# Patient Record
Sex: Female | Born: 1935
Health system: Southern US, Community
[De-identification: ages and names within clinical notes are randomized; demographics above are authoritative.]

## PROBLEM LIST (undated history)

## (undated) DIAGNOSIS — I4891 Unspecified atrial fibrillation: Secondary | ICD-10-CM

## (undated) DIAGNOSIS — J449 Chronic obstructive pulmonary disease, unspecified: Secondary | ICD-10-CM

## (undated) DIAGNOSIS — E785 Hyperlipidemia, unspecified: Secondary | ICD-10-CM

## (undated) DIAGNOSIS — I517 Cardiomegaly: Secondary | ICD-10-CM

## (undated) DIAGNOSIS — I050 Rheumatic mitral stenosis: Secondary | ICD-10-CM

## (undated) DIAGNOSIS — K219 Gastro-esophageal reflux disease without esophagitis: Secondary | ICD-10-CM

## (undated) DIAGNOSIS — I700 Atherosclerosis of aorta: Secondary | ICD-10-CM

## (undated) DIAGNOSIS — T7840XA Allergy, unspecified, initial encounter: Secondary | ICD-10-CM

## (undated) DIAGNOSIS — C801 Malignant (primary) neoplasm, unspecified: Secondary | ICD-10-CM

## (undated) DIAGNOSIS — I5189 Other ill-defined heart diseases: Secondary | ICD-10-CM

## (undated) DIAGNOSIS — L4050 Arthropathic psoriasis, unspecified: Secondary | ICD-10-CM

## (undated) DIAGNOSIS — R110 Nausea: Secondary | ICD-10-CM

## (undated) DIAGNOSIS — D6869 Other thrombophilia: Secondary | ICD-10-CM

## (undated) DIAGNOSIS — I10 Essential (primary) hypertension: Secondary | ICD-10-CM

## (undated) DIAGNOSIS — J479 Bronchiectasis, uncomplicated: Secondary | ICD-10-CM

## (undated) DIAGNOSIS — I272 Pulmonary hypertension, unspecified: Secondary | ICD-10-CM

## (undated) HISTORY — DX: Allergy, unspecified, initial encounter: T78.40XA

## (undated) HISTORY — PX: PARTIAL HYSTERECTOMY: SHX80

## (undated) HISTORY — PX: CHOLECYSTECTOMY: SHX55

---

## 2006-03-07 HISTORY — PX: BREAST LUMPECTOMY: SHX2

## 2008-10-07 ENCOUNTER — Ambulatory Visit: Payer: Self-pay | Admitting: Dermatology

## 2008-10-28 ENCOUNTER — Ambulatory Visit: Payer: Self-pay | Admitting: Dermatology

## 2008-12-05 ENCOUNTER — Ambulatory Visit: Payer: Self-pay | Admitting: Dermatology

## 2010-12-14 ENCOUNTER — Ambulatory Visit: Payer: Self-pay | Admitting: Internal Medicine

## 2012-12-23 ENCOUNTER — Inpatient Hospital Stay: Payer: Self-pay | Admitting: Family Medicine

## 2012-12-23 LAB — BASIC METABOLIC PANEL
Anion Gap: 6 — ABNORMAL LOW (ref 7–16)
BUN: 23 mg/dL — ABNORMAL HIGH (ref 7–18)
Calcium, Total: 8.2 mg/dL — ABNORMAL LOW (ref 8.5–10.1)
Co2: 25 mmol/L (ref 21–32)
Creatinine: 0.73 mg/dL (ref 0.60–1.30)
EGFR (African American): >60
EGFR (Non-African Amer.): >60
Osmolality: 283 (ref 275–301)
Potassium: 3.5 mmol/L (ref 3.5–5.1)
Sodium: 140 mmol/L (ref 136–145)

## 2012-12-23 LAB — CBC
HCT: 37.1 % (ref 35.0–47.0)
HGB: 12.7 g/dL (ref 12.0–16.0)
MCH: 29.6 pg (ref 26.0–34.0)
MCV: 87 fL (ref 80–100)
Platelet: 231 10*3/uL (ref 150–440)

## 2012-12-23 LAB — TROPONIN I: Troponin-I: <0.02 ng/mL

## 2012-12-24 LAB — BASIC METABOLIC PANEL
Anion Gap: 8 (ref 7–16)
Calcium, Total: 8.2 mg/dL — ABNORMAL LOW (ref 8.5–10.1)
Chloride: 106 mmol/L (ref 98–107)
Creatinine: 0.77 mg/dL (ref 0.60–1.30)
EGFR (African American): >60
EGFR (Non-African Amer.): >60
Glucose: 95 mg/dL (ref 65–99)
Osmolality: 275 (ref 275–301)
Sodium: 137 mmol/L (ref 136–145)

## 2012-12-24 LAB — CBC WITH DIFFERENTIAL/PLATELET
Basophil #: 0 10*3/uL (ref 0.0–0.1)
Lymphocyte #: 1.5 10*3/uL (ref 1.0–3.6)
Lymphocyte %: 8.3 %
MCHC: 33.9 g/dL (ref 32.0–36.0)
Monocyte %: 10.1 %
Neutrophil #: 15.1 10*3/uL — ABNORMAL HIGH (ref 1.4–6.5)
Neutrophil %: 81 %
Platelet: 241 10*3/uL (ref 150–440)
WBC: 18.6 10*3/uL — ABNORMAL HIGH (ref 3.6–11.0)

## 2012-12-27 DIAGNOSIS — I059 Rheumatic mitral valve disease, unspecified: Secondary | ICD-10-CM

## 2012-12-27 LAB — VANCOMYCIN, TROUGH: Vancomycin, Trough: 4 ug/mL — ABNORMAL LOW (ref 10–20)

## 2012-12-27 LAB — CREATININE, SERUM
Creatinine: 0.8 mg/dL (ref 0.60–1.30)
EGFR (Non-African Amer.): >60

## 2012-12-28 LAB — CULTURE, BLOOD (SINGLE)

## 2012-12-29 LAB — WBC: WBC: 16.7 10*3/uL — ABNORMAL HIGH (ref 3.6–11.0)

## 2012-12-29 LAB — CREATININE, SERUM
EGFR (African American): >60
EGFR (Non-African Amer.): >60

## 2012-12-29 LAB — VANCOMYCIN, TROUGH: Vancomycin, Trough: 3 ug/mL — ABNORMAL LOW (ref 10–20)

## 2012-12-30 LAB — BASIC METABOLIC PANEL
BUN: 14 mg/dL (ref 7–18)
Calcium, Total: 8.2 mg/dL — ABNORMAL LOW (ref 8.5–10.1)
Chloride: 99 mmol/L (ref 98–107)
Co2: 32 mmol/L (ref 21–32)
Creatinine: 0.74 mg/dL (ref 0.60–1.30)
EGFR (African American): >60
EGFR (Non-African Amer.): >60
Glucose: 119 mg/dL — ABNORMAL HIGH (ref 65–99)
Sodium: 136 mmol/L (ref 136–145)

## 2012-12-30 LAB — CBC WITH DIFFERENTIAL/PLATELET
Basophil #: 0 10*3/uL (ref 0.0–0.1)
Basophil %: 0.3 %
Eosinophil #: 0.4 10*3/uL (ref 0.0–0.7)
Eosinophil %: 2.6 %
HCT: 29.2 % — ABNORMAL LOW (ref 35.0–47.0)
HGB: 9.9 g/dL — ABNORMAL LOW (ref 12.0–16.0)
Lymphocyte #: 1.2 10*3/uL (ref 1.0–3.6)
Lymphocyte %: 7.9 %
MCH: 29.3 pg (ref 26.0–34.0)
MCHC: 33.7 g/dL (ref 32.0–36.0)
MCV: 87 fL (ref 80–100)
Monocyte #: 1 x10 3/mm — ABNORMAL HIGH (ref 0.2–0.9)
Monocyte %: 6.9 %
Neutrophil #: 12 10*3/uL — ABNORMAL HIGH (ref 1.4–6.5)
Neutrophil %: 82.3 %
Platelet: 352 10*3/uL (ref 150–440)
RBC: 3.36 10*6/uL — ABNORMAL LOW (ref 3.80–5.20)
RDW: 13.6 % (ref 11.5–14.5)
WBC: 14.6 10*3/uL — ABNORMAL HIGH (ref 3.6–11.0)

## 2012-12-30 LAB — VANCOMYCIN, TROUGH: Vancomycin, Trough: 9 ug/mL — ABNORMAL LOW (ref 10–20)

## 2012-12-31 LAB — CREATININE, SERUM
Creatinine: 0.81 mg/dL (ref 0.60–1.30)
EGFR (Non-African Amer.): >60

## 2013-01-01 LAB — CBC WITH DIFFERENTIAL/PLATELET
Basophil %: 0.6 %
Eosinophil %: 4.6 %
Lymphocyte %: 14.7 %
MCHC: 34.3 g/dL (ref 32.0–36.0)
Monocyte %: 6.6 %
Neutrophil #: 9.5 10*3/uL — ABNORMAL HIGH (ref 1.4–6.5)
Neutrophil %: 73.5 %
Platelet: 456 10*3/uL — ABNORMAL HIGH (ref 150–440)
RDW: 14 % (ref 11.5–14.5)
WBC: 12.9 10*3/uL — ABNORMAL HIGH (ref 3.6–11.0)

## 2013-01-01 LAB — BASIC METABOLIC PANEL
Anion Gap: 5 — ABNORMAL LOW (ref 7–16)
BUN: 14 mg/dL (ref 7–18)
Calcium, Total: 8.8 mg/dL (ref 8.5–10.1)
Chloride: 103 mmol/L (ref 98–107)
Co2: 31 mmol/L (ref 21–32)
Creatinine: 0.76 mg/dL (ref 0.60–1.30)
EGFR (African American): >60
Osmolality: 278 (ref 275–301)
Sodium: 139 mmol/L (ref 136–145)

## 2013-01-01 LAB — PRO B NATRIURETIC PEPTIDE: B-Type Natriuretic Peptide: 534 pg/mL — ABNORMAL HIGH (ref 0–450)

## 2013-01-04 ENCOUNTER — Observation Stay: Payer: Self-pay | Admitting: Internal Medicine

## 2013-01-04 LAB — CK TOTAL AND CKMB (NOT AT ARMC)
CK, Total: 66 U/L (ref 21–215)
CK-MB: 1.2 ng/mL (ref 0.5–3.6)

## 2013-01-04 LAB — COMPREHENSIVE METABOLIC PANEL
Albumin: 3 g/dL — ABNORMAL LOW (ref 3.4–5.0)
Bilirubin,Total: 0.4 mg/dL (ref 0.2–1.0)
Calcium, Total: 9.2 mg/dL (ref 8.5–10.1)
Chloride: 105 mmol/L (ref 98–107)
Co2: 24 mmol/L (ref 21–32)
Creatinine: 0.72 mg/dL (ref 0.60–1.30)
Osmolality: 275 (ref 275–301)
SGOT(AST): 67 U/L — ABNORMAL HIGH (ref 15–37)
Sodium: 135 mmol/L — ABNORMAL LOW (ref 136–145)
Total Protein: 8.6 g/dL — ABNORMAL HIGH (ref 6.4–8.2)

## 2013-01-04 LAB — CBC
HCT: 40.8 % (ref 35.0–47.0)
HGB: 13.7 g/dL (ref 12.0–16.0)
MCV: 87 fL (ref 80–100)
RBC: 4.71 10*6/uL (ref 3.80–5.20)
RDW: 13.9 % (ref 11.5–14.5)
WBC: 15.3 10*3/uL — ABNORMAL HIGH (ref 3.6–11.0)

## 2013-01-05 LAB — CBC WITH DIFFERENTIAL/PLATELET
Basophil %: 0.7 %
Eosinophil %: 1.9 %
HCT: 35.8 % (ref 35.0–47.0)
HGB: 11.8 g/dL — ABNORMAL LOW (ref 12.0–16.0)
Lymphocyte #: 2.5 10*3/uL (ref 1.0–3.6)
MCH: 28.7 pg (ref 26.0–34.0)
MCHC: 33 g/dL (ref 32.0–36.0)
MCV: 87 fL (ref 80–100)
Monocyte #: 0.8 x10 3/mm (ref 0.2–0.9)
Neutrophil #: 8.6 10*3/uL — ABNORMAL HIGH (ref 1.4–6.5)
Neutrophil %: 69.8 %
RBC: 4.11 10*6/uL (ref 3.80–5.20)
WBC: 12.3 10*3/uL — ABNORMAL HIGH (ref 3.6–11.0)

## 2013-01-05 LAB — BASIC METABOLIC PANEL
Anion Gap: 8 (ref 7–16)
BUN: 25 mg/dL — ABNORMAL HIGH (ref 7–18)
Calcium, Total: 8.5 mg/dL (ref 8.5–10.1)
Chloride: 109 mmol/L — ABNORMAL HIGH (ref 98–107)
Co2: 24 mmol/L (ref 21–32)
EGFR (Non-African Amer.): >60
Potassium: 3.4 mmol/L — ABNORMAL LOW (ref 3.5–5.1)
Sodium: 141 mmol/L (ref 136–145)

## 2013-01-05 LAB — MAGNESIUM: Magnesium: 1.9 mg/dL

## 2013-01-06 LAB — CLOSTRIDIUM DIFFICILE BY PCR

## 2013-03-05 ENCOUNTER — Ambulatory Visit: Payer: Self-pay | Admitting: Internal Medicine

## 2013-08-05 LAB — HM MAMMOGRAPHY: HM MAMMO: NORMAL

## 2013-09-24 DIAGNOSIS — J479 Bronchiectasis, uncomplicated: Secondary | ICD-10-CM | POA: Insufficient documentation

## 2013-10-07 ENCOUNTER — Observation Stay: Payer: Self-pay | Admitting: Internal Medicine

## 2013-10-07 LAB — CBC
HCT: 43.9 % (ref 35.0–47.0)
HGB: 14.4 g/dL (ref 12.0–16.0)
MCH: 29.4 pg (ref 26.0–34.0)
MCHC: 32.7 g/dL (ref 32.0–36.0)
MCV: 90 fL (ref 80–100)
Platelet: 195 10*3/uL (ref 150–440)
RBC: 4.88 10*6/uL (ref 3.80–5.20)
RDW: 13.4 % (ref 11.5–14.5)
WBC: 10.8 10*3/uL (ref 3.6–11.0)

## 2013-10-07 LAB — PROTIME-INR
INR: 1
Prothrombin Time: 12.6 secs (ref 11.5–14.7)

## 2013-10-07 LAB — BASIC METABOLIC PANEL
Anion Gap: 8 (ref 7–16)
BUN: 19 mg/dL — ABNORMAL HIGH (ref 7–18)
CALCIUM: 8.9 mg/dL (ref 8.5–10.1)
Chloride: 106 mmol/L (ref 98–107)
Co2: 26 mmol/L (ref 21–32)
Creatinine: 0.69 mg/dL (ref 0.60–1.30)
Glucose: 117 mg/dL — ABNORMAL HIGH (ref 65–99)
Osmolality: 283 (ref 275–301)
POTASSIUM: 4.2 mmol/L (ref 3.5–5.1)
SODIUM: 140 mmol/L (ref 136–145)

## 2013-10-07 LAB — TROPONIN I: Troponin-I: <0.02 ng/mL

## 2013-10-08 DIAGNOSIS — R079 Chest pain, unspecified: Secondary | ICD-10-CM

## 2013-10-08 LAB — LIPID PANEL
Cholesterol: 280 mg/dL — ABNORMAL HIGH (ref 0–200)
HDL Cholesterol: 106 mg/dL — ABNORMAL HIGH (ref 40–60)
Ldl Cholesterol, Calc: 152 mg/dL — ABNORMAL HIGH (ref 0–100)
Triglycerides: 112 mg/dL (ref 0–200)
VLDL Cholesterol, Calc: 22 mg/dL (ref 5–40)

## 2013-10-09 ENCOUNTER — Encounter: Payer: Self-pay | Admitting: Cardiovascular Disease

## 2013-11-22 LAB — LIPID PANEL
CHOLESTEROL: 304 mg/dL — AB (ref 0–200)
HDL: 100 mg/dL — AB (ref 35–70)

## 2013-11-22 LAB — TSH: TSH: 3.5 u[IU]/mL (ref <=5.90)

## 2014-06-27 NOTE — H&P (Signed)
PATIENT NAMESHATARIA, [name] MR#:  [id number] DATE OF BIRTH:  06-Aug-1933  DATE OF ADMISSION:  12/23/2012  REFERRING PHYSICIAN: Dr. Benjaman Lobe.   FAMILY PHYSICIAN: Dr. Army Melia.   REASON FOR ADMISSION: Pneumonia.   HISTORY OF PRESENT ILLNESS: The patient is a 81 year old female with a history of breast cancer status post lumpectomy, benign hypertension and psoriasis. Presents to the Emergency Room with a 4 to 5 day history of worsening shortness of breath, malaise and dry nonproductive cough. Has had fevers. Was brought to the Emergency Room where she was found to be febrile and hypoxic. Chest x-ray did reveal a right middle lobe infiltrate consistent with pneumonia. She is now admitted for further evaluation.   PAST MEDICAL HISTORY: 1.  Breast cancer, status post lobectomy with adjuvant chemotherapy and radiation therapy.  2.  Environmental allergies.  3.  Psoriasis.  4.  Menopausal syndrome.   MEDICATIONS ON ADMISSION:  1.  Clonidine 0.1 mg p.o. daily.  2.  Zyrtec 10 mg p.o. daily.  3.  Flonase 2 puffs in each nostril daily.   ALLERGIES: No known drug allergies.   SOCIAL HISTORY: Negative for alcohol or tobacco abuse.   FAMILY HISTORY: Positive for breast cancer, hypertension and stroke.   REVIEW OF SYSTEMS:    CONSTITUTIONAL: The patient has had fever but no change in weight.  EYES: No blurred or double vision. No glaucoma.  EARS, NOSE, THROAT: No tinnitus or hearing loss. No nasal discharge or bleeding. No difficulty swallowing.  RESPIRATORY: The patient has had cough but no wheezing. Denies hemoptysis. No painful respiration.  CARDIOVASCULAR: No chest pain or orthopnea. No palpitations or syncope.  GASTROINTESTINAL: Some nausea but no vomiting or diarrhea. No abdominal pain. No change in bowel habits.  GENITOURINARY: No dysuria or hematuria. No incontinence.  ENDOCRINE: No polyuria or polydipsia. No heat or cold intolerance.  HEMATOLOGIC: The patient denies anemia, easy  bruising or bleeding.  LYMPHATIC: No swollen glands.  MUSCULOSKELETAL: The patient denies pain in her neck, back, shoulders, knees or hips. No gout.  NEUROLOGIC: No numbness or migraines. Denies stroke or seizures.  PSYCHIATRIC: The patient denies anxiety, insomnia or depression.   PHYSICAL EXAMINATION: GENERAL: The patient is in no acute distress.  VITAL SIGNS: Remarkable for a blood pressure of 174/72 with a heart rate of 114, a respiratory rate of 18 and temperature 98.5.  HEENT: Normocephalic, atraumatic. Pupils are equal, round and reactive to light and accommodation. Extraocular movements are intact. Sclerae are anicteric. Conjunctivae are clear. Oropharynx is dry but clear.  NECK: Supple without JVD or bruits. No adenopathy or thyromegaly is noted.  LUNGS: Revealed diffuse rhonchi with occasional wheeze. No dullness. Respiratory effort is normal.  CARDIAC: Rapid rate with a regular rhythm. Normal S1 and S2. No significant rubs, murmurs or gallops. PMI is nondisplaced. Chest wall is nontender.  ABDOMEN: Soft, nontender, with normoactive bowel sounds. No organomegaly or masses were appreciated. No hernias or bruits were noted.  EXTREMITIES: Without clubbing, cyanosis or edema. Pulses were 2+ bilaterally.  SKIN: Warm and dry without rash or lesions.  NEUROLOGIC: Cranial nerves II through XII grossly intact. Deep tendon reflexes were symmetric. Motor and sensory exams nonfocal.  PSYCHIATRIC: Revealed a patient who was alert and oriented to person, place and time. She was cooperative and used good judgment.   LABORATORY DATA: Chest x-ray revealed right middle lobe infiltrate consistent with pneumonia. EKG revealed sinus tachycardia with no acute ischemic changes. Troponin was less than 0.02. Glucose 99 with a  BUN of 23 and a creatinine of 0.73 with a sodium of 140 and potassium of 3.5. White count was 20.5 with a hemoglobin of 12.7. D-dimer was elevated at 2.66.   ASSESSMENT: 1.  Right middle  lobe pneumonia.  2.  Acute respiratory distress.  3.  Dehydration.  4.  Benign hypertension.  5.  History of breast cancer.  6.  Elevated D-dimer of unclear significance.   PLAN: The patient will be admitted to the floor with oxygen, SVNs, IV antibiotics and IV fluids. We will begin Lovenox empirically. We will attempt CT of the chest tomorrow after hydration to rule out PE and to confirm the diagnosis of pneumonia. We will use clonidine for blood pressure control. Follow up routine labs in the morning. We will supplement oxygen at this time and wean as tolerated. Soft diet for now. Vitals q.4 hours. Further treatment and evaluation will depend upon the patient's progress.   Total time spent on this patient was 50 minutes.    ____________________________ Leonie Douglas Doy Hutching, MD jds:jm D: 12/23/2012 15:18:31 ET T: 12/23/2012 15:45:16 ET JOB#: [id number]  cc: Leonie Douglas. Doy Hutching, MD, <Dictator> Halina Maidens, MD Jb Dulworth Lennice Sites MD ELECTRONICALLY SIGNED 12/24/2012 8:14

## 2014-06-27 NOTE — Discharge Summary (Signed)
PATIENT NAMESIVAN, [name] MR#:  [id number] DATE OF BIRTH:  Dec 27, 1933  DATE OF ADMISSION:  12/23/2012 DATE OF DISCHARGE:  01/01/2013  REASON FOR ADMISSION: Pneumonia.   DISCHARGE DIAGNOSES: 1. Acute respiratory failure.  2. Bacterial community-acquired pneumonia.  3. Leukocytosis with systemic inflammatory response syndrome present at admission with evidence of tachycardia, leukocytosis and tachypnea.  4. Hypertension.  5. Diastolic dysfunction with compensated diastolic heart failure. No signs of exacerbation.  6. Pulmonary hypertension.  7. History of breast cancer status post lumpectomy.  8. History of psoriasis.   9. Possible undiagnosed chronic obstructive pulmonary disease.   IMPORTANT TESTS: CT scan of the chest with contrast to evaluate for pulmonary embolism showed no signs of pulmonary embolism, but findings concerning for multifocal bilateral pneumonia with significant mediastinal adenopathy and bilateral pleural effusions.   Echocardiogram shows an ejection fraction of 55% to 60%, normal ventricular systolic function, mild LVH, moderate elevation of pulmonary artery systolic pressures.   Other labs: On admission, white blood count 20,000, discharge white blood count 12,000. Creatinine 0.33, BUN 23. Hemoglobin 10.4 at discharge, platelets 456. Legionella antigen negative. Mycoplasma antigen negative. D- dimer of 2.66.   EKG: Sinus tachycardia.  Blood cultures negative x 2. Unable to get sputum cultures on this patient.   DISPOSITION: Home with home health.   MEDICATIONS AT DISCHARGE: Flonase 50 mcg inhaled once a day, Zyrtec 10 mg once a day, clonidine 0.1 mg once daily, aspirin 81 mg once a day, budesonide with  formoterol 160 mcg 4.5 mcg twice daily, Combivent as needed for shortness of breath, amlodipine 5 mg once a day, guaifenesin 1200 mg 2 times daily, linezolid 600 mg twice daily, Levaquin 500 mg once a day, Tussionex twice daily for cough, Augmentin 1 twice daily  for 7 days, Diflucan 150 mg daily for 2 days, Nystatin topical and oral mouthwash.   Follow-up with Dr. Halina Maidens in the next 7 days.   Message for Dr. Army Melia: Please repeat a CT scan of the chest after resolution of pneumonia as the patient had findings of several mediastinal lymphadenopathies and bilateral infiltrates, just on the setting of a history of breast cancer to rule out the possibility of metastatic disease.   The patient has improved with her pneumonia, but she had a very rocky course with very slow progression, for what we want to make sure that she gets follow up on her CT scan within at least a couple of weeks to a month.   HOSPITAL COURSE:  Very nice 81 year old female who has history of previous breast cancer, environmental allergies, psoriasis, who comes in with a history of 4 or 5 days of worsening shortness of breath, malaise, dry nonproductive cough and fevers. She was febrile and hypoxic at admission, and a chest x-ray revealed right middle lobe infiltrate consistent with pneumonia for which the patient was admitted and placed on empiric antibiotics for the treatment of pneumonia. The patient also had elevated d-dimer for what a CT scan was done. As mentioned above, the CT scan showed bilateral multifocal pneumonia. The patient was very symptomatic and had a very slow progression. She was admitted on 10/19 and discharged the 10/28. Since there was no significant improvement by day 4 of her hospitalization, the antibiotic management was changed to Zosyn, vancomycin and Levaquin to cover for atypical bacteria, gram negatives and possible MRSA.   This patient was really sick with significant changes on her CT scan concerning for septic emboli, but no other source of  sepsis or the pneumonia.   Pulmonary was involved. Dr. Humphrey Rolls, who evaluated the patient, recommended to do a bronch if no significant improvement. The patient continued to improve very slowly after changing  antibiotics and by the day of discharge, 10/28, we were able to get her on room air. The patient had significant trouble with her secretions, not able to expectorate, for what we were not able to get sputum cultures. The concern in this patient is her previous history of breast cancer and the possibility of metastatic disease. The first impression of the CT scan was mostly correlation with infectious process, but we recommended follow-up CT scan in the next couple of weeks to a month to evaluate the previous changes in the lymphadenopathies. The patient will follow with Dr. Army Melia to get that done.    As far as her other medical problems, the patient had high blood pressure, for what she was continued on clonidine and amlodipine. She has history of diastolic dysfunction per echo, but no signs of pulmonary congestion or pulmonary edema. She had diastolic dysfunction, but no significant exacerbation with CHF. Everything was compensated. She also has pulmonary hypertension which is likely secondary to her lung process. Unable to say if she has COPD; she was never a smoker but she had significant allergies and maybe some asthma.   Other than that, the patient did well during this hospitalization and at the time of discharge she is in good condition, but still a little bit weak for what we recommended home health with PT. The patient is discharged in good condition.   TIME OF DISCHARGE: 45 minutes.   ____________________________ [place] Sink, MD rsg:sg D: 01/02/2013 07:06:00 ET T: 01/02/2013 07:54:36 ET JOB#: [id number]  cc: [place] Sink, MD, <Dictator> Halina Maidens, MD Cristi Loron MD ELECTRONICALLY SIGNED 01/18/2013 23:40

## 2014-06-27 NOTE — Discharge Summary (Signed)
PATIENT NAMELILLIAUNA, Murphy MR#:  [id number] DATE OF BIRTH:  21-Feb-1934  DATE OF ADMISSION:  01/04/2013 DATE OF DISCHARGE:  01/07/2013  PRESENTING COMPLAINT: Nausea, vomiting and diarrhea.   DISCHARGE DIAGNOSES: 1.  Nausea and diarrhea suspected due to multiple antibiotics, improving. 2.  Dehydration, resolved.  3.  Recent treatment for bilateral pneumonia, improved.  4.  Hypertension.   CONDITION ON DISCHARGE: Fair.   CODE STATUS: FULL CODE.   DISCHARGE MEDICATIONS: 1.  Flonase 50 mcg/inhalation 1 spray daily.  2.  Zyrtec 10 mg daily.  3.  Aspirin 81 mg daily.  4.  Combivent 2 puffs 4 times a day as needed.  5.  Amlodipine 10 mg daily.  6.  Guaifenesin 200 mg extended-release 1 tablet b.i.d. as needed.  7.  Nystatin swish and swallow 5 mL 4 times a day as needed.  8.  Symbicort 160/4.5 one puff b.i.d.  9.  Clonidine 0.1 mg p.o. daily.  10.  Promethazine 12.5 mg 3 times a day as needed for nausea.  11.  Lactobacillus 1 capsule t.i.d. for 5 to 6 days as needed.  12.  Tussionex 5 mL b.i.d.   DISCHARGE INSTRUCTIONS: Follow up with Dr. Halina Maidens, primary care physician.  DIET: Regular.   LABORATORY AND DIAGNOSTICS: C. diff negative. White count 12.3, H and H 11.8 and 35.8 and platelet count 605. BUN 25, creatinine 0.5, sodium 141 and potassium 3.4. Magnesium 1.9.   Chest x-ray shows improvement in pneumonia.   Cardiac enzymes negative.   BRIEF SUMMARY OF HOSPITAL COURSE: [name] is a 81 year old Caucasian female with history of hypertension, breast cancer and recent admission for pneumonia and possible COPD secondary to environmental allergies and maybe some asthma who came in after she was discharged 3 days ago, presented with nausea, vomiting and debility. She was admitted with:  1.  Dehydration suspected due to poor p.o. intake and secondary probably from side effects from the antibiotics. The patient received IV fluids. BUN and creatinine remained stable. Her  pneumonia showed improvement on chest x-ray, she was asymptomatic, and hence her antibiotics were discontinued. She is tolerating p.o. clear liquid diet and now she is on regular diet which she is tolerating well.  2.  Nausea. Could be secondary to side effects from antibiotics. Since the patient is stable, we stopped antibiotics. 3.  Pneumonia, bilateral, resolving.  4.  History of breast cancer. The patient had lymphadenopathy on CT scan which was done prior to admission. She will follow up with her primary care physician to ensure clearing of the lymphadenopathy down the road. We will defer this to Dr. Halina Maidens.  5.  Hypertension. Continue Norvasc and clonidine.  6.  Physical therapy. The patient did very well, does not need any PT at home.   DISPOSITION: She is discharged in a stable to home.   TIME SPENT: 40 minutes. ____________________________ Hart Rochester Posey Pronto, MD sap:sb D: 01/07/2013 14:01:22 ET T: 01/07/2013 14:31:16 ET JOB#: [id number]  cc: Monicka Cyran A. Posey Pronto, MD, <Dictator> Halina Maidens, MD Ilda Basset MD ELECTRONICALLY SIGNED 01/10/2013 13:31

## 2014-06-27 NOTE — H&P (Signed)
PATIENT NAME:  [name], [name] MR#:  [id number] DATE OF BIRTH:  03-Mar-1934  DATE OF ADMISSION:  01/04/2013  REFERRING PHYSICIAN: Francene Castle, MD  PRIMARY CARE PHYSICIAN: Halina Maidens, MD  CHIEF COMPLAINT: Weakness, nausea, vomiting, dehydration.   HISTORY OF PRESENT ILLNESS: [name] is a very nice 81 year old female who has history of previous breast cancer, environmental allergies and psoriasis came in with a history of 4 or 5 days of shortness of breath and was admitted on 12/23/2012 and discharged on 12/22/2012 with the diagnosis of acute respiratory failure and bacterial community-acquired pneumonia. The patient had a really rocky course with very difficult treatment, no improvement for 4 or 5 days, actually she was getting worse, for what her antibiotics were brought up to include MRSA coverage. We were never able to get a sputum culture on her as she was unable to expectorate for what she was treated empirically with linezolid as an outpatient. The patient had x-rays that showed right middle lobe pneumonia and a CT scan that showed bilateral multifocal pneumonia with multiple lymph nodes that could be acute or chronic. As a side note from that, there was a recommendation to follow up on the CT scan in the near future in the setting of the patient having history of breast cancer. The patient went home and she did really well on the 28th and the 29th, but then on the 30th she started getting nauseous and weak. So she comes back today because she is not feeling very well. She feels really weak. She cannot eat anything. She cannot hold anything down because of her nausea, but she vomited a couple of times. She looks dehydrated. She says that her breathing is normal now and her actual chest x-ray now looks significantly improved from the last one for what her pneumonia is pretty much resolving. She denies any significant diarrhea, but she had some loose stool last night. The patient is admitted  as an observation due to her dehydration and a possibility of having her placed in a skilled nursing facility for rehab as she feels so weak. She had home health therapy scheduled at home, but she declined to continue as the patient was walking around very well.   REVIEW OF SYSTEMS:  A 12 system review is done.  CONSTITUTIONAL: No fever. No chills. Positive fatigue. Positive weakness. No significant weight loss or weight gain.  EYES: No blurry vision, double vision.  EARS, NOSE, THROAT: No tinnitus. No difficulty swallowing.  RESPIRATORY: No cough. No wheezing. No shortness of breath. No painful respirations. Her pneumonia is pretty much resolved. CARDIOVASCULAR: No chest pain, orthopnea, edema or syncope.  GASTROINTESTINAL: Positive nausea. Positive vomiting, once or twice. No abdominal pain. No hematemesis. No jaundice. No significant diarrhea but a couple of loose stools last night. GENITOURINARY: No dysuria, hematuria, changes in frequency.  GYNECOLOGIC: No breast masses. ENDOCRINE: No polyuria, polydipsia, polyphagia, cold or heat intolerance.   HEMATOLOGIC AND LYMPHATIC: No anemia, easy bruising or swollen glands.  SKIN: No rashes or petechiae.  MUSCULOSKELETAL: Positive for generalized weakness. No joint pain. No gout.  NEUROLOGIC: No numbness. No tingling. No CVA.  PSYCHIATRIC: The patient denies depression, but she does look depressed to me. No insomnia.   PAST MEDICAL HISTORY:  1.  Breast cancer status post lumpectomy with chemotherapy and radiation.  2.  Seasonal and environmental allergies.  3.  Psoriasis.  4.  Menopause.   ALLERGIES: No known drug allergies.  SOCIAL HISTORY: The patient has never  smoked, but she has been in contact with smokers in the past.  No alcohol. She lives with her husband.   FAMILY HISTORY: Positive for breast cancer, hypertension and stroke in her parents.   PAST SURGICAL HISTORY:  Lumpectomy due to breast cancer. Denies any others.  CURRENT  MEDICATIONS:  1.  Zyrtec 10 mg daily. 2.  Symbicort 160/4.5 mg twice daily. 3.  Nystatin cream and powder. 4.  Linezolid 600 mg twice daily. 5.  Levaquin 500 mg once daily. 6.  Flonase nasal spray. 7.  Combivent 2 puffs 4 times a day as needed for shortness of breath.  8.  Clonidine 0.1 mg daily. 9.  Aspirin 81 mg daily. 10.  Augmentin 1 tablet twice daily. 11.  Amlodipine 5 mg once a day   PHYSICAL EXAMINATION: VITAL SIGNS: Blood pressure 175/65, pulse 93, respirations 18, temperature 97.8, oxygen saturation 94% on room air.  GENERAL: The patient is alert and oriented x 3, in no acute distress, but she looks weak, debilitated. She looks chronically ill, dehydrated.  ENT: Normocephalic, atraumatic. Pupils are equal and reactive. Extraocular movements intact. Mucosa are moist. Anicteric sclerae. Pink conjunctivae. No oral lesions. No oropharyngeal exudates.  NECK: Supple. No JVD. No thyromegaly. No adenopathy. No carotid bruits.  LUNGS: Diffuse crackles on bottom of the lung likely secondary to resolving pneumonia. No signs of consolidation. No use of accessory muscles.  HEART: Regular rate and rhythm. No murmurs, rubs or gallops. No displacement of PMI. No  tenderness to chest wall.  ABDOMEN: Soft, nontender, nondistended. No hepatosplenomegaly. No masses. Bowel sounds are positive.  EXTREMITIES: No edema, cyanosis or clubbing. Pulses +2. Capillary refill less than 3. SKIN: Warm, decreased turgor. No rashes.  NEUROLOGIC: Cranial nerves II through XII intact. No focal findings. Strength is 5/5 in all 4 extremities.  PSYCHIATRIC: Alert and oriented x 3, in no acute distress. No agitation.  MUSCULOSKELETAL: No significant joint effusions or swelling.  LYMPHATIC: Negative for lymphadenopathy in neck or supraclavicular area.  LABORATORY AND DIAGNOSTICS: Glucose 103, BUN 27, creatinine 0.72, sodium 135, total protein 8.6, albumin 3, AST 67. Troponin 0.02. White count is 15,000, hemoglobin 13  and platelet count 681.    Chest x-ray showed actual improvement from previous pneumonia.   EKG: Normal sinus rhythm. No ST depression or elevation. Prolonged QT, 482.   ASSESSMENT AND PLAN: A 81 year old female with history of hypertension, history of breast cancer, recent admission for pneumonia, possible chronic obstructive pulmonary disease secondary to environmental allergies and maybe some asthma. The patient was never a smoker. Lymph nodes on chest x-ray. The patient with history of lung cancer. They need to be evaluated in the future. The patient admitted after being discharged 3 days ago secondary to nausea, vomiting and debility.  1.  Dehydration. The patient was started on IV fluids, 75 mL an hour. Her BUN is 27 and her sodium is 135 which correlates with dehydration. The patient is on IV fluids. She is not eating or drinking. She had a couple of episodes of vomiting.  2.  Nausea. Could be secondary to side effects of antibiotics. The patient is on 3 different antibiotics. They were prescribed due to severe pneumonia, multifocal, that did not improve with regular antibiotics. At this moment, since the patient is stable, we are going to stop her amoxicillin and her linezolid and continue only with Levaquin. Continue to monitor.  3.  Pneumonia, bilateral. It is resolving.  4.  History of breast cancer. The patient had lymphadenopathy  on the CT scan done prior. She needs followup CT scan after resolution of pneumonia to make sure they are not chronic and they are just acute secondary to the severity of her pneumonia.  5.  Hypertension. Continue Norvasc, add hydralazine p.r.n.  6.  Other medical problems are stable. The patient is admitted for observation for possible placement and rehydration. The patient will accept to go to skilled nursing facility as she has been so weak and not ambulating well and having decreased p.o. intake.   TIME SPENT: About 50 minutes with this patient. Case discussed  with social worker and Dr. Thomasene Lot.  ____________________________ [place] Sink, MD rsg:sb D: 01/04/2013 15:21:26 ET T: 01/04/2013 16:04:39 ET JOB#: [id number]  cc: [place] Sink, MD, <Dictator> Roni Friberg America Brown MD ELECTRONICALLY SIGNED 01/23/2013 22:51

## 2014-06-28 NOTE — Discharge Summary (Signed)
PATIENT NAMETAMEEKA, [name] MR#:  [id number] DATE OF BIRTH:  21-Oct-1933  DATE OF ADMISSION:  10/07/2013 DATE OF DISCHARGE:  10/08/2013  DISCHARGE DIAGNOSES:  1.  Gastroesophageal reflux disease with burning in her chest.  2.  Hypertension.  3.  Hyperlipidemia.   IMAGING STUDIES: Include a nuclear Lexiscan stress test which showed normal EF, no ischemia.   ADMITTING HISTORY AND PHYSICAL AND HOSPITAL COURSE: Please see detailed H and P dictated by Dr. Verdell Carmine. In brief summary,  81 year old female patient was admitted to the hospital with complaints of burning in her chest with chest pressure with atypical symptoms. The patient was scheduled for a stress test. Her cardiac enzymes were normal, did not show any arrhythmias. EKG showed no acute ST-T wave changes. Lexiscan was normal. The patient was diagnosed with GERD. She has increased her PPIs to b.i.d. from once a day and is being discharged home. Presently, she is chest pain free.   PHYSICAL EXAMINATION: Prior to discharge: LUNGS: Sound clear.  HEART: Sounds are S1, S2 without any murmurs.  EXTREMITIES: No edema found.  ABDOMEN: No abdominal tenderness.   DISCHARGE MEDICATIONS:  1.  Aspirin 81 mg daily.  2.  Atorvastatin 20 mg daily.  3.  Flonase 50 mcg  5.  Hydrochlorothiazide 12.5 mg daily.  6.  Metoprolol tartrate 25 mg half a tablet daily.  7.  Protonix 20 mg daily.  8.  Zyrtec 20 mg daily.   The patient was newly diagnosed with hypertension and hyperlipidemia during the hospital stay.   DISCHARGE INSTRUCTIONS: Low-sodium, low-fat diet. Activity as tolerated. Follow up with primary care physician within a week. The patient has been instructed to not use any alcohol, tobacco, chocolate, peppermint, or caffeine to reduce symptoms of GERD.   TIME SPENT: On day of discharge in discharge activity was 40 minutes.   ____________________________ Leia Alf Sacoya Mcgourty, MD srs:tm D: 10/10/2013 12:42:49 ET T: 10/10/2013 13:53:33  ET JOB#: [id number]  cc: Alveta Heimlich R. Darvin Neighbours, MD, <Dictator> Halina Maidens, MD  Neita Carp MD ELECTRONICALLY SIGNED 10/21/2013 14:09

## 2014-06-28 NOTE — H&P (Signed)
PATIENT NAMEPALESTINE, [name] MR#:  [id number] DATE OF BIRTH:  Dec 06, 1933  DATE OF ADMISSION:  10/07/2013  PRIMARY CARE PHYSICIAN: Dr. Halina Maidens.   CHIEF COMPLAINT: Chest pain and pressure.   HISTORY OF PRESENT ILLNESS: This is a 81 year old female, who presents to the hospital complaining of chest pain and pressure that began late yesterday evening. She describes the symptoms as being this pain/pressure in the center of her chest, nonradiating, associated with some nausea. It improved over the course of the night. This morning when the patient woke up, she was still feeling quite weak and nauseated; therefore, came to the ER for further evaluation.   In the Emergency Room, the patient received some aspirin and her pain since then has pretty much resolved. The patient denies any cough, upper respiratory symptoms, any abdominal pain, any diarrhea, any sick contacts recently.   REVIEW OF SYSTEMS:  CONSTITUTIONAL: No documented fever. No weight gain, no weight loss.  EYES: No blurred or double vision.  ENT: No tinnitus. No postnasal drip. No redness of the oropharynx.  RESPIRATORY: No cough, no wheeze, no hemoptysis, no dyspnea.  CARDIOVASCULAR: Positive chest pain. No orthopnea, no palpitations, no syncope.  GASTROINTESTINAL: Positive nausea. No vomiting, diarrhea, or abdominal pain. No melena or hematochezia.  GENITOURINARY: No dysuria or hematuria.  ENDOCRINE: No polyuria, nocturia, heat or cold intolerance.  HEMATOLOGIC: No anemia, no bruising, no bleeding.  INTEGUMENTARY: No rashes. No lesions.  MUSCULOSKELETAL: No arthritis, no swelling, no gout.  NEUROLOGIC: No numbness or tingling. No ataxia. No seizure-type activity.  PSYCHIATRIC: No anxiety, no insomnia, no ADD.   PAST MEDICAL HISTORY: Consistent with psoriasis.   SOCIAL HISTORY: No smoking. No alcohol abuse. No illicit drug abuse. Lives at home with her husband.   FAMILY HISTORY: The patient's mother and father are both  deceased. Mother died from polymyositis. Father had lung cancer.   CURRENT MEDICATIONS: Flonase 1 spray to each nostril daily, Taclonex 0.064% topical ointment b.i.d., aspirin 81 mg daily, and Zyrtec 10 mg daily.   PHYSICAL EXAMINATION:  VITAL SIGNS: Temperature is 97.9, pulse 66, respirations 18, blood pressure 185/82, saturations 98% on room air.  GENERAL: She is a pleasant-appearing female in no apparent distress.  HEENT:  Atraumatic, normocephalic. Extraocular muscles are intact. Pupils equal and reactive to light. Sclerae anicteric. No conjunctival injection. No pharyngeal erythema.  NECK: Supple. There is no jugular venous distention. No bruits, no lymphadenopathy, no thyromegaly.  HEART: Regular rate and rhythm. No murmurs, no rubs, no clicks.  LUNGS: Clear to auscultation bilaterally. No rales or rhonchi. No wheezes.  ABDOMEN: Soft, flat, nontender, nondistended. Has good bowel sounds. No hepatosplenomegaly appreciated.  EXTREMITIES: No evidence of any cyanosis, clubbing, or peripheral edema. Has +2 pedal and radial pulses bilaterally.  NEUROLOGIC: The patient is alert, awake, and oriented x 3 with no focal, motor or sensory defecits bilaterally.  SKIN: Moist and warm with no rashes appreciated.  LYMPHATIC: There is no cervical or axillary lymphadenopathy.   LABORATORY DATA: Serum glucose of 117, BUN 19, creatinine 0.6, sodium 140, potassium 4.2, chloride 106, bicarbonate 26. Troponin less than 0.02. White cell count 10.8, hemoglobin 14.4, hematocrit 43.9, platelet count 195,000. INR is 1.0.   The patient did have an EKG done, which showed normal sinus rhythm with normal axis and no evidence of acute ST or T wave changes.   The patient also had a chest x-ray done, which showed no pneumothorax, pulmonary edema, no consolidation.   ASSESSMENT AND PLAN: This  is a 81 year old female with a history of psoriasis, who presents to the hospital due to chest pain and pressure.   1. Chest pain  and pressure: The patient does have some typical symptoms for angina. Therefore, I will observe her overnight on off-unit telemetry, follow serial cardiac markers. Her first set is negative. Continue aspirin, nitroglycerin, beta blocker and oxygen supplementation. Check a lipid profile in the morning. I will get a treadmill Myoview in the morning.  2. Hypertension. The patient has no previous history of hypertension, but her blood pressure is significantly uncontrolled here with systolics over 343, diastolics over 568. I will start her on some low-dose metoprolol. Also add some p.r.n. hydralazine and follow hemodynamics.  3. Psoriasis. This is currently stable. The patient is on Taclonex, but uses it on an as needed basis.   CODE STATUS: The patient is a Full Code.   TIME SPENT: 45 minutes.    ____________________________ [name] vjs:jr D: 10/07/2013 13:29:50 ET T: 10/07/2013 13:48:51 ET JOB#: [id number]  cc: [name], <Dictator> Henreitta Leber Murphy ELECTRONICALLY SIGNED 10/18/2013 14:25

## 2014-10-30 ENCOUNTER — Other Ambulatory Visit: Payer: Self-pay | Admitting: Internal Medicine

## 2014-10-30 ENCOUNTER — Encounter: Payer: Self-pay | Admitting: Internal Medicine

## 2014-10-30 DIAGNOSIS — L400 Psoriasis vulgaris: Secondary | ICD-10-CM | POA: Insufficient documentation

## 2014-10-30 DIAGNOSIS — I10 Essential (primary) hypertension: Secondary | ICD-10-CM | POA: Insufficient documentation

## 2014-10-30 DIAGNOSIS — J3089 Other allergic rhinitis: Secondary | ICD-10-CM | POA: Insufficient documentation

## 2014-10-30 DIAGNOSIS — K219 Gastro-esophageal reflux disease without esophagitis: Secondary | ICD-10-CM | POA: Insufficient documentation

## 2014-10-30 DIAGNOSIS — Z853 Personal history of malignant neoplasm of breast: Secondary | ICD-10-CM | POA: Insufficient documentation

## 2014-10-30 DIAGNOSIS — N951 Menopausal and female climacteric states: Secondary | ICD-10-CM | POA: Insufficient documentation

## 2014-10-30 DIAGNOSIS — E782 Mixed hyperlipidemia: Secondary | ICD-10-CM | POA: Insufficient documentation

## 2014-11-25 ENCOUNTER — Other Ambulatory Visit: Payer: Self-pay | Admitting: Internal Medicine

## 2014-11-25 ENCOUNTER — Ambulatory Visit (INDEPENDENT_AMBULATORY_CARE_PROVIDER_SITE_OTHER): Payer: Medicare Other | Admitting: Internal Medicine

## 2014-11-25 ENCOUNTER — Encounter: Payer: Self-pay | Admitting: Internal Medicine

## 2014-11-25 VITALS — BP 134/62 | HR 68 | Ht 64.0 in | Wt 129.2 lb

## 2014-11-25 DIAGNOSIS — E782 Mixed hyperlipidemia: Secondary | ICD-10-CM

## 2014-11-25 DIAGNOSIS — Z0000 Encounter for general adult medical examination without abnormal findings: Secondary | ICD-10-CM

## 2014-11-25 DIAGNOSIS — Z23 Encounter for immunization: Secondary | ICD-10-CM

## 2014-11-25 DIAGNOSIS — C50919 Malignant neoplasm of unspecified site of unspecified female breast: Secondary | ICD-10-CM

## 2014-11-25 DIAGNOSIS — K219 Gastro-esophageal reflux disease without esophagitis: Secondary | ICD-10-CM | POA: Diagnosis not present

## 2014-11-25 DIAGNOSIS — I10 Essential (primary) hypertension: Secondary | ICD-10-CM

## 2014-11-25 LAB — POCT URINALYSIS DIPSTICK
BILIRUBIN UA: NEGATIVE
GLUCOSE UA: NEGATIVE
Ketones, UA: NEGATIVE
Leukocytes, UA: NEGATIVE
Nitrite, UA: NEGATIVE
Protein, UA: NEGATIVE
RBC UA: NEGATIVE
SPEC GRAV UA: 1.01
Urobilinogen, UA: 0.2
pH, UA: 6

## 2014-11-25 NOTE — Progress Notes (Signed)
Patient: [name], Female    DOB: 05/12/1933, 81 y.o.   MRN: [id number] Visit Date: 11/25/2014  Today's Provider: Halina Maidens, MD   Chief Complaint  Patient presents with  . Medicare Wellness  . Hypertension  . Hyperlipidemia   Subjective:    Annual wellness visit [name] is a 81 y.o. female who presents today for her Subsequent Annual Wellness Visit. She feels fairly well. She reports exercising walking and gardening and golf twice a week. She reports she is sleeping well.   ----------------------------------------------------------- Gastrophageal Reflux She complains of abdominal pain and heartburn. She reports no chest pain, no choking, no nausea or no sore throat. Patient had a home health nurse visit and the recommended considering an alternative to pantoprazole. She's been taking Zantac as needed and that works fairly well.. This is a chronic problem. The problem occurs frequently. Nothing aggravates the symptoms. Pertinent negatives include no fatigue. She has tried a PPI and a histamine-2 antagonist for the symptoms. The treatment provided significant relief.  Hypertension This is a chronic problem. The current episode started more than 1 year ago. The problem is unchanged. The problem is controlled. Pertinent negatives include no chest pain, headaches, palpitations or shortness of breath. There are no associated agents to hypertension. The current treatment provides significant improvement. There are no compliance problems.   Hyperlipidemia This is a chronic problem. The problem is controlled. Recent lipid tests were reviewed and are normal. There are no known factors aggravating her hyperlipidemia. Pertinent negatives include no chest pain, focal weakness, leg pain, myalgias or shortness of breath. Current antihyperlipidemic treatment includes statins. The current treatment provides significant improvement of lipids. There are no compliance problems.     Review of  Systems  Constitutional: Negative for fever, activity change, fatigue and unexpected weight change.  HENT: Negative for ear pain, hearing loss, sore throat, tinnitus, trouble swallowing and voice change.   Respiratory: Negative for choking, chest tightness and shortness of breath.   Cardiovascular: Negative for chest pain, palpitations and leg swelling.  Gastrointestinal: Positive for heartburn and abdominal pain. Negative for nausea and diarrhea.       Reflux  Genitourinary: Negative for dysuria, urgency and frequency.  Musculoskeletal: Negative for myalgias and arthralgias.  Skin: Positive for rash (psoriasis much improved with methotrexate therapy). Negative for pallor.  Neurological: Negative for dizziness, tremors, focal weakness, light-headedness and headaches.  Hematological: Negative for adenopathy. Does not bruise/bleed easily.  Psychiatric/Behavioral: Negative for sleep disturbance and decreased concentration. The patient is not nervous/anxious.     Social History   Social History  . Marital Status: Married    Spouse Name: N/A  . Number of Children: N/A  . Years of Education: N/A   Occupational History  . Not on file.   Social History Main Topics  . Smoking status: Never Smoker   . Smokeless tobacco: Not on file  . Alcohol Use: 1.2 oz/week    2 Standard drinks or equivalent per week  . Drug Use: Not on file  . Sexual Activity: Not on file   Other Topics Concern  . Not on file   Social History Narrative    Patient Active Problem List   Diagnosis Date Noted  . Laboratory animal allergy 10/30/2014  . Psoriasis vulgaris 10/30/2014  . Essential (primary) hypertension 10/30/2014  . Gastro-esophageal reflux disease without esophagitis 10/30/2014  . Hot flash, menopausal 10/30/2014  . Combined fat and carbohydrate induced hyperlipemia 10/30/2014  . Primary cancer of female breast 10/30/2014  .  Abnormal chest x-ray 09/24/2013    Past Surgical History  Procedure  Laterality Date  . Cholecystectomy    . Partial hysterectomy    . Breast lumpectomy Left 2008    Her family history includes Breast cancer in her sister.    Previous Medications   ASPIRIN 81 MG CHEWABLE TABLET    Chew 1 tablet by mouth daily.   ATORVASTATIN (LIPITOR) 10 MG TABLET    Take 1 tablet by mouth daily.   CALCIPOTRIENE-BETAMETHASONE (TACLONEX) EXTERNAL SUSPENSION    TACLONEX, 0.005-0.064% (External Suspension) - Historical Medication  (0.005-0.064 %) Active Comments: Dr. Aubery Lapping   CETIRIZINE HCL (ZYRTEC ALLERGY) 10 MG CAPS    Take by mouth.   FLUTICASONE (FLONASE) 50 MCG/ACT NASAL SPRAY    Place 2 sprays into the nose daily.   FOLIC ACID (FOLVITE) 1 MG TABLET    Take 1 tablet by mouth daily.   HYDROCHLOROTHIAZIDE (MICROZIDE) 12.5 MG CAPSULE    Take 1 capsule by mouth daily.   METHOTREXATE (RHEUMATREX) 2.5 MG TABLET    Take 4 tablets by mouth once a week.   METOPROLOL TARTRATE (LOPRESSOR) 25 MG TABLET    Take 0.5 tablets by mouth daily.    Patient Care Team: Glean Hess, MD as PCP - General (Family Medicine)     Objective:   Vitals: BP 134/62 mmHg  Pulse 68  Ht 5\' 4"  (1.626 m)  Wt 129 lb 3.2 oz (58.605 kg)  BMI 22.17 kg/m2  Physical Exam  Constitutional: She is oriented to person, place, and time. She appears well-developed and well-nourished. No distress.  HENT:  Head: Normocephalic and atraumatic.  Eyes: Conjunctivae are normal. Pupils are equal, round, and reactive to light. Right eye exhibits no discharge. Left eye exhibits no discharge. No scleral icterus.  Neck: Normal range of motion. Neck supple. No thyromegaly present.  Cardiovascular: Normal rate, regular rhythm and normal heart sounds.   Pulmonary/Chest: Effort normal and breath sounds normal. No respiratory distress. She has no wheezes. Right breast exhibits no mass, no nipple discharge, no skin change and no tenderness. Left breast exhibits no mass, no nipple discharge, no skin change and no  tenderness.    Abdominal: Soft. Normal appearance and bowel sounds are normal. She exhibits no mass. There is no tenderness. There is no rebound and no guarding.  Musculoskeletal: Normal range of motion. She exhibits no edema or tenderness.  Lymphadenopathy:    She has no cervical adenopathy.    She has no axillary adenopathy.  Neurological: She is alert and oriented to person, place, and time. She has normal reflexes.  Skin: Skin is warm and dry. No rash noted.  Psychiatric: She has a normal mood and affect. Her speech is normal and behavior is normal. Thought content normal. Cognition and memory are normal.  Nursing note and vitals reviewed.   Activities of Daily Living In your present state of health, do you have any difficulty performing the following activities: 11/25/2014  Hearing? N  Vision? N  Difficulty concentrating or making decisions? N  Walking or climbing stairs? N  Dressing or bathing? N  Doing errands, shopping? N    Fall Risk Assessment Fall Risk  11/25/2014  Falls in the past year? No     Patient reports there are safety devices in place in shower at home.   Depression Screen PHQ 2/9 Scores 11/25/2014  PHQ - 2 Score 0    Cognitive Testing - 6-CIT   Correct? Score   What year is  it? yes 0 Yes = 0    No = 4  What month is it? yes 0 Yes = 0    No = 3  Remember:     [place]ia [place], [place], [place]     What time is it? yes 0 Yes = 0    No = 3  Count backwards from 20 to 1 yes 0 Correct = 0    1 error = 2   More than 1 error = 4  Say the months of the year in reverse. yes 0 Correct = 0    1 error = 2   More than 1 error = 4  What address did I ask you to remember? yes 0 Correct = 0  1 error = 2    2 error = 4    3 error = 6    4 error = 8    All wrong = 10       TOTAL SCORE  0/28   Interpretation:  Normal  Normal (0-7) Abnormal (8-28)        Assessment & Plan:     Annual Wellness Visit  Reviewed patient's Family Medical History Reviewed and  updated list of patient's medical providers Assessment of cognitive impairment was done Assessed patient's functional ability Established a written schedule for health screening [place] Completed and Reviewed  Exercise Activities and Dietary recommendations Goals    None      Immunization History  Administered Date(s) Administered  . Pneumococcal Conjugate-13 11/22/2013  . Tdap 07/06/2007    Health Maintenance  Topic Date Due  . COLONOSCOPY  10/27/1985  . ZOSTAVAX  10/28/1995  . DEXA SCAN  10/27/2000  . INFLUENZA VACCINE  10/06/2014  . PNA vac Low Risk Adult (2 of 2 - PPSV23) 11/23/2014  . TETANUS/TDAP  07/05/2017     Discussed health benefits of physical activity, and encouraged her to engage in regular exercise appropriate for her age and condition.    ------------------------------------------------------------------------------------------------------------ 1. Annual physical exam - POCT urinalysis dipstick Discontinue daily aspirin therapy   2. Essential (primary) hypertension Controlled continue current medication - CBC with Differential/Platelet - Comprehensive metabolic panel - TSH  3. Gastro-esophageal reflux disease without esophagitis Discontinue pantoprazole; begin ranitidine once daily and if symptoms recur increased to twice daily or go back to pantoprazole  CBC with Differential/Platelet  4. Combined fat and carbohydrate induced hyperlipemia Stable on statin therapy - Lipid panel  5. Primary cancer of female breast Resolved - mammogram done in July was normal Continue annual mammograms  6. Need for influenza vaccination - Flu Vaccine QUAD 36+ mos IM   Halina Maidens, MD [facility]  11/25/2014

## 2014-11-25 NOTE — Patient Instructions (Addendum)
Health Maintenance  Topic Date Due  . COLONOSCOPY  10/27/1985  . ZOSTAVAX  10/28/1995  . DEXA SCAN  10/27/2000  . INFLUENZA VACCINE  Done today  . PNA vac Low Risk Adult (2 of 2 - PPSV23) Done 2015  . TETANUS/TDAP  Due 2019   May take Zantac once or twice a day plus a pantoprazole  Can discontinue aspirin therapy - there is no obvious benefit to daily aspirin as primary prevention.

## 2014-11-27 LAB — CBC WITH DIFFERENTIAL/PLATELET
BASOS: 0 %
Basophils Absolute: 0 10*3/uL (ref 0.0–0.2)
EOS (ABSOLUTE): 0.3 10*3/uL (ref 0.0–0.4)
EOS: 3 %
HEMATOCRIT: 41.5 % (ref 34.0–46.6)
Hemoglobin: 13.8 g/dL (ref 11.1–15.9)
Immature Grans (Abs): 0 10*3/uL (ref 0.0–0.1)
Immature Granulocytes: 0 %
Lymphocytes Absolute: 2 10*3/uL (ref 0.7–3.1)
Lymphs: 20 %
MCH: 29.9 pg (ref 26.6–33.0)
MCHC: 33.3 g/dL (ref 31.5–35.7)
MCV: 90 fL (ref 79–97)
MONOS ABS: 0.8 10*3/uL (ref 0.1–0.9)
Monocytes: 8 %
NEUTROS ABS: 6.9 10*3/uL (ref 1.4–7.0)
Neutrophils: 69 %
PLATELETS: 236 10*3/uL (ref 150–379)
RBC: 4.61 x10E6/uL (ref 3.77–5.28)
RDW: 14.2 % (ref 12.3–15.4)
WBC: 10 10*3/uL (ref 3.4–10.8)

## 2014-11-27 LAB — COMPREHENSIVE METABOLIC PANEL
A/G RATIO: 1.9 (ref 1.1–2.5)
ALT: 19 IU/L (ref 0–32)
AST: 23 IU/L (ref 0–40)
Albumin: 4.5 g/dL (ref 3.5–4.8)
Alkaline Phosphatase: 75 IU/L (ref 39–117)
BILIRUBIN TOTAL: 0.5 mg/dL (ref 0.0–1.2)
BUN/Creatinine Ratio: 24 (ref 11–26)
BUN: 20 mg/dL (ref 8–27)
CALCIUM: 9.5 mg/dL (ref 8.7–10.3)
CHLORIDE: 99 mmol/L (ref 97–108)
CO2: 23 mmol/L (ref 18–29)
Creatinine, Ser: 0.82 mg/dL (ref 0.57–1.00)
GFR, EST AFRICAN AMERICAN: 79 mL/min/{1.73_m2} (ref >59)
GFR, EST NON AFRICAN AMERICAN: 68 mL/min/{1.73_m2} (ref >59)
GLOBULIN, TOTAL: 2.4 g/dL (ref 1.5–4.5)
Glucose: 94 mg/dL (ref 65–99)
POTASSIUM: 4.4 mmol/L (ref 3.5–5.2)
SODIUM: 140 mmol/L (ref 134–144)
TOTAL PROTEIN: 6.9 g/dL (ref 6.0–8.5)

## 2014-11-27 LAB — LIPID PANEL
CHOL/HDL RATIO: 2.1 ratio (ref 0.0–4.4)
Cholesterol, Total: 187 mg/dL (ref 100–199)
HDL: 91 mg/dL (ref >39)
LDL CALC: 70 mg/dL (ref 0–99)
TRIGLYCERIDES: 131 mg/dL (ref 0–149)
VLDL Cholesterol Cal: 26 mg/dL (ref 5–40)

## 2014-11-27 LAB — TSH: TSH: 4.02 u[IU]/mL (ref 0.450–4.500)

## 2015-03-10 ENCOUNTER — Other Ambulatory Visit: Payer: Self-pay | Admitting: Internal Medicine

## 2015-03-23 ENCOUNTER — Encounter: Payer: Self-pay | Admitting: Internal Medicine

## 2015-03-23 ENCOUNTER — Ambulatory Visit (INDEPENDENT_AMBULATORY_CARE_PROVIDER_SITE_OTHER): Payer: Medicare Other | Admitting: Internal Medicine

## 2015-03-23 VITALS — BP 136/68 | HR 88 | Ht 62.0 in | Wt 132.7 lb

## 2015-03-23 DIAGNOSIS — R300 Dysuria: Secondary | ICD-10-CM

## 2015-03-23 LAB — POC URINALYSIS WITH MICROSCOPIC (NON AUTO)MANUAL RESULT
Bilirubin, UA: NEGATIVE
Crystals: 0
Epithelial cells, urine per micros: 2
GLUCOSE UA: NEGATIVE
KETONES UA: NEGATIVE
Mucus, UA: 0
NITRITE UA: POSITIVE
Protein, UA: 100
RBC: 10 M/uL — AB (ref 4.04–5.48)
SPEC GRAV UA: 1.015
Urobilinogen, UA: 0.2
pH, UA: 7

## 2015-03-23 MED ORDER — CIPROFLOXACIN HCL 250 MG PO TABS: 250.0000 mg | ORAL_TABLET | Freq: Two times a day (BID) | ORAL | Status: DC

## 2015-03-23 NOTE — Progress Notes (Signed)
Date:  03/23/2015   Name:  [name]   DOB:  [birth year]   MRN:  [id number]   Chief Complaint: Urinary Tract Infection Dysuria  This is a new problem. The current episode started in the past 7 days. The problem occurs every urination. The problem has been gradually worsening. The quality of the pain is described as burning. The pain is at a severity of 1/10. The pain is mild. There has been no fever. Associated symptoms include frequency and urgency. Pertinent negatives include no chills, flank pain, hematuria, nausea or vomiting. She has tried increased fluids and NSAIDs for the symptoms. The treatment provided mild relief.    Review of Systems  Constitutional: Negative for fever, chills and fatigue.  Respiratory: Negative for chest tightness, shortness of breath and wheezing.   Cardiovascular: Negative for chest pain and palpitations.  Gastrointestinal: Negative for nausea and vomiting.  Genitourinary: Positive for dysuria, urgency and frequency. Negative for hematuria, flank pain and pelvic pain.  Musculoskeletal: Negative for back pain and arthralgias.    Patient Active Problem List   Diagnosis Date Noted  . Laboratory animal allergy 10/30/2014  . Psoriasis vulgaris 10/30/2014  . Essential (primary) hypertension 10/30/2014  . Gastro-esophageal reflux disease without esophagitis 10/30/2014  . Hot flash, menopausal 10/30/2014  . Combined fat and carbohydrate induced hyperlipemia 10/30/2014  . Primary cancer of female breast ([place]) 10/30/2014  . Abnormal chest x-ray 09/24/2013    Prior to Admission medications   Medication Sig Start Date End Date Taking? Authorizing Provider  aspirin 81 MG chewable tablet Chew 1 tablet by mouth daily.   Yes Historical Provider, MD  atorvastatin (LIPITOR) 10 MG tablet Take 1 tablet by mouth daily. 05/27/14  Yes Historical Provider, MD  calcipotriene-betamethasone (TACLONEX) external suspension TACLONEX, 0.005-0.064% (External Suspension) -  Historical Medication  (0.005-0.064 %) Active Comments: Dr. Aubery Lapping   Yes Historical Provider, MD  Cetirizine HCl (ZYRTEC ALLERGY) 10 MG CAPS Take by mouth.   Yes Historical Provider, MD  fluticasone (FLONASE) 50 MCG/ACT nasal spray Place 2 sprays into the nose daily. 11/18/13  Yes Historical Provider, MD  folic acid (FOLVITE) 1 MG tablet Take 1 tablet by mouth daily. 08/27/14  Yes Historical Provider, MD  hydrochlorothiazide (MICROZIDE) 12.5 MG capsule 1 CAPSULE, ORAL, DAILY 03/10/15  Yes Glean Hess, MD  methotrexate (RHEUMATREX) 2.5 MG tablet Take 4 tablets by mouth once a week. 10/20/14  Yes Historical Provider, MD  metoprolol tartrate (LOPRESSOR) 25 MG tablet Take 0.5 tablets by mouth daily. 04/24/14  Yes Historical Provider, MD    No Known Allergies  Past Surgical History  Procedure Laterality Date  . Cholecystectomy    . Partial hysterectomy    . Breast lumpectomy Left 2008    Social History  Substance Use Topics  . Smoking status: Never Smoker   . Smokeless tobacco: None  . Alcohol Use: No     Medication list has been reviewed and updated.   Physical Exam  Constitutional: She is oriented to person, place, and time. She appears well-developed.  Neck: Normal range of motion. Neck supple.  Cardiovascular: Normal rate, regular rhythm and normal heart sounds.   Pulmonary/Chest: Effort normal and breath sounds normal. No respiratory distress.  Abdominal: Soft. She exhibits no mass. There is no tenderness. There is no guarding.  Neurological: She is alert and oriented to person, place, and time.  Psychiatric: She has a normal mood and affect.  Nursing note and vitals reviewed.   BP 136/68 mmHg  Pulse 88  Ht 5\' 2"  (1.575 m)  Wt 132 lb 11.2 oz (60.192 kg)  BMI 24.26 kg/m2  Assessment and Plan: 1. Dysuria Continue 80 ounces of fluids per day - POC urinalysis w microscopic (non auto) - ciprofloxacin (CIPRO) 250 MG tablet; Take 1 tablet (250 mg total) by mouth 2  (two) times daily.  Dispense: 14 tablet; Refill: 0   Halina Maidens, MD [facility]  03/23/2015

## 2015-03-23 NOTE — Patient Instructions (Signed)

## 2015-04-20 ENCOUNTER — Other Ambulatory Visit: Payer: Self-pay

## 2015-04-20 MED ORDER — FLUTICASONE PROPIONATE 50 MCG/ACT NA SUSP: 2.0000 | Freq: Every day | NASAL | Status: DC

## 2015-04-20 NOTE — Telephone Encounter (Signed)
Received fax from pharmacy.

## 2015-04-22 ENCOUNTER — Other Ambulatory Visit: Payer: Self-pay

## 2015-04-22 MED ORDER — METOPROLOL TARTRATE 25 MG PO TABS: 12.5000 mg | ORAL_TABLET | Freq: Every day | ORAL | Status: DC

## 2015-04-22 MED ORDER — ATORVASTATIN CALCIUM 10 MG PO TABS: 10.0000 mg | ORAL_TABLET | Freq: Every day | ORAL | Status: DC

## 2015-04-22 NOTE — Telephone Encounter (Signed)
Received fax requesting medications.

## 2015-05-12 ENCOUNTER — Other Ambulatory Visit: Payer: Self-pay

## 2015-05-12 MED ORDER — HYDROCHLOROTHIAZIDE 12.5 MG PO CAPS: ORAL_CAPSULE | ORAL | Status: DC

## 2015-05-12 MED ORDER — ATORVASTATIN CALCIUM 10 MG PO TABS: 10.0000 mg | ORAL_TABLET | Freq: Every day | ORAL | Status: DC

## 2015-05-12 NOTE — Telephone Encounter (Signed)
Patient called in requesting medications.

## 2015-08-20 ENCOUNTER — Other Ambulatory Visit: Payer: Self-pay | Admitting: Specialist

## 2015-08-20 DIAGNOSIS — R0602 Shortness of breath: Secondary | ICD-10-CM

## 2015-08-20 DIAGNOSIS — R9389 Abnormal findings on diagnostic imaging of other specified body structures: Secondary | ICD-10-CM

## 2015-08-28 ENCOUNTER — Ambulatory Visit
Admission: RE | Admit: 2015-08-28 | Discharge: 2015-08-28 | Disposition: A | Discharge status: Home / Self Care | Payer: Medicare Other | Source: Ambulatory Visit | Attending: Specialist | Admitting: Specialist

## 2015-08-28 DIAGNOSIS — J984 Other disorders of lung: Secondary | ICD-10-CM | POA: Diagnosis not present

## 2015-08-28 DIAGNOSIS — R918 Other nonspecific abnormal finding of lung field: Secondary | ICD-10-CM | POA: Insufficient documentation

## 2015-08-28 DIAGNOSIS — I2510 Atherosclerotic heart disease of native coronary artery without angina pectoris: Secondary | ICD-10-CM | POA: Insufficient documentation

## 2015-08-28 DIAGNOSIS — R0602 Shortness of breath: Secondary | ICD-10-CM | POA: Insufficient documentation

## 2015-08-28 DIAGNOSIS — I700 Atherosclerosis of aorta: Secondary | ICD-10-CM | POA: Diagnosis not present

## 2015-08-28 DIAGNOSIS — R938 Abnormal findings on diagnostic imaging of other specified body structures: Secondary | ICD-10-CM | POA: Insufficient documentation

## 2015-08-28 DIAGNOSIS — R9389 Abnormal findings on diagnostic imaging of other specified body structures: Secondary | ICD-10-CM

## 2015-09-10 ENCOUNTER — Other Ambulatory Visit: Payer: Self-pay | Admitting: Specialist

## 2015-09-10 DIAGNOSIS — R0609 Other forms of dyspnea: Secondary | ICD-10-CM

## 2015-09-10 DIAGNOSIS — R9389 Abnormal findings on diagnostic imaging of other specified body structures: Secondary | ICD-10-CM

## 2015-09-10 DIAGNOSIS — R918 Other nonspecific abnormal finding of lung field: Secondary | ICD-10-CM

## 2015-09-16 ENCOUNTER — Telehealth: Payer: Self-pay

## 2015-09-16 ENCOUNTER — Other Ambulatory Visit: Payer: Self-pay

## 2015-09-16 DIAGNOSIS — Z1239 Encounter for other screening for malignant neoplasm of breast: Secondary | ICD-10-CM

## 2015-09-16 DIAGNOSIS — Z87898 Personal history of other specified conditions: Secondary | ICD-10-CM

## 2015-09-16 NOTE — Telephone Encounter (Signed)
Patient gets her Mammograms in [place] but needs order. Scheduling Medicare Wellness after Sept.

## 2015-12-23 ENCOUNTER — Encounter: Payer: Self-pay | Admitting: Internal Medicine

## 2015-12-29 ENCOUNTER — Encounter: Payer: Self-pay | Admitting: Internal Medicine

## 2015-12-29 ENCOUNTER — Ambulatory Visit (INDEPENDENT_AMBULATORY_CARE_PROVIDER_SITE_OTHER): Payer: Medicare Other | Admitting: Internal Medicine

## 2015-12-29 VITALS — BP 122/80 | HR 66 | Resp 16 | Ht 62.0 in | Wt 130.0 lb

## 2015-12-29 DIAGNOSIS — J3081 Allergic rhinitis due to animal (cat) (dog) hair and dander: Secondary | ICD-10-CM

## 2015-12-29 DIAGNOSIS — C50919 Malignant neoplasm of unspecified site of unspecified female breast: Secondary | ICD-10-CM

## 2015-12-29 DIAGNOSIS — E782 Mixed hyperlipidemia: Secondary | ICD-10-CM

## 2015-12-29 DIAGNOSIS — Z0000 Encounter for general adult medical examination without abnormal findings: Secondary | ICD-10-CM | POA: Diagnosis not present

## 2015-12-29 DIAGNOSIS — L400 Psoriasis vulgaris: Secondary | ICD-10-CM

## 2015-12-29 DIAGNOSIS — I10 Essential (primary) hypertension: Secondary | ICD-10-CM

## 2015-12-29 LAB — POCT URINALYSIS DIPSTICK
Bilirubin, UA: NEGATIVE
Glucose, UA: NEGATIVE
KETONES UA: NEGATIVE
LEUKOCYTES UA: NEGATIVE
NITRITE UA: NEGATIVE
PH UA: 6
Protein, UA: NEGATIVE
RBC UA: NEGATIVE
Spec Grav, UA: 1.01
Urobilinogen, UA: 0.2

## 2015-12-29 MED ORDER — FLUTICASONE PROPIONATE 50 MCG/ACT NA SUSP: 2.0000 | Freq: Every day | NASAL | 5 refills | Status: DC

## 2015-12-29 NOTE — Progress Notes (Signed)
Patient: [name], Female    DOB: [birth year], 82 y.o.   MRN: [id number] Visit Date: 12/29/2015  Today's Provider: Halina Maidens, MD   Chief Complaint  Patient presents with  . Medicare Wellness   Subjective:    Annual wellness visit [name] is a 82 y.o. female who presents today for her Subsequent Annual Wellness Visit. She feels fairly well. She reports exercising walking. She reports she is sleeping fairly well.   ----------------------------------------------------------- Hypertension  This is a chronic problem. The current episode started more than 1 year ago. The problem has been gradually worsening since onset. Associated symptoms include shortness of breath. Pertinent negatives include no chest pain, headaches or palpitations. Past treatments include diuretics and beta blockers.  Hyperlipidemia  This is a chronic problem. The problem is controlled. Recent lipid tests were reviewed and are normal. Associated symptoms include shortness of breath. Pertinent negatives include no chest pain. Current antihyperlipidemic treatment includes statins.  Asthma  She complains of shortness of breath. There is no cough or wheezing. This is a chronic problem. The current episode started more than 1 year ago. The problem has been gradually improving. Pertinent negatives include no chest pain, fever, headaches or trouble swallowing. Her symptoms are alleviated by beta-agonist. Her past medical history is significant for asthma.    Review of Systems  Constitutional: Negative for chills, fatigue and fever.  HENT: Negative for congestion, hearing loss, tinnitus, trouble swallowing and voice change.   Eyes: Negative for visual disturbance.  Respiratory: Positive for shortness of breath. Negative for cough, chest tightness and wheezing.   Cardiovascular: Negative for chest pain, palpitations and leg swelling.  Gastrointestinal: Negative for abdominal pain, constipation, diarrhea and  vomiting.  Endocrine: Negative for polydipsia and polyuria.  Genitourinary: Negative for dysuria, frequency, genital sores, vaginal bleeding and vaginal discharge.  Musculoskeletal: Positive for arthralgias (right hip bursitis). Negative for gait problem and joint swelling.  Skin: Negative for color change and rash.  Neurological: Negative for dizziness, tremors, light-headedness and headaches.  Hematological: Negative for adenopathy. Does not bruise/bleed easily.  Psychiatric/Behavioral: Negative for dysphoric mood and sleep disturbance. The patient is not nervous/anxious.     Social History   Social History  . Marital status: Married    Spouse name: N/A  . Number of children: N/A  . Years of education: N/A   Occupational History  . Not on file.   Social History Main Topics  . Smoking status: Never Smoker  . Smokeless tobacco: Never Used  . Alcohol use No  . Drug use: No  . Sexual activity: Not on file   Other Topics Concern  . Not on file   Social History Narrative  . No narrative on file    Patient Active Problem List   Diagnosis Date Noted  . Laboratory animal allergy 10/30/2014  . Psoriasis vulgaris 10/30/2014  . Essential (primary) hypertension 10/30/2014  . Gastro-esophageal reflux disease without esophagitis 10/30/2014  . Hot flash, menopausal 10/30/2014  . Combined fat and carbohydrate induced hyperlipemia 10/30/2014  . Primary cancer of female breast ([place]) 10/30/2014  . Abnormal chest x-ray 09/24/2013    Past Surgical History:  Procedure Laterality Date  . BREAST LUMPECTOMY Left 2008  . CHOLECYSTECTOMY    . PARTIAL HYSTERECTOMY      Her family history includes Breast cancer in her sister.    Previous Medications   ALBUTEROL (PROVENTIL HFA;VENTOLIN HFA) 108 (90 BASE) MCG/ACT INHALER    Inhale into the lungs.   ASPIRIN  81 MG CHEWABLE TABLET    Chew 1 tablet by mouth daily. Taking 1/4 tab   ATORVASTATIN (LIPITOR) 10 MG TABLET    Take by mouth.    CALCIUM CARB-CHOLECALCIFEROL (CALCIUM 600+D3 PO)    Take by mouth.   CETIRIZINE HCL 10 MG CAPS    Take by mouth.   CYANOCOBALAMIN (VITAMIN B 12 PO)    Take 1,000 mcg by mouth.   FLUTICASONE (FLONASE) 50 MCG/ACT NASAL SPRAY    Place 2 sprays into both nostrils daily.   FOLIC ACID (FOLVITE) 1 MG TABLET    Take 1 tablet by mouth daily.   HYDROCHLOROTHIAZIDE (MICROZIDE) 12.5 MG CAPSULE    1 CAPSULE, ORAL, DAILY   METHOTREXATE (RHEUMATREX) 2.5 MG TABLET    Take 4 tablets by mouth once a week.   METOPROLOL TARTRATE (LOPRESSOR) 25 MG TABLET    Take 0.5 tablets (12.5 mg total) by mouth daily.   MULTIPLE VITAMINS-MINERALS (MULTIVITAMIN ADULT PO)    Take by mouth.   OMEGA-3 FATTY ACIDS (FP FISH OIL PO)    Take by mouth.   PROBIOTIC PRODUCT (PROBIOTIC ADVANCED PO)    Take by mouth.    Patient Care Team: Glean Hess, MD as PCP - General (Family Medicine)     Objective:   Vitals: BP 122/80   Pulse 66   Resp 16   Ht 5\' 2"  (1.575 m)   Wt 130 lb (59 kg)   SpO2 97%   BMI 23.78 kg/m   Physical Exam  Constitutional: She is oriented to person, place, and time. She appears well-developed and well-nourished. No distress.  HENT:  Head: Normocephalic and atraumatic.  Right Ear: Tympanic membrane and ear canal normal.  Left Ear: Tympanic membrane and ear canal normal.  Nose: Right sinus exhibits no maxillary sinus tenderness. Left sinus exhibits no maxillary sinus tenderness.  Mouth/Throat: Uvula is midline and oropharynx is clear and moist.  Eyes: Conjunctivae and EOM are normal. Right eye exhibits no discharge. Left eye exhibits no discharge. No scleral icterus.  Neck: Normal range of motion. Carotid bruit is not present. No erythema present. No thyromegaly present.  Cardiovascular: Normal rate, regular rhythm, normal heart sounds and normal pulses.   Pulmonary/Chest: Effort normal. No respiratory distress. She has no wheezes. Right breast exhibits no mass, no nipple discharge, no skin change  and no tenderness. Left breast exhibits skin change (from lumpectomy scar). Left breast exhibits no mass, no nipple discharge and no tenderness.    Abdominal: Soft. Bowel sounds are normal. There is no hepatosplenomegaly. There is no tenderness. There is no CVA tenderness.  Musculoskeletal: Normal range of motion.       Right hip: She exhibits tenderness. She exhibits normal range of motion and normal strength.  Lymphadenopathy:    She has no cervical adenopathy.    She has no axillary adenopathy.  Neurological: She is alert and oriented to person, place, and time. She has normal reflexes. No cranial nerve deficit or sensory deficit.  Skin: Skin is warm and dry. No rash noted.  Plantar wart on lateral left foot  Psychiatric: She has a normal mood and affect. Her speech is normal and behavior is normal. Thought content normal.  Nursing note and vitals reviewed.   Activities of Daily Living In your present state of health, do you have any difficulty performing the following activities: 12/29/2015  Hearing? N  Vision? N  Difficulty concentrating or making decisions? N  Walking or climbing stairs? N  Dressing or  bathing? N  Doing errands, shopping? N  Preparing Food and eating ? N  Using the Toilet? N  In the past six months, have you accidently leaked urine? Y  Do you have problems with loss of bowel control? N  Managing your Medications? N  Managing your Finances? N  Housekeeping or managing your Housekeeping? N  Some recent data might be hidden    Fall Risk Assessment Fall Risk  12/29/2015 11/25/2014  Falls in the past year? No No      Depression Screen PHQ 2/9 Scores 12/29/2015 11/25/2014  PHQ - 2 Score 0 0    Cognitive Testing - 6-CIT   Correct? Score   What year is it? yes 0 Yes = 0    No = 4  What month is it? yes 0 Yes = 0    No = 3  Remember:     [name], [place], [place]     What time is it? yes 0 Yes = 0    No = 3  Count backwards from 20 to 1 yes 0  Correct = 0    1 error = 2   More than 1 error = 4  Say the months of the year in reverse. yes 0 Correct = 0    1 error = 2   More than 1 error = 4  What address did I ask you to remember? yes 0 Correct = 0  1 error = 2    2 error = 4    3 error = 6    4 error = 8    All wrong = 10       TOTAL SCORE  0/28   Interpretation:  Normal  Normal (0-7) Abnormal (8-28)        Medicare Annual Wellness Visit Summary:  Reviewed patient's Family Medical History Reviewed and updated list of patient's medical providers Assessment of cognitive impairment was done Assessed patient's functional ability Established a written schedule for health screening [place] Completed and Reviewed  Exercise Activities and Dietary recommendations Goals    . Prevent Falls          Prevent Falls and remain active.        Immunization History  Administered Date(s) Administered  . Influenza,inj,Quad PF,36+ Mos 11/25/2014  . Influenza-Unspecified 12/06/2015  . Pneumococcal Conjugate-13 11/22/2013  . Pneumococcal Polysaccharide-23 03/08/2000  . Tdap 07/06/2007  . Zoster 03/08/2005    Health Maintenance  Topic Date Due  . DEXA SCAN  10/27/2000  . TETANUS/TDAP  07/05/2017  . INFLUENZA VACCINE  Completed  . ZOSTAVAX  Completed  . PNA vac Low Risk Adult  Excluded     Discussed health benefits of physical activity, and encouraged her to engage in regular exercise appropriate for her age and condition.    ------------------------------------------------------------------------------------------------------------   Assessment & Plan:   1. Medicare annual wellness visit, subsequent Measures satisfied  2. Essential (primary) hypertension controlled - CBC with Differential/Platelet - Comprehensive metabolic panel - TSH - POCT urinalysis dipstick  3. Combined fat and carbohydrate induced hyperlipemia On statin therapy - Lipid panel  4. Primary cancer of female breast  [facility]) Schedule annual screening mammograms  5. Psoriasis vulgaris Improved on methotrexate therapy    Halina Maidens, MD [facility]  12/29/2015

## 2015-12-29 NOTE — Patient Instructions (Signed)
Health Maintenance  Topic Date Due  . TETANUS/TDAP  07/05/2017  . INFLUENZA VACCINE  Completed  . DEXA SCAN  Addressed  . ZOSTAVAX  Completed  . PNA vac Low Risk Adult  Excluded   DASH Eating Plan DASH stands for "Dietary Approaches to Stop Hypertension." The DASH eating plan is a healthy eating plan that has been shown to reduce high blood pressure (hypertension). Additional health benefits may include reducing the risk of type 2 diabetes mellitus, heart disease, and stroke. The DASH eating plan may also help with weight loss. WHAT DO I NEED TO KNOW ABOUT THE DASH EATING PLAN? For the DASH eating plan, you will follow these general guidelines:  Choose foods with a percent daily value for sodium of less than 5% (as listed on the food label).  Use salt-free seasonings or herbs instead of table salt or sea salt.  Check with your health care provider or pharmacist before using salt substitutes.  Eat lower-sodium products, often labeled as "lower sodium" or "no salt added."  Eat fresh foods.  Eat more vegetables, fruits, and low-fat dairy products.  Choose whole grains. Look for the word "whole" as the first word in the ingredient list.  Choose fish and skinless chicken or [place] more often than red meat. Limit fish, poultry, and meat to 6 oz (170 g) each day.  Limit sweets, desserts, sugars, and sugary drinks.  Choose heart-healthy fats.  Limit cheese to 1 oz (28 g) per day.  Eat more home-cooked food and less restaurant, buffet, and fast food.  Limit fried foods.  Cook foods using methods other than frying.  Limit canned vegetables. If you do use them, rinse them well to decrease the sodium.  When eating at a restaurant, ask that your food be prepared with less salt, or no salt if possible. WHAT FOODS CAN I EAT? Seek help from a dietitian for individual calorie needs. Grains Whole grain or whole wheat bread. Brown rice. Whole grain or whole wheat pasta. Quinoa, bulgur,  and whole grain cereals. Low-sodium cereals. Corn or whole wheat flour tortillas. Whole grain cornbread. Whole grain crackers. Low-sodium crackers. Vegetables Fresh or frozen vegetables (raw, steamed, roasted, or grilled). Low-sodium or reduced-sodium tomato and vegetable juices. Low-sodium or reduced-sodium tomato sauce and paste. Low-sodium or reduced-sodium canned vegetables.  Fruits All fresh, canned (in natural juice), or frozen fruits. Meat and Other Protein Products Ground beef (85% or leaner), grass-fed beef, or beef trimmed of fat. Skinless chicken or [place]. Ground chicken or [place]. Pork trimmed of fat. All fish and seafood. Eggs. Dried beans, peas, or lentils. Unsalted nuts and seeds. Unsalted canned beans. Dairy Low-fat dairy products, such as skim or 1% milk, 2% or reduced-fat cheeses, low-fat ricotta or cottage cheese, or plain low-fat yogurt. Low-sodium or reduced-sodium cheeses. Fats and Oils Tub margarines without trans fats. Light or reduced-fat mayonnaise and salad dressings (reduced sodium). Avocado. Safflower, olive, or canola oils. Natural peanut or almond butter. Other Unsalted popcorn and pretzels. The items listed above may not be a complete list of recommended foods or beverages. Contact your dietitian for more options. WHAT FOODS ARE NOT RECOMMENDED? Grains White bread. White pasta. White rice. Refined cornbread. Bagels and croissants. Crackers that contain trans fat. Vegetables Creamed or fried vegetables. Vegetables in a cheese sauce. Regular canned vegetables. Regular canned tomato sauce and paste. Regular tomato and vegetable juices. Fruits Dried fruits. Canned fruit in light or heavy syrup. Fruit juice. Meat and Other Protein Products Fatty cuts of meat.  Ribs, chicken wings, bacon, sausage, bologna, salami, chitterlings, fatback, hot dogs, bratwurst, and packaged luncheon meats. Salted nuts and seeds. Canned beans with salt. Dairy Whole or 2% milk, cream,  half-and-half, and cream cheese. Whole-fat or sweetened yogurt. Full-fat cheeses or blue cheese. Nondairy creamers and whipped toppings. Processed cheese, cheese spreads, or cheese curds. Condiments Onion and garlic salt, seasoned salt, table salt, and sea salt. Canned and packaged gravies. Worcestershire sauce. Tartar sauce. Barbecue sauce. Teriyaki sauce. Soy sauce, including reduced sodium. Steak sauce. Fish sauce. Oyster sauce. Cocktail sauce. Horseradish. Ketchup and mustard. Meat flavorings and tenderizers. Bouillon cubes. Hot sauce. Tabasco sauce. Marinades. Taco seasonings. Relishes. Fats and Oils Butter, stick margarine, lard, shortening, ghee, and bacon fat. Coconut, palm kernel, or palm oils. Regular salad dressings. Other Pickles and olives. Salted popcorn and pretzels. The items listed above may not be a complete list of foods and beverages to avoid. Contact your dietitian for more information. WHERE CAN I FIND MORE INFORMATION? National Heart, Lung, and Blood Institute: travelstabloid.com   This information is not intended to replace advice given to you by your health care provider. Make sure you discuss any questions you have with your health care provider.   Document Released: 02/10/2011 Document Revised: 03/14/2014 Document Reviewed: 12/26/2012 Elsevier Interactive Patient Education [employer].

## 2015-12-30 LAB — LIPID PANEL
CHOL/HDL RATIO: 2 ratio (ref 0.0–4.4)
Cholesterol, Total: 198 mg/dL (ref 100–199)
HDL: 101 mg/dL (ref >39)
LDL CALC: 76 mg/dL (ref 0–99)
Triglycerides: 106 mg/dL (ref 0–149)
VLDL Cholesterol Cal: 21 mg/dL (ref 5–40)

## 2015-12-30 LAB — COMPREHENSIVE METABOLIC PANEL
ALBUMIN: 4.5 g/dL (ref 3.5–4.7)
ALT: 17 IU/L (ref 0–32)
AST: 23 IU/L (ref 0–40)
Albumin/Globulin Ratio: 1.5 (ref 1.2–2.2)
Alkaline Phosphatase: 81 IU/L (ref 39–117)
BUN / CREAT RATIO: 24 (ref 12–28)
BUN: 20 mg/dL (ref 8–27)
Bilirubin Total: 0.4 mg/dL (ref 0.0–1.2)
CALCIUM: 9.4 mg/dL (ref 8.7–10.3)
CO2: 22 mmol/L (ref 18–29)
CREATININE: 0.84 mg/dL (ref 0.57–1.00)
Chloride: 102 mmol/L (ref 96–106)
GFR, EST AFRICAN AMERICAN: 76 mL/min/{1.73_m2} (ref >59)
GFR, EST NON AFRICAN AMERICAN: 66 mL/min/{1.73_m2} (ref >59)
GLOBULIN, TOTAL: 3.1 g/dL (ref 1.5–4.5)
Glucose: 79 mg/dL (ref 65–99)
Potassium: 4.2 mmol/L (ref 3.5–5.2)
SODIUM: 142 mmol/L (ref 134–144)
Total Protein: 7.6 g/dL (ref 6.0–8.5)

## 2015-12-30 LAB — CBC WITH DIFFERENTIAL/PLATELET
BASOS: 0 %
Basophils Absolute: 0 10*3/uL (ref 0.0–0.2)
EOS (ABSOLUTE): 0.3 10*3/uL (ref 0.0–0.4)
EOS: 3 %
HEMATOCRIT: 41.9 % (ref 34.0–46.6)
HEMOGLOBIN: 13.8 g/dL (ref 11.1–15.9)
IMMATURE GRANULOCYTES: 0 %
Immature Grans (Abs): 0 10*3/uL (ref 0.0–0.1)
LYMPHS ABS: 2 10*3/uL (ref 0.7–3.1)
Lymphs: 21 %
MCH: 29.9 pg (ref 26.6–33.0)
MCHC: 32.9 g/dL (ref 31.5–35.7)
MCV: 91 fL (ref 79–97)
MONOCYTES: 8 %
Monocytes Absolute: 0.7 10*3/uL (ref 0.1–0.9)
NEUTROS PCT: 68 %
Neutrophils Absolute: 6.4 10*3/uL (ref 1.4–7.0)
Platelets: 237 10*3/uL (ref 150–379)
RBC: 4.62 x10E6/uL (ref 3.77–5.28)
RDW: 14.3 % (ref 12.3–15.4)
WBC: 9.5 10*3/uL (ref 3.4–10.8)

## 2015-12-30 LAB — TSH: TSH: 2.45 u[IU]/mL (ref 0.450–4.500)

## 2016-02-04 ENCOUNTER — Encounter: Payer: Self-pay | Admitting: Internal Medicine

## 2016-02-22 ENCOUNTER — Ambulatory Visit
Admission: RE | Admit: 2016-02-22 | Discharge: 2016-02-22 | Disposition: A | Discharge status: Home / Self Care | Payer: Medicare Other | Source: Ambulatory Visit | Attending: Specialist | Admitting: Specialist

## 2016-02-22 DIAGNOSIS — R918 Other nonspecific abnormal finding of lung field: Secondary | ICD-10-CM | POA: Insufficient documentation

## 2016-02-22 DIAGNOSIS — I700 Atherosclerosis of aorta: Secondary | ICD-10-CM | POA: Diagnosis not present

## 2016-02-22 DIAGNOSIS — R0609 Other forms of dyspnea: Secondary | ICD-10-CM | POA: Diagnosis not present

## 2016-02-22 DIAGNOSIS — J479 Bronchiectasis, uncomplicated: Secondary | ICD-10-CM | POA: Diagnosis not present

## 2016-02-22 DIAGNOSIS — R938 Abnormal findings on diagnostic imaging of other specified body structures: Secondary | ICD-10-CM | POA: Insufficient documentation

## 2016-02-22 DIAGNOSIS — R9389 Abnormal findings on diagnostic imaging of other specified body structures: Secondary | ICD-10-CM

## 2016-04-12 ENCOUNTER — Encounter: Payer: Self-pay | Admitting: Internal Medicine

## 2016-05-10 ENCOUNTER — Other Ambulatory Visit: Payer: Self-pay | Admitting: Internal Medicine

## 2016-05-24 ENCOUNTER — Other Ambulatory Visit: Payer: Self-pay | Admitting: Internal Medicine

## 2016-06-13 ENCOUNTER — Encounter: Payer: Self-pay | Admitting: Internal Medicine

## 2016-06-13 ENCOUNTER — Ambulatory Visit (INDEPENDENT_AMBULATORY_CARE_PROVIDER_SITE_OTHER): Payer: Medicare Other | Admitting: Internal Medicine

## 2016-06-13 VITALS — BP 124/80 | HR 76 | Temp 97.8°F | Ht 62.0 in | Wt 128.0 lb

## 2016-06-13 DIAGNOSIS — N3000 Acute cystitis without hematuria: Secondary | ICD-10-CM

## 2016-06-13 LAB — POCT URINALYSIS DIPSTICK
Bilirubin, UA: NEGATIVE
Glucose, UA: NEGATIVE
Ketones, UA: NEGATIVE
Nitrite, UA: NEGATIVE
PH UA: 5 (ref 5.0–8.0)
PROTEIN UA: NEGATIVE
RBC UA: NEGATIVE
SPEC GRAV UA: 1.015 (ref 1.030–1.035)
Urobilinogen, UA: 0.2 (ref ?–2.0)

## 2016-06-13 MED ORDER — NITROFURANTOIN MONOHYD MACRO 100 MG PO CAPS: 100.0000 mg | ORAL_CAPSULE | Freq: Two times a day (BID) | ORAL | 0 refills | Status: DC

## 2016-06-13 NOTE — Progress Notes (Signed)
Date:  06/13/2016   Name:  [name]   DOB:  1933-08-22   MRN:  [id number]   Chief Complaint: Urinary Tract Infection (Went to urgent care in , [place] and was seen for UTI. 05/09/2015- was started on Cipro but then called and told to start different antibiotic called macrobid. Helped for a little bit, but now urgency to go without being able to have much relief is back. ) Urinary Tract Infection   This is a recurrent problem. The current episode started more than 1 month ago. The problem has been gradually worsening. The patient is experiencing no pain. There has been no fever. Associated symptoms include frequency, hesitancy and urgency. Pertinent negatives include no chills, hematuria, nausea or vomiting.  On questioning, she never felt that her sx completely resolved after finishing macrobid.  Her sx are not worsening but persistent.  She went to [facility] so I do not have the culture report.    Review of Systems  Constitutional: Negative for chills, fatigue and fever.  Respiratory: Negative for chest tightness and shortness of breath.   Cardiovascular: Negative for chest pain, palpitations and leg swelling.  Gastrointestinal: Negative for diarrhea, nausea and vomiting.  Genitourinary: Positive for frequency, hesitancy and urgency. Negative for difficulty urinating, dysuria and hematuria.    Patient Active Problem List   Diagnosis Date Noted  . Laboratory animal allergy 10/30/2014  . Psoriasis vulgaris 10/30/2014  . Essential (primary) hypertension 10/30/2014  . Gastro-esophageal reflux disease without esophagitis 10/30/2014  . Hot flash, menopausal 10/30/2014  . Combined fat and carbohydrate induced hyperlipemia 10/30/2014  . Primary cancer of female breast ([place]) 10/30/2014  . Abnormal chest x-ray 09/24/2013    Prior to Admission medications   Medication Sig Start Date End Date Taking? Authorizing Provider  albuterol (PROVENTIL HFA;VENTOLIN HFA) 108 (90 Base) MCG/ACT  inhaler Inhale into the lungs. 08/20/15 08/19/16 Yes Historical Provider, MD  aspirin 81 MG chewable tablet Chew 1 tablet by mouth daily. Taking 1/4 tab   Yes Historical Provider, MD  atorvastatin (LIPITOR) 10 MG tablet take 1 tablet by mouth once daily 05/24/16  Yes Glean Hess, MD  Calcium Carb-Cholecalciferol (CALCIUM 600+D3 PO) Take by mouth.   Yes Historical Provider, MD  Cetirizine HCl 10 MG CAPS Take by mouth.   Yes Historical Provider, MD  Cyanocobalamin (VITAMIN B 12 PO) Take 1,000 mcg by mouth.   Yes Historical Provider, MD  fluticasone (FLONASE) 50 MCG/ACT nasal spray Place 2 sprays into both nostrils daily. 12/29/15  Yes Glean Hess, MD  folic acid (FOLVITE) 1 MG tablet Take 1 tablet by mouth daily. 08/27/14  Yes Historical Provider, MD  hydrochlorothiazide (MICROZIDE) 12.5 MG capsule take 1 capsule by mouth once daily 05/10/16  Yes Glean Hess, MD  methotrexate (RHEUMATREX) 2.5 MG tablet Take 4 tablets by mouth once a week. 10/20/14  Yes Historical Provider, MD  metoprolol tartrate (LOPRESSOR) 25 MG tablet Take 0.5 tablets (12.5 mg total) by mouth daily. 04/22/15  Yes Glean Hess, MD  Multiple Vitamins-Minerals (MULTIVITAMIN ADULT PO) Take by mouth.   Yes Historical Provider, MD  Omega-3 Fatty Acids (FP FISH OIL PO) Take by mouth.   Yes Historical Provider, MD  Probiotic Product (PROBIOTIC ADVANCED PO) Take by mouth.   Yes Historical Provider, MD    No Known Allergies  Past Surgical History:  Procedure Laterality Date  . BREAST LUMPECTOMY Left 2008  . CHOLECYSTECTOMY    . PARTIAL HYSTERECTOMY  Social History  Substance Use Topics  . Smoking status: Never Smoker  . Smokeless tobacco: Never Used  . Alcohol use No     Medication list has been reviewed and updated.   Physical Exam  Constitutional: She is oriented to person, place, and time. She appears well-developed. No distress.  HENT:  Head: Normocephalic and atraumatic.  Cardiovascular: Normal  rate, regular rhythm and normal heart sounds.   Pulmonary/Chest: Effort normal. No respiratory distress. She has no wheezes.  Abdominal: Soft. Normal appearance and bowel sounds are normal. There is no tenderness. There is no guarding and no CVA tenderness.  Musculoskeletal: Normal range of motion.  Neurological: She is alert and oriented to person, place, and time.  Skin: Skin is warm and dry. No rash noted.  Psychiatric: She has a normal mood and affect. Her behavior is normal. Thought content normal.  Nursing note and vitals reviewed.   BP 124/80 (BP Location: Right Arm, Patient Position: Sitting, Cuff Size: Normal)   Pulse 76   Temp 97.8 F (36.6 C)   Ht 5\' 2"  (1.575 m)   Wt 128 lb (58.1 kg)   SpO2 96%   BMI 23.41 kg/m   Assessment and Plan: 1. Acute cystitis without hematuria QNS for micro or culture Will treat with another course of macrobid Patient will return in one week if sx persistent - POCT urinalysis dipstick - nitrofurantoin, macrocrystal-monohydrate, (MACROBID) 100 MG capsule; Take 1 capsule (100 mg total) by mouth 2 (two) times daily.  Dispense: 14 capsule; Refill: 0   Meds ordered this encounter  Medications  . nitrofurantoin, macrocrystal-monohydrate, (MACROBID) 100 MG capsule    Sig: Take 1 capsule (100 mg total) by mouth 2 (two) times daily.    Dispense:  14 capsule    Refill:  0    Halina Maidens, MD [facility]  06/13/2016

## 2016-06-20 ENCOUNTER — Telehealth: Payer: Self-pay

## 2016-06-20 ENCOUNTER — Other Ambulatory Visit: Payer: Self-pay | Admitting: Internal Medicine

## 2016-06-20 DIAGNOSIS — R300 Dysuria: Secondary | ICD-10-CM

## 2016-06-20 NOTE — Telephone Encounter (Signed)
Informed pt to come in to give UA- pt stated she will come this afternoon for this.

## 2016-06-20 NOTE — Telephone Encounter (Signed)
Pt called stating she finished antibiotics for UTI and still does not feel better. Was told to call back if not better. Advice please?

## 2016-06-20 NOTE — Telephone Encounter (Signed)
She needs to come here with a full bladder to get UA and urine culture.

## 2016-06-21 ENCOUNTER — Other Ambulatory Visit: Payer: Self-pay | Admitting: Internal Medicine

## 2016-06-22 LAB — URINE CULTURE: Organism ID, Bacteria: NO GROWTH

## 2016-06-27 ENCOUNTER — Encounter: Payer: Self-pay | Admitting: Internal Medicine

## 2016-06-27 ENCOUNTER — Ambulatory Visit (INDEPENDENT_AMBULATORY_CARE_PROVIDER_SITE_OTHER): Payer: Medicare Other | Admitting: Internal Medicine

## 2016-06-27 VITALS — BP 124/60 | HR 72 | Ht 62.0 in | Wt 135.2 lb

## 2016-06-27 DIAGNOSIS — C50919 Malignant neoplasm of unspecified site of unspecified female breast: Secondary | ICD-10-CM | POA: Diagnosis not present

## 2016-06-27 DIAGNOSIS — N952 Postmenopausal atrophic vaginitis: Secondary | ICD-10-CM | POA: Diagnosis not present

## 2016-06-27 LAB — POCT WET PREP WITH KOH
CLUE CELLS WET PREP PER HPF POC: NEGATIVE
KOH Prep POC: NEGATIVE
RBC WET PREP PER HPF POC: 0
Trichomonas, UA: NEGATIVE
WBC WET PREP PER HPF POC: 2
Yeast Wet Prep HPF POC: NEGATIVE

## 2016-06-27 MED ORDER — ESTROGENS, CONJUGATED 0.625 MG/GM VA CREA: TOPICAL_CREAM | VAGINAL | 12 refills | Status: DC

## 2016-06-27 NOTE — Progress Notes (Signed)
Date:  06/27/2016   Name:  [name]   DOB:  Apr 02, 1933   MRN:  [id number]   Chief Complaint: Urinary Frequency (Have to go often- but nothing comes out when have this feeling. Has some bladder pressure. Also, feels like finished urinating and at time urinates on self. No burning or itching - just some blisters pop up on vagina area when taking antibiotics. ) She feels like she emptys well but then has the urge to go again.  She also has discomfort externally - worse with urination.  No fever, nausea, chills.  Recent Urine culture was negative. She has had three courses of antibiotics in the last 6 weeks with no benefit.   Review of Systems  Constitutional: Negative for chills, fatigue and fever.  Respiratory: Negative for chest tightness and shortness of breath.   Cardiovascular: Negative for chest pain.  Gastrointestinal: Negative for abdominal pain, nausea and vomiting.  Genitourinary: Positive for dysuria, frequency, genital sores and vaginal pain. Negative for hematuria, vaginal bleeding and vaginal discharge.  Musculoskeletal: Negative for arthralgias.  Psychiatric/Behavioral: Negative for sleep disturbance.    Patient Active Problem List   Diagnosis Date Noted  . Laboratory animal allergy 10/30/2014  . Psoriasis vulgaris 10/30/2014  . Essential (primary) hypertension 10/30/2014  . Gastro-esophageal reflux disease without esophagitis 10/30/2014  . Hot flash, menopausal 10/30/2014  . Combined fat and carbohydrate induced hyperlipemia 10/30/2014  . Primary cancer of female breast ([place]) 10/30/2014  . Abnormal chest x-ray 09/24/2013    Prior to Admission medications   Medication Sig Start Date End Date Taking? Authorizing Provider  aspirin 81 MG chewable tablet Chew 1 tablet by mouth daily. Taking 1/4 tab   Yes Historical Provider, MD  atorvastatin (LIPITOR) 10 MG tablet take 1 tablet by mouth once daily 05/24/16  Yes Glean Hess, MD  Calcium Carb-Cholecalciferol  (CALCIUM 600+D3 PO) Take by mouth.   Yes Historical Provider, MD  Cetirizine HCl 10 MG CAPS Take by mouth.   Yes Historical Provider, MD  Cyanocobalamin (VITAMIN B 12 PO) Take 1,000 mcg by mouth.   Yes Historical Provider, MD  fluticasone (FLONASE) 50 MCG/ACT nasal spray Place 2 sprays into both nostrils daily. 12/29/15  Yes Glean Hess, MD  folic acid (FOLVITE) 1 MG tablet Take 1 tablet by mouth daily. 08/27/14  Yes Historical Provider, MD  hydrochlorothiazide (MICROZIDE) 12.5 MG capsule take 1 capsule by mouth once daily 05/10/16  Yes Glean Hess, MD  methotrexate (RHEUMATREX) 2.5 MG tablet Take 4 tablets by mouth once a week. 10/20/14  Yes Historical Provider, MD  metoprolol tartrate (LOPRESSOR) 25 MG tablet take 1/2 tablet by mouth once daily 06/21/16  Yes Glean Hess, MD  Multiple Vitamins-Minerals (MULTIVITAMIN ADULT PO) Take by mouth.   Yes Historical Provider, MD  Omega-3 Fatty Acids (FP FISH OIL PO) Take by mouth.   Yes Historical Provider, MD  Probiotic Product (PROBIOTIC ADVANCED PO) Take by mouth.   Yes Historical Provider, MD    No Known Allergies  Past Surgical History:  Procedure Laterality Date  . BREAST LUMPECTOMY Left 2008  . CHOLECYSTECTOMY    . PARTIAL HYSTERECTOMY      Social History  Substance Use Topics  . Smoking status: Never Smoker  . Smokeless tobacco: Never Used  . Alcohol use No     Medication list has been reviewed and updated.   Physical Exam  Constitutional: She is oriented to person, place, and time. She appears well-developed and well-nourished.  No distress.  HENT:  Head: Normocephalic and atraumatic.  Cardiovascular: Normal rate, regular rhythm and normal heart sounds.   Pulmonary/Chest: Effort normal. No respiratory distress.  Abdominal: There is no tenderness.  Genitourinary: There is tenderness on the right labia. There is no lesion or injury on the right labia. There is tenderness on the left labia. There is no lesion or injury  on the left labia. Right adnexum displays no mass, no tenderness and no fullness. Left adnexum displays no mass, no tenderness and no fullness. There is tenderness in the vagina. No vaginal discharge found.  Musculoskeletal: Normal range of motion.  Neurological: She is alert and oriented to person, place, and time.  Skin: Skin is warm and dry. No rash noted.  Psychiatric: She has a normal mood and affect. Her behavior is normal. Thought content normal.  Nursing note and vitals reviewed.   BP 124/60 (BP Location: Right Arm, Patient Position: Sitting, Cuff Size: Normal)   Pulse 72   Ht 5\' 2"  (1.575 m)   Wt 135 lb 3.2 oz (61.3 kg)   SpO2 94%   BMI 24.73 kg/m   Assessment and Plan: 1. Atrophic vaginitis Apply small amount externally nightly for 2 weeks - if beneficial, taper to twice per week - POCT Wet Prep with KOH  2. Primary cancer of female breast [facility]) Now 10 years out - should be little risk of estrogen topically   Meds ordered this encounter  Medications  . conjugated estrogens (PREMARIN) vaginal cream    Sig: Place fingertip amount around vaginal and urethra nightly    Dispense:  42.5 g    Refill:  Cass, MD [facility]  06/27/2016

## 2016-07-19 ENCOUNTER — Other Ambulatory Visit: Payer: Self-pay | Admitting: Specialist

## 2016-07-19 DIAGNOSIS — R918 Other nonspecific abnormal finding of lung field: Secondary | ICD-10-CM

## 2016-09-16 ENCOUNTER — Telehealth: Payer: Self-pay

## 2016-09-16 NOTE — Telephone Encounter (Signed)
It looks like she goes to [place] don't have rights to order Mammograms at [facility] through the computer.  She should be able to call them herself and schedule since she has been there before.

## 2016-09-16 NOTE — Telephone Encounter (Signed)
Pt informed to call [facility] to schedule mammo- she stated she will and understood.

## 2016-09-16 NOTE — Telephone Encounter (Signed)
Pt called requesting to get mammo referral sent in- Please Advise.

## 2016-09-19 ENCOUNTER — Other Ambulatory Visit: Payer: Self-pay

## 2016-09-19 ENCOUNTER — Telehealth: Payer: Self-pay

## 2016-09-19 NOTE — Telephone Encounter (Signed)
Pt called requesting referral to [facility] in [place] over [place] order form. Told her we do not have any other order form so if this does not work then let them know we need them to fax [place] a order form to send back to [place]. She verbalized understanding.

## 2016-10-18 ENCOUNTER — Telehealth: Payer: Self-pay | Admitting: Internal Medicine

## 2016-10-18 NOTE — Telephone Encounter (Signed)
Called pt to schedule for Annual Wellness Visit with Nurse Health Advisor, Tiffany Hill, my c/b # is [phone]  [name] ° °

## 2016-12-29 ENCOUNTER — Ambulatory Visit (INDEPENDENT_AMBULATORY_CARE_PROVIDER_SITE_OTHER): Payer: Medicare Other | Admitting: Internal Medicine

## 2016-12-29 ENCOUNTER — Encounter: Payer: Self-pay | Admitting: Internal Medicine

## 2016-12-29 VITALS — BP 136/60 | HR 58 | Ht 62.0 in | Wt 132.0 lb

## 2016-12-29 DIAGNOSIS — J3089 Other allergic rhinitis: Secondary | ICD-10-CM

## 2016-12-29 DIAGNOSIS — I10 Essential (primary) hypertension: Secondary | ICD-10-CM

## 2016-12-29 DIAGNOSIS — E782 Mixed hyperlipidemia: Secondary | ICD-10-CM | POA: Diagnosis not present

## 2016-12-29 DIAGNOSIS — K219 Gastro-esophageal reflux disease without esophagitis: Secondary | ICD-10-CM | POA: Diagnosis not present

## 2016-12-29 DIAGNOSIS — Z23 Encounter for immunization: Secondary | ICD-10-CM

## 2016-12-29 DIAGNOSIS — Z0000 Encounter for general adult medical examination without abnormal findings: Secondary | ICD-10-CM

## 2016-12-29 DIAGNOSIS — C50919 Malignant neoplasm of unspecified site of unspecified female breast: Secondary | ICD-10-CM

## 2016-12-29 LAB — POCT URINALYSIS DIPSTICK
Bilirubin, UA: NEGATIVE
Glucose, UA: NEGATIVE
KETONES UA: NEGATIVE
Nitrite, UA: NEGATIVE
PROTEIN UA: NEGATIVE
RBC UA: NEGATIVE
UROBILINOGEN UA: 0.2 U/dL
pH, UA: 7 (ref 5.0–8.0)

## 2016-12-29 MED ORDER — ZOSTER VAC RECOMB ADJUVANTED 50 MCG/0.5ML IM SUSR: 0.5000 mL | Freq: Once | INTRAMUSCULAR | 1 refills | Status: AC

## 2016-12-29 MED ORDER — HYDROCHLOROTHIAZIDE 12.5 MG PO CAPS: 12.5000 mg | ORAL_CAPSULE | Freq: Every day | ORAL | 3 refills | Status: DC

## 2016-12-29 MED ORDER — FLUTICASONE PROPIONATE 50 MCG/ACT NA SUSP: 2.0000 | Freq: Every day | NASAL | 3 refills | Status: DC

## 2016-12-29 MED ORDER — ATORVASTATIN CALCIUM 10 MG PO TABS: 10.0000 mg | ORAL_TABLET | Freq: Every day | ORAL | 3 refills | Status: DC

## 2016-12-29 NOTE — Patient Instructions (Signed)
Health Maintenance  Topic Date Due  . TETANUS/TDAP  07/05/2017  . INFLUENZA VACCINE  Completed  . DEXA SCAN  Addressed  . PNA vac Low Risk Adult  Excluded

## 2016-12-29 NOTE — Progress Notes (Signed)
Patient: [name], Female    DOB: [birth year], 83 y.o.   MRN: [id number] Visit Date: 12/29/2016  Today's Provider: Halina Maidens, MD   Chief Complaint  Patient presents with  . Medicare Wellness    Needs refills on atorvastatin, HCTZ, and Flonase.    Subjective:    Annual wellness visit [name] is a 83 y.o. female who presents today for her Subsequent Annual Wellness Visit. She feels well. She reports exercising walking and playing golf regularly. She reports she is sleeping fairly well. She had a DEXA in 2011 which was normal - this was around the same time that she finished 5 years of Fosamax.  ----------------------------------------------------------- Hypertension  This is a chronic problem. The problem is controlled. Associated symptoms include shortness of breath. Pertinent negatives include no chest pain, headaches or palpitations. Past treatments include diuretics.  Hyperlipidemia  This is a chronic problem. The problem is controlled. Associated symptoms include shortness of breath. Pertinent negatives include no chest pain. Current antihyperlipidemic treatment includes statins and fibric acid derivatives.  Psoriasis - Dr. Evorn Gong in [place].  Doing well on MTX and Folate.  She would like her labs from today sent to him. Bronchiectasis - followed by Dr. Raul Del.  On no regular medication or inhalers.  She does have a CT chest scheduled.  Review of Systems  Constitutional: Negative for chills, fatigue and fever.  HENT: Negative for congestion, hearing loss, tinnitus, trouble swallowing and voice change.   Eyes: Negative for visual disturbance.  Respiratory: Positive for shortness of breath. Negative for cough, chest tightness and wheezing.   Cardiovascular: Negative for chest pain, palpitations and leg swelling.  Gastrointestinal: Negative for abdominal pain, constipation, diarrhea and vomiting.  Endocrine: Negative for polydipsia and polyuria.  Genitourinary:  Negative for dysuria, frequency, genital sores, vaginal bleeding and vaginal discharge.  Musculoskeletal: Negative for arthralgias, gait problem and joint swelling.  Skin: Negative for color change and rash.  Neurological: Negative for dizziness, tremors, light-headedness and headaches.  Hematological: Negative for adenopathy. Does not bruise/bleed easily.  Psychiatric/Behavioral: Negative for dysphoric mood and sleep disturbance. The patient is not nervous/anxious.     Social History   Social History  . Marital status: Married    Spouse name: N/A  . Number of children: N/A  . Years of education: N/A   Occupational History  . Not on file.   Social History Main Topics  . Smoking status: Never Smoker  . Smokeless tobacco: Never Used  . Alcohol use No  . Drug use: No  . Sexual activity: Not on file   Other Topics Concern  . Not on file   Social History Narrative  . No narrative on file    Patient Active Problem List   Diagnosis Date Noted  . Environmental and seasonal allergies 10/30/2014  . Psoriasis vulgaris 10/30/2014  . Essential (primary) hypertension 10/30/2014  . Gastro-esophageal reflux disease without esophagitis 10/30/2014  . Hot flash, menopausal 10/30/2014  . Hyperlipidemia, mixed 10/30/2014  . Primary cancer of female breast ([place]) 10/30/2014  . Abnormal chest x-ray 09/24/2013    Past Surgical History:  Procedure Laterality Date  . BREAST LUMPECTOMY Left 2008  . CHOLECYSTECTOMY    . PARTIAL HYSTERECTOMY      Her family history includes Breast cancer in her sister.     Previous Medications   ALBUTEROL (PROAIR HFA) 108 (90 BASE) MCG/ACT INHALER    Inhale into the lungs every 6 (six) hours as needed for wheezing or shortness of  breath.   ASPIRIN 81 MG CHEWABLE TABLET    Chew 1 tablet by mouth daily. Taking 1/4 tab   CALCIUM CARB-CHOLECALCIFEROL (CALCIUM 600+D3 PO)    Take by mouth.   CYANOCOBALAMIN (VITAMIN B 12 PO)    Take 1,000 mcg by mouth.   FOLIC  ACID (FOLVITE) 1 MG TABLET    Take 1 tablet by mouth daily.   METHOTREXATE (RHEUMATREX) 2.5 MG TABLET    Take 4 tablets by mouth once a week.   METOPROLOL TARTRATE (LOPRESSOR) 25 MG TABLET    take 1/2 tablet by mouth once daily   MULTIPLE VITAMINS-MINERALS (MULTIVITAMIN ADULT PO)    Take by mouth.   OMEGA-3 FATTY ACIDS (FP FISH OIL PO)    Take by mouth.   PROBIOTIC PRODUCT (PROBIOTIC ADVANCED PO)    Take by mouth.    Patient Care Team: Glean Hess, MD as PCP - General (Family Medicine) Dasher, Rayvon Char, MD (Dermatology)      Objective:   Vitals: BP 136/60   Pulse (!) 58   Ht 5\' 2"  (1.575 m)   Wt 132 lb (59.9 kg)   BMI 24.14 kg/m   Physical Exam  Constitutional: She is oriented to person, place, and time. She appears well-developed and well-nourished. No distress.  HENT:  Head: Normocephalic and atraumatic.  Right Ear: Tympanic membrane and ear canal normal.  Left Ear: Tympanic membrane and ear canal normal.  Nose: Right sinus exhibits no maxillary sinus tenderness. Left sinus exhibits no maxillary sinus tenderness.  Mouth/Throat: Uvula is midline and oropharynx is clear and moist.  Eyes: Conjunctivae and EOM are normal. Right eye exhibits no discharge. Left eye exhibits no discharge. No scleral icterus.  Neck: Normal range of motion. Carotid bruit is not present. No erythema present. No thyromegaly present.  Cardiovascular: Normal rate, regular rhythm, normal heart sounds and normal pulses.   Pulmonary/Chest: Effort normal. No respiratory distress. She has decreased breath sounds. She has no wheezes. She has no rhonchi.  Abdominal: Soft. Bowel sounds are normal. There is no hepatosplenomegaly. There is no tenderness. There is no CVA tenderness.  Musculoskeletal: Normal range of motion.  Lymphadenopathy:    She has no cervical adenopathy.    She has no axillary adenopathy.  Neurological: She is alert and oriented to person, place, and time. She has normal reflexes. No  cranial nerve deficit or sensory deficit.  Skin: Skin is warm, dry and intact. No rash noted.  No obvious rash or lesions  Psychiatric: She has a normal mood and affect. Her speech is normal and behavior is normal. Thought content normal.  Nursing note and vitals reviewed.   Activities of Daily Living In your present state of health, do you have any difficulty performing the following activities: 12/29/2016  Hearing? N  Vision? N  Difficulty concentrating or making decisions? N  Walking or climbing stairs? N  Dressing or bathing? N  Doing errands, shopping? N  Preparing Food and eating ? N  Using the Toilet? N  In the past six months, have you accidently leaked urine? N  Do you have problems with loss of bowel control? N  Managing your Medications? N  Managing your Finances? N  Housekeeping or managing your Housekeeping? N  Some recent data might be hidden    Fall Risk Assessment Fall Risk  12/29/2016 12/29/2015 11/25/2014  Falls in the past year? No No No     Depression Screen PHQ 2/9 Scores 12/29/2016 12/29/2015 11/25/2014  PHQ - 2  Score 0 0 0    6CIT Screen 12/29/2016 12/29/2015  What Year? 0 points 0 points  What month? 0 points 0 points  What time? 0 points 0 points  Count back from 20 0 points 0 points  Months in reverse 0 points 0 points  Repeat phrase 0 points 0 points  Total Score 0 0    Medicare Annual Wellness Visit Summary:  Reviewed patient's Family Medical History Reviewed and updated list of patient's medical providers Assessment of cognitive impairment was done Assessed patient's functional ability Established a written schedule for health screening [place] Completed and Reviewed  Exercise Activities and Dietary recommendations Goals    . Prevent Falls          Prevent Falls and remain active.        Immunization History  Administered Date(s) Administered  . Influenza,inj,Quad PF,6+ Mos 11/25/2014  .  Influenza-Unspecified 12/06/2015, 11/25/2016  . Pneumococcal Conjugate-13 11/22/2013  . Pneumococcal Polysaccharide-23 03/08/2000  . Tdap 07/06/2007  . Zoster 03/08/2005    Health Maintenance  Topic Date Due  . TETANUS/TDAP  07/05/2017  . INFLUENZA VACCINE  Completed  . DEXA SCAN  Addressed  . PNA vac Low Risk Adult  Excluded    Discussed health benefits of physical activity, and encouraged her to engage in regular exercise appropriate for her age and condition.    ------------------------------------------------------------------------------------------------------------  Assessment & Plan:   1. Medicare annual wellness visit, subsequent Measures satisfied - POCT urinalysis dipstick  2. Essential (primary) hypertension controlled - CBC with Differential/Platelet - Comprehensive metabolic panel - TSH  3. Gastro-esophageal reflux disease without esophagitis stable - CBC with Differential/Platelet  4. Environmental and seasonal allergies Continue fluticasone and zyrtec - fluticasone (FLONASE) 50 MCG/ACT nasal spray; Place 2 sprays into both nostrils daily.  Dispense: 48 g; Refill: 3  5. Primary cancer of female breast [facility]) Recent mammogram normal  6. Hyperlipidemia, mixed On statin therapy - Lipid panel  7. Need for shingles vaccine - Zoster Vaccine Adjuvanted [facility]) injection; Inject 0.5 mLs into the muscle once.  Dispense: 0.5 mL; Refill: 1   Meds ordered this encounter  Medications  . hydrochlorothiazide (MICROZIDE) 12.5 MG capsule    Sig: Take 1 capsule (12.5 mg total) by mouth daily.    Dispense:  90 capsule    Refill:  3  . fluticasone (FLONASE) 50 MCG/ACT nasal spray    Sig: Place 2 sprays into both nostrils daily.    Dispense:  48 g    Refill:  3  . atorvastatin (LIPITOR) 10 MG tablet    Sig: Take 1 tablet (10 mg total) by mouth daily.    Dispense:  90 tablet    Refill:  3  . Zoster Vaccine Adjuvanted [facility]) injection    Sig: Inject 0.5  mLs into the muscle once.    Dispense:  0.5 mL    Refill:  1    Partially dictated using Editor, commissioning. Any errors are unintentional.  Halina Maidens, MD [facility]  12/29/2016

## 2016-12-30 LAB — CBC WITH DIFFERENTIAL/PLATELET
BASOS ABS: 0 10*3/uL (ref 0.0–0.2)
Basos: 0 %
EOS (ABSOLUTE): 0.3 10*3/uL (ref 0.0–0.4)
Eos: 3 %
Hematocrit: 42.3 % (ref 34.0–46.6)
Hemoglobin: 13.9 g/dL (ref 11.1–15.9)
Immature Grans (Abs): 0 10*3/uL (ref 0.0–0.1)
Immature Granulocytes: 0 %
LYMPHS ABS: 2 10*3/uL (ref 0.7–3.1)
LYMPHS: 22 %
MCH: 29.7 pg (ref 26.6–33.0)
MCHC: 32.9 g/dL (ref 31.5–35.7)
MCV: 90 fL (ref 79–97)
Monocytes Absolute: 0.9 10*3/uL (ref 0.1–0.9)
Monocytes: 10 %
NEUTROS ABS: 5.9 10*3/uL (ref 1.4–7.0)
Neutrophils: 65 %
PLATELETS: 235 10*3/uL (ref 150–379)
RBC: 4.68 x10E6/uL (ref 3.77–5.28)
RDW: 13.9 % (ref 12.3–15.4)
WBC: 9.1 10*3/uL (ref 3.4–10.8)

## 2016-12-30 LAB — COMPREHENSIVE METABOLIC PANEL
ALK PHOS: 86 IU/L (ref 39–117)
ALT: 13 IU/L (ref 0–32)
AST: 26 IU/L (ref 0–40)
Albumin/Globulin Ratio: 1.6 (ref 1.2–2.2)
Albumin: 4.6 g/dL (ref 3.5–4.7)
BILIRUBIN TOTAL: 0.5 mg/dL (ref 0.0–1.2)
BUN/Creatinine Ratio: 26 (ref 12–28)
BUN: 18 mg/dL (ref 8–27)
CHLORIDE: 100 mmol/L (ref 96–106)
CO2: 24 mmol/L (ref 20–29)
Calcium: 9.6 mg/dL (ref 8.7–10.3)
Creatinine, Ser: 0.7 mg/dL (ref 0.57–1.00)
GFR calc Af Amer: 94 mL/min/{1.73_m2} (ref >59)
GFR calc non Af Amer: 82 mL/min/{1.73_m2} (ref >59)
GLUCOSE: 70 mg/dL (ref 65–99)
Globulin, Total: 2.9 g/dL (ref 1.5–4.5)
Potassium: 4.2 mmol/L (ref 3.5–5.2)
Sodium: 144 mmol/L (ref 134–144)
Total Protein: 7.5 g/dL (ref 6.0–8.5)

## 2016-12-30 LAB — LIPID PANEL
CHOLESTEROL TOTAL: 194 mg/dL (ref 100–199)
Chol/HDL Ratio: 2.3 ratio (ref 0.0–4.4)
HDL: 85 mg/dL (ref >39)
LDL Calculated: 88 mg/dL (ref 0–99)
TRIGLYCERIDES: 103 mg/dL (ref 0–149)
VLDL CHOLESTEROL CAL: 21 mg/dL (ref 5–40)

## 2016-12-30 LAB — TSH: TSH: 1.81 u[IU]/mL (ref 0.450–4.500)

## 2017-02-07 ENCOUNTER — Ambulatory Visit
Admission: RE | Admit: 2017-02-07 | Discharge: 2017-02-07 | Disposition: A | Discharge status: Home / Self Care | Payer: Medicare Other | Source: Ambulatory Visit | Attending: Specialist | Admitting: Specialist

## 2017-02-07 DIAGNOSIS — I700 Atherosclerosis of aorta: Secondary | ICD-10-CM | POA: Diagnosis not present

## 2017-02-07 DIAGNOSIS — J479 Bronchiectasis, uncomplicated: Secondary | ICD-10-CM | POA: Diagnosis not present

## 2017-02-07 DIAGNOSIS — R918 Other nonspecific abnormal finding of lung field: Secondary | ICD-10-CM | POA: Insufficient documentation

## 2017-02-07 DIAGNOSIS — I2510 Atherosclerotic heart disease of native coronary artery without angina pectoris: Secondary | ICD-10-CM | POA: Insufficient documentation

## 2017-02-07 DIAGNOSIS — K449 Diaphragmatic hernia without obstruction or gangrene: Secondary | ICD-10-CM | POA: Diagnosis not present

## 2017-06-29 ENCOUNTER — Encounter: Payer: Self-pay | Admitting: Internal Medicine

## 2017-06-29 ENCOUNTER — Ambulatory Visit: Payer: Medicare Other | Admitting: Internal Medicine

## 2017-06-29 VITALS — BP 124/78 | HR 66 | Ht 62.0 in | Wt 124.0 lb

## 2017-06-29 DIAGNOSIS — K219 Gastro-esophageal reflux disease without esophagitis: Secondary | ICD-10-CM

## 2017-06-29 DIAGNOSIS — I10 Essential (primary) hypertension: Secondary | ICD-10-CM | POA: Diagnosis not present

## 2017-06-29 MED ORDER — METOPROLOL TARTRATE 25 MG PO TABS: 12.5000 mg | ORAL_TABLET | Freq: Every day | ORAL | 6 refills | Status: DC

## 2017-06-29 NOTE — Progress Notes (Signed)
Date:  06/29/2017   Name:  [name]   DOB:  1933/06/30   MRN:  [id number]   Chief Complaint: Hypertension Hypertension  This is a chronic problem. The problem is controlled. Pertinent negatives include no chest pain, headaches, palpitations or shortness of breath. Past treatments include diuretics and beta blockers. The current treatment provides significant improvement.  Gastroesophageal Reflux  She complains of heartburn. She reports no abdominal pain, no chest pain or no coughing. The problem occurs occasionally. Progression since onset: recently worse but not taking zantac daily. Pertinent negatives include no fatigue. Risk factors include hiatal hernia. She has tried a histamine-2 antagonist for the symptoms.    Review of Systems  Constitutional: Negative for appetite change, fatigue, fever and unexpected weight change.  HENT: Negative for tinnitus and trouble swallowing.   Eyes: Negative for visual disturbance.  Respiratory: Negative for cough, chest tightness and shortness of breath.   Cardiovascular: Negative for chest pain, palpitations and leg swelling.  Gastrointestinal: Positive for heartburn. Negative for abdominal pain.       Gerd  Genitourinary: Negative for dysuria and hematuria.  Musculoskeletal: Negative for arthralgias.  Skin:       Intermittent psoriasis flares   Neurological: Negative for tremors, numbness and headaches.  Psychiatric/Behavioral: Negative for dysphoric mood and sleep disturbance.    Patient Active Problem List   Diagnosis Date Noted  . Environmental and seasonal allergies 10/30/2014  . Psoriasis vulgaris 10/30/2014  . Essential (primary) hypertension 10/30/2014  . Gastro-esophageal reflux disease without esophagitis 10/30/2014  . Hot flash, menopausal 10/30/2014  . Hyperlipidemia, mixed 10/30/2014  . Primary cancer of female breast ([place]) 10/30/2014  . Abnormal chest x-ray 09/24/2013    Prior to Admission medications   Medication  Sig Start Date End Date Taking? Authorizing Provider  Ascorbic Acid (VITAMIN C) 100 MG tablet Take 100 mg by mouth daily.   Yes [provider]  aspirin 81 MG chewable tablet Chew 1 tablet by mouth daily. Taking 1/4 tab   Yes [provider]  atorvastatin (LIPITOR) 10 MG tablet Take 1 tablet (10 mg total) by mouth daily. 12/29/16  Yes Glean Hess, MD  Calcium Carb-Cholecalciferol (CALCIUM 600+D3 PO) Take by mouth.   Yes [provider]  Cyanocobalamin (VITAMIN B 12 PO) Take 1,000 mcg by mouth.   Yes [provider]  fluticasone (FLONASE) 50 MCG/ACT nasal spray Place 2 sprays into both nostrils daily. 12/29/16  Yes Glean Hess, MD  folic acid (FOLVITE) 1 MG tablet Take 1 tablet by mouth daily. 08/27/14  Yes [provider]  hydrochlorothiazide (MICROZIDE) 12.5 MG capsule Take 1 capsule (12.5 mg total) by mouth daily. 12/29/16  Yes Glean Hess, MD  methotrexate (RHEUMATREX) 2.5 MG tablet Take 4 tablets by mouth once a week. 10/20/14  Yes [provider]  metoprolol tartrate (LOPRESSOR) 25 MG tablet take 1/2 tablet by mouth once daily 06/21/16  Yes Glean Hess, MD  Multiple Vitamins-Minerals (MULTIVITAMIN ADULT PO) Take by mouth.   Yes [provider]  Omega-3 Fatty Acids (FP FISH OIL PO) Take by mouth.   Yes [provider]  Probiotic Product (PROBIOTIC ADVANCED PO) Take by mouth.   Yes [provider]  albuterol (PROAIR HFA) 108 (90 Base) MCG/ACT inhaler Inhale into the lungs every 6 (six) hours as needed for wheezing or shortness of breath.    [provider]    No Known Allergies  Past Surgical History:  Procedure Laterality Date  .  BREAST LUMPECTOMY Left 2008  . CHOLECYSTECTOMY    . PARTIAL HYSTERECTOMY      Social History   Tobacco Use  . Smoking status: Never Smoker  . Smokeless tobacco: Never Used  Substance Use Topics  . Alcohol use: No    Alcohol/week: 1.2 oz     Types: 2 Standard drinks or equivalent per week  . Drug use: No     Medication list has been reviewed and updated.  PHQ 2/9 Scores 12/29/2016 12/29/2015 11/25/2014  PHQ - 2 Score 0 0 0    Physical Exam  Constitutional: She is oriented to person, place, and time. She appears well-developed. No distress.  HENT:  Head: Normocephalic and atraumatic.  Neck: Normal range of motion. Neck supple.  Cardiovascular: Normal rate, regular rhythm and normal heart sounds.  Pulmonary/Chest: Effort normal and breath sounds normal. No respiratory distress. She has no wheezes.  Abdominal: Soft. Bowel sounds are normal. There is no tenderness. There is no rebound and no guarding.  Musculoskeletal: Normal range of motion.  Lymphadenopathy:    She has no cervical adenopathy.  Neurological: She is alert and oriented to person, place, and time.  Skin: Skin is warm and dry.  Psychiatric: She has a normal mood and affect. Her behavior is normal. Thought content normal.    BP 124/78   Pulse 66   Ht 5\' 2"  (1.575 m)   Wt 124 lb (56.2 kg)   SpO2 96%   BMI 22.68 kg/m   Assessment and Plan: 1. Essential (primary) hypertension Controlled, continue current therapy  2. Gastro-esophageal reflux disease without esophagitis Take zantac daily for 1-2 weeks   Meds ordered this encounter  Medications  . metoprolol tartrate (LOPRESSOR) 25 MG tablet    Sig: Take 0.5 tablets (12.5 mg total) by mouth daily.    Dispense:  30 tablet    Refill:  6    Partially dictated using Editor, commissioning. Any errors are unintentional.  Halina Maidens, MD [facility]  06/29/2017

## 2017-07-10 ENCOUNTER — Other Ambulatory Visit
Admission: RE | Admit: 2017-07-10 | Discharge: 2017-07-10 | Disposition: A | Discharge status: Home / Self Care | Payer: Medicare Other | Source: Ambulatory Visit | Attending: Internal Medicine | Admitting: Internal Medicine

## 2017-07-10 DIAGNOSIS — R0789 Other chest pain: Secondary | ICD-10-CM | POA: Insufficient documentation

## 2017-07-10 LAB — FIBRIN DERIVATIVES D-DIMER (ARMC ONLY): Fibrin derivatives D-dimer (ARMC): 730.05 ng/mL (FEU) — ABNORMAL HIGH (ref 0.00–499.00)

## 2017-09-19 ENCOUNTER — Other Ambulatory Visit: Payer: Self-pay | Admitting: Internal Medicine

## 2017-09-19 DIAGNOSIS — Z1231 Encounter for screening mammogram for malignant neoplasm of breast: Secondary | ICD-10-CM

## 2017-10-12 DIAGNOSIS — C44310 Basal cell carcinoma of skin of unspecified parts of face: Secondary | ICD-10-CM | POA: Insufficient documentation

## 2017-10-13 LAB — HM MAMMOGRAPHY

## 2017-12-12 ENCOUNTER — Other Ambulatory Visit: Payer: Self-pay | Admitting: Internal Medicine

## 2017-12-12 DIAGNOSIS — J3089 Other allergic rhinitis: Secondary | ICD-10-CM

## 2017-12-13 ENCOUNTER — Encounter: Payer: Self-pay | Admitting: Internal Medicine

## 2018-01-01 ENCOUNTER — Ambulatory Visit: Payer: Self-pay

## 2018-01-02 ENCOUNTER — Encounter: Payer: Self-pay | Admitting: Internal Medicine

## 2018-01-04 ENCOUNTER — Ambulatory Visit: Payer: Self-pay

## 2018-01-04 ENCOUNTER — Ambulatory Visit (INDEPENDENT_AMBULATORY_CARE_PROVIDER_SITE_OTHER): Payer: Medicare Other | Admitting: Internal Medicine

## 2018-01-04 ENCOUNTER — Encounter: Payer: Self-pay | Admitting: Internal Medicine

## 2018-01-04 VITALS — BP 128/60 | HR 67 | Ht 62.0 in | Wt 128.0 lb

## 2018-01-04 DIAGNOSIS — E782 Mixed hyperlipidemia: Secondary | ICD-10-CM | POA: Diagnosis not present

## 2018-01-04 DIAGNOSIS — I10 Essential (primary) hypertension: Secondary | ICD-10-CM | POA: Diagnosis not present

## 2018-01-04 DIAGNOSIS — J479 Bronchiectasis, uncomplicated: Secondary | ICD-10-CM

## 2018-01-04 DIAGNOSIS — K219 Gastro-esophageal reflux disease without esophagitis: Secondary | ICD-10-CM

## 2018-01-04 DIAGNOSIS — Z0000 Encounter for general adult medical examination without abnormal findings: Secondary | ICD-10-CM | POA: Diagnosis not present

## 2018-01-04 DIAGNOSIS — L400 Psoriasis vulgaris: Secondary | ICD-10-CM

## 2018-01-04 LAB — POCT URINALYSIS DIPSTICK
Bilirubin, UA: NEGATIVE
Blood, UA: NEGATIVE
GLUCOSE UA: NEGATIVE
Ketones, UA: NEGATIVE
NITRITE UA: NEGATIVE
PROTEIN UA: NEGATIVE
SPEC GRAV UA: 1.01 (ref 1.010–1.025)
Urobilinogen, UA: 0.2 E.U./dL
pH, UA: 6 (ref 5.0–8.0)

## 2018-01-04 MED ORDER — HYDROCHLOROTHIAZIDE 12.5 MG PO CAPS: 12.5000 mg | ORAL_CAPSULE | Freq: Every day | ORAL | 3 refills | Status: DC

## 2018-01-04 NOTE — Patient Instructions (Signed)
Pepcid 10 or 20 mg - over the counter substitute for Ranitidine.

## 2018-01-04 NOTE — Progress Notes (Signed)
Date:  01/04/2018   Name:  [name]   DOB:  1933-08-22   MRN:  [id number]   Chief Complaint: Annual Exam (Breast Exam. Schedule MAW) [name] is a 84 y.o. female who presents today for her Complete Annual Exam. She feels fairly well. She reports exercising walking and golfing. She reports she is sleeping fairly well.  She had a mammogram in August.  She is due for a influenza vaccine.  Hypertension  This is a chronic problem. The problem is controlled. Associated symptoms include shortness of breath. Pertinent negatives include no chest pain, headaches or palpitations. Past treatments include diuretics and beta blockers. The current treatment provides significant improvement.  Hyperlipidemia  This is a chronic problem. The problem is controlled. Associated symptoms include myalgias and shortness of breath. Pertinent negatives include no chest pain. Current antihyperlipidemic treatment includes statins. The current treatment provides significant improvement of lipids.  Gastroesophageal Reflux  She complains of globus sensation and heartburn. She reports no abdominal pain, no chest pain, no coughing or no wheezing. This is a recurrent problem. The problem occurs occasionally. Pertinent negatives include no fatigue. She has tried a histamine-2 antagonist for the symptoms. The treatment provided moderate relief.  Psoriasis - she continues on weekly MTX with excellent control of skin lesions.  She denies n/v/headache, recent infections. Bronchiectasis/SOB - she continues with baseline SOB with most activities but she says that she is not limited in what she does.  She still plays golf, does her housework and walks.  She sees Pulmonary - had recent follow CT scan which was stable.  She has albuterol MDI to use if needed but she does not find any benefit from it.  Review of Systems  Constitutional: Negative for chills, fatigue and fever.  HENT: Positive for trouble swallowing. Negative for  congestion, hearing loss, tinnitus and voice change.   Eyes: Negative for visual disturbance.  Respiratory: Positive for shortness of breath. Negative for cough, chest tightness and wheezing.   Cardiovascular: Negative for chest pain, palpitations and leg swelling.  Gastrointestinal: Positive for heartburn. Negative for abdominal pain, blood in stool, constipation, diarrhea and vomiting.  Endocrine: Negative for polydipsia and polyuria.  Genitourinary: Negative for dysuria, frequency, genital sores, hematuria, vaginal bleeding and vaginal discharge.  Musculoskeletal: Positive for myalgias. Negative for arthralgias, gait problem and joint swelling.  Skin: Negative for color change and rash.  Allergic/Immunologic: Negative for environmental allergies.  Neurological: Negative for dizziness, tremors, light-headedness and headaches.  Hematological: Negative for adenopathy. Does not bruise/bleed easily.  Psychiatric/Behavioral: Negative for dysphoric mood and sleep disturbance. The patient is not nervous/anxious.     Patient Active Problem List   Diagnosis Date Noted  . Environmental and seasonal allergies 10/30/2014  . Psoriasis vulgaris 10/30/2014  . Essential (primary) hypertension 10/30/2014  . Gastro-esophageal reflux disease without esophagitis 10/30/2014  . Hot flash, menopausal 10/30/2014  . Hyperlipidemia, mixed 10/30/2014  . History of cancer of right breast 10/30/2014  . Bronchiectasis ([facility]) 09/24/2013    No Known Allergies  Past Surgical History:  Procedure Laterality Date  . BREAST LUMPECTOMY Left 2008  . CHOLECYSTECTOMY    . PARTIAL HYSTERECTOMY      Social History   Tobacco Use  . Smoking status: Never Smoker  . Smokeless tobacco: Never Used  Substance Use Topics  . Alcohol use: No    Alcohol/week: 2.0 standard drinks    Types: 2 Standard drinks or equivalent per week  . Drug use: No  Medication list has been reviewed and updated.  Current Meds    Medication Sig  . Ascorbic Acid (VITAMIN C) 100 MG tablet Take 100 mg by mouth daily.  Marland Kitchen aspirin 81 MG chewable tablet Chew 1 tablet by mouth daily. Taking 1/4 tab  . atorvastatin (LIPITOR) 10 MG tablet TAKE 1 TABLET(10 MG) BY MOUTH DAILY  . Calcium Carb-Cholecalciferol (CALCIUM 600+D3 PO) Take by mouth.  . Cyanocobalamin (VITAMIN B 12 PO) Take 1,000 mcg by mouth.  . fluticasone (FLONASE) 50 MCG/ACT nasal spray SHAKE LIQUID AND USE 2 SPRAYS IN EACH NOSTRIL DAILY  . folic acid (FOLVITE) 1 MG tablet Take 1 tablet by mouth daily.  . hydrochlorothiazide (MICROZIDE) 12.5 MG capsule Take 1 capsule (12.5 mg total) by mouth daily.  . methotrexate (RHEUMATREX) 2.5 MG tablet Take 4 tablets by mouth once a week.  . metoprolol tartrate (LOPRESSOR) 25 MG tablet Take 0.5 tablets (12.5 mg total) by mouth daily.  . Multiple Vitamins-Minerals (MULTIVITAMIN ADULT PO) Take by mouth.  . Omega-3 Fatty Acids (FP FISH OIL PO) Take by mouth.  . Probiotic Product (PROBIOTIC ADVANCED PO) Take by mouth.    PHQ 2/9 Scores 01/04/2018 12/29/2016 12/29/2015 11/25/2014  PHQ - 2 Score 0 0 0 0   Fall Risk  01/04/2018 12/29/2016 12/29/2015 11/25/2014  Falls in the past year? No No No No   Functional Status Survey: Is the patient deaf or have difficulty hearing?: No Does the patient have difficulty seeing, even when wearing glasses/contacts?: No Does the patient have difficulty concentrating, remembering, or making decisions?: No Does the patient have difficulty walking or climbing stairs?: No Does the patient have difficulty dressing or bathing?: No Does the patient have difficulty doing errands alone such as visiting a doctor's office or shopping?: No   Physical Exam  Constitutional: She is oriented to person, place, and time. She appears well-developed and well-nourished. No distress.  HENT:  Head: Normocephalic and atraumatic.  Right Ear: Tympanic membrane and ear canal normal.  Left Ear: Tympanic membrane and  ear canal normal.  Nose: Right sinus exhibits no maxillary sinus tenderness. Left sinus exhibits no maxillary sinus tenderness.  Mouth/Throat: Uvula is midline and oropharynx is clear and moist.  Eyes: Conjunctivae and EOM are normal. Right eye exhibits no discharge. Left eye exhibits no discharge. No scleral icterus.  Neck: Normal range of motion. Carotid bruit is not present. No erythema present. No thyromegaly present.  Cardiovascular: Normal rate, regular rhythm, normal heart sounds and normal pulses.  Pulmonary/Chest: Effort normal. No respiratory distress. She has no wheezes. Right breast exhibits no mass, no nipple discharge, no skin change and no tenderness. Left breast exhibits no mass, no nipple discharge, no skin change and no tenderness.  Abdominal: Soft. Bowel sounds are normal. There is no hepatosplenomegaly. There is no tenderness. There is no CVA tenderness.  Musculoskeletal: Normal range of motion. She exhibits no edema.  Lymphadenopathy:    She has no cervical adenopathy.    She has no axillary adenopathy.  Neurological: She is alert and oriented to person, place, and time. She has normal reflexes. No cranial nerve deficit or sensory deficit.  Skin: Skin is warm, dry and intact.  Psychiatric: She has a normal mood and affect. Her speech is normal and behavior is normal. Thought content normal.  Nursing note and vitals reviewed.   BP 128/60 (BP Location: Right Arm, Patient Position: Sitting, Cuff Size: Normal)   Pulse 67   Ht 5\' 2"  (1.575 m)   Wt 128  lb (58.1 kg)   SpO2 98%   BMI 23.41 kg/m   Assessment and Plan: 1. Annual physical exam Vaccinations are up to date Mammogram in August Aged out of colonoscopies - POCT urinalysis dipstick  2. Bronchiectasis without complication (HCC) Stable, follow up with Pulmonary  3. Essential (primary) hypertension controlled - CBC with Differential/Platelet - Comprehensive metabolic panel - TSH - hydrochlorothiazide  (MICROZIDE) 12.5 MG capsule; Take 1 capsule (12.5 mg total) by mouth daily.  Dispense: 90 capsule; Refill: 3  4. Gastro-esophageal reflux disease without esophagitis Now off Ranitidine - recommend otc Pepcid If esophageal discomfort with swallowing worsens, returns - CBC with Differential/Platelet  5. Hyperlipidemia, mixed On statin therapy - Lipid panel  6. Psoriasis vulgaris On MTX Labs today will be forwarded to Dermatology   Partially dictated using Dragon software. Any errors are unintentional.  Halina Maidens, MD [facility]  01/04/2018

## 2018-01-05 LAB — LIPID PANEL
CHOL/HDL RATIO: 2.2 ratio (ref 0.0–4.4)
Cholesterol, Total: 204 mg/dL — ABNORMAL HIGH (ref 100–199)
HDL: 91 mg/dL (ref >39)
LDL Calculated: 91 mg/dL (ref 0–99)
Triglycerides: 110 mg/dL (ref 0–149)
VLDL Cholesterol Cal: 22 mg/dL (ref 5–40)

## 2018-01-05 LAB — CBC WITH DIFFERENTIAL/PLATELET
BASOS ABS: 0 10*3/uL (ref 0.0–0.2)
Basos: 0 %
EOS (ABSOLUTE): 0.3 10*3/uL (ref 0.0–0.4)
EOS: 3 %
Hematocrit: 41.9 % (ref 34.0–46.6)
Hemoglobin: 14.4 g/dL (ref 11.1–15.9)
Immature Grans (Abs): 0 10*3/uL (ref 0.0–0.1)
Immature Granulocytes: 0 %
Lymphocytes Absolute: 2.4 10*3/uL (ref 0.7–3.1)
Lymphs: 26 %
MCH: 30.8 pg (ref 26.6–33.0)
MCHC: 34.4 g/dL (ref 31.5–35.7)
MCV: 90 fL (ref 79–97)
MONOCYTES: 8 %
Monocytes Absolute: 0.8 10*3/uL (ref 0.1–0.9)
NEUTROS ABS: 5.7 10*3/uL (ref 1.4–7.0)
Neutrophils: 63 %
PLATELETS: 202 10*3/uL (ref 150–450)
RBC: 4.67 x10E6/uL (ref 3.77–5.28)
RDW: 13.1 % (ref 12.3–15.4)
WBC: 9.2 10*3/uL (ref 3.4–10.8)

## 2018-01-05 LAB — COMPREHENSIVE METABOLIC PANEL
A/G RATIO: 1.8 (ref 1.2–2.2)
ALT: 15 IU/L (ref 0–32)
AST: 22 IU/L (ref 0–40)
Albumin: 4.7 g/dL (ref 3.5–4.7)
Alkaline Phosphatase: 82 IU/L (ref 39–117)
BILIRUBIN TOTAL: 0.4 mg/dL (ref 0.0–1.2)
BUN / CREAT RATIO: 25 (ref 12–28)
BUN: 21 mg/dL (ref 8–27)
CALCIUM: 9.6 mg/dL (ref 8.7–10.3)
CHLORIDE: 105 mmol/L (ref 96–106)
CO2: 20 mmol/L (ref 20–29)
Creatinine, Ser: 0.83 mg/dL (ref 0.57–1.00)
GFR, EST AFRICAN AMERICAN: 76 mL/min/{1.73_m2} (ref >59)
GFR, EST NON AFRICAN AMERICAN: 66 mL/min/{1.73_m2} (ref >59)
GLOBULIN, TOTAL: 2.6 g/dL (ref 1.5–4.5)
Glucose: 86 mg/dL (ref 65–99)
POTASSIUM: 4.3 mmol/L (ref 3.5–5.2)
SODIUM: 144 mmol/L (ref 134–144)
TOTAL PROTEIN: 7.3 g/dL (ref 6.0–8.5)

## 2018-01-05 LAB — TSH: TSH: 3.27 u[IU]/mL (ref 0.450–4.500)

## 2018-03-11 ENCOUNTER — Other Ambulatory Visit: Payer: Self-pay | Admitting: Internal Medicine

## 2018-03-11 DIAGNOSIS — J3089 Other allergic rhinitis: Secondary | ICD-10-CM

## 2018-04-25 ENCOUNTER — Other Ambulatory Visit: Payer: Self-pay | Admitting: Internal Medicine

## 2018-06-20 ENCOUNTER — Inpatient Hospital Stay (HOSPITAL_COMMUNITY)
Admission: AD | Admit: 2018-06-20 | Discharge: 2018-06-22 | DRG: 179 | Disposition: A | Discharge status: Home / Self Care | Payer: Medicare Other | Source: Other Acute Inpatient Hospital | Attending: Internal Medicine | Admitting: Internal Medicine

## 2018-06-20 ENCOUNTER — Encounter (HOSPITAL_COMMUNITY): Payer: Self-pay

## 2018-06-20 ENCOUNTER — Encounter: Payer: Self-pay | Admitting: Emergency Medicine

## 2018-06-20 ENCOUNTER — Emergency Department: Payer: Medicare Other

## 2018-06-20 ENCOUNTER — Emergency Department
Admission: EM | Admit: 2018-06-20 | Discharge: 2018-06-20 | Disposition: A | Discharge status: Other Acute Inpatient Hospital | Payer: Medicare Other | Attending: Emergency Medicine | Admitting: Emergency Medicine

## 2018-06-20 ENCOUNTER — Other Ambulatory Visit: Payer: Self-pay

## 2018-06-20 DIAGNOSIS — E782 Mixed hyperlipidemia: Secondary | ICD-10-CM | POA: Diagnosis present

## 2018-06-20 DIAGNOSIS — Z7982 Long term (current) use of aspirin: Secondary | ICD-10-CM | POA: Insufficient documentation

## 2018-06-20 DIAGNOSIS — J479 Bronchiectasis, uncomplicated: Secondary | ICD-10-CM | POA: Diagnosis not present

## 2018-06-20 DIAGNOSIS — E876 Hypokalemia: Secondary | ICD-10-CM | POA: Diagnosis not present

## 2018-06-20 DIAGNOSIS — R7989 Other specified abnormal findings of blood chemistry: Secondary | ICD-10-CM | POA: Diagnosis present

## 2018-06-20 DIAGNOSIS — Z79899 Other long term (current) drug therapy: Secondary | ICD-10-CM | POA: Diagnosis not present

## 2018-06-20 DIAGNOSIS — Z853 Personal history of malignant neoplasm of breast: Secondary | ICD-10-CM | POA: Diagnosis not present

## 2018-06-20 DIAGNOSIS — I248 Other forms of acute ischemic heart disease: Secondary | ICD-10-CM | POA: Insufficient documentation

## 2018-06-20 DIAGNOSIS — J302 Other seasonal allergic rhinitis: Secondary | ICD-10-CM | POA: Diagnosis present

## 2018-06-20 DIAGNOSIS — Z90711 Acquired absence of uterus with remaining cervical stump: Secondary | ICD-10-CM | POA: Diagnosis not present

## 2018-06-20 DIAGNOSIS — K219 Gastro-esophageal reflux disease without esophagitis: Secondary | ICD-10-CM | POA: Diagnosis present

## 2018-06-20 DIAGNOSIS — E860 Dehydration: Secondary | ICD-10-CM | POA: Diagnosis not present

## 2018-06-20 DIAGNOSIS — Z85828 Personal history of other malignant neoplasm of skin: Secondary | ICD-10-CM | POA: Diagnosis not present

## 2018-06-20 DIAGNOSIS — R112 Nausea with vomiting, unspecified: Secondary | ICD-10-CM | POA: Diagnosis present

## 2018-06-20 DIAGNOSIS — Z803 Family history of malignant neoplasm of breast: Secondary | ICD-10-CM | POA: Diagnosis not present

## 2018-06-20 DIAGNOSIS — R9431 Abnormal electrocardiogram [ECG] [EKG]: Secondary | ICD-10-CM | POA: Diagnosis present

## 2018-06-20 DIAGNOSIS — J3089 Other allergic rhinitis: Secondary | ICD-10-CM | POA: Diagnosis present

## 2018-06-20 DIAGNOSIS — R197 Diarrhea, unspecified: Secondary | ICD-10-CM

## 2018-06-20 DIAGNOSIS — Z66 Do not resuscitate: Secondary | ICD-10-CM | POA: Diagnosis not present

## 2018-06-20 DIAGNOSIS — I10 Essential (primary) hypertension: Secondary | ICD-10-CM | POA: Diagnosis not present

## 2018-06-20 DIAGNOSIS — R0902 Hypoxemia: Secondary | ICD-10-CM

## 2018-06-20 DIAGNOSIS — R740 Nonspecific elevation of levels of transaminase and lactic acid dehydrogenase [LDH]: Secondary | ICD-10-CM | POA: Diagnosis not present

## 2018-06-20 DIAGNOSIS — L400 Psoriasis vulgaris: Secondary | ICD-10-CM | POA: Diagnosis present

## 2018-06-20 HISTORY — DX: Essential (primary) hypertension: I10

## 2018-06-20 HISTORY — DX: Gastro-esophageal reflux disease without esophagitis: K21.9

## 2018-06-20 HISTORY — DX: Bronchiectasis, uncomplicated: J47.9

## 2018-06-20 HISTORY — DX: Nausea with vomiting, unspecified: R11.2

## 2018-06-20 HISTORY — DX: Hyperlipidemia, unspecified: E78.5

## 2018-06-20 HISTORY — DX: Malignant (primary) neoplasm, unspecified: C80.1

## 2018-06-20 LAB — D-DIMER, QUANTITATIVE: D-Dimer, Quant: 1.27 ug/mL-FEU — ABNORMAL HIGH (ref 0.00–0.50)

## 2018-06-20 LAB — CBC WITH DIFFERENTIAL/PLATELET
Abs Immature Granulocytes: 0.01 10*3/uL (ref 0.00–0.07)
Basophils Absolute: 0 10*3/uL (ref 0.0–0.1)
Basophils Relative: 0 %
Eosinophils Absolute: 0 10*3/uL (ref 0.0–0.5)
Eosinophils Relative: 0 %
HCT: 43.6 % (ref 36.0–46.0)
Hemoglobin: 14.7 g/dL (ref 12.0–15.0)
Immature Granulocytes: 0 %
Lymphocytes Relative: 17 %
Lymphs Abs: 1.1 10*3/uL (ref 0.7–4.0)
MCH: 30.4 pg (ref 26.0–34.0)
MCHC: 33.7 g/dL (ref 30.0–36.0)
MCV: 90.1 fL (ref 80.0–100.0)
Monocytes Absolute: 0.4 10*3/uL (ref 0.1–1.0)
Monocytes Relative: 7 %
Neutro Abs: 4.8 10*3/uL (ref 1.7–7.7)
Neutrophils Relative %: 76 %
Platelets: 158 10*3/uL (ref 150–400)
RBC: 4.84 MIL/uL (ref 3.87–5.11)
RDW: 12.7 % (ref 11.5–15.5)
WBC: 6.3 10*3/uL (ref 4.0–10.5)
nRBC: 0 % (ref 0.0–0.2)

## 2018-06-20 LAB — ABO/RH: ABO/RH(D): O POS

## 2018-06-20 LAB — COMPREHENSIVE METABOLIC PANEL
ALT: 89 U/L — ABNORMAL HIGH (ref 0–44)
AST: 139 U/L — ABNORMAL HIGH (ref 15–41)
Albumin: 3.9 g/dL (ref 3.5–5.0)
Alkaline Phosphatase: 130 U/L — ABNORMAL HIGH (ref 38–126)
Anion gap: 12 (ref 5–15)
BUN: 15 mg/dL (ref 8–23)
CO2: 22 mmol/L (ref 22–32)
Calcium: 8.4 mg/dL — ABNORMAL LOW (ref 8.9–10.3)
Chloride: 105 mmol/L (ref 98–111)
Creatinine, Ser: 0.62 mg/dL (ref 0.44–1.00)
GFR calc Af Amer: >60 mL/min (ref >60)
GFR calc non Af Amer: >60 mL/min (ref >60)
Glucose, Bld: 135 mg/dL — ABNORMAL HIGH (ref 70–99)
Potassium: 3.7 mmol/L (ref 3.5–5.1)
Sodium: 139 mmol/L (ref 135–145)
Total Bilirubin: 0.6 mg/dL (ref 0.3–1.2)
Total Protein: 7.4 g/dL (ref 6.5–8.1)

## 2018-06-20 LAB — LACTATE DEHYDROGENASE: LDH: 224 U/L — ABNORMAL HIGH (ref 98–192)

## 2018-06-20 LAB — SEDIMENTATION RATE: Sed Rate: 24 mm/hr — ABNORMAL HIGH (ref 0–22)

## 2018-06-20 LAB — LACTIC ACID, PLASMA: Lactic Acid, Venous: 1.5 mmol/L (ref 0.5–1.9)

## 2018-06-20 LAB — TRIGLYCERIDES: Triglycerides: 88 mg/dL (ref <150)

## 2018-06-20 LAB — TROPONIN I
Troponin I: 0.03 ng/mL (ref <0.03)
Troponin I: 0.09 ng/mL (ref <0.03)
Troponin I: 0.14 ng/mL (ref <0.03)

## 2018-06-20 LAB — C-REACTIVE PROTEIN: CRP: 0.9 mg/dL (ref <1.0)

## 2018-06-20 LAB — FIBRINOGEN: Fibrinogen: 458 mg/dL (ref 210–475)

## 2018-06-20 LAB — FERRITIN: Ferritin: 340 ng/mL — ABNORMAL HIGH (ref 11–307)

## 2018-06-20 LAB — PROCALCITONIN: Procalcitonin: <0.1 ng/mL

## 2018-06-20 MED ORDER — SODIUM CHLORIDE 0.9 % IV SOLN
INTRAVENOUS | Status: DC
  Administered 2018-06-20: 18:00:00 via INTRAVENOUS

## 2018-06-20 MED ORDER — ATORVASTATIN CALCIUM 10 MG PO TABS
10.0000 mg | ORAL_TABLET | ORAL | Status: DC
  Administered 2018-06-22: 10 mg via ORAL
  Filled 2018-06-20 (×3): qty 1

## 2018-06-20 MED ORDER — SODIUM CHLORIDE 0.9 % IV BOLUS
1000.0000 mL | Freq: Once | INTRAVENOUS | Status: AC
  Administered 2018-06-20: 1000 mL via INTRAVENOUS

## 2018-06-20 MED ORDER — HYDROCHLOROTHIAZIDE 12.5 MG PO CAPS
12.5000 mg | ORAL_CAPSULE | Freq: Once | ORAL | Status: DC
  Filled 2018-06-20: qty 1

## 2018-06-20 MED ORDER — ONDANSETRON HCL 4 MG/2ML IJ SOLN
4.0000 mg | Freq: Once | INTRAMUSCULAR | Status: AC
  Administered 2018-06-20: 13:00:00 4 mg via INTRAVENOUS
  Filled 2018-06-20: qty 2

## 2018-06-20 MED ORDER — METOCLOPRAMIDE HCL 5 MG/ML IJ SOLN
10.0000 mg | Freq: Once | INTRAMUSCULAR | Status: AC
  Administered 2018-06-20: 10 mg via INTRAVENOUS
  Filled 2018-06-20: qty 2

## 2018-06-20 MED ORDER — ONDANSETRON HCL 4 MG/2ML IJ SOLN
4.0000 mg | Freq: Once | INTRAMUSCULAR | Status: AC
  Administered 2018-06-20: 4 mg via INTRAVENOUS
  Filled 2018-06-20: qty 2

## 2018-06-20 MED ORDER — ONDANSETRON HCL 4 MG/2ML IJ SOLN
4.0000 mg | Freq: Four times a day (QID) | INTRAMUSCULAR | Status: DC | PRN
  Administered 2018-06-20 – 2018-06-21 (×2): 4 mg via INTRAVENOUS
  Filled 2018-06-20 (×2): qty 2

## 2018-06-20 MED ORDER — FLUTICASONE PROPIONATE 50 MCG/ACT NA SUSP: 2.0000 | Freq: Every day | NASAL | Status: DC | PRN

## 2018-06-20 MED ORDER — VITAMIN C 250 MG PO TABS
125.0000 mg | ORAL_TABLET | Freq: Every day | ORAL | Status: DC
  Administered 2018-06-21 – 2018-06-22 (×2): 125 mg via ORAL
  Filled 2018-06-20 (×3): qty 1

## 2018-06-20 MED ORDER — LORATADINE 10 MG PO TABS
10.0000 mg | ORAL_TABLET | Freq: Every day | ORAL | Status: DC
  Administered 2018-06-21 – 2018-06-22 (×2): 10 mg via ORAL
  Filled 2018-06-20 (×3): qty 1

## 2018-06-20 MED ORDER — ENOXAPARIN SODIUM 40 MG/0.4ML ~~LOC~~ SOLN
40.0000 mg | SUBCUTANEOUS | Status: DC
  Administered 2018-06-20: 40 mg via SUBCUTANEOUS
  Filled 2018-06-20: qty 0.4

## 2018-06-20 MED ORDER — HYDRALAZINE HCL 20 MG/ML IJ SOLN
5.0000 mg | Freq: Four times a day (QID) | INTRAMUSCULAR | Status: DC | PRN
  Administered 2018-06-20 – 2018-06-21 (×2): 5 mg via INTRAVENOUS
  Filled 2018-06-20 (×3): qty 1

## 2018-06-20 MED ORDER — ASPIRIN EC 81 MG PO TBEC
81.0000 mg | DELAYED_RELEASE_TABLET | Freq: Every day | ORAL | Status: DC
  Administered 2018-06-21 – 2018-06-22 (×2): 81 mg via ORAL
  Filled 2018-06-20 (×3): qty 1

## 2018-06-20 MED ORDER — METOPROLOL TARTRATE 25 MG PO TABS
25.0000 mg | ORAL_TABLET | Freq: Every day | ORAL | Status: DC
  Administered 2018-06-21 – 2018-06-22 (×2): 25 mg via ORAL
  Filled 2018-06-20 (×2): qty 1

## 2018-06-20 MED ORDER — ACETAMINOPHEN 325 MG PO TABS: 650.0000 mg | ORAL_TABLET | Freq: Four times a day (QID) | ORAL | Status: DC | PRN

## 2018-06-20 MED ORDER — PROCHLORPERAZINE EDISYLATE 10 MG/2ML IJ SOLN
5.0000 mg | Freq: Once | INTRAMUSCULAR | Status: AC
  Administered 2018-06-20: 22:00:00 5 mg via INTRAVENOUS
  Filled 2018-06-20: qty 2

## 2018-06-20 MED ORDER — METOPROLOL TARTRATE 25 MG PO TABS
12.5000 mg | ORAL_TABLET | Freq: Once | ORAL | Status: AC
  Administered 2018-06-20: 12.5 mg via ORAL
  Filled 2018-06-20: qty 0.5
  Filled 2018-06-20: qty 1

## 2018-06-20 MED ORDER — ONDANSETRON HCL 4 MG PO TABS: 4.0000 mg | ORAL_TABLET | Freq: Four times a day (QID) | ORAL | Status: DC | PRN

## 2018-06-20 MED ORDER — CALCIUM CARBONATE-VITAMIN D 500-200 MG-UNIT PO TABS
1.0000 | ORAL_TABLET | Freq: Every day | ORAL | Status: DC
  Administered 2018-06-21 – 2018-06-22 (×2): 1 via ORAL
  Filled 2018-06-20 (×3): qty 1

## 2018-06-20 MED ORDER — HYDROCHLOROTHIAZIDE 25 MG PO TABS
25.0000 mg | ORAL_TABLET | Freq: Once | ORAL | Status: DC
  Filled 2018-06-20: qty 1

## 2018-06-20 NOTE — H&P (Addendum)
History and Physical    [name] CNO:[id number] DOB: December 18, 1933 DOA: 06/20/2018  PCP: Glean Hess, MD Patient coming from: Home  Chief Complaint: Nausea/vomiting  HPI: [name] is a 85 y.o. female with medical history significant of bronchiectasis, essential hypertension, hyperlipidemia, psoriasis, right breast cancer, recent COVID-19 diagnosis.  Patient presented secondary to intractable nausea and vomiting for the last day.  She reports it has worsened since yesterday and she is now dry heaving.  She has associated diarrhea with watery stools yesterday which have resolved today.  She is not been able to keep anything down.  She has not taken anything to help with her symptoms but what she received in the emergency department did not help too much.  She continues to feel weak.  She reports no chest pain, cough, shortness of breath, abdominal pain.  Her husband is at home who was also diagnosed with COVID-19.  She has been prescribed azithromycin and Plaquenil has been taking them for the past week.  She is on methotrexate for her psoriasis.   Review of Systems: Review of Systems  Constitutional: Negative for chills and fever.  Respiratory: Negative for cough, sputum production, shortness of breath and wheezing.   Cardiovascular: Negative for chest pain.  Gastrointestinal: Positive for diarrhea, nausea and vomiting. Negative for abdominal pain.  All other systems reviewed and are negative.   Past Medical History:  Diagnosis Date  . Cancer [facility])    left breast cancer     Past Surgical History:  Procedure Laterality Date  . BREAST LUMPECTOMY Left 2008  . CHOLECYSTECTOMY    . PARTIAL HYSTERECTOMY       reports that she has never smoked. She has never used smokeless tobacco. She reports that she does not drink alcohol or use drugs.  No Known Allergies  Family History  Problem Relation Age of Onset  . Breast cancer Sister    Prior to Admission medications    Medication Sig Start Date End Date Taking? Authorizing Provider  acidophilus (RISAQUAD) CAPS capsule Take 1 capsule by mouth daily.   Yes [provider]  Ascorbic Acid (VITAMIN C) 100 MG tablet Take 100 mg by mouth daily.   Yes [provider]  aspirin EC 81 MG tablet Take 81 mg by mouth daily.   Yes [provider]  atorvastatin (LIPITOR) 10 MG tablet TAKE 1 TABLET(10 MG) BY MOUTH DAILY Patient taking differently: Take 10 mg by mouth daily.  03/11/18  Yes Glean Hess, MD  azithromycin (ZITHROMAX) 250 MG tablet Take 250 mg by mouth daily. 06/14/18 06/21/18 Yes [provider]  calcium-vitamin D (CALCIUM 500/D) 500-200 MG-UNIT tablet Take 1 tablet by mouth daily.   Yes [provider]  cetirizine (ZYRTEC) 10 MG tablet Take 10 mg by mouth daily.   Yes [provider]  fluticasone (FLONASE) 50 MCG/ACT nasal spray SHAKE LIQUID AND USE 2 SPRAYS IN EACH NOSTRIL DAILY Patient taking differently: Place 2 sprays into both nostrils daily as needed for allergies or rhinitis.  03/11/18  Yes Glean Hess, MD  folic acid (FOLVITE) 1 MG tablet Take 1 mg by mouth daily as needed (for supplement).    Yes [provider]  hydrochlorothiazide (MICROZIDE) 12.5 MG capsule Take 1 capsule (12.5 mg total) by mouth daily. 01/04/18  Yes Glean Hess, MD  hydroxychloroquine (PLAQUENIL) 200 MG tablet Take 200 mg by mouth 2 (two) times daily. 06/14/18 06/24/18 Yes [provider]  methotrexate (RHEUMATREX) 2.5 MG  tablet Take 5 mg by mouth once a week.    Yes [provider]  metoprolol tartrate (LOPRESSOR) 25 MG tablet TAKE 1/2 TABLET BY MOUTH ONCE DAILY Patient taking differently: Take 25 mg by mouth daily.  04/25/18  Yes Glean Hess, MD  Multiple Vitamins-Minerals (MULTIVITAMIN ADULT PO) Take 1 tablet by mouth daily.    Yes [provider]  vitamin E 100 UNIT capsule Take 100 Units by mouth daily.   Yes [provider]    Physical Exam:  Physical Exam Vitals signs and nursing note reviewed.  Constitutional:      General: She is not in acute distress.    Appearance: She is well-developed. She is not diaphoretic.  Eyes:     Conjunctiva/sclera: Conjunctivae normal.     Pupils: Pupils are equal, round, and reactive to light.  Neck:     Musculoskeletal: Normal range of motion.  Cardiovascular:     Rate and Rhythm: Normal rate and regular rhythm.     Heart sounds: Normal heart sounds. No murmur.  Pulmonary:     Effort: Pulmonary effort is normal. No respiratory distress.     Breath sounds: Normal breath sounds. No wheezing or rales.  Abdominal:     General: Bowel sounds are normal. There is no distension.     Palpations: Abdomen is soft.     Tenderness: There is no abdominal tenderness. There is no guarding or rebound.  Musculoskeletal: Normal range of motion.        General: No tenderness.  Lymphadenopathy:     Cervical: No cervical adenopathy.  Skin:    General: Skin is warm and dry.  Neurological:     Mental Status: She is alert and oriented to person, place, and time.     Labs on Admission: I have personally reviewed following labs and imaging studies  CBC: Recent Labs  Lab 06/20/18 0919  WBC 6.3  NEUTROABS 4.8  HGB 14.7  HCT 43.6  MCV 90.1  PLT 536    Basic Metabolic Panel: Recent Labs  Lab 06/20/18 0919  NA 139  K 3.7  CL 105  CO2 22  GLUCOSE 135*  BUN 15  CREATININE 0.62  CALCIUM 8.4*    GFR: Estimated Creatinine Clearance: 44.8 mL/min (by C-G formula based on SCr of 0.62 mg/dL).  Liver Function Tests: Recent Labs  Lab 06/20/18 0919  AST 139*  ALT 89*  ALKPHOS 130*  BILITOT 0.6  PROT 7.4  ALBUMIN 3.9   No results for input(s): LIPASE, AMYLASE in the last 168 hours. No results for input(s): AMMONIA in the last 168 hours.  Coagulation Profile: No results for input(s): INR, PROTIME in the last 168 hours.  Cardiac Enzymes: Recent Labs   Lab 06/20/18 0919 06/20/18 1147  TROPONINI 0.03* 0.09*   Urine analysis:    Component Value Date/Time   BILIRUBINUR neg 01/04/2018 1111   PROTEINUR Negative 01/04/2018 1111   UROBILINOGEN 0.2 01/04/2018 1111   NITRITE neg 01/04/2018 1111   LEUKOCYTESUR Small (1+) (A) 01/04/2018 1111     Radiological Exams on Admission: Dg Chest Portable 1 View  Result Date: 06/20/2018 CLINICAL DATA:  Nausea, vomiting and weakness. Reported recent outside diagnosis of COVID-19 infection. EXAM: PORTABLE CHEST 1 VIEW COMPARISON:  CT 02/07/2017.  No prior radiographs available. FINDINGS: 0849 hours. The heart size and mediastinal contours are stable with aortic atherosclerosis. There is stable chronic lung disease with interstitial thickening, bronchiectasis and biapical pleural calcifications. No superimposed airspace or ground-glass  opacities identified. There is no pleural effusion or pneumothorax. The bones appear unremarkable. IMPRESSION: Stable chronic lung disease without radiographic evidence of acute superimposed process. Electronically Signed   By: Richardean Sale M.D.   On: 06/20/2018 09:39    EKG: Independently reviewed. Normal sinus. Normal QTc  Assessment/Plan Principal Problem:   Intractable nausea and vomiting Active Problems:   Environmental and seasonal allergies   Psoriasis vulgaris   Essential (primary) hypertension   Hyperlipidemia, mixed   Bronchiectasis (HCC)  Intractable nausea/vomiting Diarrhea In setting of SARS-Cov-19 infection. On Plaquenil as an outpatient. Still with nausea but diarrhea resolved today. Received IV fluids in the ED per report. Mildly elevated alkphos, AST, ALT with no RUQ tenderness. -NS IV fluids overnight -Ice chips, advance as tolerated -Zofran prn, normal QTc -CMP in AM; since no abdominal tenderness, will hold off on RUQ ultrasound and follow labs  COVID-19 infection Patient was being treated outpatient with azithromycin and Plaquenil.  -Discontinue secondary to completed course -Contact/droplet precautions -Will obtain D-dimer, CRP, IL6, triglycerides, procal, ferritin, fibrinogen, sed rate, LDH, troponin  Psoriasis On methotrexate qweekly as an outpatient  Essential hypertension -Continue home metoprolol -Hold hydrochlorothiazide in setting of dehydration  Bronchiectasis Stable. Follows pulmonology as an outpatient. On room air at baseline  Allergies -Continue  Hyperlipidemia -Continue lipitor   Elevated troponin Likely demand ischemia with normal EKG and no chest pain. Troponin trending up but low. -Repeat troponin in AM -AM EKG   DVT prophylaxis: Lovenox Code Status: DNR Family Communication: None at bedside Disposition Plan: Discharge in 24 hours if nausea/vomiting improved Consults called: None Admission status: Observation   Cordelia Poche, MD [facility] 06/20/2018, 5:19 PM  If 7PM-7AM, please contact night-coverage [email or web address] Password [email or web address]

## 2018-06-20 NOTE — ED Notes (Signed)
Date and time results received: 06/20/18 9:59 AM    Test: Troponin Critical Value: 0.03  Name of Provider Notified: Alfred Levins  Orders Received? Or Actions Taken?: Orders Received - See Orders for details

## 2018-06-20 NOTE — ED Notes (Signed)
Pt was adjusted in bed and provided warm blankets

## 2018-06-20 NOTE — ED Triage Notes (Signed)
Pt to ER from home with c/o n/v and weakness.   Pt was diagnosed with Covid 19 last week at [facility].  Pt with dry heaves during assessment

## 2018-06-20 NOTE — ED Provider Notes (Signed)
[facility] Emergency Department Provider Note  ____________________________________________  Time seen: Approximately 9:01 AM  I have reviewed the triage vital signs and the nursing notes.   HISTORY  Chief Complaint Nausea; Emesis; and Weakness   HPI [name] is a 85 y.o. female with a history of hypertension, hyperlipidemia, bronchiectasis, remote breast cancer who presents for evaluation of nausea, vomiting, diarrhea.  Patient has had flulike symptoms for a week.  Saw her pulmonologist a week ago and was diagnosed with Covid 19.  Over the last 3 days patient has had severe constant nausea, she vomits every time she tries to eat or drink anything.  Has had several daily episodes of diarrhea.  She feels extremely weak.  She denies abdominal pain, chest pain, shortness of breath, cough.  She has had low-grade fevers in the afternoon for about a week.  Last Tylenol was yesterday evening.   Patient Active Problem List   Diagnosis Date Noted  . BCC (basal cell carcinoma), face 10/12/2017  . Environmental and seasonal allergies 10/30/2014  . Psoriasis vulgaris 10/30/2014  . Essential (primary) hypertension 10/30/2014  . Gastro-esophageal reflux disease without esophagitis 10/30/2014  . Hot flash, menopausal 10/30/2014  . Hyperlipidemia, mixed 10/30/2014  . History of cancer of right breast 10/30/2014  . Bronchiectasis ([place]) 09/24/2013    Past Surgical History:  Procedure Laterality Date  . BREAST LUMPECTOMY Left 2008  . CHOLECYSTECTOMY    . PARTIAL HYSTERECTOMY      Prior to Admission medications   Medication Sig Start Date End Date Taking? Authorizing Provider  albuterol (PROAIR HFA) 108 (90 Base) MCG/ACT inhaler Inhale into the lungs every 6 (six) hours as needed for wheezing or shortness of breath.    [provider]  Ascorbic Acid (VITAMIN C) 100 MG tablet Take 100 mg by mouth daily.    [provider]  aspirin 81 MG chewable  tablet Chew 1 tablet by mouth daily. Taking 1/4 tab    [provider]  atorvastatin (LIPITOR) 10 MG tablet TAKE 1 TABLET(10 MG) BY MOUTH DAILY 03/11/18   Glean Hess, MD  Calcium Carb-Cholecalciferol (CALCIUM 600+D3 PO) Take by mouth.    [provider]  Cyanocobalamin (VITAMIN B 12 PO) Take 1,000 mcg by mouth.    [provider]  fluticasone (FLONASE) 50 MCG/ACT nasal spray SHAKE LIQUID AND USE 2 SPRAYS IN EACH NOSTRIL DAILY 03/11/18   Glean Hess, MD  folic acid (FOLVITE) 1 MG tablet Take 1 tablet by mouth daily. 08/27/14   [provider]  hydrochlorothiazide (MICROZIDE) 12.5 MG capsule Take 1 capsule (12.5 mg total) by mouth daily. 01/04/18   Glean Hess, MD  methotrexate (RHEUMATREX) 2.5 MG tablet Take 4 tablets by mouth once a week. 10/20/14   [provider]  metoprolol tartrate (LOPRESSOR) 25 MG tablet TAKE 1/2 TABLET BY MOUTH ONCE DAILY 04/25/18   Glean Hess, MD  Multiple Vitamins-Minerals (MULTIVITAMIN ADULT PO) Take by mouth.    [provider]  Omega-3 Fatty Acids (FP FISH OIL PO) Take by mouth.    [provider]  Probiotic Product (PROBIOTIC ADVANCED PO) Take by mouth.    [provider]    Allergies Patient has no known allergies.  Family History  Problem Relation Age of Onset  . Breast cancer Sister     Social History Social History   Tobacco Use  . Smoking status: Never Smoker  . Smokeless tobacco: Never Used  Substance Use Topics  .  Alcohol use: No    Alcohol/week: 2.0 standard drinks    Types: 2 Standard drinks or equivalent per week  . Drug use: No    Review of Systems  Constitutional: + fever, generalized weakness Eyes: Negative for visual changes. ENT: Negative for sore throat. Neck: No neck pain  Cardiovascular: Negative for chest pain. Respiratory: Negative for shortness of breath. Gastrointestinal: Negative for abdominal pain. + vomiting and diarrhea.  Genitourinary: Negative for dysuria. Musculoskeletal: Negative for back pain. Skin: Negative for rash. Neurological: Negative for headaches, weakness or numbness. Psych: No SI or HI  ____________________________________________   PHYSICAL EXAM:  VITAL SIGNS: ED Triage Vitals  Enc Vitals Group     BP 06/20/18 0854 (!) 216/86     Pulse Rate 06/20/18 0854 81     Resp 06/20/18 0854 18     Temp 06/20/18 0854 98.1 F (36.7 C)     Temp src --      SpO2 06/20/18 0854 94 %     Weight 06/20/18 0857 123 lb (55.8 kg)     Height 06/20/18 0857 5\' 3"  (1.6 m)     Head Circumference --      Peak Flow --      Pain Score 06/20/18 0855 0     Pain Loc --      Pain Edu? --      Excl. in [place]? --     Constitutional: Alert and oriented. Well appearing and in no apparent distress. HEENT:      Head: Normocephalic and atraumatic.         Eyes: Conjunctivae are normal. Sclera is non-icteric.       Mouth/Throat: Mucous membranes are moist.       Neck: Supple with no signs of meningismus. Cardiovascular: Regular rate and rhythm. No murmurs, gallops, or rubs. 2+ symmetrical distal pulses are present in all extremities. No JVD. Respiratory: Normal respiratory effort. Lungs are clear to auscultation bilaterally. No wheezes, crackles, or rhonchi.  Gastrointestinal: Soft, non tender, and non distended with positive bowel sounds. No rebound or guarding. Musculoskeletal: Nontender with normal range of motion in all extremities. No edema, cyanosis, or erythema of extremities. Neurologic: Normal speech and language. Face is symmetric. Moving all extremities. No gross focal neurologic deficits are appreciated. Skin: Skin is warm, dry and intact. No rash noted. Psychiatric: Mood and affect are normal. Speech and behavior are normal.  ____________________________________________   LABS (all labs ordered are listed, but only abnormal results are displayed)  Labs Reviewed  COMPREHENSIVE METABOLIC PANEL -  Abnormal; Notable for the following components:      Result Value   Glucose, Bld 135 (*)    Calcium 8.4 (*)    AST 139 (*)    ALT 89 (*)    Alkaline Phosphatase 130 (*)    All other components within normal limits  TROPONIN I - Abnormal; Notable for the following components:   Troponin I 0.03 (*)    All other components within normal limits  TROPONIN I - Abnormal; Notable for the following components:   Troponin I 0.09 (*)    All other components within normal limits  CBC WITH DIFFERENTIAL/PLATELET  LACTIC ACID, PLASMA   ____________________________________________  EKG  ED ECG REPORT I, Rudene Re, the attending physician, personally viewed and interpreted this ECG.  Normal sinus rhythm, rate of 76, normal intervals, normal axis, no ST elevations or depressions.  Normal EKG. ____________________________________________  RADIOLOGY  I have personally reviewed the images performed during  this visit and I agree with the Radiologist's read.   Interpretation by Radiologist:  Dg Chest Portable 1 View  Result Date: 06/20/2018 CLINICAL DATA:  Nausea, vomiting and weakness. Reported recent outside diagnosis of COVID-19 infection. EXAM: PORTABLE CHEST 1 VIEW COMPARISON:  CT 02/07/2017.  No prior radiographs available. FINDINGS: 0849 hours. The heart size and mediastinal contours are stable with aortic atherosclerosis. There is stable chronic lung disease with interstitial thickening, bronchiectasis and biapical pleural calcifications. No superimposed airspace or ground-glass opacities identified. There is no pleural effusion or pneumothorax. The bones appear unremarkable. IMPRESSION: Stable chronic lung disease without radiographic evidence of acute superimposed process. Electronically Signed   By: Richardean Sale M.D.   On: 06/20/2018 09:39      ____________________________________________   PROCEDURES  Procedure(s) performed: None Procedures Critical Care performed: yes   CRITICAL CARE Performed by: Rudene Re  ?  Total critical care time: 35 min  Critical care time was exclusive of separately billable procedures and treating other patients.  Critical care was necessary to treat or prevent imminent or life-threatening deterioration.  Critical care was time spent personally by me on the following activities: development of treatment plan with patient and/or surrogate as well as nursing, discussions with consultants, evaluation of patient's response to treatment, examination of patient, obtaining history from patient or surrogate, ordering and performing treatments and interventions, ordering and review of laboratory studies, ordering and review of radiographic studies, pulse oximetry and re-evaluation of patient's condition.  ____________________________________________   INITIAL IMPRESSION / ASSESSMENT AND PLAN / ED COURSE  85 y.o. female with a history of + Covid 19 test one week ago, hypertension, hyperlipidemia, bronchiectasis, remote breast cancer who presents for evaluation of nausea, vomiting, diarrhea and generalized weakness for 3 days. Patient looks dry on exam but in no distress. Vitals showing the patient is hypertensive in the setting of not being able to keep down her home meds, she is afebrile, normal work of breathing, normal sats, no tachycardia.  Abdomen is soft with no tenderness. Symptoms most likely due to Covid infection. No signs of respiratory distress. Will give IVF and zofran. Will check labs, EKG, CXR. Will give home antihypertensives and reassess. Patient is currently quarantining herself at home since the onset of illness.    _________________________ 1:08 PM on 06/20/2018 ----------------------------------------- \Chest x-ray with no evidence of pneumonia.  First troponin was borderline at 0.03.  Repeat troponin has gone up consistent with demand ischemia from Covid infection.  Patient remains nauseated even after 2 rounds  of IV antiemetics.  Patient will be admitted for further care.   As part of my medical decision making, I reviewed the following data within the [place] notes reviewed and incorporated, Labs reviewed , EKG interpreted , Old EKG reviewed, Old chart reviewed, Radiograph reviewed , Discussed with admitting physician , Notes from prior ED visits and [place] Controlled Substance Database    Pertinent labs & imaging results that were available during my care of the patient were reviewed by me and considered in my medical decision making (see chart for details).    ____________________________________________   FINAL CLINICAL IMPRESSION(S) / ED DIAGNOSES  Final diagnoses:  Nausea vomiting and diarrhea  Demand ischemia of myocardium ([place])  COVID-19 virus infection      NEW MEDICATIONS STARTED DURING THIS VISIT:  ED Discharge Orders    None       Note:  This document was prepared using Dragon voice recognition software and may include  unintentional dictation errors.    Alfred Levins, [place], MD 06/20/18 1308

## 2018-06-20 NOTE — ED Notes (Signed)
EMTALA reviewed by this RN.  

## 2018-06-20 NOTE — ED Notes (Signed)
This RN contacted pt's husband and updated him to pt's disposition. Questions were answered and he was reassured about pt's condition.

## 2018-06-20 NOTE — ED Notes (Addendum)
ED TO INPATIENT HANDOFF REPORT  ED Nurse Name and Phone #: [place] Name/Age/Gender [name] 85 y.o. female Room/Bed: ED34A/ED34A  Code Status   Code Status: Not on file  Home/SNF/Other Home Patient oriented to: self, place, time and situation Is this baseline? Yes   Triage Complete: Triage complete  Chief Complaint fever, nausea  Triage Note Pt to ER from home with c/o n/v and weakness.   Pt was diagnosed with Covid 19 last week at [facility].  Pt with dry heaves during assessment   Allergies No Known Allergies  Level of Care/Admitting Diagnosis ED Disposition    ED Disposition Condition Comment   Admit  The patient appears reasonably stabilized for admission considering the current resources, flow, and capabilities available in the ED at this time, and I doubt any other [facility] requiring further screening and/or treatment in the ED prior to admission is  present.       B Medical/Surgery History History reviewed. No pertinent past medical history. Past Surgical History:  Procedure Laterality Date  . BREAST LUMPECTOMY Left 2008  . CHOLECYSTECTOMY    . PARTIAL HYSTERECTOMY       A IV Location/Drains/Wounds Patient Lines/Drains/Airways Status   Active Line/Drains/Airways    Name:   Placement date:   Placement time:   Site:   Days:   Peripheral IV 06/20/18 Right Hand   06/20/18    0918    Hand   less than 1   External Urinary Catheter   06/20/18    1112    -   less than 1          Intake/Output Last 24 hours No intake or output data in the 24 hours ending 06/20/18 1305  Labs/Imaging Results for orders placed or performed during the hospital encounter of 06/20/18 (from the past 48 hour(s))  CBC with Differential/Platelet     Status: None   Collection Time: 06/20/18  9:19 AM  Result Value Ref Range   WBC 6.3 4.0 - 10.5 K/uL   RBC 4.84 3.87 - 5.11 MIL/uL   Hemoglobin 14.7 12.0 - 15.0 g/dL   HCT 43.6 36.0 - 46.0 %   MCV 90.1 80.0 - 100.0 fL   MCH 30.4 26.0 - 34.0 pg   MCHC 33.7 30.0 - 36.0 g/dL   RDW 12.7 11.5 - 15.5 %   Platelets 158 150 - 400 K/uL   nRBC 0.0 0.0 - 0.2 %   Neutrophils Relative % 76 %   Neutro Abs 4.8 1.7 - 7.7 K/uL   Lymphocytes Relative 17 %   Lymphs Abs 1.1 0.7 - 4.0 K/uL   Monocytes Relative 7 %   Monocytes Absolute 0.4 0.1 - 1.0 K/uL   Eosinophils Relative 0 %   Eosinophils Absolute 0.0 0.0 - 0.5 K/uL   Basophils Relative 0 %   Basophils Absolute 0.0 0.0 - 0.1 K/uL   Immature Granulocytes 0 %   Abs Immature Granulocytes 0.01 0.00 - 0.07 K/uL    Comment: Performed at [facility], [address] [place], [place] [address]  Comprehensive metabolic panel     Status: Abnormal   Collection Time: 06/20/18  9:19 AM  Result Value Ref Range   Sodium 139 135 - 145 mmol/L   Potassium 3.7 3.5 - 5.1 mmol/L   Chloride 105 98 - 111 mmol/L   CO2 22 22 - 32 mmol/L   Glucose, Bld 135 (H) 70 - 99 mg/dL   BUN 15 8 -  23 mg/dL   Creatinine, Ser 0.62 0.44 - 1.00 mg/dL   Calcium 8.4 (L) 8.9 - 10.3 mg/dL   Total Protein 7.4 6.5 - 8.1 g/dL   Albumin 3.9 3.5 - 5.0 g/dL   AST 139 (H) 15 - 41 U/L   ALT 89 (H) 0 - 44 U/L   Alkaline Phosphatase 130 (H) 38 - 126 U/L   Total Bilirubin 0.6 0.3 - 1.2 mg/dL   GFR calc non Af Amer >60 >60 mL/min   GFR calc Af Amer >60 >60 mL/min   Anion gap 12 5 - 15    Comment: Performed at [facility], [address] [place], [place] [address]  Lactic acid, plasma     Status: None   Collection Time: 06/20/18  9:19 AM  Result Value Ref Range   Lactic Acid, Venous 1.5 0.5 - 1.9 mmol/L    Comment: Performed at [facility], [address] [place], [place] [address]  Troponin I - ONCE - STAT     Status: Abnormal   Collection Time: 06/20/18  9:19 AM  Result Value Ref Range   Troponin I 0.03 (HH) <0.03 ng/mL    Comment: CRITICAL RESULT CALLED TO, READ BACK BY AND VERIFIED WITH CALLED TO JENIFFER WHITLY @0950  06/20/18 MU/JJB Performed at [facility], [place], [place] [address]   Troponin I - Once-Timed     Status: Abnormal   Collection Time: 06/20/18 11:47 AM  Result Value Ref Range   Troponin I 0.09 (HH) <0.03 ng/mL    Comment: CRITICAL VALUE NOTED. VALUE IS CONSISTENT WITH PREVIOUSLY REPORTED/CALLED VALUE/HKP Performed at [facility], [address] [place], [place] [address]    Dg Chest Portable 1 View  Result Date: 06/20/2018 CLINICAL DATA:  Nausea, vomiting and weakness. Reported recent outside diagnosis of COVID-19 infection. EXAM: PORTABLE CHEST 1 VIEW COMPARISON:  CT 02/07/2017.  No prior radiographs available. FINDINGS: 0849 hours. The heart size and mediastinal contours are stable with aortic atherosclerosis. There is stable chronic lung disease with interstitial thickening, bronchiectasis and biapical pleural calcifications. No superimposed airspace or ground-glass opacities identified. There is no pleural effusion or pneumothorax. The bones appear unremarkable. IMPRESSION: Stable chronic lung disease without radiographic evidence of acute superimposed process. Electronically Signed   By: Richardean Sale M.D.   On: 06/20/2018 09:39    Pending Labs Unresulted Labs (From admission, onward)   None      Vitals/Pain Today's Vitals   06/20/18 0930 06/20/18 1059 06/20/18 1130 06/20/18 1240  BP: (!) 167/59 (!) 153/63 (!) 152/73 (!) 178/80  Pulse: 71 89 84 99  Resp: 18 18  18   Temp:    98.2 F (36.8 C)  TempSrc:    Oral  SpO2: 94% 95% 93% 94%  Weight:      Height:      PainSc:        Isolation Precautions No active isolations  Medications Medications  hydrochlorothiazide (MICROZIDE) capsule 12.5 mg (0 mg Oral Hold 06/20/18 1146)  metoprolol tartrate (LOPRESSOR) tablet 12.5 mg (12.5 mg Oral Given 06/20/18 1235)  sodium chloride 0.9 % bolus 1,000 mL (0 mLs Intravenous Stopped 06/20/18 1112)  ondansetron (ZOFRAN) injection 4 mg (4 mg Intravenous Given 06/20/18 0929)  metoCLOPramide  (REGLAN) injection 10 mg (10 mg Intravenous Given 06/20/18 1058)  sodium chloride 0.9 % bolus 1,000 mL (1,000 mLs Intravenous New Bag/Given 06/20/18 1156)    Mobility walks with person assist Low fall risk   Focused  Assessments    R Recommendations: See Admitting Provider Note  Report given to: Sheppard Evens, RN

## 2018-06-20 NOTE — ED Notes (Signed)
MD informed pt that she will be transferred

## 2018-06-20 NOTE — Progress Notes (Signed)
CRITICAL VALUE ALERT  Critical Value:  Troponin 0.14  Date & Time Notied:  06/20/18 1920  Provider Notified: Schorr NP   Orders Received/Actions taken: pending

## 2018-06-20 NOTE — ED Notes (Signed)
First Nurse Note: Patient's family in [place], wearing masks.  Asked to wait in vehicle.  Phone No: [phone].

## 2018-06-21 ENCOUNTER — Encounter: Payer: Self-pay | Admitting: Internal Medicine

## 2018-06-21 DIAGNOSIS — E782 Mixed hyperlipidemia: Secondary | ICD-10-CM | POA: Diagnosis present

## 2018-06-21 DIAGNOSIS — L400 Psoriasis vulgaris: Secondary | ICD-10-CM | POA: Diagnosis present

## 2018-06-21 DIAGNOSIS — J479 Bronchiectasis, uncomplicated: Secondary | ICD-10-CM | POA: Diagnosis present

## 2018-06-21 DIAGNOSIS — R112 Nausea with vomiting, unspecified: Secondary | ICD-10-CM | POA: Diagnosis present

## 2018-06-21 DIAGNOSIS — Z66 Do not resuscitate: Secondary | ICD-10-CM | POA: Diagnosis present

## 2018-06-21 DIAGNOSIS — Z803 Family history of malignant neoplasm of breast: Secondary | ICD-10-CM | POA: Diagnosis not present

## 2018-06-21 DIAGNOSIS — E860 Dehydration: Secondary | ICD-10-CM | POA: Diagnosis present

## 2018-06-21 DIAGNOSIS — J302 Other seasonal allergic rhinitis: Secondary | ICD-10-CM | POA: Diagnosis present

## 2018-06-21 DIAGNOSIS — I10 Essential (primary) hypertension: Secondary | ICD-10-CM | POA: Diagnosis present

## 2018-06-21 DIAGNOSIS — Z7982 Long term (current) use of aspirin: Secondary | ICD-10-CM | POA: Diagnosis not present

## 2018-06-21 DIAGNOSIS — R740 Nonspecific elevation of levels of transaminase and lactic acid dehydrogenase [LDH]: Secondary | ICD-10-CM | POA: Diagnosis present

## 2018-06-21 DIAGNOSIS — Z79899 Other long term (current) drug therapy: Secondary | ICD-10-CM | POA: Diagnosis not present

## 2018-06-21 DIAGNOSIS — E876 Hypokalemia: Secondary | ICD-10-CM | POA: Diagnosis present

## 2018-06-21 DIAGNOSIS — K219 Gastro-esophageal reflux disease without esophagitis: Secondary | ICD-10-CM | POA: Diagnosis present

## 2018-06-21 DIAGNOSIS — R7989 Other specified abnormal findings of blood chemistry: Secondary | ICD-10-CM | POA: Diagnosis present

## 2018-06-21 DIAGNOSIS — R9431 Abnormal electrocardiogram [ECG] [EKG]: Secondary | ICD-10-CM | POA: Diagnosis present

## 2018-06-21 DIAGNOSIS — Z853 Personal history of malignant neoplasm of breast: Secondary | ICD-10-CM | POA: Diagnosis not present

## 2018-06-21 DIAGNOSIS — Z90711 Acquired absence of uterus with remaining cervical stump: Secondary | ICD-10-CM | POA: Diagnosis not present

## 2018-06-21 LAB — CBC WITH DIFFERENTIAL/PLATELET
Abs Immature Granulocytes: 0.04 10*3/uL (ref 0.00–0.07)
Basophils Absolute: 0 10*3/uL (ref 0.0–0.1)
Basophils Relative: 0 %
Eosinophils Absolute: 0 10*3/uL (ref 0.0–0.5)
Eosinophils Relative: 0 %
HCT: 41.7 % (ref 36.0–46.0)
Hemoglobin: 13.7 g/dL (ref 12.0–15.0)
Immature Granulocytes: 0 %
Lymphocytes Relative: 16 %
Lymphs Abs: 1.5 10*3/uL (ref 0.7–4.0)
MCH: 30.2 pg (ref 26.0–34.0)
MCHC: 32.9 g/dL (ref 30.0–36.0)
MCV: 91.9 fL (ref 80.0–100.0)
Monocytes Absolute: 0.9 10*3/uL (ref 0.1–1.0)
Monocytes Relative: 10 %
Neutro Abs: 6.8 10*3/uL (ref 1.7–7.7)
Neutrophils Relative %: 74 %
Platelets: 185 10*3/uL (ref 150–400)
RBC: 4.54 MIL/uL (ref 3.87–5.11)
RDW: 12.8 % (ref 11.5–15.5)
WBC: 9.2 10*3/uL (ref 4.0–10.5)
nRBC: 0 % (ref 0.0–0.2)

## 2018-06-21 LAB — COMPREHENSIVE METABOLIC PANEL
ALT: 57 U/L — ABNORMAL HIGH (ref 0–44)
AST: 54 U/L — ABNORMAL HIGH (ref 15–41)
Albumin: 3.6 g/dL (ref 3.5–5.0)
Alkaline Phosphatase: 109 U/L (ref 38–126)
Anion gap: 12 (ref 5–15)
BUN: 12 mg/dL (ref 8–23)
CO2: 20 mmol/L — ABNORMAL LOW (ref 22–32)
Calcium: 7.8 mg/dL — ABNORMAL LOW (ref 8.9–10.3)
Chloride: 109 mmol/L (ref 98–111)
Creatinine, Ser: 0.62 mg/dL (ref 0.44–1.00)
GFR calc Af Amer: >60 mL/min (ref >60)
GFR calc non Af Amer: >60 mL/min (ref >60)
Glucose, Bld: 93 mg/dL (ref 70–99)
Potassium: 3 mmol/L — ABNORMAL LOW (ref 3.5–5.1)
Sodium: 141 mmol/L (ref 135–145)
Total Bilirubin: 0.7 mg/dL (ref 0.3–1.2)
Total Protein: 6.7 g/dL (ref 6.5–8.1)

## 2018-06-21 LAB — MAGNESIUM: Magnesium: 1.8 mg/dL (ref 1.7–2.4)

## 2018-06-21 LAB — INTERLEUKIN-6, PLASMA: Interleukin-6, Plasma: 4.9 pg/mL (ref 0.0–12.2)

## 2018-06-21 LAB — C-REACTIVE PROTEIN: CRP: 0.7 mg/dL (ref <1.0)

## 2018-06-21 LAB — PHOSPHORUS: Phosphorus: 2.1 mg/dL — ABNORMAL LOW (ref 2.5–4.6)

## 2018-06-21 LAB — TRIGLYCERIDES: Triglycerides: 104 mg/dL (ref <150)

## 2018-06-21 LAB — TROPONIN I: Troponin I: 0.14 ng/mL (ref <0.03)

## 2018-06-21 LAB — FERRITIN: Ferritin: 298 ng/mL (ref 11–307)

## 2018-06-21 LAB — D-DIMER, QUANTITATIVE: D-Dimer, Quant: 0.92 ug/mL-FEU — ABNORMAL HIGH (ref 0.00–0.50)

## 2018-06-21 MED ORDER — ENOXAPARIN SODIUM 30 MG/0.3ML ~~LOC~~ SOLN
30.0000 mg | SUBCUTANEOUS | Status: DC
  Administered 2018-06-21: 20:00:00 30 mg via SUBCUTANEOUS
  Filled 2018-06-21: qty 0.3

## 2018-06-21 MED ORDER — POTASSIUM CHLORIDE CRYS ER 20 MEQ PO TBCR
40.0000 meq | EXTENDED_RELEASE_TABLET | ORAL | Status: AC
  Administered 2018-06-21 (×2): 40 meq via ORAL
  Filled 2018-06-21 (×2): qty 2

## 2018-06-21 MED ORDER — MAGNESIUM SULFATE 2 GM/50ML IV SOLN
2.0000 g | Freq: Once | INTRAVENOUS | Status: AC
  Administered 2018-06-21: 2 g via INTRAVENOUS
  Filled 2018-06-21: qty 50

## 2018-06-21 MED ORDER — ENOXAPARIN SODIUM 40 MG/0.4ML ~~LOC~~ SOLN
40.0000 mg | SUBCUTANEOUS | Status: DC
  Administered 2018-06-22: 40 mg via SUBCUTANEOUS
  Filled 2018-06-21: qty 0.4

## 2018-06-21 MED ORDER — SODIUM CHLORIDE 0.45 % IV SOLN
INTRAVENOUS | Status: DC
  Administered 2018-06-21: 18:00:00 via INTRAVENOUS
  Filled 2018-06-21 (×3): qty 1000

## 2018-06-21 MED ORDER — METOCLOPRAMIDE HCL 5 MG/ML IJ SOLN
5.0000 mg | Freq: Three times a day (TID) | INTRAMUSCULAR | Status: DC | PRN
  Administered 2018-06-21 – 2018-06-22 (×2): 5 mg via INTRAVENOUS
  Filled 2018-06-21 (×2): qty 2

## 2018-06-21 MED ORDER — METOCLOPRAMIDE HCL 5 MG/ML IJ SOLN
5.0000 mg | Freq: Once | INTRAMUSCULAR | Status: AC
  Administered 2018-06-21: 11:00:00 5 mg via INTRAVENOUS
  Filled 2018-06-21: qty 2

## 2018-06-21 MED ORDER — POTASSIUM CHLORIDE 10 MEQ/100ML IV SOLN
10.0000 meq | INTRAVENOUS | Status: AC
  Administered 2018-06-21 (×2): 10 meq via INTRAVENOUS
  Filled 2018-06-21 (×2): qty 100

## 2018-06-21 NOTE — Evaluation (Signed)
Physical Therapy Evaluation Patient Details Name: [name] MRN: [id number] DOB: [birth year] Today's Date: 06/21/2018   History of Present Illness  KANDEE ESCALANTE is a 85 y.o. female with medical history significant of bronchiectasis, essential hypertension, hyperlipidemia, psoriasis, right breast cancer, recent COVID-19 diagnosis.  Patient presented secondary to intractable nausea and vomiting for the last day, or she has been feeling like this for the last 4 to 5 days, with worsening recently,.  Clinical Impression  The patient mobilized to [facility], ambulated with min to min guard in room. Patient did not falter, reports feeling weak. Patient reports spouse at home and can assist. Pt admitted with above diagnosis. Pt currently with functional limitations due to the deficits listed below (see PT Problem List).  Pt will benefit from skilled PT to increase their independence and safety with mobility to allow discharge to the venue listed below.       Follow Up Recommendations  none-    Equipment Recommendations    none   Recommendations for Other Services       Precautions / Restrictions Precautions Precaution Comments: droplet + covid-19      Mobility  Bed Mobility Overal bed mobility: Independent                Transfers Overall transfer level: Needs assistance Equipment used: 1 person hand held assist Transfers: Sit to/from Stand;Stand Pivot Transfers   Stand pivot transfers: Min guard          Ambulation/Gait Ambulation/Gait assistance: Min assist Gait Distance (Feet): 40 Feet Assistive device: 1 person hand held assist Gait Pattern/deviations: Step-through pattern     General Gait Details: ambulated around the room gradually did not require a HHA  Stairs            Wheelchair Mobility    Modified Rankin (Stroke Patients Only)       Balance                                             Pertinent Vitals/Pain Pain Assessment:  No/denies pain    Home Living Family/patient expects to be discharged to:: Private residence Living Arrangements: Spouse/significant other Available Help at Discharge: Family Type of Home: House Home Access: Stairs to enter Entrance Stairs-Rails: Psychiatric nurse of Steps: 4 Home Layout: One level Home Equipment: None      Prior Function Level of Independence: Independent               Hand Dominance        Extremity/Trunk Assessment   Upper Extremity Assessment Upper Extremity Assessment: Generalized weakness    Lower Extremity Assessment Lower Extremity Assessment: Generalized weakness    Cervical / Trunk Assessment Cervical / Trunk Assessment: Kyphotic  Communication   Communication: No difficulties  Cognition Arousal/Alertness: Awake/alert Behavior During Therapy: WFL for tasks assessed/performed Overall Cognitive Status: Within Functional Limits for tasks assessed                                        General Comments      Exercises     Assessment/Plan    PT Assessment Patient needs continued PT services  PT Problem List Decreased activity tolerance;Decreased mobility       PT Treatment Interventions Gait training  PT Goals (Current goals can be found in the Care Plan section)  Acute Rehab PT Goals Patient Stated Goal: feel better PT Goal Formulation: With patient Time For Goal Achievement: 06/28/18 Potential to Achieve Goals: Good    Frequency Min 3X/week   Barriers to discharge        Co-evaluation               AM-PAC PT "6 Clicks" Mobility  Outcome Measure Help needed turning from your back to your side while in a flat bed without using bedrails?: None Help needed moving from lying on your back to sitting on the side of a flat bed without using bedrails?: None Help needed moving to and from a bed to a chair (including a wheelchair)?: A Little Help needed standing up from a chair using  your arms (e.g., wheelchair or bedside chair)?: A Little Help needed to walk in hospital room?: A Little Help needed climbing 3-5 steps with a railing? : A Lot 6 Click Score: 19    End of Session   Activity Tolerance: Patient tolerated treatment well Patient left: in bed;with call bell/phone within reach;with bed alarm set Nurse Communication: Mobility status PT Visit Diagnosis: Unsteadiness on feet (R26.81)    Time: 7416-3845 PT Time Calculation (min) (ACUTE ONLY): 30 min   Charges:   PT Evaluation $PT Eval Low Complexity: 1 Low PT Treatments $Gait Training: 8-22 mins        [place] Pager [phone] Office [phone]   Claretha Cooper 06/21/2018, 3:20 PM

## 2018-06-21 NOTE — Progress Notes (Addendum)
PROGRESS NOTE                                                                                                                                                                                                             Patient Demographics:    [name], is a 85 y.o. female, DOB - 1933/12/11, LKG:[id number]  Admit date - 06/20/2018   Admitting Physician Costin Karlyne Greenspan, MD  Outpatient Primary MD for the patient is Glean Hess, MD  LOS - 1   No chief complaint on file.      Brief Narrative    [name] is a 85 y.o. female with medical history significant of bronchiectasis, essential hypertension, hyperlipidemia, psoriasis, right breast cancer, recent COVID-19 diagnosis.  Patient presented secondary to intractable nausea and vomiting for the last day, or she has been feeling like this for the last 4 to 5 days, with worsening recently,.    Subjective:    [name] today reports she is feeling awfully weak, feeling well at all, she is still having significant nausea, had some dry heaving this morning, still having very poor oral intake, unable even to tolerate her potassium tablets which were ordered this morning.   Assessment  & Plan :    Principal Problem:   Intractable nausea and vomiting Active Problems:   Environmental and seasonal allergies   Psoriasis vulgaris   Essential (primary) hypertension   Hyperlipidemia, mixed   Bronchiectasis ([place])  Nausea/vomiting/diarrhea -Patient still with significant nausea, but vomiting and diarrhea has improved, still having significant dry heaving, this is most likely due to her COVID-19 infection versus Plaquenil, at this point she remains awfully weak, deconditioned, cannot tolerate any oral intake, I have encouraged her to take Ensure , will continue with IV fluids at 75 cc/h. -Given her prolonged QTC I not to be able to do Phenergan or iron for her nausea, I will do PRN Reglan.  COVID  19 infection -No oxygen requirement, she is on Plaquenil and Theramycin at home, I will hold both currently given her prolonged QTC, and possibly contributing to her symptoms, CRP within normal limit, ferritin is trending down, this is reassuring  Elevated troponins -It is minimally elevated, she denies any chest pain or shortness of breath, EKG nonacute, continue to trend  hypokalemia -Repleted Addendum AT 6PM : I  have discussed with the nurse, patient was unable to tolerate her for potassium secondary to nausea today throughout change her IV fluids to normal saline with 40 of KCl, and was given another  10 MEQ. IV  KCl supplement  Transaminitis -abd exam is benign, trending down  Psoriasis On methotrexate qweekly as an outpatient  Essential hypertension -Continue home metoprolol -Hold hydrochlorothiazide in setting of dehydration  Bronchiectasis Stable. Follows pulmonology as an outpatient. On room air at baseline  Allergies -Continue  Hyperlipidemia -Continue lipitor   Code Status : DNR  Family Communication  : None at bedside  Disposition Plan  : Home  Barriers For Discharge : Remains extremely weak, with poor oral intake, on IV fluids,  Consults  :  None  Procedures  : None  DVT Prophylaxis  :  Ross lovenox  Lab Results  Component Value Date   PLT 185 06/21/2018    Antibiotics  :   Anti-infectives (From admission, onward)   None        Objective:   Vitals:   06/20/18 2125 06/21/18 0104 06/21/18 0533 06/21/18 1233  BP: (!) 166/69 (!) 135/53 (!) 167/72 (!) 167/77  Pulse: 86 77 85 98  Resp: 16 18 16  (!) 24  Temp: 97.8 F (36.6 C) 97.9 F (36.6 C) 97.7 F (36.5 C) 98.2 F (36.8 C)  TempSrc: Oral Oral Oral Oral  SpO2: 94% 95% 93% 95%  Weight:      Height:        Wt Readings from Last 3 Encounters:  06/20/18 55.8 kg  06/20/18 55.8 kg  01/04/18 58.1 kg     Intake/Output Summary (Last 24 hours) at 06/21/2018 1416 Last data filed at  06/21/2018 0600 Gross per 24 hour  Intake 1171.25 ml  Output 550 ml  Net 621.25 ml     Physical Exam  Awake Alert, Oriented X 3, extremely frail and deconditioned female, laying in bed and fetal positioning Symmetrical Chest wall movement, Good air movement bilaterally, CTAB RRR,No Gallops,Rubs or new Murmurs, No Parasternal Heave +ve B.Sounds, Abd Soft, No tenderness, No rebound - guarding or rigidity. No Cyanosis, Clubbing or edema, No new Rash or bruise      Data Review:    CBC Recent Labs  Lab 06/20/18 0919 06/21/18 0435  WBC 6.3 9.2  HGB 14.7 13.7  HCT 43.6 41.7  PLT 158 185  MCV 90.1 91.9  MCH 30.4 30.2  MCHC 33.7 32.9  RDW 12.7 12.8  LYMPHSABS 1.1 1.5  MONOABS 0.4 0.9  EOSABS 0.0 0.0  BASOSABS 0.0 0.0    Chemistries  Recent Labs  Lab 06/20/18 0919 06/21/18 0435  NA 139 141  K 3.7 3.0*  CL 105 109  CO2 22 20*  GLUCOSE 135* 93  BUN 15 12  CREATININE 0.62 0.62  CALCIUM 8.4* 7.8*  MG  --  1.8  AST 139* 54*  ALT 89* 57*  ALKPHOS 130* 109  BILITOT 0.6 0.7   ------------------------------------------------------------------------------------------------------------------ Recent Labs    06/20/18 1722 06/21/18 0435  TRIG 88 104    No results found for: HGBA1C ------------------------------------------------------------------------------------------------------------------ No results for input(s): TSH, T4TOTAL, T3FREE, THYROIDAB in the last 72 hours.  Invalid input(s): FREET3 ------------------------------------------------------------------------------------------------------------------ Recent Labs    06/20/18 1722 06/21/18 0435  FERRITIN 340* 298    Coagulation profile No results for input(s): INR, PROTIME in the last 168 hours.  Recent Labs    06/20/18 1722 06/21/18 0435  DDIMER 1.27* 0.92*    Cardiac Enzymes Recent Labs  Lab 06/20/18 1147 06/20/18 1722 06/21/18 0435  TROPONINI 0.09* 0.14* 0.14*    ------------------------------------------------------------------------------------------------------------------    Component Value Date/Time   BNP 534 (H) 01/01/2013 8469    Inpatient Medications  Scheduled Meds: . aspirin EC  81 mg Oral Daily  . atorvastatin  10 mg Oral Q24H  . calcium-vitamin D  1 tablet Oral Daily  . loratadine  10 mg Oral Daily  . metoprolol tartrate  25 mg Oral Daily  . potassium chloride  40 mEq Oral Q4H  . vitamin C  125 mg Oral Daily   Continuous Infusions: . sodium chloride 75 mL/hr at 06/20/18 1735   PRN Meds:.acetaminophen, fluticasone, hydrALAZINE  Micro Results No results found for this or any previous visit (from the past 240 hour(s)).  Radiology Reports Dg Chest Portable 1 View  Result Date: 06/20/2018 CLINICAL DATA:  Nausea, vomiting and weakness. Reported recent outside diagnosis of COVID-19 infection. EXAM: PORTABLE CHEST 1 VIEW COMPARISON:  CT 02/07/2017.  No prior radiographs available. FINDINGS: 0849 hours. The heart size and mediastinal contours are stable with aortic atherosclerosis. There is stable chronic lung disease with interstitial thickening, bronchiectasis and biapical pleural calcifications. No superimposed airspace or ground-glass opacities identified. There is no pleural effusion or pneumothorax. The bones appear unremarkable. IMPRESSION: Stable chronic lung disease without radiographic evidence of acute superimposed process. Electronically Signed   By: Richardean Sale M.D.   On: 06/20/2018 09:39     Phillips Climes M.D on 06/21/2018 at 2:16 PM  Between 7am to 7pm - Pager - [phone]  After 7pm go to [email or web address] - password [facility]  [facility] -  Office  [phone]

## 2018-06-22 ENCOUNTER — Encounter: Payer: Self-pay | Admitting: Internal Medicine

## 2018-06-22 ENCOUNTER — Other Ambulatory Visit: Payer: Self-pay

## 2018-06-22 LAB — BASIC METABOLIC PANEL
Anion gap: 8 (ref 5–15)
BUN: 14 mg/dL (ref 8–23)
CO2: 21 mmol/L — ABNORMAL LOW (ref 22–32)
Calcium: 8.3 mg/dL — ABNORMAL LOW (ref 8.9–10.3)
Chloride: 108 mmol/L (ref 98–111)
Creatinine, Ser: 0.54 mg/dL (ref 0.44–1.00)
GFR calc Af Amer: >60 mL/min (ref >60)
GFR calc non Af Amer: >60 mL/min (ref >60)
Glucose, Bld: 105 mg/dL — ABNORMAL HIGH (ref 70–99)
Potassium: 4.2 mmol/L (ref 3.5–5.1)
Sodium: 137 mmol/L (ref 135–145)

## 2018-06-22 LAB — TROPONIN I: Troponin I: 0.12 ng/mL (ref <0.03)

## 2018-06-22 LAB — MAGNESIUM: Magnesium: 2.4 mg/dL (ref 1.7–2.4)

## 2018-06-22 LAB — PHOSPHORUS: Phosphorus: 1.2 mg/dL — ABNORMAL LOW (ref 2.5–4.6)

## 2018-06-22 MED ORDER — HYDRALAZINE HCL 20 MG/ML IJ SOLN: 5.0000 mg | INTRAMUSCULAR | Status: DC | PRN

## 2018-06-22 MED ORDER — ACETAMINOPHEN 325 MG PO TABS: 650.0000 mg | ORAL_TABLET | Freq: Four times a day (QID) | ORAL | Status: DC | PRN

## 2018-06-22 MED ORDER — SODIUM PHOSPHATES 45 MMOLE/15ML IV SOLN
10.0000 mmol | Freq: Once | INTRAVENOUS | Status: AC
  Administered 2018-06-22: 10 mmol via INTRAVENOUS
  Filled 2018-06-22: qty 3.33

## 2018-06-22 MED ORDER — AMLODIPINE BESYLATE 5 MG PO TABS
5.0000 mg | ORAL_TABLET | Freq: Every day | ORAL | Status: DC
  Administered 2018-06-22: 5 mg via ORAL
  Filled 2018-06-22: qty 1

## 2018-06-22 NOTE — Discharge Summary (Signed)
[name], is a 85 y.o. female  DOB 03/25/1933  MRN [id number].  Admission date:  06/20/2018  Admitting Physician  Costin Karlyne Greenspan, MD  Discharge Date:  06/22/2018   Primary MD  Glean Hess, MD  Recommendations for primary care physician for things to follow:  -Please check CBC, BMP, LFTs during next visit -Was given [facility] Department of Health action prevention recommendation for COVID-19,  Admission Diagnosis  NAUSEA AND VOMITING   Discharge Diagnosis  NAUSEA AND VOMITING    Principal Problem:   Intractable nausea and vomiting Active Problems:   Environmental and seasonal allergies   Psoriasis vulgaris   Essential (primary) hypertension   Hyperlipidemia, mixed   Bronchiectasis ([place])      Past Medical History:  Diagnosis Date   Bronchiectasis ([place])    Cancer ([place])    left breast cancer    GERD (gastroesophageal reflux disease)    Hyperlipemia    Hypertension     Past Surgical History:  Procedure Laterality Date   BREAST LUMPECTOMY Left 2008   CHOLECYSTECTOMY     PARTIAL HYSTERECTOMY         History of present illness and  Hospital Course:     Kindly see H&P for history of present illness and admission details, please review complete Labs, Consult reports and Test reports for all details in brief  HPI  from the history and physical done on the day of admission 06/20/2018  HPI: [name] is a 85 y.o. female with medical history significant of bronchiectasis, essential hypertension, hyperlipidemia, psoriasis, right breast cancer, recent COVID-19 diagnosis.  Patient presented secondary to intractable nausea and vomiting for the last day.  She reports it has worsened since yesterday and she is now dry heaving.  She has associated diarrhea with watery stools yesterday which have resolved today.  She is not been able to keep anything down.  She has not taken  anything to help with her symptoms but what she received in the emergency department did not help too much.  She continues to feel weak.  She reports no chest pain, cough, shortness of breath, abdominal pain.  Her husband is at home who was also diagnosed with COVID-19.  She has been prescribed azithromycin and Plaquenil has been taking them for the past week.  She is on methotrexate for her psoriasis.   Hospital Course   Olivia P Helmsis a 85 y.o.femalewith medical history significant ofbronchiectasis, essential hypertension, hyperlipidemia, psoriasis, right breast cancer, recent COVID-19 diagnosis.Patient presented secondary to intractable nausea and vomiting for the last day, or she has been feeling like this for the last 4 to 5 days, with worsening recently, was dehydrated on presentation, significant nausea and vomiting, wearing IV fluid resuscitation, she did improve over last 24 hours, she was able to tolerate diet today, and she is feeling better.  Nausea/vomiting/diarrhea -This is most likely in the setting of COVID-19 infection versus Plaquenil side effects, she had poor appetite yesterday, but it did improve today, she required  IV fluids, was able to tolerate breakfast and lunch today .  COVID 19 infection -No oxygen requirement, she is on Plaquenil and Azithromycin at home, this has been held during hospital stay given her nausea and vomiting which can be related to side effects from Plaquenil, she did finish 42 days of age at least, so no indication to resume on discharge, especially with prolonged QTC yesterday  Transaminitis -LFTs are trending down, Abd exam is benign, no further work-up at this point  Elevated troponins -She denies any chest pain or shortness of breath, her EKG is nonacute, this is likely stress related,   Hypokalemia/hypophosphatemia -Repleted  Discharge Condition:  stable   Follow UP  Follow-up Information    Glean Hess, MD Follow up in 2  week(s).   Specialty:  Internal Medicine Contact information: [address] [place] [address] [phone]             Discharge Instructions  and  Discharge Medications    Discharge Instructions    Discharge instructions   Complete by:  As directed    Follow with Primary MD Glean Hess, MD in 7 days   Get CBC, CMP, 2 view Chest X ray checked  by Primary MD next visit.    Activity: As tolerated with Full fall precautions use walker/cane & assistance as needed   Disposition Home    Diet: Heart Healthy  , with feeding assistance and aspiration precautions.  For Heart failure patients - Check your Weight same time everyday, if you gain over 2 pounds, or you develop in leg swelling, experience more shortness of breath or chest pain, call your Primary MD immediately. Follow Cardiac Low Salt Diet and 1.5 lit/day fluid restriction.   On your next visit with your primary care physician please Get Medicines reviewed and adjusted.   Please request your Prim.MD to go over all Hospital Tests and Procedure/Radiological results at the follow up, please get all Hospital records sent to your Prim MD by signing hospital release before you go home.   If you experience worsening of your admission symptoms, develop shortness of breath, life threatening emergency, suicidal or homicidal thoughts you must seek medical attention immediately by calling 911 or calling your MD immediately  if symptoms less severe.  You Must read complete instructions/literature along with all the possible adverse reactions/side effects for all the Medicines you take and that have been prescribed to you. Take any new Medicines after you have completely understood and accpet all the possible adverse reactions/side effects.   Do not drive, operating heavy machinery, perform activities at heights, swimming or participation in water activities or provide baby sitting services if your were admitted  for syncope or siezures until you have seen by Primary MD or a Neurologist and advised to do so again.  Do not drive when taking Pain medications.    Do not take more than prescribed Pain, Sleep and Anxiety Medications  Special Instructions: If you have smoked or chewed Tobacco  in the last 2 yrs please stop smoking, stop any regular Alcohol  and or any Recreational drug use.  Wear Seat belts while driving.   Please note  You were cared for by a hospitalist during your hospital stay. If you have any questions about your discharge medications or the care you received while you were in the hospital after you are discharged, you can call the unit and asked to speak with the hospitalist on call if the hospitalist  that took care of you is not available. Once you are discharged, your primary care physician will handle any further medical issues. Please note that NO REFILLS for any discharge medications will be authorized once you are discharged, as it is imperative that you return to your primary care physician (or establish a relationship with a primary care physician if you do not have one) for your aftercare needs so that they can reassess your need for medications and monitor your lab values.   Increase activity slowly   Complete by:  As directed      Allergies as of 06/22/2018   No Known Allergies     Medication List    STOP taking these medications   azithromycin 250 MG tablet Commonly known as:  ZITHROMAX   hydrochlorothiazide 12.5 MG capsule Commonly known as:  MICROZIDE   hydroxychloroquine 200 MG tablet Commonly known as:  PLAQUENIL     TAKE these medications   acetaminophen 325 MG tablet Commonly known as:  TYLENOL Take 2 tablets (650 mg total) by mouth every 6 (six) hours as needed for mild pain or headache (fever >/= 101).   acidophilus Caps capsule Take 1 capsule by mouth daily.   aspirin EC 81 MG tablet Take 81 mg by mouth daily.   atorvastatin 10 MG  tablet Commonly known as:  LIPITOR TAKE 1 TABLET(10 MG) BY MOUTH DAILY What changed:  See the new instructions.   Calcium 500/D 500-200 MG-UNIT tablet Generic drug:  calcium-vitamin D Take 1 tablet by mouth daily.   cetirizine 10 MG tablet Commonly known as:  ZYRTEC Take 10 mg by mouth daily.   fluticasone 50 MCG/ACT nasal spray Commonly known as:  FLONASE SHAKE LIQUID AND USE 2 SPRAYS IN EACH NOSTRIL DAILY What changed:  See the new instructions.   folic acid 1 MG tablet Commonly known as:  FOLVITE Take 1 mg by mouth daily as needed (for supplement).   methotrexate 2.5 MG tablet Commonly known as:  RHEUMATREX Take 5 mg by mouth once a week.   metoprolol tartrate 25 MG tablet Commonly known as:  LOPRESSOR TAKE 1/2 TABLET BY MOUTH ONCE DAILY What changed:  how much to take   MULTIVITAMIN ADULT PO Take 1 tablet by mouth daily.   vitamin C 100 MG tablet Take 100 mg by mouth daily.   vitamin E 100 UNIT capsule Take 100 Units by mouth daily.         Diet and Activity recommendation: See Discharge Instructions above   Consults obtained -  None   Major procedures and Radiology Reports - PLEASE review detailed and final reports for all details, in brief -     Dg Chest Portable 1 View  Result Date: 06/20/2018 CLINICAL DATA:  Nausea, vomiting and weakness. Reported recent outside diagnosis of COVID-19 infection. EXAM: PORTABLE CHEST 1 VIEW COMPARISON:  CT 02/07/2017.  No prior radiographs available. FINDINGS: 0849 hours. The heart size and mediastinal contours are stable with aortic atherosclerosis. There is stable chronic lung disease with interstitial thickening, bronchiectasis and biapical pleural calcifications. No superimposed airspace or ground-glass opacities identified. There is no pleural effusion or pneumothorax. The bones appear unremarkable. IMPRESSION: Stable chronic lung disease without radiographic evidence of acute superimposed process. Electronically  Signed   By: Richardean Sale M.D.   On: 06/20/2018 09:39    Micro Results    No results found for this or any previous visit (from the past 240 hour(s)).     Today   Subjective:  [name] today ports she is feeling better today, as discussed with staff she was able to tolerate good amount from her breakfast and lunch today.  No more nausea, no further dry heaving or vomiting.  Objective:   Blood pressure (!) 174/72, pulse 66, temperature 97.9 F (36.6 C), temperature source Oral, resp. rate 16, height 5\' 4"  (1.626 m), weight 55.8 kg, SpO2 96 %.   Intake/Output Summary (Last 24 hours) at 06/22/2018 1438 Last data filed at 06/22/2018 1300 Gross per 24 hour  Intake 1386.13 ml  Output 1250 ml  Net 136.13 ml    Exam Awake Alert, Oriented x 3, No new F.N deficits, frail Symmetrical Chest wall movement, Good air movement bilaterally, CTAB RRR,No Gallops,Rubs or new Murmurs, No Parasternal Heave +ve B.Sounds, Abd Soft, Non tender,  No rebound -guarding or rigidity. No Cyanosis, Clubbing or edema, No new Rash or bruise  Data Review   CBC w Diff:  Lab Results  Component Value Date   WBC 9.2 06/21/2018   HGB 13.7 06/21/2018   HGB 14.4 01/04/2018   HCT 41.7 06/21/2018   HCT 41.9 01/04/2018   PLT 185 06/21/2018   PLT 202 01/04/2018   LYMPHOPCT 16 06/21/2018   LYMPHOPCT 20.7 01/05/2013   MONOPCT 10 06/21/2018   MONOPCT 6.9 01/05/2013   EOSPCT 0 06/21/2018   EOSPCT 1.9 01/05/2013   BASOPCT 0 06/21/2018   BASOPCT 0.7 01/05/2013    CMP:  Lab Results  Component Value Date   NA 137 06/22/2018   NA 144 01/04/2018   NA 140 10/07/2013   K 4.2 06/22/2018   K 4.2 10/07/2013   CL 108 06/22/2018   CL 106 10/07/2013   CO2 21 (L) 06/22/2018   CO2 26 10/07/2013   BUN 14 06/22/2018   BUN 21 01/04/2018   BUN 19 (H) 10/07/2013   CREATININE 0.54 06/22/2018   CREATININE 0.69 10/07/2013   PROT 6.7 06/21/2018   PROT 7.3 01/04/2018   PROT 8.6 (H) 01/04/2013   ALBUMIN 3.6  06/21/2018   ALBUMIN 4.7 01/04/2018   ALBUMIN 3.0 (L) 01/04/2013   BILITOT 0.7 06/21/2018   BILITOT 0.4 01/04/2018   BILITOT 0.4 01/04/2013   ALKPHOS 109 06/21/2018   ALKPHOS 136 01/04/2013   AST 54 (H) 06/21/2018   AST 67 (H) 01/04/2013   ALT 57 (H) 06/21/2018   ALT 62 01/04/2013  .   Total Time in preparing paper work, data evaluation and todays exam - 61 minutes  Phillips Climes M.D on 06/22/2018 at 2:38 PM  [facility]   Office  [phone]

## 2018-06-22 NOTE — Discharge Instructions (Signed)
Person Under Monitoring Name: [name]  Location: [address] [place] [address]   Infection Prevention Recommendations for Individuals Confirmed to have, or Being Evaluated for, 2019 Novel Coronavirus (COVID-19) Infection Who Receive Care at Home  Individuals who are confirmed to have, or are being evaluated for, COVID-19 should follow the prevention steps below until a healthcare provider or local or state health department says they can return to normal activities.  Stay home except to get medical care You should restrict activities outside your home, except for getting medical care. Do not go to work, school, or public areas, and do not use public transportation or taxis.  Call ahead before visiting your doctor Before your medical appointment, call the healthcare provider and tell them that you have, or are being evaluated for, COVID-19 infection. This will help the healthcare providers office take steps to keep other people from getting infected. Ask your healthcare provider to call the local or state health department.  Monitor your symptoms Seek prompt medical attention if your illness is worsening (e.g., difficulty breathing). Before going to your medical appointment, call the healthcare provider and tell them that you have, or are being evaluated for, COVID-19 infection. Ask your healthcare provider to call the local or state health department.  Wear a facemask You should wear a facemask that covers your nose and mouth when you are in the same room with other people and when you visit a healthcare provider. People who live with or visit you should also wear a facemask while they are in the same room with you.  Separate yourself from other people in your home As much as possible, you should stay in a different room from other people in your home. Also, you should use a separate bathroom, if available.  Avoid sharing household items You should not share  dishes, drinking glasses, cups, eating utensils, towels, bedding, or other items with other people in your home. After using these items, you should wash them thoroughly with soap and water.  Cover your coughs and sneezes Cover your mouth and nose with a tissue when you cough or sneeze, or you can cough or sneeze into your sleeve. Throw used tissues in a lined trash can, and immediately wash your hands with soap and water for at least 20 seconds or use an alcohol-based hand rub.  Wash your [facility] your hands often and thoroughly with soap and water for at least 20 seconds. You can use an alcohol-based hand sanitizer if soap and water are not available and if your hands are not visibly dirty. Avoid touching your eyes, nose, and mouth with unwashed hands.   Prevention Steps for Caregivers and Household Members of Individuals Confirmed to have, or Being Evaluated for, COVID-19 Infection Being Cared for in the Home  If you live with, or provide care at home for, a person confirmed to have, or being evaluated for, COVID-19 infection please follow these guidelines to prevent infection:  Follow healthcare providers instructions Make sure that you understand and can help the patient follow any healthcare provider instructions for all care.  Provide for the patients basic needs You should help the patient with basic needs in the home and provide support for getting groceries, prescriptions, and other personal needs.  Monitor the patients symptoms If they are getting sicker, call his or her medical provider and tell them that the patient has, or is being evaluated for, COVID-19 infection. This will help the healthcare providers office  take steps to keep other people from getting infected. Ask the healthcare provider to call the local or state health department.  Limit the number of people who have contact with the patient  If possible, have only one caregiver for the patient.  Other  household members should stay in another home or place of residence. If this is not possible, they should stay  in another room, or be separated from the patient as much as possible. Use a separate bathroom, if available.  Restrict visitors who do not have an essential need to be in the home.  Keep older adults, very young children, and other sick people away from the patient Keep older adults, very young children, and those who have compromised immune systems or chronic health conditions away from the patient. This includes people with chronic heart, lung, or kidney conditions, diabetes, and cancer.  Ensure good ventilation Make sure that shared spaces in the home have good air flow, such as from an air conditioner or an opened window, weather permitting.  Wash your hands often  Wash your hands often and thoroughly with soap and water for at least 20 seconds. You can use an alcohol based hand sanitizer if soap and water are not available and if your hands are not visibly dirty.  Avoid touching your eyes, nose, and mouth with unwashed hands.  Use disposable paper towels to dry your hands. If not available, use dedicated cloth towels and replace them when they become wet.  Wear a facemask and gloves  Wear a disposable facemask at all times in the room and gloves when you touch or have contact with the patients blood, body fluids, and/or secretions or excretions, such as sweat, saliva, sputum, nasal mucus, vomit, urine, or feces.  Ensure the mask fits over your nose and mouth tightly, and do not touch it during use.  Throw out disposable facemasks and gloves after using them. Do not reuse.  Wash your hands immediately after removing your facemask and gloves.  If your personal clothing becomes contaminated, carefully remove clothing and launder. Wash your hands after handling contaminated clothing.  Place all used disposable facemasks, gloves, and other waste in a lined container before  disposing them with other household waste.  Remove gloves and wash your hands immediately after handling these items.  Do not share dishes, glasses, or other household items with the patient  Avoid sharing household items. You should not share dishes, drinking glasses, cups, eating utensils, towels, bedding, or other items with a patient who is confirmed to have, or being evaluated for, COVID-19 infection.  After the person uses these items, you should wash them thoroughly with soap and water.  Wash laundry thoroughly  Immediately remove and wash clothes or bedding that have blood, body fluids, and/or secretions or excretions, such as sweat, saliva, sputum, nasal mucus, vomit, urine, or feces, on them.  Wear gloves when handling laundry from the patient.  Read and follow directions on labels of laundry or clothing items and detergent. In general, wash and dry with the warmest temperatures recommended on the label.  Clean all areas the individual has used often  Clean all touchable surfaces, such as counters, tabletops, doorknobs, bathroom fixtures, toilets, phones, keyboards, tablets, and bedside tables, every day. Also, clean any surfaces that may have blood, body fluids, and/or secretions or excretions on them.  Wear gloves when cleaning surfaces the patient has come in contact with.  Use a diluted bleach solution (e.g., dilute bleach with 1 part  bleach and 10 parts water) or a household disinfectant with a label that says EPA-registered for coronaviruses. To make a bleach solution at home, add 1 tablespoon of bleach to 1 quart (4 cups) of water. For a larger supply, add  cup of bleach to 1 gallon (16 cups) of water.  Read labels of cleaning products and follow recommendations provided on product labels. Labels contain instructions for safe and effective use of the cleaning product including precautions you should take when applying the product, such as wearing gloves or eye protection  and making sure you have good ventilation during use of the product.  Remove gloves and wash hands immediately after cleaning.  Monitor yourself for signs and symptoms of illness Caregivers and household members are considered close contacts, should monitor their health, and will be asked to limit movement outside of the home to the extent possible. Follow the monitoring steps for close contacts listed on the symptom monitoring form.   ? If you have additional questions, contact your local health department or call the epidemiologist on call at [phone] (available 24/7). ? This guidance is subject to change. For the most up-to-date guidance from [facility], please refer to their website: [email or web address]

## 2018-06-22 NOTE — Progress Notes (Signed)
Physical Therapy Treatment Patient Details Name: [name] MRN: [id number] DOB: 29-Jul-1933 Today's Date: 06/22/2018    History of Present Illness [name] is a 85 y.o. female with medical history significant of bronchiectasis, essential hypertension, hyperlipidemia, psoriasis, right breast cancer, recent COVID-19 diagnosis.  Patient presented secondary to intractable nausea and vomiting for the last day, or she has been feeling like this for the last 4 to 5 days, with worsening recently,.    PT Comments    Pt is progressing well; still c/o nausea; slowly increasing activity tolerance/continues to fatigue easily--VSS, SpO2=96% on RA during activity. Continue to follow in acute setting   Follow Up Recommendations  No PT follow up     Equipment Recommendations  None recommended by PT    Recommendations for Other Services       Precautions / Restrictions Precautions Precautions: Fall Precaution Comments: droplet/contact Restrictions Weight Bearing Restrictions: No    Mobility  Bed Mobility Overal bed mobility: Independent                Transfers Overall transfer level: Needs assistance Equipment used: 1 person hand held assist Transfers: Sit to/from Stand;Stand Pivot Transfers Sit to Stand: Min guard;Min assist Stand pivot transfers: Min guard       General transfer comment: cues for hand placement; repeated sit to stand trials for toileting and to incr activity tol/strength  Ambulation/Gait Ambulation/Gait assistance: Min assist;Min guard Gait Distance (Feet): 45 Feet Assistive device: 1 person hand held assist;None Gait Pattern/deviations: Step-through pattern;Decreased stride length     General Gait Details: amb in room, initial HHA, then min/guard without HHA; fatigues easily, HR 70s, Sats 96% on RA   Stairs             Wheelchair Mobility    Modified Rankin (Stroke Patients Only)       Balance     Sitting balance-Leahy Scale:  Good     Standing balance support: No upper extremity supported Standing balance-Leahy Scale: Fair                              Cognition Arousal/Alertness: Awake/alert Behavior During Therapy: WFL for tasks assessed/performed Overall Cognitive Status: Within Functional Limits for tasks assessed                                 General Comments: pt pleasant and cooperative, generally  not feeling well/tired       Exercises General Exercises - Lower Extremity Long Arc Quad: AROM;Strengthening;Both;10 reps;Seated Hip Flexion/Marching: AROM;Strengthening;Both;10 reps;Standing Heel Raises: AROM;Strengthening;Both;10 reps;Standing    General Comments        Pertinent Vitals/Pain Pain Assessment: No/denies pain    Home Living                      Prior Function            PT Goals (current goals can now be found in the care plan section) Acute Rehab PT Goals Patient Stated Goal: feel better PT Goal Formulation: With patient Time For Goal Achievement: 06/28/18 Potential to Achieve Goals: Good Progress towards PT goals: Progressing toward goals    Frequency    Min 3X/week      PT Plan Current plan remains appropriate    Co-evaluation              AM-PAC PT "  6 Clicks" Mobility   Outcome Measure  Help needed turning from your back to your side while in a flat bed without using bedrails?: None Help needed moving from lying on your back to sitting on the side of a flat bed without using bedrails?: None Help needed moving to and from a bed to a chair (including a wheelchair)?: A Little Help needed standing up from a chair using your arms (e.g., wheelchair or bedside chair)?: A Little Help needed to walk in hospital room?: A Little Help needed climbing 3-5 steps with a railing? : A Little 6 Click Score: 20    End of Session   Activity Tolerance: Patient tolerated treatment well Patient left: in bed;with call bell/phone  within reach;with bed alarm set   PT Visit Diagnosis: Unsteadiness on feet (R26.81)     Time: 1106-1130 PT Time Calculation (min) (ACUTE ONLY): 24 min  Charges:  $Gait Training: 8-22 mins $Therapeutic Exercise: 8-22 mins                     Kenyon Ana, PT  Pager: [phone] Acute Rehab Dept [facility]): [phone]   06/22/2018    [facility] 06/22/2018, 11:46 AM

## 2018-06-25 ENCOUNTER — Telehealth: Payer: Self-pay

## 2018-06-25 ENCOUNTER — Other Ambulatory Visit: Payer: Self-pay

## 2018-06-25 NOTE — Telephone Encounter (Signed)
Transition Care Management Follow-up Telephone Call  Date of discharge and from where: 06/22/18 [name]  How have you been since you were released from the hospital? Pt states she feels weak, denies pain.   Any questions or concerns? No   Items Reviewed:  Did the pt receive and understand the discharge instructions provided? Yes   Medications obtained and verified? Yes   Any new allergies since your discharge? No   Dietary orders reviewed? Yes  Do you have support at home? Yes   Functional Questionnaire: (I = Independent and D = Dependent) ADLs: I  Bathing/Dressing- I  Meal Prep- D  Eating- I  Maintaining continence- I  Transferring/Ambulation- I  Managing Meds- I  Follow up appointments reviewed:   PCP Hospital f/u appt confirmed? Yes  Scheduled to see Dr. Army Melia on 07/05/18 @ 9:20.  Are transportation arrangements needed? No   If their condition worsens, is the pt aware to call PCP or go to the Emergency Dept.? Yes  Was the patient provided with contact information for the PCP's office or ED? Yes  Was to pt encouraged to call back with questions or concerns? Yes

## 2018-06-30 ENCOUNTER — Other Ambulatory Visit: Payer: Self-pay | Admitting: Internal Medicine

## 2018-07-05 ENCOUNTER — Encounter: Payer: Self-pay | Admitting: Internal Medicine

## 2018-07-05 ENCOUNTER — Ambulatory Visit (INDEPENDENT_AMBULATORY_CARE_PROVIDER_SITE_OTHER): Payer: Medicare Other | Admitting: Internal Medicine

## 2018-07-05 VITALS — BP 98/64 | HR 74 | Temp 97.7°F | Ht 64.0 in | Wt 123.0 lb

## 2018-07-05 DIAGNOSIS — I10 Essential (primary) hypertension: Secondary | ICD-10-CM | POA: Diagnosis not present

## 2018-07-05 DIAGNOSIS — Z8616 Personal history of COVID-19: Secondary | ICD-10-CM

## 2018-07-05 DIAGNOSIS — R300 Dysuria: Secondary | ICD-10-CM

## 2018-07-05 DIAGNOSIS — Z8619 Personal history of other infectious and parasitic diseases: Secondary | ICD-10-CM

## 2018-07-05 DIAGNOSIS — L84 Corns and callosities: Secondary | ICD-10-CM

## 2018-07-05 DIAGNOSIS — R112 Nausea with vomiting, unspecified: Secondary | ICD-10-CM | POA: Diagnosis not present

## 2018-07-05 DIAGNOSIS — J479 Bronchiectasis, uncomplicated: Secondary | ICD-10-CM | POA: Diagnosis not present

## 2018-07-05 LAB — POCT URINALYSIS DIPSTICK
Bilirubin, UA: NEGATIVE
Blood, UA: NEGATIVE
Glucose, UA: NEGATIVE
Ketones, UA: NEGATIVE
Leukocytes, UA: NEGATIVE
Nitrite, UA: NEGATIVE
Protein, UA: NEGATIVE
Spec Grav, UA: 1.01 (ref 1.010–1.025)
Urobilinogen, UA: 0.2 E.U./dL
pH, UA: 6.5 (ref 5.0–8.0)

## 2018-07-05 NOTE — Progress Notes (Signed)
Date:  07/05/2018   Name:  [name]   DOB:  [birth year]   MRN:  [id number]   Chief Complaint: Hypertension Patient presents for routine follow up but was recently hospitalized with n/v after being treated for Covid-19 at home with Zpak and Plaquenil.  She had some elevated liver function tests that need to be rechecked. Since being home she has slowly improved. She still feels very weak, but no cough or fever.  She is eating and using Ensure supplements.  Hypertension  This is a chronic problem. The problem is unchanged. The problem is controlled. Pertinent negatives include no chest pain, headaches, palpitations or shortness of breath.  Dysuria   This is a new problem. The current episode started in the past 7 days. The problem occurs every urination. The quality of the pain is described as burning. The pain is mild. There has been no fever. Associated symptoms include frequency. Pertinent negatives include no chills, hematuria, nausea, urgency or vomiting.  Bronchiectasis - followed by Dr. Raul Del. CXR just prior to discharge showed stable changes. IMPRESSION: Stable chronic lung disease without radiographic evidence of acute superimposed process.  Foot problem - she has a hard area under ball of the left foot.  Has been there for some time but becoming more painful to walk.  Review of Systems  Constitutional: Negative for chills, diaphoresis, fever and unexpected weight change.  HENT: Negative for trouble swallowing.   Respiratory: Negative for chest tightness, shortness of breath and wheezing.   Cardiovascular: Negative for chest pain, palpitations and leg swelling.  Gastrointestinal: Negative for abdominal pain, constipation, diarrhea, nausea and vomiting.  Genitourinary: Positive for dysuria and frequency. Negative for hematuria and urgency.  Musculoskeletal:       Knot on bottom of left foot and pain at lateral base of 5th toe  Skin: Negative for color change and rash.   Neurological: Positive for weakness. Negative for dizziness, numbness and headaches.  Psychiatric/Behavioral: Negative for sleep disturbance.    Patient Active Problem List   Diagnosis Date Noted  . COVID-19 virus detected 06/22/2018  . Intractable nausea and vomiting 06/20/2018  . BCC (basal cell carcinoma), face 10/12/2017  . Environmental and seasonal allergies 10/30/2014  . Psoriasis vulgaris 10/30/2014  . Essential (primary) hypertension 10/30/2014  . Gastro-esophageal reflux disease without esophagitis 10/30/2014  . Hot flash, menopausal 10/30/2014  . Hyperlipidemia, mixed 10/30/2014  . History of cancer of right breast 10/30/2014  . Bronchiectasis ([place]) 09/24/2013    No Known Allergies  Past Surgical History:  Procedure Laterality Date  . BREAST LUMPECTOMY Left 2008  . CHOLECYSTECTOMY    . PARTIAL HYSTERECTOMY      Social History   Tobacco Use  . Smoking status: Never Smoker  . Smokeless tobacco: Never Used  Substance Use Topics  . Alcohol use: No    Alcohol/week: 2.0 standard drinks    Types: 2 Standard drinks or equivalent per week  . Drug use: No     Medication list has been reviewed and updated.  Current Meds  Medication Sig  . acetaminophen (TYLENOL) 325 MG tablet Take 2 tablets (650 mg total) by mouth every 6 (six) hours as needed for mild pain or headache (fever >/= 101).  Marland Kitchen acidophilus (RISAQUAD) CAPS capsule Take 1 capsule by mouth daily.  . Ascorbic Acid (VITAMIN C) 100 MG tablet Take 100 mg by mouth daily.  Marland Kitchen aspirin EC 81 MG tablet Take 81 mg by mouth daily.  Marland Kitchen atorvastatin (LIPITOR) 10  MG tablet TAKE 1 TABLET(10 MG) BY MOUTH DAILY (Patient taking differently: Take 10 mg by mouth daily. )  . calcium-vitamin D (CALCIUM 500/D) 500-200 MG-UNIT tablet Take 1 tablet by mouth daily.  . cetirizine (ZYRTEC) 10 MG tablet Take 10 mg by mouth daily.  . fluticasone (FLONASE) 50 MCG/ACT nasal spray SHAKE LIQUID AND USE 2 SPRAYS IN EACH NOSTRIL DAILY  (Patient taking differently: Place 2 sprays into both nostrils daily as needed for allergies or rhinitis. )  . folic acid (FOLVITE) 1 MG tablet Take 1 mg by mouth daily as needed (for supplement).   . methotrexate (RHEUMATREX) 2.5 MG tablet Take 5 mg by mouth once a week.   . metoprolol tartrate (LOPRESSOR) 25 MG tablet TAKE 1/2 TABLET BY MOUTH ONCE DAILY (Patient taking differently: Take 25 mg by mouth daily. )  . Multiple Vitamins-Minerals (MULTIVITAMIN ADULT PO) Take 1 tablet by mouth daily.     PHQ 2/9 Scores 07/05/2018 01/04/2018 12/29/2016 12/29/2015  PHQ - 2 Score 0 0 0 0    BP Readings from Last 3 Encounters:  07/05/18 98/64  06/22/18 (!) 156/75  06/20/18 (!) 181/81    Physical Exam Constitutional:      Appearance: Normal appearance.  Neck:     Musculoskeletal: Normal range of motion and neck supple.  Cardiovascular:     Rate and Rhythm: Normal rate and regular rhythm.     Pulses: Normal pulses.  Pulmonary:     Effort: Pulmonary effort is normal.     Breath sounds: Decreased breath sounds present. No wheezing or rhonchi.  Abdominal:     Palpations: Abdomen is soft.     Tenderness: There is abdominal tenderness in the suprapubic area. There is no guarding or rebound.  Musculoskeletal:       Feet:  Lymphadenopathy:     Cervical: No cervical adenopathy.  Neurological:     Mental Status: She is alert.  Psychiatric:        Attention and Perception: Attention normal.        Mood and Affect: Mood normal.     Comments: Mildly confused about the timing of her illness     Wt Readings from Last 3 Encounters:  07/05/18 123 lb (55.8 kg)  06/20/18 123 lb 0.3 oz (55.8 kg)  06/20/18 123 lb (55.8 kg)    BP 98/64   Pulse 74   Temp 97.7 F (36.5 C) (Oral)   Ht 5\' 4"  (1.626 m)   Wt 123 lb (55.8 kg)   SpO2 93%   BMI 21.11 kg/m   Assessment and Plan: 1. History of 2019 novel coronavirus disease (COVID-19) Diagnosed on 06/13/18; completed treatment and now released from  quarantine by HD  2. Intractable nausea and vomiting Resolved but will some residual electrolyte disturbance that needs to be monitored - CBC with Differential/Platelet  3. Essential (primary) hypertension Controlled Continue healthy diet, supplements, sufficient fluids - Comprehensive metabolic panel - Phosphorus  4. Bronchiectasis without complication ([place]) stable  5. Callus of foot - Ambulatory referral to Podiatry  6. Dysuria UA negative; try increasing fluids, cranberry juice - POCT urinalysis dipstick   Partially dictated using Editor, commissioning. Any errors are unintentional.  Halina Maidens, MD [facility]  07/05/2018

## 2018-07-06 LAB — CBC WITH DIFFERENTIAL/PLATELET
Basophils Absolute: 0 10*3/uL (ref 0.0–0.2)
Basos: 0 %
EOS (ABSOLUTE): 0.2 10*3/uL (ref 0.0–0.4)
Eos: 3 %
Hematocrit: 41.8 % (ref 34.0–46.6)
Hemoglobin: 13.7 g/dL (ref 11.1–15.9)
Immature Grans (Abs): 0 10*3/uL (ref 0.0–0.1)
Immature Granulocytes: 0 %
Lymphocytes Absolute: 2.1 10*3/uL (ref 0.7–3.1)
Lymphs: 23 %
MCH: 30 pg (ref 26.6–33.0)
MCHC: 32.8 g/dL (ref 31.5–35.7)
MCV: 92 fL (ref 79–97)
Monocytes Absolute: 0.6 10*3/uL (ref 0.1–0.9)
Monocytes: 7 %
Neutrophils Absolute: 6.1 10*3/uL (ref 1.4–7.0)
Neutrophils: 67 %
Platelets: 277 10*3/uL (ref 150–450)
RBC: 4.57 x10E6/uL (ref 3.77–5.28)
RDW: 12.9 % (ref 11.7–15.4)
WBC: 9.2 10*3/uL (ref 3.4–10.8)

## 2018-07-06 LAB — COMPREHENSIVE METABOLIC PANEL
ALT: 19 IU/L (ref 0–32)
AST: 25 IU/L (ref 0–40)
Albumin/Globulin Ratio: 1.8 (ref 1.2–2.2)
Albumin: 4.7 g/dL — ABNORMAL HIGH (ref 3.6–4.6)
Alkaline Phosphatase: 88 IU/L (ref 39–117)
BUN/Creatinine Ratio: 31 — ABNORMAL HIGH (ref 12–28)
BUN: 21 mg/dL (ref 8–27)
Bilirubin Total: 0.5 mg/dL (ref 0.0–1.2)
CO2: 25 mmol/L (ref 20–29)
Calcium: 9.7 mg/dL (ref 8.7–10.3)
Chloride: 101 mmol/L (ref 96–106)
Creatinine, Ser: 0.68 mg/dL (ref 0.57–1.00)
GFR calc Af Amer: 94 mL/min/{1.73_m2} (ref >59)
GFR calc non Af Amer: 82 mL/min/{1.73_m2} (ref >59)
Globulin, Total: 2.6 g/dL (ref 1.5–4.5)
Glucose: 84 mg/dL (ref 65–99)
Potassium: 4 mmol/L (ref 3.5–5.2)
Sodium: 140 mmol/L (ref 134–144)
Total Protein: 7.3 g/dL (ref 6.0–8.5)

## 2018-07-06 LAB — PHOSPHORUS: Phosphorus: 3.6 mg/dL (ref 3.0–4.3)

## 2018-07-17 ENCOUNTER — Other Ambulatory Visit: Payer: Self-pay

## 2018-07-17 ENCOUNTER — Encounter: Payer: Self-pay | Admitting: Podiatry

## 2018-07-17 ENCOUNTER — Ambulatory Visit (INDEPENDENT_AMBULATORY_CARE_PROVIDER_SITE_OTHER): Payer: Medicare Other | Admitting: Podiatry

## 2018-07-17 VITALS — Temp 98.1°F

## 2018-07-17 DIAGNOSIS — L989 Disorder of the skin and subcutaneous tissue, unspecified: Secondary | ICD-10-CM | POA: Diagnosis not present

## 2018-07-20 NOTE — Progress Notes (Signed)
   Subjective: 85 year old female presenting today as a new patient with a chief complaint of painful callus lesions on the plantar aspect of the left foot that have been present for at least the past year. She states the pain has increases in the past 6 months and now feels like she is walking on rocks. She has not done anything for treatment. Patient is here for further evaluation and treatment.   Past Medical History:  Diagnosis Date  . Bronchiectasis ([place])   . Cancer [facility])    left breast cancer   . GERD (gastroesophageal reflux disease)   . Hyperlipemia   . Hypertension      Objective:  Physical Exam General: Alert and oriented x3 in no acute distress  Dermatology: Hyperkeratotic lesions present on the left foot x 2. Pain on palpation with a central nucleated core noted. Skin is warm, dry and supple bilateral lower extremities. Negative for open lesions or macerations.  Vascular: Palpable pedal pulses bilaterally. No edema or erythema noted. Capillary refill within normal limits.  Neurological: Epicritic and protective threshold grossly intact bilaterally.   Musculoskeletal Exam: Pain on palpation at the keratotic lesion noted. Range of motion within normal limits bilateral. Muscle strength 5/5 in all groups bilateral.  Assessment: 1. Porokeratosis left foot x 2   Plan of Care:  1. Patient evaluated 2. Excisional debridement of keratoic lesion using a chisel blade was performed without incident.  3. Salinocaine applied and light dressing placed.  4. Patient is to return to the clinic PRN.   Edrick Kins, DPM [facility]  Dr. Edrick Kins, [address]                                        [place], [place] [address]                Office [phone]  Fax [phone]

## 2018-08-03 ENCOUNTER — Other Ambulatory Visit: Payer: Self-pay

## 2018-08-03 ENCOUNTER — Ambulatory Visit: Payer: Medicare Other | Admitting: Internal Medicine

## 2018-08-03 ENCOUNTER — Other Ambulatory Visit: Payer: Self-pay | Admitting: Internal Medicine

## 2018-08-03 ENCOUNTER — Encounter: Payer: Self-pay | Admitting: Internal Medicine

## 2018-08-03 VITALS — BP 102/68 | HR 69 | Ht 64.0 in | Wt 123.0 lb

## 2018-08-03 DIAGNOSIS — N3001 Acute cystitis with hematuria: Secondary | ICD-10-CM | POA: Diagnosis not present

## 2018-08-03 LAB — POC URINALYSIS WITH MICROSCOPIC (NON AUTO)MANUAL RESULT
Bilirubin, UA: NEGATIVE
Crystals: 0
Epithelial cells, urine per micros: 0
Glucose, UA: NEGATIVE
Ketones, UA: NEGATIVE
Mucus, UA: 0
Nitrite, UA: POSITIVE
Protein, UA: NEGATIVE
RBC: 5 M/uL (ref 4.04–5.48)
Spec Grav, UA: 1.01 (ref 1.010–1.025)
Urobilinogen, UA: 0.2 E.U./dL
pH, UA: 6 (ref 5.0–8.0)

## 2018-08-03 MED ORDER — CEFUROXIME AXETIL 250 MG PO TABS: 250.0000 mg | ORAL_TABLET | Freq: Two times a day (BID) | ORAL | 0 refills | Status: AC

## 2018-08-03 NOTE — Progress Notes (Signed)
Date:  08/03/2018   Name:  [name]   DOB:  September 19, 1933   MRN:  [id number]   Chief Complaint: Urinary Urgency (Patient havibng pain and urgency. Burning while urinating. Started a week ago. )  Dysuria   This is a new problem. The current episode started in the past 7 days. The problem occurs every urination. The quality of the pain is described as burning. The pain is mild. There has been no fever. Associated symptoms include frequency, hematuria and urgency. Pertinent negatives include no chills, flank pain or vomiting. She has tried increased fluids for the symptoms.    Review of Systems  Constitutional: Negative for chills.  Gastrointestinal: Negative for vomiting.  Genitourinary: Positive for dysuria, frequency, hematuria and urgency. Negative for flank pain.    Patient Active Problem List   Diagnosis Date Noted  . COVID-19 virus detected 06/22/2018  . Intractable nausea and vomiting 06/20/2018  . BCC (basal cell carcinoma), face 10/12/2017  . Environmental and seasonal allergies 10/30/2014  . Psoriasis vulgaris 10/30/2014  . Essential (primary) hypertension 10/30/2014  . Gastro-esophageal reflux disease without esophagitis 10/30/2014  . Hot flash, menopausal 10/30/2014  . Hyperlipidemia, mixed 10/30/2014  . History of cancer of right breast 10/30/2014  . Bronchiectasis ([place]) 09/24/2013    No Known Allergies  Past Surgical History:  Procedure Laterality Date  . BREAST LUMPECTOMY Left 2008  . CHOLECYSTECTOMY    . PARTIAL HYSTERECTOMY      Social History   Tobacco Use  . Smoking status: Never Smoker  . Smokeless tobacco: Never Used  Substance Use Topics  . Alcohol use: No    Alcohol/week: 2.0 standard drinks    Types: 2 Standard drinks or equivalent per week  . Drug use: No    Medication list has been reviewed and updated.  Current Meds  Medication Sig  . acetaminophen (TYLENOL) 325 MG tablet Take 2 tablets (650 mg total) by mouth every 6 (six) hours  as needed for mild pain or headache (fever >/= 101).  Marland Kitchen acidophilus (RISAQUAD) CAPS capsule Take 1 capsule by mouth daily.  . Ascorbic Acid (VITAMIN C) 100 MG tablet Take 100 mg by mouth daily.  Marland Kitchen aspirin EC 81 MG tablet Take 81 mg by mouth daily.  Marland Kitchen atorvastatin (LIPITOR) 10 MG tablet TAKE 1 TABLET(10 MG) BY MOUTH DAILY (Patient taking differently: Take 10 mg by mouth daily. )  . calcium-vitamin D (CALCIUM 500/D) 500-200 MG-UNIT tablet Take 1 tablet by mouth daily.  . cetirizine (ZYRTEC) 10 MG tablet Take 10 mg by mouth daily.  . fluticasone (FLONASE) 50 MCG/ACT nasal spray SHAKE LIQUID AND USE 2 SPRAYS IN EACH NOSTRIL DAILY (Patient taking differently: Place 2 sprays into both nostrils daily as needed for allergies or rhinitis. )  . folic acid (FOLVITE) 1 MG tablet Take 1 mg by mouth daily as needed (for supplement).   . methotrexate (RHEUMATREX) 2.5 MG tablet Take 5 mg by mouth once a week.   . metoprolol tartrate (LOPRESSOR) 25 MG tablet TAKE 1/2 TABLET BY MOUTH ONCE DAILY (Patient taking differently: Take 25 mg by mouth daily. )  . Multiple Vitamins-Minerals (MULTIVITAMIN ADULT PO) Take 1 tablet by mouth daily.     PHQ 2/9 Scores 08/03/2018 07/05/2018 01/04/2018 12/29/2016  PHQ - 2 Score 0 0 0 0    BP Readings from Last 3 Encounters:  08/03/18 102/68  07/05/18 98/64  06/22/18 (!) 156/75    Physical Exam Vitals signs and nursing note reviewed.  Constitutional:      Appearance: She is well-developed.  Neck:     Musculoskeletal: Normal range of motion.  Cardiovascular:     Rate and Rhythm: Normal rate and regular rhythm.     Heart sounds: Normal heart sounds.  Pulmonary:     Effort: Pulmonary effort is normal. No respiratory distress.     Breath sounds: Normal breath sounds.  Abdominal:     General: Bowel sounds are normal.     Palpations: Abdomen is soft.     Tenderness: There is abdominal tenderness in the suprapubic area. There is no right CVA tenderness, left CVA  tenderness, guarding or rebound.  Musculoskeletal:     Right lower leg: No edema.     Left lower leg: No edema.  Lymphadenopathy:     Cervical: No cervical adenopathy.  Skin:    General: Skin is warm and dry.  Psychiatric:        Mood and Affect: Mood normal.     Wt Readings from Last 3 Encounters:  08/03/18 123 lb (55.8 kg)  07/05/18 123 lb (55.8 kg)  06/20/18 123 lb 0.3 oz (55.8 kg)    BP 102/68   Pulse 69   Ht 5\' 4"  (1.626 m)   Wt 123 lb (55.8 kg)   SpO2 96%   BMI 21.11 kg/m   Assessment and Plan: 1. Acute cystitis with hematuria Increase fluids - POC urinalysis w microscopic (non auto) - Urine Culture - cefUROXime (CEFTIN) 250 MG tablet; Take 1 tablet (250 mg total) by mouth 2 (two) times daily with a meal for 7 days.  Dispense: 14 tablet; Refill: 0   Partially dictated using Editor, commissioning. Any errors are unintentional.  Halina Maidens, MD [facility]  08/03/2018

## 2018-08-05 LAB — URINE CULTURE

## 2018-08-07 ENCOUNTER — Other Ambulatory Visit: Payer: Self-pay | Admitting: Internal Medicine

## 2018-08-07 DIAGNOSIS — N3000 Acute cystitis without hematuria: Secondary | ICD-10-CM

## 2018-08-07 MED ORDER — CIPROFLOXACIN HCL 250 MG PO TABS: 250.0000 mg | ORAL_TABLET | Freq: Two times a day (BID) | ORAL | 0 refills | Status: AC

## 2018-10-18 ENCOUNTER — Telehealth: Payer: Self-pay

## 2018-10-18 NOTE — Telephone Encounter (Signed)
Patient needs diagnostic mammogram ordered for this year. [place].  Please Advise.

## 2018-10-20 ENCOUNTER — Other Ambulatory Visit: Payer: Self-pay | Admitting: Internal Medicine

## 2018-10-31 ENCOUNTER — Other Ambulatory Visit: Payer: Self-pay | Admitting: Physician Assistant

## 2018-10-31 DIAGNOSIS — M5442 Lumbago with sciatica, left side: Secondary | ICD-10-CM

## 2018-11-13 ENCOUNTER — Other Ambulatory Visit: Payer: Self-pay

## 2018-11-13 ENCOUNTER — Ambulatory Visit
Admission: RE | Admit: 2018-11-13 | Discharge: 2018-11-13 | Disposition: A | Discharge status: Home / Self Care | Payer: Medicare Other | Source: Ambulatory Visit | Attending: Physician Assistant | Admitting: Physician Assistant

## 2018-11-13 DIAGNOSIS — M5442 Lumbago with sciatica, left side: Secondary | ICD-10-CM | POA: Insufficient documentation

## 2018-11-27 ENCOUNTER — Other Ambulatory Visit: Payer: Self-pay

## 2018-11-27 ENCOUNTER — Ambulatory Visit: Payer: Medicare Other | Attending: Physician Assistant | Admitting: Physical Therapy

## 2018-11-27 DIAGNOSIS — M6281 Muscle weakness (generalized): Secondary | ICD-10-CM | POA: Insufficient documentation

## 2018-11-27 DIAGNOSIS — R2681 Unsteadiness on feet: Secondary | ICD-10-CM | POA: Diagnosis not present

## 2018-11-27 DIAGNOSIS — R262 Difficulty in walking, not elsewhere classified: Secondary | ICD-10-CM | POA: Insufficient documentation

## 2018-11-27 NOTE — Therapy (Addendum)
[facility] [address] [place], [place], [address] Phone: [phone]   Fax:  [phone]  Physical Therapy Evaluation  Patient Details  Name: [name] MRN: [id number] Date of Birth: 1934-01-21 Referring Provider (PT): Vance Peper MD   Encounter Date: 11/27/2018  PT End of Session - 11/27/18 0801    Visit Number  1    Number of Visits  13    Date for PT Re-Evaluation  01/08/19    PT Start Time  0800    PT Stop Time  0850    PT Time Calculation (min)  50 min    Equipment Utilized During Treatment  Gait belt    Activity Tolerance  Patient tolerated treatment well    Behavior During Therapy  [facility] for tasks assessed/performed       Past Medical History:  Diagnosis Date  . Bronchiectasis ([place])   . Cancer [facility])    left breast cancer   . GERD (gastroesophageal reflux disease)   . Hyperlipemia   . Hypertension     Past Surgical History:  Procedure Laterality Date  . BREAST LUMPECTOMY Left 2008  . CHOLECYSTECTOMY    . PARTIAL HYSTERECTOMY      There were no vitals filed for this visit.    [facility] PT Assessment - 11/27/18 0757      Assessment   Medical Diagnosis  BLE weakness    Referring Provider (PT)  Vance Peper MD      Precautions   Precautions  Fall      Balance Screen   Has the patient fallen in the past 6 months  No    Has the patient had a decrease in activity level because of a fear of falling?   No    Is the patient reluctant to leave their home because of a fear of falling?   No      Dynamic Gait Index   Level Surface  Normal    Change in Gait Speed  Mild Impairment    Gait with Horizontal Head Turns  Mild Impairment    Gait with Vertical Head Turns  Normal    Gait and Pivot Turn  Mild Impairment    Step Over Obstacle  Normal    Step Around Obstacles  Normal    Steps  Mild Impairment    Total Score  20      SUBJECTIVE Chief complaint: Patient notes that her major concern is her inability to  rise from a seat, go up steps, walking uphill. Patient's regular exercise and wellness is daily walking 1.25 mi, golf 2 x week, lifting weights (upper body) most days. Patient notes that she has had a little bit of an issue with balance and reports increased care with things. Patient notes that when she walks she does drift from side to side.  Onset: May 2020 (possibly before) Imaging: None Recent changes in overall health/medication: No Directional pattern for falls: None specifically noted; possibly forward Prior history of physical therapy for balance: None; only for hand Follow-up appointment with MD: None scheduled Red flags (bowel/bladder changes, saddle paresthesia, personal history of cancer, chills/fever, night sweats, unrelenting pain) Negative  OBJECTIVE  MUSCULOSKELETAL: Tremor: Absent Bulk: Normal Tone: Normal, no clonus  Posture Patient sits with forward shoulders and rounded/slumped posture.  Patient has slight R lateral trunk lean in standing.  Gait Patient has some lateral drifting with walking; patient deviates bilaterally. No scissoring of steps nor ataxia present.  Strength  R/L 4/4 Hip flexion 3/3 Hip external rotation 3/3 Hip internal rotation 3+/3+ Hip extension  3+/3+ Hip abduction (seated) 4/4 Hip adduction (seated) 4/4 Knee extension 4/4 Knee flexion 4/4 Ankle Plantarflexion (seated) 4/4 Ankle Dorsiflexion   NEUROLOGICAL:  Mental Status Patient is oriented to person, place and time.  Recent memory is intact.  Remote memory is intact.  Attention span and concentration are intact.  Expressive speech is intact.  Patient's fund of knowledge is within normal limits for educational level.  Cranial Nerves Visual acuity and visual fields are intact  Extraocular muscles are intact  Facial sensation is intact bilaterally  Facial strength is intact bilaterally  Hearing is normal as tested by gross conversation Palate elevates midline, normal phonation   Shoulder shrug strength is intact  Tongue protrudes midline   Sensation Grossly intact to light touch bilateral UEs/LEs as determined by testing dermatomes C2-T2/L2-S2 respectively Proprioception and hot/cold testing deferred on this date  Reflexes R/L 2+/2+ Ankle Jerk (S1/2)  Coordination/Cerebellar Finger to Nose: WNL Heel to Shin: WNL Rapid alternating movements: WNL   FUNCTIONAL OUTCOME MEASURES   Results Comments  DGI 20/24 Borderline (19 is cutoff for 'fall risk' classification  5TSTS 23.7 seconds Fall risk, in need of intervention. Counter-lever at back of knee on seat     ASSESSMENT Patient is a 85 year old presenting to clinic with chief complaints of BLE weakness. Upon examination, patient demonstrates deficits in BLE strength, posture, and gait as evidenced by gross BLE 3+/5, 5TSTS of 23.7 seconds with BUE use and reliance on counter-lever from posterior knee against seat of chair, and DGI of 20/24. Patient's responses on FOTO (LEFS) outcome measures (62) indicate moderate functional limitations/disability/distress. Patient's progress may be limited due to age related sarcopenia; however, patient's motivation and present activity level  advantageous. Patient was able to achieve sit to stand with SUE support and without using counter-lever against chair seat during today's evaluation and responded positively to educational interventions. Patient will benefit from continued skilled therapeutic intervention to address deficits in BLE strength, posture, and gait in order to decrease risk of fall, increase function, and improve overall QOL.    Objective measurements completed on examination: See above findings.   TREATMENT  Neuromuscular Re-education: Sit to Stand with SUE support and no counter-lever on the chair, x3 Patient education on osteoporosis prevention body mechanics and postures to preserve bone health.  Patient educated on prognosis, POC, and provided with  HEP including: x3 sit to stand with every chair rise. Patient articulated understanding and returned demonstration. Patient will benefit from further education in order to maximize compliance and understanding for long-term therapeutic gains.      PT Long Term Goals - 11/27/18 1013      PT LONG TERM GOAL #1   Title  Patient will be independent with HEP in order to improve strength and balance in order to decrease fall risk and improve function at home and work.    Baseline  IE: provided    Time  6    Period  Weeks    Status  New    Target Date  01/08/19      PT LONG TERM GOAL #2   Title  Patient will improve DGI by at least 3 points in order to demonstrate clinically significant improvement in balance and decreased risk for falls.    Baseline  IE: 20/24    Time  6    Period  Weeks    Status  New  Target Date  01/08/19      PT LONG TERM GOAL #3   Title  Patient will decrease 5TSTS by at least 3 seconds without use of UE from a standard height chair in order to demonstrate clinically significant improvement in LE strength.    Baseline  IE: 23.7, BUE use, and counterlever on chair seat    Time  6    Period  Weeks    Status  New    Target Date  01/08/19      PT LONG TERM GOAL #4   Title  Patient will demonstrate appropriate body mechanics for preservation of bone health with bending and lifting in order to safely participate in community activities (golf).    Baseline  IE: reliant on high level of spine flexion    Time  6    Period  Weeks    Status  New    Target Date  01/08/19             Plan - 11/27/18 0801    Clinical Impression Statement  Patient is a 85 year old presenting to clinic with chief complaints of BLE weakness. Upon examination, patient demonstrates deficits in BLE strength, posture, and gait as evidenced by gross BLE 3+/5, 5TSTS of 23.7 seconds with BUE use and reliance on counter-lever from posterior knee against seat of chair, and DGI of 20/24.  Patient's responses on FOTO (LEFS) outcome measures (62) indicate moderate functional limitations/disability/distress. Patient's progress may be limited due to age related sarcopenia; however, patient's motivation and present activity level  advantageous. Patient was able to achieve sit to stand with SUE support and without using counter-lever against chair seat during today's evaluation and responded positively to educational interventions. Patient will benefit from continued skilled therapeutic intervention to address deficits in BLE strength, posture, and gait in order to decrease risk of fall, increase function, and improve overall QOL.    Personal Factors and Comorbidities  Age;Sex;Comorbidity 3+;Past/Current Experience;Time since onset of injury/illness/exacerbation    Comorbidities  HLD, HTN, hx of COVID 19, GERD    Examination-Activity Limitations  Squat;Lift;Stairs;Bend;Locomotion Level;Stand;Carry;Transfers    Examination-Participation Restrictions  Laundry;Shop;Cleaning;Community Activity;Yard Work    [employer]  Evolving/Moderate complexity    Clinical Decision Making  Moderate    Rehab Potential  Good    PT Frequency  2x / week    PT Duration  6 weeks    PT Treatment/Interventions  ADLs/Self Care Home Management;Aquatic Therapy;Cryotherapy;Moist Heat;Stair training;Gait training;DME Instruction;Functional mobility training;Therapeutic activities;Therapeutic exercise;Balance training;Neuromuscular re-education;Patient/family education;Manual techniques;Taping;Passive range of motion;Dry needling;Vestibular;Energy conservation    PT Next Visit Plan  BLE strengthening    PT Home Exercise Plan  3x sit to stand with every chair rise    Consulted and Agree with Plan of Care  Patient       Patient will benefit from skilled therapeutic intervention in order to improve the following deficits and impairments:  Abnormal gait, Decreased balance, Decreased endurance,  Decreased mobility, Difficulty walking, Pain, Decreased strength, Decreased coordination, Decreased range of motion, Postural dysfunction  Visit Diagnosis: Unsteadiness on feet  Muscle weakness (generalized)  Difficulty in walking, not elsewhere classified     Problem List Patient Active Problem List   Diagnosis Date Noted  . COVID-19 virus detected 06/22/2018  . Intractable nausea and vomiting 06/20/2018  . BCC (basal cell carcinoma), face 10/12/2017  . Environmental and seasonal allergies 10/30/2014  . Psoriasis vulgaris 10/30/2014  . Essential (primary) hypertension 10/30/2014  . Gastro-esophageal reflux disease without  esophagitis 10/30/2014  . Hot flash, menopausal 10/30/2014  . Hyperlipidemia, mixed 10/30/2014  . History of cancer of right breast 10/30/2014  . Bronchiectasis ([place]) 09/24/2013   Myles Gip PT, DPT [phone] 11/27/2018, 10:15 AM  [facility] [place] [address] [place], [place], [address] Phone: [phone]   Fax:  [phone]  Name: [name] MRN: [id number] Date of Birth: 04-19-1933

## 2018-12-04 ENCOUNTER — Other Ambulatory Visit: Payer: Self-pay

## 2018-12-04 ENCOUNTER — Encounter: Payer: Self-pay | Admitting: Physical Therapy

## 2018-12-04 ENCOUNTER — Ambulatory Visit: Payer: Medicare Other | Admitting: Physical Therapy

## 2018-12-04 DIAGNOSIS — R262 Difficulty in walking, not elsewhere classified: Secondary | ICD-10-CM

## 2018-12-04 DIAGNOSIS — R2681 Unsteadiness on feet: Secondary | ICD-10-CM | POA: Diagnosis not present

## 2018-12-04 DIAGNOSIS — M6281 Muscle weakness (generalized): Secondary | ICD-10-CM

## 2018-12-04 NOTE — Therapy (Signed)
[facility] [address] [place], [place], [address] Phone: [phone]   Fax:  [phone]  Physical Therapy Treatment  Patient Details  Name: [name] MRN: [id number] Date of Birth: 1933-07-22 Referring Provider (PT): Vance Peper MD   Encounter Date: 12/04/2018  PT End of Session - 12/04/18 1632    Visit Number  2    Number of Visits  13    Date for PT Re-Evaluation  01/08/19    PT Start Time  0957    PT Stop Time  1040    PT Time Calculation (min)  43 min    Equipment Utilized During Treatment  Gait belt    Activity Tolerance  Patient tolerated treatment well    Behavior During Therapy  [facility] for tasks assessed/performed       Past Medical History:  Diagnosis Date  . Bronchiectasis ([place])   . Cancer [facility])    left breast cancer   . GERD (gastroesophageal reflux disease)   . Hyperlipemia   . Hypertension     Past Surgical History:  Procedure Laterality Date  . BREAST LUMPECTOMY Left 2008  . CHOLECYSTECTOMY    . PARTIAL HYSTERECTOMY      There were no vitals filed for this visit.  Subjective Assessment - 12/04/18 1627    Subjective  Patient reports that yesterday she was quite fatigued playing golf and didn't really have any explanation for that. She denies any other concerns and reports that she has been doing her HEP.    Currently in Pain?  No/denies      TREATMENT  Therapeutic Exercise: NuStep, seat 6, L3, x5 min for warm-up and increasing tolerance to activity and BLE strength Seated exercises:  RTB hip abduction x30  1.5# CW LAQ, BLE x20  3# CW LAQ, BLE, x20  3# CW hip flexion march, BLE, x20 Standing exercises:  1.5# CW hip extension BLE x20  1.5# CW hamstring curl BLE x20 Hamstring stretch at step x2 min each leg Calf stretch at step x2 min each leg Hip flexor stretch at step x2 min each leg  Patient educated throughout session on appropriate technique and form using multi-modal cueing, HEP, and  activity modification. Patient articulated understanding and returned demonstration.  Patient Response to interventions: Patient states, "I hope I'll be able to golf tomorrow after all this. I'm teasing. This was good."  ASSESSMENT Patient presents to clinic with excellent motivation to participate in therapy. Patient demonstrates deficits in BLE strength, posture, and gait. Patient able to achieve good form with all therapeutic exercises during today's session and responded positively to active interventions. Patient will benefit from continued skilled therapeutic intervention to address remaining deficits in BLE strength, posture, and gait in order to decrease risk of fall, increase function, and improve overall QOL.    PT Long Term Goals - 11/27/18 1013      PT LONG TERM GOAL #1   Title  Patient will be independent with HEP in order to improve strength and balance in order to decrease fall risk and improve function at home and work.    Baseline  IE: provided    Time  6    Period  Weeks    Status  New    Target Date  01/08/19      PT LONG TERM GOAL #2   Title  Patient will improve DGI by at least 3 points in order to demonstrate clinically significant improvement in balance and decreased  risk for falls.    Baseline  IE: 20/24    Time  6    Period  Weeks    Status  New    Target Date  01/08/19      PT LONG TERM GOAL #3   Title  Patient will decrease 5TSTS by at least 3 seconds without use of UE from a standard height chair in order to demonstrate clinically significant improvement in LE strength.    Baseline  IE: 23.7, BUE use, and counterlever on chair seat    Time  6    Period  Weeks    Status  New    Target Date  01/08/19      PT LONG TERM GOAL #4   Title  Patient will demonstrate appropriate body mechanics for preservation of bone health with bending and lifting in order to safely participate in community activities (golf).    Baseline  IE: reliant on high level of spine  flexion    Time  6    Period  Weeks    Status  New    Target Date  01/08/19            Plan - 12/04/18 1633    Clinical Impression Statement  Patient presents to clinic with excellent motivation to participate in therapy. Patient demonstrates deficits in BLE strength, posture, and gait. Patient able to achieve good form with all therapeutic exercises during today's session and responded positively to active interventions. Patient will benefit from continued skilled therapeutic intervention to address remaining deficits in BLE strength, posture, and gait in order to decrease risk of fall, increase function, and improve overall QOL.    Personal Factors and Comorbidities  Age;Sex;Comorbidity 3+;Past/Current Experience;Time since onset of injury/illness/exacerbation    Comorbidities  HLD, HTN, hx of COVID 19, GERD    Examination-Activity Limitations  Squat;Lift;Stairs;Bend;Locomotion Level;Stand;Carry;Transfers    Examination-Participation Restrictions  Laundry;Shop;Cleaning;Community Activity;Yard Work    [employer]  Evolving/Moderate complexity    Rehab Potential  Good    PT Frequency  2x / week    PT Duration  6 weeks    PT Treatment/Interventions  ADLs/Self Care Home Management;Aquatic Therapy;Cryotherapy;Moist Heat;Stair training;Gait training;DME Instruction;Functional mobility training;Therapeutic activities;Therapeutic exercise;Balance training;Neuromuscular re-education;Patient/family education;Manual techniques;Taping;Passive range of motion;Dry needling;Vestibular;Energy conservation    PT Next Visit Plan  BLE strengthening    PT Home Exercise Plan  3x sit to stand with every chair rise    Consulted and Agree with Plan of Care  Patient       Patient will benefit from skilled therapeutic intervention in order to improve the following deficits and impairments:  Abnormal gait, Decreased balance, Decreased endurance, Decreased mobility, Difficulty walking,  Pain, Decreased strength, Decreased coordination, Decreased range of motion, Postural dysfunction  Visit Diagnosis: Unsteadiness on feet  Muscle weakness (generalized)  Difficulty in walking, not elsewhere classified     Problem List Patient Active Problem List   Diagnosis Date Noted  . COVID-19 virus detected 06/22/2018  . Intractable nausea and vomiting 06/20/2018  . BCC (basal cell carcinoma), face 10/12/2017  . Environmental and seasonal allergies 10/30/2014  . Psoriasis vulgaris 10/30/2014  . Essential (primary) hypertension 10/30/2014  . Gastro-esophageal reflux disease without esophagitis 10/30/2014  . Hot flash, menopausal 10/30/2014  . Hyperlipidemia, mixed 10/30/2014  . History of cancer of right breast 10/30/2014  . Bronchiectasis ([place]) 09/24/2013   Myles Gip PT, DPT [phone] 12/04/2018, 4:48 PM  [facility] [address]  [place], [place], [address] Phone: [phone]   Fax:  [phone]  Name: [name] MRN: [id number] Date of Birth: [birth year]

## 2018-12-11 ENCOUNTER — Other Ambulatory Visit: Payer: Self-pay

## 2018-12-11 ENCOUNTER — Encounter: Payer: Self-pay | Admitting: Physical Therapy

## 2018-12-11 ENCOUNTER — Ambulatory Visit: Payer: Medicare Other | Attending: Physician Assistant | Admitting: Physical Therapy

## 2018-12-11 DIAGNOSIS — R262 Difficulty in walking, not elsewhere classified: Secondary | ICD-10-CM | POA: Diagnosis present

## 2018-12-11 DIAGNOSIS — M6281 Muscle weakness (generalized): Secondary | ICD-10-CM | POA: Diagnosis present

## 2018-12-11 DIAGNOSIS — R2681 Unsteadiness on feet: Secondary | ICD-10-CM | POA: Diagnosis not present

## 2018-12-11 NOTE — Therapy (Signed)
[place] [facility] [address] [place], [place], [address] Phone: [phone]   Fax:  [phone]  Physical Therapy Treatment  Patient Details  Name: [name] MRN: [id number] Date of Birth: [birth year] Referring Provider (PT): Vance Peper MD   Encounter Date: 12/11/2018  PT End of Session - 12/11/18 1131    Visit Number  3    Number of Visits  13    Date for PT Re-Evaluation  01/08/19    PT Start Time  0957    PT Stop Time  1041    PT Time Calculation (min)  44 min    Equipment Utilized During Treatment  Gait belt    Activity Tolerance  Patient tolerated treatment well    Behavior During Therapy  [facility] for tasks assessed/performed       Past Medical History:  Diagnosis Date  . Bronchiectasis ([place])   . Cancer [facility])    left breast cancer   . GERD (gastroesophageal reflux disease)   . Hyperlipemia   . Hypertension     Past Surgical History:  Procedure Laterality Date  . BREAST LUMPECTOMY Left 2008  . CHOLECYSTECTOMY    . PARTIAL HYSTERECTOMY      There were no vitals filed for this visit.  Subjective Assessment - 12/11/18 1129    Subjective  Patient reports that she had no difficulty golfing the day following our session last week. She has been having some mild hip discomfort sleeping at night, but states this isn't consistent. She denies any other concerns.    Currently in Pain?  No/denies       TREATMENT  Therapeutic Exercise: NuStep, seat 6, L4, x2.5 min, L2 x2.5 min, L3 x2 min for warm-up and increasing tolerance to activity and BLE strength Sit to stand with abduction isometric hold BUE x5, RUE x5, LUE x5 Sit to stand with adduction block, RUE x5, LUE x5  Standing exercises:  4# CW hip flexion march, BLE, 2x20  4# CW hip extension BLE 2x12  4# CW hamstring curl BLE 2x12  4# CW hip abduction, BLE,2 x12  4# CW calf raises, BLE, 2x20 Hamstring stretch at step x2 min each leg Calf stretch at step x2 min each  leg Piriformis/glute stretch seated 2x 2 min each leg  Patient educated throughout session on appropriate technique and form using multi-modal cueing, HEP, and activity modification. Patient articulated understanding and returned demonstration.  Patient Response to interventions: Denies any pain or discomfort.  ASSESSMENT Patient presents to clinic with excellent motivation to participate in therapy. Patient demonstrates deficits in BLE strength, posture, and gait. Patient able to achieve good form with all therapeutic exercises against increased resistance during today's session and responded positively to active interventions. Patient will benefit from continued skilled therapeutic intervention to address remaining deficits in BLE strength, posture, and gait in order to decrease risk of fall, increase function, and improve overall QOL.    PT Long Term Goals - 11/27/18 1013      PT LONG TERM GOAL #1   Title  Patient will be independent with HEP in order to improve strength and balance in order to decrease fall risk and improve function at home and work.    Baseline  IE: provided    Time  6    Period  Weeks    Status  New    Target Date  01/08/19      PT LONG TERM GOAL #2   Title  Patient  will improve DGI by at least 3 points in order to demonstrate clinically significant improvement in balance and decreased risk for falls.    Baseline  IE: 20/24    Time  6    Period  Weeks    Status  New    Target Date  01/08/19      PT LONG TERM GOAL #3   Title  Patient will decrease 5TSTS by at least 3 seconds without use of UE from a standard height chair in order to demonstrate clinically significant improvement in LE strength.    Baseline  IE: 23.7, BUE use, and counterlever on chair seat    Time  6    Period  Weeks    Status  New    Target Date  01/08/19      PT LONG TERM GOAL #4   Title  Patient will demonstrate appropriate body mechanics for preservation of bone health with bending  and lifting in order to safely participate in community activities (golf).    Baseline  IE: reliant on high level of spine flexion    Time  6    Period  Weeks    Status  New    Target Date  01/08/19            Plan - 12/11/18 1131    Clinical Impression Statement  Patient presents to clinic with excellent motivation to participate in therapy. Patient demonstrates deficits in BLE strength, posture, and gait. Patient able to achieve good form with all therapeutic exercises against increased resistance during today's session and responded positively to active interventions. Patient will benefit from continued skilled therapeutic intervention to address remaining deficits in BLE strength, posture, and gait in order to decrease risk of fall, increase function, and improve overall QOL.    Personal Factors and Comorbidities  Age;Sex;Comorbidity 3+;Past/Current Experience;Time since onset of injury/illness/exacerbation    Comorbidities  HLD, HTN, hx of COVID 19, GERD    Examination-Activity Limitations  Squat;Lift;Stairs;Bend;Locomotion Level;Stand;Carry;Transfers    Examination-Participation Restrictions  Laundry;Shop;Cleaning;Community Activity;Yard Work    [employer]  Evolving/Moderate complexity    Rehab Potential  Good    PT Frequency  2x / week    PT Duration  6 weeks    PT Treatment/Interventions  ADLs/Self Care Home Management;Aquatic Therapy;Cryotherapy;Moist Heat;Stair training;Gait training;DME Instruction;Functional mobility training;Therapeutic activities;Therapeutic exercise;Balance training;Neuromuscular re-education;Patient/family education;Manual techniques;Taping;Passive range of motion;Dry needling;Vestibular;Energy conservation    PT Next Visit Plan  BLE strengthening    PT Home Exercise Plan  3x sit to stand with every chair rise    Consulted and Agree with Plan of Care  Patient       Patient will benefit from skilled therapeutic intervention in  order to improve the following deficits and impairments:  Abnormal gait, Decreased balance, Decreased endurance, Decreased mobility, Difficulty walking, Pain, Decreased strength, Decreased coordination, Decreased range of motion, Postural dysfunction  Visit Diagnosis: Unsteadiness on feet  Muscle weakness (generalized)  Difficulty in walking, not elsewhere classified     Problem List Patient Active Problem List   Diagnosis Date Noted  . COVID-19 virus detected 06/22/2018  . Intractable nausea and vomiting 06/20/2018  . BCC (basal cell carcinoma), face 10/12/2017  . Environmental and seasonal allergies 10/30/2014  . Psoriasis vulgaris 10/30/2014  . Essential (primary) hypertension 10/30/2014  . Gastro-esophageal reflux disease without esophagitis 10/30/2014  . Hot flash, menopausal 10/30/2014  . Hyperlipidemia, mixed 10/30/2014  . History of cancer of right breast 10/30/2014  . Bronchiectasis ([place]) 09/24/2013  Myles Gip PT, DPT [phone] 12/11/2018, 11:42 AM  [facility] [address] [place], [place], [address] Phone: [phone]   Fax:  [phone]  Name: [name] MRN: [id number] Date of Birth: 14-Mar-1933

## 2018-12-18 ENCOUNTER — Encounter: Payer: Medicare Other | Admitting: Physical Therapy

## 2018-12-20 ENCOUNTER — Other Ambulatory Visit: Payer: Self-pay

## 2018-12-20 ENCOUNTER — Ambulatory Visit: Payer: Medicare Other | Admitting: Physical Therapy

## 2018-12-20 ENCOUNTER — Encounter: Payer: Self-pay | Admitting: Physical Therapy

## 2018-12-20 DIAGNOSIS — R2681 Unsteadiness on feet: Secondary | ICD-10-CM | POA: Diagnosis not present

## 2018-12-20 DIAGNOSIS — R262 Difficulty in walking, not elsewhere classified: Secondary | ICD-10-CM

## 2018-12-20 DIAGNOSIS — M6281 Muscle weakness (generalized): Secondary | ICD-10-CM

## 2018-12-20 NOTE — Therapy (Signed)
[facility] [address] [place], [place], [address] Phone: [phone]   Fax:  [phone]  Physical Therapy Treatment  Patient Details  Name: [name] MRN: [id number] Date of Birth: [birth year] Referring Provider (PT): Vance Peper MD   Encounter Date: 12/20/2018  PT End of Session - 12/20/18 1009    Visit Number  4    Number of Visits  13    Date for PT Re-Evaluation  01/08/19    PT Start Time  0840    PT Stop Time  0925    PT Time Calculation (min)  45 min    Equipment Utilized During Treatment  Gait belt    Activity Tolerance  Patient tolerated treatment well;Patient limited by fatigue    Behavior During Therapy  [facility] for tasks assessed/performed       Past Medical History:  Diagnosis Date  . Bronchiectasis ([place])   . Cancer [facility])    left breast cancer   . GERD (gastroesophageal reflux disease)   . Hyperlipemia   . Hypertension     Past Surgical History:  Procedure Laterality Date  . BREAST LUMPECTOMY Left 2008  . CHOLECYSTECTOMY    . PARTIAL HYSTERECTOMY      There were no vitals filed for this visit.  Subjective Assessment - 12/20/18 1006    Subjective  Patient states that she has increased soreness and fatigue in B proximal quadriceps. She notes yesterday she was more fatigued than usual after her golf outing. She also reports that her hips have felt a little stiffer than usual when she wakes up, but she does not prioritize this concern.    Currently in Pain?  Other (Comment)   "It's not pain. It's tiredness."      TREATMENT  Therapeutic Exercise: NuStep, seat 6, L4, x2 min, L3 x4 min for warm-up and increasing tolerance to activity and BLE strength Sit to stand with abduction isometric hold BUE 3x5 VCs for glute dominant strategy Seated exercises:   4# CW hip flexion march, BLE, 2x20  4# CW LAQ BLE 2x12  RTB hip abduction BLE 2x25 Standing exercises:  4# CW hip flexion march, BLE, 2x15  4# CW hip  extension BLE 2x12 Hamstring stretch at step x2 min each leg Calf stretch at step x2 min each leg  Patient educated throughout session on appropriate technique and form using multi-modal cueing, HEP, and activity modification. Patient articulated understanding and returned demonstration.  Patient Response to interventions: "I feel a bit loosened up."  ASSESSMENT Patient presents to clinic with excellent motivation to participate in therapy. Patient demonstrates deficits in BLE strength, posture, and gait. Patient requiring increased rest between sets of therapeutic exercises during today's session but continues to respond positively to active interventions. Patient will benefit from continued skilled therapeutic intervention to address remaining deficits in BLE strength, posture, and gait in order to decrease risk of fall, increase function, and improve overall QOL.   PT Long Term Goals - 11/27/18 1013      PT LONG TERM GOAL #1   Title  Patient will be independent with HEP in order to improve strength and balance in order to decrease fall risk and improve function at home and work.    Baseline  IE: provided    Time  6    Period  Weeks    Status  New    Target Date  01/08/19      PT LONG TERM GOAL #2  Title  Patient will improve DGI by at least 3 points in order to demonstrate clinically significant improvement in balance and decreased risk for falls.    Baseline  IE: 20/24    Time  6    Period  Weeks    Status  New    Target Date  01/08/19      PT LONG TERM GOAL #3   Title  Patient will decrease 5TSTS by at least 3 seconds without use of UE from a standard height chair in order to demonstrate clinically significant improvement in LE strength.    Baseline  IE: 23.7, BUE use, and counterlever on chair seat    Time  6    Period  Weeks    Status  New    Target Date  01/08/19      PT LONG TERM GOAL #4   Title  Patient will demonstrate appropriate body mechanics for preservation of  bone health with bending and lifting in order to safely participate in community activities (golf).    Baseline  IE: reliant on high level of spine flexion    Time  6    Period  Weeks    Status  New    Target Date  01/08/19            Plan - 12/20/18 1009    Clinical Impression Statement  Patient presents to clinic with excellent motivation to participate in therapy. Patient demonstrates deficits in BLE strength, posture, and gait. Patient requiring increased rest between sets of therapeutic exercises during today's session but continues to respond positively to active interventions. Patient will benefit from continued skilled therapeutic intervention to address remaining deficits in BLE strength, posture, and gait in order to decrease risk of fall, increase function, and improve overall QOL.    Personal Factors and Comorbidities  Age;Sex;Comorbidity 3+;Past/Current Experience;Time since onset of injury/illness/exacerbation    Comorbidities  HLD, HTN, hx of COVID 19, GERD    Examination-Activity Limitations  Squat;Lift;Stairs;Bend;Locomotion Level;Stand;Carry;Transfers    Examination-Participation Restrictions  Laundry;Shop;Cleaning;Community Activity;Yard Work    [employer] officer  Evolving/Moderate complexity    Rehab Potential  Good    PT Frequency  2x / week    PT Duration  6 weeks    PT Treatment/Interventions  ADLs/Self Care Home Management;Aquatic Therapy;Cryotherapy;Moist Heat;Stair training;Gait training;DME Instruction;Functional mobility training;Therapeutic activities;Therapeutic exercise;Balance training;Neuromuscular re-education;Patient/family education;Manual techniques;Taping;Passive range of motion;Dry needling;Vestibular;Energy conservation    PT Next Visit Plan  BLE strengthening    PT Home Exercise Plan  3x sit to stand with every chair rise    Consulted and Agree with Plan of Care  Patient       Patient will benefit from skilled therapeutic  intervention in order to improve the following deficits and impairments:  Abnormal gait, Decreased balance, Decreased endurance, Decreased mobility, Difficulty walking, Pain, Decreased strength, Decreased coordination, Decreased range of motion, Postural dysfunction  Visit Diagnosis: Unsteadiness on feet  Muscle weakness (generalized)  Difficulty in walking, not elsewhere classified     Problem List Patient Active Problem List   Diagnosis Date Noted  . COVID-19 virus detected 06/22/2018  . Intractable nausea and vomiting 06/20/2018  . BCC (basal cell carcinoma), face 10/12/2017  . Environmental and seasonal allergies 10/30/2014  . Psoriasis vulgaris 10/30/2014  . Essential (primary) hypertension 10/30/2014  . Gastro-esophageal reflux disease without esophagitis 10/30/2014  . Hot flash, menopausal 10/30/2014  . Hyperlipidemia, mixed 10/30/2014  . History of cancer of right breast 10/30/2014  . Bronchiectasis ([place]) 09/24/2013  Myles Gip PT, DPT [phone] 12/20/2018, 10:15 AM  [place] [facility] [address] [place], [place], [address] Phone: [phone]   Fax:  [phone]  Name: [name] MRN: [id number] Date of Birth: 1933/07/10

## 2018-12-25 ENCOUNTER — Other Ambulatory Visit: Payer: Self-pay

## 2018-12-25 ENCOUNTER — Ambulatory Visit: Payer: Medicare Other | Admitting: Physical Therapy

## 2018-12-25 ENCOUNTER — Encounter: Payer: Self-pay | Admitting: Physical Therapy

## 2018-12-25 DIAGNOSIS — R2681 Unsteadiness on feet: Secondary | ICD-10-CM

## 2018-12-25 DIAGNOSIS — R262 Difficulty in walking, not elsewhere classified: Secondary | ICD-10-CM

## 2018-12-25 DIAGNOSIS — M6281 Muscle weakness (generalized): Secondary | ICD-10-CM

## 2018-12-25 NOTE — Therapy (Signed)
[facility] [facility] [address] [place], [place], [address] Phone: [phone]   Fax:  [phone]  Physical Therapy Treatment  Patient Details  Name: [name] MRN: [id number] Date of Birth: 22-Dec-1933 Referring Provider (PT): Vance Peper MD   Encounter Date: 12/25/2018  PT End of Session - 12/25/18 1003    Visit Number  5    Number of Visits  13    Date for PT Re-Evaluation  01/08/19    PT Start Time  0955    PT Stop Time  1040    PT Time Calculation (min)  45 min    Equipment Utilized During Treatment  Gait belt    Activity Tolerance  Patient tolerated treatment well;Patient limited by fatigue    Behavior During Therapy  [facility] for tasks assessed/performed       Past Medical History:  Diagnosis Date  . Bronchiectasis ([place])   . Cancer [facility])    left breast cancer   . GERD (gastroesophageal reflux disease)   . Hyperlipemia   . Hypertension     Past Surgical History:  Procedure Laterality Date  . BREAST LUMPECTOMY Left 2008  . CHOLECYSTECTOMY    . PARTIAL HYSTERECTOMY      There were no vitals filed for this visit.  Subjective Assessment - 12/25/18 1216    Subjective  Patient states that she continues to do her HEP. She cannot tell if PT is helping or not because she continues to have anterior thigh pain with inclines/stairs. Patient notes new episodes of fatigue or falls.    Currently in Pain?  No/denies       TREATMENT  Therapeutic Exercise: NuStep, seat 8, L2-5, x10 min for warm-up and increasing tolerance to activity and BLE strength Sit to stand with abduction isometric hold BUE 3x12 VCs for glute dominant strategy Standing exercises:  4# CW hip flexion 6" step taps, BLE, 2x15  4# CW lateral walking in // bars x5 laps  Neuromuscular Re-education:  4# CW step-up with eccentric lower, BLE 2x12, TCs to prevent genu valgum and increase lateral hip activation Lateral step down, BLE, 2x12 with TCs to prevent genu  valgum and increase lateral hip activation Patient education on body mechanics spine, hip, and knee posture as well as activation of hip complex when navigating stairs and incline.   Patient educated throughout session on appropriate technique and form using multi-modal cueing, HEP, and activity modification. Patient articulated understanding and returned demonstration.  Patient Response to interventions: "I feel fine; not too tired. I can feel it in my hips though"  ASSESSMENT Patient presents to clinic with excellent motivation to participate in therapy. Patient demonstrates deficits in BLE strength, posture, and gait. Patient strongly benefiting from TCs for knee control during step up and step down activities during today's session but continues to respond positively to active interventions. Patient will benefit from continued skilled therapeutic intervention to address remaining deficits in BLE strength, posture, and gait in order to decrease risk of fall, increase function, and improve overall QOL.     PT Long Term Goals - 11/27/18 1013      PT LONG TERM GOAL #1   Title  Patient will be independent with HEP in order to improve strength and balance in order to decrease fall risk and improve function at home and work.    Baseline  IE: provided    Time  6    Period  Weeks    Status  New  Target Date  01/08/19      PT LONG TERM GOAL #2   Title  Patient will improve DGI by at least 3 points in order to demonstrate clinically significant improvement in balance and decreased risk for falls.    Baseline  IE: 20/24    Time  6    Period  Weeks    Status  New    Target Date  01/08/19      PT LONG TERM GOAL #3   Title  Patient will decrease 5TSTS by at least 3 seconds without use of UE from a standard height chair in order to demonstrate clinically significant improvement in LE strength.    Baseline  IE: 23.7, BUE use, and counterlever on chair seat    Time  6    Period  Weeks     Status  New    Target Date  01/08/19      PT LONG TERM GOAL #4   Title  Patient will demonstrate appropriate body mechanics for preservation of bone health with bending and lifting in order to safely participate in community activities (golf).    Baseline  IE: reliant on high level of spine flexion    Time  6    Period  Weeks    Status  New    Target Date  01/08/19            Plan - 12/25/18 1004    Clinical Impression Statement  Patient presents to clinic with excellent motivation to participate in therapy. Patient demonstrates deficits in BLE strength, posture, and gait. Patient strongly benefiting from TCs for knee control during step up and step down activities during today's session but continues to respond positively to active interventions. Patient will benefit from continued skilled therapeutic intervention to address remaining deficits in BLE strength, posture, and gait in order to decrease risk of fall, increase function, and improve overall QOL.    Personal Factors and Comorbidities  Age;Sex;Comorbidity 3+;Past/Current Experience;Time since onset of injury/illness/exacerbation    Comorbidities  HLD, HTN, hx of COVID 19, GERD    Examination-Activity Limitations  Squat;Lift;Stairs;Bend;Locomotion Level;Stand;Carry;Transfers    Examination-Participation Restrictions  Laundry;Shop;Cleaning;Community Activity;Yard Work    [employer]  Evolving/Moderate complexity    Rehab Potential  Good    PT Frequency  2x / week    PT Duration  6 weeks    PT Treatment/Interventions  ADLs/Self Care Home Management;Aquatic Therapy;Cryotherapy;Moist Heat;Stair training;Gait training;DME Instruction;Functional mobility training;Therapeutic activities;Therapeutic exercise;Balance training;Neuromuscular re-education;Patient/family education;Manual techniques;Taping;Passive range of motion;Dry needling;Vestibular;Energy conservation    PT Next Visit Plan  BLE strengthening    PT  Home Exercise Plan  3x sit to stand with every chair rise    Consulted and Agree with Plan of Care  Patient       Patient will benefit from skilled therapeutic intervention in order to improve the following deficits and impairments:  Abnormal gait, Decreased balance, Decreased endurance, Decreased mobility, Difficulty walking, Pain, Decreased strength, Decreased coordination, Decreased range of motion, Postural dysfunction  Visit Diagnosis: Unsteadiness on feet  Muscle weakness (generalized)  Difficulty in walking, not elsewhere classified     Problem List Patient Active Problem List   Diagnosis Date Noted  . COVID-19 virus detected 06/22/2018  . Intractable nausea and vomiting 06/20/2018  . BCC (basal cell carcinoma), face 10/12/2017  . Environmental and seasonal allergies 10/30/2014  . Psoriasis vulgaris 10/30/2014  . Essential (primary) hypertension 10/30/2014  . Gastro-esophageal reflux disease without esophagitis 10/30/2014  . Hot  flash, menopausal 10/30/2014  . Hyperlipidemia, mixed 10/30/2014  . History of cancer of right breast 10/30/2014  . Bronchiectasis [facility]) 09/24/2013   Myles Gip PT, DPT [phone] 12/25/2018, 12:47 PM  [facility] [address] [place], [place], [address] Phone: [phone]   Fax:  [phone]  Name: [name] MRN: [id number] Date of Birth: May 30, 1933

## 2018-12-31 ENCOUNTER — Ambulatory Visit (INDEPENDENT_AMBULATORY_CARE_PROVIDER_SITE_OTHER): Payer: Medicare Other

## 2018-12-31 VITALS — BP 131/65 | Temp 97.3°F | Ht 64.0 in | Wt 125.0 lb

## 2018-12-31 DIAGNOSIS — Z780 Asymptomatic menopausal state: Secondary | ICD-10-CM

## 2018-12-31 DIAGNOSIS — Z0000 Encounter for general adult medical examination without abnormal findings: Secondary | ICD-10-CM

## 2018-12-31 NOTE — Progress Notes (Signed)
Subjective:   [name] is a 85 y.o. female who presents for Medicare Annual (Subsequent) preventive examination.  Virtual Visit via Telephone Note  I connected with Burman Blacksmith Yzaguirre on 12/31/18 at  8:00 AM EDT by telephone and verified that I am speaking with the correct person using two identifiers.  Medicare Annual Wellness visit completed telephonically due to Covid-19 pandemic.   Location: Patient: home Provider: office   I discussed the limitations, risks, security and privacy concerns of performing an evaluation and management service by telephone and the availability of in person appointments. The patient expressed understanding and agreed to proceed.  Some vital signs may be absent or patient reported.   Clemetine Marker, LPN    Review of Systems:   Cardiac Risk Factors include: advanced age (>10men, >16 women);dyslipidemia;hypertension     Objective:     Vitals: BP 131/65   Temp (!) 97.3 F (36.3 C)   Ht 5\' 4"  (1.626 m)   Wt 125 lb (56.7 kg)   BMI 21.46 kg/m   Body mass index is 21.46 kg/m.  Advanced Directives 12/31/2018 06/20/2018 06/20/2018 12/29/2015 03/23/2015  Does Patient Have a Medical Advance Directive? No No No No No  Would patient like information on creating a medical advance directive? Yes (MAU/Ambulatory/Procedural Areas - Information given) No - Patient declined No - Patient declined - No - patient declined information    Tobacco Social History   Tobacco Use  Smoking Status Never Smoker  Smokeless Tobacco Never Used     Counseling given: Not Answered   Clinical Intake:  Pre-visit preparation completed: Yes  Pain : No/denies pain     BMI - recorded: 21.46 Nutritional Status: BMI of 19-24  Normal Nutritional Risks: None Diabetes: No  How often do you need to have someone help you when you read instructions, pamphlets, or other written materials from your doctor or pharmacy?: 1 - Never  Interpreter Needed?: No  Information  entered by :: Clemetine Marker LPN  Past Medical History:  Diagnosis Date  . Allergy   . Bronchiectasis ([place])   . Cancer [facility])    left breast cancer   . GERD (gastroesophageal reflux disease)   . Hyperlipemia   . Hypertension    Past Surgical History:  Procedure Laterality Date  . BREAST LUMPECTOMY Left 2008  . CHOLECYSTECTOMY    . PARTIAL HYSTERECTOMY     Family History  Problem Relation Age of Onset  . Breast cancer Sister    Social History   Socioeconomic History  . Marital status: Married    Spouse name: Not on file  . Number of children: Not on file  . Years of education: Not on file  . Highest education level: Not on file  Occupational History  . Not on file  Social Needs  . Financial resource strain: Not hard at all  . Food insecurity    Worry: Never true    Inability: Never true  . Transportation needs    Medical: No    Non-medical: No  Tobacco Use  . Smoking status: Never Smoker  . Smokeless tobacco: Never Used  Substance and Sexual Activity  . Alcohol use: No  . Drug use: No  . Sexual activity: Not on file  Lifestyle  . Physical activity    Days per week: 7 days    Minutes per session: 30 min  . Stress: Not at all  Relationships  . Social connections    Talks on phone: Twice a  week    Gets together: Once a week    Attends religious service: 1 to 4 times per year    Active member of club or organization: No    Attends meetings of clubs or organizations: Never    Relationship status: Married  Other Topics Concern  . Not on file  Social History Narrative  . Not on file    Outpatient Encounter Medications as of 12/31/2018  Medication Sig  . acetaminophen (TYLENOL) 325 MG tablet Take 2 tablets (650 mg total) by mouth every 6 (six) hours as needed for mild pain or headache (fever >/= 101).  Marland Kitchen acidophilus (RISAQUAD) CAPS capsule Take 1 capsule by mouth daily.  . Ascorbic Acid (VITAMIN C) 100 MG tablet Take 100 mg by mouth daily.  Marland Kitchen aspirin EC 81 MG  tablet Take 81 mg by mouth daily.  Marland Kitchen atorvastatin (LIPITOR) 10 MG tablet TAKE 1 TABLET(10 MG) BY MOUTH DAILY (Patient taking differently: Take 10 mg by mouth daily. )  . calcium-vitamin D (CALCIUM 500/D) 500-200 MG-UNIT tablet Take 1 tablet by mouth daily.  . cetirizine (ZYRTEC) 10 MG tablet Take 10 mg by mouth daily.  . famotidine (PEPCID) 20 MG tablet Take 20 mg by mouth 2 (two) times daily. PRN reflux  . fluticasone (FLONASE) 50 MCG/ACT nasal spray SHAKE LIQUID AND USE 2 SPRAYS IN EACH NOSTRIL DAILY (Patient taking differently: Place 2 sprays into both nostrils daily as needed for allergies or rhinitis. )  . folic acid (FOLVITE) 1 MG tablet Take 1 mg by mouth daily as needed (for supplement).   . hydrochlorothiazide (MICROZIDE) 12.5 MG capsule Take 12.5 mg by mouth daily.  . methotrexate (RHEUMATREX) 2.5 MG tablet Take 5 mg by mouth once a week.   . metoprolol tartrate (LOPRESSOR) 25 MG tablet TAKE 1/2 TABLET BY MOUTH ONCE DAILY (Patient taking differently: Take 25 mg by mouth daily. )  . Multiple Vitamins-Minerals (MULTIVITAMIN ADULT PO) Take 1 tablet by mouth daily.   . Omega-3 Fatty Acids (FISH OIL) 1000 MG CAPS Take by mouth.  . triamcinolone cream (KENALOG) 0.1 % APP EXT AA BID PRN  . vitamin B-12 (CYANOCOBALAMIN) 1000 MCG tablet Take 1,000 mcg by mouth daily.   No facility-administered encounter medications on file as of 12/31/2018.     Activities of Daily Living In your present state of health, do you have any difficulty performing the following activities: 12/31/2018 06/20/2018  Hearing? N N  Comment declines hearing aids -  Vision? N N  Difficulty concentrating or making decisions? N N  Walking or climbing stairs? N N  Dressing or bathing? N N  Doing errands, shopping? N N  Preparing Food and eating ? N -  Using the Toilet? N -  In the past six months, have you accidently leaked urine? N -  Do you have problems with loss of bowel control? N -  Managing your Medications? N -   Managing your Finances? N -  Housekeeping or managing your Housekeeping? N -  Some recent data might be hidden    Patient Care Team: Glean Hess, MD as PCP - General (Internal Medicine) Dasher, Rayvon Char, MD (Dermatology) Erby Pian, MD as Referring Physician (Specialist)    Assessment:   This is a routine wellness examination for Noemi.  Exercise Activities and Dietary recommendations Current Exercise Habits: Home exercise routine, Type of exercise: walking, Time (Minutes): 30, Frequency (Times/Week): 7, Weekly Exercise (Minutes/Week): 210, Intensity: Mild, Exercise limited by: None identified  Goals    .  Prevent Falls     Prevent Falls and remain active.        Fall Risk Fall Risk  12/31/2018 08/03/2018 01/04/2018 12/29/2016 12/29/2015  Falls in the past year? 0 0 No No No  Number falls in past yr: 0 0 - - -  Injury with Fall? 0 0 - - -  Follow up Falls prevention discussed Falls evaluation completed - - -   FALL RISK PREVENTION PERTAINING TO THE HOME:  Any stairs in or around the home? Yes  If so, do they handrails? Yes   Home free of loose throw rugs in walkways, pet beds, electrical cords, etc? Yes  Adequate lighting in your home to reduce risk of falls? Yes   ASSISTIVE DEVICES UTILIZED TO PREVENT FALLS:  Life alert? No  Use of a cane, walker or w/c? No  Grab bars in the bathroom? Yes  Shower chair or bench in shower? No  Elevated toilet seat or a handicapped toilet? Yes   DME ORDERS:  DME order needed?  No   TIMED UP AND GO:  Was the test performed? No . Telephonic visit.   Education: Fall risk prevention has been discussed.  Intervention(s) required? No   Depression Screen PHQ 2/9 Scores 12/31/2018 08/03/2018 07/05/2018 01/04/2018  PHQ - 2 Score 0 0 0 0     Cognitive Function     6CIT Screen 12/31/2018 12/29/2016 12/29/2015  What Year? 0 points 0 points 0 points  What month? 0 points 0 points 0 points  What time? 0 points 0 points 0  points  Count back from 20 0 points 0 points 0 points  Months in reverse 0 points 0 points 0 points  Repeat phrase 0 points 0 points 0 points  Total Score 0 0 0    Immunization History  Administered Date(s) Administered  . Influenza Inj Mdck Quad Pf 12/15/2017  . Influenza,inj,Quad PF,6+ Mos 11/25/2014  . Influenza-Unspecified 12/06/2015, 11/25/2016, 11/23/2018  . Pneumococcal Conjugate-13 11/22/2013  . Pneumococcal Polysaccharide-23 03/08/2000  . Tdap 07/06/2007  . Zoster 03/08/2005  . Zoster Recombinat (Shingrix) 01/06/2017, 05/17/2017    Qualifies for Shingles Vaccine? Shingrix series completed.   Tdap: Although this vaccine is not a covered service during a Wellness Exam, does the patient still wish to receive this vaccine today?  No .  Education has been provided regarding the importance of this vaccine. Advised may receive this vaccine at local pharmacy or Health Dept. Aware to provide a copy of the vaccination record if obtained from local pharmacy or Health Dept. Verbalized acceptance and understanding.  Flu Vaccine: Up to date  Pneumococcal Vaccine: Up to date   Screening Tests Health Maintenance  Topic Date Due  . TETANUS/TDAP  01/05/2019 (Originally 07/05/2017)  . INFLUENZA VACCINE  Completed  . DEXA SCAN  Addressed  . PNA vac Low Risk Adult  Discontinued   Cancer Screenings:  Colorectal Screening:  No longer required.   Mammogram: Completed 11/05/18. Repeat every year;   Bone Density: Completed several years ago per patient. Results reflect unavailable. Repeat every 2 years. Ordered today. Pt provided with contact information and advised to call to schedule appt.   Lung Cancer Screening: (Low Dose CT Chest recommended if Age 69-80 years, 30 pack-year currently smoking OR have quit w/in 15years.) does not qualify.   Additional Screening:  Hepatitis C Screening: no longer required.   Vision Screening: Recommended annual ophthalmology exams for early detection  of glaucoma and other disorders of the eye. Is the  patient up to date with their annual eye exam?  Yes  Who is the provider or what is the name of the office in which the pt attends annual eye exams? [facility] Screening: Recommended annual dental exams for proper oral hygiene  Community Resource Referral:  CRR required this visit?  No      Plan:    I have personally reviewed and addressed the Medicare Annual Wellness questionnaire and have noted the following in the patient's chart:  A. Medical and social history B. Use of alcohol, tobacco or illicit drugs  C. Current medications and supplements D. Functional ability and status E.  Nutritional status F.  Physical activity G. Advance directives H. List of other physicians I.  Hospitalizations, surgeries, and ER visits in previous 12 months J.  [place] such as hearing and vision if needed, cognitive and depression L. Referrals and appointments   In addition, I have reviewed and discussed with patient certain preventive protocols, quality metrics, and best practice recommendations. A written personalized care plan for preventive services as well as general preventive health recommendations were provided to patient.   Signed,  Clemetine Marker, LPN  Nurse Health Advisor   Nurse Notes: none

## 2018-12-31 NOTE — Patient Instructions (Signed)
[name] , Thank you for taking time to come for your Medicare Wellness Visit. I appreciate your ongoing commitment to your health goals. Please review the following plan we discussed and let me know if I can assist you in the future.   Screening recommendations/referrals: Colonoscopy: no longer required Mammogram: done 11/05/18 Bone Density: Please call [phone] to schedule your bone density screening.  Recommended yearly ophthalmology/optometry visit for glaucoma screening and checkup Recommended yearly dental visit for hygiene and checkup  Vaccinations: Influenza vaccine: done 11/23/18  Pneumococcal vaccine: done 11/22/13 Tdap vaccine: done 2009 Shingles vaccine: Shingrix completed 05/17/17    Advanced directives: Advance directive discussed with you today. I have provided a copy for you to complete at home and have notarized. Once this is complete please bring a copy in to our office so we can scan it into your chart.  Conditions/risks identified: Continue to stay active and prevent falls  Next appointment: Please follow up in one year for your Medicare Annual Wellness visit.     Preventive Care 83 Years and Older, Female Preventive care refers to lifestyle choices and visits with your health care provider that can promote health and wellness. What does preventive care include?  A yearly physical exam. This is also called an annual well check.  Dental exams once or twice a year.  Routine eye exams. Ask your health care provider how often you should have your eyes checked.  Personal lifestyle choices, including:  Daily care of your teeth and gums.  Regular physical activity.  Eating a healthy diet.  Avoiding tobacco and drug use.  Limiting alcohol use.  Practicing safe sex.  Taking low-dose aspirin every day.  Taking vitamin and mineral supplements as recommended by your health care provider. What happens during an annual well check? The services and screenings  done by your health care provider during your annual well check will depend on your age, overall health, lifestyle risk factors, and family history of disease. Counseling  Your health care provider may ask you questions about your:  Alcohol use.  Tobacco use.  Drug use.  Emotional well-being.  Home and relationship well-being.  Sexual activity.  Eating habits.  History of falls.  Memory and ability to understand (cognition).  Work and work Statistician.  Reproductive health. Screening  You may have the following tests or measurements:  Height, weight, and BMI.  Blood pressure.  Lipid and cholesterol levels. These may be checked every 5 years, or more frequently if you are over 50 years old.  Skin check.  Lung cancer screening. You may have this screening every year starting at age 34 if you have a 30-pack-year history of smoking and currently smoke or have quit within the past 15 years.  Fecal occult blood test (FOBT) of the stool. You may have this test every year starting at age 42.  Flexible sigmoidoscopy or colonoscopy. You may have a sigmoidoscopy every 5 years or a colonoscopy every 10 years starting at age 22.  Hepatitis C blood test.  Hepatitis B blood test.  Sexually transmitted disease (STD) testing.  Diabetes screening. This is done by checking your blood sugar (glucose) after you have not eaten for a while (fasting). You may have this done every 1-3 years.  Bone density scan. This is done to screen for osteoporosis. You may have this done starting at age 38.  Mammogram. This may be done every 1-2 years. Talk to your health care provider about how often you should have regular  mammograms. Talk with your health care provider about your test results, treatment options, and if necessary, the need for more tests. Vaccines  Your health care provider may recommend certain vaccines, such as:  Influenza vaccine. This is recommended every year.  Tetanus,  diphtheria, and acellular pertussis (Tdap, Td) vaccine. You may need a Td booster every 10 years.  Zoster vaccine. You may need this after age 27.  Pneumococcal 13-valent conjugate (PCV13) vaccine. One dose is recommended after age 30.  Pneumococcal polysaccharide (PPSV23) vaccine. One dose is recommended after age 103. Talk to your health care provider about which screenings and vaccines you need and how often you need them. This information is not intended to replace advice given to you by your health care provider. Make sure you discuss any questions you have with your health care provider. Document Released: 03/20/2015 Document Revised: 11/11/2015 Document Reviewed: 12/23/2014 Elsevier Interactive Patient Education  2017 [place] Prevention in the Home Falls can cause injuries. They can happen to people of all ages. There are many things you can do to make your home safe and to help prevent falls. What can I do on the outside of my home?  Regularly fix the edges of walkways and driveways and fix any cracks.  Remove anything that might make you trip as you walk through a door, such as a raised step or threshold.  Trim any bushes or trees on the path to your home.  Use bright outdoor lighting.  Clear any walking paths of anything that might make someone trip, such as rocks or tools.  Regularly check to see if handrails are loose or broken. Make sure that both sides of any steps have handrails.  Any raised decks and porches should have guardrails on the edges.  Have any leaves, snow, or ice cleared regularly.  Use sand or salt on walking paths during winter.  Clean up any spills in your garage right away. This includes oil or grease spills. What can I do in the bathroom?  Use night lights.  Install grab bars by the toilet and in the tub and shower. Do not use towel bars as grab bars.  Use non-skid mats or decals in the tub or shower.  If you need to sit down in  the shower, use a plastic, non-slip stool.  Keep the floor dry. Clean up any water that spills on the floor as soon as it happens.  Remove soap buildup in the tub or shower regularly.  Attach bath mats securely with double-sided non-slip rug tape.  Do not have throw rugs and other things on the floor that can make you trip. What can I do in the bedroom?  Use night lights.  Make sure that you have a light by your bed that is easy to reach.  Do not use any sheets or blankets that are too big for your bed. They should not hang down onto the floor.  Have a firm chair that has side arms. You can use this for support while you get dressed.  Do not have throw rugs and other things on the floor that can make you trip. What can I do in the kitchen?  Clean up any spills right away.  Avoid walking on wet floors.  Keep items that you use a lot in easy-to-reach places.  If you need to reach something above you, use a strong step stool that has a grab bar.  Keep electrical cords out of the way.  Do not use floor polish or wax that makes floors slippery. If you must use wax, use non-skid floor wax.  Do not have throw rugs and other things on the floor that can make you trip. What can I do with my stairs?  Do not leave any items on the stairs.  Make sure that there are handrails on both sides of the stairs and use them. Fix handrails that are broken or loose. Make sure that handrails are as long as the stairways.  Check any carpeting to make sure that it is firmly attached to the stairs. Fix any carpet that is loose or worn.  Avoid having throw rugs at the top or bottom of the stairs. If you do have throw rugs, attach them to the floor with carpet tape.  Make sure that you have a light switch at the top of the stairs and the bottom of the stairs. If you do not have them, ask someone to add them for you. What else can I do to help prevent falls?  Wear shoes that:  Do not have high  heels.  Have rubber bottoms.  Are comfortable and fit you well.  Are closed at the toe. Do not wear sandals.  If you use a stepladder:  Make sure that it is fully opened. Do not climb a closed stepladder.  Make sure that both sides of the stepladder are locked into place.  Ask someone to hold it for you, if possible.  Clearly mark and make sure that you can see:  Any grab bars or handrails.  First and last steps.  Where the edge of each step is.  Use tools that help you move around (mobility aids) if they are needed. These include:  Canes.  Walkers.  Scooters.  Crutches.  Turn on the lights when you go into a dark area. Replace any light bulbs as soon as they burn out.  Set up your furniture so you have a clear path. Avoid moving your furniture around.  If any of your floors are uneven, fix them.  If there are any pets around you, be aware of where they are.  Review your medicines with your doctor. Some medicines can make you feel dizzy. This can increase your chance of falling. Ask your doctor what other things that you can do to help prevent falls. This information is not intended to replace advice given to you by your health care provider. Make sure you discuss any questions you have with your health care provider. Document Released: 12/18/2008 Document Revised: 07/30/2015 Document Reviewed: 03/28/2014 Elsevier Interactive Patient Education  2017 [employer].

## 2019-01-01 ENCOUNTER — Other Ambulatory Visit: Payer: Self-pay

## 2019-01-01 ENCOUNTER — Ambulatory Visit: Payer: Medicare Other | Admitting: Physical Therapy

## 2019-01-01 DIAGNOSIS — R2681 Unsteadiness on feet: Secondary | ICD-10-CM | POA: Diagnosis not present

## 2019-01-01 DIAGNOSIS — M6281 Muscle weakness (generalized): Secondary | ICD-10-CM

## 2019-01-01 DIAGNOSIS — R262 Difficulty in walking, not elsewhere classified: Secondary | ICD-10-CM

## 2019-01-01 NOTE — Therapy (Signed)
[facility] [address] [place], [place], [address] Phone: [phone]   Fax:  [phone]  Physical Therapy Treatment  Patient Details  Name: [name] MRN: [id number] Date of Birth: 1933/12/14 Referring Provider (PT): Vance Peper MD   Encounter Date: 01/01/2019  PT End of Session - 01/01/19 1313    Visit Number  6    Number of Visits  20    Date for PT Re-Evaluation  01/29/19    PT Start Time  0953    PT Stop Time  1046    PT Time Calculation (min)  53 min    Equipment Utilized During Treatment  Gait belt    Activity Tolerance  Patient tolerated treatment well;Patient limited by fatigue    Behavior During Therapy  [facility] for tasks assessed/performed       Past Medical History:  Diagnosis Date  . Allergy   . Bronchiectasis ([place])   . Cancer [facility])    left breast cancer   . GERD (gastroesophageal reflux disease)   . Hyperlipemia   . Hypertension     Past Surgical History:  Procedure Laterality Date  . BREAST LUMPECTOMY Left 2008  . CHOLECYSTECTOMY    . PARTIAL HYSTERECTOMY      There were no vitals filed for this visit.  Subjective Assessment - 01/01/19 1311    Subjective  Patient states that she continues to notice increased fatigue and discomfort in the thighs, R>L. She reports that she feels therapy is helpful, but she's not sure how much more progress she can make. She feels confident with her ability to commit to an HEP.    Currently in Pain?  No/denies         [facility] PT Assessment - 01/01/19 0001      Dynamic Gait Index   Level Surface  Normal    Change in Gait Speed  Normal    Gait with Horizontal Head Turns  Normal    Gait with Vertical Head Turns  Normal    Gait and Pivot Turn  Mild Impairment    Step Over Obstacle  Normal    Step Around Obstacles  Normal    Steps  Normal    Total Score  23      TREATMENT  Therapeutic Exercise: NuStep, seat 8, L4, x10 min for warm-up and increasing tolerance  to activity and BLE strength Sit to stand from standard height chair, with LUE support, RLE bias, x12 with VCs for body mechanics Sit to stand from elevated surface (1 foam) x12, no UE support with VCs for hip abduction with transfer to prevent genu valgum Sit to stand from standard height chair, with BUE support, x12 with VCs for hip abduction with transfer to prevent genu valgum  Neuromuscular Re-education:  Reassessed DGI and goals (see above/below).  Patient educated on body mechanics for preservation of spine health including but not limited to ADLs (teeth brushing, unloading car/dishwasher, picking up objects from the floor/ground).  Patient able to demonstrate good strategy and ability to maintain flat back for lifting from the ground x3.   Patient educated throughout session on appropriate technique and form using multi-modal cueing, HEP, and activity modification. Patient articulated understanding and returned demonstration.  Patient Response to interventions: Reports fatigue in BLE, but does not indicate pain.  ASSESSMENT Patient presents to clinic with excellent motivation to participate in therapy. Patient demonstrates deficits in BLE strength, posture, and gait. Patient achieved 3 of 4 goals over  the course of the previous 5 visits, but continues to present with difficulty/lack of strength and endurance in BLE during today's session. Patient has demonstrated high level of adherence and commitment to HEP, and is appropriate for a trial period of self-management as she continues to build strength in BLE. Patient will attempt self-management with current HEP and advised progressions for 3 weeks to return to clinic for a goals reassessment at that time. Patient's condition has the potential to improve in response to therapy. Maximum improvement is yet to be obtained. The anticipated improvement is attainable and reasonable in a generally predictable time. Patient will benefit from continued  skilled therapeutic intervention to address remaining deficits in BLE strength, posture, and gait in order to decrease risk of fall, increase function, and improve overall QOL.   PT Long Term Goals - 01/01/19 1003      PT LONG TERM GOAL #1   Title  Patient will be independent with HEP in order to improve strength and balance in order to decrease fall risk and improve function at home and work.    Baseline  IE: provided; 10/27: IND    Time  6    Period  Weeks    Status  Achieved      PT LONG TERM GOAL #2   Title  Patient will improve DGI by at least 3 points in order to demonstrate clinically significant improvement in balance and decreased risk for falls.    Baseline  IE: 20/24; 10/27: 23/24    Time  6    Period  Weeks    Status  Achieved      PT LONG TERM GOAL #3   Title  Patient will decrease 5TSTS by at least 3 seconds without use of UE from a standard height chair in order to demonstrate clinically significant improvement in LE strength.    Baseline  IE: 23.7, BUE use, and counterlever on chair seat; 10/27: 30.3, no UE, no counterlever on chair    Time  6    Period  Weeks    Status  On-going    Target Date  02/12/19      PT LONG TERM GOAL #4   Title  Patient will demonstrate appropriate body mechanics for preservation of bone health with bending and lifting in order to safely participate in community activities (golf).    Baseline  IE: reliant on high level of spine flexion; 10/27: patient showing proper hip hinge and flat back mechanics for picking up objects on floor    Time  6    Period  Weeks    Status  Achieved            Plan - 01/01/19 1314    Clinical Impression Statement  Patient presents to clinic with excellent motivation to participate in therapy. Patient demonstrates deficits in BLE strength, posture, and gait. Patient achieved 3 of 4 goals over the course of the previous 5 visits, but continues to present with difficulty/lack of strength and endurance in  BLE during today's session. Patient has demonstrated high level of adherence and commitment to HEP, and is appropriate for a trial period of self-management as she continues to build strength in BLE. Patient will attempt self-management with current HEP and advised progressions for 3 weeks to return to clinic for a goals reassessment at that time. Patient's condition has the potential to improve in response to therapy. Maximum improvement is yet to be obtained. The anticipated improvement is attainable and reasonable in  a generally predictable time. Patient will benefit from continued skilled therapeutic intervention to address remaining deficits in BLE strength, posture, and gait in order to decrease risk of fall, increase function, and improve overall QOL.    Personal Factors and Comorbidities  Age;Sex;Comorbidity 3+;Past/Current Experience;Time since onset of injury/illness/exacerbation    Comorbidities  HLD, HTN, hx of COVID 19, GERD    Examination-Activity Limitations  Squat;Lift;Stairs;Bend;Locomotion Level;Stand;Carry;Transfers    Examination-Participation Restrictions  Laundry;Shop;Cleaning;Community Activity;Yard Work    [employer]  Evolving/Moderate complexity    Rehab Potential  Good    PT Frequency  1x / week    PT Duration  6 weeks    PT Treatment/Interventions  ADLs/Self Care Home Management;Aquatic Therapy;Cryotherapy;Moist Heat;Stair training;Gait training;DME Instruction;Functional mobility training;Therapeutic activities;Therapeutic exercise;Balance training;Neuromuscular re-education;Patient/family education;Manual techniques;Taping;Passive range of motion;Dry needling;Vestibular;Energy conservation    PT Next Visit Plan  Reassess progress with functional strength    PT Home Exercise Plan  Lateral and anterior stair eccentrics, sit to stand progression, hip strengthening    Consulted and Agree with Plan of Care  Patient       Patient will benefit from  skilled therapeutic intervention in order to improve the following deficits and impairments:  Abnormal gait, Decreased balance, Decreased endurance, Decreased mobility, Difficulty walking, Pain, Decreased strength, Decreased coordination, Decreased range of motion, Postural dysfunction  Visit Diagnosis: Unsteadiness on feet  Muscle weakness (generalized)  Difficulty in walking, not elsewhere classified     Problem List Patient Active Problem List   Diagnosis Date Noted  . COVID-19 virus detected 06/22/2018  . Intractable nausea and vomiting 06/20/2018  . BCC (basal cell carcinoma), face 10/12/2017  . Environmental and seasonal allergies 10/30/2014  . Psoriasis vulgaris 10/30/2014  . Essential (primary) hypertension 10/30/2014  . Gastro-esophageal reflux disease without esophagitis 10/30/2014  . Hot flash, menopausal 10/30/2014  . Hyperlipidemia, mixed 10/30/2014  . History of cancer of right breast 10/30/2014  . Bronchiectasis ([place]) 09/24/2013   Myles Gip PT, DPT [phone]  01/01/2019, 1:31 PM  [place] [facility] [address] [place], [place], [address] Phone: [phone]   Fax:  [phone]  Name: [name] MRN: [id number] Date of Birth: Jan 08, 1934

## 2019-01-07 ENCOUNTER — Other Ambulatory Visit: Payer: Self-pay

## 2019-01-07 ENCOUNTER — Ambulatory Visit (INDEPENDENT_AMBULATORY_CARE_PROVIDER_SITE_OTHER): Payer: Medicare Other | Admitting: Internal Medicine

## 2019-01-07 ENCOUNTER — Encounter: Payer: Self-pay | Admitting: Internal Medicine

## 2019-01-07 VITALS — BP 108/72 | HR 72 | Ht 64.0 in | Wt 128.0 lb

## 2019-01-07 DIAGNOSIS — R29898 Other symptoms and signs involving the musculoskeletal system: Secondary | ICD-10-CM

## 2019-01-07 DIAGNOSIS — I10 Essential (primary) hypertension: Secondary | ICD-10-CM | POA: Diagnosis not present

## 2019-01-07 DIAGNOSIS — Z853 Personal history of malignant neoplasm of breast: Secondary | ICD-10-CM

## 2019-01-07 DIAGNOSIS — Z0000 Encounter for general adult medical examination without abnormal findings: Secondary | ICD-10-CM

## 2019-01-07 DIAGNOSIS — K219 Gastro-esophageal reflux disease without esophagitis: Secondary | ICD-10-CM | POA: Diagnosis not present

## 2019-01-07 DIAGNOSIS — E782 Mixed hyperlipidemia: Secondary | ICD-10-CM

## 2019-01-07 LAB — POCT URINALYSIS DIPSTICK
Bilirubin, UA: NEGATIVE
Blood, UA: NEGATIVE
Glucose, UA: NEGATIVE
Ketones, UA: NEGATIVE
Leukocytes, UA: NEGATIVE
Nitrite, UA: NEGATIVE
Protein, UA: NEGATIVE
Spec Grav, UA: 1.01 (ref 1.010–1.025)
Urobilinogen, UA: 0.2 E.U./dL
pH, UA: 6.5 (ref 5.0–8.0)

## 2019-01-07 NOTE — Progress Notes (Addendum)
Date:  01/07/2019   Name:  [name]   DOB:  07/24/1933   MRN:  [id number]   Chief Complaint: Annual Exam (Already had flu shot this season. ) [name] is a 85 y.o. female who presents today for her Complete Annual Exam. She feels fairly well. She reports exercising playing golf twice a week and walking. She reports she is sleeping fairly well. She is seeing orthopedics about leg weakness.  Currently in PT and doing home exercises.  She thinks she may be gaining some strength.  Mammogram 10/2018 @ [facility] - aged out Immunizations - UTD  Hypertension This is a chronic problem. The problem is controlled (at home 130/78). Pertinent negatives include no chest pain, headaches, palpitations or shortness of breath. Past treatments include beta blockers and diuretics. The current treatment provides significant improvement. There are no compliance problems.   Hyperlipidemia Pertinent negatives include no chest pain or shortness of breath.  Gastroesophageal Reflux She complains of heartburn. She reports no abdominal pain, no chest pain, no choking, no coughing, no dysphagia or no wheezing. This is a recurrent problem. The problem occurs rarely. Pertinent negatives include no fatigue. She has tried a histamine-2 antagonist for the symptoms. The treatment provided moderate relief.  Leg weakness - seen by Ortho, MRI done.  Not taking any medication.  Doing PT and home exercise. MRI:  11/2018 1. Multilevel mild degenerative disc disease without neural impingement. 2. Moderate degenerative disc disease at L2-3 without neural Impingement.  Lab Results  Component Value Date   CHOL 204 (H) 01/04/2018   HDL 91 01/04/2018   LDLCALC 91 01/04/2018   TRIG 104 06/21/2018   CHOLHDL 2.2 01/04/2018   Lab Results  Component Value Date   CREATININE 0.68 07/05/2018   BUN 21 07/05/2018   NA 140 07/05/2018   K 4.0 07/05/2018   CL 101 07/05/2018   CO2 25 07/05/2018     Review of Systems   Constitutional: Negative for chills, fatigue and fever.  HENT: Negative for congestion, hearing loss, tinnitus, trouble swallowing and voice change.   Eyes: Negative for visual disturbance.  Respiratory: Negative for cough, choking, chest tightness, shortness of breath and wheezing.   Cardiovascular: Negative for chest pain, palpitations and leg swelling.  Gastrointestinal: Positive for heartburn. Negative for abdominal pain, constipation, diarrhea, dysphagia and vomiting.  Endocrine: Negative for polydipsia and polyuria.  Genitourinary: Negative for dysuria, frequency, genital sores, vaginal bleeding and vaginal discharge.  Musculoskeletal: Negative for arthralgias, gait problem and joint swelling.  Skin: Negative for color change and rash.  Neurological: Positive for weakness (decreased leg strength). Negative for dizziness, tremors, light-headedness and headaches.  Hematological: Negative for adenopathy. Does not bruise/bleed easily.  Psychiatric/Behavioral: Negative for dysphoric mood and sleep disturbance. The patient is not nervous/anxious.     Patient Active Problem List   Diagnosis Date Noted  . Intractable nausea and vomiting 06/20/2018  . BCC (basal cell carcinoma), face 10/12/2017  . Environmental and seasonal allergies 10/30/2014  . Psoriasis vulgaris 10/30/2014  . Essential (primary) hypertension 10/30/2014  . Gastro-esophageal reflux disease without esophagitis 10/30/2014  . Hot flash, menopausal 10/30/2014  . Hyperlipidemia, mixed 10/30/2014  . History of cancer of right breast 10/30/2014  . Bronchiectasis ([employer]) 09/24/2013    No Known Allergies  Past Surgical History:  Procedure Laterality Date  . BREAST LUMPECTOMY Left 2008  . CHOLECYSTECTOMY    . PARTIAL HYSTERECTOMY      Social History   Tobacco Use  .  Smoking status: Never Smoker  . Smokeless tobacco: Never Used  Substance Use Topics  . Alcohol use: No  . Drug use: No     Medication list has been  reviewed and updated.  Current Meds  Medication Sig  . acetaminophen (TYLENOL) 325 MG tablet Take 2 tablets (650 mg total) by mouth every 6 (six) hours as needed for mild pain or headache (fever >/= 101).  Marland Kitchen acidophilus (RISAQUAD) CAPS capsule Take 1 capsule by mouth daily.  . Ascorbic Acid (VITAMIN C) 100 MG tablet Take 100 mg by mouth daily.  Marland Kitchen aspirin EC 81 MG tablet Take 81 mg by mouth daily.  Marland Kitchen atorvastatin (LIPITOR) 10 MG tablet TAKE 1 TABLET(10 MG) BY MOUTH DAILY (Patient taking differently: Take 10 mg by mouth daily. )  . calcium-vitamin D (CALCIUM 500/D) 500-200 MG-UNIT tablet Take 1 tablet by mouth daily.  . cetirizine (ZYRTEC) 10 MG tablet Take 10 mg by mouth daily.  . famotidine (PEPCID) 20 MG tablet Take 20 mg by mouth 2 (two) times daily. PRN reflux  . fluticasone (FLONASE) 50 MCG/ACT nasal spray SHAKE LIQUID AND USE 2 SPRAYS IN EACH NOSTRIL DAILY (Patient taking differently: Place 2 sprays into both nostrils daily as needed for allergies or rhinitis. )  . folic acid (FOLVITE) 1 MG tablet Take 1 mg by mouth daily as needed (for supplement).   . hydrochlorothiazide (MICROZIDE) 12.5 MG capsule Take 12.5 mg by mouth daily.  . methotrexate (RHEUMATREX) 2.5 MG tablet Take 5 mg by mouth once a week.   . metoprolol tartrate (LOPRESSOR) 25 MG tablet TAKE 1/2 TABLET BY MOUTH ONCE DAILY (Patient taking differently: Take 12.5 mg by mouth daily. )  . Multiple Vitamins-Minerals (MULTIVITAMIN ADULT PO) Take 1 tablet by mouth daily.   . Omega-3 Fatty Acids (FISH OIL) 1000 MG CAPS Take by mouth.  . triamcinolone cream (KENALOG) 0.1 % APP EXT AA BID PRN  . vitamin B-12 (CYANOCOBALAMIN) 1000 MCG tablet Take 1,000 mcg by mouth daily.    PHQ 2/9 Scores 01/07/2019 12/31/2018 08/03/2018 07/05/2018  PHQ - 2 Score 0 0 0 0    BP Readings from Last 3 Encounters:  01/07/19 108/72  12/31/18 131/65  08/03/18 102/68    Physical Exam Vitals signs and nursing note reviewed.  Constitutional:       General: She is not in acute distress.    Appearance: She is well-developed.  HENT:     Head: Normocephalic and atraumatic.     Right Ear: Tympanic membrane and ear canal normal.     Left Ear: Tympanic membrane and ear canal normal.     Nose:     Right Sinus: No maxillary sinus tenderness.     Left Sinus: No maxillary sinus tenderness.  Eyes:     General: No scleral icterus.       Right eye: No discharge.        Left eye: No discharge.     Conjunctiva/sclera: Conjunctivae normal.  Neck:     Musculoskeletal: Normal range of motion. No erythema.     Thyroid: No thyromegaly.     Vascular: No carotid bruit.  Cardiovascular:     Rate and Rhythm: Normal rate and regular rhythm.     Pulses: Normal pulses.     Heart sounds: Normal heart sounds.  Pulmonary:     Effort: Pulmonary effort is normal. No respiratory distress.     Breath sounds: No wheezing.  Chest:     Comments: Breast exam declined Abdominal:  General: Bowel sounds are normal.     Palpations: Abdomen is soft.     Tenderness: There is no abdominal tenderness.  Musculoskeletal: Normal range of motion.  Lymphadenopathy:     Cervical: No cervical adenopathy.  Skin:    General: Skin is warm and dry.     Capillary Refill: Capillary refill takes less than 2 seconds.     Findings: No rash.  Neurological:     General: No focal deficit present.     Mental Status: She is alert and oriented to person, place, and time.     Cranial Nerves: No cranial nerve deficit.     Sensory: No sensory deficit.     Deep Tendon Reflexes: Reflexes are normal and symmetric.  Psychiatric:        Speech: Speech normal.        Behavior: Behavior normal.        Thought Content: Thought content normal.     Wt Readings from Last 3 Encounters:  01/07/19 128 lb (58.1 kg)  12/31/18 125 lb (56.7 kg)  08/03/18 123 lb (55.8 kg)    BP 108/72   Pulse 72   Ht 5\' 4"  (1.626 m)   Wt 128 lb (58.1 kg)   SpO2 97%   BMI 21.97 kg/m   Assessment and  Plan: 1. Annual physical exam Normal exam Pt appears to have recovered fully from Covid-19 - POCT urinalysis dipstick  2. Essential (primary) hypertension Clinically stable exam with well controlled BP.   Tolerating medications, metoprolol and HCTZ, without side effects at this time. Pt to continue current regimen and low sodium diet; benefits of regular exercise as able discussed. - Comprehensive metabolic panel - TSH  3. Gastro-esophageal reflux disease without esophagitis Symptoms fairly well controlled on H2 blocker PRN No red flag signs such as weight loss, n/v, melena Will continue Pepcid - recommend daily use. - CBC with Differential/Platelet  4. Hyperlipidemia, mixed Tolerating high intensity statin medication without side effects at this time Continue same therapy without change at this time. - Lipid panel  5. History of cancer of left breast No evidence of recurrence per recent Mammogram at [facility]  6. Weakness of both lower limbs Will check vitamin D level Continue PT and follow up with Ortho as needed - VITAMIN D 25 Hydroxy (Vit-D Deficiency, Fractures)   Partially dictated using Editor, commissioning. Any errors are unintentional.  Halina Maidens, MD [facility]  01/07/2019

## 2019-01-08 ENCOUNTER — Encounter: Payer: Self-pay | Admitting: Internal Medicine

## 2019-01-08 LAB — CBC WITH DIFFERENTIAL/PLATELET
Basophils Absolute: 0 10*3/uL (ref 0.0–0.2)
Basos: 1 %
EOS (ABSOLUTE): 0.2 10*3/uL (ref 0.0–0.4)
Eos: 2 %
Hematocrit: 41.9 % (ref 34.0–46.6)
Hemoglobin: 14.2 g/dL (ref 11.1–15.9)
Immature Grans (Abs): 0 10*3/uL (ref 0.0–0.1)
Immature Granulocytes: 0 %
Lymphocytes Absolute: 2.1 10*3/uL (ref 0.7–3.1)
Lymphs: 26 %
MCH: 30.4 pg (ref 26.6–33.0)
MCHC: 33.9 g/dL (ref 31.5–35.7)
MCV: 90 fL (ref 79–97)
Monocytes Absolute: 0.7 10*3/uL (ref 0.1–0.9)
Monocytes: 8 %
Neutrophils Absolute: 5 10*3/uL (ref 1.4–7.0)
Neutrophils: 63 %
Platelets: 220 10*3/uL (ref 150–450)
RBC: 4.67 x10E6/uL (ref 3.77–5.28)
RDW: 13.2 % (ref 11.7–15.4)
WBC: 8 10*3/uL (ref 3.4–10.8)

## 2019-01-08 LAB — COMPREHENSIVE METABOLIC PANEL
ALT: 14 IU/L (ref 0–32)
AST: 21 IU/L (ref 0–40)
Albumin/Globulin Ratio: 1.6 (ref 1.2–2.2)
Albumin: 4.6 g/dL (ref 3.6–4.6)
Alkaline Phosphatase: 86 IU/L (ref 39–117)
BUN/Creatinine Ratio: 23 (ref 12–28)
BUN: 20 mg/dL (ref 8–27)
Bilirubin Total: 0.4 mg/dL (ref 0.0–1.2)
CO2: 22 mmol/L (ref 20–29)
Calcium: 9.5 mg/dL (ref 8.7–10.3)
Chloride: 104 mmol/L (ref 96–106)
Creatinine, Ser: 0.87 mg/dL (ref 0.57–1.00)
GFR calc Af Amer: 71 mL/min/{1.73_m2} (ref >59)
GFR calc non Af Amer: 62 mL/min/{1.73_m2} (ref >59)
Globulin, Total: 2.9 g/dL (ref 1.5–4.5)
Glucose: 81 mg/dL (ref 65–99)
Potassium: 4.4 mmol/L (ref 3.5–5.2)
Sodium: 141 mmol/L (ref 134–144)
Total Protein: 7.5 g/dL (ref 6.0–8.5)

## 2019-01-08 LAB — VITAMIN D 25 HYDROXY (VIT D DEFICIENCY, FRACTURES): Vit D, 25-Hydroxy: 40.7 ng/mL (ref 30.0–100.0)

## 2019-01-08 LAB — LIPID PANEL
Chol/HDL Ratio: 1.9 ratio (ref 0.0–4.4)
Cholesterol, Total: 195 mg/dL (ref 100–199)
HDL: 101 mg/dL (ref >39)
LDL Chol Calc (NIH): 78 mg/dL (ref 0–99)
Triglycerides: 89 mg/dL (ref 0–149)
VLDL Cholesterol Cal: 16 mg/dL (ref 5–40)

## 2019-01-08 LAB — TSH: TSH: 3.3 u[IU]/mL (ref 0.450–4.500)

## 2019-01-22 ENCOUNTER — Other Ambulatory Visit: Payer: Self-pay

## 2019-01-22 ENCOUNTER — Encounter: Payer: Self-pay | Admitting: Physical Therapy

## 2019-01-22 ENCOUNTER — Ambulatory Visit: Payer: Medicare Other | Attending: Physician Assistant | Admitting: Physical Therapy

## 2019-01-22 DIAGNOSIS — M6281 Muscle weakness (generalized): Secondary | ICD-10-CM | POA: Diagnosis present

## 2019-01-22 DIAGNOSIS — R262 Difficulty in walking, not elsewhere classified: Secondary | ICD-10-CM | POA: Diagnosis present

## 2019-01-22 DIAGNOSIS — R2681 Unsteadiness on feet: Secondary | ICD-10-CM | POA: Diagnosis present

## 2019-01-22 NOTE — Therapy (Signed)
[facility] [address]. [place], [place], [address] Phone: [phone]   Fax:  [phone]  Physical Therapy Treatment  Patient Details  Name: [name] MRN: [id number] Date of Birth: 03-Mar-1934 Referring Provider (PT): Vance Peper MD   Encounter Date: 01/22/2019  PT End of Session - 01/22/19 1013    Visit Number  7    Number of Visits  24    Date for PT Re-Evaluation  02/19/19    PT Start Time  0955    PT Stop Time  1040    PT Time Calculation (min)  45 min    Equipment Utilized During Treatment  Gait belt    Activity Tolerance  Patient tolerated treatment well;Patient limited by fatigue    Behavior During Therapy  [facility] for tasks assessed/performed       Past Medical History:  Diagnosis Date  . Allergy   . Bronchiectasis ([place])   . Cancer [facility])    left breast cancer   . GERD (gastroesophageal reflux disease)   . Hyperlipemia   . Hypertension   . Intractable nausea and vomiting 06/20/2018    Past Surgical History:  Procedure Laterality Date  . BREAST LUMPECTOMY Left 2008  . CHOLECYSTECTOMY    . PARTIAL HYSTERECTOMY      There were no vitals filed for this visit.  Subjective Assessment - 01/22/19 1008    Subjective  Patient reports that she is frustrated with her progress. She feels that despite doing her HEP with good frequency, she is feeling increased weakness in her RLE. She is concerned with her progress. She also occasionally feels like her R hip is going to give out. She has increased pain and weakness with navigating inclines.    Currently in Pain?  No/denies         [facility] PT Assessment - 01/22/19 0001      Dynamic Gait Index   Level Surface  Normal    Change in Gait Speed  Mild Impairment    Gait with Horizontal Head Turns  Normal    Gait with Vertical Head Turns  Mild Impairment    Gait and Pivot Turn  Mild Impairment    Step Over Obstacle  Mild Impairment    Step Around Obstacles  Normal     Steps  Mild Impairment    Total Score  19       TREATMENT  Therapeutic Exercise: NuStep, seat 8, L4, x5 min for warm-up and increasing tolerance to activity and BLE strength Sit to stand from standard height chair, x5  Neuromuscular Re-education:  Reassessed DGI and goals (see above/below).  Body mechanics with stair climbing, BUE/SUE support, SBA, VCs for control with notable fasciculations on descent. Patient education on graded activity and concerns regarding progressive weakness and fatigue.   Patient educated throughout session on appropriate technique and form using multi-modal cueing, HEP, and activity modification. Patient articulated understanding and returned demonstration.  Patient Response to interventions: Reports fatigue in BLE and agrees to reach out to referring provider regarding progressive weakness onset.   ASSESSMENT Patient presents to clinic with decreased motivation to participate in therapy. Patient demonstrates deficits in BLE strength, posture, and gait. Upon reassessing patient's goals 2/2 to her report of increased weakness, previous progress made has not been demonstrated during today's session. Despite report of high adherence to HEP (corroborated by spouse), patient has had a significant decrease in 5 Times Sit to Stand (use of BUE and ~ 10  second increase in time) and a 4 point decrease in DGI moving her into an increased falls risk category. Based on patient's performance on functional outcome measures, apparent adverse reaction to strengthening HEP, and reports of increased weakness and fatigue with no clear orthopedic/mechanical origin, patient may benefit further medical investigation. Patient may benefit from continued skilled therapeutic intervention to address remaining deficits in BLE strength, posture, and gait in order to decrease risk of fall, increase function, and improve overall QOL with other origins for onset of weakness ruled out.     PT Long  Term Goals - 01/22/19 1014      PT LONG TERM GOAL #1   Title  Patient will be independent with HEP in order to improve strength and balance in order to decrease fall risk and improve function at home and work.    Baseline  IE: provided; 10/27: IND    Time  4    Period  Weeks    Status  Achieved    Target Date  02/19/19      PT LONG TERM GOAL #2   Title  Patient will improve DGI by at least 3 points in order to demonstrate clinically significant improvement in balance and decreased risk for falls.    Baseline  IE: 20/24; 10/27: 23/24; 01/22/2019: 19/24    Time  4    Period  Weeks    Status  Not Met    Target Date  02/19/19      PT LONG TERM GOAL #3   Title  Patient will decrease 5TSTS by at least 3 seconds without use of UE from a standard height chair in order to demonstrate clinically significant improvement in LE strength.    Baseline  IE: 23.7, BUE use, and counterlever on chair seat; 10/27: 30.3, no UE, no counterlever on chair; 11/17: 33.1 sec, BUE use    Time  4    Period  Weeks    Status  Not Met    Target Date  02/19/19      PT LONG TERM GOAL #4   Title  Patient will demonstrate appropriate body mechanics for preservation of bone health with bending and lifting in order to safely participate in community activities (golf).    Baseline  IE: reliant on high level of spine flexion; 10/27: patient showing proper hip hinge and flat back mechanics for picking up objects on floor    Time  4    Period  Weeks    Status  Achieved    Target Date  02/19/19            Plan - 01/22/19 1013    Clinical Impression Statement  Patient presents to clinic with decreased motivation to participate in therapy. Patient demonstrates deficits in BLE strength, posture, and gait. Upon reassessing patient's goals 2/2 to her report of increased weakness, previous progress made has not been demonstrated during today's session. Despite report of high adherence to HEP (corroborated by spouse),  patient has had a significant decrease in 5 Times Sit to Stand (use of BUE and ~ 10 second increase in time) and a 4 point decrease in DGI moving her into an increased falls risk category. Based on patient's performance on functional outcome measures, apparent adverse reaction to strengthening HEP, and reports of increased weakness and fatigue with no clear orthopedic/mechanical origin, patient may benefit further medical investigation. Patient may benefit from continued skilled therapeutic intervention to address remaining deficits in BLE strength, posture, and gait in  order to decrease risk of fall, increase function, and improve overall QOL with other origins for onset of weakness ruled out.    Personal Factors and Comorbidities  Age;Sex;Comorbidity 3+;Past/Current Experience;Time since onset of injury/illness/exacerbation    Comorbidities  HLD, HTN, hx of COVID 19, GERD    Examination-Activity Limitations  Squat;Lift;Stairs;Bend;Locomotion Level;Stand;Carry;Transfers    Examination-Participation Restrictions  Laundry;Shop;Cleaning;Community Activity;Yard Work    [employer]  Evolving/Moderate complexity    Rehab Potential  Good    PT Frequency  1x / week    PT Duration  4 weeks    PT Treatment/Interventions  ADLs/Self Care Home Management;Aquatic Therapy;Cryotherapy;Moist Heat;Stair training;Gait training;DME Instruction;Functional mobility training;Therapeutic activities;Therapeutic exercise;Balance training;Neuromuscular re-education;Patient/family education;Manual techniques;Taping;Passive range of motion;Dry needling;Vestibular;Energy conservation    PT Next Visit Plan  Reassess/progress with functional strength and balance    PT Home Exercise Plan  Lateral and anterior stair eccentrics, sit to stand progression, hip strengthening    Recommended Other Services  Possible neurology referral 2/2 to progressive weakness in response to strengthening and precipitous loss of  strength over 3 weeks.    Consulted and Agree with Plan of Care  Patient       Patient will benefit from skilled therapeutic intervention in order to improve the following deficits and impairments:  Abnormal gait, Decreased balance, Decreased endurance, Decreased mobility, Difficulty walking, Pain, Decreased strength, Decreased coordination, Decreased range of motion, Postural dysfunction  Visit Diagnosis: Unsteadiness on feet  Muscle weakness (generalized)  Difficulty in walking, not elsewhere classified     Problem List Patient Active Problem List   Diagnosis Date Noted  . BCC (basal cell carcinoma), face 10/12/2017  . Environmental and seasonal allergies 10/30/2014  . Psoriasis vulgaris 10/30/2014  . Essential (primary) hypertension 10/30/2014  . Gastro-esophageal reflux disease without esophagitis 10/30/2014  . Hot flash, menopausal 10/30/2014  . Hyperlipidemia, mixed 10/30/2014  . History of cancer of left breast 10/30/2014  . Bronchiectasis [facility]) 09/24/2013   Myles Gip PT, DPT [phone] 01/22/2019, 12:36 PM  [place] [facility] [address] [place], [place], [address] Phone: [phone]   Fax:  [phone]  Name: JORGE AMPARO MRN: [id number] Date of Birth: [birth year]

## 2019-02-10 ENCOUNTER — Other Ambulatory Visit: Payer: Self-pay | Admitting: Internal Medicine

## 2019-03-26 DIAGNOSIS — M6281 Muscle weakness (generalized): Secondary | ICD-10-CM | POA: Diagnosis not present

## 2019-03-26 DIAGNOSIS — Z8616 Personal history of COVID-19: Secondary | ICD-10-CM | POA: Diagnosis not present

## 2019-03-26 DIAGNOSIS — J432 Centrilobular emphysema: Secondary | ICD-10-CM | POA: Diagnosis not present

## 2019-03-26 DIAGNOSIS — J479 Bronchiectasis, uncomplicated: Secondary | ICD-10-CM | POA: Diagnosis not present

## 2019-03-28 ENCOUNTER — Other Ambulatory Visit: Payer: Self-pay | Admitting: Internal Medicine

## 2019-04-29 DIAGNOSIS — M5416 Radiculopathy, lumbar region: Secondary | ICD-10-CM | POA: Diagnosis not present

## 2019-05-14 ENCOUNTER — Other Ambulatory Visit: Payer: Self-pay | Admitting: Internal Medicine

## 2019-05-14 DIAGNOSIS — J3089 Other allergic rhinitis: Secondary | ICD-10-CM

## 2019-05-24 ENCOUNTER — Other Ambulatory Visit: Payer: Self-pay

## 2019-05-24 ENCOUNTER — Encounter: Payer: Self-pay | Admitting: Internal Medicine

## 2019-05-24 ENCOUNTER — Ambulatory Visit: Payer: Medicare PPO | Admitting: Internal Medicine

## 2019-05-24 VITALS — BP 126/70 | HR 92 | Temp 97.8°F | Ht 64.0 in | Wt 128.0 lb

## 2019-05-24 DIAGNOSIS — J479 Bronchiectasis, uncomplicated: Secondary | ICD-10-CM

## 2019-05-24 DIAGNOSIS — N3001 Acute cystitis with hematuria: Secondary | ICD-10-CM

## 2019-05-24 LAB — POC URINALYSIS WITH MICROSCOPIC (NON AUTO)MANUAL RESULT
Bilirubin, UA: NEGATIVE
Blood, UA: NEGATIVE
Crystals: 0
Glucose, UA: NEGATIVE
Ketones, UA: NEGATIVE
Leukocytes, UA: NEGATIVE
Mucus, UA: 0
Nitrite, UA: NEGATIVE
Protein, UA: NEGATIVE
RBC: 0 M/uL — AB (ref 4.04–5.48)
Spec Grav, UA: 1.02 (ref 1.010–1.025)
Urobilinogen, UA: 0.2 E.U./dL
WBC Casts, UA: 5
pH, UA: 6 (ref 5.0–8.0)

## 2019-05-24 MED ORDER — CIPROFLOXACIN HCL 250 MG PO TABS: 250.0000 mg | ORAL_TABLET | Freq: Two times a day (BID) | ORAL | 0 refills | Status: AC

## 2019-05-24 NOTE — Progress Notes (Signed)
Date:  05/24/2019   Name:  [name]   DOB:  1934/02/26   MRN:  [id number]   Chief Complaint: Urinary Tract Infection (Burning while urinating. Started a few days ago. Better today but still not gone. )  Urinary Tract Infection  This is a new problem. The current episode started in the past 7 days. The problem has been unchanged. The quality of the pain is described as burning. The pain is mild. There has been no fever. Pertinent negatives include no chills, frequency, hematuria or urgency.    Lab Results  Component Value Date   CREATININE 0.87 01/07/2019   BUN 20 01/07/2019   NA 141 01/07/2019   K 4.4 01/07/2019   CL 104 01/07/2019   CO2 22 01/07/2019   Lab Results  Component Value Date   CHOL 195 01/07/2019   HDL 101 01/07/2019   LDLCALC 78 01/07/2019   TRIG 89 01/07/2019   CHOLHDL 1.9 01/07/2019   Lab Results  Component Value Date   TSH 3.300 01/07/2019   No results found for: HGBA1C Lab Results  Component Value Date   WBC 8.0 01/07/2019   HGB 14.2 01/07/2019   HCT 41.9 01/07/2019   MCV 90 01/07/2019   PLT 220 01/07/2019   Lab Results  Component Value Date   ALT 14 01/07/2019   AST 21 01/07/2019   ALKPHOS 86 01/07/2019   BILITOT 0.4 01/07/2019     Review of Systems  Constitutional: Negative for chills, fatigue and fever.  HENT: Negative for trouble swallowing.   Respiratory: Positive for cough. Negative for chest tightness, shortness of breath and wheezing.   Cardiovascular: Negative for chest pain.  Genitourinary: Positive for dysuria. Negative for frequency, hematuria and urgency.  Musculoskeletal: Positive for myalgias (since Covid illness).  Neurological: Negative for dizziness and headaches.    Patient Active Problem List   Diagnosis Date Noted  . BCC (basal cell carcinoma), face 10/12/2017  . Environmental and seasonal allergies 10/30/2014  . Psoriasis vulgaris 10/30/2014  . Essential (primary) hypertension 10/30/2014  .  Gastro-esophageal reflux disease without esophagitis 10/30/2014  . Hot flash, menopausal 10/30/2014  . Hyperlipidemia, mixed 10/30/2014  . History of cancer of left breast 10/30/2014  . Bronchiectasis ([place]) 09/24/2013    No Known Allergies  Past Surgical History:  Procedure Laterality Date  . BREAST LUMPECTOMY Left 2008  . CHOLECYSTECTOMY    . PARTIAL HYSTERECTOMY      Social History   Tobacco Use  . Smoking status: Never Smoker  . Smokeless tobacco: Never Used  Substance Use Topics  . Alcohol use: No  . Drug use: No     Medication list has been reviewed and updated.  Current Meds  Medication Sig  . acetaminophen (TYLENOL) 325 MG tablet Take 2 tablets (650 mg total) by mouth every 6 (six) hours as needed for mild pain or headache (fever >/= 101).  Marland Kitchen acidophilus (RISAQUAD) CAPS capsule Take 1 capsule by mouth daily.  . Ascorbic Acid (VITAMIN C) 100 MG tablet Take 100 mg by mouth daily.  Marland Kitchen aspirin EC 81 MG tablet Take 81 mg by mouth daily.  Marland Kitchen atorvastatin (LIPITOR) 10 MG tablet TAKE 1 TABLET(10 MG) BY MOUTH DAILY  . calcium-vitamin D (CALCIUM 500/D) 500-200 MG-UNIT tablet Take 1 tablet by mouth daily.  . cetirizine (ZYRTEC) 10 MG tablet Take 10 mg by mouth daily.  . famotidine (PEPCID) 20 MG tablet Take 20 mg by mouth 2 (two) times daily. PRN reflux  .  fluticasone (FLONASE) 50 MCG/ACT nasal spray Place 2 sprays into both nostrils daily as needed for allergies or rhinitis.  . folic acid (FOLVITE) 1 MG tablet Take 1 mg by mouth daily as needed (for supplement).   . hydrochlorothiazide (MICROZIDE) 12.5 MG capsule TAKE 1 CAPSULE(12.5 MG) BY MOUTH DAILY  . methotrexate (RHEUMATREX) 2.5 MG tablet Take 5 mg by mouth once a week.   . metoprolol tartrate (LOPRESSOR) 25 MG tablet TAKE 1/2 TABLET BY MOUTH ONCE DAILY (Patient taking differently: Take 12.5 mg by mouth daily. )  . Multiple Vitamins-Minerals (MULTIVITAMIN ADULT PO) Take 1 tablet by mouth daily.   . Omega-3 Fatty Acids  (FISH OIL) 1000 MG CAPS Take by mouth.  . triamcinolone cream (KENALOG) 0.1 % APP EXT AA BID PRN  . vitamin B-12 (CYANOCOBALAMIN) 1000 MCG tablet Take 1,000 mcg by mouth daily.    PHQ 2/9 Scores 05/24/2019 01/07/2019 12/31/2018 08/03/2018  PHQ - 2 Score 0 0 0 0  PHQ- 9 Score 0 - - -    BP Readings from Last 3 Encounters:  05/24/19 126/70  01/07/19 108/72  12/31/18 131/65    Physical Exam Vitals and nursing note reviewed.  Constitutional:      Appearance: She is well-developed.  Cardiovascular:     Rate and Rhythm: Normal rate and regular rhythm.     Heart sounds: Normal heart sounds.  Pulmonary:     Effort: Pulmonary effort is normal. No respiratory distress.     Breath sounds: Normal breath sounds.  Abdominal:     General: Bowel sounds are normal.     Palpations: Abdomen is soft.     Tenderness: There is abdominal tenderness in the suprapubic area. There is no right CVA tenderness, left CVA tenderness, guarding or rebound.     Wt Readings from Last 3 Encounters:  05/24/19 128 lb (58.1 kg)  01/07/19 128 lb (58.1 kg)  12/31/18 125 lb (56.7 kg)    BP 126/70   Pulse 92   Temp 97.8 F (36.6 C) (Temporal)   Ht 5\' 4"  (1.626 m)   Wt 128 lb (58.1 kg)   SpO2 96%   BMI 21.97 kg/m   Assessment and Plan: 1. Acute cystitis with hematuria Continue fluids, AZO if helpful, cranberry juice - ciprofloxacin (CIPRO) 250 MG tablet; Take 1 tablet (250 mg total) by mouth 2 (two) times daily for 5 days.  Dispense: 10 tablet; Refill: 0 - POC urinalysis w microscopic (non auto)  2. Bronchiectasis without complication ([place]) Followed by Pulmonary No change in therapy.  Respiratory symptoms are stable Checked labs for Polymyositis since she still has muscle pains since Covid -these were normal.   Partially dictated using Editor, commissioning. Any errors are unintentional.  Halina Maidens, MD [facility]  05/24/2019

## 2019-06-18 DIAGNOSIS — R29898 Other symptoms and signs involving the musculoskeletal system: Secondary | ICD-10-CM | POA: Diagnosis not present

## 2019-06-18 DIAGNOSIS — M7061 Trochanteric bursitis, right hip: Secondary | ICD-10-CM | POA: Diagnosis not present

## 2019-06-26 ENCOUNTER — Other Ambulatory Visit: Payer: Self-pay | Admitting: Internal Medicine

## 2019-06-27 DIAGNOSIS — M25551 Pain in right hip: Secondary | ICD-10-CM | POA: Diagnosis not present

## 2019-06-27 DIAGNOSIS — M5136 Other intervertebral disc degeneration, lumbar region: Secondary | ICD-10-CM | POA: Diagnosis not present

## 2019-06-27 DIAGNOSIS — M7061 Trochanteric bursitis, right hip: Secondary | ICD-10-CM | POA: Diagnosis not present

## 2019-06-27 DIAGNOSIS — G8929 Other chronic pain: Secondary | ICD-10-CM | POA: Diagnosis not present

## 2019-06-27 DIAGNOSIS — M5416 Radiculopathy, lumbar region: Secondary | ICD-10-CM | POA: Diagnosis not present

## 2019-07-05 DIAGNOSIS — R29898 Other symptoms and signs involving the musculoskeletal system: Secondary | ICD-10-CM | POA: Diagnosis not present

## 2019-07-12 DIAGNOSIS — M5136 Other intervertebral disc degeneration, lumbar region: Secondary | ICD-10-CM | POA: Diagnosis not present

## 2019-07-12 DIAGNOSIS — M5416 Radiculopathy, lumbar region: Secondary | ICD-10-CM | POA: Diagnosis not present

## 2019-07-12 DIAGNOSIS — M7061 Trochanteric bursitis, right hip: Secondary | ICD-10-CM | POA: Diagnosis not present

## 2019-07-16 DIAGNOSIS — G8929 Other chronic pain: Secondary | ICD-10-CM | POA: Diagnosis not present

## 2019-07-16 DIAGNOSIS — M7061 Trochanteric bursitis, right hip: Secondary | ICD-10-CM | POA: Diagnosis not present

## 2019-07-16 DIAGNOSIS — M25551 Pain in right hip: Secondary | ICD-10-CM | POA: Diagnosis not present

## 2019-07-16 DIAGNOSIS — M76891 Other specified enthesopathies of right lower limb, excluding foot: Secondary | ICD-10-CM | POA: Diagnosis not present

## 2019-07-16 DIAGNOSIS — M5136 Other intervertebral disc degeneration, lumbar region: Secondary | ICD-10-CM | POA: Diagnosis not present

## 2019-07-30 DIAGNOSIS — L400 Psoriasis vulgaris: Secondary | ICD-10-CM | POA: Diagnosis not present

## 2019-07-30 DIAGNOSIS — Z79899 Other long term (current) drug therapy: Secondary | ICD-10-CM | POA: Diagnosis not present

## 2019-08-16 DIAGNOSIS — M7061 Trochanteric bursitis, right hip: Secondary | ICD-10-CM | POA: Diagnosis not present

## 2019-08-16 DIAGNOSIS — M5136 Other intervertebral disc degeneration, lumbar region: Secondary | ICD-10-CM | POA: Diagnosis not present

## 2019-08-16 DIAGNOSIS — M76891 Other specified enthesopathies of right lower limb, excluding foot: Secondary | ICD-10-CM | POA: Diagnosis not present

## 2019-08-16 DIAGNOSIS — G8929 Other chronic pain: Secondary | ICD-10-CM | POA: Diagnosis not present

## 2019-08-16 DIAGNOSIS — M25551 Pain in right hip: Secondary | ICD-10-CM | POA: Diagnosis not present

## 2019-08-22 DIAGNOSIS — J439 Emphysema, unspecified: Secondary | ICD-10-CM | POA: Diagnosis not present

## 2019-08-22 DIAGNOSIS — J479 Bronchiectasis, uncomplicated: Secondary | ICD-10-CM | POA: Diagnosis not present

## 2019-08-22 DIAGNOSIS — R0600 Dyspnea, unspecified: Secondary | ICD-10-CM | POA: Diagnosis not present

## 2019-10-17 DIAGNOSIS — H40019 Open angle with borderline findings, low risk, unspecified eye: Secondary | ICD-10-CM | POA: Diagnosis not present

## 2019-10-17 DIAGNOSIS — H401131 Primary open-angle glaucoma, bilateral, mild stage: Secondary | ICD-10-CM | POA: Diagnosis not present

## 2019-10-17 DIAGNOSIS — H2513 Age-related nuclear cataract, bilateral: Secondary | ICD-10-CM | POA: Diagnosis not present

## 2019-10-22 ENCOUNTER — Telehealth: Payer: Self-pay | Admitting: Internal Medicine

## 2019-10-22 NOTE — Telephone Encounter (Signed)
Copied from [place] [id number]. Topic: Referral - Request for Referral >> Oct 22, 2019 10:39 AM Sheran Luz wrote: Has patient seen PCP for this complaint? Yes *If NO, is insurance requiring patient see PCP for this issue before PCP can refer them? Referral for which specialty: Diagnostic imaging  Preferred provider/office: [facility]  Reason for referral: Mammogram

## 2019-10-22 NOTE — Telephone Encounter (Signed)
Called pt told her that her RF was placed. Pt verbalized understanding.  KP

## 2019-11-12 DIAGNOSIS — Z1231 Encounter for screening mammogram for malignant neoplasm of breast: Secondary | ICD-10-CM | POA: Diagnosis not present

## 2019-11-21 DIAGNOSIS — Z23 Encounter for immunization: Secondary | ICD-10-CM | POA: Diagnosis not present

## 2019-12-23 DIAGNOSIS — M25551 Pain in right hip: Secondary | ICD-10-CM | POA: Diagnosis not present

## 2019-12-23 DIAGNOSIS — G8929 Other chronic pain: Secondary | ICD-10-CM | POA: Diagnosis not present

## 2019-12-23 DIAGNOSIS — M7061 Trochanteric bursitis, right hip: Secondary | ICD-10-CM | POA: Diagnosis not present

## 2019-12-23 DIAGNOSIS — M76891 Other specified enthesopathies of right lower limb, excluding foot: Secondary | ICD-10-CM | POA: Diagnosis not present

## 2020-01-01 ENCOUNTER — Ambulatory Visit: Payer: Medicare Other

## 2020-01-08 ENCOUNTER — Other Ambulatory Visit: Payer: Self-pay

## 2020-01-08 ENCOUNTER — Other Ambulatory Visit: Payer: Self-pay | Admitting: Internal Medicine

## 2020-01-09 ENCOUNTER — Encounter: Payer: Self-pay | Admitting: Internal Medicine

## 2020-01-09 ENCOUNTER — Ambulatory Visit (INDEPENDENT_AMBULATORY_CARE_PROVIDER_SITE_OTHER): Payer: Medicare PPO | Admitting: Internal Medicine

## 2020-01-09 ENCOUNTER — Other Ambulatory Visit: Payer: Self-pay

## 2020-01-09 VITALS — BP 125/59 | HR 74 | Temp 97.6°F | Ht 64.0 in | Wt 127.0 lb

## 2020-01-09 DIAGNOSIS — I10 Essential (primary) hypertension: Secondary | ICD-10-CM

## 2020-01-09 DIAGNOSIS — E782 Mixed hyperlipidemia: Secondary | ICD-10-CM | POA: Diagnosis not present

## 2020-01-09 DIAGNOSIS — Z853 Personal history of malignant neoplasm of breast: Secondary | ICD-10-CM

## 2020-01-09 DIAGNOSIS — J479 Bronchiectasis, uncomplicated: Secondary | ICD-10-CM | POA: Diagnosis not present

## 2020-01-09 DIAGNOSIS — J449 Chronic obstructive pulmonary disease, unspecified: Secondary | ICD-10-CM | POA: Diagnosis not present

## 2020-01-09 DIAGNOSIS — K219 Gastro-esophageal reflux disease without esophagitis: Secondary | ICD-10-CM | POA: Diagnosis not present

## 2020-01-09 DIAGNOSIS — Z0000 Encounter for general adult medical examination without abnormal findings: Secondary | ICD-10-CM

## 2020-01-09 DIAGNOSIS — B009 Herpesviral infection, unspecified: Secondary | ICD-10-CM | POA: Diagnosis not present

## 2020-01-09 DIAGNOSIS — I700 Atherosclerosis of aorta: Secondary | ICD-10-CM | POA: Diagnosis not present

## 2020-01-09 LAB — POCT URINALYSIS DIPSTICK
Bilirubin, UA: NEGATIVE
Blood, UA: NEGATIVE
Glucose, UA: NEGATIVE
Ketones, UA: NEGATIVE
Nitrite, UA: NEGATIVE
Protein, UA: NEGATIVE
Spec Grav, UA: 1.02 (ref 1.010–1.025)
Urobilinogen, UA: 0.2 E.U./dL
pH, UA: 5 (ref 5.0–8.0)

## 2020-01-09 MED ORDER — ACYCLOVIR 5 % EX OINT: 1.0000 "application " | TOPICAL_OINTMENT | CUTANEOUS | 1 refills | Status: DC

## 2020-01-09 NOTE — Progress Notes (Signed)
Date:  01/09/2020   Name:  [name]   DOB:  [birth year]   MRN:  [id number]   Chief Complaint: Annual Exam (Breast Exam, No pap- discontinued. )  [name] is a 86 y.o. female who presents today for her Complete Annual Exam. She feels fairly well. She reports exercising walking and playing golf. She reports she is sleeping fairly well. Breast complaints are none.  Mammogram: 11/2019 DEXA: 2014 Colonoscopy: aged out  Immunization History  Administered Date(s) Administered  . Influenza Inj Mdck Quad Pf 12/15/2017  . Influenza,inj,Quad PF,6+ Mos 11/25/2014, 11/21/2019  . Influenza-Unspecified 12/06/2015, 11/25/2016, 11/23/2018  . PFIZER SARS-COV-2 Vaccination 05/06/2019, 05/26/2019, 12/04/2019  . Pneumococcal Conjugate-13 11/22/2013  . Pneumococcal Polysaccharide-23 03/08/2000  . Tdap 07/06/2007  . Zoster 03/08/2005  . Zoster Recombinat (Shingrix) 01/06/2017, 05/17/2017    Hypertension This is a chronic problem. The problem is controlled. Pertinent negatives include no chest pain, headaches, palpitations or shortness of breath. Past treatments include beta blockers and diuretics. The current treatment provides significant improvement.  Hyperlipidemia This is a chronic problem. The problem is controlled. Pertinent negatives include no chest pain or shortness of breath. Current antihyperlipidemic treatment includes statins.  Gastroesophageal Reflux She reports no abdominal pain, no chest pain, no coughing or no wheezing. This is a recurrent problem. The problem occurs rarely. Pertinent negatives include no fatigue. She has tried a PPI for the symptoms. The treatment provided significant relief.  Rash This is a recurrent problem. The affected locations include the right buttock. The rash is characterized by blistering, burning and itchiness. She was exposed to nothing. Pertinent negatives include no congestion, cough, diarrhea, fatigue, fever, shortness of breath or vomiting.  Treatments tried: zinc oxide. The treatment provided mild relief.    Lab Results  Component Value Date   CREATININE 0.87 01/07/2019   BUN 20 01/07/2019   NA 141 01/07/2019   K 4.4 01/07/2019   CL 104 01/07/2019   CO2 22 01/07/2019   Lab Results  Component Value Date   CHOL 195 01/07/2019   HDL 101 01/07/2019   LDLCALC 78 01/07/2019   TRIG 89 01/07/2019   CHOLHDL 1.9 01/07/2019   Lab Results  Component Value Date   TSH 3.300 01/07/2019   No results found for: HGBA1C Lab Results  Component Value Date   WBC 8.0 01/07/2019   HGB 14.2 01/07/2019   HCT 41.9 01/07/2019   MCV 90 01/07/2019   PLT 220 01/07/2019   Lab Results  Component Value Date   ALT 14 01/07/2019   AST 21 01/07/2019   ALKPHOS 86 01/07/2019   BILITOT 0.4 01/07/2019     Review of Systems  Constitutional: Negative for chills, fatigue and fever.  HENT: Negative for congestion, hearing loss, tinnitus, trouble swallowing and voice change.   Eyes: Negative for visual disturbance.  Respiratory: Negative for cough, chest tightness, shortness of breath and wheezing.   Cardiovascular: Negative for chest pain, palpitations and leg swelling.  Gastrointestinal: Negative for abdominal pain, constipation, diarrhea and vomiting.  Endocrine: Negative for polydipsia and polyuria.  Genitourinary: Negative for dysuria, frequency, genital sores, vaginal bleeding and vaginal discharge.  Musculoskeletal: Positive for arthralgias (hip pain for bursitis). Negative for gait problem and joint swelling.  Skin: Positive for rash. Negative for color change.  Neurological: Negative for dizziness, tremors, light-headedness and headaches.  Hematological: Negative for adenopathy. Does not bruise/bleed easily.  Psychiatric/Behavioral: Negative for dysphoric mood and sleep disturbance. The patient is not nervous/anxious.  Patient Active Problem List   Diagnosis Date Noted  . Atherosclerosis of aorta ([place]) 01/09/2020  . Stage 2  moderate COPD by GOLD classification ([place]) 01/09/2020  . HSV (herpes simplex virus) infection 01/09/2020  . BCC (basal cell carcinoma), face 10/12/2017  . Environmental and seasonal allergies 10/30/2014  . Psoriasis vulgaris 10/30/2014  . Essential (primary) hypertension 10/30/2014  . Gastro-esophageal reflux disease without esophagitis 10/30/2014  . Hot flash, menopausal 10/30/2014  . Hyperlipidemia, mixed 10/30/2014  . History of cancer of left breast 10/30/2014  . Bronchiectasis ([place]) 09/24/2013    No Known Allergies  Past Surgical History:  Procedure Laterality Date  . BREAST LUMPECTOMY Left 2008  . CHOLECYSTECTOMY    . PARTIAL HYSTERECTOMY      Social History   Tobacco Use  . Smoking status: Never Smoker  . Smokeless tobacco: Never Used  Vaping Use  . Vaping Use: Never used  Substance Use Topics  . Alcohol use: No  . Drug use: No     Medication list has been reviewed and updated.  Current Meds  Medication Sig  . acetaminophen (TYLENOL) 325 MG tablet Take 2 tablets (650 mg total) by mouth every 6 (six) hours as needed for mild pain or headache (fever >/= 101).  . Ascorbic Acid (VITAMIN C) 100 MG tablet Take 100 mg by mouth daily.  Marland Kitchen aspirin EC 81 MG tablet Take 81 mg by mouth daily.  Marland Kitchen atorvastatin (LIPITOR) 10 MG tablet TAKE 1 TABLET(10 MG) BY MOUTH DAILY  . calcium-vitamin D (CALCIUM 500/D) 500-200 MG-UNIT tablet Take 1 tablet by mouth daily.  . cetirizine (ZYRTEC) 10 MG tablet Take 10 mg by mouth daily.  . famotidine (PEPCID) 20 MG tablet Take 20 mg by mouth 2 (two) times daily. PRN reflux  . fluticasone (FLONASE) 50 MCG/ACT nasal spray Place 2 sprays into both nostrils daily as needed for allergies or rhinitis.  . folic acid (FOLVITE) 1 MG tablet Take 1 mg by mouth daily as needed (for supplement).   . hydrochlorothiazide (MICROZIDE) 12.5 MG capsule TAKE 1 CAPSULE(12.5 MG) BY MOUTH DAILY  . methotrexate (RHEUMATREX) 2.5 MG tablet Take 5 mg by mouth once a  week.   . metoprolol tartrate (LOPRESSOR) 25 MG tablet TAKE 1/2 TABLET BY MOUTH EVERY DAY  . Multiple Vitamins-Minerals (MULTIVITAMIN ADULT PO) Take 1 tablet by mouth daily.   . Omega-3 Fatty Acids (FISH OIL) 1000 MG CAPS Take by mouth.  . triamcinolone cream (KENALOG) 0.1 % APP EXT AA BID PRN  . vitamin B-12 (CYANOCOBALAMIN) 1000 MCG tablet Take 1,000 mcg by mouth daily.    PHQ 2/9 Scores 01/09/2020 05/24/2019 01/07/2019 12/31/2018  PHQ - 2 Score 0 0 0 0  PHQ- 9 Score 0 0 - -    GAD 7 : Generalized Anxiety Score 01/09/2020  Nervous, Anxious, on Edge 0  Control/stop worrying 0  Worry too much - different things 0  Trouble relaxing 0  Restless 0  Easily annoyed or irritable 0  Afraid - awful might happen 0  Total GAD 7 Score 0  Anxiety Difficulty Not difficult at all    BP Readings from Last 3 Encounters:  01/09/20 (!) 125/59  05/24/19 126/70  01/07/19 108/72    Physical Exam Vitals and nursing note reviewed.  Constitutional:      General: She is not in acute distress.    Appearance: She is well-developed.  HENT:     Head: Normocephalic and atraumatic.     Right Ear: Tympanic membrane and  ear canal normal.     Left Ear: Tympanic membrane and ear canal normal.     Nose:     Right Sinus: No maxillary sinus tenderness.     Left Sinus: No maxillary sinus tenderness.  Eyes:     General: No scleral icterus.       Right eye: No discharge.        Left eye: No discharge.     Conjunctiva/sclera: Conjunctivae normal.  Neck:     Thyroid: No thyromegaly.     Vascular: No carotid bruit.  Cardiovascular:     Rate and Rhythm: Normal rate and regular rhythm.     Pulses: Normal pulses.     Heart sounds: Normal heart sounds.  Pulmonary:     Effort: Pulmonary effort is normal. No respiratory distress.     Breath sounds: No wheezing.  Abdominal:     General: Bowel sounds are normal.     Palpations: Abdomen is soft.     Tenderness: There is no abdominal tenderness.    Musculoskeletal:     Cervical back: Normal range of motion. No erythema.     Right lower leg: No edema.     Left lower leg: No edema.  Lymphadenopathy:     Cervical: No cervical adenopathy.  Skin:    General: Skin is warm and dry.     Findings: No rash.       Neurological:     Mental Status: She is alert and oriented to person, place, and time.     Cranial Nerves: No cranial nerve deficit.     Sensory: No sensory deficit.     Deep Tendon Reflexes: Reflexes are normal and symmetric.  Psychiatric:        Attention and Perception: Attention normal.        Mood and Affect: Mood normal.     Wt Readings from Last 3 Encounters:  01/09/20 127 lb (57.6 kg)  05/24/19 128 lb (58.1 kg)  01/07/19 128 lb (58.1 kg)    BP (!) 125/59   Pulse 74   Temp 97.6 F (36.4 C) (Oral)   Ht 5\' 4"  (1.626 m)   Wt 127 lb (57.6 kg)   SpO2 96%   BMI 21.80 kg/m   Assessment and Plan: 1. Annual physical exam Normal exam Continue healthy diet, exercise - POCT urinalysis dipstick  2. Essential (primary) hypertension Clinically stable exam with well controlled BP. Tolerating medications without side effects at this time. Pt to continue current regimen and low sodium diet; benefits of regular exercise as able discussed. - Comprehensive metabolic panel - TSH  3. Hyperlipidemia, mixed On high intensity statin therapy with good response and no side effects - Comprehensive metabolic panel - Lipid panel  4. Gastro-esophageal reflux disease without esophagitis Symptoms well controlled on daily Pepcid No red flag signs such as weight loss, n/v, melena Will continue same medication. - CBC with Differential/Platelet  5. History of cancer of left breast No longer followed by oncology Recent mammogram normal/stable Pt declines breast exam today  6. Atherosclerosis of aorta ([facility]) On statin therapy and aspirin daily  7. Bronchiectasis without complication ([facility]) Followed by Pulmonary; symptoms  stable Not current requiring any inhalers   8. Stage 2 moderate COPD by GOLD classification ([place]) Followed by Pulmonary Immunizations are up to date  25. HSV (herpes simplex virus) infection Recommend topical zovirax as needed - acyclovir ointment (ZOVIRAX) 5 %; Apply 1 application topically every 3 (three) hours.  Dispense: 30 g;  Refill: 1   Partially dictated using Editor, commissioning. Any errors are unintentional.  Halina Maidens, MD [employer] [facility]  01/09/2020

## 2020-01-10 ENCOUNTER — Telehealth: Payer: Self-pay

## 2020-01-10 LAB — LIPID PANEL
Chol/HDL Ratio: 2.2 ratio (ref 0.0–4.4)
Cholesterol, Total: 206 mg/dL — ABNORMAL HIGH (ref 100–199)
HDL: 93 mg/dL (ref >39)
LDL Chol Calc (NIH): 98 mg/dL (ref 0–99)
Triglycerides: 86 mg/dL (ref 0–149)
VLDL Cholesterol Cal: 15 mg/dL (ref 5–40)

## 2020-01-10 LAB — CBC WITH DIFFERENTIAL/PLATELET
Basophils Absolute: 0 10*3/uL (ref 0.0–0.2)
Basos: 0 %
EOS (ABSOLUTE): 0.3 10*3/uL (ref 0.0–0.4)
Eos: 3 %
Hematocrit: 40.8 % (ref 34.0–46.6)
Hemoglobin: 13.4 g/dL (ref 11.1–15.9)
Immature Grans (Abs): 0 10*3/uL (ref 0.0–0.1)
Immature Granulocytes: 0 %
Lymphocytes Absolute: 1.9 10*3/uL (ref 0.7–3.1)
Lymphs: 17 %
MCH: 29.9 pg (ref 26.6–33.0)
MCHC: 32.8 g/dL (ref 31.5–35.7)
MCV: 91 fL (ref 79–97)
Monocytes Absolute: 0.8 10*3/uL (ref 0.1–0.9)
Monocytes: 7 %
Neutrophils Absolute: 8 10*3/uL — ABNORMAL HIGH (ref 1.4–7.0)
Neutrophils: 73 %
Platelets: 173 10*3/uL (ref 150–450)
RBC: 4.48 x10E6/uL (ref 3.77–5.28)
RDW: 12.9 % (ref 11.7–15.4)
WBC: 11 10*3/uL — ABNORMAL HIGH (ref 3.4–10.8)

## 2020-01-10 LAB — COMPREHENSIVE METABOLIC PANEL
ALT: 15 IU/L (ref 0–32)
AST: 18 IU/L (ref 0–40)
Albumin/Globulin Ratio: 1.8 (ref 1.2–2.2)
Albumin: 4.4 g/dL (ref 3.6–4.6)
Alkaline Phosphatase: 96 IU/L (ref 44–121)
BUN/Creatinine Ratio: 30 — ABNORMAL HIGH (ref 12–28)
BUN: 24 mg/dL (ref 8–27)
Bilirubin Total: 0.5 mg/dL (ref 0.0–1.2)
CO2: 24 mmol/L (ref 20–29)
Calcium: 9.4 mg/dL (ref 8.7–10.3)
Chloride: 102 mmol/L (ref 96–106)
Creatinine, Ser: 0.8 mg/dL (ref 0.57–1.00)
GFR calc Af Amer: 78 mL/min/{1.73_m2} (ref >59)
GFR calc non Af Amer: 68 mL/min/{1.73_m2} (ref >59)
Globulin, Total: 2.5 g/dL (ref 1.5–4.5)
Glucose: 83 mg/dL (ref 65–99)
Potassium: 4.1 mmol/L (ref 3.5–5.2)
Sodium: 142 mmol/L (ref 134–144)
Total Protein: 6.9 g/dL (ref 6.0–8.5)

## 2020-01-10 LAB — TSH: TSH: 2.18 u[IU]/mL (ref 0.450–4.500)

## 2020-01-10 NOTE — Telephone Encounter (Signed)
Complete PA waiting for approval.  Key: [id number]   [id number]

## 2020-01-16 NOTE — Telephone Encounter (Signed)
PA was denied.  KP

## 2020-01-20 ENCOUNTER — Telehealth: Payer: Self-pay | Admitting: Internal Medicine

## 2020-01-20 NOTE — Telephone Encounter (Signed)
She can try the pills if she desires.  You will need to call her because I would normally prescribe pills for her lesions.  She may have requested topical rather than oral.

## 2020-01-20 NOTE — Telephone Encounter (Signed)
Patient called to inform the nurse or doctor that her script for acyclovir ointment (ZOVIRAX) 5 % is not covered under her insurance and she would like an alternative medication.  Please advise and call patient to let her know.  CB# [phone]

## 2020-01-22 ENCOUNTER — Other Ambulatory Visit: Payer: Self-pay

## 2020-01-22 MED ORDER — ACYCLOVIR 200 MG PO CAPS: 200.0000 mg | ORAL_CAPSULE | Freq: Two times a day (BID) | ORAL | 0 refills | Status: DC | PRN

## 2020-01-22 NOTE — Telephone Encounter (Signed)
Sent in tablets in place of the ointment and let patient know of this.

## 2020-02-04 DIAGNOSIS — L570 Actinic keratosis: Secondary | ICD-10-CM | POA: Diagnosis not present

## 2020-02-04 DIAGNOSIS — D2271 Melanocytic nevi of right lower limb, including hip: Secondary | ICD-10-CM | POA: Diagnosis not present

## 2020-02-04 DIAGNOSIS — C44311 Basal cell carcinoma of skin of nose: Secondary | ICD-10-CM | POA: Diagnosis not present

## 2020-02-04 DIAGNOSIS — D2262 Melanocytic nevi of left upper limb, including shoulder: Secondary | ICD-10-CM | POA: Diagnosis not present

## 2020-02-04 DIAGNOSIS — Z79899 Other long term (current) drug therapy: Secondary | ICD-10-CM | POA: Diagnosis not present

## 2020-02-04 DIAGNOSIS — D485 Neoplasm of uncertain behavior of skin: Secondary | ICD-10-CM | POA: Diagnosis not present

## 2020-02-04 DIAGNOSIS — L400 Psoriasis vulgaris: Secondary | ICD-10-CM | POA: Diagnosis not present

## 2020-02-04 DIAGNOSIS — D2261 Melanocytic nevi of right upper limb, including shoulder: Secondary | ICD-10-CM | POA: Diagnosis not present

## 2020-02-04 DIAGNOSIS — L821 Other seborrheic keratosis: Secondary | ICD-10-CM | POA: Diagnosis not present

## 2020-02-10 ENCOUNTER — Ambulatory Visit (INDEPENDENT_AMBULATORY_CARE_PROVIDER_SITE_OTHER): Payer: Medicare PPO

## 2020-02-10 ENCOUNTER — Other Ambulatory Visit: Payer: Self-pay

## 2020-02-10 VITALS — BP 110/62 | HR 70 | Temp 97.7°F | Resp 15 | Ht 62.0 in | Wt 126.8 lb

## 2020-02-10 DIAGNOSIS — Z0000 Encounter for general adult medical examination without abnormal findings: Secondary | ICD-10-CM | POA: Diagnosis not present

## 2020-02-10 NOTE — Patient Instructions (Signed)
[name] , Thank you for taking time to come for your Medicare Wellness Visit. I appreciate your ongoing commitment to your health goals. Please review the following plan we discussed and let me know if I can assist you in the future.   Screening recommendations/referrals: Colonoscopy: no longer required Mammogram: done 11/12/19 Bone Density: done 2014 Recommended yearly ophthalmology/optometry visit for glaucoma screening and checkup Recommended yearly dental visit for hygiene and checkup  Vaccinations: Influenza vaccine: done 11/21/19 Pneumococcal vaccine: done 11/22/13 Tdap vaccine: due Shingles vaccine: done 01/06/17 & 05/17/17   Covid-19: done 05/06/19, 05/26/19 & 12/04/19  Advanced directives: Please bring a copy of your health care power of attorney and living will to the office at your convenience once you have completed that paperwork.   Conditions/risks identified: Recommend continuing physical activity to improve strength in legs.  Next appointment: Follow up in one year for your annual wellness visit    Preventive Care 65 Years and Older, Female Preventive care refers to lifestyle choices and visits with your health care provider that can promote health and wellness. What does preventive care include?  A yearly physical exam. This is also called an annual well check.  Dental exams once or twice a year.  Routine eye exams. Ask your health care provider how often you should have your eyes checked.  Personal lifestyle choices, including:  Daily care of your teeth and gums.  Regular physical activity.  Eating a healthy diet.  Avoiding tobacco and drug use.  Limiting alcohol use.  Practicing safe sex.  Taking low-dose aspirin every day.  Taking vitamin and mineral supplements as recommended by your health care provider. What happens during an annual well check? The services and screenings done by your health care provider during your annual well check will depend on  your age, overall health, lifestyle risk factors, and family history of disease. Counseling  Your health care provider may ask you questions about your:  Alcohol use.  Tobacco use.  Drug use.  Emotional well-being.  Home and relationship well-being.  Sexual activity.  Eating habits.  History of falls.  Memory and ability to understand (cognition).  Work and work Statistician.  Reproductive health. Screening  You may have the following tests or measurements:  Height, weight, and BMI.  Blood pressure.  Lipid and cholesterol levels. These may be checked every 5 years, or more frequently if you are over 15 years old.  Skin check.  Lung cancer screening. You may have this screening every year starting at age 4 if you have a 30-pack-year history of smoking and currently smoke or have quit within the past 15 years.  Fecal occult blood test (FOBT) of the stool. You may have this test every year starting at age 36.  Flexible sigmoidoscopy or colonoscopy. You may have a sigmoidoscopy every 5 years or a colonoscopy every 10 years starting at age 46.  Hepatitis C blood test.  Hepatitis B blood test.  Sexually transmitted disease (STD) testing.  Diabetes screening. This is done by checking your blood sugar (glucose) after you have not eaten for a while (fasting). You may have this done every 1-3 years.  Bone density scan. This is done to screen for osteoporosis. You may have this done starting at age 56.  Mammogram. This may be done every 1-2 years. Talk to your health care provider about how often you should have regular mammograms. Talk with your health care provider about your test results, treatment options, and if necessary, the  need for more tests. Vaccines  Your health care provider may recommend certain vaccines, such as:  Influenza vaccine. This is recommended every year.  Tetanus, diphtheria, and acellular pertussis (Tdap, Td) vaccine. You may need a Td booster  every 10 years.  Zoster vaccine. You may need this after age 2.  Pneumococcal 13-valent conjugate (PCV13) vaccine. One dose is recommended after age 49.  Pneumococcal polysaccharide (PPSV23) vaccine. One dose is recommended after age 64. Talk to your health care provider about which screenings and vaccines you need and how often you need them. This information is not intended to replace advice given to you by your health care provider. Make sure you discuss any questions you have with your health care provider. Document Released: 03/20/2015 Document Revised: 11/11/2015 Document Reviewed: 12/23/2014 Elsevier Interactive Patient Education  2017 [place] Prevention in the Home Falls can cause injuries. They can happen to people of all ages. There are many things you can do to make your home safe and to help prevent falls. What can I do on the outside of my home?  Regularly fix the edges of walkways and driveways and fix any cracks.  Remove anything that might make you trip as you walk through a door, such as a raised step or threshold.  Trim any bushes or trees on the path to your home.  Use bright outdoor lighting.  Clear any walking paths of anything that might make someone trip, such as rocks or tools.  Regularly check to see if handrails are loose or broken. Make sure that both sides of any steps have handrails.  Any raised decks and porches should have guardrails on the edges.  Have any leaves, snow, or ice cleared regularly.  Use sand or salt on walking paths during winter.  Clean up any spills in your garage right away. This includes oil or grease spills. What can I do in the bathroom?  Use night lights.  Install grab bars by the toilet and in the tub and shower. Do not use towel bars as grab bars.  Use non-skid mats or decals in the tub or shower.  If you need to sit down in the shower, use a plastic, non-slip stool.  Keep the floor dry. Clean up any  water that spills on the floor as soon as it happens.  Remove soap buildup in the tub or shower regularly.  Attach bath mats securely with double-sided non-slip rug tape.  Do not have throw rugs and other things on the floor that can make you trip. What can I do in the bedroom?  Use night lights.  Make sure that you have a light by your bed that is easy to reach.  Do not use any sheets or blankets that are too big for your bed. They should not hang down onto the floor.  Have a firm chair that has side arms. You can use this for support while you get dressed.  Do not have throw rugs and other things on the floor that can make you trip. What can I do in the kitchen?  Clean up any spills right away.  Avoid walking on wet floors.  Keep items that you use a lot in easy-to-reach places.  If you need to reach something above you, use a strong step stool that has a grab bar.  Keep electrical cords out of the way.  Do not use floor polish or wax that makes floors slippery. If you must use wax,  use non-skid floor wax.  Do not have throw rugs and other things on the floor that can make you trip. What can I do with my stairs?  Do not leave any items on the stairs.  Make sure that there are handrails on both sides of the stairs and use them. Fix handrails that are broken or loose. Make sure that handrails are as long as the stairways.  Check any carpeting to make sure that it is firmly attached to the stairs. Fix any carpet that is loose or worn.  Avoid having throw rugs at the top or bottom of the stairs. If you do have throw rugs, attach them to the floor with carpet tape.  Make sure that you have a light switch at the top of the stairs and the bottom of the stairs. If you do not have them, ask someone to add them for you. What else can I do to help prevent falls?  Wear shoes that:  Do not have high heels.  Have rubber bottoms.  Are comfortable and fit you well.  Are closed  at the toe. Do not wear sandals.  If you use a stepladder:  Make sure that it is fully opened. Do not climb a closed stepladder.  Make sure that both sides of the stepladder are locked into place.  Ask someone to hold it for you, if possible.  Clearly mark and make sure that you can see:  Any grab bars or handrails.  First and last steps.  Where the edge of each step is.  Use tools that help you move around (mobility aids) if they are needed. These include:  Canes.  Walkers.  Scooters.  Crutches.  Turn on the lights when you go into a dark area. Replace any light bulbs as soon as they burn out.  Set up your furniture so you have a clear path. Avoid moving your furniture around.  If any of your floors are uneven, fix them.  If there are any pets around you, be aware of where they are.  Review your medicines with your doctor. Some medicines can make you feel dizzy. This can increase your chance of falling. Ask your doctor what other things that you can do to help prevent falls. This information is not intended to replace advice given to you by your health care provider. Make sure you discuss any questions you have with your health care provider. Document Released: 12/18/2008 Document Revised: 07/30/2015 Document Reviewed: 03/28/2014 Elsevier Interactive Patient Education  2017 [employer].

## 2020-02-10 NOTE — Progress Notes (Signed)
Subjective:   [name] is a 86 y.o. female who presents for Medicare Annual (Subsequent) preventive examination.  Review of Systems     Cardiac Risk Factors include: advanced age (>79men, >63 women);dyslipidemia;hypertension     Objective:    Today's Vitals   02/10/20 0817  BP: 110/62  Pulse: 70  Resp: 15  Temp: 97.7 F (36.5 C)  TempSrc: Oral  SpO2: 97%  Weight: 126 lb 12.8 oz (57.5 kg)  Height: 5\' 2"  (1.575 m)   Body mass index is 23.19 kg/m.  Advanced Directives 02/10/2020 12/31/2018 06/20/2018 06/20/2018 12/29/2015 03/23/2015  Does Patient Have a Medical Advance Directive? No No No No No No  Would patient like information on creating a medical advance directive? No - Patient declined Yes (MAU/Ambulatory/Procedural Areas - Information given) No - Patient declined No - Patient declined - No - patient declined information    Current Medications (verified) Outpatient Encounter Medications as of 02/10/2020  Medication Sig  . acetaminophen (TYLENOL) 325 MG tablet Take 2 tablets (650 mg total) by mouth every 6 (six) hours as needed for mild pain or headache (fever >/= 101).  . Ascorbic Acid (VITAMIN C) 100 MG tablet Take 100 mg by mouth daily.  Marland Kitchen aspirin EC 81 MG tablet Take 81 mg by mouth daily.  Marland Kitchen atorvastatin (LIPITOR) 10 MG tablet TAKE 1 TABLET(10 MG) BY MOUTH DAILY  . calcium-vitamin D (CALCIUM 500/D) 500-200 MG-UNIT tablet Take 1 tablet by mouth daily.  . cetirizine (ZYRTEC) 10 MG tablet Take 10 mg by mouth daily.  . famotidine (PEPCID) 20 MG tablet Take 20 mg by mouth 2 (two) times daily. PRN reflux  . fluticasone (FLONASE) 50 MCG/ACT nasal spray Place 2 sprays into both nostrils daily as needed for allergies or rhinitis.  . folic acid (FOLVITE) 1 MG tablet Take 1 mg by mouth daily as needed (for supplement).   . hydrochlorothiazide (MICROZIDE) 12.5 MG capsule TAKE 1 CAPSULE(12.5 MG) BY MOUTH DAILY  . methotrexate (RHEUMATREX) 2.5 MG tablet Take 5 mg by mouth once a  week.   . metoprolol tartrate (LOPRESSOR) 25 MG tablet TAKE 1/2 TABLET BY MOUTH EVERY DAY  . Multiple Vitamins-Minerals (MULTIVITAMIN ADULT PO) Take 1 tablet by mouth daily.   . Omega-3 Fatty Acids (FISH OIL) 1000 MG CAPS Take by mouth.  . triamcinolone cream (KENALOG) 0.1 % APP EXT AA BID PRN  . vitamin B-12 (CYANOCOBALAMIN) 1000 MCG tablet Take 1,000 mcg by mouth daily.  Marland Kitchen acyclovir (ZOVIRAX) 200 MG capsule Take 1 capsule (200 mg total) by mouth 2 (two) times daily as needed. (Patient not taking: Reported on 02/10/2020)  . acyclovir ointment (ZOVIRAX) 5 % Apply 1 application topically every 3 (three) hours. (Patient not taking: Reported on 02/10/2020)   No facility-administered encounter medications on file as of 02/10/2020.    Allergies (verified) Patient has no known allergies.   History: Past Medical History:  Diagnosis Date  . Allergy   . Bronchiectasis ([place])   . Cancer [facility])    left breast cancer   . GERD (gastroesophageal reflux disease)   . Hyperlipemia   . Hypertension   . Intractable nausea and vomiting 06/20/2018   Past Surgical History:  Procedure Laterality Date  . BREAST LUMPECTOMY Left 2008  . CHOLECYSTECTOMY    . PARTIAL HYSTERECTOMY     Family History  Problem Relation Age of Onset  . Breast cancer Sister    Social History   Socioeconomic History  . Marital status: Married  Spouse name: Not on file  . Number of children: 2  . Years of education: Not on file  . Highest education level: Not on file  Occupational History  . Not on file  Tobacco Use  . Smoking status: Never Smoker  . Smokeless tobacco: Never Used  Vaping Use  . Vaping Use: Never used  Substance and Sexual Activity  . Alcohol use: No  . Drug use: No  . Sexual activity: Not on file  Other Topics Concern  . Not on file  Social History Narrative  . Not on file   Social Determinants of Health   Financial Resource Strain: Low Risk   . Difficulty of Paying Living Expenses: Not hard  at all  Food Insecurity: No Food Insecurity  . Worried About Charity fundraiser in the Last Year: Never true  . Ran Out of Food in the Last Year: Never true  Transportation Needs: No Transportation Needs  . Lack of Transportation (Medical): No  . Lack of Transportation (Non-Medical): No  Physical Activity: Sufficiently Active  . Days of Exercise per Week: 7 days  . Minutes of Exercise per Session: 30 min  Stress: No Stress Concern Present  . Feeling of Stress : Not at all  Social Connections: Moderately Isolated  . Frequency of Communication with Friends and Family: More than three times a week  . Frequency of Social Gatherings with Friends and Family: Once a week  . Attends Religious Services: Never  . Active Member of Clubs or Organizations: No  . Attends Archivist Meetings: Never  . Marital Status: Married    Tobacco Counseling Counseling given: Not Answered   Clinical Intake:  Pre-visit preparation completed: Yes  Pain : No/denies pain     BMI - recorded: 23.19 Nutritional Status: BMI of 19-24  Normal Nutritional Risks: None Diabetes: No  How often do you need to have someone help you when you read instructions, pamphlets, or other written materials from your doctor or pharmacy?: 1 - Never    Interpreter Needed?: No  Information entered by :: Clemetine Marker LPN   Activities of Daily Living In your present state of health, do you have any difficulty performing the following activities: 02/10/2020 01/09/2020  Hearing? N N  Comment declines hearing aids -  Vision? N N  Difficulty concentrating or making decisions? N N  Walking or climbing stairs? N N  Dressing or bathing? N N  Doing errands, shopping? N N  Preparing Food and eating ? N -  Using the Toilet? N -  In the past six months, have you accidently leaked urine? N -  Do you have problems with loss of bowel control? N -  Managing your Medications? N -  Managing your Finances? N -    Housekeeping or managing your Housekeeping? N -  Some recent data might be hidden    Patient Care Team: Glean Hess, MD as PCP - General (Internal Medicine) Dasher, Rayvon Char, MD (Dermatology) Erby Pian, MD as Referring Physician (Specialist)  Indicate any recent Medical Services you may have received from other than [facility] providers in the past year (date may be approximate).     Assessment:   This is a routine wellness examination for Tena.  Hearing/Vision screen  Hearing Screening   125Hz  250Hz  500Hz  1000Hz  2000Hz  3000Hz  4000Hz  6000Hz  8000Hz   Right ear:           Left ear:  Comments: Pt denies hearing difficulty  Vision Screening Comments: Annual vision screenings at [place] issues and exercise activities discussed: Current Exercise Habits: Home exercise routine, Type of exercise: walking;strength training/weights;stretching, Time (Minutes): 30, Frequency (Times/Week): 7, Weekly Exercise (Minutes/Week): 210, Intensity: Moderate, Exercise limited by: None identified  Goals    . Prevent Falls     Prevent Falls and remain active.       Depression Screen PHQ 2/9 Scores 02/10/2020 01/09/2020 05/24/2019 01/07/2019 12/31/2018 08/03/2018 07/05/2018  PHQ - 2 Score 0 0 0 0 0 0 0  PHQ- 9 Score - 0 0 - - - -    Fall Risk Fall Risk  02/10/2020 01/09/2020 05/24/2019 12/31/2018 08/03/2018  Falls in the past year? 0 0 0 0 0  Number falls in past yr: 0 0 0 0 0  Injury with Fall? 0 0 0 0 0  Risk for fall due to : No Fall Risks No Fall Risks No Fall Risks - -  Follow up Falls prevention discussed Falls evaluation completed Falls evaluation completed Falls prevention discussed Falls evaluation completed    Two Strike:  Any stairs in or around the home? Yes  If so, are there any without handrails? No  Home free of loose throw rugs in walkways, pet beds, electrical cords, etc? Yes  Adequate lighting in your home to reduce  risk of falls? Yes   ASSISTIVE DEVICES UTILIZED TO PREVENT FALLS:  Life alert? No  Use of a cane, walker or w/c? No  Grab bars in the bathroom? Yes  Shower chair or bench in shower? No  Elevated toilet seat or a handicapped toilet? Yes   TIMED UP AND GO:  Was the test performed? Yes .  Length of time to ambulate 10 feet: 6 sec.   Gait steady and fast without use of assistive device  Cognitive Function:     6CIT Screen 02/10/2020 12/31/2018 12/29/2016 12/29/2015  What Year? 0 points 0 points 0 points 0 points  What month? 0 points 0 points 0 points 0 points  What time? 0 points 0 points 0 points 0 points  Count back from 20 0 points 0 points 0 points 0 points  Months in reverse 0 points 0 points 0 points 0 points  Repeat phrase 0 points 0 points 0 points 0 points  Total Score 0 0 0 0    Immunizations Immunization History  Administered Date(s) Administered  . Influenza Inj Mdck Quad Pf 12/15/2017  . Influenza,inj,Quad PF,6+ Mos 11/25/2014, 11/21/2019  . Influenza-Unspecified 12/06/2015, 11/25/2016, 11/23/2018  . PFIZER SARS-COV-2 Vaccination 05/06/2019, 05/26/2019, 12/04/2019  . Pneumococcal Conjugate-13 11/22/2013  . Pneumococcal Polysaccharide-23 03/08/2000  . Tdap 07/06/2007  . Zoster 03/08/2005  . Zoster Recombinat (Shingrix) 01/06/2017, 05/17/2017    TDAP status: Due, Education has been provided regarding the importance of this vaccine. Advised may receive this vaccine at local pharmacy or Health Dept. Aware to provide a copy of the vaccination record if obtained from local pharmacy or Health Dept. Verbalized acceptance and understanding.   Flu Vaccine status: Up to date  Pneumococcal vaccine status: Up to date  Covid-19 vaccine status: Completed vaccines  Qualifies for Shingles Vaccine? Yes   Zostavax completed Yes   Shingrix Completed?: Yes  Screening Tests Health Maintenance  Topic Date Due  . TETANUS/TDAP  01/07/2021 (Originally 07/05/2017)  . INFLUENZA  VACCINE  Completed  . COVID-19 Vaccine  Completed  . DEXA SCAN  Addressed  . PNA  vac Low Risk Adult  Discontinued    Health Maintenance  There are no preventive care reminders to display for this patient.  Colorectal cancer screening: No longer required.   Mammogram status: Completed 11/12/19. Repeat every year  Bone Density status: Completed 2014. Results reflect: Bone density results: NORMAL. Repeat every 2 years. pt declines repeat screening at this time.   Lung Cancer Screening: (Low Dose CT Chest recommended if Age 75-80 years, 30 pack-year currently smoking OR have quit w/in 15years.) does not qualify.   Additional Screening:  Hepatitis C Screening: does not qualify;   Vision Screening: Recommended annual ophthalmology exams for early detection of glaucoma and other disorders of the eye. Is the patient up to date with their annual eye exam?  Yes  Who is the provider or what is the name of the office in which the patient attends annual eye exams? Dr. Ellin Mayhew  Dental Screening: Recommended annual dental exams for proper oral hygiene  Community Resource Referral / Chronic Care Management: CRR required this visit?  No   CCM required this visit?  No      Plan:     I have personally reviewed and noted the following in the patient's chart:   . Medical and social history . Use of alcohol, tobacco or illicit drugs  . Current medications and supplements . Functional ability and status . Nutritional status . Physical activity . Advanced directives . List of other physicians . Hospitalizations, surgeries, and ER visits in previous 12 months . Vitals . Screenings to include cognitive, depression, and falls . Referrals and appointments  In addition, I have reviewed and discussed with patient certain preventive protocols, quality metrics, and best practice recommendations. A written personalized care plan for preventive services as well as general preventive health  recommendations were provided to patient.     Clemetine Marker, LPN   [id number]   Nurse Notes: pt states she feels out of breath more easily since having Covid in  2020 and also feels heaviness in her upper legs. Pt has appt with pulmonology on 02/20/20 at [facility] and she will getting a chest xray and pulmonary function test at that time. Pt has completed physical therapy and went to ortho for heaviness/weakness in legs and received shots in right hip but still feeling weakness.

## 2020-02-11 ENCOUNTER — Other Ambulatory Visit: Payer: Self-pay | Admitting: Internal Medicine

## 2020-02-11 MED ORDER — HYDROCHLOROTHIAZIDE 12.5 MG PO CAPS: ORAL_CAPSULE | ORAL | 1 refills | Status: DC

## 2020-02-11 NOTE — Telephone Encounter (Signed)
Requested Prescriptions  Pending Prescriptions Disp Refills  . hydrochlorothiazide (MICROZIDE) 12.5 MG capsule [Pharmacy Med Name: HYDROCHLOROTHIAZIDE 12.5MG  CAPSULES] 90 capsule 1    Sig: TAKE 1 CAPSULE(12.5 MG) BY MOUTH DAILY     Cardiovascular: Diuretics - Thiazide Passed - 02/11/2020  9:17 AM      Passed - Ca in normal range and within 360 days    Calcium  Date Value Ref Range Status  01/09/2020 9.4 8.7 - 10.3 mg/dL Final   Calcium, Total  Date Value Ref Range Status  10/07/2013 8.9 8.5 - 10.1 mg/dL Final         Passed - Cr in normal range and within 360 days    Creatinine  Date Value Ref Range Status  10/07/2013 0.69 0.60 - 1.30 mg/dL Final   Creatinine, Ser  Date Value Ref Range Status  01/09/2020 0.80 0.57 - 1.00 mg/dL Final         Passed - K in normal range and within 360 days    Potassium  Date Value Ref Range Status  01/09/2020 4.1 3.5 - 5.2 mmol/L Final  10/07/2013 4.2 3.5 - 5.1 mmol/L Final         Passed - Na in normal range and within 360 days    Sodium  Date Value Ref Range Status  01/09/2020 142 134 - 144 mmol/L Final  10/07/2013 140 136 - 145 mmol/L Final         Passed - Last BP in normal range    BP Readings from Last 1 Encounters:  02/10/20 110/62         Passed - Valid encounter within last 6 months    Recent Outpatient Visits          1 month ago Annual physical exam   [facility] Glean Hess, MD   8 months ago Acute cystitis with hematuria   [facility] Glean Hess, MD   1 year ago Annual physical exam   [facility] Glean Hess, MD   1 year ago Acute cystitis with hematuria   [facility] Glean Hess, MD   1 year ago History of 2019 novel coronavirus disease (COVID-19)   [facility] Glean Hess, MD      Future Appointments            In 4 months Glean Hess, MD [facility], [place]   In 11 months Army Melia Jesse Sans, MD [facility],  [facility]

## 2020-02-11 NOTE — Addendum Note (Signed)
Addended by: Fransisca Connors A on: 02/11/2020 10:15 AM   Modules accepted: Orders

## 2020-02-11 NOTE — Telephone Encounter (Signed)
Sent refill to to pharmacy under Dr Brita Romp will resend with correct Dr Army Melia.

## 2020-02-20 DIAGNOSIS — R0600 Dyspnea, unspecified: Secondary | ICD-10-CM | POA: Diagnosis not present

## 2020-02-20 DIAGNOSIS — J479 Bronchiectasis, uncomplicated: Secondary | ICD-10-CM | POA: Diagnosis not present

## 2020-02-20 DIAGNOSIS — R9389 Abnormal findings on diagnostic imaging of other specified body structures: Secondary | ICD-10-CM | POA: Diagnosis not present

## 2020-02-20 DIAGNOSIS — J439 Emphysema, unspecified: Secondary | ICD-10-CM | POA: Diagnosis not present

## 2020-02-20 DIAGNOSIS — Z01818 Encounter for other preprocedural examination: Secondary | ICD-10-CM | POA: Diagnosis not present

## 2020-03-03 ENCOUNTER — Other Ambulatory Visit: Payer: Self-pay | Admitting: Internal Medicine

## 2020-03-30 DIAGNOSIS — C44311 Basal cell carcinoma of skin of nose: Secondary | ICD-10-CM | POA: Diagnosis not present

## 2020-04-07 ENCOUNTER — Other Ambulatory Visit: Payer: Self-pay | Admitting: Internal Medicine

## 2020-04-09 DIAGNOSIS — G8929 Other chronic pain: Secondary | ICD-10-CM | POA: Diagnosis not present

## 2020-04-09 DIAGNOSIS — M25551 Pain in right hip: Secondary | ICD-10-CM | POA: Diagnosis not present

## 2020-04-09 DIAGNOSIS — M5136 Other intervertebral disc degeneration, lumbar region: Secondary | ICD-10-CM | POA: Diagnosis not present

## 2020-04-09 DIAGNOSIS — M5416 Radiculopathy, lumbar region: Secondary | ICD-10-CM | POA: Diagnosis not present

## 2020-04-09 DIAGNOSIS — M76891 Other specified enthesopathies of right lower limb, excluding foot: Secondary | ICD-10-CM | POA: Diagnosis not present

## 2020-04-09 DIAGNOSIS — R29898 Other symptoms and signs involving the musculoskeletal system: Secondary | ICD-10-CM | POA: Diagnosis not present

## 2020-04-09 DIAGNOSIS — M7061 Trochanteric bursitis, right hip: Secondary | ICD-10-CM | POA: Diagnosis not present

## 2020-04-09 DIAGNOSIS — I700 Atherosclerosis of aorta: Secondary | ICD-10-CM | POA: Diagnosis not present

## 2020-05-14 DIAGNOSIS — H2513 Age-related nuclear cataract, bilateral: Secondary | ICD-10-CM | POA: Diagnosis not present

## 2020-05-14 DIAGNOSIS — H401131 Primary open-angle glaucoma, bilateral, mild stage: Secondary | ICD-10-CM | POA: Diagnosis not present

## 2020-05-21 DIAGNOSIS — M5136 Other intervertebral disc degeneration, lumbar region: Secondary | ICD-10-CM | POA: Diagnosis not present

## 2020-05-21 DIAGNOSIS — M25551 Pain in right hip: Secondary | ICD-10-CM | POA: Diagnosis not present

## 2020-05-21 DIAGNOSIS — M76891 Other specified enthesopathies of right lower limb, excluding foot: Secondary | ICD-10-CM | POA: Diagnosis not present

## 2020-05-21 DIAGNOSIS — G8929 Other chronic pain: Secondary | ICD-10-CM | POA: Diagnosis not present

## 2020-05-21 DIAGNOSIS — M7061 Trochanteric bursitis, right hip: Secondary | ICD-10-CM | POA: Diagnosis not present

## 2020-06-02 ENCOUNTER — Other Ambulatory Visit: Payer: Self-pay

## 2020-06-02 ENCOUNTER — Ambulatory Visit: Payer: Medicare PPO | Attending: Sports Medicine | Admitting: Physical Therapy

## 2020-06-02 ENCOUNTER — Encounter: Payer: Self-pay | Admitting: Physical Therapy

## 2020-06-02 DIAGNOSIS — Z9221 Personal history of antineoplastic chemotherapy: Secondary | ICD-10-CM | POA: Diagnosis not present

## 2020-06-02 DIAGNOSIS — R0602 Shortness of breath: Secondary | ICD-10-CM | POA: Diagnosis not present

## 2020-06-02 DIAGNOSIS — G319 Degenerative disease of nervous system, unspecified: Secondary | ICD-10-CM | POA: Diagnosis not present

## 2020-06-02 DIAGNOSIS — E039 Hypothyroidism, unspecified: Secondary | ICD-10-CM | POA: Diagnosis not present

## 2020-06-02 DIAGNOSIS — Z803 Family history of malignant neoplasm of breast: Secondary | ICD-10-CM | POA: Diagnosis not present

## 2020-06-02 DIAGNOSIS — M6281 Muscle weakness (generalized): Secondary | ICD-10-CM | POA: Insufficient documentation

## 2020-06-02 DIAGNOSIS — J323 Chronic sphenoidal sinusitis: Secondary | ICD-10-CM | POA: Diagnosis not present

## 2020-06-02 DIAGNOSIS — M19012 Primary osteoarthritis, left shoulder: Secondary | ICD-10-CM | POA: Diagnosis not present

## 2020-06-02 DIAGNOSIS — I10 Essential (primary) hypertension: Secondary | ICD-10-CM | POA: Diagnosis not present

## 2020-06-02 DIAGNOSIS — Z90711 Acquired absence of uterus with remaining cervical stump: Secondary | ICD-10-CM | POA: Diagnosis not present

## 2020-06-02 DIAGNOSIS — I959 Hypotension, unspecified: Secondary | ICD-10-CM | POA: Diagnosis present

## 2020-06-02 DIAGNOSIS — W010XXA Fall on same level from slipping, tripping and stumbling without subsequent striking against object, initial encounter: Secondary | ICD-10-CM | POA: Diagnosis present

## 2020-06-02 DIAGNOSIS — I480 Paroxysmal atrial fibrillation: Secondary | ICD-10-CM | POA: Diagnosis not present

## 2020-06-02 DIAGNOSIS — Z01810 Encounter for preprocedural cardiovascular examination: Secondary | ICD-10-CM | POA: Diagnosis not present

## 2020-06-02 DIAGNOSIS — M25519 Pain in unspecified shoulder: Secondary | ICD-10-CM | POA: Diagnosis not present

## 2020-06-02 DIAGNOSIS — R531 Weakness: Secondary | ICD-10-CM | POA: Diagnosis not present

## 2020-06-02 DIAGNOSIS — E782 Mixed hyperlipidemia: Secondary | ICD-10-CM | POA: Diagnosis not present

## 2020-06-02 DIAGNOSIS — Y92009 Unspecified place in unspecified non-institutional (private) residence as the place of occurrence of the external cause: Secondary | ICD-10-CM | POA: Diagnosis not present

## 2020-06-02 DIAGNOSIS — Z853 Personal history of malignant neoplasm of breast: Secondary | ICD-10-CM | POA: Diagnosis not present

## 2020-06-02 DIAGNOSIS — J984 Other disorders of lung: Secondary | ICD-10-CM | POA: Diagnosis present

## 2020-06-02 DIAGNOSIS — S72002D Fracture of unspecified part of neck of left femur, subsequent encounter for closed fracture with routine healing: Secondary | ICD-10-CM | POA: Diagnosis not present

## 2020-06-02 DIAGNOSIS — W19XXXA Unspecified fall, initial encounter: Secondary | ICD-10-CM | POA: Diagnosis not present

## 2020-06-02 DIAGNOSIS — J90 Pleural effusion, not elsewhere classified: Secondary | ICD-10-CM | POA: Diagnosis not present

## 2020-06-02 DIAGNOSIS — E785 Hyperlipidemia, unspecified: Secondary | ICD-10-CM | POA: Diagnosis present

## 2020-06-02 DIAGNOSIS — Z20822 Contact with and (suspected) exposure to covid-19: Secondary | ICD-10-CM | POA: Diagnosis present

## 2020-06-02 DIAGNOSIS — J9811 Atelectasis: Secondary | ICD-10-CM | POA: Diagnosis not present

## 2020-06-02 DIAGNOSIS — S72012A Unspecified intracapsular fracture of left femur, initial encounter for closed fracture: Secondary | ICD-10-CM | POA: Diagnosis present

## 2020-06-02 DIAGNOSIS — I700 Atherosclerosis of aorta: Secondary | ICD-10-CM | POA: Diagnosis not present

## 2020-06-02 DIAGNOSIS — Z743 Need for continuous supervision: Secondary | ICD-10-CM | POA: Diagnosis not present

## 2020-06-02 DIAGNOSIS — N390 Urinary tract infection, site not specified: Secondary | ICD-10-CM | POA: Diagnosis not present

## 2020-06-02 DIAGNOSIS — S42022A Displaced fracture of shaft of left clavicle, initial encounter for closed fracture: Secondary | ICD-10-CM | POA: Diagnosis not present

## 2020-06-02 DIAGNOSIS — R279 Unspecified lack of coordination: Secondary | ICD-10-CM | POA: Diagnosis not present

## 2020-06-02 DIAGNOSIS — S72002S Fracture of unspecified part of neck of left femur, sequela: Secondary | ICD-10-CM | POA: Diagnosis not present

## 2020-06-02 DIAGNOSIS — K219 Gastro-esophageal reflux disease without esophagitis: Secondary | ICD-10-CM | POA: Diagnosis present

## 2020-06-02 DIAGNOSIS — Z79899 Other long term (current) drug therapy: Secondary | ICD-10-CM | POA: Diagnosis not present

## 2020-06-02 DIAGNOSIS — I272 Pulmonary hypertension, unspecified: Secondary | ICD-10-CM | POA: Diagnosis not present

## 2020-06-02 DIAGNOSIS — Z7982 Long term (current) use of aspirin: Secondary | ICD-10-CM | POA: Diagnosis not present

## 2020-06-02 DIAGNOSIS — R918 Other nonspecific abnormal finding of lung field: Secondary | ICD-10-CM | POA: Diagnosis not present

## 2020-06-02 DIAGNOSIS — G8929 Other chronic pain: Secondary | ICD-10-CM | POA: Diagnosis present

## 2020-06-02 DIAGNOSIS — I4891 Unspecified atrial fibrillation: Secondary | ICD-10-CM | POA: Diagnosis not present

## 2020-06-02 DIAGNOSIS — S72002A Fracture of unspecified part of neck of left femur, initial encounter for closed fracture: Secondary | ICD-10-CM | POA: Diagnosis not present

## 2020-06-02 DIAGNOSIS — S72092A Other fracture of head and neck of left femur, initial encounter for closed fracture: Secondary | ICD-10-CM | POA: Diagnosis not present

## 2020-06-02 DIAGNOSIS — I4892 Unspecified atrial flutter: Secondary | ICD-10-CM | POA: Diagnosis not present

## 2020-06-02 DIAGNOSIS — Z923 Personal history of irradiation: Secondary | ICD-10-CM | POA: Diagnosis not present

## 2020-06-02 DIAGNOSIS — J9601 Acute respiratory failure with hypoxia: Secondary | ICD-10-CM | POA: Diagnosis present

## 2020-06-02 DIAGNOSIS — R0902 Hypoxemia: Secondary | ICD-10-CM | POA: Diagnosis not present

## 2020-06-02 DIAGNOSIS — S42002A Fracture of unspecified part of left clavicle, initial encounter for closed fracture: Secondary | ICD-10-CM | POA: Diagnosis not present

## 2020-06-02 DIAGNOSIS — R262 Difficulty in walking, not elsewhere classified: Secondary | ICD-10-CM | POA: Insufficient documentation

## 2020-06-02 DIAGNOSIS — J449 Chronic obstructive pulmonary disease, unspecified: Secondary | ICD-10-CM | POA: Diagnosis not present

## 2020-06-02 DIAGNOSIS — S0990XA Unspecified injury of head, initial encounter: Secondary | ICD-10-CM | POA: Diagnosis not present

## 2020-06-02 DIAGNOSIS — J948 Other specified pleural conditions: Secondary | ICD-10-CM | POA: Diagnosis not present

## 2020-06-02 DIAGNOSIS — Z01818 Encounter for other preprocedural examination: Secondary | ICD-10-CM | POA: Diagnosis not present

## 2020-06-02 DIAGNOSIS — I6782 Cerebral ischemia: Secondary | ICD-10-CM | POA: Diagnosis not present

## 2020-06-02 DIAGNOSIS — M25552 Pain in left hip: Secondary | ICD-10-CM | POA: Diagnosis present

## 2020-06-02 DIAGNOSIS — M25551 Pain in right hip: Secondary | ICD-10-CM | POA: Insufficient documentation

## 2020-06-02 DIAGNOSIS — R7989 Other specified abnormal findings of blood chemistry: Secondary | ICD-10-CM | POA: Diagnosis not present

## 2020-06-02 DIAGNOSIS — R609 Edema, unspecified: Secondary | ICD-10-CM | POA: Diagnosis not present

## 2020-06-02 NOTE — Therapy (Signed)
[facility] [address] [place], [place], [address] Phone: [phone]   Fax:  [phone]  Physical Therapy Evaluation  Patient Details  Name: [name] MRN: [id number] Date of Birth: 07-19-1933 Referring Provider (PT): Candelaria Stagers, Loni Muse   Encounter Date: 06/02/2020   PT End of Session - 06/02/20 0900    Visit Number 1    Number of Visits 13    Date for PT Re-Evaluation 07/14/20    Authorization Type IE 06/02/2020    PT Start Time 0900    PT Stop Time 0955    PT Time Calculation (min) 55 min    Activity Tolerance Patient tolerated treatment well    Behavior During Therapy [name] for tasks assessed/performed           Past Medical History:  Diagnosis Date  . Allergy   . Bronchiectasis ([place])   . Cancer [facility])    left breast cancer   . GERD (gastroesophageal reflux disease)   . Hyperlipemia   . Hypertension   . Intractable nausea and vomiting 06/20/2018    Past Surgical History:  Procedure Laterality Date  . BREAST LUMPECTOMY Left 2008  . CHOLECYSTECTOMY    . PARTIAL HYSTERECTOMY      There were no vitals filed for this visit.    Subjective Assessment - 06/02/20 0911    Subjective Patient notes R hip pain has progressed over the past year. Patient notes pain is over the R trochanter. Patient states pain has been helped moderately by injections with the first injection being most helpful (1 month relief) and the following injection provided roughly 1 week of relief. Patient has not been able to participate in regular golfing events as a result of hip pain. Patient denies clicking, popping, catching. Patient is unable to sleep in bed because of the pain (patient reports side sleeping as preferred position). Patient has didfficulty with transfers, bed mobility (10/10), and bending over/stooping. Patient has started using SPC to avoid falls; notes RLE has not given way at all. Occasionally patient has pain that wraps around to the  back to the SIJ and down the outside the of the leg (rarely below the knee).    Patient is accompained by: Family member   Spouse, [name]   Limitations Walking    How long can you walk comfortably? 15 min    Currently in Pain? Yes    Pain Score 3     Pain Location Trochanter    Pain Orientation Right    Pain Descriptors / Indicators Aching              [facility] PT Assessment - 06/02/20 0902      Assessment   Medical Diagnosis R hip pain,    Referring Provider (PT) Candelaria Stagers, A    Prior Therapy yes           OBJECTIVE  Mental Status Patient is oriented to person, place and time.  Recent memory is intact.  Remote memory is intact.  Attention span and concentration are intact.  Expressive speech is intact.  Patient's fund of knowledge is within normal limits for educational level.  POSTURE/OBSERVATIONS:  Lumbar lordosis: swayback/diminished lumbar curve Thoracic kyphosis: increased and increased length Iliac crest height: equal bilaterally Lumbar lateral shift: negative Pelvic obliquity: negative Leg length discrepancy: negative  GAIT: Limited stride length B with limited foot clearance Trendelenburg R: Positive L: Positive  RANGE OF MOTION:    LEFT RIGHT  Lumbar forward flexion (  65):  WFL    Lumbar extension (30): WFL    Lumbar lateral flexion (25):  [facility]  Thoracic and Lumbar rotation (30 degrees):    [facility] WFL  Hip Flexion (0-125):   WNL WNL  Hip IR (0-45):  WNL WNL  Hip ER (0-45):  WNL Unable to test 2/2 to irritability  Hip Abduction (0-40):  WNL PROM   Hip extension (0-15):  WNL WNL    SENSATION: Grossly intact to light touch bilateral LEs as determined by testing dermatomes L2-S2 Proprioception and hot/cold testing deferred on this date  STRENGTH: MMT   RLE LLE  Hip Flexion 4 4  Hip Extension 4 4  Hip Abduction  3 4  Hip Adduction  4 4  Hip ER  3 4  Hip IR  4 4  Knee Extension 4 4  Knee Flexion 4 4  Dorsiflexion  4 4  Plantarflexion (seated) 4  4   SPECIAL TESTS: Centralization and Peripheralization (SN 92, -LR 0.12): Negative SLR (SN 92, -LR 0.29): R: Negative L:  Negative FABER (SN 81): R: Positive L: Negative FADIR (SN 94): R: Negative L: Negative Hip scour (SN 50): R: Negative L: Negative  MUSCLE LENGTH: Obers: R: Positive for pain, negative for TFL/ITB tension L: Negative Popliteal angle: R: 105 degrees L: 120 degrees  PHYSICAL PERFORMANCE MEASURES: STS: WFL, painful   Objective measurements completed on examination: See above findings.    ASSESSMENT Patient is an 87 year old presenting to clinic with chief complaints of persistent R hip pain. Upon examination, patient demonstrates deficits in pain free R hip ROM, pain, gait, R hip strength, and activity tolerance as evidenced by (+) FABER on R, pain to palpation of deep hip rotators posterior to greater trochanter on R, 10/10 worst pain, 3/5 R hip ER and abduction, limited foot clearance B with (+) trendelenburg bilaterally, popliteal angle R 105 degrees. Patient's responses on FOTO outcome measures (41) indicate significant functional limitations/disability/distress. Patient's progress may be limited due to persistence of pain; however, patient's motivation and prior activity level are advantageous. Patient was able to achieve basic hamstring and adductor stretches without increased pain during today's evaluation and responded positively to educational interventions. Patient will benefit from continued skilled therapeutic intervention to address deficits in R hip ROM, pain, gait, R hip strength, and activity tolerance in order to increase function and improve overall QOL.   EDUCATION Patient educated on prognosis, POC, and provided with HEP including: hamstring and adductor stretches. Patient articulated understanding and returned demonstration. Patient will benefit from further education in order to maximize compliance and understanding for long-term therapeutic gains.      TREATMENT Neuromuscular Re-education: Patient education on anatomy of lateral hip for improved understanding of pain and forces over the greater trochanter. Patient education on [facility] use for improved support of RLE.  Hamstring stretch, B, supine hooklying, for improved hip mobility and decreased pain/spasm            PT Long Term Goals - 06/02/20 1553      PT LONG TERM GOAL #1   Title Patient will decrease worst pain as reported on NPRS by at least 2 points to demonstrate clinically significant reduction in pain in order to restore/improve function and overall QOL.    Baseline IE: 10/10 (bed mobility)    Time 6    Period Weeks    Status New    Target Date 07/14/20      PT LONG TERM GOAL #2  Title Patient will report being able to return to activities including, but not limited to: 1 mile walk and golf outings without pain or limitation to indicate complete resolution of the chief complaint and return to prior level of participation at home and in the community.    Baseline IE: unable to participate 2/2 to pain    Time 6    Period Weeks    Status New    Target Date 07/14/20      PT LONG TERM GOAL #3   Title Patient will demonstrate improved function as evidenced by a score of 53 on FOTO measure for full participation in activities at home and in the community.    Baseline IE: 41    Time 6    Period Weeks    Status New    Target Date 07/14/20      PT LONG TERM GOAL #4   Title Patient will demonstrate improved R hip strength and ROM as evidenced by 4/5 MMT R hip ER and abduction as well as (-) FABER test to indicate clinically significant improvements and safe return to sport.    Baseline IE: 3/5 (+) R FABER    Time 6    Period Weeks    Status New    Target Date 07/14/20                  Plan - 06/02/20 0900    Clinical Impression Statement Patient is an 87 year old presenting to clinic with chief complaints of persistent R hip pain. Upon examination,  patient demonstrates deficits in pain free R hip ROM, pain, gait, R hip strength, and activity tolerance as evidenced by (+) FABER on R, pain to palpation of deep hip rotators posterior to greater trochanter on R, 10/10 worst pain, 3/5 R hip ER and abduction, limited foot clearance B with (+) trendelenburg bilaterally, popliteal angle R 105 degrees. Patient's responses on FOTO outcome measures (41) indicate significant functional limitations/disability/distress. Patient's progress may be limited due to persistence of pain; however, patient's motivation and prior activity level are advantageous. Patient was able to achieve basic hamstring and adductor stretches without increased pain during today's evaluation and responded positively to educational interventions. Patient will benefit from continued skilled therapeutic intervention to address deficits in R hip ROM, pain, gait, R hip strength, and activity tolerance in order to increase function and improve overall QOL.    Personal Factors and Comorbidities Age;Fitness;Comorbidity 3+;Time since onset of injury/illness/exacerbation;Past/Current Experience    Comorbidities COPD, cancer, arthritis, back pain    Examination-Activity Limitations Locomotion Level;Transfers;Squat;Stairs;Lift;Bed Mobility    Examination-Participation Restrictions Shop;Yard Work;Laundry;Community Activity    Stability/Clinical Decision Making Evolving/Moderate complexity    Clinical Decision Making Moderate    Rehab Potential Fair    PT Frequency 2x / week    PT Duration 6 weeks    PT Treatment/Interventions ADLs/Self Care Home Management;Aquatic Therapy;Cryotherapy;Iontophoresis 4mg /ml Dexamethasone;Moist Heat;Gait training;Stair training;Functional mobility training;Therapeutic activities;Therapeutic exercise;Neuromuscular re-education;Patient/family education;Orthotic Fit/Training;Manual techniques;Passive range of motion;Dry needling;Splinting;Taping;Joint Manipulations;Spinal  Manipulations    PT Next Visit Plan manual as needed; hip stabilization exercises    PT Home Exercise Plan hamstring and adductor stretch    Consulted and Agree with Plan of Care Patient           Patient will benefit from skilled therapeutic intervention in order to improve the following deficits and impairments:  Abnormal gait,Decreased activity tolerance,Decreased balance,Decreased mobility,Decreased range of motion,Decreased strength,Difficulty walking,Hypomobility,Postural dysfunction,Improper body mechanics,Pain  Visit Diagnosis: Pain in right hip  Muscle weakness (generalized)  Difficulty in walking, not elsewhere classified     Problem List Patient Active Problem List   Diagnosis Date Noted  . Atherosclerosis of aorta ([place]) 01/09/2020  . Stage 2 moderate COPD by GOLD classification ([place]) 01/09/2020  . HSV (herpes simplex virus) infection 01/09/2020  . BCC (basal cell carcinoma), face 10/12/2017  . Environmental and seasonal allergies 10/30/2014  . Psoriasis vulgaris 10/30/2014  . Essential (primary) hypertension 10/30/2014  . Gastro-esophageal reflux disease without esophagitis 10/30/2014  . Hot flash, menopausal 10/30/2014  . Hyperlipidemia, mixed 10/30/2014  . History of cancer of left breast 10/30/2014  . Bronchiectasis [facility]) 09/24/2013   [name] PT, DPT [phone]  06/02/2020, 3:55 PM  [place] [facility] [address] [place], [place], [address] Phone: [phone]   Fax:  [phone]  Name: ZAHIRA BRUMMOND MRN: [id number] Date of Birth: 14-May-1933

## 2020-06-04 ENCOUNTER — Other Ambulatory Visit: Payer: Self-pay | Admitting: Internal Medicine

## 2020-06-04 ENCOUNTER — Encounter: Payer: Self-pay | Admitting: Physical Therapy

## 2020-06-04 ENCOUNTER — Other Ambulatory Visit: Payer: Self-pay

## 2020-06-04 ENCOUNTER — Ambulatory Visit: Payer: Medicare PPO | Admitting: Physical Therapy

## 2020-06-04 DIAGNOSIS — M25551 Pain in right hip: Secondary | ICD-10-CM

## 2020-06-04 DIAGNOSIS — M6281 Muscle weakness (generalized): Secondary | ICD-10-CM

## 2020-06-04 DIAGNOSIS — R262 Difficulty in walking, not elsewhere classified: Secondary | ICD-10-CM

## 2020-06-04 DIAGNOSIS — J3089 Other allergic rhinitis: Secondary | ICD-10-CM

## 2020-06-04 NOTE — Telephone Encounter (Signed)
Requested medication (s) are due for refill today: Yes  Requested medication (s) are on the active medication list: Yes  Last refill:  05/14/19  Future visit scheduled: Yes  Notes to clinic:  Unable to refill per protocol, Rx expired.      Requested Prescriptions  Pending Prescriptions Disp Refills   fluticasone (FLONASE) 50 MCG/ACT nasal spray [Pharmacy Med Name: FLUTICASONE 50MCG NASAL SP (120) RX] 48 g 3    Sig: SHAKE LIQUID AND USE 2 SPRAYS IN EACH NOSTRIL DAILY AS NEEDED FOR ALLERGIES OR RHINITIS      Ear, Nose, and Throat: Nasal Preparations - Corticosteroids Passed - 06/04/2020  4:33 PM      Passed - Valid encounter within last 12 months    Recent Outpatient Visits           4 months ago Annual physical exam   [facility] Glean Hess, MD   1 year ago Acute cystitis with hematuria   [facility] Glean Hess, MD   1 year ago Annual physical exam   [facility] Glean Hess, MD   1 year ago Acute cystitis with hematuria   [facility] Glean Hess, MD   1 year ago History of 2019 novel coronavirus disease (COVID-19)   [facility] Glean Hess, MD       Future Appointments             In 3 weeks Army Melia Jesse Sans, MD [facility], [place]   In 7 months Army Melia, Jesse Sans, MD [facility]

## 2020-06-04 NOTE — Therapy (Signed)
[place] [facility] [address] [place], [place], [address] Phone: [phone]   Fax:  [phone]  Physical Therapy Treatment  Patient Details  Name: [name] MRN: [id number] Date of Birth: May 05, 1933 Referring Provider (PT): Farrel Demark   Encounter Date: 06/04/2020   PT End of Session - 06/04/20 1138    Visit Number 2    Number of Visits 13    Date for PT Re-Evaluation 07/14/20    Authorization Type IE 06/02/2020    PT Start Time 0900    PT Stop Time 0955    PT Time Calculation (min) 55 min    Activity Tolerance Patient tolerated treatment well    Behavior During Therapy [facility] for tasks assessed/performed           Past Medical History:  Diagnosis Date  . Allergy   . Bronchiectasis ([place])   . Cancer [facility])    left breast cancer   . GERD (gastroesophageal reflux disease)   . Hyperlipemia   . Hypertension   . Intractable nausea and vomiting 06/20/2018    Past Surgical History:  Procedure Laterality Date  . BREAST LUMPECTOMY Left 2008  . CHOLECYSTECTOMY    . PARTIAL HYSTERECTOMY      There were no vitals filed for this visit.   Subjective Assessment - 06/04/20 0902    Subjective Patient notes pain after evaluation was not increased. Patient states that she continues to be able to prop her leg up for lotion application after bathing without pain/complaint. Patient does have some pain with getting dressed.    Patient is accompained by: Family member   Spouse, [name]   Limitations Walking    How long can you walk comfortably? 15 min    Currently in Pain? Yes    Pain Score 1     Pain Orientation Right          TREATMENT  Pre-treatment assessment: Leg length L 82 cm, R 80.5 cm  Therapeutic Exercise: B hamstring stretch, supine B piriformis/glute stretch, supine B adductor stretch, supine  B static hip abduction isometric, 5 sec hold x20, no increased pain Heel lift in R shoe and gait with SPC in hallway to assess  impact on stride length and R hip pain. Patient education on modifications to ADLs and positioning/body mechanics for decreased tensile forces over the greater trochanter.   Patient educated throughout session on appropriate technique and form using multi-modal cueing, HEP, and activity modification. Patient articulated understanding and returned demonstration.  Patient Response to interventions: No increased pain.  ASSESSMENT Patient presents to clinic with excellent motivation to participate in therapy. Patient demonstrates deficits in pain free R hip ROM, pain, gait, R hip strength, and activity tolerance. Patient able to achieve isometric contractions of hip abductors without increased pain during today's session and responded positively to active and educational interventions. Patient will benefit from continued skilled therapeutic intervention to address remaining deficits in pain free R hip ROM, pain, gait, R hip strength, and activity tolerance in order to increase function and improve overall QOL.     PT Long Term Goals - 06/02/20 1553      PT LONG TERM GOAL #1   Title Patient will decrease worst pain as reported on NPRS by at least 2 points to demonstrate clinically significant reduction in pain in order to restore/improve function and overall QOL.    Baseline IE: 10/10 (bed mobility)    Time 6    Period Weeks  Status New    Target Date 07/14/20      PT LONG TERM GOAL #2   Title Patient will report being able to return to activities including, but not limited to: 1 mile walk and golf outings without pain or limitation to indicate complete resolution of the chief complaint and return to prior level of participation at home and in the community.    Baseline IE: unable to participate 2/2 to pain    Time 6    Period Weeks    Status New    Target Date 07/14/20      PT LONG TERM GOAL #3   Title Patient will demonstrate improved function as evidenced by a score of 53 on FOTO  measure for full participation in activities at home and in the community.    Baseline IE: 41    Time 6    Period Weeks    Status New    Target Date 07/14/20      PT LONG TERM GOAL #4   Title Patient will demonstrate improved R hip strength and ROM as evidenced by 4/5 MMT R hip ER and abduction as well as (-) FABER test to indicate clinically significant improvements and safe return to sport.    Baseline IE: 3/5 (+) R FABER    Time 6    Period Weeks    Status New    Target Date 07/14/20                 Plan - 06/04/20 1138    Clinical Impression Statement Patient presents to clinic with excellent motivation to participate in therapy. Patient demonstrates deficits in pain free R hip ROM, pain, gait, R hip strength, and activity tolerance. Patient able to achieve isometric contractions of hip abductors without increased pain during today's session and responded positively to active and educational interventions. Patient will benefit from continued skilled therapeutic intervention to address remaining deficits in pain free R hip ROM, pain, gait, R hip strength, and activity tolerance in order to increase function and improve overall QOL.    Personal Factors and Comorbidities Age;Fitness;Comorbidity 3+;Time since onset of injury/illness/exacerbation;Past/Current Experience    Comorbidities COPD, cancer, arthritis, back pain    Examination-Activity Limitations Locomotion Level;Transfers;Squat;Stairs;Lift;Bed Mobility    Examination-Participation Restrictions Shop;Yard Work;Laundry;Community Activity    Stability/Clinical Decision Making Evolving/Moderate complexity    Rehab Potential Fair    PT Frequency 2x / week    PT Duration 6 weeks    PT Treatment/Interventions ADLs/Self Care Home Management;Aquatic Therapy;Cryotherapy;Iontophoresis 4mg /ml Dexamethasone;Moist Heat;Gait training;Stair training;Functional mobility training;Therapeutic activities;Therapeutic exercise;Neuromuscular  re-education;Patient/family education;Orthotic Fit/Training;Manual techniques;Passive range of motion;Dry needling;Splinting;Taping;Joint Manipulations;Spinal Manipulations    PT Next Visit Plan manual as needed; hip stabilization exercises    PT Home Exercise Plan hamstring and adductor stretch, static hip abduction isometric    Consulted and Agree with Plan of Care Patient           Patient will benefit from skilled therapeutic intervention in order to improve the following deficits and impairments:  Abnormal gait,Decreased activity tolerance,Decreased balance,Decreased mobility,Decreased range of motion,Decreased strength,Difficulty walking,Hypomobility,Postural dysfunction,Improper body mechanics,Pain  Visit Diagnosis: Pain in right hip  Muscle weakness (generalized)  Difficulty in walking, not elsewhere classified     Problem List Patient Active Problem List   Diagnosis Date Noted  . Atherosclerosis of aorta ([place]) 01/09/2020  . Stage 2 moderate COPD by GOLD classification ([place]) 01/09/2020  . HSV (herpes simplex virus) infection 01/09/2020  . BCC (basal cell carcinoma), face 10/12/2017  .  Environmental and seasonal allergies 10/30/2014  . Psoriasis vulgaris 10/30/2014  . Essential (primary) hypertension 10/30/2014  . Gastro-esophageal reflux disease without esophagitis 10/30/2014  . Hot flash, menopausal 10/30/2014  . Hyperlipidemia, mixed 10/30/2014  . History of cancer of left breast 10/30/2014  . Bronchiectasis [facility]) 09/24/2013   Myles Gip PT, DPT [phone]  06/04/2020, 11:53 AM  [place] [facility] [address] [place], [place], [address] Phone: [phone]   Fax:  [phone]  Name: [name] MRN: [id number] Date of Birth: 17-Aug-1933

## 2020-06-05 ENCOUNTER — Other Ambulatory Visit: Payer: Self-pay

## 2020-06-05 ENCOUNTER — Emergency Department: Payer: Medicare PPO

## 2020-06-05 ENCOUNTER — Inpatient Hospital Stay
Admission: EM | Admit: 2020-06-05 | Discharge: 2020-06-11 | DRG: 480 | Disposition: A | Discharge status: Skilled Nursing Facility | Payer: Medicare PPO | Attending: Internal Medicine | Admitting: Internal Medicine

## 2020-06-05 ENCOUNTER — Encounter
Admission: EM | Disposition: A | Discharge status: Skilled Nursing Facility | Payer: Self-pay | Source: Home / Self Care | Attending: Obstetrics and Gynecology

## 2020-06-05 ENCOUNTER — Inpatient Hospital Stay: Payer: Medicare PPO | Admitting: Certified Registered"

## 2020-06-05 ENCOUNTER — Inpatient Hospital Stay: Payer: Medicare PPO

## 2020-06-05 DIAGNOSIS — Y92009 Unspecified place in unspecified non-institutional (private) residence as the place of occurrence of the external cause: Secondary | ICD-10-CM

## 2020-06-05 DIAGNOSIS — I272 Pulmonary hypertension, unspecified: Secondary | ICD-10-CM | POA: Diagnosis present

## 2020-06-05 DIAGNOSIS — I4891 Unspecified atrial fibrillation: Secondary | ICD-10-CM | POA: Diagnosis present

## 2020-06-05 DIAGNOSIS — R0602 Shortness of breath: Secondary | ICD-10-CM

## 2020-06-05 DIAGNOSIS — I4892 Unspecified atrial flutter: Secondary | ICD-10-CM | POA: Diagnosis not present

## 2020-06-05 DIAGNOSIS — I959 Hypotension, unspecified: Secondary | ICD-10-CM | POA: Diagnosis present

## 2020-06-05 DIAGNOSIS — W010XXA Fall on same level from slipping, tripping and stumbling without subsequent striking against object, initial encounter: Secondary | ICD-10-CM | POA: Diagnosis present

## 2020-06-05 DIAGNOSIS — S72002A Fracture of unspecified part of neck of left femur, initial encounter for closed fracture: Secondary | ICD-10-CM | POA: Diagnosis not present

## 2020-06-05 DIAGNOSIS — N390 Urinary tract infection, site not specified: Secondary | ICD-10-CM

## 2020-06-05 DIAGNOSIS — W19XXXA Unspecified fall, initial encounter: Secondary | ICD-10-CM | POA: Diagnosis not present

## 2020-06-05 DIAGNOSIS — K219 Gastro-esophageal reflux disease without esophagitis: Secondary | ICD-10-CM

## 2020-06-05 DIAGNOSIS — S72012A Unspecified intracapsular fracture of left femur, initial encounter for closed fracture: Secondary | ICD-10-CM | POA: Diagnosis present

## 2020-06-05 DIAGNOSIS — E039 Hypothyroidism, unspecified: Secondary | ICD-10-CM | POA: Diagnosis not present

## 2020-06-05 DIAGNOSIS — Z923 Personal history of irradiation: Secondary | ICD-10-CM

## 2020-06-05 DIAGNOSIS — Z20822 Contact with and (suspected) exposure to covid-19: Secondary | ICD-10-CM | POA: Diagnosis present

## 2020-06-05 DIAGNOSIS — M25552 Pain in left hip: Secondary | ICD-10-CM | POA: Diagnosis present

## 2020-06-05 DIAGNOSIS — J9601 Acute respiratory failure with hypoxia: Secondary | ICD-10-CM | POA: Diagnosis present

## 2020-06-05 DIAGNOSIS — G8929 Other chronic pain: Secondary | ICD-10-CM | POA: Diagnosis present

## 2020-06-05 DIAGNOSIS — J984 Other disorders of lung: Secondary | ICD-10-CM | POA: Diagnosis present

## 2020-06-05 DIAGNOSIS — Z803 Family history of malignant neoplasm of breast: Secondary | ICD-10-CM

## 2020-06-05 DIAGNOSIS — Z90711 Acquired absence of uterus with remaining cervical stump: Secondary | ICD-10-CM

## 2020-06-05 DIAGNOSIS — E785 Hyperlipidemia, unspecified: Secondary | ICD-10-CM | POA: Diagnosis not present

## 2020-06-05 DIAGNOSIS — I10 Essential (primary) hypertension: Secondary | ICD-10-CM | POA: Diagnosis not present

## 2020-06-05 DIAGNOSIS — Z7982 Long term (current) use of aspirin: Secondary | ICD-10-CM | POA: Diagnosis not present

## 2020-06-05 DIAGNOSIS — Z79899 Other long term (current) drug therapy: Secondary | ICD-10-CM | POA: Diagnosis not present

## 2020-06-05 DIAGNOSIS — J449 Chronic obstructive pulmonary disease, unspecified: Secondary | ICD-10-CM | POA: Diagnosis present

## 2020-06-05 DIAGNOSIS — S72002S Fracture of unspecified part of neck of left femur, sequela: Secondary | ICD-10-CM | POA: Diagnosis not present

## 2020-06-05 DIAGNOSIS — Z9221 Personal history of antineoplastic chemotherapy: Secondary | ICD-10-CM | POA: Diagnosis not present

## 2020-06-05 DIAGNOSIS — R52 Pain, unspecified: Secondary | ICD-10-CM

## 2020-06-05 DIAGNOSIS — Z853 Personal history of malignant neoplasm of breast: Secondary | ICD-10-CM | POA: Diagnosis not present

## 2020-06-05 DIAGNOSIS — I480 Paroxysmal atrial fibrillation: Secondary | ICD-10-CM

## 2020-06-05 DIAGNOSIS — Z419 Encounter for procedure for purposes other than remedying health state, unspecified: Secondary | ICD-10-CM

## 2020-06-05 HISTORY — PX: HIP PINNING,CANNULATED: SHX1758

## 2020-06-05 LAB — BASIC METABOLIC PANEL
Anion gap: 10 (ref 5–15)
BUN: 33 mg/dL — ABNORMAL HIGH (ref 8–23)
CO2: 27 mmol/L (ref 22–32)
Calcium: 9.4 mg/dL (ref 8.9–10.3)
Chloride: 103 mmol/L (ref 98–111)
Creatinine, Ser: 0.7 mg/dL (ref 0.44–1.00)
GFR, Estimated: >60 mL/min (ref >60)
Glucose, Bld: 124 mg/dL — ABNORMAL HIGH (ref 70–99)
Potassium: 4 mmol/L (ref 3.5–5.1)
Sodium: 140 mmol/L (ref 135–145)

## 2020-06-05 LAB — CBC
HCT: 41.7 % (ref 36.0–46.0)
Hemoglobin: 14 g/dL (ref 12.0–15.0)
MCH: 30.4 pg (ref 26.0–34.0)
MCHC: 33.6 g/dL (ref 30.0–36.0)
MCV: 90.5 fL (ref 80.0–100.0)
Platelets: 197 10*3/uL (ref 150–400)
RBC: 4.61 MIL/uL (ref 3.87–5.11)
RDW: 13.7 % (ref 11.5–15.5)
WBC: 21.1 10*3/uL — ABNORMAL HIGH (ref 4.0–10.5)
nRBC: 0 % (ref 0.0–0.2)

## 2020-06-05 LAB — RESP PANEL BY RT-PCR (FLU A&B, COVID) ARPGX2
Influenza A by PCR: NEGATIVE
Influenza B by PCR: NEGATIVE
SARS Coronavirus 2 by RT PCR: NEGATIVE

## 2020-06-05 LAB — SAMPLE TO BLOOD BANK

## 2020-06-05 SURGERY — FIXATION, FEMUR, NECK, PERCUTANEOUS, USING SCREW
Anesthesia: General | Site: Hip | Laterality: Left

## 2020-06-05 MED ORDER — CEFAZOLIN SODIUM-DEXTROSE 1-4 GM/50ML-% IV SOLN
1.0000 g | Freq: Once | INTRAVENOUS | Status: AC
  Administered 2020-06-05: 2 g via INTRAVENOUS
  Filled 2020-06-05: qty 50

## 2020-06-05 MED ORDER — FENTANYL CITRATE (PF) 100 MCG/2ML IJ SOLN: 25.0000 ug | INTRAMUSCULAR | Status: DC | PRN

## 2020-06-05 MED ORDER — ENOXAPARIN SODIUM 40 MG/0.4ML ~~LOC~~ SOLN
40.0000 mg | SUBCUTANEOUS | Status: DC
  Administered 2020-06-06 – 2020-06-09 (×4): 40 mg via SUBCUTANEOUS
  Filled 2020-06-05 (×4): qty 0.4

## 2020-06-05 MED ORDER — FLUTICASONE PROPIONATE 50 MCG/ACT NA SUSP
1.0000 | Freq: Every day | NASAL | Status: DC
  Administered 2020-06-05 – 2020-06-11 (×7): 1 via NASAL
  Filled 2020-06-05: qty 16

## 2020-06-05 MED ORDER — MAGNESIUM CITRATE PO SOLN
1.0000 | Freq: Once | ORAL | Status: DC | PRN
  Filled 2020-06-05: qty 296

## 2020-06-05 MED ORDER — CALCIUM CARBONATE-VITAMIN D 500-200 MG-UNIT PO TABS
1.0000 | ORAL_TABLET | Freq: Every day | ORAL | Status: DC
  Administered 2020-06-06 – 2020-06-11 (×6): 1 via ORAL
  Filled 2020-06-05 (×8): qty 1

## 2020-06-05 MED ORDER — NEOMYCIN-POLYMYXIN B GU 40-200000 IR SOLN
Status: DC | PRN
  Administered 2020-06-05: 2 mL

## 2020-06-05 MED ORDER — ONDANSETRON HCL 4 MG/2ML IJ SOLN
INTRAMUSCULAR | Status: AC
  Filled 2020-06-05: qty 2

## 2020-06-05 MED ORDER — FENTANYL CITRATE (PF) 100 MCG/2ML IJ SOLN
INTRAMUSCULAR | Status: AC
  Administered 2020-06-05: 50 ug via INTRAVENOUS
  Filled 2020-06-05: qty 2

## 2020-06-05 MED ORDER — GABAPENTIN 100 MG PO CAPS
100.0000 mg | ORAL_CAPSULE | Freq: Every day | ORAL | Status: DC
  Administered 2020-06-05 – 2020-06-11 (×7): 100 mg via ORAL
  Filled 2020-06-05 (×7): qty 1

## 2020-06-05 MED ORDER — PROPOFOL 10 MG/ML IV BOLUS
INTRAVENOUS | Status: DC | PRN
  Administered 2020-06-05: 120 mg via INTRAVENOUS

## 2020-06-05 MED ORDER — ONDANSETRON HCL 4 MG/2ML IJ SOLN
INTRAMUSCULAR | Status: DC | PRN
  Administered 2020-06-05: 4 mg via INTRAVENOUS

## 2020-06-05 MED ORDER — METOPROLOL TARTRATE 25 MG PO TABS
12.5000 mg | ORAL_TABLET | Freq: Every day | ORAL | Status: DC
  Administered 2020-06-05 – 2020-06-09 (×5): 12.5 mg via ORAL
  Filled 2020-06-05 (×3): qty 1
  Filled 2020-06-05: qty 0.5
  Filled 2020-06-05: qty 1

## 2020-06-05 MED ORDER — METHOTREXATE 2.5 MG PO TABS: 5.0000 mg | ORAL_TABLET | ORAL | Status: DC

## 2020-06-05 MED ORDER — ALUM & MAG HYDROXIDE-SIMETH 200-200-20 MG/5ML PO SUSP
30.0000 mL | ORAL | Status: DC | PRN
Start: 2020-06-05 — End: 2020-06-11

## 2020-06-05 MED ORDER — BISACODYL 10 MG RE SUPP: 10.0000 mg | Freq: Every day | RECTAL | Status: DC | PRN

## 2020-06-05 MED ORDER — METOCLOPRAMIDE HCL 5 MG/ML IJ SOLN
5.0000 mg | Freq: Three times a day (TID) | INTRAMUSCULAR | Status: DC | PRN
Start: 2020-06-05 — End: 2020-06-11
  Filled 2020-06-05: qty 2

## 2020-06-05 MED ORDER — MORPHINE SULFATE (PF) 2 MG/ML IV SOLN
0.5000 mg | INTRAVENOUS | Status: DC | PRN
  Administered 2020-06-05: 0.5 mg via INTRAVENOUS
  Filled 2020-06-05: qty 1

## 2020-06-05 MED ORDER — FENTANYL CITRATE (PF) 100 MCG/2ML IJ SOLN
25.0000 ug | INTRAMUSCULAR | Status: DC | PRN
Start: 2020-06-05 — End: 2020-06-05
  Administered 2020-06-05 (×2): 25 ug via INTRAVENOUS

## 2020-06-05 MED ORDER — ONDANSETRON HCL 4 MG/2ML IJ SOLN
INTRAMUSCULAR | Status: AC
  Administered 2020-06-05: 4 mg via INTRAVENOUS
  Filled 2020-06-05: qty 2

## 2020-06-05 MED ORDER — ADULT MULTIVITAMIN W/MINERALS CH
1.0000 | ORAL_TABLET | Freq: Every day | ORAL | Status: DC
  Administered 2020-06-06 – 2020-06-11 (×6): 1 via ORAL
  Filled 2020-06-05 (×6): qty 1

## 2020-06-05 MED ORDER — GLYCOPYRROLATE 0.2 MG/ML IJ SOLN
INTRAMUSCULAR | Status: AC
  Filled 2020-06-05: qty 1

## 2020-06-05 MED ORDER — LIDOCAINE HCL (PF) 2 % IJ SOLN
INTRAMUSCULAR | Status: AC
  Filled 2020-06-05: qty 5

## 2020-06-05 MED ORDER — ONDANSETRON HCL 4 MG/2ML IJ SOLN: 4.0000 mg | Freq: Once | INTRAMUSCULAR | Status: DC | PRN

## 2020-06-05 MED ORDER — CEFAZOLIN SODIUM-DEXTROSE 1-4 GM/50ML-% IV SOLN
1.0000 g | Freq: Three times a day (TID) | INTRAVENOUS | Status: DC
  Filled 2020-06-05 (×3): qty 50

## 2020-06-05 MED ORDER — METHOCARBAMOL 1000 MG/10ML IJ SOLN
500.0000 mg | Freq: Four times a day (QID) | INTRAVENOUS | Status: DC | PRN
  Filled 2020-06-05: qty 5

## 2020-06-05 MED ORDER — FOLIC ACID 1 MG PO TABS
1.0000 mg | ORAL_TABLET | Freq: Every day | ORAL | Status: DC | PRN
  Administered 2020-06-07: 1 mg via ORAL
  Filled 2020-06-05: qty 1

## 2020-06-05 MED ORDER — METHOCARBAMOL 500 MG PO TABS
500.0000 mg | ORAL_TABLET | Freq: Four times a day (QID) | ORAL | Status: DC | PRN
  Administered 2020-06-05: 500 mg via ORAL
  Filled 2020-06-05 (×4): qty 1

## 2020-06-05 MED ORDER — METHOTREXATE 2.5 MG PO TABS
10.0000 mg | ORAL_TABLET | ORAL | Status: DC
  Administered 2020-06-09: 10 mg via ORAL
  Filled 2020-06-05: qty 4

## 2020-06-05 MED ORDER — DOCUSATE SODIUM 100 MG PO CAPS
100.0000 mg | ORAL_CAPSULE | Freq: Two times a day (BID) | ORAL | Status: DC
  Administered 2020-06-05 – 2020-06-11 (×5): 100 mg via ORAL
  Filled 2020-06-05 (×9): qty 1

## 2020-06-05 MED ORDER — SODIUM CHLORIDE 0.9 % IV SOLN: INTRAVENOUS | Status: DC

## 2020-06-05 MED ORDER — SENNA 8.6 MG PO TABS
1.0000 | ORAL_TABLET | Freq: Two times a day (BID) | ORAL | Status: DC
  Administered 2020-06-05 – 2020-06-06 (×2): 8.6 mg via ORAL
  Filled 2020-06-05 (×3): qty 1

## 2020-06-05 MED ORDER — DEXAMETHASONE SODIUM PHOSPHATE 10 MG/ML IJ SOLN
INTRAMUSCULAR | Status: AC
  Filled 2020-06-05: qty 1

## 2020-06-05 MED ORDER — ONDANSETRON HCL 4 MG/2ML IJ SOLN
4.0000 mg | Freq: Four times a day (QID) | INTRAMUSCULAR | Status: DC | PRN
  Administered 2020-06-05 – 2020-06-09 (×3): 4 mg via INTRAVENOUS
  Filled 2020-06-05 (×3): qty 2

## 2020-06-05 MED ORDER — FENTANYL CITRATE (PF) 100 MCG/2ML IJ SOLN
INTRAMUSCULAR | Status: AC
  Filled 2020-06-05: qty 2

## 2020-06-05 MED ORDER — NEOMYCIN-POLYMYXIN B GU 40-200000 IR SOLN
Status: AC
  Filled 2020-06-05: qty 2

## 2020-06-05 MED ORDER — METOCLOPRAMIDE HCL 10 MG PO TABS: 5.0000 mg | ORAL_TABLET | Freq: Three times a day (TID) | ORAL | Status: DC | PRN

## 2020-06-05 MED ORDER — ASCORBIC ACID 500 MG PO TABS
500.0000 mg | ORAL_TABLET | Freq: Every day | ORAL | Status: DC
  Administered 2020-06-06 – 2020-06-11 (×6): 500 mg via ORAL
  Filled 2020-06-05 (×7): qty 1

## 2020-06-05 MED ORDER — HYDROCHLOROTHIAZIDE 12.5 MG PO CAPS
12.5000 mg | ORAL_CAPSULE | Freq: Every day | ORAL | Status: DC
  Administered 2020-06-05 – 2020-06-09 (×5): 12.5 mg via ORAL
  Filled 2020-06-05 (×5): qty 1

## 2020-06-05 MED ORDER — FENTANYL CITRATE (PF) 100 MCG/2ML IJ SOLN
INTRAMUSCULAR | Status: DC | PRN
  Administered 2020-06-05 (×2): 25 ug via INTRAVENOUS

## 2020-06-05 MED ORDER — MAGNESIUM HYDROXIDE 400 MG/5ML PO SUSP
30.0000 mL | Freq: Every day | ORAL | Status: DC | PRN
  Administered 2020-06-06: 30 mL via ORAL
  Filled 2020-06-05: qty 30

## 2020-06-05 MED ORDER — OMEGA-3-ACID ETHYL ESTERS 1 G PO CAPS
1.0000 g | ORAL_CAPSULE | Freq: Every day | ORAL | Status: DC
  Administered 2020-06-06 – 2020-06-11 (×6): 1 g via ORAL
  Filled 2020-06-05 (×7): qty 1

## 2020-06-05 MED ORDER — ATORVASTATIN CALCIUM 10 MG PO TABS
10.0000 mg | ORAL_TABLET | Freq: Every day | ORAL | Status: DC
  Administered 2020-06-05 – 2020-06-10 (×6): 10 mg via ORAL
  Filled 2020-06-05 (×6): qty 1

## 2020-06-05 MED ORDER — SODIUM CHLORIDE 0.9 % IV SOLN
1.0000 g | Freq: Three times a day (TID) | INTRAVENOUS | Status: DC
  Administered 2020-06-05 – 2020-06-06 (×2): 1 g via INTRAVENOUS
  Filled 2020-06-05 (×4): qty 10

## 2020-06-05 MED ORDER — PHENYLEPHRINE HCL (PRESSORS) 10 MG/ML IV SOLN
INTRAVENOUS | Status: DC | PRN
  Administered 2020-06-05 (×4): 100 ug via INTRAVENOUS

## 2020-06-05 MED ORDER — MENTHOL 3 MG MT LOZG
1.0000 | LOZENGE | OROMUCOSAL | Status: DC | PRN
  Filled 2020-06-05: qty 9

## 2020-06-05 MED ORDER — DEXAMETHASONE SODIUM PHOSPHATE 10 MG/ML IJ SOLN
INTRAMUSCULAR | Status: DC | PRN
  Administered 2020-06-05: 5 mg via INTRAVENOUS

## 2020-06-05 MED ORDER — LACTATED RINGERS IV SOLN: INTRAVENOUS | Status: DC | PRN

## 2020-06-05 MED ORDER — FAMOTIDINE 20 MG PO TABS
20.0000 mg | ORAL_TABLET | Freq: Two times a day (BID) | ORAL | Status: DC
  Administered 2020-06-05 – 2020-06-11 (×12): 20 mg via ORAL
  Filled 2020-06-05 (×12): qty 1

## 2020-06-05 MED ORDER — VITAMIN B-12 1000 MCG PO TABS
1000.0000 ug | ORAL_TABLET | Freq: Every day | ORAL | Status: DC
  Administered 2020-06-06 – 2020-06-11 (×6): 1000 ug via ORAL
  Filled 2020-06-05 (×6): qty 1

## 2020-06-05 MED ORDER — PROPOFOL 10 MG/ML IV BOLUS
INTRAVENOUS | Status: AC
  Filled 2020-06-05: qty 20

## 2020-06-05 MED ORDER — ACETAMINOPHEN 325 MG PO TABS
650.0000 mg | ORAL_TABLET | Freq: Four times a day (QID) | ORAL | Status: DC | PRN
  Administered 2020-06-08: 650 mg via ORAL
  Filled 2020-06-05: qty 2

## 2020-06-05 MED ORDER — PHENOL 1.4 % MT LIQD
1.0000 | OROMUCOSAL | Status: DC | PRN
  Filled 2020-06-05: qty 177

## 2020-06-05 MED ORDER — HYDROCODONE-ACETAMINOPHEN 5-325 MG PO TABS
1.0000 | ORAL_TABLET | Freq: Four times a day (QID) | ORAL | Status: DC | PRN
  Administered 2020-06-05 – 2020-06-09 (×9): 1 via ORAL
  Administered 2020-06-10: 2 via ORAL
  Administered 2020-06-10: 1 via ORAL
  Administered 2020-06-11: 2 via ORAL
  Filled 2020-06-05: qty 1
  Filled 2020-06-05 (×2): qty 2
  Filled 2020-06-05 (×9): qty 1

## 2020-06-05 MED ORDER — METOCLOPRAMIDE HCL 5 MG/ML IJ SOLN
10.0000 mg | Freq: Once | INTRAMUSCULAR | Status: AC
  Administered 2020-06-05: 10 mg via INTRAVENOUS

## 2020-06-05 MED ORDER — LORATADINE 10 MG PO TABS
10.0000 mg | ORAL_TABLET | Freq: Every day | ORAL | Status: DC
  Administered 2020-06-06 – 2020-06-11 (×5): 10 mg via ORAL
  Filled 2020-06-05 (×6): qty 1

## 2020-06-05 MED ORDER — LIDOCAINE HCL (PF) 2 % IJ SOLN
INTRAMUSCULAR | Status: DC | PRN
  Administered 2020-06-05: 50 mg

## 2020-06-05 SURGICAL SUPPLY — 34 items
BIT DRILL CANN LRG QC 5X300 (BIT) ×2 IMPLANT
CANISTER SUCT 1200ML W/VALVE (MISCELLANEOUS) ×2 IMPLANT
CHLORAPREP W/TINT 26 (MISCELLANEOUS) ×2 IMPLANT
COVER WAND RF STERILE (DRAPES) ×2 IMPLANT
DRSG OPSITE POSTOP 4X10 (GAUZE/BANDAGES/DRESSINGS) ×2 IMPLANT
DRSG TEGADERM 6X8 (GAUZE/BANDAGES/DRESSINGS) ×2 IMPLANT
ELECT REM PT RETURN 9FT ADLT (ELECTROSURGICAL) ×2
ELECTRODE REM PT RTRN 9FT ADLT (ELECTROSURGICAL) ×1 IMPLANT
GAUZE SPONGE 4X4 12PLY STRL (GAUZE/BANDAGES/DRESSINGS) ×2 IMPLANT
GAUZE XEROFORM 1X8 LF (GAUZE/BANDAGES/DRESSINGS) ×2 IMPLANT
GLOVE SURG SYN 9.0  PF PI (GLOVE) ×1
GLOVE SURG SYN 9.0 PF PI (GLOVE) ×1 IMPLANT
GLOVE SURG UNDER POLY LF SZ9 (GLOVE) ×2 IMPLANT
GOWN SRG 2XL LVL 4 RGLN SLV (GOWNS) ×1 IMPLANT
GOWN STRL NON-REIN 2XL LVL4 (GOWNS) ×1
GOWN STRL REUS W/ TWL LRG LVL3 (GOWN DISPOSABLE) ×1 IMPLANT
GOWN STRL REUS W/TWL LRG LVL3 (GOWN DISPOSABLE) ×1
GUIDEWIRE THREADED 2.8 (WIRE) ×8 IMPLANT
KIT TURNOVER KIT A (KITS) ×2 IMPLANT
MANIFOLD NEPTUNE II (INSTRUMENTS) ×2 IMPLANT
NEEDLE FILTER BLUNT 18X 1/2SAF (NEEDLE) ×1
NEEDLE FILTER BLUNT 18X1 1/2 (NEEDLE) ×1 IMPLANT
NS IRRIG 500ML POUR BTL (IV SOLUTION) ×2 IMPLANT
PACK HIP COMPR (MISCELLANEOUS) ×2 IMPLANT
SCALPEL PROTECTED #10 DISP (BLADE) ×4 IMPLANT
SCREW CANN 16 THRD/80 7.3 (Screw) ×2 IMPLANT
SCREW CANN 32 THRD/80 7.3 (Screw) ×4 IMPLANT
SCREW CANN THREADED 7.3X85 (Screw) ×2 IMPLANT
STAPLER SKIN PROX 35W (STAPLE) ×2 IMPLANT
SUT PROLENE 2 0 FS (SUTURE) ×2 IMPLANT
SUT VIC AB 2-0 SH 27 (SUTURE) ×1
SUT VIC AB 2-0 SH 27XBRD (SUTURE) ×1 IMPLANT
SYR 5ML LL (SYRINGE) ×2 IMPLANT
TAPE MICROFOAM 4IN (TAPE) ×2 IMPLANT

## 2020-06-05 NOTE — Progress Notes (Signed)
Initial Nutrition Assessment  DOCUMENTATION CODES:   Not applicable  INTERVENTION:   Once diet advanced supplement diet to meet nutrition needs as appropriate.    NUTRITION DIAGNOSIS:   Increased nutrient needs related to hip fracture as evidenced by estimated needs.  GOAL:   Patient will meet greater than or equal to 90% of their needs  MONITOR:   Diet advancement,I & O's  REASON FOR ASSESSMENT:   Consult Assessment of nutrition requirement/status  ASSESSMENT:   Pt with PMH of HTN, GERD, COPD, HLD, breast cancer s/p chemo/XRT admitted with L hip fx after fall.   Pt has been using a walker instead of her cane intermittently the last couple of months due to R hip pain.    Medications reviewed and include: vitamin C, Oscal with D, MVI with minerals, lovaza, senokot, vitamin B12  Labs reviewed    NUTRITION - FOCUSED PHYSICAL EXAM:  Pt in OR  Diet Order:   Diet Order            Diet NPO time specified  Diet effective now                 EDUCATION NEEDS:   No education needs have been identified at this time  Skin:  Skin Assessment: Reviewed RN Assessment  Last BM:  4/1  Height:   Ht Readings from Last 1 Encounters:  06/05/20 5\' 3"  (1.6 m)    Weight:   Wt Readings from Last 1 Encounters:  06/05/20 60 kg    Ideal Body Weight:  52.2 kg  BMI:  Body mass index is 23.43 kg/m.  Estimated Nutritional Needs:   Kcal:  1500-1700  Protein:  75-85 grams  Fluid:  >1.5 L/day  Lockie Pares., RD, LDN, CNSC See AMiON for contact information

## 2020-06-05 NOTE — ED Notes (Signed)
Pt to CT

## 2020-06-05 NOTE — ED Provider Notes (Addendum)
[facility] Emergency Department Provider Note  Time seen: 8:45 AM  I have reviewed the triage vital signs and the nursing notes.   HISTORY  Chief Complaint Fall   HPI [name] is a 87 y.o. female with a past medical history of gastric reflux, hypertension, hyperlipidemia, presents to the emergency department after a fall.  According to the patient she stumbled and fell this morning.  Denies LOC.  Denies dizziness or weakness.  Patient's main complaint is of left hip and left shoulder pain.  Worse with movement.  Patient states she was able to get up after the fall with help from her husband to get to the restroom.   Past Medical History:  Diagnosis Date  . Allergy   . Bronchiectasis ([place])   . Cancer [facility])    left breast cancer   . GERD (gastroesophageal reflux disease)   . Hyperlipemia   . Hypertension   . Intractable nausea and vomiting 06/20/2018    Patient Active Problem List   Diagnosis Date Noted  . Atherosclerosis of aorta ([place]) 01/09/2020  . Stage 2 moderate COPD by GOLD classification ([place]) 01/09/2020  . HSV (herpes simplex virus) infection 01/09/2020  . BCC (basal cell carcinoma), face 10/12/2017  . Environmental and seasonal allergies 10/30/2014  . Psoriasis vulgaris 10/30/2014  . Essential (primary) hypertension 10/30/2014  . Gastro-esophageal reflux disease without esophagitis 10/30/2014  . Hot flash, menopausal 10/30/2014  . Hyperlipidemia, mixed 10/30/2014  . History of cancer of left breast 10/30/2014  . Bronchiectasis ([place]) 09/24/2013    Past Surgical History:  Procedure Laterality Date  . BREAST LUMPECTOMY Left 2008  . CHOLECYSTECTOMY    . PARTIAL HYSTERECTOMY      Prior to Admission medications   Medication Sig Start Date End Date Taking? Authorizing Provider  acetaminophen (TYLENOL) 325 MG tablet Take 2 tablets (650 mg total) by mouth every 6 (six) hours as needed for mild pain or headache (fever >/= 101). 06/22/18    Elgergawy, Silver Huguenin, MD  acyclovir (ZOVIRAX) 200 MG capsule Take 1 capsule (200 mg total) by mouth 2 (two) times daily as needed. Patient not taking: Reported on 02/10/2020 01/22/20   Glean Hess, MD  acyclovir ointment (ZOVIRAX) 5 % Apply 1 application topically every 3 (three) hours. Patient not taking: Reported on 02/10/2020 01/09/20   Glean Hess, MD  Ascorbic Acid (VITAMIN C) 100 MG tablet Take 100 mg by mouth daily.    [provider]  aspirin EC 81 MG tablet Take 81 mg by mouth daily.    [provider]  atorvastatin (LIPITOR) 10 MG tablet TAKE 1 TABLET(10 MG) BY MOUTH DAILY 04/07/20   Glean Hess, MD  calcium-vitamin D (CALCIUM 500/D) 500-200 MG-UNIT tablet Take 1 tablet by mouth daily.    [provider]  cetirizine (ZYRTEC) 10 MG tablet Take 10 mg by mouth daily.    [provider]  famotidine (PEPCID) 20 MG tablet Take 20 mg by mouth 2 (two) times daily. PRN reflux    [provider]  fluticasone (FLONASE) 50 MCG/ACT nasal spray Place 2 sprays into both nostrils daily as needed for allergies or rhinitis. 05/14/19   Glean Hess, MD  folic acid (FOLVITE) 1 MG tablet Take 1 mg by mouth daily as needed (for supplement).     [provider]  hydrochlorothiazide (MICROZIDE) 12.5 MG capsule TAKE 1 CAPSULE(12.5 MG) BY MOUTH DAILY 02/11/20   Glean Hess, MD  methotrexate [facility]) 2.5  MG tablet Take 5 mg by mouth once a week.     [provider]  metoprolol tartrate (LOPRESSOR) 25 MG tablet TAKE 1/2 TABLET BY MOUTH EVERY DAY 03/03/20   Glean Hess, MD  Multiple Vitamins-Minerals (MULTIVITAMIN ADULT PO) Take 1 tablet by mouth daily.     [provider]  Omega-3 Fatty Acids (FISH OIL) 1000 MG CAPS Take by mouth.    [provider]  triamcinolone cream (KENALOG) 0.1 % APP EXT AA BID PRN 07/26/18   [provider]  vitamin B-12 (CYANOCOBALAMIN) 1000 MCG tablet Take 1,000 mcg by  mouth daily.    [provider]    No Known Allergies  Family History  Problem Relation Age of Onset  . Breast cancer Sister     Social History Social History   Tobacco Use  . Smoking status: Never Smoker  . Smokeless tobacco: Never Used  Vaping Use  . Vaping Use: Never used  Substance Use Topics  . Alcohol use: No  . Drug use: No    Review of Systems Constitutional: Negative for fever. Cardiovascular: Negative for chest pain. Respiratory: Negative for shortness of breath. Gastrointestinal: Negative for abdominal pain, vomiting Musculoskeletal: Moderate dull left shoulder pain worse with movement.  Mild dull left hip pain worse with movement. Skin: Negative for skin complaints  Neurological: Negative for headache All other ROS negative  ____________________________________________   PHYSICAL EXAM:  VITAL SIGNS: ED Triage Vitals  Enc Vitals Group     BP 06/05/20 0842 (!) 148/67     Pulse Rate 06/05/20 0842 92     Resp 06/05/20 0842 14     Temp --      Temp src --      SpO2 06/05/20 0842 (!) 84 %     Weight 06/05/20 0843 132 lb 4.4 oz (60 kg)     Height 06/05/20 0843 5\' 3"  (1.6 m)     Head Circumference --      Peak Flow --      Pain Score 06/05/20 0842 5     Pain Loc --      Pain Edu? --      Excl. in [place]? --    Constitutional: Alert and oriented. Well appearing and in no distress. Eyes: Normal exam ENT      Head: Normocephalic and atraumatic.      Mouth/Throat: Mucous membranes are moist. Cardiovascular: Normal rate, regular rhythm.  Respiratory: Normal respiratory effort without tachypnea nor retractions. Breath sounds are clear Gastrointestinal: Soft and nontender. No distention.   Musculoskeletal: Moderate tenderness to left shoulder, worse with range of motion, neurovascularly intact distally.  Mild tenderness to the left hip with range of motion.  Neurovascular intact distally. Neurologic:  Normal speech and language. No gross focal  neurologic deficits  Skin:  Skin is warm, dry and intact.  Psychiatric: Mood and affect are normal.  ____________________________________________     RADIOLOGY  Left subcapital femoral neck fracture Chest x-ray negative. Shoulder x-ray negative. CT scan head is negative for acute abnormality.  EKG viewed and interpreted by myself shows a sinus rhythm at 102 bpm the narrow QRS, normal axis, normal intervals, nonspecific but no concerning ST changes. ____________________________________________   INITIAL IMPRESSION / ASSESSMENT AND PLAN / ED COURSE  Pertinent labs & imaging results that were available during my care of the patient were reviewed by me and considered in my medical decision making (see chart for details).   Patient presents emergency department after  mechanical fall at home with left shoulder left hip pain.  Patient is mildly hypoxic 88% on room air during my evaluation however patient did receive fentanyl by EMS.  We will place on 2 L nasal cannula and obtain a chest x-ray as a precaution.  We will obtain x-rays of the shoulder and the hip, given the patient's age we also obtain a CT scan of the head.  X-rays unfortunately show a left subcapital fracture.  Spoke to Dr. Rudene Christians of orthopedics who will plan on fixing possibly later this afternoon.  Spoke to the hospitalist for admission.  Clearance labs have been ordered for the patient.  [name] was evaluated in Emergency Department on 06/05/2020 for the symptoms described in the history of present illness. She was evaluated in the context of the global COVID-19 pandemic, which necessitated consideration that the patient might be at risk for infection with the SARS-CoV-2 virus that causes COVID-19. Institutional protocols and algorithms that pertain to the evaluation of patients at risk for COVID-19 are in a state of rapid change based on information released by regulatory bodies including the CDC and federal and state  organizations. These policies and algorithms were followed during the patient's care in the ED.  ____________________________________________   FINAL CLINICAL IMPRESSION(S) / ED DIAGNOSES  Fall Left hip fracture   Harvest Dark, MD 06/05/20 1006    Harvest Dark, MD 06/05/20 [phone]

## 2020-06-05 NOTE — Transfer of Care (Signed)
Immediate Anesthesia Transfer of Care Note  Patient: [name]  Procedure(s) Performed: CANNULATED HIP PINNING (Left Hip)  Patient Location: PACU  Anesthesia Type:General  Level of Consciousness: sedated  Airway & Oxygen Therapy: Patient Spontanous Breathing and Patient connected to face mask oxygen  Post-op Assessment: Report given to RN and Post -op Vital signs reviewed and stable  Post vital signs: Reviewed  Last Vitals:  Vitals Value Taken Time  BP    Temp    Pulse 103 06/05/20 1544  Resp 11 06/05/20 1544  SpO2 100 % 06/05/20 1544  Vitals shown include unvalidated device data.  Last Pain:  Vitals:   06/05/20 1318  TempSrc: Temporal  PainSc: 5          Complications: No complications documented.

## 2020-06-05 NOTE — Anesthesia Preprocedure Evaluation (Signed)
Anesthesia Evaluation  Patient identified by MRN, date of birth, ID band Patient awake    Reviewed: Allergy & Precautions, H&P , NPO status , Patient's Chart, lab work & pertinent test results, reviewed documented beta blocker date and time   Airway Mallampati: II   Neck ROM: full    Dental  (+) Poor Dentition   Pulmonary neg pulmonary ROS, COPD,    Pulmonary exam normal        Cardiovascular Exercise Tolerance: Poor hypertension, On Medications negative cardio ROS Normal cardiovascular exam Rhythm:regular Rate:Normal     Neuro/Psych negative neurological ROS  negative psych ROS   GI/Hepatic Neg liver ROS, GERD  Medicated,  Endo/Other  negative endocrine ROS  Renal/GU negative Renal ROS  negative genitourinary   Musculoskeletal   Abdominal   Peds  Hematology negative hematology ROS (+)   Anesthesia Other Findings Past Medical History: No date: Allergy No date: Bronchiectasis ([place]) No date: Cancer [facility])     Comment:  left breast cancer  No date: GERD (gastroesophageal reflux disease) No date: Hyperlipemia No date: Hypertension 06/20/2018: Intractable nausea and vomiting Past Surgical History: 2008: BREAST LUMPECTOMY; Left No date: CHOLECYSTECTOMY No date: PARTIAL HYSTERECTOMY BMI    Body Mass Index: 23.43 kg/m     Reproductive/Obstetrics negative OB ROS                             Anesthesia Physical Anesthesia Plan  ASA: III and emergent  Anesthesia Plan: General and Spinal   Post-op Pain Management:    Induction:   PONV Risk Score and Plan:   Airway Management Planned:   Additional Equipment:   Intra-op Plan:   Post-operative Plan:   Informed Consent: I have reviewed the patients History and Physical, chart, labs and discussed the procedure including the risks, benefits and alternatives for the proposed anesthesia with the patient or authorized representative  who has indicated his/her understanding and acceptance.     Dental Advisory Given  Plan Discussed with: CRNA  Anesthesia Plan Comments:         Anesthesia Quick Evaluation

## 2020-06-05 NOTE — Op Note (Signed)
06/05/2020  3:58 PM  PATIENT:  [name]  88 y.o. female  PRE-OPERATIVE DIAGNOSIS:  Left Hip Fracture impacted femoral neck  POST-OPERATIVE DIAGNOSIS:  Left Hip Fracture impacted femoral neck  PROCEDURE:  Procedure(s): CANNULATED HIP PINNING (Left)  SURGEON: Laurene Footman, MD  ASSISTANTS: None  ANESTHESIA:   general  EBL:  Total I/O In: 500 [I.V.:400; IV Piggyback:100] Out: 100 [Blood:100]  BLOOD ADMINISTERED:none  DRAINS: none   LOCAL MEDICATIONS USED:  NONE  SPECIMEN:  No Specimen  DISPOSITION OF SPECIMEN:  N/A  COUNTS:  YES  TOURNIQUET:  * No tourniquets in log *  IMPLANTS: 7.3 Synthes cannulated screws x4  DICTATION: .Dragon Dictation patient was brought the operating room and after adequate general anesthesia was obtained she was placed on the fracture table with right leg in the well-leg holder left leg in the traction boot.  C arm was brought in and good visualization and AP and lateral projections was obtained.  The hip was then prepped and draped in the usual sterile fashion using the barrier drape method.  After patient identification timeout procedures were completed approximately 1 inch incision was made laterally soft tissue spread and first guidewire was inserted.  Based on the this posterior inferior position 3 additional pins were placed in diamond array.  These were measured drilled tapped and then the screws placed with care being taken not to leave any hardware within the joint.  The permanent views were obtained in both AP and lateral projections.  Following this the guidewires were removed and the wound irrigated with 2-0 Vicryl to close the skin subcutaneously followed by skin staples Xeroform and honeycomb dressing.  PLAN OF CARE: Admit to inpatient   PATIENT DISPOSITION:  PACU - hemodynamically stable.

## 2020-06-05 NOTE — ED Triage Notes (Addendum)
BIB [facility] from home due to mechanical fall. Pt reports right hip pain causing her to use walker intermittently for months. Pt states pain in right hip caused her to fall and now has left hip pain and left shoulder pain. No head or neck pain. Denies hitting head. Denies LOC. Pt able to move all extremities and has normal sensation to all extremities. Palpable pulses X all extremities. BP 138/73, HR 80 with EMS. 16mcg IM fentanyl given PTA. Alert and oriented X4. EDP at bedside.

## 2020-06-05 NOTE — Consult Note (Signed)
Reason for Consult: Left hip fracture Referring Physician: Dr. Vickki Hearing is an 87 y.o. female.  HPI: Patient is a active 87 year old who slipped and stumbled at home.  She has been having some chronic right hip pain is been going to physical therapy.  She lost her pain and landed on her left side.  Denied loss of consciousness.  She has acute left hip pain is brought to emergency room and found to have an impacted femoral neck fracture.  Past Medical History:  Diagnosis Date  . Allergy   . Bronchiectasis ([place])   . Cancer [facility])    left breast cancer   . GERD (gastroesophageal reflux disease)   . Hyperlipemia   . Hypertension   . Intractable nausea and vomiting 06/20/2018    Past Surgical History:  Procedure Laterality Date  . BREAST LUMPECTOMY Left 2008  . CHOLECYSTECTOMY    . PARTIAL HYSTERECTOMY      Family History  Problem Relation Age of Onset  . Breast cancer Sister     Social History:  reports that she has never smoked. She has never used smokeless tobacco. She reports that she does not drink alcohol and does not use drugs.  Allergies: No Known Allergies  Medications: I have reviewed the patient's current medications.  Results for orders placed or performed during the hospital encounter of 06/05/20 (from the past 48 hour(s))  CBC     Status: Abnormal   Collection Time: 06/05/20  9:54 AM  Result Value Ref Range   WBC 21.1 (H) 4.0 - 10.5 K/uL   RBC 4.61 3.87 - 5.11 MIL/uL   Hemoglobin 14.0 12.0 - 15.0 g/dL   HCT 41.7 36.0 - 46.0 %   MCV 90.5 80.0 - 100.0 fL   MCH 30.4 26.0 - 34.0 pg   MCHC 33.6 30.0 - 36.0 g/dL   RDW 13.7 11.5 - 15.5 %   Platelets 197 150 - 400 K/uL   nRBC 0.0 0.0 - 0.2 %    Comment: Performed at [facility], [address] [place], [place] [address]  Basic metabolic panel     Status: Abnormal   Collection Time: 06/05/20  9:54 AM  Result Value Ref Range   Sodium 140 135 - 145 mmol/L   Potassium 4.0 3.5 - 5.1 mmol/L    Chloride 103 98 - 111 mmol/L   CO2 27 22 - 32 mmol/L   Glucose, Bld 124 (H) 70 - 99 mg/dL    Comment: Glucose reference range applies only to samples taken after fasting for at least 8 hours.   BUN 33 (H) 8 - 23 mg/dL   Creatinine, Ser 0.70 0.44 - 1.00 mg/dL   Calcium 9.4 8.9 - 10.3 mg/dL   GFR, Estimated >60 >60 mL/min    Comment: (NOTE) Calculated using the CKD-EPI Creatinine Equation (2021)    Anion gap 10 5 - 15    Comment: Performed at [facility], [address], [place], [place] [address]  Resp Panel by RT-PCR (Flu A&B, Covid) Nasopharyngeal Swab     Status: None   Collection Time: 06/05/20  9:54 AM   Specimen: Nasopharyngeal Swab; Nasopharyngeal(NP) swabs in vial transport medium  Result Value Ref Range   SARS Coronavirus 2 by RT PCR NEGATIVE NEGATIVE    Comment: (NOTE) SARS-CoV-2 target nucleic acids are NOT DETECTED.  The SARS-CoV-2 RNA is generally detectable in upper respiratory specimens during the acute phase of infection. The lowest concentration of SARS-CoV-2 viral copies  this assay can detect is 138 copies/mL. A negative result does not preclude SARS-Cov-2 infection and should not be used as the sole basis for treatment or other patient management decisions. A negative result may occur with  improper specimen collection/handling, submission of specimen other than nasopharyngeal swab, presence of viral mutation(s) within the areas targeted by this assay, and inadequate number of viral copies(<138 copies/mL). A negative result must be combined with clinical observations, patient history, and epidemiological information. The expected result is Negative.  Fact Sheet for Patients:  EntrepreneurPulse.com.au  Fact Sheet for Healthcare Providers:  IncredibleEmployment.be  This test is no t yet approved or cleared by the [place] FDA and  has been authorized for detection and/or diagnosis of SARS-CoV-2 by FDA under  an Emergency Use Authorization (EUA). This EUA will remain  in effect (meaning this test can be used) for the duration of the COVID-19 declaration under Section 564(b)(1) of the Act, 21 U.S.C.section 360bbb-3(b)(1), unless the authorization is terminated  or revoked sooner.       Influenza A by PCR NEGATIVE NEGATIVE   Influenza B by PCR NEGATIVE NEGATIVE    Comment: (NOTE) The Xpert Xpress SARS-CoV-2/FLU/RSV plus assay is intended as an aid in the diagnosis of influenza from Nasopharyngeal swab specimens and should not be used as a sole basis for treatment. Nasal washings and aspirates are unacceptable for Xpert Xpress SARS-CoV-2/FLU/RSV testing.  Fact Sheet for Patients: EntrepreneurPulse.com.au  Fact Sheet for Healthcare Providers: IncredibleEmployment.be  This test is not yet approved or cleared by the [place] FDA and has been authorized for detection and/or diagnosis of SARS-CoV-2 by FDA under an Emergency Use Authorization (EUA). This EUA will remain in effect (meaning this test can be used) for the duration of the COVID-19 declaration under Section 564(b)(1) of the Act, 21 U.S.C. section 360bbb-3(b)(1), unless the authorization is terminated or revoked.  Performed at [facility], [address] [place], [place] [address]   Sample to Blood Bank     Status: None   Collection Time: 06/05/20  9:56 AM  Result Value Ref Range   Blood Bank Specimen SAMPLE AVAILABLE FOR TESTING    Sample Expiration      06/08/2020,2359 Performed at [facility], [address] [place], [place] [address]     DG Chest 1 View  Result Date: 06/05/2020 CLINICAL DATA:  Fall, pain, left hip fracture EXAM: CHEST  1 VIEW COMPARISON:  06/20/2018 FINDINGS: Normal heart size and vascularity. Aorta atherosclerotic. Similar mild hyperinflation and apical calcified scarring. No superimposed pneumonia, collapse or consolidation. Negative for  edema, effusion or pneumothorax. Trachea midline. No acute osseous finding. IMPRESSION: Stable chronic chest findings.  No interval change or acute process Aortic Atherosclerosis (ICD10-I70.0). Electronically Signed   By: Jerilynn Mages.  Shick M.D.   On: 06/05/2020 09:37   CT Head Wo Contrast  Result Date: 06/05/2020 CLINICAL DATA:  Head trauma, minor. Additional history provided: Fall this morning. EXAM: CT HEAD WITHOUT CONTRAST TECHNIQUE: Contiguous axial images were obtained from the base of the skull through the vertex without intravenous contrast. COMPARISON:  No pertinent prior exams available for comparison. FINDINGS: Brain: Mild cerebral atrophy. Mild ill-defined hypoattenuation within the cerebral white matter is nonspecific, but compatible with chronic small vessel ischemic disease. Prominent perivascular space versus chronic lacunar infarct within the inferior right lentiform nucleus (series 3, image 12) (series 4, image 27). There is no acute intracranial hemorrhage. No demarcated cortical infarct. No extra-axial fluid collection. No evidence of intracranial mass. No  midline shift. Vascular: No hyperdense vessel.  Atherosclerotic calcifications. Skull: No evidence of calvarial fracture. 15 mm lucency within the left parietal calvarium with involvement of the inner table, likely reflecting a prominent venous [place]. Sinuses/Orbits: Visualized orbits show no acute finding. Trace bilateral ethmoid and left sphenoid sinus mucosal thickening. IMPRESSION: No evidence of acute intracranial abnormality. Mild cerebral atrophy and chronic small vessel ischemic disease. Prominent perivascular space versus chronic lacunar infarct within the inferior right basal ganglia. Minimal bilateral ethmoid and left sphenoid sinus mucosal thickening. Electronically Signed   By: Kellie Simmering DO   On: 06/05/2020 09:20   DG Shoulder Left  Result Date: 06/05/2020 CLINICAL DATA:  Fall, left shoulder pain EXAM: LEFT SHOULDER - 2+ VIEW  COMPARISON:  01/04/2013 FINDINGS: Limited two-view exam. Bones are osteopenic. Mild degenerative changes of the left AC joint without separation. No acute osseous finding or fracture. No malalignment. Included left chest demonstrates calcified apical scarring. IMPRESSION: Osteopenia and mild degenerative changes. No acute finding by plain radiography Electronically Signed   By: Jerilynn Mages.  Shick M.D.   On: 06/05/2020 09:33   DG HIP UNILAT WITH PELVIS 2-3 VIEWS LEFT  Result Date: 06/05/2020 CLINICAL DATA:  Fall, left hip pain EXAM: DG HIP (WITH OR WITHOUT PELVIS) 2-3V LEFT COMPARISON:  None. FINDINGS: There is an acute impacted left subcapital femoral neck fracture. Bones are osteopenic. Visualized pelvis intact. Right hip unremarkable. Normal SI joints for age. IMPRESSION: Acute left hip subcapital femoral neck fracture. Electronically Signed   By: Jerilynn Mages.  Shick M.D.   On: 06/05/2020 09:35    Review of Systems Blood pressure (!) 188/86, pulse (!) 108, temperature 98.1 F (36.7 C), resp. rate 18, height 5\' 3"  (1.6 m), weight 60 kg, SpO2 94 %. Physical Exam Left leg is not shortened or externally rotated with intact pulses and able to flex extend the toes.  Skin is intact around the hip.  Logrolling not performed to prevent possible displacement of fracture Assessment/Plan: Impacted femoral neck fracture in valgus angulation amenable to hip pinning Discussed treatment options with patient hemiarthroplasty versus total joint versus pinning my condition is for hip pinning.  Risks benefits possible complications discussed.  Plan on hip pinning later today.  Should be able to mobilize and weight-bear as tolerated postoperatively.  Hessie Knows 06/05/2020, 11:44 AM

## 2020-06-05 NOTE — Anesthesia Procedure Notes (Signed)
Procedure Name: LMA Insertion Performed by: Rolla Plate, CRNA Pre-anesthesia Checklist: Patient identified, Patient being monitored, Timeout performed, Emergency Drugs available and Suction available Patient Re-evaluated:Patient Re-evaluated prior to induction Oxygen Delivery Method: Circle system utilized Preoxygenation: Pre-oxygenation with 100% oxygen Induction Type: IV induction Ventilation: Mask ventilation without difficulty LMA: LMA inserted LMA Size: 3.0 Tube type: Oral Number of attempts: 1 Placement Confirmation: positive ETCO2 and breath sounds checked- equal and bilateral Tube secured with: Tape Dental Injury: Teeth and Oropharynx as per pre-operative assessment

## 2020-06-05 NOTE — H&P (Addendum)
History and Physical    ARBELL WYCOFF IWP:[id number] DOB: [birth year] DOA: 06/05/2020  PCP: Glean Hess, MD   Patient coming from: Home  I have personally briefly reviewed patient's old medical records in [place]  Chief Complaint: Left hip pain  HPI: [name] is a 87 y.o. female with medical history significant for hypertension, GERD, COPD, dyslipidemia, history of left breast cancer status post chemo and radiation therapy who presents to the emergency room via EMS for evaluation of pain in her left hip following a fall. Patient reports chronic right hip pain and has ambulates with a cane and intermittently with a walker for the last couple of months.  Due to the pain in her right hip patient states that she lost her balance and fell landing on her left side.  She denies hitting her head or having any loss of consciousness.  She was able to get up with assistance from her husband but was unable to  bear weight on her left lower extremity due to pain.  EMS was called and patient was transported to the ER. She denied feeling dizzy or lightheaded, she denies having any chest pain, no shortness of breath, no nausea, no vomiting, no abdominal pain, no dysuria, no nocturia, no frequency, no palpitations, no diaphoresis, no constipation, no diarrhea, no headache or blurred vision. Labs show white count of 21,000, hemoglobin 14, hematocrit 41.7, MCV 90.5, RDW 13.7, platelet count 197,  Patient has pyuria Chest x-ray reviewed by me shows stable chronic chest findings.  Hyperinflated lung fields.  Left hip x-ray shows acute left hip subcapital femoral neck fracture.. Left shoulder x-ray shows osteopenia and mild degenerative changes. No acute finding by plain radiography. CT scan of the head without contrast shows no evidence of acute intracranial abnormality. Mild cerebral atrophy and chronic small vessel ischemic disease. Prominent perivascular space versus chronic lacunar infarct  within the inferior right basal ganglia. Minimal bilateral ethmoid and left sphenoid sinus mucosal thickening. Twelve-lead EKG reviewed by me shows sinus tachycardia   ED Course: Patient is an 87 year old Caucasian female who was brought in by EMS for evaluation of left hip pain following a fall.  Patient has had chronic right hip pain for months which she said affects her gait.  Due to the pain she fell this morning landing on her left side and is noted to have an acute left hip subcapital femoral fracture Patient received fentanyl for pain control by EMS. Upon arrival to the ER she was found to have room air pulse oximetry of 84% and is currently on 2 L of oxygen with improvement in her pulse oximetry to 91%.  Patient appears to have a history of COPD but does not use home oxygen. Orthopedic surgery was consulted by the ER and patient will be admitted to the hospital for further evaluation.  Review of Systems: As per HPI otherwise all other systems reviewed and negative.    Past Medical History:  Diagnosis Date  . Allergy   . Bronchiectasis ([facility])   . Cancer [facility])    left breast cancer   . GERD (gastroesophageal reflux disease)   . Hyperlipemia   . Hypertension   . Intractable nausea and vomiting 06/20/2018    Past Surgical History:  Procedure Laterality Date  . BREAST LUMPECTOMY Left 2008  . CHOLECYSTECTOMY    . PARTIAL HYSTERECTOMY       reports that she has never smoked. She has never used smokeless tobacco. She reports that  she does not drink alcohol and does not use drugs.  No Known Allergies  Family History  Problem Relation Age of Onset  . Breast cancer Sister       Prior to Admission medications   Medication Sig Start Date End Date Taking? Authorizing Provider  acetaminophen (TYLENOL) 325 MG tablet Take 2 tablets (650 mg total) by mouth every 6 (six) hours as needed for mild pain or headache (fever >/= 101). 06/22/18   Elgergawy, Silver Huguenin, MD  acyclovir (ZOVIRAX)  200 MG capsule Take 1 capsule (200 mg total) by mouth 2 (two) times daily as needed. Patient not taking: Reported on 02/10/2020 01/22/20   Glean Hess, MD  acyclovir ointment (ZOVIRAX) 5 % Apply 1 application topically every 3 (three) hours. Patient not taking: Reported on 02/10/2020 01/09/20   Glean Hess, MD  Ascorbic Acid (VITAMIN C) 100 MG tablet Take 100 mg by mouth daily.    [provider]  aspirin EC 81 MG tablet Take 81 mg by mouth daily.    [provider]  atorvastatin (LIPITOR) 10 MG tablet TAKE 1 TABLET(10 MG) BY MOUTH DAILY 04/07/20   Glean Hess, MD  calcium-vitamin D (CALCIUM 500/D) 500-200 MG-UNIT tablet Take 1 tablet by mouth daily.    [provider]  cetirizine (ZYRTEC) 10 MG tablet Take 10 mg by mouth daily.    [provider]  famotidine (PEPCID) 20 MG tablet Take 20 mg by mouth 2 (two) times daily. PRN reflux    [provider]  fluticasone (FLONASE) 50 MCG/ACT nasal spray SHAKE LIQUID AND USE 2 SPRAYS IN EACH NOSTRIL DAILY AS NEEDED FOR ALLERGIES OR RHINITIS 06/05/20   Glean Hess, MD  folic acid (FOLVITE) 1 MG tablet Take 1 mg by mouth daily as needed (for supplement).     [provider]  hydrochlorothiazide (MICROZIDE) 12.5 MG capsule TAKE 1 CAPSULE(12.5 MG) BY MOUTH DAILY 02/11/20   Glean Hess, MD  methotrexate (RHEUMATREX) 2.5 MG tablet Take 5 mg by mouth once a week.     [provider]  metoprolol tartrate (LOPRESSOR) 25 MG tablet TAKE 1/2 TABLET BY MOUTH EVERY DAY 03/03/20   Glean Hess, MD  Multiple Vitamins-Minerals (MULTIVITAMIN ADULT PO) Take 1 tablet by mouth daily.     [provider]  Omega-3 Fatty Acids (FISH OIL) 1000 MG CAPS Take by mouth.    [provider]  triamcinolone cream (KENALOG) 0.1 % APP EXT AA BID PRN 07/26/18   [provider]  vitamin B-12 (CYANOCOBALAMIN) 1000 MCG tablet Take 1,000 mcg by mouth daily.    [provider]    Physical Exam: Vitals:   06/05/20 0947 06/05/20 0951 06/05/20 1000 06/05/20 1020  BP:      Pulse: 100  (!) 104 (!) 105  Resp: (!) 22  (!) 28 18  Temp:      TempSrc:      SpO2: (!) 89% 91% 96% 96%  Weight:      Height:         Vitals:   06/05/20 0947 06/05/20 0951 06/05/20 1000 06/05/20 1020  BP:      Pulse: 100  (!) 104 (!) 105  Resp: (!) 22  (!) 28 18  Temp:      TempSrc:      SpO2: (!) 89% 91% 96% 96%  Weight:      Height:          Constitutional: Alert and oriented  x 3.  Appears uncomfortable.   HEENT:      Head: Normocephalic and atraumatic.         Eyes: PERLA, EOMI, Conjunctivae are normal. Sclera is non-icteric.       Mouth/Throat: Mucous membranes are moist.       Neck: Supple with no signs of meningismus. Cardiovascular:  Tachycardic. No murmurs, gallops, or rubs. 2+ symmetrical distal pulses are present . No JVD. No LE edema Respiratory: Respiratory effort normal .Lungs sounds clear bilaterally. No wheezes, crackles, or rhonchi.  Gastrointestinal: Soft, non tender, and non distended with positive bowel sounds.  Genitourinary: No CVA tenderness. Musculoskeletal:  Decreased range of motion in left hip . No cyanosis, or erythema of extremities. Neurologic:  Face is symmetric. Moving all extremities. No gross focal neurologic deficits  Skin: Skin is warm, dry.  No rash or ulcers Psychiatric: Mood and affect are normal   Labs on Admission: I have personally reviewed following labs and imaging studies  CBC: Recent Labs  Lab 06/05/20 0954  WBC 21.1*  HGB 14.0  HCT 41.7  MCV 90.5  PLT 759   Basic Metabolic Panel: Recent Labs  Lab 06/05/20 0954  NA 140  K 4.0  CL 103  CO2 27  GLUCOSE 124*  BUN 33*  CREATININE 0.70  CALCIUM 9.4   GFR: Estimated Creatinine Clearance: 43.3 mL/min (by C-G formula based on SCr of 0.7 mg/dL). Liver Function Tests: No results for input(s): AST, ALT, ALKPHOS, BILITOT, PROT, ALBUMIN in the last 168 hours. No  results for input(s): LIPASE, AMYLASE in the last 168 hours. No results for input(s): AMMONIA in the last 168 hours. Coagulation Profile: No results for input(s): INR, PROTIME in the last 168 hours. Cardiac Enzymes: No results for input(s): CKTOTAL, CKMB, CKMBINDEX, TROPONINI in the last 168 hours. BNP (last 3 results) No results for input(s): PROBNP in the last 8760 hours. HbA1C: No results for input(s): HGBA1C in the last 72 hours. CBG: No results for input(s): GLUCAP in the last 168 hours. Lipid Profile: No results for input(s): CHOL, HDL, LDLCALC, TRIG, CHOLHDL, LDLDIRECT in the last 72 hours. Thyroid Function Tests: No results for input(s): TSH, T4TOTAL, FREET4, T3FREE, THYROIDAB in the last 72 hours. Anemia Panel: No results for input(s): VITAMINB12, FOLATE, FERRITIN, TIBC, IRON, RETICCTPCT in the last 72 hours. Urine analysis:    Component Value Date/Time   BILIRUBINUR neg 01/09/2020 1045   PROTEINUR Negative 01/09/2020 1045   UROBILINOGEN 0.2 01/09/2020 1045   NITRITE neg 01/09/2020 1045   LEUKOCYTESUR Large (3+) (A) 01/09/2020 1045    Radiological Exams on Admission: DG Chest 1 View  Result Date: 06/05/2020 CLINICAL DATA:  Fall, pain, left hip fracture EXAM: CHEST  1 VIEW COMPARISON:  06/20/2018 FINDINGS: Normal heart size and vascularity. Aorta atherosclerotic. Similar mild hyperinflation and apical calcified scarring. No superimposed pneumonia, collapse or consolidation. Negative for edema, effusion or pneumothorax. Trachea midline. No acute osseous finding. IMPRESSION: Stable chronic chest findings.  No interval change or acute process Aortic Atherosclerosis (ICD10-I70.0). Electronically Signed   By: Jerilynn Mages.  Shick M.D.   On: 06/05/2020 09:37   CT Head Wo Contrast  Result Date: 06/05/2020 CLINICAL DATA:  Head trauma, minor. Additional history provided: Fall this morning. EXAM: CT HEAD WITHOUT CONTRAST TECHNIQUE: Contiguous axial images were obtained from the base of the skull  through the vertex without intravenous contrast. COMPARISON:  No pertinent prior exams available for comparison. FINDINGS: Brain: Mild cerebral atrophy. Mild ill-defined hypoattenuation within the cerebral white matter is  nonspecific, but compatible with chronic small vessel ischemic disease. Prominent perivascular space versus chronic lacunar infarct within the inferior right lentiform nucleus (series 3, image 12) (series 4, image 27). There is no acute intracranial hemorrhage. No demarcated cortical infarct. No extra-axial fluid collection. No evidence of intracranial mass. No midline shift. Vascular: No hyperdense vessel.  Atherosclerotic calcifications. Skull: No evidence of calvarial fracture. 15 mm lucency within the left parietal calvarium with involvement of the inner table, likely reflecting a prominent venous [place]. Sinuses/Orbits: Visualized orbits show no acute finding. Trace bilateral ethmoid and left sphenoid sinus mucosal thickening. IMPRESSION: No evidence of acute intracranial abnormality. Mild cerebral atrophy and chronic small vessel ischemic disease. Prominent perivascular space versus chronic lacunar infarct within the inferior right basal ganglia. Minimal bilateral ethmoid and left sphenoid sinus mucosal thickening. Electronically Signed   By: Kellie Simmering DO   On: 06/05/2020 09:20   DG Shoulder Left  Result Date: 06/05/2020 CLINICAL DATA:  Fall, left shoulder pain EXAM: LEFT SHOULDER - 2+ VIEW COMPARISON:  01/04/2013 FINDINGS: Limited two-view exam. Bones are osteopenic. Mild degenerative changes of the left AC joint without separation. No acute osseous finding or fracture. No malalignment. Included left chest demonstrates calcified apical scarring. IMPRESSION: Osteopenia and mild degenerative changes. No acute finding by plain radiography Electronically Signed   By: Jerilynn Mages.  Shick M.D.   On: 06/05/2020 09:33   DG HIP UNILAT WITH PELVIS 2-3 VIEWS LEFT  Result Date: 06/05/2020 CLINICAL DATA:   Fall, left hip pain EXAM: DG HIP (WITH OR WITHOUT PELVIS) 2-3V LEFT COMPARISON:  None. FINDINGS: There is an acute impacted left subcapital femoral neck fracture. Bones are osteopenic. Visualized pelvis intact. Right hip unremarkable. Normal SI joints for age. IMPRESSION: Acute left hip subcapital femoral neck fracture. Electronically Signed   By: Jerilynn Mages.  Shick M.D.   On: 06/05/2020 09:35     Assessment/Plan Principal Problem:   Fracture of femoral neck, left, closed ([place]) Active Problems:   Essential (primary) hypertension   Gastro-esophageal reflux disease without esophagitis   Stage 2 moderate COPD by GOLD classification (HCC)   Acute lower UTI    Fracture of left femoral neck Status post mechanical fall Immobilize left lower extremity Pain control Start patient on muscle relaxant Consult orthopedic surgery    Marked leukocytosis Most likely stress related from recent fracture We will send urine analysis Monitor CBC   Hypertension Continue hydrochlorothiazide and metoprolol    Dyslipidemia Continue statins and  fish oil  DVT prophylaxis: SCD Code Status: full code Family Communication: Greater than 50% of time was spent discussing patient's condition and plan of care with her and her husband at the bedside.  All questions and concerns have been addressed.  They verbalized understanding and agree with the plan. Disposition Plan: Back to previous home environment Consults called: Orthopedic surgery Status: At the time of admission, it appears that the appropriate admission status for this patient is inpatient.  This is judged to be reasonable and necessary in order to provide the required intensity of service to ensure the patient's safety given the presenting symptoms, physical exam findings, and initial radiographic and laboratory data in the context of their comorbid conditions. Patient requires inpatient status due to high intensity of service, high risk for further  deterioration and high frequency of surveillance required.    Collier Bullock MD [facility]     06/05/2020, 10:56 AM

## 2020-06-05 NOTE — ED Notes (Signed)
Purewick placed on patient d/t hip fx.

## 2020-06-05 NOTE — Plan of Care (Signed)

## 2020-06-05 NOTE — ED Notes (Signed)
Placed back on monitor by RN.

## 2020-06-05 NOTE — ED Notes (Signed)
Visitor at bedside.

## 2020-06-06 LAB — CBC
HCT: 37.6 % (ref 36.0–46.0)
Hemoglobin: 12.7 g/dL (ref 12.0–15.0)
MCH: 30.8 pg (ref 26.0–34.0)
MCHC: 33.8 g/dL (ref 30.0–36.0)
MCV: 91.3 fL (ref 80.0–100.0)
Platelets: 185 10*3/uL (ref 150–400)
RBC: 4.12 MIL/uL (ref 3.87–5.11)
RDW: 13.6 % (ref 11.5–15.5)
WBC: 13.5 10*3/uL — ABNORMAL HIGH (ref 4.0–10.5)
nRBC: 0 % (ref 0.0–0.2)

## 2020-06-06 LAB — BASIC METABOLIC PANEL
Anion gap: 8 (ref 5–15)
BUN: 25 mg/dL — ABNORMAL HIGH (ref 8–23)
CO2: 26 mmol/L (ref 22–32)
Calcium: 8.3 mg/dL — ABNORMAL LOW (ref 8.9–10.3)
Chloride: 103 mmol/L (ref 98–111)
Creatinine, Ser: 0.67 mg/dL (ref 0.44–1.00)
GFR, Estimated: >60 mL/min (ref >60)
Glucose, Bld: 142 mg/dL — ABNORMAL HIGH (ref 70–99)
Potassium: 3.8 mmol/L (ref 3.5–5.1)
Sodium: 137 mmol/L (ref 135–145)

## 2020-06-06 LAB — TROPONIN I (HIGH SENSITIVITY)
Troponin I (High Sensitivity): 9 ng/L (ref <18)
Troponin I (High Sensitivity): 11 ng/L (ref <18)

## 2020-06-06 MED ORDER — CEFAZOLIN SODIUM-DEXTROSE 1-4 GM/50ML-% IV SOLN
1.0000 g | Freq: Three times a day (TID) | INTRAVENOUS | Status: AC
  Administered 2020-06-06: 1 g via INTRAVENOUS
  Filled 2020-06-06: qty 50

## 2020-06-06 MED ORDER — HYDROCODONE-ACETAMINOPHEN 5-325 MG PO TABS: 1.0000 | ORAL_TABLET | Freq: Four times a day (QID) | ORAL | 0 refills | Status: DC | PRN

## 2020-06-06 MED ORDER — DICLOFENAC SODIUM 1 % EX GEL
2.0000 g | Freq: Four times a day (QID) | CUTANEOUS | Status: DC
  Administered 2020-06-06 – 2020-06-11 (×21): 2 g via TOPICAL
  Filled 2020-06-06: qty 100

## 2020-06-06 MED ORDER — ENOXAPARIN SODIUM 40 MG/0.4ML ~~LOC~~ SOLN: 40.0000 mg | SUBCUTANEOUS | 0 refills | Status: DC

## 2020-06-06 NOTE — Progress Notes (Signed)
PT Cancellation Note  Patient Details Name: [name] MRN: [id number] DOB: [birth year]   Cancelled Treatment:    Reason Eval/Treat Not Completed: Medical issues which prohibited therapy (Upon attempt to see patient for afternoon session, patient is reporting chest pain and nursing held PT until this is further investigated. Will re-attempt at later time/date as able/medically appropriate.)   Everlean Alstrom. Graylon Good, PT, DPT 06/06/20, 2:15 PM

## 2020-06-06 NOTE — Progress Notes (Signed)
Met with the patient and her spouse in the room, she is agreeable to go to SNF for rehab, PASSR started and is pending, Bedsearch completed, FL2 completed, awaiting bed offers, she does not have DME at home, if disposition changes to home will need DME

## 2020-06-06 NOTE — Discharge Instructions (Signed)
INSTRUCTIONS AFTER Surgery  o Remove items at home which could result in a fall. This includes throw rugs or furniture in walking pathways o ICE to the affected joint every three hours while awake for 30 minutes at a time, for at least the first 3-5 days, and then as needed for pain and swelling.  Continue to use ice for pain and swelling. You may notice swelling that will progress down to the foot and ankle.  This is normal after surgery.  Elevate your leg when you are not up walking on it.   o Continue to use the breathing machine you got in the hospital (incentive spirometer) which will help keep your temperature down.  It is common for your temperature to cycle up and down following surgery, especially at night when you are not up moving around and exerting yourself.  The breathing machine keeps your lungs expanded and your temperature down.   DIET:  As you were doing prior to hospitalization, we recommend a well-balanced diet.  DRESSING / WOUND CARE / SHOWERING  Dressing change as needed.  No showering.  Follow-up at [facility] clinic orthopedics in 2 weeks for staple removal  ACTIVITY  o Increase activity slowly as tolerated, but follow the weight bearing instructions below.   o No driving for 6 weeks or until further direction given by your physician.  You cannot drive while taking narcotics.  o No lifting or carrying greater than 10 lbs. until further directed by your surgeon. o Avoid periods of inactivity such as sitting longer than an hour when not asleep. This helps prevent blood clots.  o You may return to work once you are authorized by your doctor.     WEIGHT BEARING  Weightbearing as tolerated   EXERCISES Gait training and ambulation training.  Activities of daily living with occupational therapy  CONSTIPATION  Constipation is defined medically as fewer than three stools per week and severe constipation as less than one stool per week.  Even if you have a regular bowel  pattern at home, your normal regimen is likely to be disrupted due to multiple reasons following surgery.  Combination of anesthesia, postoperative narcotics, change in appetite and fluid intake all can affect your bowels.   YOU MUST use at least one of the following options; they are listed in order of increasing strength to get the job done.  They are all available over the counter, and you may need to use some, POSSIBLY even all of these options:    Drink plenty of fluids (prune juice may be helpful) and high fiber foods Colace 100 mg by mouth twice a day  Senokot for constipation as directed and as needed Dulcolax (bisacodyl), take with full glass of water  Miralax (polyethylene glycol) once or twice a day as needed.  If you have tried all these things and are unable to have a bowel movement in the first 3-4 days after surgery call either your surgeon or your primary doctor.    If you experience loose stools or diarrhea, hold the medications until you stool forms back up.  If your symptoms do not get better within 1 week or if they get worse, check with your doctor.  If you experience "the worst abdominal pain ever" or develop nausea or vomiting, please contact the office immediately for further recommendations for treatment.   ITCHING:  If you experience itching with your medications, try taking only a single pain pill, or even half a pain pill at  a time.  You can also use Benadryl over the counter for itching or also to help with sleep.   TED HOSE STOCKINGS:  Use stockings on both legs until for at least 2 weeks or as directed by physician office. They may be removed at night for sleeping.  MEDICATIONS:  See your medication summary on the "After Visit Summary" that nursing will review with you.  You may have some home medications which will be placed on hold until you complete the course of blood thinner medication.  It is important for you to complete the blood thinner medication as  prescribed.  PRECAUTIONS:  If you experience chest pain or shortness of breath - call 911 immediately for transfer to the hospital emergency department.   If you develop a fever greater that 101 F, purulent drainage from wound, increased redness or drainage from wound, foul odor from the wound/dressing, or calf pain - CONTACT YOUR SURGEON.                                                   FOLLOW-UP APPOINTMENTS:  If you do not already have a post-op appointment, please call the office for an appointment to be seen by your surgeon.  Guidelines for how soon to be seen are listed in your "After Visit Summary", but are typically between 1-4 weeks after surgery.  OTHER INSTRUCTIONS:     MAKE SURE YOU:  . Understand these instructions.  . Get help right away if you are not doing well or get worse.    Thank you for letting us be a part of your medical care team.  It is a privilege we respect greatly.  We hope these instructions will help you stay on track for a fast and full recovery!

## 2020-06-06 NOTE — Progress Notes (Signed)
Physical Therapy Treatment Patient Details Name: [name] MRN: [id number] DOB: 1933-08-09 Today's Date: 06/06/2020    History of Present Illness [name] is a 87 y.o. female with medical history significant for hypertension, GERD, COPD, dyslipidemia, history of left breast cancer status post chemo and radiation therapy who presents to the emergency room 06/05/2020 via EMS for evaluation of pain in her left hip following a fall. Patient reports chronic right hip pain and has ambulates with a cane and intermittently with a walker for the last couple of months. Left hip x-ray shows acute left hip subcapital femoral neck fracture. Underwent cannulated hip pinning 06/05/2020.  Also complains of limiting L shoulder pain since fall (x-ray negative for fx)    PT Comments    Patient received sitting up in chair. Husband in room. Patient just completed assessment for chest pain and cleared by nursing to participate in PT after normal vitals and ekg came back normal. Patient reports she needs to have a BM. Patient required min-mod A for sit <> stand transfer with RW chair to [facility], BSC to bed, and bed to chair. Needed cuing for safe handling of RW and hand/body placement. Was limited more by L hip pain than at last session. Able to ambulate further this session without dizziness or feeling hot. Patient requested to return to chair at end of session. No chest pain reported thorughout session. HR WFL up to 104 bpm with activity and SpO2 dropped to 90% with 2L/min O2 during ambulation and returned to mid 90s with rest. Patient continues to require more physical assistance for safe mobility than can be provided at home and continues to need short term rehab. Patient would benefit from continued skilled physical therapy to address remaining impairments and functional limitations to work towards stated goals and return to PLOF or maximal functional independence.      Follow Up Recommendations  SNF     Equipment  Recommendations  Rolling walker with 5" wheels;3in1 (PT)    Recommendations for Other Services       Precautions / Restrictions Precautions Precautions: Fall Restrictions Weight Bearing Restrictions: Yes LLE Weight Bearing: Weight bearing as tolerated    Mobility  Bed Mobility                    Transfers Overall transfer level: Needs assistance Equipment used: Rolling walker (2 wheeled) Transfers: Sit to/from Stand Sit to Stand: Min assist;Mod assist         General transfer comment: Pateient requires min -mod A for sit <> stand transfers to/from chair/bed/BSC due to L hip and shoulder pain and overall weakness. Attempts not to use L UE to reach back or press up due to pain. Requires cuing for safe walker handling and hand placement, forward shift.  Ambulation/Gait Ambulation/Gait assistance: Min guard;+2 safety/equipment Gait Distance (Feet): 120 Feet Assistive device: Rolling walker (2 wheeled) Gait Pattern/deviations: Antalgic Gait velocity: slow   General Gait Details: Patient ambulated out of room with RW and CGA with chair follow for safety. Remains stooped and requires cuing for standing up straight and handling of RW with turns.   Stairs             Wheelchair Mobility    Modified Rankin (Stroke Patients Only)       Balance Overall balance assessment: Needs assistance Sitting-balance support: No upper extremity supported Sitting balance-Leahy Scale: Good Sitting balance - Comments: steady sitting on BSC, able to shift weight to wipe  Standing balance support: Single extremity supported Standing balance-Leahy Scale: Fair Standing balance comment: Patient able to let go of walker with both hands momentarily.                            Cognition Arousal/Alertness: Awake/alert Behavior During Therapy: WFL for tasks assessed/performed Overall Cognitive Status: Within Functional Limits for tasks assessed                                         Exercises Other Exercises Other Exercises: Pt practiced transfer to [facility], seated balance while having bowel movement and  standing balance while wiping and pulling up breif. Completed sit <> stand 3 times from various surfaces.    General Comments General comments (skin integrity, edema, etc.): SpO2 down to 90% with ambulation on 2L/min O2. HR up to 103 bpm. No chest pain during session.      Pertinent Vitals/Pain Pain Score: 10-Worst pain ever Pain Location: left shoulder with use and L hip with weight bearing. no pain at rest at start of session. Ended with her reporting L shoulder pain 5/10 and no hiph pain at rest. Informed nursing of L shoulder pain at rest. Pain Intervention(s): Limited activity within patient's tolerance;Monitored during session;Repositioned    Home Living                      Prior Function            PT Goals (current goals can now be found in the care plan section) Acute Rehab PT Goals Patient Stated Goal: get better, return home PT Goal Formulation: With patient/family Time For Goal Achievement: 06/20/20 Potential to Achieve Goals: Good Progress towards PT goals: Progressing toward goals    Frequency    BID      PT Plan Current plan remains appropriate    Co-evaluation              AM-PAC PT "6 Clicks" Mobility   Outcome Measure  Help needed turning from your back to your side while in a flat bed without using bedrails?: A Little Help needed moving from lying on your back to sitting on the side of a flat bed without using bedrails?: A Lot Help needed moving to and from a bed to a chair (including a wheelchair)?: A Lot Help needed standing up from a chair using your arms (e.g., wheelchair or bedside chair)?: A Lot Help needed to walk in hospital room?: A Little Help needed climbing 3-5 steps with a railing? : Total 6 Click Score: 13    End of Session Equipment Utilized During Treatment:  Gait belt;Oxygen (2L/min) Activity Tolerance: Patient tolerated treatment well;Patient limited by pain;Patient limited by fatigue Patient left: in chair;with call Olivia/phone within reach;with chair alarm set;with family/visitor present;with SCD's reapplied Nurse Communication: Mobility status PT Visit Diagnosis: Unsteadiness on feet (R26.81);History of falling (Z91.81);Muscle weakness (generalized) (M62.81);Difficulty in walking, not elsewhere classified (R26.2)     Time: 1610-9604 PT Time Calculation (min) (ACUTE ONLY): 38 min  Charges:  $Gait Training: 8-22 mins $Therapeutic Activity: 23-37 mins                     Everlean Alstrom. Graylon Good, PT, DPT 06/06/20, 3:34 PM

## 2020-06-06 NOTE — Evaluation (Signed)
Physical Therapy Evaluation Patient Details Name: [name] MRN: [id number] DOB: 1934-03-06 Today's Date: 06/06/2020   History of Present Illness  [name] is a 87 y.o. female with medical history significant for hypertension, GERD, COPD, dyslipidemia, history of left breast cancer status post chemo and radiation therapy who presents to the emergency room 06/05/2020 via EMS for evaluation of pain in her left hip following a fall. Patient reports chronic right hip pain and has ambulates with a cane and intermittently with a walker for the last couple of months. Left hip x-ray shows acute left hip subcapital femoral neck fracture. Underwent cannulated hip pinning 06/05/2020.  Also complains of limiting L shoulder pain since fall (x-ray negative for fx)    Clinical Impression  Patient received reclining in bed with 2L/min O2 delivered through nasal cannula and husband at bedside. Patient awake and alert and able to provide detailed history. Patient lives with her husband in a home where she can live on the main level with 4-5 steps to enter with B handrails that can be reached at the same time. She has a walk in shower that she puts a chair in. Prior to hospitalization, she had started sleeping in her recliner for the last 4 months due to increasing weakness that made it difficult to get in/out of bed. She was ambulating in the home with no AD and taking walks in the neighborhood with her walking stick. Has been using SPC in the home for the last 2 weeks due to continued decline in strength/mobility. Had started PT for her right hip pain. Reports 1 fall (that lead to this hospitalization) over the last 6 months. States her L shoulder is also really hurting and she has lost function in it since injuring it in the fall. Upon PT eval, patient required min A/modA for supine to sit and scooting to edge of bed and min A for bed to [facility] and BSC to chair transfers. She was incontinent of urine and required  additional help to change her socks, clean up, and don briefs for further mobility. Could tolerate very little pressure through L UE and was unable to lift her L arm more than a few degrees related to pain. Able to use L LE to advance walker once standing. Ambulated approx 20 feet to the room door and back. She reported starting to feel hot and nauseated as she got to the door. SpO2 dropped to 88% on 2L/min with ambulation and recovered to the 90s with seated rest. HR WFL. Patient appears to have experienced a significant decline in functional mobility/strength and is not strong enough to complete mobility safely at home at this time. She would benefit from short term rehab to get stronger before going home. Patient would benefit from skilled physical therapy to address impairments and functional limitations (see PT Problem List below) to work towards stated goals and return to PLOF or maximal functional independence. Patient left in chair with alarm set, needs within reach, heels elevated, husband in the room, 2L/min O2 donned.       Follow Up Recommendations SNF    Equipment Recommendations  Rolling walker with 5" wheels;3in1 (PT)    Recommendations for Other Services       Precautions / Restrictions Precautions Precautions: Fall Restrictions Weight Bearing Restrictions: Yes LLE Weight Bearing: Weight bearing as tolerated      Mobility  Bed Mobility Overal bed mobility: Needs Assistance Bed Mobility: Supine to Sit     Supine to  sit: Min assist;Mod assist     General bed mobility comments: required assistance to move L LE off bed and support at trunk to come to sit. Needed assistance at legs with scooting to edge of bed.    Transfers Overall transfer level: Needs assistance Equipment used: Rolling walker (2 wheeled) Transfers: Sit to/from Stand Sit to Stand: Min assist         General transfer comment: Patient with difficulty transferring due to minimal use of L UE second  to high level of shoulder pain with AROM or pushing. Able to move walker with L UE once up. Requires cuing for hand placement, forward shift, and hip/knee extension to stand. Requires physical assist at trunk.  Incontinent of urine and required breif to prevent soiling of self with further mobility attempts.  Ambulation/Gait Ambulation/Gait assistance: Min guard Gait Distance (Feet): 20 Feet Assistive device: Rolling walker (2 wheeled)   Gait velocity: very slow.   General Gait Details: Patient ambulates very slowly using RW with CGA with short steps and antalgic gait favoring L LE. Reported feeling hot and nausea when she reached the room door, so she returned to the chair, after which these symptoms resolved. SpO2 dropped to 88% on 2L/min O2 by end of session.  Stairs            Wheelchair Mobility    Modified Rankin (Stroke Patients Only)       Balance Overall balance assessment: Needs assistance Sitting-balance support: No upper extremity supported Sitting balance-Leahy Scale: Good Sitting balance - Comments: steady sitting on BSC   Standing balance support: Single extremity supported Standing balance-Leahy Scale: Fair Standing balance comment: Patient able to let go of walker with one hand in standing while PT applies breif.                             Pertinent Vitals/Pain Pain Assessment: Faces Faces Pain Scale: Hurts whole lot Pain Location: left shoulder with movement or pressure. Pain Intervention(s): Limited activity within patient's tolerance;Monitored during session;Repositioned    Home Living Family/patient expects to be discharged to:: Private residence Living Arrangements: Spouse/significant other Available Help at Discharge: Family;Available 24 hours/day Type of Home: House Home Access: Stairs to enter Entrance Stairs-Rails: Right;Left;Can reach both Entrance Stairs-Number of Steps: 4-5 Home Layout: Able to live on main level with  bedroom/bathroom Home Equipment: Grab bars - toilet;Cane - single point;Other (comment) (hiking stick)      Prior Function Level of Independence: Needs assistance   Gait / Transfers Assistance Needed: Pateint repors she has been sleeping in a recliner for the last 4 months due to difficulty getting in/out of bed. States prior to 2 weeks ago she ambulated in the home with no AD and went on walks in her neighborhood with hiking stick.  ADL's / Homemaking Assistance Needed: independent prior to hospitalization. Assised by husband as needed.        Hand Dominance        Extremity/Trunk Assessment   Upper Extremity Assessment Upper Extremity Assessment: Generalized weakness;LUE deficits/detail;Defer to OT evaluation LUE: Unable to fully assess due to pain    Lower Extremity Assessment Lower Extremity Assessment: LLE deficits/detail;Generalized weakness (able to resist knee extension 4/4 and good AROM in B ankles.) LLE Deficits / Details: limited hip flexion AROM due to pain/surgery.    Cervical / Trunk Assessment Cervical / Trunk Assessment: Normal  Communication   Communication: No difficulties  Cognition Arousal/Alertness: Awake/alert Behavior  During Therapy: WFL for tasks assessed/performed Overall Cognitive Status: Within Functional Limits for tasks assessed                                        General Comments      Exercises Other Exercises Other Exercises: Pt practiced transfer to [facility], seated balance while urinating/and having legs cleaned of urine after she was incontinent, and standing balance while donning breif. Educated patien/husband on role of PT in acute care setting.   Assessment/Plan    PT Assessment Patient needs continued PT services  PT Problem List Decreased strength;Decreased coordination;Pain;Cardiopulmonary status limiting activity;Decreased range of motion;Decreased activity tolerance;Decreased knowledge of use of DME;Decreased  balance;Decreased safety awareness;Decreased mobility;Decreased knowledge of precautions;Decreased skin integrity       PT Treatment Interventions DME instruction;Balance training;Gait training;Neuromuscular re-education;Stair training;Functional mobility training;Patient/family education;Therapeutic activities;Therapeutic exercise;Manual techniques    PT Goals (Current goals can be found in the Care Plan section)  Acute Rehab PT Goals Patient Stated Goal: get better, return home PT Goal Formulation: With patient/family Time For Goal Achievement: 06/20/20 Potential to Achieve Goals: Good    Frequency BID   Barriers to discharge   Patient's husband has also been having trouble with his hips and is not able to provide the level of physical assist she currently needs for safe mobiltiy.    Co-evaluation               AM-PAC PT "6 Clicks" Mobility  Outcome Measure Help needed turning from your back to your side while in a flat bed without using bedrails?: A Little Help needed moving from lying on your back to sitting on the side of a flat bed without using bedrails?: A Lot Help needed moving to and from a bed to a chair (including a wheelchair)?: A Lot Help needed standing up from a chair using your arms (e.g., wheelchair or bedside chair)?: A Lot Help needed to walk in hospital room?: A Lot Help needed climbing 3-5 steps with a railing? : Total 6 Click Score: 12    End of Session Equipment Utilized During Treatment: Gait belt;Oxygen (2L/min) Activity Tolerance: Patient tolerated treatment well;Patient limited by pain;Patient limited by fatigue Patient left: in chair;with call bell/phone within reach;with chair alarm set;with family/visitor present;with SCD's reapplied Nurse Communication: Mobility status PT Visit Diagnosis: Unsteadiness on feet (R26.81);History of falling (Z91.81);Muscle weakness (generalized) (M62.81);Difficulty in walking, not elsewhere classified (R26.2)     Time: 1696-7893 PT Time Calculation (min) (ACUTE ONLY): 60 min   Charges:   PT Evaluation $PT Eval Moderate Complexity: 1 Mod PT Treatments $Gait Training: 8-22 mins $Therapeutic Activity: 8-22 mins        Everlean Alstrom. Graylon Good, PT, DPT 06/06/20, 11:09 AM

## 2020-06-06 NOTE — NC FL2 (Signed)
[place] LEVEL OF CARE SCREENING TOOL     IDENTIFICATION  Patient Name: [name] Birthdate: [birth year] Sex: female Admission Date (Current Location): 06/05/2020  [place] and [place] Number:  Engineering geologist and Address:  [facility], [address] [place], [place] [address]      Provider Number: [id number]  Attending Physician Name and Address:  Val Riles, MD  Relative Name and Phone Number:  Mady Gemma [phone]    Current Level of Care: Hospital Recommended Level of Care: [place] Prior Approval Number:    Date Approved/Denied:   PASRR Number: Pending  Discharge Plan: SNF    Current Diagnoses: Patient Active Problem List   Diagnosis Date Noted  . Fracture of femoral neck, left, closed ([place]) 06/05/2020  . Atherosclerosis of aorta ([place]) 01/09/2020  . Stage 2 moderate COPD by GOLD classification ([place]) 01/09/2020  . HSV (herpes simplex virus) infection 01/09/2020  . BCC (basal cell carcinoma), face 10/12/2017  . Environmental and seasonal allergies 10/30/2014  . Psoriasis vulgaris 10/30/2014  . Essential (primary) hypertension 10/30/2014  . Gastro-esophageal reflux disease without esophagitis 10/30/2014  . Hot flash, menopausal 10/30/2014  . Hyperlipidemia, mixed 10/30/2014  . History of cancer of left breast 10/30/2014  . Bronchiectasis ([place]) 09/24/2013    Orientation RESPIRATION BLADDER Height & Weight     Self,Time,Situation,Place  Normal Continent Weight: 60 kg Height:  5\' 3"  (160 cm)  BEHAVIORAL SYMPTOMS/MOOD NEUROLOGICAL BOWEL NUTRITION STATUS      Continent Diet  AMBULATORY STATUS COMMUNICATION OF NEEDS Skin   Extensive Assist Verbally Surgical wounds                       Personal Care Assistance Level of Assistance  Bathing,Dressing Bathing Assistance: Limited assistance   Dressing Assistance: Limited assistance     Functional Limitations Info              SPECIAL CARE FACTORS FREQUENCY  PT (By licensed PT)     PT Frequency: 5 times a week              Contractures Contractures Info: Not present    Additional Factors Info  Code Status,Allergies Code Status Info: full Allergies Info: NKDA           Current Medications (06/06/2020):  This is the current hospital active medication list Current Facility-Administered Medications  Medication Dose Route Frequency Provider Last Rate Last Admin  . 0.9 %  sodium chloride infusion   Intravenous Continuous Hessie Knows, MD 75 mL/hr at 06/06/20 0753 New Bag at 06/06/20 0753  . acetaminophen (TYLENOL) tablet 650 mg  650 mg Oral Q6H PRN Hessie Knows, MD      . alum & mag hydroxide-simeth (MAALOX/MYLANTA) 200-200-20 MG/5ML suspension 30 mL  30 mL Oral Q4H PRN Hessie Knows, MD      . ascorbic acid (VITAMIN C) tablet 500 mg  500 mg Oral Daily Hessie Knows, MD   500 mg at 06/06/20 7035  . atorvastatin (LIPITOR) tablet 10 mg  10 mg Oral Daily Hessie Knows, MD   10 mg at 06/06/20 0820  . bisacodyl (DULCOLAX) suppository 10 mg  10 mg Rectal Daily PRN Hessie Knows, MD      . calcium-vitamin D (OSCAL WITH D) 500-200 MG-UNIT per tablet 1 tablet  1 tablet Oral Daily Hessie Knows, MD   1 tablet at 06/06/20 0820  . docusate sodium (COLACE) capsule 100 mg  100 mg Oral  BID Hessie Knows, MD   100 mg at 06/06/20 5573  . enoxaparin (LOVENOX) injection 40 mg  40 mg Subcutaneous Q24H Hessie Knows, MD   40 mg at 06/06/20 0818  . famotidine (PEPCID) tablet 20 mg  20 mg Oral BID Hessie Knows, MD   20 mg at 06/06/20 0819  . fluticasone (FLONASE) 50 MCG/ACT nasal spray 1 spray  1 spray Each Nare Daily Hessie Knows, MD   1 spray at 06/06/20 0825  . folic acid (FOLVITE) tablet 1 mg  1 mg Oral Daily PRN Hessie Knows, MD      . gabapentin (NEURONTIN) capsule 100 mg  100 mg Oral Daily Hessie Knows, MD   100 mg at 06/06/20 0820  . hydrochlorothiazide (MICROZIDE) capsule 12.5 mg  12.5 mg Oral Daily Hessie Knows, MD   12.5 mg at 06/06/20 0819  . HYDROcodone-acetaminophen (NORCO/VICODIN) 5-325 MG per tablet 1-2 tablet  1-2 tablet Oral Q6H PRN Hessie Knows, MD   1 tablet at 06/06/20 [phone]  . loratadine (CLARITIN) tablet 10 mg  10 mg Oral Daily Hessie Knows, MD   10 mg at 06/06/20 0824  . magnesium citrate solution 1 Bottle  1 Bottle Oral Once PRN Hessie Knows, MD      . magnesium hydroxide (MILK OF MAGNESIA) suspension 30 mL  30 mL Oral Daily PRN Hessie Knows, MD   30 mL at 06/06/20 0818  . menthol-cetylpyridinium (CEPACOL) lozenge 3 mg  1 lozenge Oral PRN Hessie Knows, MD       Or  . phenol (CHLORASEPTIC) mouth spray 1 spray  1 spray Mouth/Throat PRN Hessie Knows, MD      . methocarbamol (ROBAXIN) tablet 500 mg  500 mg Oral Q6H PRN Hessie Knows, MD   500 mg at 06/05/20 1222   Or  . methocarbamol (ROBAXIN) 500 mg in dextrose 5 % 50 mL IVPB  500 mg Intravenous Q6H PRN Hessie Knows, MD      . Derrill Memo ON 06/09/2020] methotrexate (RHEUMATREX) tablet 10 mg  10 mg Oral Weekly Hessie Knows, MD      . metoCLOPramide (REGLAN) tablet 5-10 mg  5-10 mg Oral Q8H PRN Hessie Knows, MD       Or  . metoCLOPramide (REGLAN) injection 5-10 mg  5-10 mg Intravenous Q8H PRN Hessie Knows, MD      . metoprolol tartrate (LOPRESSOR) tablet 12.5 mg  12.5 mg Oral Daily Hessie Knows, MD   12.5 mg at 06/06/20 0820  . morphine 2 MG/ML injection 0.5 mg  0.5 mg Intravenous Q2H PRN Hessie Knows, MD   0.5 mg at 06/05/20 1049  . multivitamin with minerals tablet 1 tablet  1 tablet Oral Daily Hessie Knows, MD   1 tablet at 06/06/20 [phone]  . omega-3 acid ethyl esters (LOVAZA) capsule 1 g  1 g Oral Daily Hessie Knows, MD   1 g at 06/06/20 0819  . ondansetron (ZOFRAN) injection 4 mg  4 mg Intravenous Q6H PRN Hessie Knows, MD   4 mg at 06/05/20 1221  . senna (SENOKOT) tablet 8.6 mg  1 tablet Oral BID Hessie Knows, MD   8.6 mg at 06/06/20 0819  . vitamin B-12 (CYANOCOBALAMIN) tablet 1,000 mcg  1,000 mcg Oral Daily Hessie Knows, MD   1,000 mcg at 06/06/20 0623     Discharge Medications: Please see discharge summary for a list of discharge medications.  Relevant Imaging Results:  Relevant Lab Results:   Additional Information SS# [id number]  Su Hilt, RN

## 2020-06-06 NOTE — Progress Notes (Signed)
  Subjective: 1 Day Post-Op Procedure(s) (LRB): CANNULATED HIP PINNING (Left) Patient reports pain as mild.   Patient is well, and has had no acute complaints or problems Plan is to go Rehab after hospital stay. Negative for chest pain and shortness of breath Fever: no Gastrointestinal: Negative for nausea and vomiting  Objective: Vital signs in last 24 hours: Temp:  [97.4 F (36.3 C)-98.7 F (37.1 C)] 97.9 F (36.6 C) (04/02 0435) Pulse Rate:  [63-118] 98 (04/02 0435) Resp:  [8-29] 17 (04/02 0435) BP: (127-188)/(60-86) 140/61 (04/02 0435) SpO2:  [82 %-100 %] 99 % (04/02 0435) Weight:  [60 kg] 60 kg (04/01 0843)  Intake/Output from previous day:  Intake/Output Summary (Last 24 hours) at 06/06/2020 0533 Last data filed at 06/06/2020 0403 Gross per 24 hour  Intake 1227.39 ml  Output 100 ml  Net 1127.39 ml    Intake/Output this shift: Total I/O In: 727.4 [I.V.:727.4] Out: -   Labs: Recent Labs    06/05/20 0954  HGB 14.0   Recent Labs    06/05/20 0954  WBC 21.1*  RBC 4.61  HCT 41.7  PLT 197   Recent Labs    06/05/20 0954  NA 140  K 4.0  CL 103  CO2 27  BUN 33*  CREATININE 0.70  GLUCOSE 124*  CALCIUM 9.4   No results for input(s): LABPT, INR in the last 72 hours.   EXAM General - Patient is Alert and Oriented Extremity - Neurovascular intact Sensation intact distally Dorsiflexion/Plantar flexion intact Compartment soft Dressing/Incision - clean, dry, no drainage Motor Function - intact, moving foot and toes well on exam.   Past Medical History:  Diagnosis Date  . Allergy   . Bronchiectasis ([place])   . Cancer [facility])    left breast cancer   . GERD (gastroesophageal reflux disease)   . Hyperlipemia   . Hypertension   . Intractable nausea and vomiting 06/20/2018    Assessment/Plan: 1 Day Post-Op Procedure(s) (LRB): CANNULATED HIP PINNING (Left) Principal Problem:   Fracture of femoral neck, left, closed (HCC) Active Problems:   Essential  (primary) hypertension   Gastro-esophageal reflux disease without esophagitis   Stage 2 moderate COPD by GOLD classification (HCC)  Estimated body mass index is 23.43 kg/m as calculated from the following:   Height as of this encounter: 5\' 3"  (1.6 m).   Weight as of this encounter: 60 kg. Advance diet Up with therapy D/C IV fluids   Discharge planning.  Follow-up at [facility] clinic orthopedics in 2 weeks for staple removal.  DVT Prophylaxis - Lovenox, Foot Pumps and TED hose Weight-Bearing as tolerated to left leg  Reche Dixon, PA-C Orthopaedic Surgery 06/06/2020, 5:33 AM

## 2020-06-06 NOTE — Evaluation (Signed)
Occupational Therapy Evaluation Patient Details Name: [name] MRN: [id number] DOB: May 05, 1933 Today's Date: 06/06/2020    History of Present Illness [name] is a 87 y.o. female with medical history significant for hypertension, GERD, COPD, dyslipidemia, history of left breast cancer status post chemo and radiation therapy who presents to the emergency room 06/05/2020 via EMS for evaluation of pain in her left hip following a fall. Patient reports chronic right hip pain and has ambulates with a cane and intermittently with a walker for the last couple of months. Left hip x-ray shows acute left hip subcapital femoral neck fracture. Underwent cannulated hip pinning 06/05/2020.  Also complains of limiting L shoulder pain since fall (x-ray negative for fx)   Clinical Impression   Pt seen for OT evaluation this date in setting of acute hospitalization d/t fall with hip fx, now s/p pinning on 4/1. Pt lives with spouse and reports being INDEP at baseline including playing golf. Pt presents this date with post-op pain and baseline limited ROM to R UE in addition to L shoulder pain s/p fall; all contributing to limited tolerance for performance of ADLs/ADL mobility. This date, pt able to perform seated UB ADLs with SETUP and requires MOD/MAX A for LB ADLs. Pt also currently requiring MIN/MOD A with RW for ADL transfers. Anticiapte that pt will require continued OT services while in acute setting and will require f/u STR in SNF setting upon d/c from hospital to safely restore strength and function.     Follow Up Recommendations  SNF    Equipment Recommendations  Other (comment) (defer)    Recommendations for Other Services       Precautions / Restrictions Precautions Precautions: Fall Restrictions Weight Bearing Restrictions: Yes LLE Weight Bearing: Weight bearing as tolerated      Mobility Bed Mobility               General bed mobility comments: up to chair pre/post OT     Transfers Overall transfer level: Needs assistance Equipment used: Rolling walker (2 wheeled) Transfers: Sit to/from Stand Sit to Stand: Min assist;Mod assist         General transfer comment: increased time and effortful 2/2 pain. Pt also limited as she has L shoulder pain and cannot reasonably push to stand from arm rests of chair with L side, only R.    Balance Overall balance assessment: Needs assistance Sitting-balance support: No upper extremity supported Sitting balance-Leahy Scale: Good Sitting balance - Comments: steady static   Standing balance support: Bilateral upper extremity supported Standing balance-Leahy Scale: Fair Standing balance comment: benefits from B UE support. or stability.                           ADL either performed or assessed with clinical judgement   ADL                                         General ADL Comments: SETUP for seated UB ADLs, MAX A for LB  ADLs d/t pain and limited ROM     Vision Patient Visual Report: No change from baseline       Perception     Praxis      Pertinent Vitals/Pain Pain Assessment: Faces Pain Score: 10-Worst pain ever Faces Pain Scale: Hurts whole lot Pain Location: left shoulder with use and  L hip with weight bearing. no pain at rest at start of session. Ended with her reporting L shoulder pain 5/10 and no hiph pain at rest. Informed nursing of L shoulder pain at rest. Pain Descriptors / Indicators: Tender;Sore Pain Intervention(s): Limited activity within patient's tolerance;Monitored during session;Repositioned     Hand Dominance     Extremity/Trunk Assessment Upper Extremity Assessment Upper Extremity Assessment: Generalized weakness;LUE deficits/detail LUE Deficits / Details: only ~1/3 shld ROM LUE: Unable to fully assess due to pain   Lower Extremity Assessment Lower Extremity Assessment: Defer to PT evaluation;RLE deficits/detail;LLE deficits/detail RLE  Deficits / Details: some baseline ROM limitations, states she was seeing HHPT LLE Deficits / Details: limited hip flexion AROM due to pain/surgery.   Cervical / Trunk Assessment Cervical / Trunk Assessment: Normal   Communication Communication Communication: No difficulties   Cognition Arousal/Alertness: Awake/alert Behavior During Therapy: WFL for tasks assessed/performed Overall Cognitive Status: Within Functional Limits for tasks assessed                                     General Comments  SpO2 down to 90% with ambulation on 2L/min O2. HR up to 103 bpm. No chest pain during session.    Exercises Other Exercises Other Exercises: OT facilitates ed re: role of OT, AE use for LB ADLs, importance of stretching within her tolerance to maintain ROM necearry for LB ADLs. Pt and spouse with good understanding, will require f/u for AE training and safety with RW for ADL transfers.   Shoulder Instructions      Home Living Family/patient expects to be discharged to:: Private residence Living Arrangements: Spouse/significant other Available Help at Discharge: Family;Available 24 hours/day Type of Home: House Home Access: Stairs to enter [employer] of Steps: 4-5 Entrance Stairs-Rails: Right;Left;Can reach both Home Layout: Able to live on main level with bedroom/bathroom     Bathroom Shower/Tub: Walk-in shower         Home Equipment: Grab bars - toilet;Cane - single point;Other (comment);Shower seat          Prior Functioning/Environment Level of Independence: Needs assistance  Gait / Transfers Assistance Needed: Pateint repors she has been sleeping in a recliner for the last 4 months due to difficulty getting in/out of bed. States prior to 2 weeks ago she ambulated in the home with no AD and went on walks in her neighborhood with hiking stick. ADL's / Homemaking Assistance Needed: independent prior to hospitalization. Assised by husband as needed.             OT Problem List: Decreased strength;Impaired balance (sitting and/or standing);Decreased range of motion;Decreased activity tolerance;Decreased knowledge of use of DME or AE;Pain;Impaired UE functional use      OT Treatment/Interventions: Self-care/ADL training;DME and/or AE instruction;Therapeutic activities;Balance training;Therapeutic exercise;Patient/family education    OT Goals(Current goals can be found in the care plan section) Acute Rehab OT Goals Patient Stated Goal: get better, return home OT Goal Formulation: With patient Time For Goal Achievement: 06/20/20 Potential to Achieve Goals: Good ADL Goals Pt Will Perform Lower Body Dressing: with min assist;with adaptive equipment;sit to/from stand (with LRAD) Pt Will Transfer to Toilet: with min assist;ambulating;bedside commode (BSC over standard commode in restroom to elevate with RW for fxl mobility to/from) Pt Will Perform Toileting - Clothing Manipulation and hygiene: with min assist;sit to/from stand  OT Frequency: Min 2X/week   Barriers to D/C:  Co-evaluation              AM-PAC OT "6 Clicks" Daily Activity     Outcome Measure Help from another person eating meals?: None Help from another person taking care of personal grooming?: A Little Help from another person toileting, which includes using toliet, bedpan, or urinal?: A Lot Help from another person bathing (including washing, rinsing, drying)?: A Lot Help from another person to put on and taking off regular upper body clothing?: None Help from another person to put on and taking off regular lower body clothing?: A Lot 6 Click Score: 17   End of Session Equipment Utilized During Treatment: Gait belt;Rolling walker Nurse Communication: Mobility status  Activity Tolerance: Patient tolerated treatment well Patient left: in chair;with call bell/phone within reach;with family/visitor present;with chair alarm set  OT Visit Diagnosis:  Unsteadiness on feet (R26.81);Muscle weakness (generalized) (M62.81)                Time: 1121-6244 OT Time Calculation (min): 33 min Charges:  OT General Charges $OT Visit: 1 Visit OT Evaluation $OT Eval Moderate Complexity: 1 Mod OT Treatments $Self Care/Home Management : 8-22 mins $Therapeutic Activity: 8-22 mins  Gerrianne Scale, MS, OTR/L ascom [phone] 06/06/20, 6:48 PM

## 2020-06-06 NOTE — Progress Notes (Signed)
Pt complains of chest pain. Dr. Dwyane Dee notified. Vitals signs taken-WNL. EKG and troponins ordered. Will monitor

## 2020-06-06 NOTE — Progress Notes (Signed)
[facility] Progress Note  Patient: [name]    RDE:[id number]  DOA: 06/05/2020     Date of Service: the patient was seen and examined on 06/06/2020  Chief Complaint  Patient presents with  . Fall   Brief hospital course: [name] is a 87 y.o. female with medical history significant for hypertension, GERD, COPD, dyslipidemia, history of left breast cancer status post chemo and radiation therapy who presents to the emergency room via EMS for evaluation of pain in her left hip following a fall. Patient reports chronic right hip pain and has ambulates with a cane and intermittently with a walker for the last couple of months.  Due to the pain in her right hip patient states that she lost her balance and fell landing on her left side.  She denies hitting her head or having any loss of consciousness.  She was able to get up with assistance from her husband but was unable to  bear weight on her left lower extremity due to pain.  EMS was called and patient was transported to the ER. She denied feeling dizzy or lightheaded, she denies having any chest pain, no shortness of breath, no nausea, no vomiting, no abdominal pain, no dysuria, no nocturia, no frequency, no palpitations, no diaphoresis, no constipation, no diarrhea, no headache or blurred vision. Labs show white count of 21,000, hemoglobin 14, hematocrit 41.7, MCV 90.5, RDW 13.7, platelet count 197,  Patient has pyuria Chest x-ray reviewed by me shows stable chronic chest findings.  Hyperinflated lung fields. Left hip x-ray shows acute left hip subcapital femoral neck fracture.. Left shoulder x-ray shows osteopenia and mild degenerative changes. No acute finding by plain radiography. CT scan of the head without contrast shows no evidence of acute intracranial abnormality. Mild cerebral atrophy and chronic small vessel ischemic disease. Prominent perivascular space versus chronic lacunar infarct within the inferior right basal  ganglia. Minimal bilateral ethmoid and left sphenoid sinus mucosal thickening. Twelve-lead EKG reviewed by me shows sinus tachycardia   ED Course: Patient is an 87 year old Caucasian female who was brought in by EMS for evaluation of left hip pain following a fall.  Patient has had chronic right hip pain for months which she said affects her gait.  Due to the pain she fell this morning landing on her left side and is noted to have an acute left hip subcapital femoral fracture Patient received fentanyl for pain control by EMS. Upon arrival to the ER she was found to have room air pulse oximetry of 84% and is currently on 2 L of oxygen with improvement in her pulse oximetry to 91%.  Patient appears to have a history of COPD but does not use home oxygen. Orthopedic surgery was consulted by the ER and patient will be admitted to the hospital for further evaluation.    Assessment and Plan: Principal Problem:   Fracture of femoral neck, left, closed ([place]) Active Problems:   Essential (primary) hypertension   Gastro-esophageal reflux disease without esophagitis   Stage 2 moderate COPD by GOLD classification (HCC)   Acute lower UTI    # Fracture of left femoral neck, Status post mechanical fall Immobilize left lower extremity Pain control Start patient on muscle relaxant Consult orthopedic surgery s/p left hip pinning done on 06/05/2020 Ortho recommended discharge planning, DVT prophylaxis for 2 weeks and follow-up as outpatient with orthopedics C/o Left shoulder pain after fall, apply topical Voltaren gel PT/OT eval done, recommend SNF placement   #  Marked leukocytosis most likely reactive Most likely stress related from recent fracture urine analysis not very impressive WBC trending down Monitor CBC   # Hypertension Continue hydrochlorothiazide and metoprolol    # Dyslipidemia Continue statins and  fish oil   Body mass index is 23.43 kg/m.  Nutrition Problem:  Increased nutrient needs Etiology: hip fracture Interventions: Interventions: Refer to RD note for recommendations      Diet: Regular diet DVT Prophylaxis: Subcutaneous Lovenox   Advance goals of care discussion: Full code  Family Communication: family was present at bedside, at the time of interview.  The pt provided permission to discuss medical plan with the family. Opportunity was given to ask question and all questions were answered satisfactorily.   Consults orthopedics Disposition:  Pt is from Home, admitted with fall and fracture of left hip, s/p hip pinning done by orthopedics.  Clinically stable and cleared by orthopedics to discharge.  Discharge to SNF, when bed available.  Subjective: No overnight issues, patient is feeling better now, left hip pain is under control, complaining of left shoulder pain, musculoskeletal due to fall. Patient denied any other active issues.  Physical Exam: General:  alert oriented to time, place, and person.  Appear in mild distress, affect appropriate Eyes: PERRLA ENT: Oral Mucosa Clear, moist  Neck: no JVD,  Cardiovascular: S1 and S2 Present, no Murmur,  Respiratory: good respiratory effort, Bilateral Air entry equal and Decreased, no Crackles, no wheezes Abdomen: Bowel Sound present, Soft and no tenderness,  Skin: no rashes Extremities: no Pedal edema, no calf tenderness Neurologic: without any new focal findings Gait not checked due to patient safety concerns  Vitals:   06/06/20 0435 06/06/20 0739 06/06/20 0820 06/06/20 1157  BP: 140/61 (!) 134/58 (!) 134/58 (!) 128/58  Pulse: 98 97 97 88  Resp: 17 16  16   Temp: 97.9 F (36.6 C) 98.2 F (36.8 C)  (!) 97.5 F (36.4 C)  TempSrc:      SpO2: 99% 96%  100%  Weight:      Height:        Intake/Output Summary (Last 24 hours) at 06/06/2020 1316 Last data filed at 06/06/2020 0945 Gross per 24 hour  Intake 1467.39 ml  Output 100 ml  Net 1367.39 ml   Filed Weights   06/05/20  0843  Weight: 60 kg    Data Reviewed: I have personally reviewed and interpreted daily labs, tele strips, imagings as discussed above. I reviewed all nursing notes, pharmacy notes, vitals, pertinent old records I have discussed plan of care as described above with RN and patient/family.  CBC: Recent Labs  Lab 06/05/20 0954 06/06/20 0458  WBC 21.1* 13.5*  HGB 14.0 12.7  HCT 41.7 37.6  MCV 90.5 91.3  PLT 197 662   Basic Metabolic Panel: Recent Labs  Lab 06/05/20 0954 06/06/20 0458  NA 140 137  K 4.0 3.8  CL 103 103  CO2 27 26  GLUCOSE 124* 142*  BUN 33* 25*  CREATININE 0.70 0.67  CALCIUM 9.4 8.3*    Studies: DG HIP OPERATIVE UNILAT W OR W/O PELVIS LEFT  Result Date: 06/05/2020 CLINICAL DATA:  Left hip pinning. EXAM: OPERATIVE LEFT HIP (WITH PELVIS IF PERFORMED) 2 VIEWS TECHNIQUE: Fluoroscopic spot image(s) were submitted for interpretation post-operatively. Fluoroscopy time, 1 minute COMPARISON:  06/05/2020 FINDINGS: Two fluoroscopic images demonstrate placement of 3 surgical screws in the left femoral head and neck. IMPRESSION: Surgical fixation of the left hip fracture. Electronically Signed   By: Quita Skye  Anselm Pancoast M.D.   On: 06/05/2020 16:18    Scheduled Meds: . ascorbic acid  500 mg Oral Daily  . atorvastatin  10 mg Oral Daily  . calcium-vitamin D  1 tablet Oral Daily  . diclofenac Sodium  2 g Topical QID  . docusate sodium  100 mg Oral BID  . enoxaparin (LOVENOX) injection  40 mg Subcutaneous Q24H  . famotidine  20 mg Oral BID  . fluticasone  1 spray Each Nare Daily  . gabapentin  100 mg Oral Daily  . hydrochlorothiazide  12.5 mg Oral Daily  . loratadine  10 mg Oral Daily  . [START ON 06/09/2020] methotrexate  10 mg Oral Weekly  . metoprolol tartrate  12.5 mg Oral Daily  . multivitamin with minerals  1 tablet Oral Daily  . omega-3 acid ethyl esters  1 g Oral Daily  . senna  1 tablet Oral BID  . vitamin B-12  1,000 mcg Oral Daily   Continuous Infusions: . sodium  chloride 75 mL/hr at 06/06/20 0753  . methocarbamol (ROBAXIN) IV     PRN Meds: acetaminophen, alum & mag hydroxide-simeth, bisacodyl, folic acid, HYDROcodone-acetaminophen, magnesium citrate, magnesium hydroxide, menthol-cetylpyridinium **OR** phenol, methocarbamol **OR** methocarbamol (ROBAXIN) IV, metoCLOPramide **OR** metoCLOPramide (REGLAN) injection, morphine injection, ondansetron (ZOFRAN) IV  Time spent: 35 minutes  Author: Val Riles. MD [facility] 06/06/2020 1:16 PM  To reach On-call, see care teams to locate the attending and reach out to them via [email or web address]. If 7PM-7AM, please contact night-coverage If you still have difficulty reaching the attending provider, please page the [facility] (Director on Call) for [facility] on amion for assistance.

## 2020-06-07 ENCOUNTER — Inpatient Hospital Stay: Payer: Medicare PPO

## 2020-06-07 LAB — BLOOD GAS, ARTERIAL
Acid-Base Excess: 9.1 mmol/L — ABNORMAL HIGH (ref 0.0–2.0)
Bicarbonate: 33.5 mmol/L — ABNORMAL HIGH (ref 20.0–28.0)
FIO2: 0.28
O2 Saturation: 94.1 %
Patient temperature: 37
pCO2 arterial: 44 mmHg (ref 32.0–48.0)
pH, Arterial: 7.49 — ABNORMAL HIGH (ref 7.350–7.450)
pO2, Arterial: 65 mmHg — ABNORMAL LOW (ref 83.0–108.0)

## 2020-06-07 LAB — BASIC METABOLIC PANEL
Anion gap: 8 (ref 5–15)
BUN: 23 mg/dL (ref 8–23)
CO2: 27 mmol/L (ref 22–32)
Calcium: 8.3 mg/dL — ABNORMAL LOW (ref 8.9–10.3)
Chloride: 105 mmol/L (ref 98–111)
Creatinine, Ser: 0.59 mg/dL (ref 0.44–1.00)
GFR, Estimated: >60 mL/min (ref >60)
Glucose, Bld: 117 mg/dL — ABNORMAL HIGH (ref 70–99)
Potassium: 3.6 mmol/L (ref 3.5–5.1)
Sodium: 140 mmol/L (ref 135–145)

## 2020-06-07 LAB — CBC
HCT: 35.1 % — ABNORMAL LOW (ref 36.0–46.0)
Hemoglobin: 12 g/dL (ref 12.0–15.0)
MCH: 31.4 pg (ref 26.0–34.0)
MCHC: 34.2 g/dL (ref 30.0–36.0)
MCV: 91.9 fL (ref 80.0–100.0)
Platelets: 159 10*3/uL (ref 150–400)
RBC: 3.82 MIL/uL — ABNORMAL LOW (ref 3.87–5.11)
RDW: 13.9 % (ref 11.5–15.5)
WBC: 16.4 10*3/uL — ABNORMAL HIGH (ref 4.0–10.5)
nRBC: 0 % (ref 0.0–0.2)

## 2020-06-07 LAB — PHOSPHORUS: Phosphorus: 1.5 mg/dL — ABNORMAL LOW (ref 2.5–4.6)

## 2020-06-07 LAB — MAGNESIUM: Magnesium: 2.2 mg/dL (ref 1.7–2.4)

## 2020-06-07 MED ORDER — TAMSULOSIN HCL 0.4 MG PO CAPS
0.4000 mg | ORAL_CAPSULE | Freq: Every day | ORAL | Status: DC
  Administered 2020-06-07 – 2020-06-11 (×5): 0.4 mg via ORAL
  Filled 2020-06-07 (×5): qty 1

## 2020-06-07 MED ORDER — CHLORHEXIDINE GLUCONATE CLOTH 2 % EX PADS
6.0000 | MEDICATED_PAD | Freq: Every day | CUTANEOUS | Status: DC
  Administered 2020-06-08 – 2020-06-11 (×5): 6 via TOPICAL

## 2020-06-07 MED ORDER — POTASSIUM PHOSPHATES 15 MMOLE/5ML IV SOLN
30.0000 mmol | Freq: Once | INTRAVENOUS | Status: AC
  Administered 2020-06-07: 30 mmol via INTRAVENOUS
  Filled 2020-06-07: qty 10

## 2020-06-07 NOTE — Progress Notes (Signed)
[facility] Progress Note  Patient: [name]    KAJ:[id number]  DOA: 06/05/2020     Date of Service: the patient was seen and examined on 06/07/2020  Chief Complaint  Patient presents with  . Fall   Brief hospital course: [name] is a 87 y.o. female with medical history significant for hypertension, GERD, COPD, dyslipidemia, history of left breast cancer status post chemo and radiation therapy who presents to the emergency room via EMS for evaluation of pain in her left hip following a fall. Patient reports chronic right hip pain and has ambulates with a cane and intermittently with a walker for the last couple of months.  Due to the pain in her right hip patient states that she lost her balance and fell landing on her left side.  She denies hitting her head or having any loss of consciousness.  She was able to get up with assistance from her husband but was unable to  bear weight on her left lower extremity due to pain.  EMS was called and patient was transported to the ER. She denied feeling dizzy or lightheaded, she denies having any chest pain, no shortness of breath, no nausea, no vomiting, no abdominal pain, no dysuria, no nocturia, no frequency, no palpitations, no diaphoresis, no constipation, no diarrhea, no headache or blurred vision. Labs show white count of 21,000, hemoglobin 14, hematocrit 41.7, MCV 90.5, RDW 13.7, platelet count 197,  Patient has pyuria Chest x-ray reviewed by me shows stable chronic chest findings.  Hyperinflated lung fields. Left hip x-ray shows acute left hip subcapital femoral neck fracture.. Left shoulder x-ray shows osteopenia and mild degenerative changes. No acute finding by plain radiography. CT scan of the head without contrast shows no evidence of acute intracranial abnormality. Mild cerebral atrophy and chronic small vessel ischemic disease. Prominent perivascular space versus chronic lacunar infarct within the inferior right basal  ganglia. Minimal bilateral ethmoid and left sphenoid sinus mucosal thickening. Twelve-lead EKG reviewed by me shows sinus tachycardia   ED Course: Patient is an 87 year old Caucasian female who was brought in by EMS for evaluation of left hip pain following a fall.  Patient has had chronic right hip pain for months which she said affects her gait.  Due to the pain she fell this morning landing on her left side and is noted to have an acute left hip subcapital femoral fracture Patient received fentanyl for pain control by EMS. Upon arrival to the ER she was found to have room air pulse oximetry of 84% and is currently on 2 L of oxygen with improvement in her pulse oximetry to 91%.  Patient appears to have a history of COPD but does not use home oxygen. Orthopedic surgery was consulted by the ER and patient will be admitted to the hospital for further evaluation.    Assessment and Plan: Principal Problem:   Fracture of femoral neck, left, closed ([place]) Active Problems:   Essential (primary) hypertension   Gastro-esophageal reflux disease without esophagitis   Stage 2 moderate COPD by GOLD classification (HCC)   Acute lower UTI    # Fracture of left femoral neck, Status post mechanical fall Immobilize left lower extremity Pain control Start patient on muscle relaxant Consult orthopedic surgery s/p left hip pinning done on 06/05/2020 Ortho recommended discharge planning, DVT prophylaxis for 2 weeks and follow-up as outpatient with orthopedics C/o Left shoulder pain after fall, apply topical Voltaren gel PT/OT eval done, recommend SNF placement   #  Marked leukocytosis most likely reactive Most likely stress related from recent fracture urine analysis not very impressive WBC trending down Monitor CBC   # Hypertension Continue hydrochlorothiazide and metoprolol    # Dyslipidemia Continue statins and  fish oil  # Hypophosphatemia, phosphorus repleted.    Body mass  index is 23.43 kg/m.  Nutrition Problem: Increased nutrient needs Etiology: hip fracture Interventions: Interventions: Refer to RD note for recommendations      Diet: Regular diet DVT Prophylaxis: Subcutaneous Lovenox   Advance goals of care discussion: Full code  Family Communication: family was present at bedside, at the time of interview.  The pt provided permission to discuss medical plan with the family. Opportunity was given to ask question and all questions were answered satisfactorily.   Consults orthopedics Disposition:  Pt is from Home, admitted with fall and fracture of left hip, s/p hip pinning done by orthopedics.  Clinically stable and cleared by orthopedics to discharge.  Discharge to SNF, when bed available.  Subjective: No overnight issues, today patient feels generalized tiredness and mild shortness of breath.  Pain mostly in the left shoulder which is more worse on movement but well controlled at rest.   Physical Exam: General:  alert oriented to time, place, and person.  Appear in mild distress, affect appropriate Eyes: PERRLA ENT: Oral Mucosa Clear, moist  Neck: no JVD,  Cardiovascular: S1 and S2 Present, no Murmur,  Respiratory: good respiratory effort, Bilateral Air entry equal and Decreased, no Crackles, no wheezes Abdomen: Bowel Sound present, Soft and no tenderness,  Skin: no rashes Extremities: no Pedal edema, no calf tenderness Neurologic: without any new focal findings Gait not checked due to patient safety concerns  Vitals:   06/06/20 2020 06/07/20 0417 06/07/20 0754 06/07/20 1224  BP: (!) 160/68 (!) 145/58 (!) 155/74 140/65  Pulse: 98 99 92 98  Resp: 17 16 18 16   Temp: 98.8 F (37.1 C) 98.4 F (36.9 C) 98 F (36.7 C) 98.1 F (36.7 C)  TempSrc: Oral  Oral Oral  SpO2: 99% 100% 98% 94%  Weight:      Height:        Intake/Output Summary (Last 24 hours) at 06/07/2020 1417 Last data filed at 06/07/2020 1352 Gross per 24 hour  Intake  1417.4 ml  Output 300 ml  Net 1117.4 ml   Filed Weights   06/05/20 0843  Weight: 60 kg    Data Reviewed: I have personally reviewed and interpreted daily labs, tele strips, imagings as discussed above. I reviewed all nursing notes, pharmacy notes, vitals, pertinent old records I have discussed plan of care as described above with RN and patient/family.  CBC: Recent Labs  Lab 06/05/20 0954 06/06/20 0458 06/07/20 0700  WBC 21.1* 13.5* 16.4*  HGB 14.0 12.7 12.0  HCT 41.7 37.6 35.1*  MCV 90.5 91.3 91.9  PLT 197 185 196   Basic Metabolic Panel: Recent Labs  Lab 06/05/20 0954 06/06/20 0458 06/07/20 0700  NA 140 137 140  K 4.0 3.8 3.6  CL 103 103 105  CO2 27 26 27   GLUCOSE 124* 142* 117*  BUN 33* 25* 23  CREATININE 0.70 0.67 0.59  CALCIUM 9.4 8.3* 8.3*  MG  --   --  2.2  PHOS  --   --  1.5*    Studies: No results found.  Scheduled Meds: . ascorbic acid  500 mg Oral Daily  . atorvastatin  10 mg Oral Daily  . calcium-vitamin D  1 tablet Oral Daily  .  diclofenac Sodium  2 g Topical QID  . docusate sodium  100 mg Oral BID  . enoxaparin (LOVENOX) injection  40 mg Subcutaneous Q24H  . famotidine  20 mg Oral BID  . fluticasone  1 spray Each Nare Daily  . gabapentin  100 mg Oral Daily  . hydrochlorothiazide  12.5 mg Oral Daily  . loratadine  10 mg Oral Daily  . [START ON 06/09/2020] methotrexate  10 mg Oral Weekly  . metoprolol tartrate  12.5 mg Oral Daily  . multivitamin with minerals  1 tablet Oral Daily  . omega-3 acid ethyl esters  1 g Oral Daily  . senna  1 tablet Oral BID  . vitamin B-12  1,000 mcg Oral Daily   Continuous Infusions: . sodium chloride 75 mL/hr at 06/06/20 0753  . methocarbamol (ROBAXIN) IV    . potassium PHOSPHATE IVPB (in mmol) 30 mmol (06/07/20 1136)   PRN Meds: acetaminophen, alum & mag hydroxide-simeth, bisacodyl, folic acid, HYDROcodone-acetaminophen, magnesium citrate, magnesium hydroxide, menthol-cetylpyridinium **OR** phenol,  methocarbamol **OR** methocarbamol (ROBAXIN) IV, metoCLOPramide **OR** metoCLOPramide (REGLAN) injection, morphine injection, ondansetron (ZOFRAN) IV  Time spent: 35 minutes  Author: Val Riles. MD [facility] 06/07/2020 2:17 PM  To reach On-call, see care teams to locate the attending and reach out to them via [email or web address]. If 7PM-7AM, please contact night-coverage If you still have difficulty reaching the attending provider, please page the [facility] (Director on Call) for [facility] on amion for assistance.

## 2020-06-07 NOTE — Progress Notes (Signed)
Physical Therapy Treatment Patient Details Name: [name] MRN: [id number] DOB: 1933/09/06 Today's Date: 06/07/2020    History of Present Illness [name] is a 87 y.o. female with medical history significant for hypertension, GERD, COPD, dyslipidemia, history of left breast cancer status post chemo and radiation therapy who presents to the emergency room 06/05/2020 via EMS for evaluation of pain in her left hip following a fall. Patient reports chronic right hip pain and has ambulates with a cane and intermittently with a walker for the last couple of months. Left hip x-ray shows acute left hip subcapital femoral neck fracture. Underwent cannulated hip pinning 06/05/2020.  Also complains of limiting L shoulder pain since fall (x-ray negative for fx)    PT Comments    Participated in exercises as described below.  Pt on 2 lpm O2 (sats steadily decrease on attempts at room air during ex and returned to 2 lpm).  RN aware.  To EOB with mod a x 1.  She is able to stand with mod a x 1 and upon standing inc urine and commode is brought.  Transfers very slowly to commode hesitant to move feet.  Voided and BM.  RN in to assist as she has increased difficulty standing from commode due to height and inability to push LUE to assist.  She does transfer to recliner at bedside and remains standing for LLE ex.    General increased difficulty with mobility today.  Does report some dizziness throughout which is relieved somewhat with time 3/10.  Struggles with transfer and gait today.  Remains in recliner and encouraged PO intake as breakfast was limited.  Assisted with using incentive spirometer and stressed importance of it hourly to pt and husband.   Follow Up Recommendations  SNF     Equipment Recommendations  Rolling walker with 5" wheels;3in1 (PT)    Recommendations for Other Services       Precautions / Restrictions Precautions Precautions: Fall Restrictions Weight Bearing Restrictions: No LLE  Weight Bearing: Weight bearing as tolerated Other Position/Activity Restrictions: L shoulder pain    Mobility  Bed Mobility Overal bed mobility: Needs Assistance Bed Mobility: Supine to Sit     Supine to sit: Mod assist          Transfers Overall transfer level: Needs assistance Equipment used: Rolling walker (2 wheeled) Transfers: Sit to/from Stand Sit to Stand: Mod assist         General transfer comment: heavy assist from commode, limtied by ability to push L UE  Ambulation/Gait Ambulation/Gait assistance: Min assist Gait Distance (Feet): 5 Feet Assistive device: Rolling walker (2 wheeled) Gait Pattern/deviations: Antalgic Gait velocity: slow   General Gait Details: hisitant to advance R LE due to pain LLE/UE   Stairs             Wheelchair Mobility    Modified Rankin (Stroke Patients Only)       Balance Overall balance assessment: Needs assistance Sitting-balance support: No upper extremity supported Sitting balance-Leahy Scale: Good Sitting balance - Comments: steady static   Standing balance support: Bilateral upper extremity supported Standing balance-Leahy Scale: Poor Standing balance comment: benefits from B UE support. or stability. hand on assist at all times,                            Cognition Arousal/Alertness: Awake/alert Behavior During Therapy: WFL for tasks assessed/performed Overall Cognitive Status: Within Functional Limits for tasks assessed  Exercises Other Exercises Other Exercises: BLE AAROM x 10 supine , L in standing Other Exercises: commode to void and BM    General Comments        Pertinent Vitals/Pain Pain Assessment: Faces Faces Pain Scale: Hurts even more Pain Location: L shoulder > L hip Pain Descriptors / Indicators: Tender;Sore Pain Intervention(s): Limited activity within patient's tolerance;Monitored during session;Repositioned     Home Living                      Prior Function            PT Goals (current goals can now be found in the care plan section) Progress towards PT goals: Progressing toward goals    Frequency    BID      PT Plan Current plan remains appropriate    Co-evaluation              AM-PAC PT "6 Clicks" Mobility   Outcome Measure  Help needed turning from your back to your side while in a flat bed without using bedrails?: A Little Help needed moving from lying on your back to sitting on the side of a flat bed without using bedrails?: A Lot Help needed moving to and from a bed to a chair (including a wheelchair)?: A Lot Help needed standing up from a chair using your arms (e.g., wheelchair or bedside chair)?: A Lot Help needed to walk in hospital room?: A Lot Help needed climbing 3-5 steps with a railing? : Total 6 Click Score: 12    End of Session Equipment Utilized During Treatment: Gait belt;Oxygen Activity Tolerance: Patient tolerated treatment well;Patient limited by pain;Patient limited by fatigue Patient left: in chair;with call bell/phone within reach;with chair alarm set;with family/visitor present Nurse Communication: Mobility status PT Visit Diagnosis: Unsteadiness on feet (R26.81);History of falling (Z91.81);Muscle weakness (generalized) (M62.81);Difficulty in walking, not elsewhere classified (R26.2)     Time: 8676-7209 PT Time Calculation (min) (ACUTE ONLY): 53 min  Charges:  $Gait Training: 8-22 mins $Therapeutic Exercise: 8-22 mins $Therapeutic Activity: 23-37 mins                    Chesley Noon, PTA 06/07/20, 11:50 AM

## 2020-06-07 NOTE — Progress Notes (Signed)
  Subjective: 2 Days Post-Op Procedure(s) (LRB): CANNULATED HIP PINNING (Left) Patient reports pain as mild.  Complaining of her left shoulder as well after the fall. Patient is well, and has had no acute complaints or problems Plan is to go Rehab after hospital stay. Negative for chest pain and shortness of breath Fever: no Gastrointestinal: Negative for nausea and vomiting  Objective: Vital signs in last 24 hours: Temp:  [97.5 F (36.4 C)-98.8 F (37.1 C)] 98.4 F (36.9 C) (04/03 0417) Pulse Rate:  [88-99] 99 (04/03 0417) Resp:  [16-17] 16 (04/03 0417) BP: (128-160)/(57-68) 145/58 (04/03 0417) SpO2:  [94 %-100 %] 100 % (04/03 0417)  Intake/Output from previous day:  Intake/Output Summary (Last 24 hours) at 06/07/2020 0658 Last data filed at 06/07/2020 0556 Gross per 24 hour  Intake 1057.4 ml  Output 300 ml  Net 757.4 ml    Intake/Output this shift: Total I/O In: -  Out: 250 [Urine:250]  Labs: Recent Labs    06/05/20 0954 06/06/20 0458  HGB 14.0 12.7   Recent Labs    06/05/20 0954 06/06/20 0458  WBC 21.1* 13.5*  RBC 4.61 4.12  HCT 41.7 37.6  PLT 197 185   Recent Labs    06/05/20 0954 06/06/20 0458  NA 140 137  K 4.0 3.8  CL 103 103  CO2 27 26  BUN 33* 25*  CREATININE 0.70 0.67  GLUCOSE 124* 142*  CALCIUM 9.4 8.3*   No results for input(s): LABPT, INR in the last 72 hours.   EXAM General - Patient is Alert and Oriented Extremity - Neurovascular intact Sensation intact distally Dorsiflexion/Plantar flexion intact Compartment soft Dressing/Incision - clean, dry, no drainage Motor Function - intact, moving foot and toes well on exam.  Ambulated 120 feet with physical therapy.  Still having some difficulty with raising her left shoulder secondary to bruising and pain.  Past Medical History:  Diagnosis Date  . Allergy   . Bronchiectasis ([place])   . Cancer [facility])    left breast cancer   . GERD (gastroesophageal reflux disease)   . Hyperlipemia    . Hypertension   . Intractable nausea and vomiting 06/20/2018    Assessment/Plan: 2 Days Post-Op Procedure(s) (LRB): CANNULATED HIP PINNING (Left) Principal Problem:   Fracture of femoral neck, left, closed (HCC) Active Problems:   Essential (primary) hypertension   Gastro-esophageal reflux disease without esophagitis   Stage 2 moderate COPD by GOLD classification (HCC)  Estimated body mass index is 23.43 kg/m as calculated from the following:   Height as of this encounter: 5\' 3"  (1.6 m).   Weight as of this encounter: 60 kg. Advance diet Up with therapy D/C IV fluids   Discharge planning.  Follow-up at [facility] clinic orthopedics in 2 weeks for staple removal.  Evaluate left shoulder for possible rotator cuff injury at orthopedics.  DVT Prophylaxis - Lovenox, Foot Pumps and TED hose Weight-Bearing as tolerated to left leg  Reche Dixon, PA-C Orthopaedic Surgery 06/07/2020, 6:58 AM

## 2020-06-08 ENCOUNTER — Inpatient Hospital Stay: Payer: Medicare PPO

## 2020-06-08 ENCOUNTER — Encounter: Payer: Self-pay | Admitting: Orthopedic Surgery

## 2020-06-08 LAB — BASIC METABOLIC PANEL
Anion gap: 8 (ref 5–15)
BUN: 15 mg/dL (ref 8–23)
CO2: 28 mmol/L (ref 22–32)
Calcium: 8.3 mg/dL — ABNORMAL LOW (ref 8.9–10.3)
Chloride: 100 mmol/L (ref 98–111)
Creatinine, Ser: 0.57 mg/dL (ref 0.44–1.00)
GFR, Estimated: >60 mL/min (ref >60)
Glucose, Bld: 115 mg/dL — ABNORMAL HIGH (ref 70–99)
Potassium: 3.5 mmol/L (ref 3.5–5.1)
Sodium: 136 mmol/L (ref 135–145)

## 2020-06-08 LAB — URINALYSIS, COMPLETE (UACMP) WITH MICROSCOPIC
Bacteria, UA: NONE SEEN
Bilirubin Urine: NEGATIVE
Glucose, UA: NEGATIVE mg/dL
Ketones, ur: 5 mg/dL — AB
Leukocytes,Ua: NEGATIVE
Nitrite: NEGATIVE
Protein, ur: NEGATIVE mg/dL
Specific Gravity, Urine: 1.012 (ref 1.005–1.030)
Squamous Epithelial / HPF: NONE SEEN (ref 0–5)
pH: 6 (ref 5.0–8.0)

## 2020-06-08 LAB — D-DIMER, QUANTITATIVE: D-Dimer, Quant: 1.71 ug/mL-FEU — ABNORMAL HIGH (ref 0.00–0.50)

## 2020-06-08 LAB — CBC
HCT: 34 % — ABNORMAL LOW (ref 36.0–46.0)
Hemoglobin: 11.4 g/dL — ABNORMAL LOW (ref 12.0–15.0)
MCH: 30.3 pg (ref 26.0–34.0)
MCHC: 33.5 g/dL (ref 30.0–36.0)
MCV: 90.4 fL (ref 80.0–100.0)
Platelets: 152 10*3/uL (ref 150–400)
RBC: 3.76 MIL/uL — ABNORMAL LOW (ref 3.87–5.11)
RDW: 13.5 % (ref 11.5–15.5)
WBC: 14.4 10*3/uL — ABNORMAL HIGH (ref 4.0–10.5)
nRBC: 0 % (ref 0.0–0.2)

## 2020-06-08 LAB — MAGNESIUM: Magnesium: 2.2 mg/dL (ref 1.7–2.4)

## 2020-06-08 LAB — PHOSPHORUS: Phosphorus: 1.8 mg/dL — ABNORMAL LOW (ref 2.5–4.6)

## 2020-06-08 MED ORDER — SENNA 8.6 MG PO TABS
1.0000 | ORAL_TABLET | Freq: Every day | ORAL | Status: DC
  Administered 2020-06-09: 8.6 mg via ORAL
  Filled 2020-06-08: qty 1

## 2020-06-08 MED ORDER — POLYETHYLENE GLYCOL 3350 17 G PO PACK
17.0000 g | PACK | Freq: Every day | ORAL | Status: DC
  Administered 2020-06-11: 17 g via ORAL
  Filled 2020-06-08 (×3): qty 1

## 2020-06-08 MED ORDER — IOHEXOL 350 MG/ML SOLN
75.0000 mL | Freq: Once | INTRAVENOUS | Status: AC | PRN
  Administered 2020-06-08: 75 mL via INTRAVENOUS

## 2020-06-08 MED ORDER — ENSURE ENLIVE PO LIQD
237.0000 mL | Freq: Two times a day (BID) | ORAL | Status: DC
  Administered 2020-06-08 – 2020-06-11 (×7): 237 mL via ORAL

## 2020-06-08 MED ORDER — POTASSIUM & SODIUM PHOSPHATES 280-160-250 MG PO PACK
1.0000 | PACK | Freq: Three times a day (TID) | ORAL | Status: DC
  Administered 2020-06-08 – 2020-06-09 (×3): 1 via ORAL
  Filled 2020-06-08 (×7): qty 1

## 2020-06-08 NOTE — Plan of Care (Signed)
Pt more awake today. Still didn't make it back out in the hall for a walk. Will continue to monitor

## 2020-06-08 NOTE — Progress Notes (Signed)
Physical Therapy Treatment Patient Details Name: [name] MRN: [id number] DOB: 1933-07-15 Today's Date: 06/08/2020    History of Present Illness [name] is a 87 y.o. female with medical history significant for hypertension, GERD, COPD, dyslipidemia, history of left breast cancer status post chemo and radiation therapy who presents to the emergency room 06/05/2020 via EMS for evaluation of pain in her left hip following a fall. Patient reports chronic right hip pain and has ambulates with a cane and intermittently with a walker for the last couple of months. Left hip x-ray shows acute left hip subcapital femoral neck fracture. Underwent cannulated hip pinning 06/05/2020.  Also complains of limiting L shoulder pain since fall (x-ray negative for fx)    PT Comments    Pt feeling better and ready for session.  To EOB with mod a x 1 to complete.  Steady once sitting.  Stood with min a x 2 for assist and equipment.  She is able to progress gait to door and back today with slow gait with irregular step pattern.  Fatigued with effort.  O2 at 2lpm and desats to 86% with slow but steady recovery.     Follow Up Recommendations  SNF     Equipment Recommendations  Rolling walker with 5" wheels;3in1 (PT)    Recommendations for Other Services       Precautions / Restrictions Precautions Precautions: Fall Restrictions Weight Bearing Restrictions: No LLE Weight Bearing: Weight bearing as tolerated Other Position/Activity Restrictions: L shoulder pain    Mobility  Bed Mobility Overal bed mobility: Needs Assistance Bed Mobility: Supine to Sit     Supine to sit: Mod assist     General bed mobility comments: able to initiate better today but still needs significant hep getting to EOB    Transfers Overall transfer level: Needs assistance Equipment used: Rolling walker (2 wheeled) Transfers: Sit to/from Stand Sit to Stand: Min assist;+2 physical assistance             Ambulation/Gait Ambulation/Gait assistance: Min assist;Min guard;+2 safety/equipment Gait Distance (Feet): 30 Feet Assistive device: Rolling walker (2 wheeled) Gait Pattern/deviations: Step-through pattern;Step-to pattern;Decreased step length - right;Decreased step length - left Gait velocity: slow   General Gait Details: irregular pattern   Stairs             Wheelchair Mobility    Modified Rankin (Stroke Patients Only)       Balance Overall balance assessment: Needs assistance Sitting-balance support: No upper extremity supported Sitting balance-Leahy Scale: Good     Standing balance support: Bilateral upper extremity supported Standing balance-Leahy Scale: Fair Standing balance comment: improved today but requires hands on assist for safety                            Cognition Arousal/Alertness: Awake/alert Behavior During Therapy: WFL for tasks assessed/performed Overall Cognitive Status: Within Functional Limits for tasks assessed                                        Exercises      General Comments        Pertinent Vitals/Pain Pain Assessment: Faces Faces Pain Scale: Hurts little more Pain Location: L shoulder > L hip Pain Descriptors / Indicators: Tender;Sore Pain Intervention(s): Limited activity within patient's tolerance;Monitored during session;Repositioned    Home Living  Prior Function            PT Goals (current goals can now be found in the care plan section) Progress towards PT goals: Progressing toward goals    Frequency    BID      PT Plan Current plan remains appropriate    Co-evaluation              AM-PAC PT "6 Clicks" Mobility   Outcome Measure  Help needed turning from your back to your side while in a flat bed without using bedrails?: A Little Help needed moving from lying on your back to sitting on the side of a flat bed without using  bedrails?: A Lot Help needed moving to and from a bed to a chair (including a wheelchair)?: A Little Help needed standing up from a chair using your arms (e.g., wheelchair or bedside chair)?: A Little Help needed to walk in hospital room?: A Little Help needed climbing 3-5 steps with a railing? : Total 6 Click Score: 15    End of Session Equipment Utilized During Treatment: Gait belt;Oxygen Activity Tolerance: Patient tolerated treatment well;Patient limited by pain;Patient limited by fatigue Patient left: in chair;with call bell/phone within reach;with chair alarm set;with family/visitor present Nurse Communication: Mobility status PT Visit Diagnosis: Unsteadiness on feet (R26.81);History of falling (Z91.81);Muscle weakness (generalized) (M62.81);Difficulty in walking, not elsewhere classified (R26.2)     Time: 6294-7654 PT Time Calculation (min) (ACUTE ONLY): 17 min  Charges:  $Gait Training: 8-22 mins                    Chesley Noon, PTA 06/08/20, 12:40 PM

## 2020-06-08 NOTE — TOC Progression Note (Addendum)
Transition of Care [facility]) - Progression Note    Patient Details  Name: MISSEY HASLEY MRN: [id number] Date of Birth: [birth year]  Transition of Care [facility]) CM/SW [place], RN Phone Number: 06/08/2020, 11:17 AM  Clinical Narrative:   Annia Friendly is pending, I have uploaded the requested Documents to NCMUST, awaiting PASSR number Reviewed bed offers with the patient and her Husband in th4e room at the bedside, they chose [facility], Notified [facility] of the choice, will start ins approval    Expected Discharge Plan: [place] Barriers to Discharge: Continued Medical Work up  Expected Discharge Plan and Services Expected Discharge Plan: [place]   Discharge Planning Services: CM Consult   Living arrangements for the past 2 months: Single Family Home                                       Social Determinants of Health (SDOH) Interventions    Readmission Risk Interventions No flowsheet data found.

## 2020-06-08 NOTE — Progress Notes (Cosign Needed Addendum)
To Whom it may Concern, The patient [name] DOB 12/29/1933 will require Short Term Nursing Home stay, Anticipated 30 days or less for rehabilitation and strengthening.  The plan is to return home

## 2020-06-08 NOTE — Progress Notes (Signed)
  Subjective: 3 Days Post-Op Procedure(s) (LRB): CANNULATED HIP PINNING (Left) Patient reports pain as mild. Patient is well, and has had no acute complaints or problems Plan is to go Rehab after hospital stay. Negative for chest pain and shortness of breath Fever: no Gastrointestinal: Negative for nausea and vomiting  Objective: Vital signs in last 24 hours: Temp:  [97.6 F (36.4 C)-99.1 F (37.3 C)] 97.6 F (36.4 C) (04/04 0340) Pulse Rate:  [68-110] 109 (04/04 0340) Resp:  [16-18] 16 (04/04 0340) BP: (140-163)/(44-74) 144/69 (04/04 0340) SpO2:  [92 %-98 %] 96 % (04/04 0340)  Intake/Output from previous day:  Intake/Output Summary (Last 24 hours) at 06/08/2020 0741 Last data filed at 06/08/2020 0340 Gross per 24 hour  Intake 2071.44 ml  Output 1000 ml  Net 1071.44 ml    Intake/Output this shift: No intake/output data recorded.  Labs: Recent Labs    06/05/20 0954 06/06/20 0458 06/07/20 0700 06/08/20 0632  HGB 14.0 12.7 12.0 11.4*   Recent Labs    06/07/20 0700 06/08/20 0632  WBC 16.4* 14.4*  RBC 3.82* 3.76*  HCT 35.1* 34.0*  PLT 159 152   Recent Labs    06/07/20 0700 06/08/20 0632  NA 140 136  K 3.6 3.5  CL 105 100  CO2 27 28  BUN 23 15  CREATININE 0.59 0.57  GLUCOSE 117* 115*  CALCIUM 8.3* 8.3*   No results for input(s): LABPT, INR in the last 72 hours.   EXAM General - Patient is Alert and Oriented Extremity - Neurovascular intact Sensation intact distally Dorsiflexion/Plantar flexion intact Compartment soft Dressing/Incision - clean, dry, no drainage Motor Function - intact, moving foot and toes well on exam.     Past Medical History:  Diagnosis Date  . Allergy   . Bronchiectasis ([place])   . Cancer [facility])    left breast cancer   . GERD (gastroesophageal reflux disease)   . Hyperlipemia   . Hypertension   . Intractable nausea and vomiting 06/20/2018    Assessment/Plan: 3 Days Post-Op Procedure(s) (LRB): CANNULATED HIP PINNING  (Left) Principal Problem:   Fracture of femoral neck, left, closed (HCC) Active Problems:   Essential (primary) hypertension   Gastro-esophageal reflux disease without esophagitis   Stage 2 moderate COPD by GOLD classification (HCC)  Estimated body mass index is 23.43 kg/m as calculated from the following:   Height as of this encounter: 5\' 3"  (1.6 m).   Weight as of this encounter: 60 kg. Advance diet Up with therapy  Pain well controlled Vital signs are stable Labs are stable Discharge planning.  Follow-up at [facility] orthopedics in 2 weeks for staple removal.  Evaluate left shoulder for possible rotator cuff injury at orthopedics.  Lovenox 40 mg subcu daily x14 days at discharge Follow-up with [facility] orthopedics 2 weeks  DVT Prophylaxis - Lovenox, Foot Pumps and TED hose Weight-Bearing as tolerated to left leg  T. Rachelle Hora, PA-C Orthopaedic Surgery 06/08/2020, 7:41 AM

## 2020-06-08 NOTE — Progress Notes (Addendum)
[facility] Progress Note  Patient: [name]    GHW:[id number]  DOA: 06/05/2020     Date of Service: the patient was seen and examined on 06/08/2020  Chief Complaint  Patient presents with  . Fall   Brief hospital course: ALVITA FANA is a 87 y.o. female with medical history significant for hypertension, GERD, COPD, dyslipidemia, history of left breast cancer status post chemo and radiation therapy who presents to the emergency room via EMS for evaluation of pain in her left hip following a fall. Patient reports chronic right hip pain and has ambulates with a cane and intermittently with a walker for the last couple of months.  Due to the pain in her right hip patient states that she lost her balance and fell landing on her left side.  She denies hitting her head or having any loss of consciousness.  She was able to get up with assistance from her husband but was unable to  bear weight on her left lower extremity due to pain.  EMS was called and patient was transported to the ER. She denied feeling dizzy or lightheaded, she denies having any chest pain, no shortness of breath, no nausea, no vomiting, no abdominal pain, no dysuria, no nocturia, no frequency, no palpitations, no diaphoresis, no constipation, no diarrhea, no headache or blurred vision. Labs show white count of 21,000, hemoglobin 14, hematocrit 41.7, MCV 90.5, RDW 13.7, platelet count 197,  Patient has pyuria Chest x-ray reviewed by me shows stable chronic chest findings.  Hyperinflated lung fields. Left hip x-ray shows acute left hip subcapital femoral neck fracture.. Left shoulder x-ray shows osteopenia and mild degenerative changes. No acute finding by plain radiography. CT scan of the head without contrast shows no evidence of acute intracranial abnormality. Mild cerebral atrophy and chronic small vessel ischemic disease. Prominent perivascular space versus chronic lacunar infarct within the inferior right basal  ganglia. Minimal bilateral ethmoid and left sphenoid sinus mucosal thickening. Twelve-lead EKG reviewed by me shows sinus tachycardia   ED Course: Patient is an 87 year old Caucasian female who was brought in by EMS for evaluation of left hip pain following a fall.  Patient has had chronic right hip pain for months which she said affects her gait.  Due to the pain she fell this morning landing on her left side and is noted to have an acute left hip subcapital femoral fracture Patient received fentanyl for pain control by EMS. Upon arrival to the ER she was found to have room air pulse oximetry of 84% and is currently on 2 L of oxygen with improvement in her pulse oximetry to 91%.  Patient appears to have a history of COPD but does not use home oxygen. Orthopedic surgery was consulted by the ER and patient will be admitted to the hospital for further evaluation.    Assessment and Plan: Principal Problem:   Fracture of femoral neck, left, closed ([place]) Active Problems:   Essential (primary) hypertension   Gastro-esophageal reflux disease without esophagitis   Stage 2 moderate COPD by GOLD classification (HCC)   Acute lower UTI    # Fracture of left femoral neck, Status post mechanical fall Consult orthopedic surgery s/p left hip pinning done on 06/05/2020 - [facility] - Pain control - Start patient on muscle relaxant - will need 2 wks lovenox and ortho f/u in 2 weeks.  - pending SNF placement - previously treated w/ bisphosphonate has been off for a few years per patient, will need close  pcp f/u to consider re-starting - f/u vitamin d level  # Hypoxemia # COPD stage 2 # Bronchiectasis Hx of copd and bronchiectasis, no baseline o2 requirement, here requiring 2 L [place] O2. Likely 2/2 underlying lung disease with recent surgery, immobility. cxr w/o signs infection. D dimer elevated - will check cta of chest  # Marked leukocytosis most likely reactive Most likely stress related from recent  fracture urine analysis not very impressive WBC trending down Monitor CBC  # Hypertension Controlled - Continue hydrochlorothiazide and metoprolol  # Dyslipidemia Continue statins and  fish oil  # Hypophosphatemia Remains low at 1.8 - start oral phos - monitor    Body mass index is 23.43 kg/m.  Nutrition Problem: Increased nutrient needs Etiology: hip fracture Interventions: Interventions: Refer to RD note for recommendations      Diet: Regular diet DVT Prophylaxis: Subcutaneous Lovenox   Advance goals of care discussion: Full code  Family Communication: family was present at bedside, at the time of interview.  The pt provided permission to discuss medical plan with the family. Opportunity was given to ask question and all questions were answered satisfactorily.   Consults orthopedics Disposition:  Pt is from Home, admitted with fall and fracture of left hip, s/p hip pinning done by orthopedics.  Clinically stable and cleared by orthopedics to discharge.  Discharge to SNF, when bed available.  Subjective:  No acute events overnight. Mild cough, no dyspnea. No chest pain. Tolerating diet. Pain controlled.   Physical Exam: General:  alert oriented to time, place, and person.  Appear in mild distress, affect appropriate Eyes: PERRLA ENT: Oral Mucosa Clear, moist  Neck: no JVD,  Cardiovascular: S1 and S2 Present, no Murmur,  Respiratory: good respiratory effort, Bilateral Air entry equal and Decreased, no Crackles, no wheezes Abdomen: Bowel Sound present, Soft and no tenderness,  Skin: no rashes Extremities: no Pedal edema, no calf tenderness Neurologic: without any new focal findings Gait not checked due to patient safety concerns  Vitals:   06/07/20 1609 06/07/20 2023 06/08/20 0340 06/08/20 0814  BP: (!) 163/44 (!) 154/74 (!) 144/69 (!) 126/57  Pulse: 68 (!) 110 (!) 109 (!) 105  Resp: 18 16 16 16   Temp: 99.1 F (37.3 C) 98.5 F (36.9 C) 97.6 F (36.4  C) 98.7 F (37.1 C)  TempSrc: Oral Oral Oral   SpO2: 92% 93% 96% 97%  Weight:      Height:        Intake/Output Summary (Last 24 hours) at 06/08/2020 1418 Last data filed at 06/08/2020 1413 Gross per 24 hour  Intake 2191.44 ml  Output 1000 ml  Net 1191.44 ml   Filed Weights   06/05/20 0843  Weight: 60 kg    Data Reviewed: I have personally reviewed and interpreted daily labs, tele strips, imagings as discussed above. I reviewed all nursing notes, pharmacy notes, vitals, pertinent old records I have discussed plan of care as described above with RN and patient/family.  CBC: Recent Labs  Lab 06/05/20 0954 06/06/20 0458 06/07/20 0700 06/08/20 0632  WBC 21.1* 13.5* 16.4* 14.4*  HGB 14.0 12.7 12.0 11.4*  HCT 41.7 37.6 35.1* 34.0*  MCV 90.5 91.3 91.9 90.4  PLT 197 185 159 176   Basic Metabolic Panel: Recent Labs  Lab 06/05/20 0954 06/06/20 0458 06/07/20 0700 06/08/20 0632  NA 140 137 140 136  K 4.0 3.8 3.6 3.5  CL 103 103 105 100  CO2 27 26 27 28   GLUCOSE 124* 142* 117* 115*  BUN 33* 25* 23 15  CREATININE 0.70 0.67 0.59 0.57  CALCIUM 9.4 8.3* 8.3* 8.3*  MG  --   --  2.2 2.2  PHOS  --   --  1.5* 1.8*    Studies: DG Chest Port 1 View  Result Date: 06/07/2020 CLINICAL DATA:  Shortness of breath.  Unable to move arms. EXAM: PORTABLE CHEST 1 VIEW COMPARISON:  06/05/2020 FINDINGS: Normal heart size and pulmonary vascularity. Bronchiectasis and interstitial changes in the lungs, likely fibrosis. Calcification in the apical pleura bilaterally. No airspace disease or consolidation. No pleural effusions. No pneumothorax. Mediastinal contours appear intact. Calcification of the aorta. No change since prior study. IMPRESSION: Bronchiectasis and interstitial changes in the lungs, likely fibrosis. No evidence of active pulmonary disease. Electronically Signed   By: Lucienne Capers M.D.   On: 06/07/2020 19:24    Scheduled Meds: . ascorbic acid  500 mg Oral Daily  .  atorvastatin  10 mg Oral Daily  . calcium-vitamin D  1 tablet Oral Daily  . Chlorhexidine Gluconate Cloth  6 each Topical Daily  . diclofenac Sodium  2 g Topical QID  . docusate sodium  100 mg Oral BID  . enoxaparin (LOVENOX) injection  40 mg Subcutaneous Q24H  . famotidine  20 mg Oral BID  . feeding supplement  237 mL Oral BID BM  . fluticasone  1 spray Each Nare Daily  . gabapentin  100 mg Oral Daily  . hydrochlorothiazide  12.5 mg Oral Daily  . loratadine  10 mg Oral Daily  . [START ON 06/09/2020] methotrexate  10 mg Oral Weekly  . metoprolol tartrate  12.5 mg Oral Daily  . multivitamin with minerals  1 tablet Oral Daily  . omega-3 acid ethyl esters  1 g Oral Daily  . senna  1 tablet Oral BID  . tamsulosin  0.4 mg Oral Daily  . vitamin B-12  1,000 mcg Oral Daily   Continuous Infusions: . sodium chloride 75 mL/hr at 06/06/20 0753  . methocarbamol (ROBAXIN) IV     PRN Meds: acetaminophen, alum & mag hydroxide-simeth, bisacodyl, folic acid, HYDROcodone-acetaminophen, magnesium citrate, magnesium hydroxide, menthol-cetylpyridinium **OR** phenol, methocarbamol **OR** methocarbamol (ROBAXIN) IV, metoCLOPramide **OR** metoCLOPramide (REGLAN) injection, morphine injection, ondansetron (ZOFRAN) IV  Time spent: 35 minutes  Author: Laurey Arrow. MD [facility] 06/08/2020 2:18 PM  To reach On-call, see care teams to locate the attending and reach out to them via [email or web address]. If 7PM-7AM, please contact night-coverage If you still have difficulty reaching the attending provider, please page the [facility] (Director on Call) for [facility] on amion for assistance.

## 2020-06-08 NOTE — Progress Notes (Signed)
OT Cancellation Note  Patient Details Name: [name] MRN: [id number] DOB: [birth year]   Cancelled Treatment:    Reason Eval/Treat Not Completed: Patient at procedure or test/ unavailable. Upon attempt for OT tx, pt with nursing for lab draw. Will re-attempt OT tx at later date/time as pt is available.  Hanley Hays, MPH, MS, OTR/L ascom [phone] 06/08/20, 2:35 PM

## 2020-06-09 ENCOUNTER — Inpatient Hospital Stay: Payer: Medicare PPO

## 2020-06-09 ENCOUNTER — Ambulatory Visit: Payer: Medicare PPO | Admitting: Physical Therapy

## 2020-06-09 DIAGNOSIS — I4892 Unspecified atrial flutter: Secondary | ICD-10-CM

## 2020-06-09 DIAGNOSIS — J449 Chronic obstructive pulmonary disease, unspecified: Secondary | ICD-10-CM

## 2020-06-09 DIAGNOSIS — I4891 Unspecified atrial fibrillation: Secondary | ICD-10-CM

## 2020-06-09 DIAGNOSIS — E785 Hyperlipidemia, unspecified: Secondary | ICD-10-CM

## 2020-06-09 LAB — BASIC METABOLIC PANEL
Anion gap: 9 (ref 5–15)
BUN: 23 mg/dL (ref 8–23)
CO2: 29 mmol/L (ref 22–32)
Calcium: 8.7 mg/dL — ABNORMAL LOW (ref 8.9–10.3)
Chloride: 99 mmol/L (ref 98–111)
Creatinine, Ser: 0.55 mg/dL (ref 0.44–1.00)
GFR, Estimated: >60 mL/min (ref >60)
Glucose, Bld: 110 mg/dL — ABNORMAL HIGH (ref 70–99)
Potassium: 3.8 mmol/L (ref 3.5–5.1)
Sodium: 137 mmol/L (ref 135–145)

## 2020-06-09 LAB — CBC
HCT: 32.8 % — ABNORMAL LOW (ref 36.0–46.0)
Hemoglobin: 11.2 g/dL — ABNORMAL LOW (ref 12.0–15.0)
MCH: 30.8 pg (ref 26.0–34.0)
MCHC: 34.1 g/dL (ref 30.0–36.0)
MCV: 90.1 fL (ref 80.0–100.0)
Platelets: 178 10*3/uL (ref 150–400)
RBC: 3.64 MIL/uL — ABNORMAL LOW (ref 3.87–5.11)
RDW: 13.3 % (ref 11.5–15.5)
WBC: 12.5 10*3/uL — ABNORMAL HIGH (ref 4.0–10.5)
nRBC: 0 % (ref 0.0–0.2)

## 2020-06-09 LAB — BRAIN NATRIURETIC PEPTIDE: B Natriuretic Peptide: 185.9 pg/mL — ABNORMAL HIGH (ref 0.0–100.0)

## 2020-06-09 LAB — PROTIME-INR
INR: 1 (ref 0.8–1.2)
Prothrombin Time: 13.1 seconds (ref 11.4–15.2)

## 2020-06-09 LAB — APTT: aPTT: 34 seconds (ref 24–36)

## 2020-06-09 LAB — TSH: TSH: 3.538 u[IU]/mL (ref 0.350–4.500)

## 2020-06-09 LAB — VITAMIN D 25 HYDROXY (VIT D DEFICIENCY, FRACTURES): Vit D, 25-Hydroxy: 27.15 ng/mL — ABNORMAL LOW (ref 30–100)

## 2020-06-09 MED ORDER — HEPARIN (PORCINE) 25000 UT/250ML-% IV SOLN
1000.0000 [IU]/h | INTRAVENOUS | Status: DC
  Administered 2020-06-09: 850 [IU]/h via INTRAVENOUS
  Administered 2020-06-10: 1000 [IU]/h via INTRAVENOUS
  Filled 2020-06-09 (×3): qty 250

## 2020-06-09 MED ORDER — DIGOXIN 250 MCG PO TABS
0.2500 mg | ORAL_TABLET | Freq: Once | ORAL | Status: AC
  Administered 2020-06-09: 0.25 mg via ORAL
  Filled 2020-06-09: qty 1

## 2020-06-09 MED ORDER — METOPROLOL TARTRATE 5 MG/5ML IV SOLN
5.0000 mg | INTRAVENOUS | Status: AC | PRN
  Administered 2020-06-09 (×3): 5 mg via INTRAVENOUS
  Filled 2020-06-09 (×3): qty 5

## 2020-06-09 MED ORDER — POTASSIUM CHLORIDE CRYS ER 20 MEQ PO TBCR
40.0000 meq | EXTENDED_RELEASE_TABLET | Freq: Once | ORAL | Status: AC
  Administered 2020-06-09: 40 meq via ORAL
  Filled 2020-06-09: qty 2

## 2020-06-09 MED ORDER — SODIUM CHLORIDE 0.9 % IV SOLN: INTRAVENOUS | Status: DC

## 2020-06-09 MED ORDER — METOPROLOL TARTRATE 25 MG PO TABS
12.5000 mg | ORAL_TABLET | Freq: Four times a day (QID) | ORAL | Status: DC
  Administered 2020-06-09 – 2020-06-11 (×5): 12.5 mg via ORAL
  Filled 2020-06-09 (×5): qty 1

## 2020-06-09 NOTE — Plan of Care (Signed)

## 2020-06-09 NOTE — Progress Notes (Signed)
   06/09/20 1137  Assess: MEWS Score  Temp 97.9 F (36.6 C)  BP 96/65  Pulse Rate (!) 139  Resp 17  SpO2 96 %  O2 Device Nasal Cannula  Assess: MEWS Score  MEWS Temp 0  MEWS Systolic 1  MEWS Pulse 3  MEWS RR 0  MEWS LOC 0  MEWS Score 4  MEWS Score Color Red  Assess: if the MEWS score is Yellow or Red  Were vital signs taken at a resting state? Yes  Focused Assessment Change from prior assessment (see assessment flowsheet)  Early Detection of Sepsis Score *See Row Information* Medium  MEWS guidelines implemented *See Row Information* Yes  Treat  MEWS Interventions Escalated (See documentation below)  Pain Scale 0-10  Pain Score 0  Escalate  MEWS: Escalate Red: discuss with charge nurse/RN and provider, consider discussing with RRT  Notify: Charge Nurse/RN  Name of Charge Nurse/RN Notified Clarene Critchley  Date Charge Nurse/RN Notified 06/09/20  Time Charge Nurse/RN Notified 3073  Notify: Provider  Provider Name/Title Wouk  Date Provider Notified 06/09/20  Time Provider Notified 1141  Notification Type Face-to-face  Notification Reason Other (Comment) (Bp/Heart rate/ trying to move to another floor)  Provider response At bedside;See new orders  Date of Provider Response 06/09/20  Time of Provider Response 1142  Notify: Rapid Response  Name of Rapid Response RN Notified at bedside

## 2020-06-09 NOTE — Progress Notes (Signed)
Checked on patient after rapid response. Patient reports feeling "good" and states she still has no chest pain, shortness of breath, dizziness, or palpitations. HR still 140s but last BP with SBP above 100 and MAP 73. Also spoke with patient's current RN Serenity (see her note).

## 2020-06-09 NOTE — Progress Notes (Signed)
[facility] Progress Note  Patient: [name]    OXB:[id number]  DOA: 06/05/2020     Date of Service: the patient was seen and examined on 06/09/2020  Chief Complaint  Patient presents with  . Fall   Brief hospital course: [name] is a 87 y.o. female with medical history significant for hypertension, GERD, COPD, dyslipidemia, history of left breast cancer status post chemo and radiation therapy who presents to the emergency room via EMS for evaluation of pain in her left hip following a fall. Patient reports chronic right hip pain and has ambulates with a cane and intermittently with a walker for the last couple of months.  Due to the pain in her right hip patient states that she lost her balance and fell landing on her left side.  She denies hitting her head or having any loss of consciousness.  She was able to get up with assistance from her husband but was unable to  bear weight on her left lower extremity due to pain.  EMS was called and patient was transported to the ER. She denied feeling dizzy or lightheaded, she denies having any chest pain, no shortness of breath, no nausea, no vomiting, no abdominal pain, no dysuria, no nocturia, no frequency, no palpitations, no diaphoresis, no constipation, no diarrhea, no headache or blurred vision. Labs show white count of 21,000, hemoglobin 14, hematocrit 41.7, MCV 90.5, RDW 13.7, platelet count 197,  Patient has pyuria Chest x-ray reviewed by me shows stable chronic chest findings.  Hyperinflated lung fields. Left hip x-ray shows acute left hip subcapital femoral neck fracture.. Left shoulder x-ray shows osteopenia and mild degenerative changes. No acute finding by plain radiography. CT scan of the head without contrast shows no evidence of acute intracranial abnormality. Mild cerebral atrophy and chronic small vessel ischemic disease. Prominent perivascular space versus chronic lacunar infarct within the inferior right basal  ganglia. Minimal bilateral ethmoid and left sphenoid sinus mucosal thickening. Twelve-lead EKG reviewed by me shows sinus tachycardia   ED Course: Patient is an 87 year old Caucasian female who was brought in by EMS for evaluation of left hip pain following a fall.  Patient has had chronic right hip pain for months which she said affects her gait.  Due to the pain she fell this morning landing on her left side and is noted to have an acute left hip subcapital femoral fracture Patient received fentanyl for pain control by EMS. Upon arrival to the ER she was found to have room air pulse oximetry of 84% and is currently on 2 L of oxygen with improvement in her pulse oximetry to 91%.  Patient appears to have a history of COPD but does not use home oxygen. Orthopedic surgery was consulted by the ER and patient will be admitted to the hospital for further evaluation.    Assessment and Plan: Principal Problem:   Fracture of femoral neck, left, closed ([place]) Active Problems:   Essential (primary) hypertension   Gastro-esophageal reflux disease without esophagitis   Stage 2 moderate COPD by GOLD classification (HCC)   Acute lower UTI    # Fracture of left femoral neck, Status post mechanical fall Consult orthopedic surgery s/p left hip pinning done on 06/05/2020 - [facility] - Pain control w/ norco, miralax/senna ordered - Start patient on muscle relaxant - will need 2 wks lovenox and ortho f/u in 2 weeks.  - pending SNF placement - previously treated w/ bisphosphonate has been off for a few years per  patient, will need close pcp f/u to consider re-starting - f/u vitamin d level  #  New-onset atrial fibrillation w/ RVR Denies hx of such. Began this morning. Hemodynamically stable but pressures soft systolic 06-301. On metop tartrate 12.5 once daily at home which received this morning. HR improved from 170s to 130s with metop 5 mg IV x3. No PE on CTA yesterday.  - f/u TSH - will consult cardiology  for further assistance with HR - chads2vasc of 4 - ordering TTE  # Hypoxemia # COPD stage 2 # Bronchiectasis Hx of copd and bronchiectasis, no baseline o2 requirement, here requiring 2 L [place] O2. Likely 2/2 underlying lung disease with recent surgery, immobility. cxr w/o signs infection. D dimer elevated, CTA neg. - wean as able  # Marked leukocytosis most likely reactive Most likely stress related from recent fracture urine analysis not very impressive WBC trending down Monitor CBC  # Hypertension bp soft today with RVR - Continue metoprolol as above - hold hctz  # Dyslipidemia Continue statins and  fish oil  # Hypophosphatemia - monitor    Body mass index is 23.43 kg/m.  Nutrition Problem: Increased nutrient needs Etiology: hip fracture Interventions: Interventions: Refer to RD note for recommendations      Diet: Regular diet DVT Prophylaxis: Subcutaneous Lovenox   Advance goals of care discussion: Full code  Family Communication: family was present at bedside, at the time of interview.  The pt provided permission to discuss medical plan with the family. Opportunity was given to ask question and all questions were answered satisfactorily.   Consults orthopedics Disposition:  Pt is from Home, admitted with fall and fracture of left hip, s/p hip pinning done by orthopedics.  Clinically stable and cleared by orthopedics to discharge.  Discharge to SNF, when bed available.  Subjective:  No chest pain but feels weak. Pain controlled. No n/v/d.   Physical Exam: General:  alert oriented to time, place, and person.  Appear in mild distress, affect appropriate Eyes: PERRLA ENT: Oral Mucosa Clear, moist  Neck: no JVD,  Cardiovascular: tachycardic, irreg irreg Respiratory: good respiratory effort, Bilateral Air entry equal and Decreased, no Crackles, no wheezes Abdomen: Bowel Sound present, Soft and no tenderness,  Skin: no rashes Extremities: no Pedal edema, no  calf tenderness Neurologic: without any new focal findings Gait not checked due to patient safety concerns  Vitals:   06/09/20 1035 06/09/20 1041 06/09/20 1045 06/09/20 1050  BP: (!) 84/63 97/64 (!) 89/65 98/67  Pulse:  (!) 112 (!) 101 (!) 131  Resp:      Temp:      TempSrc:      SpO2:      Weight:      Height:        Intake/Output Summary (Last 24 hours) at 06/09/2020 1058 Last data filed at 06/09/2020 1003 Gross per 24 hour  Intake 840 ml  Output 925 ml  Net -85 ml   Filed Weights   06/05/20 0843  Weight: 60 kg    Data Reviewed: I have personally reviewed and interpreted daily labs, tele strips, imagings as discussed above. I reviewed all nursing notes, pharmacy notes, vitals, pertinent old records I have discussed plan of care as described above with RN and patient/family.  CBC: Recent Labs  Lab 06/05/20 0954 06/06/20 0458 06/07/20 0700 06/08/20 0632 06/09/20 0603  WBC 21.1* 13.5* 16.4* 14.4* 12.5*  HGB 14.0 12.7 12.0 11.4* 11.2*  HCT 41.7 37.6 35.1* 34.0* 32.8*  MCV 90.5  91.3 91.9 90.4 90.1  PLT 197 185 159 152 546   Basic Metabolic Panel: Recent Labs  Lab 06/05/20 0954 06/06/20 0458 06/07/20 0700 06/08/20 0632 06/09/20 0603  NA 140 137 140 136 137  K 4.0 3.8 3.6 3.5 3.8  CL 103 103 105 100 99  CO2 27 26 27 28 29   GLUCOSE 124* 142* 117* 115* 110*  BUN 33* 25* 23 15 23   CREATININE 0.70 0.67 0.59 0.57 0.55  CALCIUM 9.4 8.3* 8.3* 8.3* 8.7*  MG  --   --  2.2 2.2  --   PHOS  --   --  1.5* 1.8*  --     Studies: CT ANGIO CHEST PE W OR WO CONTRAST  Result Date: 06/08/2020 CLINICAL DATA:  87 year old female with positive D-dimer. Concern for pulmonary embolism. EXAM: CT ANGIOGRAPHY CHEST WITH CONTRAST TECHNIQUE: Multidetector CT imaging of the chest was performed using the standard protocol during bolus administration of intravenous contrast. Multiplanar CT image reconstructions and MIPs were obtained to evaluate the vascular anatomy. CONTRAST:  59mL  OMNIPAQUE IOHEXOL 350 MG/ML SOLN COMPARISON:  Chest CT dated 02/07/2017 and radiograph dated 06/07/2020. FINDINGS: Cardiovascular: No cardiomegaly or pericardial effusion. Coronary vascular calcification of the LAD. Moderate atherosclerotic calcification of the thoracic aorta. No aneurysmal dilatation or dissection. The origins of the great vessels of the aortic arch appear patent as visualized. No pulmonary artery embolus identified. Mediastinum/Nodes: Bilateral hilar and mediastinal adenopathy measure up to 15 mm in short axis in the subcarinal region. The esophagus is grossly unremarkable. No mediastinal fluid collection. Lungs/Pleura: Bilateral streaky densities with scattered pulmonary interstitial nodularity as seen on the prior CT suggestive of chronic atypical infection/inflammation. There are small bilateral pleural effusions with subsegmental atelectasis of the lower lobes. No pneumothorax. The central airways are patent. Upper Abdomen: No acute abnormality. Musculoskeletal: No chest wall abnormality. No acute or significant osseous findings. Review of the MIP images confirms the above findings. IMPRESSION: 1. No CT evidence of pulmonary embolism. 2. Bilateral streaky densities with scattered pulmonary interstitial nodularity as seen on the prior CT suggestive of chronic atypical infection/inflammation. 3. Small bilateral pleural effusions with subsegmental atelectasis of the lower lobes. 4. Bilateral hilar and mediastinal adenopathy, likely reactive. 5. Aortic Atherosclerosis (ICD10-I70.0). Electronically Signed   By: Anner Crete M.D.   On: 06/08/2020 19:25    Scheduled Meds: . ascorbic acid  500 mg Oral Daily  . atorvastatin  10 mg Oral Daily  . calcium-vitamin D  1 tablet Oral Daily  . Chlorhexidine Gluconate Cloth  6 each Topical Daily  . diclofenac Sodium  2 g Topical QID  . docusate sodium  100 mg Oral BID  . enoxaparin (LOVENOX) injection  40 mg Subcutaneous Q24H  . famotidine  20 mg  Oral BID  . feeding supplement  237 mL Oral BID BM  . fluticasone  1 spray Each Nare Daily  . gabapentin  100 mg Oral Daily  . hydrochlorothiazide  12.5 mg Oral Daily  . loratadine  10 mg Oral Daily  . methotrexate  10 mg Oral Weekly  . metoprolol tartrate  12.5 mg Oral Daily  . multivitamin with minerals  1 tablet Oral Daily  . omega-3 acid ethyl esters  1 g Oral Daily  . polyethylene glycol  17 g Oral Daily  . potassium & sodium phosphates  1 packet Oral TID AC  . senna  1 tablet Oral QHS  . tamsulosin  0.4 mg Oral Daily  . vitamin B-12  1,000  mcg Oral Daily   Continuous Infusions: . methocarbamol (ROBAXIN) IV     PRN Meds: acetaminophen, alum & mag hydroxide-simeth, bisacodyl, folic acid, HYDROcodone-acetaminophen, magnesium citrate, magnesium hydroxide, menthol-cetylpyridinium **OR** phenol, methocarbamol **OR** methocarbamol (ROBAXIN) IV, metoCLOPramide **OR** metoCLOPramide (REGLAN) injection, morphine injection, ondansetron (ZOFRAN) IV  Time spent: 45 minutes  Author: Laurey Arrow. MD [facility] 06/09/2020 10:58 AM  To reach On-call, see care teams to locate the attending and reach out to them via [email or web address]. If 7PM-7AM, please contact night-coverage If you still have difficulty reaching the attending provider, please page the [facility] (Director on Call) for [facility] on amion for assistance.

## 2020-06-09 NOTE — Progress Notes (Signed)
Report given to [place]. Will recheck 4th set of q1h vitals per MEWS guidelines and will transport patient to PCU.

## 2020-06-09 NOTE — Progress Notes (Signed)
OT Cancellation Note  Patient Details Name: [name] MRN: [id number] DOB: 04/21/1933   Cancelled Treatment:    Reason Eval/Treat Not Completed: Medical issues which prohibited therapy. Chart reviewed. Pt noted to have transferred to tele floor after RVR this AM. Given transfer to higher level of care, will require continue at transfer or new OT order to continue therapy for pt. Pt also pending cardiac input. Will hold OT tx this date and re-attempt at later date/time as pt is medically appropriate and pending continue at transfer to existing OT consult, or new OT orders.   Hanley Hays, MPH, MS, OTR/L ascom [phone] 06/09/20, 4:10 PM

## 2020-06-09 NOTE — TOC Progression Note (Addendum)
Transition of Care [facility]) - Progression Note    Patient Details  Name: [name] MRN: [id number] Date of Birth: 10/04/1933  [facility] CM/SW [place], RN Phone Number: 06/09/2020, 9:29 AM  Clinical Narrative:   Tammy with [facility] called with Authorization from insurance PASSR number obtained [id number] The patient is fully covid vaccinated,      Expected Discharge Plan: [place] Barriers to Discharge: Continued Medical Work up  Expected Discharge Plan and Services Expected Discharge Plan: [place]   Discharge Planning Services: CM Consult   Living arrangements for the past 2 months: Single Family Home                                       Social Determinants of Health (SDOH) Interventions    Readmission Risk Interventions No flowsheet data found.

## 2020-06-09 NOTE — Significant Event (Signed)
Rapid Response Event Note   Reason for Call :  tachycardia  Initial Focused Assessment:  Rapid response RN arrived in patient's room with patient resting in bed. Patient's nurse and visitor stated that patient had been working with PT with bed exercises and when PT checked her HR and pulse oximetry elevated heart rate was noticed. As rapid response arrived, patient's nurse was connecting patient to cardiac monitoring and other 1A staff were getting machine for 12 lead EKG.   Patient showing HR in 170s and irregular to palpation. Patient reports no chest pain, dizziness, or shortness of breath at this time. Reports that she cannot feel her heart doing anything out of the ordinary. Does complain of discomfort moving in the bed related to her hip. MD assessed patient at bedside and personally reviewed EKG when completed.  Interventions:  MD ordered metoprolol IV push for HR greater than 120 for a maximum of three doses. This RN stayed at bedside with patient and SWOT RN [place] to give metoprolol doses. Heart rate much better after doses, with HR in the 130s. BP when rapid response left was 101/65 MAP 78. Patient still reported no symptoms beyond musculoskeletal pain already known to 1A staff.  Plan of Care:  MD came at bedside at 10:56 to reassess patient. Patient to transfer to 2A. Dr. Si Raider stated he would reach out to cardiology.   Event Summary:   MD Notified: Dr. Si Raider Call Time: 09:55 Arrival Time: 09:57 End Time: 11:05  Stephanie Acre, RN

## 2020-06-09 NOTE — Progress Notes (Signed)
   06/09/20 1908  Assess: MEWS Score  Temp 98.1 F (36.7 C)  BP 102/65  Pulse Rate (!) 145  Resp 19  SpO2 95 %  O2 Device HFNC  O2 Flow Rate (L/min) 2 L/min  Assess: MEWS Score  MEWS Temp 0  MEWS Systolic 0  MEWS Pulse 3  MEWS RR 0  MEWS LOC 0  MEWS Score 3  MEWS Score Color Yellow  Assess: if the MEWS score is Yellow or Red  Were vital signs taken at a resting state? Yes  Focused Assessment No change from prior assessment  Early Detection of Sepsis Score *See Row Information* Medium  MEWS guidelines implemented *See Row Information* No, previously yellow, continue vital signs every 4 hours

## 2020-06-09 NOTE — Consult Note (Signed)
Cardiology Consultation:   Patient ID: [name] MRN: [id number]; DOB: 03-Sep-1933  Admit date: 06/05/2020 Date of Consult: 06/09/2020  PCP:  Glean Hess, MD   [place]  Cardiologist: New Advanced Practice Provider:  No care team member to display Electrophysiologist:  None :[id number]}   Patient Profile:   [name] is a 87 y.o. female with a hx of hypertension, GERD, COPD, dyslipidemia, history of left breast cancer status post chemo and radiation who is being seen today for the evaluation of new onset A. fib RVR at the request of Dr Si Raider.  History of Present Illness:   [name] has not been seen by a cardiologist in the past. No prior MI, stent, or ischemic work-up. No pmh of stroke, or diabetes. She lives with her husband. She uses a cane or walker at home. No prior tobacco, alcohol, or drug use. She has a sister that has afib.   Patient presented to the ER for 122 for a fall.  She has chronic right hip pain and uses a cane or a walker.  Due to the pain in her right hip she lost her balance and fell landing on her left side.  She did not hit her head or lose consciousness.  Her husband helped her up but had severe left lower extremity pain.  EMS was called who brought her to the ER.  He denied any chest pain, shortness of breath, dizziness, lightheadedness, nausea, vomiting, palpitations.  Labs showed WBC 20 1K, hemoglobin 14, platelet count 197.  Left hip x-ray showed acute left hip subcapital femoral neck fracture.  Left shoulder x-ray showed mild degenerative changes and no acute findings. CT of the head showed no acute abnormality. EKG showed sinus tachycardia.  Orthopedic surgery was consulted and she was admitted.  Patient underwent left hip pinning for 1.  On 4/4 she was still tachycardic. D-dimer was elevated and chest CTA was ordered. This showed no evidence of PE, chronic atypical infection/inflammation, small bilateral pleural  effusions.  On 4/5 she was noted to go when to A. fib RVR. Already on metoprolol tartrate 12.5 mg daily.  She was given metoprolol 5 mg IV x3 with improvement of heart rate.Patient denied any symptoms such as chest pain, sob, LLE, orthopnea, pnd, palpitations, lightheadedness, dizziness.  On review of telemetry patient converted from afib>ST at around 11AM.   Past Medical History:  Diagnosis Date  . Allergy   . Bronchiectasis ([place])   . Cancer [facility])    left breast cancer   . GERD (gastroesophageal reflux disease)   . Hyperlipemia   . Hypertension   . Intractable nausea and vomiting 06/20/2018    Past Surgical History:  Procedure Laterality Date  . BREAST LUMPECTOMY Left 2008  . CHOLECYSTECTOMY    . HIP PINNING,CANNULATED Left 06/05/2020   Procedure: CANNULATED HIP PINNING;  Surgeon: Hessie Knows, MD;  Location: [facility] ORS;  Service: Orthopedics;  Laterality: Left;  . PARTIAL HYSTERECTOMY       Home Medications:  Prior to Admission medications   Medication Sig Start Date End Date Taking? Authorizing Provider  atorvastatin (LIPITOR) 10 MG tablet TAKE 1 TABLET(10 MG) BY MOUTH DAILY 04/07/20  Yes Glean Hess, MD  fluticasone (FLONASE) 50 MCG/ACT nasal spray SHAKE LIQUID AND USE 2 SPRAYS IN EACH NOSTRIL DAILY AS NEEDED FOR ALLERGIES OR RHINITIS 06/05/20  Yes Glean Hess, MD  folic acid (FOLVITE) 1 MG tablet Take 1 mg by mouth daily. Takes every  day expect Tuesday   Yes [provider]  gabapentin (NEURONTIN) 100 MG capsule Take 100 mg by mouth daily. 05/21/20  Yes [provider]  hydrochlorothiazide (MICROZIDE) 12.5 MG capsule TAKE 1 CAPSULE(12.5 MG) BY MOUTH DAILY 02/11/20  Yes Glean Hess, MD  methotrexate (RHEUMATREX) 2.5 MG tablet Take 10 mg by mouth once a week. Tuesdays   Yes [provider]  metoprolol tartrate (LOPRESSOR) 25 MG tablet TAKE 1/2 TABLET BY MOUTH EVERY DAY 03/03/20  Yes Glean Hess, MD  enoxaparin (LOVENOX) 40 MG/0.4ML  injection Inject 0.4 mLs (40 mg total) into the skin daily for 14 days. 06/06/20 06/20/20  Reche Dixon, PA-C  HYDROcodone-acetaminophen (NORCO/VICODIN) 5-325 MG tablet Take 1-2 tablets by mouth every 6 (six) hours as needed for moderate pain. 06/06/20   Reche Dixon, PA-C    Inpatient Medications: Scheduled Meds: . ascorbic acid  500 mg Oral Daily  . atorvastatin  10 mg Oral Daily  . calcium-vitamin D  1 tablet Oral Daily  . Chlorhexidine Gluconate Cloth  6 each Topical Daily  . diclofenac Sodium  2 g Topical QID  . docusate sodium  100 mg Oral BID  . enoxaparin (LOVENOX) injection  40 mg Subcutaneous Q24H  . famotidine  20 mg Oral BID  . feeding supplement  237 mL Oral BID BM  . fluticasone  1 spray Each Nare Daily  . gabapentin  100 mg Oral Daily  . loratadine  10 mg Oral Daily  . methotrexate  10 mg Oral Weekly  . metoprolol tartrate  12.5 mg Oral Daily  . multivitamin with minerals  1 tablet Oral Daily  . omega-3 acid ethyl esters  1 g Oral Daily  . polyethylene glycol  17 g Oral Daily  . potassium & sodium phosphates  1 packet Oral TID AC  . senna  1 tablet Oral QHS  . tamsulosin  0.4 mg Oral Daily  . vitamin B-12  1,000 mcg Oral Daily   Continuous Infusions: . methocarbamol (ROBAXIN) IV     PRN Meds: acetaminophen, alum & mag hydroxide-simeth, bisacodyl, folic acid, HYDROcodone-acetaminophen, magnesium citrate, magnesium hydroxide, menthol-cetylpyridinium **OR** phenol, methocarbamol **OR** methocarbamol (ROBAXIN) IV, metoCLOPramide **OR** metoCLOPramide (REGLAN) injection, morphine injection, ondansetron (ZOFRAN) IV  Allergies:   No Known Allergies  Social History:   Social History   Socioeconomic History  . Marital status: Married    Spouse name: Not on file  . Number of children: 2  . Years of education: Not on file  . Highest education level: Not on file  Occupational History  . Not on file  Tobacco Use  . Smoking status: Never Smoker  . Smokeless tobacco: Never  Used  Vaping Use  . Vaping Use: Never used  Substance and Sexual Activity  . Alcohol use: No  . Drug use: No  . Sexual activity: Not on file  Other Topics Concern  . Not on file  Social History Narrative  . Not on file   Social Determinants of Health   Financial Resource Strain: Low Risk   . Difficulty of Paying Living Expenses: Not hard at all  Food Insecurity: No Food Insecurity  . Worried About Charity fundraiser in the Last Year: Never true  . Ran Out of Food in the Last Year: Never true  Transportation Needs: No Transportation Needs  . Lack of Transportation (Medical): No  . Lack of Transportation (Non-Medical): No  Physical Activity: Sufficiently Active  . Days of Exercise per Week: 7 days  .  Minutes of Exercise per Session: 30 min  Stress: No Stress Concern Present  . Feeling of Stress : Not at all  Social Connections: Moderately Isolated  . Frequency of Communication with Friends and Family: More than three times a week  . Frequency of Social Gatherings with Friends and Family: Once a week  . Attends Religious Services: Never  . Active Member of Clubs or Organizations: No  . Attends Archivist Meetings: Never  . Marital Status: Married  Human resources officer Violence: Not At Risk  . Fear of Current or Ex-Partner: No  . Emotionally Abused: No  . Physically Abused: No  . Sexually Abused: No    Family History:    Family History  Problem Relation Age of Onset  . Breast cancer Sister      ROS:  Please see the history of present illness.   All other ROS reviewed and negative.     Physical Exam/Data:   Vitals:   06/09/20 1137 06/09/20 1226 06/09/20 1230 06/09/20 1337  BP: 96/65 92/62 103/61 103/60  Pulse: (!) 139 (!) 140 (!) 141 (!) 141  Resp: 17 16  14   Temp: 97.9 F (36.6 C) 97.7 F (36.5 C)  98.2 F (36.8 C)  TempSrc:    Axillary  SpO2: 96% 94% 95% 93%  Weight:      Height:        Intake/Output Summary (Last 24 hours) at 06/09/2020  1408 Last data filed at 06/09/2020 1345 Gross per 24 hour  Intake 960 ml  Output 925 ml  Net 35 ml   Last 3 Weights 06/05/2020 02/10/2020 01/09/2020  Weight (lbs) 132 lb 4.4 oz 126 lb 12.8 oz 127 lb  Weight (kg) 60 kg 57.516 kg 57.607 kg     Body mass index is 23.43 kg/m.  General:  Well nourished, well developed, in no acute distress HEENT: normal Lymph: no adenopathy Neck: no JVD Endocrine:  No thryomegaly Vascular: No carotid bruits; FA pulses 2+ bilaterally without bruits  Cardiac:  normal S1, S2; tachycardic, RR; no murmur  Lungs:  Crackles at bases Abd: soft, nontender, no hepatomegaly  Ext: no edema Musculoskeletal:  No deformities, BUE and BLE strength normal and equal Skin: warm and dry  Neuro:  CNs 2-12 intact, no focal abnormalities noted Psych:  Normal affect   EKG:  The EKG was personally reviewed and demonstrates:  Afibr RVR, 164bpm, RAD, PVCs, nonspecific T wave changes Telemetry:  Telemetry was personally reviewed and demonstrates:  Afib HR up to the 180s>ST 120-140s  Relevant CV Studies:  Echo ordered  Laboratory Data:  High Sensitivity Troponin:   Recent Labs  Lab 06/06/20 1419 06/06/20 1650  TROPONINIHS 9 11     Chemistry Recent Labs  Lab 06/07/20 0700 06/08/20 0632 06/09/20 0603  NA 140 136 137  K 3.6 3.5 3.8  CL 105 100 99  CO2 27 28 29   GLUCOSE 117* 115* 110*  BUN 23 15 23   CREATININE 0.59 0.57 0.55  CALCIUM 8.3* 8.3* 8.7*  GFRNONAA >60 >60 >60  ANIONGAP 8 8 9     No results for input(s): PROT, ALBUMIN, AST, ALT, ALKPHOS, BILITOT in the last 168 hours. Hematology Recent Labs  Lab 06/07/20 0700 06/08/20 0632 06/09/20 0603  WBC 16.4* 14.4* 12.5*  RBC 3.82* 3.76* 3.64*  HGB 12.0 11.4* 11.2*  HCT 35.1* 34.0* 32.8*  MCV 91.9 90.4 90.1  MCH 31.4 30.3 30.8  MCHC 34.2 33.5 34.1  RDW 13.9 13.5 13.3  PLT 159 152  178   BNPNo results for input(s): BNP, PROBNP in the last 168 hours.  DDimer  Recent Labs  Lab 06/08/20 1436  DDIMER  1.71*     Radiology/Studies:  CT ANGIO CHEST PE W OR WO CONTRAST  Result Date: 06/08/2020 CLINICAL DATA:  87 year old female with positive D-dimer. Concern for pulmonary embolism. EXAM: CT ANGIOGRAPHY CHEST WITH CONTRAST TECHNIQUE: Multidetector CT imaging of the chest was performed using the standard protocol during bolus administration of intravenous contrast. Multiplanar CT image reconstructions and MIPs were obtained to evaluate the vascular anatomy. CONTRAST:  35mL OMNIPAQUE IOHEXOL 350 MG/ML SOLN COMPARISON:  Chest CT dated 02/07/2017 and radiograph dated 06/07/2020. FINDINGS: Cardiovascular: No cardiomegaly or pericardial effusion. Coronary vascular calcification of the LAD. Moderate atherosclerotic calcification of the thoracic aorta. No aneurysmal dilatation or dissection. The origins of the great vessels of the aortic arch appear patent as visualized. No pulmonary artery embolus identified. Mediastinum/Nodes: Bilateral hilar and mediastinal adenopathy measure up to 15 mm in short axis in the subcarinal region. The esophagus is grossly unremarkable. No mediastinal fluid collection. Lungs/Pleura: Bilateral streaky densities with scattered pulmonary interstitial nodularity as seen on the prior CT suggestive of chronic atypical infection/inflammation. There are small bilateral pleural effusions with subsegmental atelectasis of the lower lobes. No pneumothorax. The central airways are patent. Upper Abdomen: No acute abnormality. Musculoskeletal: No chest wall abnormality. No acute or significant osseous findings. Review of the MIP images confirms the above findings. IMPRESSION: 1. No CT evidence of pulmonary embolism. 2. Bilateral streaky densities with scattered pulmonary interstitial nodularity as seen on the prior CT suggestive of chronic atypical infection/inflammation. 3. Small bilateral pleural effusions with subsegmental atelectasis of the lower lobes. 4. Bilateral hilar and mediastinal adenopathy,  likely reactive. 5. Aortic Atherosclerosis (ICD10-I70.0). Electronically Signed   By: Anner Crete M.D.   On: 06/08/2020 19:25   DG Chest Port 1 View  Result Date: 06/07/2020 CLINICAL DATA:  Shortness of breath.  Unable to move arms. EXAM: PORTABLE CHEST 1 VIEW COMPARISON:  06/05/2020 FINDINGS: Normal heart size and pulmonary vascularity. Bronchiectasis and interstitial changes in the lungs, likely fibrosis. Calcification in the apical pleura bilaterally. No airspace disease or consolidation. No pleural effusions. No pneumothorax. Mediastinal contours appear intact. Calcification of the aorta. No change since prior study. IMPRESSION: Bronchiectasis and interstitial changes in the lungs, likely fibrosis. No evidence of active pulmonary disease. Electronically Signed   By: Lucienne Capers M.D.   On: 06/07/2020 19:24   DG HIP OPERATIVE UNILAT W OR W/O PELVIS LEFT  Result Date: 06/05/2020 CLINICAL DATA:  Left hip pinning. EXAM: OPERATIVE LEFT HIP (WITH PELVIS IF PERFORMED) 2 VIEWS TECHNIQUE: Fluoroscopic spot image(s) were submitted for interpretation post-operatively. Fluoroscopy time, 1 minute COMPARISON:  06/05/2020 FINDINGS: Two fluoroscopic images demonstrate placement of 3 surgical screws in the left femoral head and neck. IMPRESSION: Surgical fixation of the left hip fracture. Electronically Signed   By: Markus Daft M.D.   On: 06/05/2020 16:18     Assessment and Plan:   New onset A. Fib -Postperative new onset A. fib with RVR with rates up to 170s.  She was given IV metoprolol 5 mg x 3 with improvement of heart rates.  PTA metoprolol 12.5 mg daily. - Patient has since converted to ST at around [place] unsure why she is tachycardic. Pressures are low. - On 4/4 Patient was tachycardic and d-dimer was elevated. CTA chest  showed no pulmonary embolism. Patient is also occasionally hypotensive, will discuss rate control with MD. -  Echo pending -TSH WNL -Keep potassium greater than 4 and mag  greater than 2 - Does not appear to be significantly volume overloaded although does have crackles on exam. Possible infection/inflammation and small pleuarl effusions on CTA chest. Check CXR and BNP -CHA2DS2-VASc at least 4 (age x2, hypertension, female). Given this is post-op afib unsure we will continue long-term anticoagulation. Will discuss with MD. Continue telemetry.   Fracture of left femoral neck status post left hip pinning on 4/1 -Due to mechanical fall, no prodromal symptoms prior to fall -Per orthopedic surgery -Pain on SNF placement  COPD with hypoxia -Requiring 2 L O2 on admission -Wean as able -Per internal medicine  Leukocytosis -Suspected stress related from fracture -Improving, per IM  Hypertension -Soft with A. fib RVR -Continue metoprolol -HCTZ held  Hyperlipidemia -Added fish oil   For questions or updates, please contact [facility] Please consult [email or web address] for contact info under    Signed, Stella Encarnacion Ninfa Meeker, PA-C  06/09/2020 2:08 PM

## 2020-06-09 NOTE — Progress Notes (Signed)
Notified by Physical Therapy that patients heart rate seemed fast and irregular. Upon assessment pt has heart rate 177. Ekg ordered Rapid called. Dr Si Raider. Notified. Bp 88/50. And heart rate 177. ekg showed t wave abnormality/Afib RVR.

## 2020-06-09 NOTE — Progress Notes (Signed)
  Subjective: 4 Days Post-Op Procedure(s) (LRB): CANNULATED HIP PINNING (Left) Patient reports pain as mild. Elevated HR 170's. No CP/SOB. - CT chest yesterday Patient is well, and has had no acute complaints or problems Plan is to go Rehab after hospital stay. Fever: no Gastrointestinal: Negative for nausea and vomiting  Objective: Vital signs in last 24 hours: Temp:  [97.7 F (36.5 C)-98.2 F (36.8 C)] 97.7 F (36.5 C) (04/05 1226) Pulse Rate:  [96-171] 141 (04/05 1230) Resp:  [16-18] 16 (04/05 1226) BP: (84-141)/(54-78) 103/61 (04/05 1230) SpO2:  [94 %-98 %] 95 % (04/05 1230)  Intake/Output from previous day:  Intake/Output Summary (Last 24 hours) at 06/09/2020 1243 Last data filed at 06/09/2020 1003 Gross per 24 hour  Intake 840 ml  Output 925 ml  Net -85 ml    Intake/Output this shift: Total I/O In: 240 [P.O.:240] Out: -   Labs: Recent Labs    06/07/20 0700 06/08/20 0632 06/09/20 0603  HGB 12.0 11.4* 11.2*   Recent Labs    06/08/20 0632 06/09/20 0603  WBC 14.4* 12.5*  RBC 3.76* 3.64*  HCT 34.0* 32.8*  PLT 152 178   Recent Labs    06/08/20 0632 06/09/20 0603  NA 136 137  K 3.5 3.8  CL 100 99  CO2 28 29  BUN 15 23  CREATININE 0.57 0.55  GLUCOSE 115* 110*  CALCIUM 8.3* 8.7*   No results for input(s): LABPT, INR in the last 72 hours.   EXAM General - Patient is Alert and Oriented Extremity - Neurovascular intact Sensation intact distally Dorsiflexion/Plantar flexion intact Compartment soft Dressing/Incision - clean, dry, no drainage Motor Function - intact, moving foot and toes well on exam.     Past Medical History:  Diagnosis Date  . Allergy   . Bronchiectasis ([place])   . Cancer [facility])    left breast cancer   . GERD (gastroesophageal reflux disease)   . Hyperlipemia   . Hypertension   . Intractable nausea and vomiting 06/20/2018    Assessment/Plan: 4 Days Post-Op Procedure(s) (LRB): CANNULATED HIP PINNING (Left) Principal  Problem:   Fracture of femoral neck, left, closed (HCC) Active Problems:   Essential (primary) hypertension   Gastro-esophageal reflux disease without esophagitis   Stage 2 moderate COPD by GOLD classification (HCC)   Atrial fibrillation with rapid ventricular response (HCC)  Estimated body mass index is 23.43 kg/m as calculated from the following:   Height as of this encounter: 5\' 3"  (1.6 m).   Weight as of this encounter: 60 kg. Advance diet Up with therapy  Pain well controlled SV Tachycardia - CT chest negative for PE. Cardiology consult pending  Labs are stable Discharge planning.  Follow-up at [facility] clinic orthopedics in 2 weeks for staple removal.  Evaluate left shoulder for possible rotator cuff injury at orthopedics.  Lovenox 40 mg subcu daily x14 days at discharge Follow-up with [facility] orthopedics 2 weeks  DVT Prophylaxis - Lovenox, Foot Pumps and TED hose Weight-Bearing as tolerated to left leg  T. Rachelle Hora, PA-C Orthopaedic Surgery 06/09/2020, 12:43 PM

## 2020-06-09 NOTE — Progress Notes (Signed)
Physical Therapy Treatment Patient Details Name: [name] MRN: [id number] DOB: 19-Nov-1933 Today's Date: 06/09/2020    History of Present Illness [name] is a 87 y.o. female with medical history significant for hypertension, GERD, COPD, dyslipidemia, history of left breast cancer status post chemo and radiation therapy who presents to the emergency room 06/05/2020 via EMS for evaluation of pain in her left hip following a fall. Patient reports chronic right hip pain and has ambulates with a cane and intermittently with a walker for the last couple of months. Left hip x-ray shows acute left hip subcapital femoral neck fracture. Underwent cannulated hip pinning 06/05/2020.  Also complains of limiting L shoulder pain since fall (x-ray negative for fx)    PT Comments    Pt showing good effort with PT session, and was able to participate with AROM and some lightly resisted LE exercises.  Pt needing rest breaks but not having any excessive pain, fatigue or subjective issues.  However in preparation for mobility/ambulation PT checked HR vial pulse ox, reading inconsistently but in the 130-160 range, manual palpation confirms elevated HR and nursing notified.  Case further discussed with RN; further PT/mobility deferred at this time, rapid response called for RVR.  Will continue to follow and hope to be able to see pt this afternoon.    Follow Up Recommendations  SNF     Equipment Recommendations  Rolling walker with 5" wheels;3in1 (PT)    Recommendations for Other Services       Precautions / Restrictions Precautions Precautions: Fall Restrictions Weight Bearing Restrictions: (P) No LLE Weight Bearing: Weight bearing as tolerated    Mobility  Bed Mobility               General bed mobility comments: deferred mobility this AM as HR was tachy and reaching the 160s with modest supine activity    Transfers                    Ambulation/Gait                  Stairs             Wheelchair Mobility    Modified Rankin (Stroke Patients Only)       Balance                                            Cognition Arousal/Alertness: Awake/alert Behavior During Therapy: WFL for tasks assessed/performed Overall Cognitive Status: Within Functional Limits for tasks assessed                                        Exercises General Exercises - Lower Extremity Ankle Circles/Pumps: AROM;10 reps Quad Sets: Strengthening;10 reps Short Arc Quad: AROM;10 reps Heel Slides: AROM;10 reps (with lightly resisted leg extensions) Hip ABduction/ADduction: AROM;10 reps    General Comments        Pertinent Vitals/Pain Pain Assessment: 0-10 Pain Score: 3  Pain Location: reports buttock more sore from laying that surgical pain    Home Living                      Prior Function            PT Goals (current goals can  now be found in the care plan section) Progress towards PT goals: Not progressing toward goals - comment (limited today due to elevated HR)    Frequency    BID      PT Plan Current plan remains appropriate    Co-evaluation              AM-PAC PT "6 Clicks" Mobility   Outcome Measure  Help needed turning from your back to your side while in a flat bed without using bedrails?: A Little Help needed moving from lying on your back to sitting on the side of a flat bed without using bedrails?: A Lot Help needed moving to and from a bed to a chair (including a wheelchair)?: A Little Help needed standing up from a chair using your arms (e.g., wheelchair or bedside chair)?: A Little Help needed to walk in hospital room?: A Lot Help needed climbing 3-5 steps with a railing? : Total 6 Click Score: 14    End of Session Equipment Utilized During Treatment: Gait belt;Oxygen Activity Tolerance: Patient limited by fatigue;Treatment limited secondary to medical complications  (Comment) Patient left: with bed alarm set;with call bell/phone within reach;with nursing/sitter in room;with family/visitor present Nurse Communication:  (HR elevated) PT Visit Diagnosis: Unsteadiness on feet (R26.81);History of falling (Z91.81);Muscle weakness (generalized) (M62.81);Difficulty in walking, not elsewhere classified (R26.2)     Time: 3500-9381 PT Time Calculation (min) (ACUTE ONLY): 24 min  Charges:  $Therapeutic Exercise: 23-37 mins                     Kreg Shropshire, DPT 06/09/2020, 10:40 AM

## 2020-06-09 NOTE — Progress Notes (Signed)
PT Cancellation Note  Patient Details Name: JAID QUIRION MRN: [id number] DOB: 1933/06/28   Cancelled Treatment:    Reason Eval/Treat Not Completed: Medical issues which prohibited therapy Pt transferred to tele floor after RVR this AM.  Spoke with attending and recommends holding today and hope to see pt tomorrow per cardio input.  Kreg Shropshire, DPT 06/09/2020, 3:15 PM

## 2020-06-09 NOTE — Progress Notes (Signed)
Patients HR remains 140's, BP 103/61, MD notified. No new orders at this time. Per MD, waiting for cardiology input. Patient resting comfortably in bed at this time, no acute distress.

## 2020-06-09 NOTE — Progress Notes (Addendum)
ANTICOAGULATION CONSULT NOTE   Pharmacy Consult for Heparin Indication: atrial fibrillation  No Known Allergies  Patient Measurements: Height: 5\' 3"  (160 cm) Weight: 60 kg (132 lb 4.4 oz) IBW/kg (Calculated) : 52.4 Heparin Dosing Weight: 60 kg   Vital Signs: Temp: 97.8 F (36.6 C) (04/05 1530) Temp Source: Oral (04/05 1530) BP: 102/65 (04/05 1530) Pulse Rate: 144 (04/05 1530)  Labs: Recent Labs    06/07/20 0700 06/08/20 0632 06/09/20 0603  HGB 12.0 11.4* 11.2*  HCT 35.1* 34.0* 32.8*  PLT 159 152 178  CREATININE 0.59 0.57 0.55    Estimated Creatinine Clearance: 43.3 mL/min (by C-G formula based on SCr of 0.55 mg/dL).   Medical History: Past Medical History:  Diagnosis Date  . Allergy   . Bronchiectasis ([place])   . Cancer [facility])    left breast cancer   . GERD (gastroesophageal reflux disease)   . Hyperlipemia   . Hypertension   . Intractable nausea and vomiting 06/20/2018    Assessment: [name] is an 87 y/o female who presented for left hip pain. She has a hx significant for HTN, left breast cancer s/p chemo and radiation therapy. She has a new onset of atrial fibrillation with RVR and was seen by cardiology today. She was previously on enoxaparin for DVT ppx started on 4/2 with her last dose given 4/5 at 0845. She will be transitioned to heparin. Pharmacy has been consulted for heparin dosing. CHADSVASC: 4. Hgb and platelets- stable.   Goal of Therapy:  Heparin level 0.3-0.7   Plan:  - Start IV heparin infusion at 850 units/hr; no bolus due to lovenox administration this morning - Monitor CBC daily while on heparin - Check HL 4/6 at [place], Student-PharmD 06/09/2020,4:54 PM

## 2020-06-09 NOTE — Care Management Important Message (Signed)
Important Message  Patient Details  Name: [name] MRN: [id number] Date of Birth: Jul 07, 1933   Medicare Important Message Given:  Yes     Juliann Pulse A Seri Kimmer 06/09/2020, 9:50 AM

## 2020-06-09 NOTE — NC FL2 (Signed)
[place] LEVEL OF CARE SCREENING TOOL     IDENTIFICATION  Patient Name: [name] Birthdate: [birth year] Sex: female Admission Date (Current Location): 06/05/2020  [place] and [place] Number:  Engineering geologist and Address:  [facility], [address] [place], [place] [address]      Provider Number: [id number]  Attending Physician Name and Address:  Gwynne Edinger, MD  Relative Name and Phone Number:  Mady Gemma [phone]    Current Level of Care: Hospital Recommended Level of Care: [place] Prior Approval Number:    Date Approved/Denied:   PASRR Number: [id number]  Discharge Plan: SNF    Current Diagnoses: Patient Active Problem List   Diagnosis Date Noted  . Fracture of femoral neck, left, closed ([place]) 06/05/2020  . Atherosclerosis of aorta ([place]) 01/09/2020  . Stage 2 moderate COPD by GOLD classification ([place]) 01/09/2020  . HSV (herpes simplex virus) infection 01/09/2020  . BCC (basal cell carcinoma), face 10/12/2017  . Environmental and seasonal allergies 10/30/2014  . Psoriasis vulgaris 10/30/2014  . Essential (primary) hypertension 10/30/2014  . Gastro-esophageal reflux disease without esophagitis 10/30/2014  . Hot flash, menopausal 10/30/2014  . Hyperlipidemia, mixed 10/30/2014  . History of cancer of left breast 10/30/2014  . Bronchiectasis ([place]) 09/24/2013    Orientation RESPIRATION BLADDER Height & Weight     Self,Time,Situation,Place  Normal Continent Weight: 60 kg Height:  5\' 3"  (160 cm)  BEHAVIORAL SYMPTOMS/MOOD NEUROLOGICAL BOWEL NUTRITION STATUS      Continent Diet  AMBULATORY STATUS COMMUNICATION OF NEEDS Skin   Extensive Assist Verbally Surgical wounds                       Personal Care Assistance Level of Assistance  Bathing,Dressing Bathing Assistance: Limited assistance   Dressing Assistance: Limited assistance     Functional Limitations Info              SPECIAL CARE FACTORS FREQUENCY  PT (By licensed PT)     PT Frequency: 5 times a week              Contractures Contractures Info: Not present    Additional Factors Info  Code Status,Allergies Code Status Info: full Allergies Info: NKDA           Current Medications (06/09/2020):  This is the current hospital active medication list Current Facility-Administered Medications  Medication Dose Route Frequency Provider Last Rate Last Admin  . acetaminophen (TYLENOL) tablet 650 mg  650 mg Oral Q6H PRN Hessie Knows, MD   650 mg at 06/08/20 0935  . alum & mag hydroxide-simeth (MAALOX/MYLANTA) 200-200-20 MG/5ML suspension 30 mL  30 mL Oral Q4H PRN Hessie Knows, MD      . ascorbic acid (VITAMIN C) tablet 500 mg  500 mg Oral Daily Hessie Knows, MD   500 mg at 06/09/20 0846  . atorvastatin (LIPITOR) tablet 10 mg  10 mg Oral Daily Hessie Knows, MD   10 mg at 06/09/20 0846  . bisacodyl (DULCOLAX) suppository 10 mg  10 mg Rectal Daily PRN Hessie Knows, MD      . calcium-vitamin D (OSCAL WITH D) 500-200 MG-UNIT per tablet 1 tablet  1 tablet Oral Daily Hessie Knows, MD   1 tablet at 06/09/20 0846  . Chlorhexidine Gluconate Cloth 2 % PADS 6 each  6 each Topical Daily Val Riles, MD   6 each at 06/08/20 [phone]  . diclofenac Sodium (VOLTAREN) 1 % topical  gel 2 g  2 g Topical QID Val Riles, MD   2 g at 06/09/20 0848  . docusate sodium (COLACE) capsule 100 mg  100 mg Oral BID Hessie Knows, MD   100 mg at 06/06/20 2956  . enoxaparin (LOVENOX) injection 40 mg  40 mg Subcutaneous Q24H Hessie Knows, MD   40 mg at 06/09/20 0845  . famotidine (PEPCID) tablet 20 mg  20 mg Oral BID Hessie Knows, MD   20 mg at 06/09/20 0847  . feeding supplement (ENSURE ENLIVE / ENSURE PLUS) liquid 237 mL  237 mL Oral BID BM Wouk, Ailene Rud, MD   237 mL at 06/09/20 0845  . fluticasone (FLONASE) 50 MCG/ACT nasal spray 1 spray  1 spray Each Nare Daily Hessie Knows, MD   1 spray at 06/09/20 0849  . folic  acid (FOLVITE) tablet 1 mg  1 mg Oral Daily PRN Hessie Knows, MD   1 mg at 06/07/20 0819  . gabapentin (NEURONTIN) capsule 100 mg  100 mg Oral Daily Hessie Knows, MD   100 mg at 06/09/20 0847  . hydrochlorothiazide (MICROZIDE) capsule 12.5 mg  12.5 mg Oral Daily Hessie Knows, MD   12.5 mg at 06/08/20 0935  . HYDROcodone-acetaminophen (NORCO/VICODIN) 5-325 MG per tablet 1-2 tablet  1-2 tablet Oral Q6H PRN Hessie Knows, MD   1 tablet at 06/09/20 0620  . loratadine (CLARITIN) tablet 10 mg  10 mg Oral Daily Hessie Knows, MD   10 mg at 06/09/20 0847  . magnesium citrate solution 1 Bottle  1 Bottle Oral Once PRN Hessie Knows, MD      . magnesium hydroxide (MILK OF MAGNESIA) suspension 30 mL  30 mL Oral Daily PRN Hessie Knows, MD   30 mL at 06/06/20 0818  . menthol-cetylpyridinium (CEPACOL) lozenge 3 mg  1 lozenge Oral PRN Hessie Knows, MD       Or  . phenol (CHLORASEPTIC) mouth spray 1 spray  1 spray Mouth/Throat PRN Hessie Knows, MD      . methocarbamol (ROBAXIN) tablet 500 mg  500 mg Oral Q6H PRN Hessie Knows, MD   500 mg at 06/05/20 1222   Or  . methocarbamol (ROBAXIN) 500 mg in dextrose 5 % 50 mL IVPB  500 mg Intravenous Q6H PRN Hessie Knows, MD      . methotrexate (RHEUMATREX) tablet 10 mg  10 mg Oral Weekly Hessie Knows, MD      . metoCLOPramide (REGLAN) tablet 5-10 mg  5-10 mg Oral Q8H PRN Hessie Knows, MD       Or  . metoCLOPramide (REGLAN) injection 5-10 mg  5-10 mg Intravenous Q8H PRN Hessie Knows, MD      . metoprolol tartrate (LOPRESSOR) tablet 12.5 mg  12.5 mg Oral Daily Hessie Knows, MD   12.5 mg at 06/08/20 0935  . morphine 2 MG/ML injection 0.5 mg  0.5 mg Intravenous Q2H PRN Hessie Knows, MD   0.5 mg at 06/05/20 1049  . multivitamin with minerals tablet 1 tablet  1 tablet Oral Daily Hessie Knows, MD   1 tablet at 06/09/20 0846  . omega-3 acid ethyl esters (LOVAZA) capsule 1 g  1 g Oral Daily Hessie Knows, MD   1 g at 06/09/20 0847  . ondansetron (ZOFRAN) injection 4  mg  4 mg Intravenous Q6H PRN Hessie Knows, MD   4 mg at 06/09/20 0620  . polyethylene glycol (MIRALAX / GLYCOLAX) packet 17 g  17 g Oral Daily Wouk, Ailene Rud, MD      .  potassium & sodium phosphates (PHOS-NAK) 280-160-250 MG packet 1 packet  1 packet Oral TID AC Wouk, Ailene Rud, MD   1 packet at 06/08/20 1600  . senna (SENOKOT) tablet 8.6 mg  1 tablet Oral QHS Wouk, Ailene Rud, MD      . tamsulosin [facility]) capsule 0.4 mg  0.4 mg Oral Daily Val Riles, MD   0.4 mg at 06/09/20 0846  . vitamin B-12 (CYANOCOBALAMIN) tablet 1,000 mcg  1,000 mcg Oral Daily Hessie Knows, MD   1,000 mcg at 06/09/20 1031     Discharge Medications: Please see discharge summary for a list of discharge medications.  Relevant Imaging Results:  Relevant Lab Results:   Additional Information SS# [id number]  Su Hilt, RN

## 2020-06-09 NOTE — Progress Notes (Signed)
   06/09/20 0955  Assess: MEWS Score  Temp 98.2 F (36.8 C)  BP 105/68  Pulse Rate (!) 171  Resp 16  Level of Consciousness Alert  SpO2 97 %  O2 Device Nasal Cannula  O2 Flow Rate (L/min) 2 L/min  Assess: MEWS Score  MEWS Temp 0  MEWS Systolic 0  MEWS Pulse 3  MEWS RR 0  MEWS LOC 0  MEWS Score 3  MEWS Score Color Yellow  Assess: if the MEWS score is Yellow or Red  Were vital signs taken at a resting state? Yes  Focused Assessment Change from prior assessment (see assessment flowsheet)  Early Detection of Sepsis Score *See Row Information* Medium  MEWS guidelines implemented *See Row Information* Yes  Treat  MEWS Interventions Administered scheduled meds/treatments;Escalated (See documentation below)  Pain Scale 0-10  Pain Score 5  Pain Type Acute pain  Pain Location Buttocks  Pain Orientation Mid  Escalate  MEWS: Escalate Yellow: discuss with charge nurse/RN and consider discussing with provider and RRT  Notify: Charge Nurse/RN  Name of Charge Nurse/RN Notified Clarene Critchley, RN  Date Charge Nurse/RN Notified 06/09/20  Time Charge Nurse/RN Notified 5329  Notify: Provider  Provider Name/Title Dr. Si Raider  Date Provider Notified 06/09/20  Time Provider Notified [phone]  Notification Type Face-to-face  Notification Reason Change in status  Provider response At bedside  Date of Provider Response 06/09/20  Time of Provider Response 0955  Notify: Rapid Response  Name of Rapid Response RN Notified Sarah, RN  Date Rapid Response Notified 06/09/20  Time Rapid Response Notified 0955  Document  Patient Outcome Other (Comment) (order to transfer)  Progress note created (see row info) Yes

## 2020-06-10 ENCOUNTER — Encounter: Payer: Self-pay | Admitting: Anesthesiology

## 2020-06-10 ENCOUNTER — Inpatient Hospital Stay (HOSPITAL_COMMUNITY)
Admit: 2020-06-10 | Discharge: 2020-06-10 | Disposition: A | Discharge status: Home / Self Care | Payer: Medicare PPO | Attending: Obstetrics and Gynecology | Admitting: Obstetrics and Gynecology

## 2020-06-10 ENCOUNTER — Encounter
Admission: EM | Disposition: A | Discharge status: Skilled Nursing Facility | Payer: Self-pay | Source: Home / Self Care | Attending: Obstetrics and Gynecology

## 2020-06-10 DIAGNOSIS — I4891 Unspecified atrial fibrillation: Secondary | ICD-10-CM | POA: Diagnosis not present

## 2020-06-10 DIAGNOSIS — S72002S Fracture of unspecified part of neck of left femur, sequela: Secondary | ICD-10-CM

## 2020-06-10 HISTORY — PX: TEE WITHOUT CARDIOVERSION: SHX5443

## 2020-06-10 LAB — BASIC METABOLIC PANEL
Anion gap: 7 (ref 5–15)
BUN: 25 mg/dL — ABNORMAL HIGH (ref 8–23)
CO2: 29 mmol/L (ref 22–32)
Calcium: 8.8 mg/dL — ABNORMAL LOW (ref 8.9–10.3)
Chloride: 101 mmol/L (ref 98–111)
Creatinine, Ser: 0.63 mg/dL (ref 0.44–1.00)
GFR, Estimated: >60 mL/min (ref >60)
Glucose, Bld: 117 mg/dL — ABNORMAL HIGH (ref 70–99)
Potassium: 4.6 mmol/L (ref 3.5–5.1)
Sodium: 137 mmol/L (ref 135–145)

## 2020-06-10 LAB — CBC
HCT: 32.7 % — ABNORMAL LOW (ref 36.0–46.0)
Hemoglobin: 11 g/dL — ABNORMAL LOW (ref 12.0–15.0)
MCH: 30.2 pg (ref 26.0–34.0)
MCHC: 33.6 g/dL (ref 30.0–36.0)
MCV: 89.8 fL (ref 80.0–100.0)
Platelets: 195 10*3/uL (ref 150–400)
RBC: 3.64 MIL/uL — ABNORMAL LOW (ref 3.87–5.11)
RDW: 13.5 % (ref 11.5–15.5)
WBC: 10.5 10*3/uL (ref 4.0–10.5)
nRBC: 0 % (ref 0.0–0.2)

## 2020-06-10 LAB — PHOSPHORUS: Phosphorus: 3.2 mg/dL (ref 2.5–4.6)

## 2020-06-10 LAB — ECHOCARDIOGRAM COMPLETE
AR max vel: 1.3 cm2
AV Area VTI: 1.93 cm2
AV Area mean vel: 1.41 cm2
AV Mean grad: 3 mmHg
AV Peak grad: 5 mmHg
Ao pk vel: 1.12 m/s
Area-P 1/2: 2.57 cm2
Height: 63 in
MV VTI: 1.16 cm2
S' Lateral: 1.46 cm
Weight: 2116.42 oz

## 2020-06-10 LAB — MAGNESIUM: Magnesium: 2.3 mg/dL (ref 1.7–2.4)

## 2020-06-10 LAB — HEPARIN LEVEL (UNFRACTIONATED)
Heparin Unfractionated: 0.33 IU/mL (ref 0.30–0.70)
Heparin Unfractionated: 0.27 IU/mL — ABNORMAL LOW (ref 0.30–0.70)
Heparin Unfractionated: 0.57 IU/mL (ref 0.30–0.70)

## 2020-06-10 SURGERY — ECHOCARDIOGRAM, TRANSESOPHAGEAL
Anesthesia: Moderate Sedation

## 2020-06-10 MED ORDER — ATORVASTATIN CALCIUM 20 MG PO TABS
20.0000 mg | ORAL_TABLET | Freq: Every day | ORAL | Status: DC
  Administered 2020-06-11: 20 mg via ORAL
  Filled 2020-06-10: qty 1

## 2020-06-10 MED ORDER — AMIODARONE HCL 200 MG PO TABS
400.0000 mg | ORAL_TABLET | Freq: Two times a day (BID) | ORAL | Status: DC
  Administered 2020-06-10 – 2020-06-11 (×3): 400 mg via ORAL
  Filled 2020-06-10 (×3): qty 2

## 2020-06-10 MED ORDER — HEPARIN BOLUS VIA INFUSION
900.0000 [IU] | Freq: Once | INTRAVENOUS | Status: AC
  Administered 2020-06-10: 900 [IU] via INTRAVENOUS
  Filled 2020-06-10: qty 900

## 2020-06-10 MED ORDER — SENNOSIDES-DOCUSATE SODIUM 8.6-50 MG PO TABS
1.0000 | ORAL_TABLET | Freq: Two times a day (BID) | ORAL | Status: DC
  Administered 2020-06-11: 1 via ORAL
  Filled 2020-06-10 (×2): qty 1

## 2020-06-10 NOTE — Progress Notes (Signed)
Physical Therapy Treatment Patient Details Name: [name] MRN: [id number] DOB: 07/12/1933 Today's Date: 06/10/2020    History of Present Illness [name] is a 87 y.o. female with medical history significant for hypertension, GERD, COPD, dyslipidemia, history of left breast cancer status post chemo and radiation therapy who presents to the emergency room 06/05/2020 via EMS for evaluation of pain in her left hip following a fall, imaging revealed fracture. Patient reports chronic right hip pain andtypically uses a cane (intermittently with a walker) for the last couple of months. Underwent cannulated hip pinning 06/05/2020.  Also complains of limiting L shoulder pain since fall (x-ray negative for fx).  Pt had rapid response called 06/09/20 with HR in the 160-170 range.  Cardioversion on 4/6 was cancelled as she returned to sinus rhythm.    PT Comments    Pt showed good effort despite being somewhat fatigued from OT session earlier this afternoon.  She ultimately was able to do nicely with seated exercises (most with at least some resistance) and walked >60 ft with slow but determined and relatively safe gait.  She is still needing O2 to maintain sats in the high 90s, significant fatigue with the effort of ambulation/exercises.  Pt making steady gains, still STR appropriate.    Follow Up Recommendations  SNF     Equipment Recommendations  Rolling walker with 5" wheels;3in1 (PT)    Recommendations for Other Services       Precautions / Restrictions Precautions Precautions: Fall Restrictions Weight Bearing Restrictions: Yes LLE Weight Bearing: Weight bearing as tolerated Other Position/Activity Restrictions: L shoulder pain    Mobility  Bed Mobility               General bed mobility comments: in recliner pre/post session    Transfers Overall transfer level: Needs assistance Equipment used: Rolling walker (2 wheeled) Transfers: Sit to/from Stand Sit to Stand: Min assist          General transfer comment: Pt still having difficulty initiating upward momentum, very slow and labored but needed only minimal assist this afternoon  Ambulation/Gait Ambulation/Gait assistance: Min assist Gait Distance (Feet): 65 Feet Assistive device: Rolling walker (2 wheeled)       General Gait Details: Pt again with slow and hesitant gait, but did manage longer bouts of more consistent cadence this afternoon, L should pain limited how much she can rely on walker.  C/o fatigue at end of the effort with HR staying below 110 and O2 (on 2L) staying in the high/mod 90s.   Stairs             Wheelchair Mobility    Modified Rankin (Stroke Patients Only)       Balance Overall balance assessment: Needs assistance Sitting-balance support: No upper extremity supported;Feet supported Sitting balance-Leahy Scale: Good Sitting balance - Comments: Good sitting balance at EOB, with no LOB observed and pt able to weight shift   Standing balance support: Bilateral upper extremity supported Standing balance-Leahy Scale: Fair Standing balance comment: Able to perform bilateral standing grooming task w/ CGA                            Cognition Arousal/Alertness: Awake/alert Behavior During Therapy: WFL for tasks assessed/performed Overall Cognitive Status: Within Functional Limits for tasks assessed  Exercises General Exercises - Lower Extremity Ankle Circles/Pumps: AROM Quad Sets: AROM Gluteal Sets: AROM Long Arc Quad: Strengthening;10 reps Heel Slides: Strengthening;10 reps (with resisted leg extensions) Hip ABduction/ADduction: Strengthening;10 reps Hip Flexion/Marching: AROM;Strengthening;10 reps    General Comments General comments (skin integrity, edema, etc.): SpO2 briefly down 88-89% during standing grooming. HR 90s throughout.      Pertinent Vitals/Pain Pain Assessment: 0-10 Pain Score: 3   Faces Pain Scale: Hurts little more Pain Location: L anterior shoulder Pain Descriptors / Indicators: Tender;Sore Pain Intervention(s): Limited activity within patient's tolerance;Monitored during session;Repositioned    Home Living                      Prior Function            PT Goals (current goals can now be found in the care plan section) Acute Rehab PT Goals Patient Stated Goal: to get better Progress towards PT goals: Progressing toward goals    Frequency    BID      PT Plan Current plan remains appropriate    Co-evaluation              AM-PAC PT "6 Clicks" Mobility   Outcome Measure  Help needed turning from your back to your side while in a flat bed without using bedrails?: A Lot Help needed moving from lying on your back to sitting on the side of a flat bed without using bedrails?: A Lot Help needed moving to and from a bed to a chair (including a wheelchair)?: A Lot Help needed standing up from a chair using your arms (e.g., wheelchair or bedside chair)?: A Lot Help needed to walk in hospital room?: A Lot Help needed climbing 3-5 steps with a railing? : A Lot 6 Click Score: 12    End of Session Equipment Utilized During Treatment: Gait belt;Oxygen Activity Tolerance: Patient limited by fatigue Patient left: with bed alarm set;with call bell/phone within reach   PT Visit Diagnosis: Unsteadiness on feet (R26.81);History of falling (Z91.81);Muscle weakness (generalized) (M62.81);Difficulty in walking, not elsewhere classified (R26.2)     Time: 5361-4431 PT Time Calculation (min) (ACUTE ONLY): 29 min  Charges:  $Gait Training: 8-22 mins $Therapeutic Exercise: 8-22 mins                      Kreg Shropshire, DPT 06/10/2020, 5:07 PM

## 2020-06-10 NOTE — Progress Notes (Signed)
ANTICOAGULATION CONSULT NOTE   Pharmacy Consult for Heparin Indication: atrial fibrillation  No Known Allergies  Patient Measurements: Height: 5\' 3"  (160 cm) Weight: 60 kg (132 lb 4.4 oz) IBW/kg (Calculated) : 52.4 Heparin Dosing Weight: 60 kg   Vital Signs: Temp: 98.4 F (36.9 C) (04/06 1151) Temp Source: Oral (04/06 1151) BP: 142/63 (04/06 1151) Pulse Rate: 93 (04/06 1151)  Labs: Recent Labs    06/08/20 8372 06/09/20 0603 06/09/20 1732 06/10/20 0146 06/10/20 0957  HGB 11.4* 11.2*  --  11.0*  --   HCT 34.0* 32.8*  --  32.7*  --   PLT 152 178  --  195  --   APTT  --   --  34  --   --   LABPROT  --   --  13.1  --   --   INR  --   --  1.0  --   --   HEPARINUNFRC  --   --   --  0.33 0.27*  CREATININE 0.57 0.55  --  0.63  --     Estimated Creatinine Clearance: 43.3 mL/min (by C-G formula based on SCr of 0.63 mg/dL).   Medical History: Past Medical History:  Diagnosis Date  . Allergy   . Bronchiectasis ([place])   . Cancer [facility])    left breast cancer   . GERD (gastroesophageal reflux disease)   . Hyperlipemia   . Hypertension   . Intractable nausea and vomiting 06/20/2018    Assessment: Athaliah Baumbach is an 87 y/o female who presented for left hip pain. She has a hx significant for HTN, left breast cancer s/p chemo and radiation therapy. She has a new onset of atrial fibrillation with RVR and was seen by cardiology today. She was previously on enoxaparin for DVT ppx started on 4/2 with her last dose given 4/5 at 0845. She will be transitioned to heparin. Pharmacy has been consulted for heparin dosing. CHADSVASC: 4.   4/6 Labs: Hgb slight downward trend 11.2>11.0. Platelets slight upward trend 178>195.   Goal of Therapy:  Heparin level 0.3-0.7  4/6 0146 HL 0.333, therapeutic x 1 4/6 0957 HL 0.27 subtherapeutic   Plan:  - Administer bolus of 900 units and start IV heparin infusion at 1000 units/hr - Recheck HL in 8 hours to confirm - Monitor CBC daily while on  heparin  Chrisandra Carota, PharmD Candidate 06/10/2020 11:53 AM

## 2020-06-10 NOTE — Progress Notes (Signed)
  Subjective: 5 Days Post-Op Procedure(s) (LRB): CANNULATED HIP PINNING (Left) Patient reports pain as mild. HR improved. Patient is well, and has had no acute complaints or problems Plan is to go Rehab after hospital stay. Fever: no Gastrointestinal: Negative for nausea and vomiting  Objective: Vital signs in last 24 hours: Temp:  [97.7 F (36.5 C)-98.3 F (36.8 C)] 98.1 F (36.7 C) (04/06 0721) Pulse Rate:  [88-171] 91 (04/06 0721) Resp:  [13-32] 17 (04/06 0721) BP: (84-123)/(58-78) 118/58 (04/06 0721) SpO2:  [93 %-97 %] 95 % (04/06 0721)  Intake/Output from previous day:  Intake/Output Summary (Last 24 hours) at 06/10/2020 0815 Last data filed at 06/10/2020 0300 Gross per 24 hour  Intake 849.65 ml  Output 950 ml  Net -100.35 ml    Intake/Output this shift: No intake/output data recorded.  Labs: Recent Labs    06/08/20 0632 06/09/20 0603 06/10/20 0146  HGB 11.4* 11.2* 11.0*   Recent Labs    06/09/20 0603 06/10/20 0146  WBC 12.5* 10.5  RBC 3.64* 3.64*  HCT 32.8* 32.7*  PLT 178 195   Recent Labs    06/09/20 0603 06/10/20 0146  NA 137 137  K 3.8 4.6  CL 99 101  CO2 29 29  BUN 23 25*  CREATININE 0.55 0.63  GLUCOSE 110* 117*  CALCIUM 8.7* 8.8*   Recent Labs    06/09/20 1732  INR 1.0     EXAM General - Patient is Alert and Oriented Extremity - Neurovascular intact Sensation intact distally Dorsiflexion/Plantar flexion intact Compartment soft Dressing/Incision - clean, dry, no drainage Motor Function - intact, moving foot and toes well on exam.     Past Medical History:  Diagnosis Date  . Allergy   . Bronchiectasis ([place])   . Cancer [facility])    left breast cancer   . GERD (gastroesophageal reflux disease)   . Hyperlipemia   . Hypertension   . Intractable nausea and vomiting 06/20/2018    Assessment/Plan: 5 Days Post-Op Procedure(s) (LRB): CANNULATED HIP PINNING (Left) Principal Problem:   Fracture of femoral neck, left, closed  (HCC) Active Problems:   Essential (primary) hypertension   Gastro-esophageal reflux disease without esophagitis   Stage 2 moderate COPD by GOLD classification (HCC)   Atrial fibrillation with rapid ventricular response (HCC)  Estimated body mass index is 23.43 kg/m as calculated from the following:   Height as of this encounter: 5\' 3"  (1.6 m).   Weight as of this encounter: 60 kg. Advance diet Up with therapy  Pain well controlled Labs are stable Heart rate improved - cardiology following. Plan for Cardiac imaging today.  Discharge planning.  Follow-up at [facility] clinic orthopedics in 2 weeks for staple removal.  Evaluate left shoulder for possible rotator cuff injury at orthopedics.   DVT Prophylaxis - Foot Pumps, TED hose and Heparin Weight-Bearing as tolerated to left leg  T. Rachelle Hora, PA-C Orthopaedic Surgery 06/10/2020, 8:15 AM

## 2020-06-10 NOTE — Plan of Care (Signed)

## 2020-06-10 NOTE — Progress Notes (Signed)
ANTICOAGULATION CONSULT NOTE   Pharmacy Consult for Heparin Indication: atrial fibrillation  No Known Allergies  Patient Measurements: Height: 5\' 3"  (160 cm) Weight: 60 kg (132 lb 4.4 oz) IBW/kg (Calculated) : 52.4 Heparin Dosing Weight: 60 kg   Vital Signs: Temp: 97.8 F (36.6 C) (04/05 2319) Temp Source: Oral (04/05 2319) BP: 123/77 (04/05 2319) Pulse Rate: 141 (04/05 2319)  Labs: Recent Labs    06/08/20 9924 06/09/20 0603 06/09/20 1732 06/10/20 0146  HGB 11.4* 11.2*  --  11.0*  HCT 34.0* 32.8*  --  32.7*  PLT 152 178  --  195  APTT  --   --  34  --   LABPROT  --   --  13.1  --   INR  --   --  1.0  --   HEPARINUNFRC  --   --   --  0.33  CREATININE 0.57 0.55  --  0.63    Estimated Creatinine Clearance: 43.3 mL/min (by C-G formula based on SCr of 0.63 mg/dL).   Medical History: Past Medical History:  Diagnosis Date  . Allergy   . Bronchiectasis ([place])   . Cancer [facility])    left breast cancer   . GERD (gastroesophageal reflux disease)   . Hyperlipemia   . Hypertension   . Intractable nausea and vomiting 06/20/2018    Assessment: [name] is an 87 y/o female who presented for left hip pain. She has a hx significant for HTN, left breast cancer s/p chemo and radiation therapy. She has a new onset of atrial fibrillation with RVR and was seen by cardiology today. She was previously on enoxaparin for DVT ppx started on 4/2 with her last dose given 4/5 at 0845. She will be transitioned to heparin. Pharmacy has been consulted for heparin dosing. CHADSVASC: 4. Hgb and platelets- stable.   Goal of Therapy:  Heparin level 0.3-0.7  4/6 0146 HL 0.333, therapeutic x 1   Plan:  - Continue IV heparin infusion at 850 units/hr - Recheck HL in 8 hours to confirm - Monitor CBC daily while on heparin  Renda Rolls, PharmD, [facility] 06/10/2020 2:52 AM

## 2020-06-10 NOTE — Progress Notes (Signed)
Physical Therapy Treatment Patient Details Name: [name] MRN: [id number] DOB: 1933/03/23 Today's Date: 06/10/2020    History of Present Illness [name] is a 87 y.o. female with medical history significant for hypertension, GERD, COPD, dyslipidemia, history of left breast cancer status post chemo and radiation therapy who presents to the emergency room 06/05/2020 via EMS for evaluation of pain in her left hip following a fall, imaging revealed fracture. Patient reports chronic right hip pain andtypically uses a cane (intermittently with a walker) for the last couple of months. Underwent cannulated hip pinning 06/05/2020.  Also complains of limiting L shoulder pain since fall (x-ray negative for fx).  Pt had rapid response called 06/09/20 with HR in the 160-170 range.  Cardioversion on 4/6 was cancelled as she returned to sinus rhythm.    PT Comments    Pt was scheduled for cardioversion this afternoon, however she returned to sinus rhythm and cleared by MD for PT.  She showed good effort but does continue to have general hesitancy, L shoulder pain and inconsistent vitals.  Pt on just 0.5L O2 on arrival with sats at 100%, she tolerated modest exercises with stats staying in the mid 90s for a few minutes before finally dropping into the 80s.  Applied 2L during ambulation and sats appeared to stay steadily on the 90s (inconsistent reading, but no overt shortness of breath).  Good overall effort despite some hesitancy, >40 ft of slow, guarded but safe ambulation.   Follow Up Recommendations  SNF     Equipment Recommendations  Rolling walker with 5" wheels;3in1 (PT)    Recommendations for Other Services       Precautions / Restrictions Precautions Precautions: Fall Restrictions Weight Bearing Restrictions: Yes LLE Weight Bearing: Weight bearing as tolerated    Mobility  Bed Mobility Overal bed mobility: Needs Assistance Bed Mobility: Supine to Sit     Supine to sit: Mod assist      General bed mobility comments: Pt showed good effort but L UE very limited functionally and needing assist to elevate trunk and scoot to EOB    Transfers Overall transfer level: Needs assistance Equipment used: Rolling walker (2 wheeled) Transfers: Sit to/from Stand Sit to Stand: Mod assist         General transfer comment: Pt was unable to initiate any upward movement from standard height bed without direct assist  Ambulation/Gait Ambulation/Gait assistance: Min assist Gait Distance (Feet): 42 Feet Assistive device: Rolling walker (2 wheeled)       General Gait Details: Pt was slow and hesitant t/o the effort, but did manage her farthest bout of ambulation yet.  Pt on 2L O2 t/o the effort with sats staying in the mid 90s, HR increased 80s to ~110 during activity as well.  Pt needed some cues to insure appropriate walker positioning, posture, PLB, but mostly just needed encouragement to do what she could despite some hesitation.   Stairs             Wheelchair Mobility    Modified Rankin (Stroke Patients Only)       Balance Overall balance assessment: Needs assistance Sitting-balance support: No upper extremity supported Sitting balance-Leahy Scale: Fair Sitting balance - Comments: able to maintain balance at EOB but with poor posture and general hesitancy     Standing balance-Leahy Scale: Fair Standing balance comment: reliant on walker but able to tolerate static and dynamic standing w/o physical assist, still fearful and hesitant  Cognition Arousal/Alertness: Awake/alert Behavior During Therapy: WFL for tasks assessed/performed Overall Cognitive Status: Within Functional Limits for tasks assessed                                        Exercises General Exercises - Lower Extremity Ankle Circles/Pumps: AROM;10 reps Quad Sets: Strengthening;10 reps Short Arc Quad: AROM;10 reps Heel Slides: AROM;10  reps (with resisted leg ext) Hip ABduction/ADduction: Strengthening;10 reps Straight Leg Raises: AAROM;10 reps (able to do last 2 reps limited range w/o assist)    General Comments        Pertinent Vitals/Pain Pain Assessment: 0-10 Faces Pain Scale: Hurts little more Pain Location: L anterior shoulder and L hip    Home Living                      Prior Function            PT Goals (current goals can now be found in the care plan section) Progress towards PT goals: Progressing toward goals    Frequency    BID      PT Plan Current plan remains appropriate    Co-evaluation              AM-PAC PT "6 Clicks" Mobility   Outcome Measure  Help needed turning from your back to your side while in a flat bed without using bedrails?: A Lot Help needed moving from lying on your back to sitting on the side of a flat bed without using bedrails?: A Lot Help needed moving to and from a bed to a chair (including a wheelchair)?: A Lot Help needed standing up from a chair using your arms (e.g., wheelchair or bedside chair)?: A Lot Help needed to walk in hospital room?: A Lot Help needed climbing 3-5 steps with a railing? : A Lot 6 Click Score: 12    End of Session Equipment Utilized During Treatment: Gait belt;Oxygen Activity Tolerance: Patient limited by fatigue Patient left: with bed alarm set;with call bell/phone within reach;with nursing/sitter in room;with family/visitor present Nurse Communication: Mobility status PT Visit Diagnosis: Unsteadiness on feet (R26.81);History of falling (Z91.81);Muscle weakness (generalized) (M62.81);Difficulty in walking, not elsewhere classified (R26.2)     Time: 1751-0258 PT Time Calculation (min) (ACUTE ONLY): 48 min  Charges:  $Gait Training: 8-22 mins $Therapeutic Exercise: 8-22 mins $Therapeutic Activity: 8-22 mins                     Kreg Shropshire, DPT 06/10/2020, 12:15 PM

## 2020-06-10 NOTE — Progress Notes (Signed)
Progress Note  Patient Name: [name] Date of Encounter: 06/10/2020  [facility] Cardiologist: New- Dr. Saunders Revel  Subjective   Feels well, denies palpitations, denies chest pain, shortness of breath, edema.  Currently obtaining echocardiogram.  Converted to sinus rhythm from atrial fibrillation at about 5:30 AM.  TEE was canceled  Inpatient Medications    Scheduled Meds: . amiodarone  400 mg Oral BID  . ascorbic acid  500 mg Oral Daily  . atorvastatin  10 mg Oral Daily  . calcium-vitamin D  1 tablet Oral Daily  . Chlorhexidine Gluconate Cloth  6 each Topical Daily  . diclofenac Sodium  2 g Topical QID  . docusate sodium  100 mg Oral BID  . famotidine  20 mg Oral BID  . feeding supplement  237 mL Oral BID BM  . fluticasone  1 spray Each Nare Daily  . gabapentin  100 mg Oral Daily  . loratadine  10 mg Oral Daily  . methotrexate  10 mg Oral Weekly  . metoprolol tartrate  12.5 mg Oral Q6H  . multivitamin with minerals  1 tablet Oral Daily  . omega-3 acid ethyl esters  1 g Oral Daily  . polyethylene glycol  17 g Oral Daily  . senna-docusate  1 tablet Oral BID  . tamsulosin  0.4 mg Oral Daily  . vitamin B-12  1,000 mcg Oral Daily   Continuous Infusions: . sodium chloride 20 mL/hr at 06/10/20 1010  . heparin 1,000 Units/hr (06/10/20 1305)  . methocarbamol (ROBAXIN) IV     PRN Meds: acetaminophen, alum & mag hydroxide-simeth, bisacodyl, folic acid, HYDROcodone-acetaminophen, menthol-cetylpyridinium **OR** phenol, methocarbamol **OR** methocarbamol (ROBAXIN) IV, metoCLOPramide **OR** metoCLOPramide (REGLAN) injection, morphine injection, ondansetron (ZOFRAN) IV   Vital Signs    Vitals:   06/10/20 0411 06/10/20 0553 06/10/20 0721 06/10/20 1151  BP: 105/71  (!) 118/58 (!) 142/63  Pulse: (!) 144 88 91 93  Resp: 19 13 17 19   Temp: 97.9 F (36.6 C)  98.1 F (36.7 C) 98.4 F (36.9 C)  TempSrc: Oral  Oral Oral  SpO2: 96%  95% 96%  Weight:      Height:         Intake/Output Summary (Last 24 hours) at 06/10/2020 1435 Last data filed at 06/10/2020 1152 Gross per 24 hour  Intake 1169 ml  Output 1750 ml  Net -581 ml   Last 3 Weights 06/05/2020 02/10/2020 01/09/2020  Weight (lbs) 132 lb 4.4 oz 126 lb 12.8 oz 127 lb  Weight (kg) 60 kg 57.516 kg 57.607 kg      Telemetry    Sinus rhythm, heart rate 93- Personally Reviewed  ECG    No new tracing- Personally Reviewed  Physical Exam   GEN: No acute distress.   Neck: No JVD Cardiac: RRR, no murmurs, rubs, or gallops.  Respiratory: Clear to auscultation bilaterally. GI: Soft, nontender, non-distended  MS: No edema; No deformity. Neuro:  Nonfocal  Psych: Normal affect   Labs    High Sensitivity Troponin:   Recent Labs  Lab 06/06/20 1419 06/06/20 1650  TROPONINIHS 9 11      Chemistry Recent Labs  Lab 06/08/20 0632 06/09/20 0603 06/10/20 0146  NA 136 137 137  K 3.5 3.8 4.6  CL 100 99 101  CO2 28 29 29   GLUCOSE 115* 110* 117*  BUN 15 23 25*  CREATININE 0.57 0.55 0.63  CALCIUM 8.3* 8.7* 8.8*  GFRNONAA >60 >60 >60  ANIONGAP 8 9 7  Hematology Recent Labs  Lab 06/08/20 1610 06/09/20 0603 06/10/20 0146  WBC 14.4* 12.5* 10.5  RBC 3.76* 3.64* 3.64*  HGB 11.4* 11.2* 11.0*  HCT 34.0* 32.8* 32.7*  MCV 90.4 90.1 89.8  MCH 30.3 30.8 30.2  MCHC 33.5 34.1 33.6  RDW 13.5 13.3 13.5  PLT 152 178 195    BNP Recent Labs  Lab 06/09/20 0603  BNP 185.9*     DDimer  Recent Labs  Lab 06/08/20 1436  DDIMER 1.71*     Radiology    CT ANGIO CHEST PE W OR WO CONTRAST  Result Date: 06/08/2020 CLINICAL DATA:  87 year old female with positive D-dimer. Concern for pulmonary embolism. EXAM: CT ANGIOGRAPHY CHEST WITH CONTRAST TECHNIQUE: Multidetector CT imaging of the chest was performed using the standard protocol during bolus administration of intravenous contrast. Multiplanar CT image reconstructions and MIPs were obtained to evaluate the vascular anatomy. CONTRAST:  81mL  OMNIPAQUE IOHEXOL 350 MG/ML SOLN COMPARISON:  Chest CT dated 02/07/2017 and radiograph dated 06/07/2020. FINDINGS: Cardiovascular: No cardiomegaly or pericardial effusion. Coronary vascular calcification of the LAD. Moderate atherosclerotic calcification of the thoracic aorta. No aneurysmal dilatation or dissection. The origins of the great vessels of the aortic arch appear patent as visualized. No pulmonary artery embolus identified. Mediastinum/Nodes: Bilateral hilar and mediastinal adenopathy measure up to 15 mm in short axis in the subcarinal region. The esophagus is grossly unremarkable. No mediastinal fluid collection. Lungs/Pleura: Bilateral streaky densities with scattered pulmonary interstitial nodularity as seen on the prior CT suggestive of chronic atypical infection/inflammation. There are small bilateral pleural effusions with subsegmental atelectasis of the lower lobes. No pneumothorax. The central airways are patent. Upper Abdomen: No acute abnormality. Musculoskeletal: No chest wall abnormality. No acute or significant osseous findings. Review of the MIP images confirms the above findings. IMPRESSION: 1. No CT evidence of pulmonary embolism. 2. Bilateral streaky densities with scattered pulmonary interstitial nodularity as seen on the prior CT suggestive of chronic atypical infection/inflammation. 3. Small bilateral pleural effusions with subsegmental atelectasis of the lower lobes. 4. Bilateral hilar and mediastinal adenopathy, likely reactive. 5. Aortic Atherosclerosis (ICD10-I70.0). Electronically Signed   By: Anner Crete M.D.   On: 06/08/2020 19:25   DG Chest Port 1 View  Result Date: 06/09/2020 CLINICAL DATA:  Paroxysmal AFib EXAM: PORTABLE CHEST 1 VIEW COMPARISON:  06/07/2020 FINDINGS: Heart is normal size. Aortic atherosclerosis. Diffuse interstitial prominence, stable since prior study, likely fibrosis. No acute confluent opacities or effusions. IMPRESSION: Stable chronic changes.  No  active disease. Electronically Signed   By: Rolm Baptise M.D.   On: 06/09/2020 17:36    Cardiac Studies   Transthoracic echo pending  Patient Profile     87 y.o. female with history of hypertension, breast cancer, COPD presenting after a fall resulting in left femur fracture.  Underwent surgical procedure.  Being seen for new onset atrial fibrillation with rapid ventricular response.  Assessment & Plan    1.  A. fib with RVR -Converted to sinus rhythm about 5:30 AM today. -Fall/fracture likely instigated A. Fib. -Start amiodarone 400 mg twice daily x 1 week, then decrease to 200mg  bid to maintain sinus rhythm. -Continue heparin drip for now, convert to NOAC upon discharge -Echocardiogram reviewed with preserved ejection fraction  2.  Fall, femur fracture -Management as per surgical team  Total encounter time 35 minutes  Greater than 50% was spent in counseling and coordination of care with the patient       Signed, Kate Sable, MD  06/10/2020,  2:35 PM

## 2020-06-10 NOTE — Anesthesia Postprocedure Evaluation (Signed)
Anesthesia Post Note  Patient: [name]  Procedure(s) Performed: CANNULATED HIP PINNING (Left Hip)  Patient location during evaluation: PACU Anesthesia Type: General Level of consciousness: awake and alert Pain management: pain level controlled Vital Signs Assessment: post-procedure vital signs reviewed and stable Respiratory status: spontaneous breathing, nonlabored ventilation, respiratory function stable and patient connected to nasal cannula oxygen Cardiovascular status: blood pressure returned to baseline and stable Postop Assessment: no apparent nausea or vomiting Anesthetic complications: no   No complications documented.   Last Vitals:  Vitals:   06/10/20 1151 06/10/20 1601  BP: (!) 142/63 (!) 123/54  Pulse: 93 89  Resp: 19 20  Temp: 36.9 C 36.7 C  SpO2: 96% 95%    Last Pain:  Vitals:   06/10/20 1601  TempSrc: Oral  PainSc:                  [name]

## 2020-06-10 NOTE — Progress Notes (Signed)
   06/10/20 0900  Clinical Encounter Type  Visited With Patient and family together  Visit Type Spiritual support;Social support;Initial  Referral From Chaplain  Consult/Referral To [place] spoke to [name] and her husband while doing rounds. Chaplain provided spiritual and emotional support.

## 2020-06-10 NOTE — Evaluation (Signed)
Occupational Therapy Re-evaluation Patient Details Name: [name] MRN: [id number] DOB: Feb 23, 1934 Today's Date: 06/10/2020    History of Present Illness [name] is a 87 y.o. female with medical history significant for hypertension, GERD, COPD, dyslipidemia, history of left breast cancer status post chemo and radiation therapy who presents to the emergency room 06/05/2020 via EMS for evaluation of pain in her left hip following a fall, imaging revealed fracture. Patient reports chronic right hip pain andtypically uses a cane (intermittently with a walker) for the last couple of months. Underwent cannulated hip pinning 06/05/2020.  Also complains of limiting L shoulder pain since fall (x-ray negative for fx).  Pt had rapid response called 06/09/20 with HR in the 160-170 range.  Cardioversion on 4/6 was cancelled as she returned to sinus rhythm.   Clinical Impression   Pt seen for OT re-evaluation on this date following RVR and transfer to tele floor yesterday. Cardioversion cancelled and OT re-ordered. Upon arrival to room, pt awake and seated upright in recliner with spouse present. Pt agreeable to OT session. Pt denies changes in vision, sensation, and UE ROM since admission. This date, pt required MIN-MOD A for sit>stand transfers, MIN A for ambulating with RW to/from sink (~20 feet), and MIN GUARD for standing grooming tasks. Pt able to tolerate standing sink-side >5 mins, with no LOB observed. Of note, HR 90s throughout session and SpO2 briefly down 88-89% during standing grooming (able to improve to >92% within 30 sec of PLB). Pt left in recliner with HR 90s, SpO2 94%, and pt in no acute distress. Pt continues to benefit from skilled OT services to maximize return to PLOF and minimize risk of future falls, injury, caregiver burden, and readmission. Goals and discharge recommendation remains appropriate.     Follow Up Recommendations  SNF    Equipment Recommendations  Other (comment) (defer)         Precautions / Restrictions Precautions Precautions: Fall Restrictions Weight Bearing Restrictions: Yes LLE Weight Bearing: Weight bearing as tolerated Other Position/Activity Restrictions: L shoulder pain      Mobility Bed Mobility               General bed mobility comments: Pt in recliner and beginning and end of session    Transfers Overall transfer level: Needs assistance Equipment used: Rolling walker (2 wheeled) Transfers: Sit to/from Stand Sit to Stand: Mod assist;Min assist         General transfer comment: Pt had difficulty initiating upward movement sit>stand, requiring MOD A from recliner and MIN A from EOB (when elevated)    Balance Overall balance assessment: Needs assistance Sitting-balance support: No upper extremity supported;Feet supported Sitting balance-Leahy Scale: Good Sitting balance - Comments: Good sitting balance at EOB, with no LOB observed and pt able to weight shift   Standing balance support: No upper extremity supported;During functional activity Standing balance-Leahy Scale: Fair Standing balance comment: Able to perform bilateral standing grooming task w/ CGA                           ADL either performed or assessed with clinical judgement   ADL                                         General ADL Comments: SET-UP/SUPERVISION for standing grooming     Vision Patient Visual Report:  No change from baseline              Pertinent Vitals/Pain Pain Assessment: Faces Faces Pain Scale: Hurts little more Pain Location: L anterior shoulder and buttocks Pain Descriptors / Indicators: Tender;Sore Pain Intervention(s): Limited activity within patient's tolerance;Monitored during session;Repositioned        Extremity/Trunk Assessment Upper Extremity Assessment Upper Extremity Assessment: Generalized weakness;LUE deficits/detail LUE Deficits / Details: only ~1/3 shld ROM LUE: Unable to fully  assess due to pain   Lower Extremity Assessment Lower Extremity Assessment: Defer to PT evaluation;LLE deficits/detail LLE Deficits / Details: limited hip flexion AROM due to pain/surgery.   Cervical / Trunk Assessment Cervical / Trunk Assessment: Normal   Communication     Cognition Arousal/Alertness: Awake/alert Behavior During Therapy: WFL for tasks assessed/performed Overall Cognitive Status: Within Functional Limits for tasks assessed                                     General Comments  SpO2 briefly down 88-89% during standing grooming. HR 90s throughout.              OT Problem List: Decreased strength;Impaired balance (sitting and/or standing);Decreased range of motion;Decreased activity tolerance;Decreased knowledge of use of DME or AE;Pain;Impaired UE functional use      OT Treatment/Interventions: Self-care/ADL training;DME and/or AE instruction;Therapeutic activities;Balance training;Therapeutic exercise;Patient/family education    OT Goals(Current goals can be found in the care plan section) Acute Rehab OT Goals Patient Stated Goal: to get better OT Goal Formulation: With patient Time For Goal Achievement: 06/24/20 Potential to Achieve Goals: Good  OT Frequency: Min 2X/week    AM-PAC OT "6 Clicks" Daily Activity     Outcome Measure Help from another person eating meals?: None Help from another person taking care of personal grooming?: A Little Help from another person toileting, which includes using toliet, bedpan, or urinal?: A Lot Help from another person bathing (including washing, rinsing, drying)?: A Lot Help from another person to put on and taking off regular upper body clothing?: A Little Help from another person to put on and taking off regular lower body clothing?: A Lot 6 Click Score: 16   End of Session Equipment Utilized During Treatment: Gait belt;Rolling walker;Oxygen Nurse Communication: Mobility status  Activity  Tolerance: Patient tolerated treatment well Patient left: in chair;with call bell/phone within reach;with family/visitor present;with chair alarm set  OT Visit Diagnosis: Unsteadiness on feet (R26.81);Muscle weakness (generalized) (M62.81)                Time: 1440-1530 OT Time Calculation (min): 50 min Charges:  OT General Charges $OT Visit: 1 Visit OT Evaluation $OT Eval Moderate Complexity: 1 Mod OT Treatments $Therapeutic Activity: 23-37 mins  Fredirick Maudlin, OTR/L [place]

## 2020-06-10 NOTE — Progress Notes (Signed)
PT Cancellation Note  Patient Details Name: HALEIGH DESMITH MRN: [id number] DOB: Jun 07, 1933   Cancelled Treatment:    Reason Eval/Treat Not Completed: Medical issues which prohibited therapy Pt with continued HR issues overnight, scheduled for TEE/cardioversion mid day.  Per conversation with attending/cardio will hold PT until at least after that.  Will maintain on caseload for now and continue to follow.  Kreg Shropshire, DPT 06/10/2020, 8:13 AM

## 2020-06-10 NOTE — Progress Notes (Signed)
PROGRESS NOTE    [name]  QTM:[id number] DOB: 19-Jul-1933 DOA: 06/05/2020 PCP: Glean Hess, MD    Brief Narrative:  87 y.o.femalewith medical history significant forhypertension, GERD,COPD,dyslipidemia, history of left breast cancer status post chemo and radiation therapywho presents to the emergency room via EMS for evaluation of pain in her left hipfollowing a fall. Patient reports chronic right hip pain and has ambulates with a cane andintermittently with a walkerfor the last couple of months. Due to the pain in her right hip patient states that she lost her balance and felllanding on her left side. She denies hitting her head or having any loss of consciousness. She was able to get up with assistancefrom her husband but was unable tobear weight on her left lower extremity due to pain.  Left hip x-rayshows acute left hip subcapital femoral neck fracture.. Patient underwent left hip pinning on 4/1 with orthopedics.  Tolerated procedure well.  Postoperatively developed new onset atrial fibrillation with rapid ventricular response.  Cardiology consulted.  Patient remains hemodynamically stable.  Heart rate is been difficult to control.  Initially plan was for TEE/cardioversion on 4/6 however early morning approximately 5:30 AM the patient converted spontaneously to normal sinus rhythm.  TEE cardioversion on hold for now.   Assessment & Plan:   Principal Problem:   Fracture of femoral neck, left, closed ([place]) Active Problems:   Essential (primary) hypertension   Gastro-esophageal reflux disease without esophagitis   Stage 2 moderate COPD by GOLD classification (HCC)   Atrial fibrillation with rapid ventricular response (HCC)  Fracture of left femoral neck, Status post mechanical fall Consult orthopedic surgery s/p left hip pinning done on 06/05/2020 - WBAT -Multimodal pain regimen -VT prophylaxis on hold considering heparin GTT - previously treated w/  bisphosphonate has been off for a few years per patient, will need close pcp f/u to consider re-starting -Bowel regimen  New-onset atrial fibrillation w/ RVR No reported history Hemodynamically stable Suspect physiologic stress from hip fracture as causative factor Patient converted to NSR on 4/6 at 5 AM Previously plan TEE/DCCV on hold for now Case discussed with cardiology Plan: Amiodarone 400 mg p.o. twice daily Continue heparin GTT for now We will need to transition to p.o. anticoagulant prior to discharge TTE ordered and pending Appreciate further cardiology assistance   Acute hypoxic respiratory failure Chronic COPD History of bronchiectasis No baseline oxygen requirement CT PE study negative Currently requiring 2 L Encourage regular I-S use Up with therapy Wean as tolerated  Marked leukocytosis  most likely reactive Most likely stress related from recent fracture urine analysis not very impressive WBC trending down Monitor CBC  Hypertension bp soft today with RVR - Continue metoprolol as above - hold hctz  Dyslipidemia Continue statins andfish oil  Hypophosphatemia - monitor   DVT prophylaxis: Heparin GTT Code Status: Full Family Communication: Spouse at bedside Disposition Plan: Status is: Inpatient  Remains inpatient appropriate because:Inpatient level of care appropriate due to severity of illness   Dispo: The patient is from: Home              Anticipated d/c is to: SNF              Patient currently is not medically stable to d/c.   Difficult to place patient No  Possible medical readiness for discharge in 24 to 48 hours.  Converted spontaneously to normal sinus rhythm.  Plans for TEE cardioversion on hold for now.  Remains on heparin GTT.  Will need stability  of heart rate and transition to oral anticoagulant prior to discharge.     Level of care: Progressive Cardiac  Consultants:   Cardiology  Orthopedics  Procedures:    Left hip pinning 4/1  Antimicrobials:   None   Subjective: Seen and examined.  When asked how she is doing she states "miserable".  Denies significant pain however, just getting frustrated about immobility and continued hospital stay.  Objective: Vitals:   06/09/20 2319 06/10/20 0411 06/10/20 0553 06/10/20 0721  BP: 123/77 105/71  (!) 118/58  Pulse: (!) 141 (!) 144 88 91  Resp: 18 19 13 17   Temp: 97.8 F (36.6 C) 97.9 F (36.6 C)  98.1 F (36.7 C)  TempSrc: Oral Oral  Oral  SpO2: 93% 96%  95%  Weight:      Height:        Intake/Output Summary (Last 24 hours) at 06/10/2020 1114 Last data filed at 06/10/2020 1010 Gross per 24 hour  Intake 809 ml  Output 950 ml  Net -141 ml   Filed Weights   06/05/20 0843  Weight: 60 kg    Examination:  General exam: Appears calm and comfortable  Respiratory system: Bibasilar crackles.  Normal work of breathing.  2 L. Cardiovascular system: S1-S2, regular rate and rhythm, no murmurs Gastrointestinal system: Abdomen is nondistended, soft and nontender. No organomegaly or masses felt. Normal bowel sounds heard. Central nervous system: Alert and oriented. No focal neurological deficits. Extremities: Symmetric 5 x 5 power.  Left hip surgical dressing CDI Skin: No rashes, lesions or ulcers Psychiatry: Judgement and insight appear normal. Mood & affect appropriate.     Data Reviewed: I have personally reviewed following labs and imaging studies  CBC: Recent Labs  Lab 06/06/20 0458 06/07/20 0700 06/08/20 0632 06/09/20 0603 06/10/20 0146  WBC 13.5* 16.4* 14.4* 12.5* 10.5  HGB 12.7 12.0 11.4* 11.2* 11.0*  HCT 37.6 35.1* 34.0* 32.8* 32.7*  MCV 91.3 91.9 90.4 90.1 89.8  PLT 185 159 152 178 811   Basic Metabolic Panel: Recent Labs  Lab 06/06/20 0458 06/07/20 0700 06/08/20 0632 06/09/20 0603 06/10/20 0146 06/10/20 0957  NA 137 140 136 137 137  --   K 3.8 3.6 3.5 3.8 4.6  --   CL 103 105 100 99 101  --   CO2 26 27 28 29  29   --   GLUCOSE 142* 117* 115* 110* 117*  --   BUN 25* 23 15 23  25*  --   CREATININE 0.67 0.59 0.57 0.55 0.63  --   CALCIUM 8.3* 8.3* 8.3* 8.7* 8.8*  --   MG  --  2.2 2.2  --   --  2.3  PHOS  --  1.5* 1.8*  --  3.2  --    GFR: Estimated Creatinine Clearance: 43.3 mL/min (by C-G formula based on SCr of 0.63 mg/dL). Liver Function Tests: No results for input(s): AST, ALT, ALKPHOS, BILITOT, PROT, ALBUMIN in the last 168 hours. No results for input(s): LIPASE, AMYLASE in the last 168 hours. No results for input(s): AMMONIA in the last 168 hours. Coagulation Profile: Recent Labs  Lab 06/09/20 1732  INR 1.0   Cardiac Enzymes: No results for input(s): CKTOTAL, CKMB, CKMBINDEX, TROPONINI in the last 168 hours. BNP (last 3 results) No results for input(s): PROBNP in the last 8760 hours. HbA1C: No results for input(s): HGBA1C in the last 72 hours. CBG: No results for input(s): GLUCAP in the last 168 hours. Lipid Profile: No results for input(s): CHOL,  HDL, LDLCALC, TRIG, CHOLHDL, LDLDIRECT in the last 72 hours. Thyroid Function Tests: Recent Labs    06/09/20 0603  TSH 3.538   Anemia Panel: No results for input(s): VITAMINB12, FOLATE, FERRITIN, TIBC, IRON, RETICCTPCT in the last 72 hours. Sepsis Labs: No results for input(s): PROCALCITON, LATICACIDVEN in the last 168 hours.  Recent Results (from the past 240 hour(s))  Resp Panel by RT-PCR (Flu A&B, Covid) Nasopharyngeal Swab     Status: None   Collection Time: 06/05/20  9:54 AM   Specimen: Nasopharyngeal Swab; Nasopharyngeal(NP) swabs in vial transport medium  Result Value Ref Range Status   SARS Coronavirus 2 by RT PCR NEGATIVE NEGATIVE Final    Comment: (NOTE) SARS-CoV-2 target nucleic acids are NOT DETECTED.  The SARS-CoV-2 RNA is generally detectable in upper respiratory specimens during the acute phase of infection. The lowest concentration of SARS-CoV-2 viral copies this assay can detect is 138 copies/mL. A negative  result does not preclude SARS-Cov-2 infection and should not be used as the sole basis for treatment or other patient management decisions. A negative result may occur with  improper specimen collection/handling, submission of specimen other than nasopharyngeal swab, presence of viral mutation(s) within the areas targeted by this assay, and inadequate number of viral copies(<138 copies/mL). A negative result must be combined with clinical observations, patient history, and epidemiological information. The expected result is Negative.  Fact Sheet for Patients:  EntrepreneurPulse.com.au  Fact Sheet for Healthcare Providers:  IncredibleEmployment.be  This test is no t yet approved or cleared by the [place] FDA and  has been authorized for detection and/or diagnosis of SARS-CoV-2 by FDA under an Emergency Use Authorization (EUA). This EUA will remain  in effect (meaning this test can be used) for the duration of the COVID-19 declaration under Section 564(b)(1) of the Act, 21 U.S.C.section 360bbb-3(b)(1), unless the authorization is terminated  or revoked sooner.       Influenza A by PCR NEGATIVE NEGATIVE Final   Influenza B by PCR NEGATIVE NEGATIVE Final    Comment: (NOTE) The Xpert Xpress SARS-CoV-2/FLU/RSV plus assay is intended as an aid in the diagnosis of influenza from Nasopharyngeal swab specimens and should not be used as a sole basis for treatment. Nasal washings and aspirates are unacceptable for Xpert Xpress SARS-CoV-2/FLU/RSV testing.  Fact Sheet for Patients: EntrepreneurPulse.com.au  Fact Sheet for Healthcare Providers: IncredibleEmployment.be  This test is not yet approved or cleared by the [place] FDA and has been authorized for detection and/or diagnosis of SARS-CoV-2 by FDA under an Emergency Use Authorization (EUA). This EUA will remain in effect (meaning this test can be used)  for the duration of the COVID-19 declaration under Section 564(b)(1) of the Act, 21 U.S.C. section 360bbb-3(b)(1), unless the authorization is terminated or revoked.  Performed at [facility], [address], [place], [place] [address]          Radiology Studies: CT ANGIO CHEST PE W OR WO CONTRAST  Result Date: 06/08/2020 CLINICAL DATA:  87 year old female with positive D-dimer. Concern for pulmonary embolism. EXAM: CT ANGIOGRAPHY CHEST WITH CONTRAST TECHNIQUE: Multidetector CT imaging of the chest was performed using the standard protocol during bolus administration of intravenous contrast. Multiplanar CT image reconstructions and MIPs were obtained to evaluate the vascular anatomy. CONTRAST:  80mL OMNIPAQUE IOHEXOL 350 MG/ML SOLN COMPARISON:  Chest CT dated 02/07/2017 and radiograph dated 06/07/2020. FINDINGS: Cardiovascular: No cardiomegaly or pericardial effusion. Coronary vascular calcification of the LAD. Moderate atherosclerotic calcification of the thoracic aorta. No  aneurysmal dilatation or dissection. The origins of the great vessels of the aortic arch appear patent as visualized. No pulmonary artery embolus identified. Mediastinum/Nodes: Bilateral hilar and mediastinal adenopathy measure up to 15 mm in short axis in the subcarinal region. The esophagus is grossly unremarkable. No mediastinal fluid collection. Lungs/Pleura: Bilateral streaky densities with scattered pulmonary interstitial nodularity as seen on the prior CT suggestive of chronic atypical infection/inflammation. There are small bilateral pleural effusions with subsegmental atelectasis of the lower lobes. No pneumothorax. The central airways are patent. Upper Abdomen: No acute abnormality. Musculoskeletal: No chest wall abnormality. No acute or significant osseous findings. Review of the MIP images confirms the above findings. IMPRESSION: 1. No CT evidence of pulmonary embolism. 2. Bilateral streaky densities with  scattered pulmonary interstitial nodularity as seen on the prior CT suggestive of chronic atypical infection/inflammation. 3. Small bilateral pleural effusions with subsegmental atelectasis of the lower lobes. 4. Bilateral hilar and mediastinal adenopathy, likely reactive. 5. Aortic Atherosclerosis (ICD10-I70.0). Electronically Signed   By: Anner Crete M.D.   On: 06/08/2020 19:25   DG Chest Port 1 View  Result Date: 06/09/2020 CLINICAL DATA:  Paroxysmal AFib EXAM: PORTABLE CHEST 1 VIEW COMPARISON:  06/07/2020 FINDINGS: Heart is normal size. Aortic atherosclerosis. Diffuse interstitial prominence, stable since prior study, likely fibrosis. No acute confluent opacities or effusions. IMPRESSION: Stable chronic changes.  No active disease. Electronically Signed   By: Rolm Baptise M.D.   On: 06/09/2020 17:36        Scheduled Meds: . amiodarone  400 mg Oral BID  . ascorbic acid  500 mg Oral Daily  . atorvastatin  10 mg Oral Daily  . calcium-vitamin D  1 tablet Oral Daily  . Chlorhexidine Gluconate Cloth  6 each Topical Daily  . diclofenac Sodium  2 g Topical QID  . docusate sodium  100 mg Oral BID  . famotidine  20 mg Oral BID  . feeding supplement  237 mL Oral BID BM  . fluticasone  1 spray Each Nare Daily  . gabapentin  100 mg Oral Daily  . loratadine  10 mg Oral Daily  . methotrexate  10 mg Oral Weekly  . metoprolol tartrate  12.5 mg Oral Q6H  . multivitamin with minerals  1 tablet Oral Daily  . omega-3 acid ethyl esters  1 g Oral Daily  . polyethylene glycol  17 g Oral Daily  . senna  1 tablet Oral QHS  . tamsulosin  0.4 mg Oral Daily  . vitamin B-12  1,000 mcg Oral Daily   Continuous Infusions: . sodium chloride 20 mL/hr at 06/10/20 1010  . heparin 850 Units/hr (06/10/20 1010)  . methocarbamol (ROBAXIN) IV       LOS: 5 days    Time spent: 25 minutes    Sidney Ace, MD [facility] Pager [phone]  If 7PM-7AM, please contact  night-coverage 06/10/2020, 11:14 AM

## 2020-06-10 NOTE — Progress Notes (Signed)
Nutrition Follow-up  DOCUMENTATION CODES:   Not applicable  INTERVENTION:   -Continue MVI with minerals daily -Continue Ensure Enlive po BID, each supplement provides 350 kcal and 20 grams of protein  NUTRITION DIAGNOSIS:   Increased nutrient needs related to hip fracture as evidenced by estimated needs.  Ongoing  GOAL:   Patient will meet greater than or equal to 90% of their needs  Progressing   MONITOR:   Diet advancement,I & O's  REASON FOR ASSESSMENT:   Consult Assessment of nutrition requirement/status  ASSESSMENT:   Pt with PMH of HTN, GERD, COPD, HLD, breast cancer s/p chemo/XRT admitted with L hip fx after fall.  4/1- s/p Procedure(s): CANNULATED HIP PINNING (Left)   Reviewed I/O's: -100 ml x 24 hours and +3.3 L since admission  UOP: 950 ml x 24 hours  Spoke with pt and husband in room. Per pt, she typically has a good appetite. PTA she was consuming 3 meals per day (Breakfast: county ham, eggs, and toast; Lunch: sandwich; Dinner: meat, starch, and vegetable). Per pt, lunch is usually the meal she eats the least.   Pt shares her appetite has declined since surgery. Noted meal completions 50-100%, however, pt husband admits to consuming some items off of her meal trays. She has been consuming Ensure supplements, which she likes. Discussed importance of good meal and supplement intake to promote healing.   Pt denies any weight loss. She reports UBW is around 140#.   Medications reviewed and include calcium with vitamin D, colace, senokot, vitamin B-12, and 0.9% sodium chlroide infusion @ 20 ml/hr.   Labs reviewed.   NUTRITION - FOCUSED PHYSICAL EXAM:  Flowsheet Row Most Recent Value  Orbital Region No depletion  Upper Arm Region Mild depletion  Thoracic and Lumbar Region No depletion  Buccal Region No depletion  Temple Region Mild depletion  Clavicle Bone Region No depletion  Clavicle and Acromion Bone Region No depletion  Scapular Bone Region No  depletion  Dorsal Hand Mild depletion  Patellar Region No depletion  Anterior Thigh Region No depletion  Posterior Calf Region No depletion  Edema (RD Assessment) Mild  Hair Reviewed  Eyes Reviewed  Mouth Reviewed  Skin Reviewed  Nails Reviewed       Diet Order:   Diet Order            Diet Heart Room service appropriate? Yes; Fluid consistency: Thin  Diet effective now                 EDUCATION NEEDS:   Education needs have been addressed  Skin:  Skin Assessment: Skin Integrity Issues: Skin Integrity Issues:: Incisions Incisions: closed lt hip  Last BM:  06/07/20  Height:   Ht Readings from Last 1 Encounters:  06/05/20 5\' 3"  (1.6 m)    Weight:   Wt Readings from Last 1 Encounters:  06/05/20 60 kg    Ideal Body Weight:  52.2 kg  BMI:  Body mass index is 23.43 kg/m.  Estimated Nutritional Needs:   Kcal:  1500-1700  Protein:  75-85 grams  Fluid:  >1.5 L/day    Loistine Chance, RD, LDN, CDCES Registered Dietitian II Certified Diabetes Care and Education Specialist Please refer to [facility] for RD and/or RD on-call/weekend/after hours pager

## 2020-06-10 NOTE — Progress Notes (Signed)
*  PRELIMINARY RESULTS* Echocardiogram 2D Echocardiogram has been performed.  Sherrie Sport 06/10/2020, 11:43 AM

## 2020-06-10 NOTE — Progress Notes (Signed)
ANTICOAGULATION CONSULT NOTE   Pharmacy Consult for Heparin Indication: atrial fibrillation  Patient Measurements: Heparin Dosing Weight: 60 kg   Labs: Recent Labs    06/08/20 0632 06/09/20 0603 06/09/20 1732 06/10/20 0146 06/10/20 0957 06/10/20 2053  HGB 11.4* 11.2*  --  11.0*  --   --   HCT 34.0* 32.8*  --  32.7*  --   --   PLT 152 178  --  195  --   --   APTT  --   --  34  --   --   --   LABPROT  --   --  13.1  --   --   --   INR  --   --  1.0  --   --   --   HEPARINUNFRC  --   --   --  0.33 0.27* 0.57  CREATININE 0.57 0.55  --  0.63  --   --     Estimated Creatinine Clearance: 43.3 mL/min (by C-G formula based on SCr of 0.63 mg/dL).   Medical History: Past Medical History:  Diagnosis Date  . Allergy   . Bronchiectasis ([place])   . Cancer [facility])    left breast cancer   . GERD (gastroesophageal reflux disease)   . Hyperlipemia   . Hypertension   . Intractable nausea and vomiting 06/20/2018    Assessment: [name] is an 87 y/o female who presented for left hip pain. She has a hx significant for HTN, left breast cancer s/p chemo and radiation therapy. She has a new onset of atrial fibrillation with RVR and was seen by cardiology today. She was previously on enoxaparin for DVT ppx started on 4/2 with her last dose given 4/5 at 0845. She will be transitioned to heparin. Pharmacy has been consulted for heparin dosing. CHADSVASC: 4.   4/6 Labs: Hgb slight downward trend 11.2>11.0. Platelets slight upward trend 178>195.   Goal of Therapy:  Heparin level 0.3-0.7  4/6 0146 HL 0.33, therapeutic x 1 4/6 0957 HL 0.27 subtherapeutic 4/6 2053 HL 0.57; therapeutic x 1, 1000 units/hr   Plan:  --Heparin level is therapeutic x 1. Continue heparin infusion at current rate of 1000 units/hr --Recheck confirmatory HL in 8 hours --Daily CBC per protocol while on IV heparin  Benita Gutter 06/10/2020 9:55 PM

## 2020-06-11 ENCOUNTER — Telehealth: Payer: Self-pay | Admitting: Internal Medicine

## 2020-06-11 ENCOUNTER — Ambulatory Visit: Payer: Medicare PPO | Admitting: Physical Therapy

## 2020-06-11 ENCOUNTER — Inpatient Hospital Stay: Payer: Medicare PPO

## 2020-06-11 DIAGNOSIS — K5900 Constipation, unspecified: Secondary | ICD-10-CM | POA: Diagnosis present

## 2020-06-11 DIAGNOSIS — W19XXXA Unspecified fall, initial encounter: Secondary | ICD-10-CM

## 2020-06-11 DIAGNOSIS — Z743 Need for continuous supervision: Secondary | ICD-10-CM | POA: Diagnosis not present

## 2020-06-11 DIAGNOSIS — S72002S Fracture of unspecified part of neck of left femur, sequela: Secondary | ICD-10-CM

## 2020-06-11 DIAGNOSIS — K219 Gastro-esophageal reflux disease without esophagitis: Secondary | ICD-10-CM | POA: Diagnosis present

## 2020-06-11 DIAGNOSIS — R279 Unspecified lack of coordination: Secondary | ICD-10-CM | POA: Diagnosis not present

## 2020-06-11 DIAGNOSIS — D72829 Elevated white blood cell count, unspecified: Secondary | ICD-10-CM | POA: Diagnosis not present

## 2020-06-11 DIAGNOSIS — I480 Paroxysmal atrial fibrillation: Secondary | ICD-10-CM | POA: Diagnosis present

## 2020-06-11 DIAGNOSIS — J948 Other specified pleural conditions: Secondary | ICD-10-CM | POA: Diagnosis not present

## 2020-06-11 DIAGNOSIS — I10 Essential (primary) hypertension: Secondary | ICD-10-CM | POA: Diagnosis present

## 2020-06-11 DIAGNOSIS — R0602 Shortness of breath: Secondary | ICD-10-CM

## 2020-06-11 DIAGNOSIS — J984 Other disorders of lung: Secondary | ICD-10-CM | POA: Diagnosis not present

## 2020-06-11 DIAGNOSIS — I272 Pulmonary hypertension, unspecified: Secondary | ICD-10-CM

## 2020-06-11 DIAGNOSIS — Z87440 Personal history of urinary (tract) infections: Secondary | ICD-10-CM | POA: Diagnosis not present

## 2020-06-11 DIAGNOSIS — E039 Hypothyroidism, unspecified: Secondary | ICD-10-CM | POA: Diagnosis not present

## 2020-06-11 DIAGNOSIS — J9601 Acute respiratory failure with hypoxia: Secondary | ICD-10-CM

## 2020-06-11 DIAGNOSIS — Z79899 Other long term (current) drug therapy: Secondary | ICD-10-CM | POA: Diagnosis not present

## 2020-06-11 DIAGNOSIS — Z803 Family history of malignant neoplasm of breast: Secondary | ICD-10-CM | POA: Diagnosis not present

## 2020-06-11 DIAGNOSIS — E785 Hyperlipidemia, unspecified: Secondary | ICD-10-CM | POA: Diagnosis present

## 2020-06-11 DIAGNOSIS — Z66 Do not resuscitate: Secondary | ICD-10-CM | POA: Diagnosis present

## 2020-06-11 DIAGNOSIS — E860 Dehydration: Secondary | ICD-10-CM | POA: Diagnosis present

## 2020-06-11 DIAGNOSIS — Z7901 Long term (current) use of anticoagulants: Secondary | ICD-10-CM | POA: Diagnosis not present

## 2020-06-11 DIAGNOSIS — M25512 Pain in left shoulder: Secondary | ICD-10-CM | POA: Diagnosis not present

## 2020-06-11 DIAGNOSIS — J441 Chronic obstructive pulmonary disease with (acute) exacerbation: Secondary | ICD-10-CM | POA: Diagnosis not present

## 2020-06-11 DIAGNOSIS — R609 Edema, unspecified: Secondary | ICD-10-CM | POA: Diagnosis present

## 2020-06-11 DIAGNOSIS — S72002D Fracture of unspecified part of neck of left femur, subsequent encounter for closed fracture with routine healing: Secondary | ICD-10-CM | POA: Diagnosis not present

## 2020-06-11 DIAGNOSIS — Z9049 Acquired absence of other specified parts of digestive tract: Secondary | ICD-10-CM | POA: Diagnosis not present

## 2020-06-11 DIAGNOSIS — Z90710 Acquired absence of both cervix and uterus: Secondary | ICD-10-CM | POA: Diagnosis not present

## 2020-06-11 DIAGNOSIS — R110 Nausea: Secondary | ICD-10-CM | POA: Diagnosis present

## 2020-06-11 DIAGNOSIS — J449 Chronic obstructive pulmonary disease, unspecified: Secondary | ICD-10-CM | POA: Diagnosis present

## 2020-06-11 DIAGNOSIS — I4891 Unspecified atrial fibrillation: Secondary | ICD-10-CM | POA: Diagnosis not present

## 2020-06-11 DIAGNOSIS — G9341 Metabolic encephalopathy: Secondary | ICD-10-CM | POA: Diagnosis present

## 2020-06-11 DIAGNOSIS — Z853 Personal history of malignant neoplasm of breast: Secondary | ICD-10-CM | POA: Diagnosis not present

## 2020-06-11 DIAGNOSIS — T83511A Infection and inflammatory reaction due to indwelling urethral catheter, initial encounter: Secondary | ICD-10-CM | POA: Diagnosis present

## 2020-06-11 DIAGNOSIS — S72012A Unspecified intracapsular fracture of left femur, initial encounter for closed fracture: Secondary | ICD-10-CM | POA: Diagnosis present

## 2020-06-11 DIAGNOSIS — I959 Hypotension, unspecified: Secondary | ICD-10-CM | POA: Diagnosis not present

## 2020-06-11 DIAGNOSIS — Z978 Presence of other specified devices: Secondary | ICD-10-CM | POA: Diagnosis not present

## 2020-06-11 DIAGNOSIS — R531 Weakness: Secondary | ICD-10-CM | POA: Diagnosis not present

## 2020-06-11 DIAGNOSIS — S42022A Displaced fracture of shaft of left clavicle, initial encounter for closed fracture: Secondary | ICD-10-CM | POA: Diagnosis not present

## 2020-06-11 DIAGNOSIS — Z20822 Contact with and (suspected) exposure to covid-19: Secondary | ICD-10-CM | POA: Diagnosis present

## 2020-06-11 DIAGNOSIS — N390 Urinary tract infection, site not specified: Secondary | ICD-10-CM | POA: Diagnosis present

## 2020-06-11 DIAGNOSIS — S72002A Fracture of unspecified part of neck of left femur, initial encounter for closed fracture: Secondary | ICD-10-CM | POA: Diagnosis not present

## 2020-06-11 DIAGNOSIS — Z85828 Personal history of other malignant neoplasm of skin: Secondary | ICD-10-CM | POA: Diagnosis not present

## 2020-06-11 DIAGNOSIS — S42002A Fracture of unspecified part of left clavicle, initial encounter for closed fracture: Secondary | ICD-10-CM | POA: Diagnosis not present

## 2020-06-11 LAB — BASIC METABOLIC PANEL
Anion gap: 7 (ref 5–15)
BUN: 32 mg/dL — ABNORMAL HIGH (ref 8–23)
CO2: 29 mmol/L (ref 22–32)
Calcium: 8.7 mg/dL — ABNORMAL LOW (ref 8.9–10.3)
Chloride: 103 mmol/L (ref 98–111)
Creatinine, Ser: 0.62 mg/dL (ref 0.44–1.00)
GFR, Estimated: >60 mL/min (ref >60)
Glucose, Bld: 103 mg/dL — ABNORMAL HIGH (ref 70–99)
Potassium: 4.2 mmol/L (ref 3.5–5.1)
Sodium: 139 mmol/L (ref 135–145)

## 2020-06-11 LAB — CBC
HCT: 32.9 % — ABNORMAL LOW (ref 36.0–46.0)
Hemoglobin: 11 g/dL — ABNORMAL LOW (ref 12.0–15.0)
MCH: 30.4 pg (ref 26.0–34.0)
MCHC: 33.4 g/dL (ref 30.0–36.0)
MCV: 90.9 fL (ref 80.0–100.0)
Platelets: 212 10*3/uL (ref 150–400)
RBC: 3.62 MIL/uL — ABNORMAL LOW (ref 3.87–5.11)
RDW: 13.5 % (ref 11.5–15.5)
WBC: 11 10*3/uL — ABNORMAL HIGH (ref 4.0–10.5)
nRBC: 0 % (ref 0.0–0.2)

## 2020-06-11 LAB — PHOSPHORUS: Phosphorus: 3.5 mg/dL (ref 2.5–4.6)

## 2020-06-11 LAB — RESP PANEL BY RT-PCR (FLU A&B, COVID) ARPGX2
Influenza A by PCR: NEGATIVE
Influenza B by PCR: NEGATIVE
SARS Coronavirus 2 by RT PCR: NEGATIVE

## 2020-06-11 LAB — HEPARIN LEVEL (UNFRACTIONATED): Heparin Unfractionated: 0.41 IU/mL (ref 0.30–0.70)

## 2020-06-11 LAB — MAGNESIUM: Magnesium: 2.2 mg/dL (ref 1.7–2.4)

## 2020-06-11 MED ORDER — METOPROLOL TARTRATE 25 MG PO TABS: 12.5000 mg | ORAL_TABLET | Freq: Two times a day (BID) | ORAL | Status: DC

## 2020-06-11 MED ORDER — AMIODARONE HCL 200 MG PO TABS: 200.0000 mg | ORAL_TABLET | Freq: Two times a day (BID) | ORAL | Status: DC

## 2020-06-11 MED ORDER — FUROSEMIDE 10 MG/ML IJ SOLN
20.0000 mg | Freq: Once | INTRAMUSCULAR | Status: AC
  Administered 2020-06-11: 20 mg via INTRAVENOUS
  Filled 2020-06-11: qty 2

## 2020-06-11 MED ORDER — MULTIVITAMIN ADULT PO TABS: 1.0000 | ORAL_TABLET | Freq: Every day | ORAL | Status: DC

## 2020-06-11 MED ORDER — AMIODARONE HCL 200 MG PO TABS: 400.0000 mg | ORAL_TABLET | Freq: Two times a day (BID) | ORAL | Status: DC

## 2020-06-11 MED ORDER — APIXABAN 5 MG PO TABS
5.0000 mg | ORAL_TABLET | Freq: Two times a day (BID) | ORAL | Status: DC
  Administered 2020-06-11: 5 mg via ORAL
  Filled 2020-06-11: qty 1

## 2020-06-11 MED ORDER — FUROSEMIDE 20 MG PO TABS: 20.0000 mg | ORAL_TABLET | Freq: Every day | ORAL | 11 refills | Status: DC

## 2020-06-11 MED ORDER — APIXABAN 5 MG PO TABS: 5.0000 mg | ORAL_TABLET | Freq: Two times a day (BID) | ORAL | Status: DC

## 2020-06-11 MED ORDER — VITAMIN B-12 1000 MCG PO TABS: 1000.0000 ug | ORAL_TABLET | Freq: Every day | ORAL | Status: DC

## 2020-06-11 MED ORDER — DOCUSATE SODIUM 100 MG PO CAPS
100.0000 mg | ORAL_CAPSULE | Freq: Two times a day (BID) | ORAL | 0 refills | Status: DC
Start: 2020-06-11 — End: 2020-06-24

## 2020-06-11 MED ORDER — FISH OIL 1000 MG PO CAPS: 1.0000 | ORAL_CAPSULE | Freq: Every day | ORAL | Status: DC

## 2020-06-11 MED ORDER — ASCORBIC ACID 500 MG PO TABS: 500.0000 mg | ORAL_TABLET | Freq: Every day | ORAL | Status: DC

## 2020-06-11 MED ORDER — CALCIUM CARBONATE-VITAMIN D 500-200 MG-UNIT PO TABS: 1.0000 | ORAL_TABLET | Freq: Every day | ORAL | Status: DC

## 2020-06-11 MED ORDER — FAMOTIDINE 20 MG PO TABS: 20.0000 mg | ORAL_TABLET | Freq: Two times a day (BID) | ORAL | Status: DC

## 2020-06-11 MED ORDER — DICLOFENAC SODIUM 1 % EX GEL: 2.0000 g | Freq: Four times a day (QID) | CUTANEOUS | Status: DC

## 2020-06-11 MED ORDER — SENNOSIDES-DOCUSATE SODIUM 8.6-50 MG PO TABS: 1.0000 | ORAL_TABLET | Freq: Two times a day (BID) | ORAL | Status: DC

## 2020-06-11 MED ORDER — CETIRIZINE HCL 10 MG PO TABS: 10.0000 mg | ORAL_TABLET | Freq: Every day | ORAL | Status: DC

## 2020-06-11 MED ORDER — ATORVASTATIN CALCIUM 20 MG PO TABS: 20.0000 mg | ORAL_TABLET | Freq: Every day | ORAL | Status: DC

## 2020-06-11 MED ORDER — AMIODARONE HCL 400 MG PO TABS: 400.0000 mg | ORAL_TABLET | Freq: Two times a day (BID) | ORAL | Status: DC

## 2020-06-11 NOTE — Telephone Encounter (Signed)
LMOV to schedule  

## 2020-06-11 NOTE — Progress Notes (Signed)
Physical Therapy Treatment Patient Details Name: [name] MRN: [id number] DOB: 07/23/1933 Today's Date: 06/11/2020    History of Present Illness [name] is a 87 y.o. female with medical history significant for hypertension, GERD, COPD, dyslipidemia, history of left breast cancer status post chemo and radiation therapy who presents to the emergency room 06/05/2020 via EMS for evaluation of pain in her left hip following a fall, imaging revealed fracture. Patient reports chronic right hip pain andtypically uses a cane (intermittently with a walker) for the last couple of months. Underwent cannulated hip pinning 06/05/2020.  Also complains of limiting L shoulder pain since fall (x-ray negative for fx).  Pt had rapid response called 06/09/20 with HR in the 160-170 range.  Cardioversion on 4/6 was cancelled as she returned to sinus rhythm.    PT Comments    Pt's O2 in the mid 90s on 2L on arrival, entire session on room air with sats generally hovering in the 89-91 range, able to stop and take some purposeful breaths when sats would occasionally dip below 88%.  Pt showed good effort but did seem to have less energy today and only managed to walk ~20 ft with heavy effort.      Follow Up Recommendations  SNF     Equipment Recommendations  Rolling walker with 5" wheels;3in1 (PT)    Recommendations for Other Services       Precautions / Restrictions Precautions Precautions: Fall Restrictions LLE Weight Bearing: Weight bearing as tolerated    Mobility  Bed Mobility Overal bed mobility: Needs Assistance Bed Mobility: Supine to Sit     Supine to sit: Mod assist          Transfers Overall transfer level: Needs assistance Equipment used: Rolling walker (2 wheeled) Transfers: Sit to/from Stand Sit to Stand: Min assist            Ambulation/Gait Ambulation/Gait assistance: Min Web designer (Feet): 20 Feet Assistive device: Rolling walker (2 wheeled)        General Gait Details: Pt again with slow and hesitant gait, she did not seem to have a lot of energy today and was already slowing and speaking of being tired before reaching the door.  Pt was able to maintain O2 in the 88-92% range on room air t/o the effort.   Stairs             Wheelchair Mobility    Modified Rankin (Stroke Patients Only)       Balance Overall balance assessment: Needs assistance Sitting-balance support: No upper extremity supported;Feet supported Sitting balance-Leahy Scale: Good Sitting balance - Comments: Good sitting balance at EOB, with no LOB observed and pt able to weight shift minimally   Standing balance support: Bilateral upper extremity supported Standing balance-Leahy Scale: Fair Standing balance comment: reliant on walker, unable to put a lot of weight through L shoulder comfortably                            Cognition Arousal/Alertness: Awake/alert Behavior During Therapy: WFL for tasks assessed/performed Overall Cognitive Status: Within Functional Limits for tasks assessed                                        Exercises General Exercises - Lower Extremity Short Arc Quad: 10 reps;Strengthening Long Arc Quad: Strengthening;10 reps Heel Slides: Strengthening;10  reps (with resisted leg ext) Hip ABduction/ADduction: Strengthening;10 reps Straight Leg Raises: AAROM;10 reps (AROM (with great effort) on final rep) Hip Flexion/Marching: AROM;Strengthening;10 reps    General Comments        Pertinent Vitals/Pain Pain Score: 5  Pain Location: L anterior shoulder    Home Living                      Prior Function            PT Goals (current goals can now be found in the care plan section) Progress towards PT goals: Progressing toward goals    Frequency    BID      PT Plan Current plan remains appropriate    Co-evaluation              AM-PAC PT "6 Clicks" Mobility   Outcome  Measure  Help needed turning from your back to your side while in a flat bed without using bedrails?: A Lot Help needed moving from lying on your back to sitting on the side of a flat bed without using bedrails?: A Lot Help needed moving to and from a bed to a chair (including a wheelchair)?: A Lot Help needed standing up from a chair using your arms (e.g., wheelchair or bedside chair)?: A Lot Help needed to walk in hospital room?: A Lot Help needed climbing 3-5 steps with a railing? : A Lot 6 Click Score: 12    End of Session Equipment Utilized During Treatment: Gait belt (entire session on room air, sats 86-93% and majority of the time 89-91%) Activity Tolerance: Patient limited by fatigue Patient left: with chair alarm set;with call bell/phone within reach;with family/visitor present Nurse Communication: Mobility status (O2 status on room air) PT Visit Diagnosis: Unsteadiness on feet (R26.81);History of falling (Z91.81);Muscle weakness (generalized) (M62.81);Difficulty in walking, not elsewhere classified (R26.2)     Time: 1121-1201 PT Time Calculation (min) (ACUTE ONLY): 40 min  Charges:  $Gait Training: 8-22 mins $Therapeutic Exercise: 8-22 mins $Therapeutic Activity: 8-22 mins                     Kreg Shropshire, DPT 06/11/2020, 1:15 PM

## 2020-06-11 NOTE — Plan of Care (Signed)

## 2020-06-11 NOTE — Discharge Summary (Signed)
Physician Discharge Summary  [name] DVV:[id number] DOB: [birth year] DOA: 06/05/2020  PCP: Glean Hess, MD  Admit date: 06/05/2020 Discharge date: 06/11/2020  Admitted From: Home Disposition: SNF  Recommendations for Outpatient Follow-up:  1. Follow up with PCP in 1-2 weeks 2. Follow-up with orthopedics as directed 3. Follow-up with cardiology in 1 to 2 weeks  Home Health: No Equipment/Devices: None Discharge Condition: Stable CODE STATUS: Full Diet recommendation: Heart Healthy  Brief/Interim Summary: 87 y.o.femalewith medical history significant forhypertension, GERD,COPD,dyslipidemia, history of left breast cancer status post chemo and radiation therapywho presents to the emergency room via EMS for evaluation of pain in her left hipfollowing a fall. Patient reports chronic right hip pain and has ambulates with a cane andintermittently with a walkerfor the last couple of months. Due to the pain in her right hip patient states that she lost her balance and felllanding on her left side. She denies hitting her head or having any loss of consciousness. She was able to get up with assistancefrom her husband but was unable tobear weight on her left lower extremity due to pain.  Left hip x-rayshows acute left hip subcapital femoral neck fracture.. Patient underwent left hip pinning on 4/1 with orthopedics.  Tolerated procedure well.  Postoperatively developed new onset atrial fibrillation with rapid ventricular response.  Cardiology consulted.  Patient remains hemodynamically stable.  Heart rate is been difficult to control.  Initially plan was for TEE/cardioversion on 4/6 however early morning approximately 5:30 AM the patient converted spontaneously to normal sinus rhythm.  TEE cardioversion on hold for now.  On day of discharge patient has maintained sinus rhythm.  Seen by cardiology who indicated that may be a mild element of fluid overload as patient had crackles in  the bases of her lungs.  Received 20 mg IV Lasix.  Is on room air at time of discharge.  Will discharge to skilled nursing facility at this time.  Follow-up with orthopedic surgery post discharge for wound check and staple removal.  Patient will be on Eliquis for anticoagulation so no need for subcutaneous Lovenox.  We will add amiodarone and a low-dose Lasix to discharge medications.  Follow-up outpatient PCP, orthopedics, cardiology.  Patient discharged in stable condition.  Discharge Diagnoses:  Principal Problem:   Fracture of femoral neck, left, closed ([place]) Active Problems:   Essential (primary) hypertension   Gastro-esophageal reflux disease without esophagitis   Stage 2 moderate COPD by GOLD classification (HCC)   Atrial fibrillation with rapid ventricular response (HCC)  Fracture of left femoral neck, Status post mechanical fall Consult orthopedic surgery s/p left hip pinning done on 06/05/2020 - WBAT -Multimodal pain regimen -Anticoagulation switch to Eliquis 5 mg twice daily.  No need for Lovenox -Discharge to skilled nursing facility -Follow-up outpatient orthopedics for wound check and staple removal  New-onset atrial fibrillation w/ RVR No reported history Hemodynamically stable Suspect physiologic stress from hip fracture as causative factor Patient converted to NSR on 4/6 at 5 AM Previously plan TEE/DCCV on hold for now Case discussed with cardiology Remains in sinus rhythm Discharged on amiodarone 400 mg twice daily x7 days, followed by 200 mg twice daily Cardiology will follow for further dose titration as outpatient Discharge to skilled nursing facility, follow-up outpatient cardiology in 1 to 2 weeks  Acute hypoxic respiratory failure Chronic COPD History of bronchiectasis No baseline oxygen requirement CT PE study negative Currently requiring 2 L Weaned off at time of discharge Lasix 20 mg p.o. daily on time of discharge  Follow-up outpatient  cardiology  Marked leukocytosis  most likely reactive Most likely stress related from recent fracture urine analysis not very impressive WBC trending down Normalized at time of discharge  Hypertension DC home hydrochlorothiazide, continue metoprolol 12.5 twice daily  Dyslipidemia Continue statins andfish oil  Hypophosphatemia - monitor  Discharge Instructions  Discharge Instructions    Diet - low sodium heart healthy   Complete by: As directed    Increase activity slowly   Complete by: As directed    No wound care   Complete by: As directed      Allergies as of 06/11/2020   No Known Allergies     Medication List    STOP taking these medications   hydrochlorothiazide 12.5 MG capsule Commonly known as: MICROZIDE     TAKE these medications   amiodarone 400 MG tablet Commonly known as: PACERONE Take 1 tablet (400 mg total) by mouth 2 (two) times daily.   amiodarone 200 MG tablet Commonly known as: PACERONE Take 1 tablet (200 mg total) by mouth 2 (two) times daily. Start taking on: June 18, 2020   apixaban 5 MG Tabs tablet Commonly known as: ELIQUIS Take 1 tablet (5 mg total) by mouth 2 (two) times daily.   ascorbic acid 500 MG tablet Commonly known as: VITAMIN C Take 1 tablet (500 mg total) by mouth daily. Start taking on: June 12, 2020 What changed:   medication strength  how much to take   atorvastatin 20 MG tablet Commonly known as: LIPITOR Take 1 tablet (20 mg total) by mouth daily. Start taking on: June 12, 2020 What changed:   medication strength  See the new instructions.   calcium-vitamin D 500-200 MG-UNIT tablet Commonly known as: OSCAL WITH D Take 1 tablet by mouth daily.   cetirizine 10 MG tablet Commonly known as: ZYRTEC Take 1 tablet (10 mg total) by mouth daily.   diclofenac Sodium 1 % Gel Commonly known as: VOLTAREN Apply 2 g topically 4 (four) times daily.   docusate sodium 100 MG capsule Commonly known as:  COLACE Take 1 capsule (100 mg total) by mouth 2 (two) times daily.   famotidine 20 MG tablet Commonly known as: PEPCID Take 1 tablet (20 mg total) by mouth 2 (two) times daily. PRN reflux   Fish Oil 1000 MG Caps Take 1 capsule (1,000 mg total) by mouth daily. What changed:   how much to take  when to take this   fluticasone 50 MCG/ACT nasal spray Commonly known as: FLONASE SHAKE LIQUID AND USE 2 SPRAYS IN EACH NOSTRIL DAILY AS NEEDED FOR ALLERGIES OR RHINITIS What changed: See the new instructions.   folic acid 1 MG tablet Commonly known as: FOLVITE Take 1 mg by mouth daily. Takes every day expect Tuesday   furosemide 20 MG tablet Commonly known as: Lasix Take 1 tablet (20 mg total) by mouth daily.   gabapentin 100 MG capsule Commonly known as: NEURONTIN Take 100 mg by mouth daily.   HYDROcodone-acetaminophen 5-325 MG tablet Commonly known as: NORCO/VICODIN Take 1-2 tablets by mouth every 6 (six) hours as needed for moderate pain.   methotrexate 2.5 MG tablet Commonly known as: RHEUMATREX Take 10 mg by mouth once a week. Tuesdays   metoprolol tartrate 25 MG tablet Commonly known as: LOPRESSOR Take 0.5 tablets (12.5 mg total) by mouth 2 (two) times daily. What changed: when to take this   Multivitamin Adult Tabs Take 1 tablet by mouth daily.   senna-docusate 8.6-50 MG tablet  Commonly known as: Senokot-S Take 1 tablet by mouth 2 (two) times daily.   vitamin B-12 1000 MCG tablet Commonly known as: CYANOCOBALAMIN Take 1 tablet (1,000 mcg total) by mouth daily.       Contact information for follow-up providers    Duanne Guess, PA-C. Schedule an appointment as soon as possible for a visit in 2 week(s).   Specialties: Orthopedic Surgery, Emergency Medicine Why: Staple removal Contact information: [place] [address] [phone]        Minna Merritts, MD. Schedule an appointment as soon as possible for a visit in 1 week(s).    Specialty: Cardiology Contact information: [place] [address] [phone]            Contact information for after-discharge care    Destination    [facility] Preferred SNF .   Service: Skilled Nursing Contact information: [address] [place] [phone]                 No Known Allergies  Consultations:  Orthopedics  Cardiology   Procedures/Studies: DG Chest 1 View  Result Date: 06/05/2020 CLINICAL DATA:  Fall, pain, left hip fracture EXAM: CHEST  1 VIEW COMPARISON:  06/20/2018 FINDINGS: Normal heart size and vascularity. Aorta atherosclerotic. Similar mild hyperinflation and apical calcified scarring. No superimposed pneumonia, collapse or consolidation. Negative for edema, effusion or pneumothorax. Trachea midline. No acute osseous finding. IMPRESSION: Stable chronic chest findings.  No interval change or acute process Aortic Atherosclerosis (ICD10-I70.0). Electronically Signed   By: Jerilynn Mages.  Shick M.D.   On: 06/05/2020 09:37   CT Head Wo Contrast  Result Date: 06/05/2020 CLINICAL DATA:  Head trauma, minor. Additional history provided: Fall this morning. EXAM: CT HEAD WITHOUT CONTRAST TECHNIQUE: Contiguous axial images were obtained from the base of the skull through the vertex without intravenous contrast. COMPARISON:  No pertinent prior exams available for comparison. FINDINGS: Brain: Mild cerebral atrophy. Mild ill-defined hypoattenuation within the cerebral white matter is nonspecific, but compatible with chronic small vessel ischemic disease. Prominent perivascular space versus chronic lacunar infarct within the inferior right lentiform nucleus (series 3, image 12) (series 4, image 27). There is no acute intracranial hemorrhage. No demarcated cortical infarct. No extra-axial fluid collection. No evidence of intracranial mass. No midline shift. Vascular: No hyperdense vessel.   Atherosclerotic calcifications. Skull: No evidence of calvarial fracture. 15 mm lucency within the left parietal calvarium with involvement of the inner table, likely reflecting a prominent venous [place]. Sinuses/Orbits: Visualized orbits show no acute finding. Trace bilateral ethmoid and left sphenoid sinus mucosal thickening. IMPRESSION: No evidence of acute intracranial abnormality. Mild cerebral atrophy and chronic small vessel ischemic disease. Prominent perivascular space versus chronic lacunar infarct within the inferior right basal ganglia. Minimal bilateral ethmoid and left sphenoid sinus mucosal thickening. Electronically Signed   By: Kellie Simmering DO   On: 06/05/2020 09:20   CT ANGIO CHEST PE W OR WO CONTRAST  Result Date: 06/08/2020 CLINICAL DATA:  87 year old female with positive D-dimer. Concern for pulmonary embolism. EXAM: CT ANGIOGRAPHY CHEST WITH CONTRAST TECHNIQUE: Multidetector CT imaging of the chest was performed using the standard protocol during bolus administration of intravenous contrast. Multiplanar CT image reconstructions and MIPs were obtained to evaluate the vascular anatomy. CONTRAST:  72mL OMNIPAQUE IOHEXOL 350 MG/ML SOLN COMPARISON:  Chest CT dated 02/07/2017 and radiograph dated 06/07/2020. FINDINGS: Cardiovascular: No cardiomegaly or pericardial effusion. Coronary vascular calcification of the  LAD. Moderate atherosclerotic calcification of the thoracic aorta. No aneurysmal dilatation or dissection. The origins of the great vessels of the aortic arch appear patent as visualized. No pulmonary artery embolus identified. Mediastinum/Nodes: Bilateral hilar and mediastinal adenopathy measure up to 15 mm in short axis in the subcarinal region. The esophagus is grossly unremarkable. No mediastinal fluid collection. Lungs/Pleura: Bilateral streaky densities with scattered pulmonary interstitial nodularity as seen on the prior CT suggestive of chronic atypical infection/inflammation. There  are small bilateral pleural effusions with subsegmental atelectasis of the lower lobes. No pneumothorax. The central airways are patent. Upper Abdomen: No acute abnormality. Musculoskeletal: No chest wall abnormality. No acute or significant osseous findings. Review of the MIP images confirms the above findings. IMPRESSION: 1. No CT evidence of pulmonary embolism. 2. Bilateral streaky densities with scattered pulmonary interstitial nodularity as seen on the prior CT suggestive of chronic atypical infection/inflammation. 3. Small bilateral pleural effusions with subsegmental atelectasis of the lower lobes. 4. Bilateral hilar and mediastinal adenopathy, likely reactive. 5. Aortic Atherosclerosis (ICD10-I70.0). Electronically Signed   By: Anner Crete M.D.   On: 06/08/2020 19:25   DG Chest Port 1 View  Result Date: 06/09/2020 CLINICAL DATA:  Paroxysmal AFib EXAM: PORTABLE CHEST 1 VIEW COMPARISON:  06/07/2020 FINDINGS: Heart is normal size. Aortic atherosclerosis. Diffuse interstitial prominence, stable since prior study, likely fibrosis. No acute confluent opacities or effusions. IMPRESSION: Stable chronic changes.  No active disease. Electronically Signed   By: Rolm Baptise M.D.   On: 06/09/2020 17:36   DG Chest Port 1 View  Result Date: 06/07/2020 CLINICAL DATA:  Shortness of breath.  Unable to move arms. EXAM: PORTABLE CHEST 1 VIEW COMPARISON:  06/05/2020 FINDINGS: Normal heart size and pulmonary vascularity. Bronchiectasis and interstitial changes in the lungs, likely fibrosis. Calcification in the apical pleura bilaterally. No airspace disease or consolidation. No pleural effusions. No pneumothorax. Mediastinal contours appear intact. Calcification of the aorta. No change since prior study. IMPRESSION: Bronchiectasis and interstitial changes in the lungs, likely fibrosis. No evidence of active pulmonary disease. Electronically Signed   By: Lucienne Capers M.D.   On: 06/07/2020 19:24   DG Shoulder  Left  Result Date: 06/05/2020 CLINICAL DATA:  Fall, left shoulder pain EXAM: LEFT SHOULDER - 2+ VIEW COMPARISON:  01/04/2013 FINDINGS: Limited two-view exam. Bones are osteopenic. Mild degenerative changes of the left AC joint without separation. No acute osseous finding or fracture. No malalignment. Included left chest demonstrates calcified apical scarring. IMPRESSION: Osteopenia and mild degenerative changes. No acute finding by plain radiography Electronically Signed   By: Jerilynn Mages.  Shick M.D.   On: 06/05/2020 09:33   ECHOCARDIOGRAM COMPLETE  Result Date: 06/10/2020    ECHOCARDIOGRAM REPORT   Patient Name:   [name] [facility] Date of Exam: 06/10/2020 Medical Rec #:  [id number]     Height:       63.0 in Accession #:    [id number]    Weight:       132.3 lb Date of Birth:  October 18, 1933     BSA:          1.622 m Patient Age:    87 years      BP:           118/58 mmHg Patient Gender: F             HR:           91 bpm. Exam Location:  [facility] Procedure: 2D Echo, Cardiac Doppler and Color Doppler Indications:  Atrial Fibrillation I48.91  History:         Patient has no prior history of Echocardiogram examinations.                  Risk Factors:Hypertension.  Sonographer:     Sherrie Sport RDCS (AE) Referring Phys:  [place] ZOXW Diagnosing Phys: Kate Sable MD  Sonographer Comments: Suboptimal apical window. IMPRESSIONS  1. Left ventricular ejection fraction, by estimation, is 65 to 70%. The left ventricle has normal function. The left ventricle has no regional wall motion abnormalities. There is mild left ventricular hypertrophy. Left ventricular diastolic parameters are consistent with Grade II diastolic dysfunction (pseudonormalization).  2. Right ventricular systolic function is normal. The right ventricular size is normal. There is moderately elevated pulmonary artery systolic pressure. The estimated right ventricular systolic pressure is 96.0 mmHg.  3. Left atrial size was mildly dilated.  4. The mitral valve  is degenerative. No evidence of mitral valve regurgitation. Mild mitral stenosis.  5. The aortic valve is tricuspid. Aortic valve regurgitation is not visualized.  6. The inferior vena cava is normal in size with greater than 50% respiratory variability, suggesting right atrial pressure of 3 mmHg. FINDINGS  Left Ventricle: Left ventricular ejection fraction, by estimation, is 65 to 70%. The left ventricle has normal function. The left ventricle has no regional wall motion abnormalities. The left ventricular internal cavity size was normal in size. There is  mild left ventricular hypertrophy. Left ventricular diastolic parameters are consistent with Grade II diastolic dysfunction (pseudonormalization). Right Ventricle: The right ventricular size is normal. No increase in right ventricular wall thickness. Right ventricular systolic function is normal. There is moderately elevated pulmonary artery systolic pressure. The tricuspid regurgitant velocity is 3.41 m/s, and with an assumed right atrial pressure of 3 mmHg, the estimated right ventricular systolic pressure is 45.4 mmHg. Left Atrium: Left atrial size was mildly dilated. Right Atrium: Right atrial size was normal in size. Pericardium: Trivial pericardial effusion is present. Mitral Valve: The mitral valve is degenerative in appearance. There is moderate thickening of the mitral valve leaflet(s). There is mild calcification of the mitral valve leaflet(s). Mild mitral annular calcification. No evidence of mitral valve regurgitation. Mild mitral valve stenosis. MV peak gradient, 7.6 mmHg. The mean mitral valve gradient is 4.0 mmHg. Tricuspid Valve: The tricuspid valve is normal in structure. Tricuspid valve regurgitation is mild. Aortic Valve: The aortic valve is tricuspid. Aortic valve regurgitation is not visualized. Aortic valve mean gradient measures 3.0 mmHg. Aortic valve peak gradient measures 5.0 mmHg. Aortic valve area, by VTI measures 1.93 cm. Pulmonic  Valve: The pulmonic valve was not well visualized. Pulmonic valve regurgitation is not visualized. Aorta: The aortic root and ascending aorta are structurally normal, with no evidence of dilitation. Venous: The inferior vena cava is normal in size with greater than 50% respiratory variability, suggesting right atrial pressure of 3 mmHg. IAS/Shunts: No atrial level shunt detected by color flow Doppler.  LEFT VENTRICLE PLAX 2D LVIDd:         2.98 cm  Diastology LVIDs:         1.46 cm  LV e' medial:    3.70 cm/s LV PW:         1.34 cm  LV E/e' medial:  34.9 LV IVS:        0.96 cm  LV e' lateral:   4.03 cm/s LVOT diam:     2.00 cm  LV E/e' lateral: 32.0 LV SV:  36 LV SV Index:   22 LVOT Area:     3.14 cm  RIGHT VENTRICLE RV Basal diam:  2.98 cm RV S prime:     12.80 cm/s TAPSE (M-mode): 3.1 cm LEFT ATRIUM           Index       RIGHT ATRIUM           Index LA diam:      3.10 cm 1.91 cm/m  RA Area:     11.80 cm LA Vol (A2C): 61.7 ml 38.04 ml/m RA Volume:   28.00 ml  17.26 ml/m LA Vol (A4C): 26.5 ml 16.34 ml/m  AORTIC VALVE                   PULMONIC VALVE AV Area (Vmax):    1.30 cm    PV Vmax:        0.63 m/s AV Area (Vmean):   1.41 cm    PV Peak grad:   1.6 mmHg AV Area (VTI):     1.93 cm    RVOT Peak grad: 1 mmHg AV Vmax:           112.00 cm/s AV Vmean:          80.500 cm/s AV VTI:            0.186 m AV Peak Grad:      5.0 mmHg AV Mean Grad:      3.0 mmHg LVOT Vmax:         46.30 cm/s LVOT Vmean:        36.100 cm/s LVOT VTI:          0.114 m LVOT/AV VTI ratio: 0.61  AORTA Ao Root diam: 2.53 cm MITRAL VALVE                TRICUSPID VALVE MV Area (PHT): 2.57 cm     TR Peak grad:   46.5 mmHg MV Area VTI:   1.16 cm     TR Vmax:        341.00 cm/s MV Peak grad:  7.6 mmHg MV Mean grad:  4.0 mmHg     SHUNTS MV Vmax:       1.38 m/s     Systemic VTI:  0.11 m MV Vmean:      89.4 cm/s    Systemic Diam: 2.00 cm MV Decel Time: 295 msec MV E velocity: 129.00 cm/s MV A velocity: 109.00 cm/s MV E/A ratio:  1.18 Kate Sable MD Electronically signed by Kate Sable MD Signature Date/Time: 06/10/2020/5:19:52 PM    Final    DG HIP OPERATIVE UNILAT W OR W/O PELVIS LEFT  Result Date: 06/05/2020 CLINICAL DATA:  Left hip pinning. EXAM: OPERATIVE LEFT HIP (WITH PELVIS IF PERFORMED) 2 VIEWS TECHNIQUE: Fluoroscopic spot image(s) were submitted for interpretation post-operatively. Fluoroscopy time, 1 minute COMPARISON:  06/05/2020 FINDINGS: Two fluoroscopic images demonstrate placement of 3 surgical screws in the left femoral head and neck. IMPRESSION: Surgical fixation of the left hip fracture. Electronically Signed   By: Markus Daft M.D.   On: 06/05/2020 16:18   DG HIP UNILAT WITH PELVIS 2-3 VIEWS LEFT  Result Date: 06/05/2020 CLINICAL DATA:  Fall, left hip pain EXAM: DG HIP (WITH OR WITHOUT PELVIS) 2-3V LEFT COMPARISON:  None. FINDINGS: There is an acute impacted left subcapital femoral neck fracture. Bones are osteopenic. Visualized pelvis intact. Right hip unremarkable. Normal SI joints for age. IMPRESSION: Acute left hip subcapital femoral neck fracture.  Electronically Signed   By: Jerilynn Mages.  Shick M.D.   On: 06/05/2020 09:35    (Echo, Carotid, EGD, Colonoscopy, ERCP)    Subjective: Patient seen and examined the day of discharge, stable no distress.  Remains in sinus rhythm.  Pain well controlled.  Stable for discharge to skilled nursing facility  Discharge Exam: Vitals:   06/11/20 0721 06/11/20 1123  BP: 128/89 (!) 129/52  Pulse: 81 88  Resp: 20 (!) 21  Temp: 97.9 F (36.6 C) 98 F (36.7 C)  SpO2: 97% 95%   Vitals:   06/11/20 0440 06/11/20 0500 06/11/20 0721 06/11/20 1123  BP: (!) 133/59  128/89 (!) 129/52  Pulse: 87  81 88  Resp: 20  20 (!) 21  Temp: 98.9 F (37.2 C)  97.9 F (36.6 C) 98 F (36.7 C)  TempSrc: Oral  Oral Oral  SpO2: 95%  97% 95%  Weight: 68.1 kg 68.1 kg    Height:        General: Pt is alert, awake, not in acute distress Cardiovascular: RRR, S1/S2 +, no rubs, no  gallops Respiratory: CTA bilaterally, no wheezing, no rhonchi Abdominal: Soft, NT, ND, bowel sounds + Extremities: no edema, no cyanosis    The results of significant diagnostics from this hospitalization (including imaging, microbiology, ancillary and laboratory) are listed below for reference.     Microbiology: Recent Results (from the past 240 hour(s))  Resp Panel by RT-PCR (Flu A&B, Covid) Nasopharyngeal Swab     Status: None   Collection Time: 06/05/20  9:54 AM   Specimen: Nasopharyngeal Swab; Nasopharyngeal(NP) swabs in vial transport medium  Result Value Ref Range Status   SARS Coronavirus 2 by RT PCR NEGATIVE NEGATIVE Final    Comment: (NOTE) SARS-CoV-2 target nucleic acids are NOT DETECTED.  The SARS-CoV-2 RNA is generally detectable in upper respiratory specimens during the acute phase of infection. The lowest concentration of SARS-CoV-2 viral copies this assay can detect is 138 copies/mL. A negative result does not preclude SARS-Cov-2 infection and should not be used as the sole basis for treatment or other patient management decisions. A negative result may occur with  improper specimen collection/handling, submission of specimen other than nasopharyngeal swab, presence of viral mutation(s) within the areas targeted by this assay, and inadequate number of viral copies(<138 copies/mL). A negative result must be combined with clinical observations, patient history, and epidemiological information. The expected result is Negative.  Fact Sheet for Patients:  EntrepreneurPulse.com.au  Fact Sheet for Healthcare Providers:  IncredibleEmployment.be  This test is no t yet approved or cleared by the [place] FDA and  has been authorized for detection and/or diagnosis of SARS-CoV-2 by FDA under an Emergency Use Authorization (EUA). This EUA will remain  in effect (meaning this test can be used) for the duration of the COVID-19  declaration under Section 564(b)(1) of the Act, 21 U.S.C.section 360bbb-3(b)(1), unless the authorization is terminated  or revoked sooner.       Influenza A by PCR NEGATIVE NEGATIVE Final   Influenza B by PCR NEGATIVE NEGATIVE Final    Comment: (NOTE) The Xpert Xpress SARS-CoV-2/FLU/RSV plus assay is intended as an aid in the diagnosis of influenza from Nasopharyngeal swab specimens and should not be used as a sole basis for treatment. Nasal washings and aspirates are unacceptable for Xpert Xpress SARS-CoV-2/FLU/RSV testing.  Fact Sheet for Patients: EntrepreneurPulse.com.au  Fact Sheet for Healthcare Providers: IncredibleEmployment.be  This test is not yet approved or cleared by the [place] FDA and has  been authorized for detection and/or diagnosis of SARS-CoV-2 by FDA under an Emergency Use Authorization (EUA). This EUA will remain in effect (meaning this test can be used) for the duration of the COVID-19 declaration under Section 564(b)(1) of the Act, 21 U.S.C. section 360bbb-3(b)(1), unless the authorization is terminated or revoked.  Performed at [facility], [address] [place], [place] [address]      Labs: BNP (last 3 results) Recent Labs    06/09/20 0603  BNP 071.2*   Basic Metabolic Panel: Recent Labs  Lab 06/07/20 0700 06/08/20 0632 06/09/20 0603 06/10/20 0146 06/10/20 0957 06/11/20 0445  NA 140 136 137 137  --  139  K 3.6 3.5 3.8 4.6  --  4.2  CL 105 100 99 101  --  103  CO2 27 28 29 29   --  29  GLUCOSE 117* 115* 110* 117*  --  103*  BUN 23 15 23  25*  --  32*  CREATININE 0.59 0.57 0.55 0.63  --  0.62  CALCIUM 8.3* 8.3* 8.7* 8.8*  --  8.7*  MG 2.2 2.2  --   --  2.3 2.2  PHOS 1.5* 1.8*  --  3.2  --  3.5   Liver Function Tests: No results for input(s): AST, ALT, ALKPHOS, BILITOT, PROT, ALBUMIN in the last 168 hours. No results for input(s): LIPASE, AMYLASE in the last 168 hours. No results  for input(s): AMMONIA in the last 168 hours. CBC: Recent Labs  Lab 06/07/20 0700 06/08/20 0632 06/09/20 0603 06/10/20 0146 06/11/20 0445  WBC 16.4* 14.4* 12.5* 10.5 11.0*  HGB 12.0 11.4* 11.2* 11.0* 11.0*  HCT 35.1* 34.0* 32.8* 32.7* 32.9*  MCV 91.9 90.4 90.1 89.8 90.9  PLT 159 152 178 195 212   Cardiac Enzymes: No results for input(s): CKTOTAL, CKMB, CKMBINDEX, TROPONINI in the last 168 hours. BNP: Invalid input(s): POCBNP CBG: No results for input(s): GLUCAP in the last 168 hours. D-Dimer Recent Labs    06/08/20 1436  DDIMER 1.71*   Hgb A1c No results for input(s): HGBA1C in the last 72 hours. Lipid Profile No results for input(s): CHOL, HDL, LDLCALC, TRIG, CHOLHDL, LDLDIRECT in the last 72 hours. Thyroid function studies Recent Labs    06/09/20 0603  TSH 3.538   Anemia work up No results for input(s): VITAMINB12, FOLATE, FERRITIN, TIBC, IRON, RETICCTPCT in the last 72 hours. Urinalysis    Component Value Date/Time   COLORURINE YELLOW (A) 06/08/2020 0330   APPEARANCEUR CLEAR (A) 06/08/2020 0330   LABSPEC 1.012 06/08/2020 0330   PHURINE 6.0 06/08/2020 0330   GLUCOSEU NEGATIVE 06/08/2020 0330   HGBUR SMALL (A) 06/08/2020 0330   BILIRUBINUR NEGATIVE 06/08/2020 0330   BILIRUBINUR neg 01/09/2020 1045   KETONESUR 5 (A) 06/08/2020 0330   PROTEINUR NEGATIVE 06/08/2020 0330   UROBILINOGEN 0.2 01/09/2020 1045   NITRITE NEGATIVE 06/08/2020 0330   LEUKOCYTESUR NEGATIVE 06/08/2020 0330   Sepsis Labs Invalid input(s): PROCALCITONIN,  WBC,  LACTICIDVEN Microbiology Recent Results (from the past 240 hour(s))  Resp Panel by RT-PCR (Flu A&B, Covid) Nasopharyngeal Swab     Status: None   Collection Time: 06/05/20  9:54 AM   Specimen: Nasopharyngeal Swab; Nasopharyngeal(NP) swabs in vial transport medium  Result Value Ref Range Status   SARS Coronavirus 2 by RT PCR NEGATIVE NEGATIVE Final    Comment: (NOTE) SARS-CoV-2 target nucleic acids are NOT DETECTED.  The  SARS-CoV-2 RNA is generally detectable in upper respiratory specimens during the acute phase of infection. The  lowest concentration of SARS-CoV-2 viral copies this assay can detect is 138 copies/mL. A negative result does not preclude SARS-Cov-2 infection and should not be used as the sole basis for treatment or other patient management decisions. A negative result may occur with  improper specimen collection/handling, submission of specimen other than nasopharyngeal swab, presence of viral mutation(s) within the areas targeted by this assay, and inadequate number of viral copies(<138 copies/mL). A negative result must be combined with clinical observations, patient history, and epidemiological information. The expected result is Negative.  Fact Sheet for Patients:  EntrepreneurPulse.com.au  Fact Sheet for Healthcare Providers:  IncredibleEmployment.be  This test is no t yet approved or cleared by the [place] FDA and  has been authorized for detection and/or diagnosis of SARS-CoV-2 by FDA under an Emergency Use Authorization (EUA). This EUA will remain  in effect (meaning this test can be used) for the duration of the COVID-19 declaration under Section 564(b)(1) of the Act, 21 U.S.C.section 360bbb-3(b)(1), unless the authorization is terminated  or revoked sooner.       Influenza A by PCR NEGATIVE NEGATIVE Final   Influenza B by PCR NEGATIVE NEGATIVE Final    Comment: (NOTE) The Xpert Xpress SARS-CoV-2/FLU/RSV plus assay is intended as an aid in the diagnosis of influenza from Nasopharyngeal swab specimens and should not be used as a sole basis for treatment. Nasal washings and aspirates are unacceptable for Xpert Xpress SARS-CoV-2/FLU/RSV testing.  Fact Sheet for Patients: EntrepreneurPulse.com.au  Fact Sheet for Healthcare Providers: IncredibleEmployment.be  This test is not yet approved or  cleared by the [place] FDA and has been authorized for detection and/or diagnosis of SARS-CoV-2 by FDA under an Emergency Use Authorization (EUA). This EUA will remain in effect (meaning this test can be used) for the duration of the COVID-19 declaration under Section 564(b)(1) of the Act, 21 U.S.C. section 360bbb-3(b)(1), unless the authorization is terminated or revoked.  Performed at [facility], [address], [place], [place] [address]      Time coordinating discharge: Over 30 minutes  SIGNED:   Sidney Ace, MD  [facility] 06/11/2020, 1:50 PM Pager   If 7PM-7AM, please contact night-coverage

## 2020-06-11 NOTE — Progress Notes (Signed)
  Subjective: 6 Days Post-Op Procedure(s) (LRB): CANNULATED HIP PINNING (Left) Patient reports pain as mild. HR improved. Mostly complaining of left proximal clavicle pain Patient is well, and has had no acute complaints or problems Plan is to go Rehab after hospital stay. Fever: no Gastrointestinal: Negative for nausea and vomiting  Objective: Vital signs in last 24 hours: Temp:  [97.9 F (36.6 C)-99.3 F (37.4 C)] 98 F (36.7 C) (04/07 1123) Pulse Rate:  [81-94] 81 (04/07 0721) Resp:  [19-21] 21 (04/07 1123) BP: (118-142)/(52-89) 128/89 (04/07 0721) SpO2:  [91 %-97 %] 97 % (04/07 0721) Weight:  [68.1 kg] 68.1 kg (04/07 0500)  Intake/Output from previous day:  Intake/Output Summary (Last 24 hours) at 06/11/2020 1131 Last data filed at 06/11/2020 1000 Gross per 24 hour  Intake 1259.37 ml  Output 1375 ml  Net -115.63 ml    Intake/Output this shift: Total I/O In: 660 [P.O.:660] Out: 50 [Urine:50]  Labs: Recent Labs    06/09/20 0603 06/10/20 0146 06/11/20 0445  HGB 11.2* 11.0* 11.0*   Recent Labs    06/10/20 0146 06/11/20 0445  WBC 10.5 11.0*  RBC 3.64* 3.62*  HCT 32.7* 32.9*  PLT 195 212   Recent Labs    06/10/20 0146 06/11/20 0445  NA 137 139  K 4.6 4.2  CL 101 103  CO2 29 29  BUN 25* 32*  CREATININE 0.63 0.62  GLUCOSE 117* 103*  CALCIUM 8.8* 8.7*   Recent Labs    06/09/20 1732  INR 1.0     EXAM General - Patient is Alert and Oriented  LUE - tender along [place] joint. No step off or tinting of skin. No shoulder tenderness LLE Extremity - Neurovascular intact Sensation intact distally Dorsiflexion/Plantar flexion intact Compartment soft Dressing/Incision - clean, dry, no drainage Motor Function - intact, moving foot and toes well on exam.     Past Medical History:  Diagnosis Date  . Allergy   . Bronchiectasis ([place])   . Cancer [facility])    left breast cancer   . GERD (gastroesophageal reflux disease)   . Hyperlipemia   . Hypertension   .  Intractable nausea and vomiting 06/20/2018    Assessment/Plan: 6 Days Post-Op Procedure(s) (LRB): CANNULATED HIP PINNING (Left) Principal Problem:   Fracture of femoral neck, left, closed (HCC) Active Problems:   Essential (primary) hypertension   Gastro-esophageal reflux disease without esophagitis   Stage 2 moderate COPD by GOLD classification (HCC)   Atrial fibrillation with rapid ventricular response (HCC)  Estimated body mass index is 26.59 kg/m as calculated from the following:   Height as of this encounter: 5\' 3"  (1.6 m).   Weight as of this encounter: 68.1 kg. Advance diet Up with therapy  Pain well controlled Labs are stable Heart rate improved - cardiology following. Plan for Cardiac imaging today. Suspect left proximal clavicle fracture - dedicated clavicle fracture ordered today.   Discharge planning.  Follow-up at [facility] clinic orthopedics in 2 weeks for staple removal.  Evaluate left shoulder for possible rotator cuff injury at orthopedics.   DVT Prophylaxis - Foot Pumps, TED hose and Heparin Weight-Bearing as tolerated to left leg  T. Rachelle Hora, PA-C Orthopaedic Surgery 06/11/2020, 11:31 AM

## 2020-06-11 NOTE — TOC Transition Note (Signed)
Transition of Care [facility]) - CM/SW Discharge Note   Patient Details  Name: [name] MRN: [id number] Date of Birth: 20-Nov-1933  [facility]) CM/SW Contact:  Kerin Salen, RN Phone Number: 06/11/2020, 3:40 PM   Clinical Narrative: Patient to be discharged to [facility] via [facility] to room 605-B. Husband in room agree to discharge plan. TOC needs met.     Final next level of care: Skilled Nursing Facility Barriers to Discharge: Barriers Resolved   Patient Goals and CMS Choice Patient states their goals for this hospitalization and ongoing recovery are:: To SNF for rehabilitation.      Discharge Placement                  Name of family member notified: Husband in room. Patient and family notified of of transfer: 06/11/20  Discharge Plan and Services   Discharge Planning Services: CM Consult            DME Arranged: N/A DME Agency: NA       HH Arranged: NA HH Agency: NA        Social Determinants of Health (SDOH) Interventions     Readmission Risk Interventions No flowsheet data found.

## 2020-06-11 NOTE — Telephone Encounter (Signed)
-----   Message from Theora Gianotti, NP sent at 06/11/2020  1:40 PM EDT ----- Would you pls arrange for f/u with either End or [facility] (or me) in 7-10 days?  Thanks,  Gerald Stabs

## 2020-06-11 NOTE — Progress Notes (Signed)
ANTICOAGULATION CONSULT NOTE   Pharmacy Consult for Heparin Indication: atrial fibrillation  Patient Measurements: Heparin Dosing Weight: 60 kg   Labs: Recent Labs    06/08/20 0632 06/09/20 0603 06/09/20 0603 06/09/20 1732 06/10/20 0146 06/10/20 0957 06/10/20 2053 06/11/20 0445  HGB 11.4* 11.2*  --   --  11.0*  --   --  11.0*  HCT 34.0* 32.8*  --   --  32.7*  --   --  32.9*  PLT 152 178  --   --  195  --   --  212  APTT  --   --   --  34  --   --   --   --   LABPROT  --   --   --  13.1  --   --   --   --   INR  --   --   --  1.0  --   --   --   --   HEPARINUNFRC  --   --    < >  --  0.33 0.27* 0.57 0.41  CREATININE 0.57 0.55  --   --  0.63  --   --   --    < > = values in this interval not displayed.    Estimated Creatinine Clearance: 48.5 mL/min (by C-G formula based on SCr of 0.63 mg/dL).   Medical History: Past Medical History:  Diagnosis Date  . Allergy   . Bronchiectasis ([place])   . Cancer [facility])    left breast cancer   . GERD (gastroesophageal reflux disease)   . Hyperlipemia   . Hypertension   . Intractable nausea and vomiting 06/20/2018    Assessment: [name] is an 87 y/o female who presented for left hip pain. She has a hx significant for HTN, left breast cancer s/p chemo and radiation therapy. She has a new onset of atrial fibrillation with RVR and was seen by cardiology today. She was previously on enoxaparin for DVT ppx started on 4/2 with her last dose given 4/5 at 0845. She will be transitioned to heparin. Pharmacy has been consulted for heparin dosing. CHADSVASC: 4.   4/6 Labs: Hgb slight downward trend 11.2>11.0. Platelets slight upward trend 178>195.   Goal of Therapy:  Heparin level 0.3-0.7  4/6 0146 HL 0.33, therapeutic x 1 4/6 0957 HL 0.27 subtherapeutic 4/6 2053 HL 0.57; therapeutic x 1, 1000 units/hr 4/7 0445 HL 0.41, therapeutic x 2   Plan:  --Heparin level is therapeutic x 2. Continue heparin infusion at current rate of 1000  units/hr --Recheck HL daily while on heparin --Daily CBC per protocol while on IV heparin  Renda Rolls, PharmD, [facility] 06/11/2020 6:15 AM

## 2020-06-11 NOTE — Progress Notes (Signed)
Reported called to nurse at [employer]. PIVs/tele removed. AVS provided in packet. Family at bedside. Discharged via medical transport.

## 2020-06-11 NOTE — Progress Notes (Signed)
Progress Note  Patient Name: [name] Date of Encounter: 06/11/2020  Primary Cardiologist: Nelva Bush, MD  Subjective   Maintaining sinus rhythm.  No chest pain, sob, or hip pain.  Inpatient Medications    Scheduled Meds: . amiodarone  400 mg Oral BID  . ascorbic acid  500 mg Oral Daily  . atorvastatin  20 mg Oral Daily  . calcium-vitamin D  1 tablet Oral Daily  . Chlorhexidine Gluconate Cloth  6 each Topical Daily  . diclofenac Sodium  2 g Topical QID  . docusate sodium  100 mg Oral BID  . famotidine  20 mg Oral BID  . feeding supplement  237 mL Oral BID BM  . fluticasone  1 spray Each Nare Daily  . gabapentin  100 mg Oral Daily  . loratadine  10 mg Oral Daily  . methotrexate  10 mg Oral Weekly  . metoprolol tartrate  12.5 mg Oral BID  . multivitamin with minerals  1 tablet Oral Daily  . omega-3 acid ethyl esters  1 g Oral Daily  . polyethylene glycol  17 g Oral Daily  . senna-docusate  1 tablet Oral BID  . tamsulosin  0.4 mg Oral Daily  . vitamin B-12  1,000 mcg Oral Daily   Continuous Infusions: . heparin 1,000 Units/hr (06/10/20 2146)  . methocarbamol (ROBAXIN) IV     PRN Meds: acetaminophen, alum & mag hydroxide-simeth, bisacodyl, folic acid, HYDROcodone-acetaminophen, menthol-cetylpyridinium **OR** phenol, methocarbamol **OR** methocarbamol (ROBAXIN) IV, metoCLOPramide **OR** metoCLOPramide (REGLAN) injection, morphine injection, ondansetron (ZOFRAN) IV   Vital Signs    Vitals:   06/10/20 2323 06/11/20 0440 06/11/20 0500 06/11/20 0721  BP: (!) 118/55 (!) 133/59  128/89  Pulse: 89 87  81  Resp:  20  20  Temp:  98.9 F (37.2 C)  97.9 F (36.6 C)  TempSrc:  Oral  Oral  SpO2:  95%  97%  Weight:  68.1 kg 68.1 kg   Height:        Intake/Output Summary (Last 24 hours) at 06/11/2020 0940 Last data filed at 06/11/2020 0938 Gross per 24 hour  Intake 1578.72 ml  Output 1375 ml  Net 203.72 ml   Filed Weights   06/05/20 0843 06/11/20 0440 06/11/20  0500  Weight: 60 kg 68.1 kg 68.1 kg    Physical Exam   GEN: Well nourished, well developed, in no acute distress.  HEENT: Grossly normal.  Neck: Supple, no JVD, carotid bruits, or masses. Cardiac: RRR, no murmurs, rubs, or gallops. No clubbing, cyanosis, edema.  Radials 2+, DP/PT 2+ and equal bilaterally.  Respiratory:  Respirations regular and unlabored,diminished breath sounds bilat w/ bibasilar crackles. GI: Soft, nontender, nondistended, BS + x 4. MS: no deformity or atrophy. Skin: warm and dry, no rash. Neuro:  Strength and sensation are intact. Psych: AAOx3.  Normal affect.  Labs    Chemistry Recent Labs  Lab 06/09/20 0603 06/10/20 0146 06/11/20 0445  NA 137 137 139  K 3.8 4.6 4.2  CL 99 101 103  CO2 29 29 29   GLUCOSE 110* 117* 103*  BUN 23 25* 32*  CREATININE 0.55 0.63 0.62  CALCIUM 8.7* 8.8* 8.7*  GFRNONAA >60 >60 >60  ANIONGAP 9 7 7      Hematology Recent Labs  Lab 06/09/20 0603 06/10/20 0146 06/11/20 0445  WBC 12.5* 10.5 11.0*  RBC 3.64* 3.64* 3.62*  HGB 11.2* 11.0* 11.0*  HCT 32.8* 32.7* 32.9*  MCV 90.1 89.8 90.9  MCH 30.8 30.2 30.4  MCHC 34.1 33.6 33.4  RDW 13.3 13.5 13.5  PLT 178 195 212    Cardiac Enzymes  Recent Labs  Lab 06/06/20 1419 06/06/20 1650  TROPONINIHS 9 11      BNP Recent Labs  Lab 06/09/20 0603  BNP 185.9*     DDimer  Recent Labs  Lab 06/08/20 1436  DDIMER 1.71*     Lipids  Lab Results  Component Value Date   CHOL 206 (H) 01/09/2020   HDL 93 01/09/2020   LDLCALC 98 01/09/2020   TRIG 86 01/09/2020   CHOLHDL 2.2 01/09/2020    Radiology    CT ANGIO CHEST PE W OR WO CONTRAST  Result Date: 06/08/2020 CLINICAL DATA:  87 year old female with positive D-dimer. Concern for pulmonary embolism. EXAM: CT ANGIOGRAPHY CHEST WITH CONTRAST TECHNIQUE: Multidetector CT imaging of the chest was performed using the standard protocol during bolus administration of intravenous contrast. Multiplanar CT image reconstructions  and MIPs were obtained to evaluate the vascular anatomy. CONTRAST:  32mL OMNIPAQUE IOHEXOL 350 MG/ML SOLN COMPARISON:  Chest CT dated 02/07/2017 and radiograph dated 06/07/2020. FINDINGS: Cardiovascular: No cardiomegaly or pericardial effusion. Coronary vascular calcification of the LAD. Moderate atherosclerotic calcification of the thoracic aorta. No aneurysmal dilatation or dissection. The origins of the great vessels of the aortic arch appear patent as visualized. No pulmonary artery embolus identified. Mediastinum/Nodes: Bilateral hilar and mediastinal adenopathy measure up to 15 mm in short axis in the subcarinal region. The esophagus is grossly unremarkable. No mediastinal fluid collection. Lungs/Pleura: Bilateral streaky densities with scattered pulmonary interstitial nodularity as seen on the prior CT suggestive of chronic atypical infection/inflammation. There are small bilateral pleural effusions with subsegmental atelectasis of the lower lobes. No pneumothorax. The central airways are patent. Upper Abdomen: No acute abnormality. Musculoskeletal: No chest wall abnormality. No acute or significant osseous findings. Review of the MIP images confirms the above findings. IMPRESSION: 1. No CT evidence of pulmonary embolism. 2. Bilateral streaky densities with scattered pulmonary interstitial nodularity as seen on the prior CT suggestive of chronic atypical infection/inflammation. 3. Small bilateral pleural effusions with subsegmental atelectasis of the lower lobes. 4. Bilateral hilar and mediastinal adenopathy, likely reactive. 5. Aortic Atherosclerosis (ICD10-I70.0). Electronically Signed   By: Anner Crete M.D.   On: 06/08/2020 19:25   DG Chest Port 1 View  Result Date: 06/09/2020 CLINICAL DATA:  Paroxysmal AFib EXAM: PORTABLE CHEST 1 VIEW COMPARISON:  06/07/2020 FINDINGS: Heart is normal size. Aortic atherosclerosis. Diffuse interstitial prominence, stable since prior study, likely fibrosis. No acute  confluent opacities or effusions. IMPRESSION: Stable chronic changes.  No active disease. Electronically Signed   By: Rolm Baptise M.D.   On: 06/09/2020 17:36   DG Chest Port 1 View  Result Date: 06/07/2020 CLINICAL DATA:  Shortness of breath.  Unable to move arms. EXAM: PORTABLE CHEST 1 VIEW COMPARISON:  06/05/2020 FINDINGS: Normal heart size and pulmonary vascularity. Bronchiectasis and interstitial changes in the lungs, likely fibrosis. Calcification in the apical pleura bilaterally. No airspace disease or consolidation. No pleural effusions. No pneumothorax. Mediastinal contours appear intact. Calcification of the aorta. No change since prior study. IMPRESSION: Bronchiectasis and interstitial changes in the lungs, likely fibrosis. No evidence of active pulmonary disease. Electronically Signed   By: Lucienne Capers M.D.   On: 06/07/2020 19:24   Telemetry    RSR, rare PVCs - Personally Reviewed  Cardiac Studies   2D Echocardiogram 4.6.2022  1. Left ventricular ejection fraction, by estimation, is 65 to 70%. The  left ventricle  has normal function. The left ventricle has no regional  wall motion abnormalities. There is mild left ventricular hypertrophy.  Left ventricular diastolic parameters  are consistent with Grade II diastolic dysfunction (pseudonormalization).   2. Right ventricular systolic function is normal. The right ventricular  size is normal. There is moderately elevated pulmonary artery systolic  pressure. The estimated right ventricular systolic pressure is 58.8 mmHg.   3. Left atrial size was mildly dilated.   4. The mitral valve is degenerative. No evidence of mitral valve  regurgitation. Mild mitral stenosis.   5. The aortic valve is tricuspid. Aortic valve regurgitation is not  visualized.   6. The inferior vena cava is normal in size with greater than 50%  respiratory variability, suggesting right atrial pressure of 3 mmHg.    Patient Profile     87 y.o. female  with history of hypertension, breast cancer, COPD presenting after a fall resulting in left femur fracture.  Underwent surgical procedure and developed Afib w/ RVR  converted to sinus rhythm.  CHA2DS2VASc = 4-5 (cor/Ao Ca2+ noted on CT).  Assessment & Plan    1.  L Femoral Neck Fx: s/p pinning on 4/1.  Per surgical team.  2.  Afib RVR:  Developed 4/5.  Converted on digoxin and  blocker.  Amio added and maintaining sinus rhythm.  Currently on heparin [CHA2DS2VASc = 4-5 (cor/Ao Ca2+ noted on CT)].  H/H has been stable.  If no plans for further intervention, rec changing to eliquis 5mg  bid.  As prev noted, cont amio 400 BID x 6 more days, and then reduce to 200 bid.  Will look to wean further to 200 daily as outpt.  Will need outpt sleep eval, esp in light of RVSP of 49.13mmHg.  3.  Diastolic dysfxn/PAH:  Echo w/ nl EF and grII DD.  RVSP elevated @ 49.28mmHg.  I/O +3.5L for admission.  Wt listed as up 8.1kg, though question accuracy as she does not appear to be markedly volume overloaded.  Crackles noted however, and she reports chronic DOE.  Will give 1 dose of IV lasix 20mg  today.  May need low-dose oral diuretic @ home, as well.  As above, will need outpt sleep study for PAH.  4.  Essential HTN:  Stable.on  blocker.  Signed, Murray Hodgkins, NP  06/11/2020, 9:40 AM    For questions or updates, please contact   Please consult [email or web address] for contact info under Cardiology/STEMI.

## 2020-06-12 ENCOUNTER — Encounter: Payer: Self-pay | Admitting: Cardiology

## 2020-06-12 DIAGNOSIS — J441 Chronic obstructive pulmonary disease with (acute) exacerbation: Secondary | ICD-10-CM | POA: Diagnosis not present

## 2020-06-12 DIAGNOSIS — I10 Essential (primary) hypertension: Secondary | ICD-10-CM | POA: Diagnosis not present

## 2020-06-12 DIAGNOSIS — S72002D Fracture of unspecified part of neck of left femur, subsequent encounter for closed fracture with routine healing: Secondary | ICD-10-CM | POA: Diagnosis not present

## 2020-06-12 DIAGNOSIS — I4891 Unspecified atrial fibrillation: Secondary | ICD-10-CM | POA: Diagnosis not present

## 2020-06-12 DIAGNOSIS — D72829 Elevated white blood cell count, unspecified: Secondary | ICD-10-CM | POA: Diagnosis not present

## 2020-06-12 DIAGNOSIS — E785 Hyperlipidemia, unspecified: Secondary | ICD-10-CM | POA: Diagnosis not present

## 2020-06-15 DIAGNOSIS — I10 Essential (primary) hypertension: Secondary | ICD-10-CM | POA: Diagnosis not present

## 2020-06-15 DIAGNOSIS — S72002D Fracture of unspecified part of neck of left femur, subsequent encounter for closed fracture with routine healing: Secondary | ICD-10-CM | POA: Diagnosis not present

## 2020-06-15 DIAGNOSIS — I4891 Unspecified atrial fibrillation: Secondary | ICD-10-CM | POA: Diagnosis not present

## 2020-06-16 ENCOUNTER — Ambulatory Visit: Payer: Medicare PPO | Admitting: Physical Therapy

## 2020-06-16 DIAGNOSIS — I4891 Unspecified atrial fibrillation: Secondary | ICD-10-CM | POA: Diagnosis not present

## 2020-06-16 DIAGNOSIS — R110 Nausea: Secondary | ICD-10-CM | POA: Diagnosis not present

## 2020-06-16 DIAGNOSIS — S72002D Fracture of unspecified part of neck of left femur, subsequent encounter for closed fracture with routine healing: Secondary | ICD-10-CM | POA: Diagnosis not present

## 2020-06-16 NOTE — Telephone Encounter (Signed)
Left mom to schedule

## 2020-06-16 NOTE — Telephone Encounter (Signed)
-----   Message from Marykay Lex sent at 06/12/2020  3:34 PM EDT ----- Regarding: 7-10 day fu LVM for patient to call back ----- Message ----- From: Theora Gianotti, NP Sent: 06/11/2020   1:41 PM EDT To: C[facility] Div [facility] Scheduling  Would you pls arrange for f/u with either End or [facility] (or me) in 7-10 days?  Thanks,  Gerald Stabs

## 2020-06-17 DIAGNOSIS — S72002D Fracture of unspecified part of neck of left femur, subsequent encounter for closed fracture with routine healing: Secondary | ICD-10-CM | POA: Diagnosis not present

## 2020-06-17 DIAGNOSIS — R110 Nausea: Secondary | ICD-10-CM | POA: Diagnosis not present

## 2020-06-17 DIAGNOSIS — I4891 Unspecified atrial fibrillation: Secondary | ICD-10-CM | POA: Diagnosis not present

## 2020-06-17 NOTE — Telephone Encounter (Signed)
Patient family member calling  States patient is in [facility] unable to move at this time  Would like to wait to schedule

## 2020-06-18 ENCOUNTER — Ambulatory Visit: Payer: Medicare PPO | Admitting: Physical Therapy

## 2020-06-18 ENCOUNTER — Encounter: Payer: Medicare PPO | Admitting: Physical Therapy

## 2020-06-19 ENCOUNTER — Inpatient Hospital Stay
Admission: EM | Admit: 2020-06-19 | Discharge: 2020-06-24 | DRG: 698 | Disposition: A | Discharge status: Home Health | Payer: Medicare PPO | Attending: Internal Medicine | Admitting: Internal Medicine

## 2020-06-19 ENCOUNTER — Inpatient Hospital Stay: Payer: Medicare PPO

## 2020-06-19 DIAGNOSIS — R197 Diarrhea, unspecified: Secondary | ICD-10-CM | POA: Diagnosis not present

## 2020-06-19 DIAGNOSIS — Z7901 Long term (current) use of anticoagulants: Secondary | ICD-10-CM | POA: Diagnosis not present

## 2020-06-19 DIAGNOSIS — R110 Nausea: Secondary | ICD-10-CM | POA: Diagnosis present

## 2020-06-19 DIAGNOSIS — G9341 Metabolic encephalopathy: Secondary | ICD-10-CM | POA: Diagnosis present

## 2020-06-19 DIAGNOSIS — Z85828 Personal history of other malignant neoplasm of skin: Secondary | ICD-10-CM | POA: Diagnosis not present

## 2020-06-19 DIAGNOSIS — Z923 Personal history of irradiation: Secondary | ICD-10-CM

## 2020-06-19 DIAGNOSIS — D72829 Elevated white blood cell count, unspecified: Secondary | ICD-10-CM | POA: Diagnosis not present

## 2020-06-19 DIAGNOSIS — W19XXXA Unspecified fall, initial encounter: Secondary | ICD-10-CM | POA: Diagnosis present

## 2020-06-19 DIAGNOSIS — T83511A Infection and inflammatory reaction due to indwelling urethral catheter, initial encounter: Secondary | ICD-10-CM | POA: Diagnosis not present

## 2020-06-19 DIAGNOSIS — I10 Essential (primary) hypertension: Secondary | ICD-10-CM | POA: Diagnosis present

## 2020-06-19 DIAGNOSIS — S72012A Unspecified intracapsular fracture of left femur, initial encounter for closed fracture: Secondary | ICD-10-CM | POA: Diagnosis not present

## 2020-06-19 DIAGNOSIS — Z66 Do not resuscitate: Secondary | ICD-10-CM | POA: Diagnosis present

## 2020-06-19 DIAGNOSIS — Z978 Presence of other specified devices: Secondary | ICD-10-CM

## 2020-06-19 DIAGNOSIS — E876 Hypokalemia: Secondary | ICD-10-CM | POA: Diagnosis not present

## 2020-06-19 DIAGNOSIS — I480 Paroxysmal atrial fibrillation: Secondary | ICD-10-CM

## 2020-06-19 DIAGNOSIS — N390 Urinary tract infection, site not specified: Secondary | ICD-10-CM

## 2020-06-19 DIAGNOSIS — Z79899 Other long term (current) drug therapy: Secondary | ICD-10-CM

## 2020-06-19 DIAGNOSIS — E785 Hyperlipidemia, unspecified: Secondary | ICD-10-CM | POA: Diagnosis present

## 2020-06-19 DIAGNOSIS — K5900 Constipation, unspecified: Secondary | ICD-10-CM | POA: Diagnosis present

## 2020-06-19 DIAGNOSIS — Z9049 Acquired absence of other specified parts of digestive tract: Secondary | ICD-10-CM | POA: Diagnosis not present

## 2020-06-19 DIAGNOSIS — R609 Edema, unspecified: Secondary | ICD-10-CM | POA: Diagnosis present

## 2020-06-19 DIAGNOSIS — Z853 Personal history of malignant neoplasm of breast: Secondary | ICD-10-CM | POA: Diagnosis not present

## 2020-06-19 DIAGNOSIS — S72002D Fracture of unspecified part of neck of left femur, subsequent encounter for closed fracture with routine healing: Secondary | ICD-10-CM | POA: Diagnosis not present

## 2020-06-19 DIAGNOSIS — K219 Gastro-esophageal reflux disease without esophagitis: Secondary | ICD-10-CM | POA: Diagnosis present

## 2020-06-19 DIAGNOSIS — J449 Chronic obstructive pulmonary disease, unspecified: Secondary | ICD-10-CM | POA: Diagnosis present

## 2020-06-19 DIAGNOSIS — Z803 Family history of malignant neoplasm of breast: Secondary | ICD-10-CM

## 2020-06-19 DIAGNOSIS — Z9221 Personal history of antineoplastic chemotherapy: Secondary | ICD-10-CM

## 2020-06-19 DIAGNOSIS — Z87440 Personal history of urinary (tract) infections: Secondary | ICD-10-CM | POA: Diagnosis not present

## 2020-06-19 DIAGNOSIS — R531 Weakness: Secondary | ICD-10-CM | POA: Diagnosis not present

## 2020-06-19 DIAGNOSIS — Z20822 Contact with and (suspected) exposure to covid-19: Secondary | ICD-10-CM | POA: Diagnosis present

## 2020-06-19 DIAGNOSIS — I4891 Unspecified atrial fibrillation: Secondary | ICD-10-CM | POA: Diagnosis not present

## 2020-06-19 DIAGNOSIS — Z90710 Acquired absence of both cervix and uterus: Secondary | ICD-10-CM | POA: Diagnosis not present

## 2020-06-19 DIAGNOSIS — E860 Dehydration: Secondary | ICD-10-CM | POA: Diagnosis present

## 2020-06-19 DIAGNOSIS — R0602 Shortness of breath: Secondary | ICD-10-CM

## 2020-06-19 HISTORY — DX: Metabolic encephalopathy: G93.41

## 2020-06-19 LAB — COMPREHENSIVE METABOLIC PANEL
ALT: 22 U/L (ref 0–44)
AST: 18 U/L (ref 15–41)
Albumin: 3.6 g/dL (ref 3.5–5.0)
Alkaline Phosphatase: 90 U/L (ref 38–126)
Anion gap: 10 (ref 5–15)
BUN: 21 mg/dL (ref 8–23)
CO2: 26 mmol/L (ref 22–32)
Calcium: 8.7 mg/dL — ABNORMAL LOW (ref 8.9–10.3)
Chloride: 103 mmol/L (ref 98–111)
Creatinine, Ser: 0.9 mg/dL (ref 0.44–1.00)
GFR, Estimated: >60 mL/min (ref >60)
Glucose, Bld: 102 mg/dL — ABNORMAL HIGH (ref 70–99)
Potassium: 3.9 mmol/L (ref 3.5–5.1)
Sodium: 139 mmol/L (ref 135–145)
Total Bilirubin: 0.7 mg/dL (ref 0.3–1.2)
Total Protein: 6.8 g/dL (ref 6.5–8.1)

## 2020-06-19 LAB — URINALYSIS, COMPLETE (UACMP) WITH MICROSCOPIC
Bilirubin Urine: NEGATIVE
Glucose, UA: NEGATIVE mg/dL
Ketones, ur: 20 mg/dL — AB
Nitrite: NEGATIVE
Protein, ur: NEGATIVE mg/dL
RBC / HPF: >50 RBC/hpf — ABNORMAL HIGH (ref 0–5)
Specific Gravity, Urine: 1.016 (ref 1.005–1.030)
WBC, UA: >50 WBC/hpf — ABNORMAL HIGH (ref 0–5)
pH: 6 (ref 5.0–8.0)

## 2020-06-19 LAB — CBC WITH DIFFERENTIAL/PLATELET
Abs Immature Granulocytes: 0.24 10*3/uL — ABNORMAL HIGH (ref 0.00–0.07)
Basophils Absolute: 0.1 10*3/uL (ref 0.0–0.1)
Basophils Relative: 1 %
Eosinophils Absolute: 0.3 10*3/uL (ref 0.0–0.5)
Eosinophils Relative: 1 %
HCT: 37.9 % (ref 36.0–46.0)
Hemoglobin: 12.5 g/dL (ref 12.0–15.0)
Immature Granulocytes: 1 %
Lymphocytes Relative: 8 %
Lymphs Abs: 1.6 10*3/uL (ref 0.7–4.0)
MCH: 30.6 pg (ref 26.0–34.0)
MCHC: 33 g/dL (ref 30.0–36.0)
MCV: 92.9 fL (ref 80.0–100.0)
Monocytes Absolute: 1.1 10*3/uL — ABNORMAL HIGH (ref 0.1–1.0)
Monocytes Relative: 6 %
Neutro Abs: 15.9 10*3/uL — ABNORMAL HIGH (ref 1.7–7.7)
Neutrophils Relative %: 83 %
Platelets: 371 10*3/uL (ref 150–400)
RBC: 4.08 MIL/uL (ref 3.87–5.11)
RDW: 14.5 % (ref 11.5–15.5)
WBC: 19.3 10*3/uL — ABNORMAL HIGH (ref 4.0–10.5)
nRBC: 0 % (ref 0.0–0.2)

## 2020-06-19 LAB — RESP PANEL BY RT-PCR (FLU A&B, COVID) ARPGX2
Influenza A by PCR: NEGATIVE
Influenza B by PCR: NEGATIVE
SARS Coronavirus 2 by RT PCR: NEGATIVE

## 2020-06-19 LAB — TROPONIN I (HIGH SENSITIVITY)
Troponin I (High Sensitivity): 9 ng/L (ref <18)
Troponin I (High Sensitivity): 8 ng/L (ref <18)

## 2020-06-19 LAB — LACTIC ACID, PLASMA: Lactic Acid, Venous: 0.9 mmol/L (ref 0.5–1.9)

## 2020-06-19 MED ORDER — SODIUM CHLORIDE 0.9 % IV BOLUS
1000.0000 mL | Freq: Once | INTRAVENOUS | Status: AC
  Administered 2020-06-19: 1000 mL via INTRAVENOUS

## 2020-06-19 MED ORDER — GABAPENTIN 100 MG PO CAPS
100.0000 mg | ORAL_CAPSULE | Freq: Every day | ORAL | Status: DC
  Administered 2020-06-21 – 2020-06-24 (×4): 100 mg via ORAL
  Filled 2020-06-19 (×5): qty 1

## 2020-06-19 MED ORDER — NYSTATIN 100000 UNIT/GM EX POWD
Freq: Two times a day (BID) | CUTANEOUS | Status: DC
  Filled 2020-06-19: qty 15

## 2020-06-19 MED ORDER — ATORVASTATIN CALCIUM 20 MG PO TABS
20.0000 mg | ORAL_TABLET | Freq: Every day | ORAL | Status: DC
  Administered 2020-06-21 – 2020-06-24 (×4): 20 mg via ORAL
  Filled 2020-06-19 (×5): qty 1

## 2020-06-19 MED ORDER — FUROSEMIDE 40 MG PO TABS
20.0000 mg | ORAL_TABLET | Freq: Every day | ORAL | Status: DC
  Administered 2020-06-20: 20 mg via ORAL
  Filled 2020-06-19: qty 1

## 2020-06-19 MED ORDER — METHOTREXATE 2.5 MG PO TABS: 10.0000 mg | ORAL_TABLET | ORAL | Status: DC

## 2020-06-19 MED ORDER — ACETAMINOPHEN 650 MG RE SUPP: 650.0000 mg | Freq: Four times a day (QID) | RECTAL | Status: DC | PRN

## 2020-06-19 MED ORDER — VITAMIN B-12 1000 MCG PO TABS
1000.0000 ug | ORAL_TABLET | Freq: Every day | ORAL | Status: DC
  Filled 2020-06-19 (×2): qty 1

## 2020-06-19 MED ORDER — FAMOTIDINE 20 MG PO TABS
20.0000 mg | ORAL_TABLET | Freq: Every day | ORAL | Status: DC
  Administered 2020-06-19 – 2020-06-23 (×5): 20 mg via ORAL
  Filled 2020-06-19 (×5): qty 1

## 2020-06-19 MED ORDER — AMIODARONE HCL 200 MG PO TABS
200.0000 mg | ORAL_TABLET | Freq: Two times a day (BID) | ORAL | Status: DC
  Administered 2020-06-19 – 2020-06-24 (×10): 200 mg via ORAL
  Filled 2020-06-19 (×10): qty 1

## 2020-06-19 MED ORDER — SODIUM CHLORIDE 0.9 % IV SOLN
2.0000 g | Freq: Once | INTRAVENOUS | Status: AC
  Administered 2020-06-19: 2 g via INTRAVENOUS

## 2020-06-19 MED ORDER — SODIUM CHLORIDE 0.9 % IV SOLN
2.0000 g | INTRAVENOUS | Status: DC
  Administered 2020-06-20 – 2020-06-22 (×3): 2 g via INTRAVENOUS
  Filled 2020-06-19: qty 20
  Filled 2020-06-19 (×2): qty 2
  Filled 2020-06-19: qty 20

## 2020-06-19 MED ORDER — ONDANSETRON HCL 4 MG PO TABS
4.0000 mg | ORAL_TABLET | Freq: Four times a day (QID) | ORAL | Status: DC | PRN
  Administered 2020-06-20 – 2020-06-23 (×3): 4 mg via ORAL
  Filled 2020-06-19 (×4): qty 1

## 2020-06-19 MED ORDER — APIXABAN 5 MG PO TABS
5.0000 mg | ORAL_TABLET | Freq: Two times a day (BID) | ORAL | Status: DC
  Administered 2020-06-19 – 2020-06-24 (×10): 5 mg via ORAL
  Filled 2020-06-19 (×11): qty 1

## 2020-06-19 MED ORDER — FLUTICASONE PROPIONATE 50 MCG/ACT NA SUSP
1.0000 | Freq: Every day | NASAL | Status: DC
  Administered 2020-06-20 – 2020-06-23 (×4): 1 via NASAL
  Filled 2020-06-19: qty 16

## 2020-06-19 MED ORDER — METOPROLOL TARTRATE 25 MG PO TABS
12.5000 mg | ORAL_TABLET | Freq: Two times a day (BID) | ORAL | Status: DC
  Administered 2020-06-19 – 2020-06-24 (×10): 12.5 mg via ORAL
  Filled 2020-06-19 (×10): qty 1

## 2020-06-19 MED ORDER — FISH OIL 1000 MG PO CAPS: 1.0000 | ORAL_CAPSULE | Freq: Every day | ORAL | Status: DC

## 2020-06-19 MED ORDER — ASCORBIC ACID 500 MG PO TABS
500.0000 mg | ORAL_TABLET | Freq: Every day | ORAL | Status: DC
  Administered 2020-06-21: 500 mg via ORAL
  Filled 2020-06-19 (×2): qty 1

## 2020-06-19 MED ORDER — ADULT MULTIVITAMIN W/MINERALS CH
1.0000 | ORAL_TABLET | Freq: Every day | ORAL | Status: DC
  Administered 2020-06-21: 1 via ORAL
  Filled 2020-06-19 (×2): qty 1

## 2020-06-19 MED ORDER — SENNOSIDES-DOCUSATE SODIUM 8.6-50 MG PO TABS
1.0000 | ORAL_TABLET | Freq: Two times a day (BID) | ORAL | Status: DC
  Administered 2020-06-19 – 2020-06-21 (×2): 1 via ORAL
  Filled 2020-06-19 (×5): qty 1

## 2020-06-19 MED ORDER — SODIUM CHLORIDE 0.9 % IV SOLN: INTRAVENOUS | Status: AC

## 2020-06-19 MED ORDER — CALCIUM CARBONATE-VITAMIN D 500-200 MG-UNIT PO TABS
1.0000 | ORAL_TABLET | Freq: Every day | ORAL | Status: DC
  Administered 2020-06-21: 1 via ORAL
  Filled 2020-06-19 (×2): qty 1

## 2020-06-19 MED ORDER — PANTOPRAZOLE SODIUM 40 MG IV SOLR
40.0000 mg | INTRAVENOUS | Status: DC
  Administered 2020-06-19 – 2020-06-22 (×4): 40 mg via INTRAVENOUS
  Filled 2020-06-19 (×4): qty 40

## 2020-06-19 MED ORDER — HYDROCODONE-ACETAMINOPHEN 5-325 MG PO TABS
1.0000 | ORAL_TABLET | Freq: Four times a day (QID) | ORAL | Status: DC | PRN
  Administered 2020-06-19: 2 via ORAL
  Filled 2020-06-19: qty 2

## 2020-06-19 MED ORDER — ONDANSETRON HCL 4 MG/2ML IJ SOLN
4.0000 mg | Freq: Four times a day (QID) | INTRAMUSCULAR | Status: DC | PRN
  Administered 2020-06-20 – 2020-06-21 (×2): 4 mg via INTRAVENOUS
  Filled 2020-06-19 (×2): qty 2

## 2020-06-19 MED ORDER — POLYETHYLENE GLYCOL 3350 17 G PO PACK
17.0000 g | PACK | Freq: Every day | ORAL | Status: DC
  Filled 2020-06-19 (×2): qty 1

## 2020-06-19 MED ORDER — LORATADINE 10 MG PO TABS
10.0000 mg | ORAL_TABLET | Freq: Every day | ORAL | Status: DC
  Administered 2020-06-21 – 2020-06-24 (×4): 10 mg via ORAL
  Filled 2020-06-19 (×5): qty 1

## 2020-06-19 MED ORDER — ACETAMINOPHEN 325 MG PO TABS: 650.0000 mg | ORAL_TABLET | Freq: Four times a day (QID) | ORAL | Status: DC | PRN

## 2020-06-19 MED ORDER — FOLIC ACID 1 MG PO TABS
1.0000 mg | ORAL_TABLET | Freq: Every day | ORAL | Status: DC
  Administered 2020-06-21: 1 mg via ORAL
  Filled 2020-06-19 (×3): qty 1

## 2020-06-19 MED ORDER — DOCUSATE SODIUM 100 MG PO CAPS
100.0000 mg | ORAL_CAPSULE | Freq: Two times a day (BID) | ORAL | Status: DC
  Administered 2020-06-19 – 2020-06-21 (×2): 100 mg via ORAL
  Filled 2020-06-19 (×5): qty 1

## 2020-06-19 NOTE — ED Triage Notes (Signed)
Pt BIB [facility] for weakness and nausea from [facility]. Pt has had sx x1 week. Pt is currently being treated for UTI with antibiotcs for 2 days.

## 2020-06-19 NOTE — ED Notes (Signed)
Report from [place], [place]

## 2020-06-19 NOTE — ED Provider Notes (Signed)
[facility] Emergency Department Provider Note  ____________________________________________   Event Date/Time   First MD Initiated Contact with Patient 06/19/20 1044     (approximate)  I have reviewed the triage vital signs and the nursing notes.   HISTORY  Chief Complaint Weakness and Nausea    HPI [name] is a 87 y.o. female with history of hypertension, hyperlipidemia, here with generalized weakness.  The patient has recently been at a skilled nursing facility.  She is being treated for UTI.  According to family, she has been increasingly weak and nauseous despite taking antibiotics for the last several days.  She has not wanted to eat or drink anything.  She has been slightly more confused.  She has been unable to transfer or walk as well as she normally does.  She has a history of recurrent UTIs with similar presentations.  She currently has an indwelling catheter due to retention.  He states that it was last changed 2 weeks ago.  No alleviating factors.        Past Medical History:  Diagnosis Date  . Allergy   . Bronchiectasis ([place])   . Cancer [facility])    left breast cancer   . GERD (gastroesophageal reflux disease)   . Hyperlipemia   . Hypertension   . Intractable nausea and vomiting 06/20/2018    Patient Active Problem List   Diagnosis Date Noted  . Acute metabolic encephalopathy 75/12/2583  . Foley catheter in place on admission 06/19/2020  . AF (paroxysmal atrial fibrillation) ([place]) 06/19/2020  . Atrial fibrillation with rapid ventricular response ([place]) 06/09/2020  . Fracture of femoral neck, left, closed ([place]) 06/05/2020  . Acute lower UTI 06/05/2020  . Atherosclerosis of aorta ([place]) 01/09/2020  . Stage 2 moderate COPD by GOLD classification ([place]) 01/09/2020  . HSV (herpes simplex virus) infection 01/09/2020  . BCC (basal cell carcinoma), face 10/12/2017  . Environmental and seasonal allergies 10/30/2014  . Psoriasis vulgaris  10/30/2014  . Essential (primary) hypertension 10/30/2014  . Gastro-esophageal reflux disease without esophagitis 10/30/2014  . Hot flash, menopausal 10/30/2014  . Hyperlipidemia, mixed 10/30/2014  . History of cancer of left breast 10/30/2014  . Bronchiectasis ([place]) 09/24/2013    Past Surgical History:  Procedure Laterality Date  . BREAST LUMPECTOMY Left 2008  . CHOLECYSTECTOMY    . HIP PINNING,CANNULATED Left 06/05/2020   Procedure: CANNULATED HIP PINNING;  Surgeon: Hessie Knows, MD;  Location: [facility] ORS;  Service: Orthopedics;  Laterality: Left;  . PARTIAL HYSTERECTOMY    . TEE WITHOUT CARDIOVERSION N/A 06/10/2020   Procedure: TRANSESOPHAGEAL ECHOCARDIOGRAM (TEE);  Surgeon: Kate Sable, MD;  Location: [facility] ORS;  Service: Cardiovascular;  Laterality: N/A;    Prior to Admission medications   Medication Sig Start Date End Date Taking? Authorizing Provider  apixaban (ELIQUIS) 5 MG TABS tablet Take 1 tablet (5 mg total) by mouth 2 (two) times daily. 06/11/20  Yes Sreenath, Sudheer B, MD  ascorbic acid (VITAMIN C) 500 MG tablet Take 1 tablet (500 mg total) by mouth daily. 06/12/20  Yes Sreenath, Sudheer B, MD  atorvastatin (LIPITOR) 20 MG tablet Take 1 tablet (20 mg total) by mouth daily. 06/12/20  Yes Sreenath, Sudheer B, MD  calcium-vitamin D (OSCAL WITH D) 500-200 MG-UNIT tablet Take 1 tablet by mouth daily. 06/11/20  Yes Sreenath, Sudheer B, MD  cetirizine (ZYRTEC) 10 MG tablet Take 1 tablet (10 mg total) by mouth daily. 06/11/20  Yes Sreenath, Sudheer B, MD  diclofenac Sodium (VOLTAREN) 1 %  GEL Apply 2 g topically 4 (four) times daily. 06/11/20  Yes Sreenath, Sudheer B, MD  fluticasone (FLONASE) 50 MCG/ACT nasal spray SHAKE LIQUID AND USE 2 SPRAYS IN EACH NOSTRIL DAILY AS NEEDED FOR ALLERGIES OR RHINITIS 06/05/20  Yes Glean Hess, MD  folic acid (FOLVITE) 1 MG tablet Take 1 mg by mouth daily. Takes every day expect Tuesday   Yes [provider]  furosemide (LASIX) 20 MG tablet  Take 1 tablet (20 mg total) by mouth daily. 06/11/20 06/11/21 Yes Sreenath, Sudheer B, MD  gabapentin (NEURONTIN) 100 MG capsule Take 100 mg by mouth daily. 05/21/20  Yes [provider]  methotrexate (RHEUMATREX) 2.5 MG tablet Take 10 mg by mouth once a week. Tuesdays   Yes [provider]  metoprolol tartrate (LOPRESSOR) 25 MG tablet Take 0.5 tablets (12.5 mg total) by mouth 2 (two) times daily. 06/11/20  Yes Sreenath, Sudheer B, MD  Multiple Vitamin (MULTIVITAMIN ADULT) TABS Take 1 tablet by mouth daily. 06/11/20  Yes Sreenath, Trula Slade, MD  Omega-3 Fatty Acids (FISH OIL) 1000 MG CAPS Take 1 capsule (1,000 mg total) by mouth daily. 06/11/20  Yes Sreenath, Sudheer B, MD  vitamin B-12 (CYANOCOBALAMIN) 1000 MCG tablet Take 1 tablet (1,000 mcg total) by mouth daily. 06/11/20  Yes Sreenath, Sudheer B, MD  amiodarone (PACERONE) 200 MG tablet Take 1 tablet (200 mg total) by mouth 2 (two) times daily. Patient not taking: No sig reported 06/18/20   Sidney Ace, MD  amiodarone (PACERONE) 400 MG tablet Take 1 tablet (400 mg total) by mouth 2 (two) times daily. Patient not taking: No sig reported 06/11/20   Ralene Muskrat B, MD  docusate sodium (COLACE) 100 MG capsule Take 1 capsule (100 mg total) by mouth 2 (two) times daily. 06/11/20   Sidney Ace, MD  famotidine (PEPCID) 20 MG tablet Take 1 tablet (20 mg total) by mouth 2 (two) times daily. PRN reflux 06/11/20   Sidney Ace, MD  HYDROcodone-acetaminophen (NORCO/VICODIN) 5-325 MG tablet Take 1-2 tablets by mouth every 6 (six) hours as needed for moderate pain. Patient not taking: No sig reported 06/06/20   Reche Dixon, PA-C  senna-docusate (SENOKOT-S) 8.6-50 MG tablet Take 1 tablet by mouth 2 (two) times daily. 06/11/20   Sidney Ace, MD    Allergies Patient has no known allergies.  Family History  Problem Relation Age of Onset  . Breast cancer Sister     Social History Social History   Tobacco Use  . Smoking  status: Never Smoker  . Smokeless tobacco: Never Used  Vaping Use  . Vaping Use: Never used  Substance Use Topics  . Alcohol use: No  . Drug use: No    Review of Systems  Review of Systems  Constitutional: Positive for appetite change. Negative for fatigue and fever.  HENT: Negative for congestion and sore throat.   Eyes: Negative for visual disturbance.  Respiratory: Negative for cough and shortness of breath.   Cardiovascular: Negative for chest pain.  Gastrointestinal: Positive for nausea. Negative for abdominal pain, diarrhea and vomiting.  Genitourinary: Positive for dysuria. Negative for flank pain.  Musculoskeletal: Negative for back pain and neck pain.  Skin: Negative for rash and wound.  Neurological: Positive for weakness.  All other systems reviewed and are negative.    ____________________________________________  PHYSICAL EXAM:      VITAL SIGNS: ED Triage Vitals [06/19/20 1034]  Enc Vitals Group     BP  Pulse      Resp      Temp      Temp src      SpO2 96 %     Weight 135 lb (61.2 kg)     Height 5\' 5"  (1.651 m)     Head Circumference      Peak Flow      Pain Score 8     Pain Loc      Pain Edu?      Excl. in [place]?      Physical Exam Vitals and nursing note reviewed.  Constitutional:      General: She is not in acute distress.    Appearance: She is well-developed.  HENT:     Head: Normocephalic and atraumatic.  Eyes:     Conjunctiva/sclera: Conjunctivae normal.  Cardiovascular:     Rate and Rhythm: Normal rate and regular rhythm.     Heart sounds: Normal heart sounds. No murmur heard. No friction rub.  Pulmonary:     Effort: Pulmonary effort is normal. No respiratory distress.     Breath sounds: Normal breath sounds. No wheezing or rales.  Abdominal:     General: There is no distension.     Palpations: Abdomen is soft.     Tenderness: There is abdominal tenderness (mild suprapubic).     Comments: subQ IV in place, no erythema, no  redness, mild abd wall edema/fluid noted  Genitourinary:    Comments: Foley in place Musculoskeletal:     Cervical back: Neck supple.  Skin:    General: Skin is warm.     Capillary Refill: Capillary refill takes less than 2 seconds.  Neurological:     Mental Status: She is alert and oriented to person, place, and time.     Motor: No abnormal muscle tone.       ____________________________________________   LABS (all labs ordered are listed, but only abnormal results are displayed)  Labs Reviewed  CBC WITH DIFFERENTIAL/PLATELET - Abnormal; Notable for the following components:      Result Value   WBC 19.3 (*)    Neutro Abs 15.9 (*)    Monocytes Absolute 1.1 (*)    Abs Immature Granulocytes 0.24 (*)    All other components within normal limits  COMPREHENSIVE METABOLIC PANEL - Abnormal; Notable for the following components:   Glucose, Bld 102 (*)    Calcium 8.7 (*)    All other components within normal limits  URINALYSIS, COMPLETE (UACMP) WITH MICROSCOPIC - Abnormal; Notable for the following components:   Color, Urine YELLOW (*)    APPearance CLOUDY (*)    Hgb urine dipstick LARGE (*)    Ketones, ur 20 (*)    Leukocytes,Ua LARGE (*)    RBC / HPF >50 (*)    WBC, UA >50 (*)    Bacteria, UA FEW (*)    All other components within normal limits  RESP PANEL BY RT-PCR (FLU A&B, COVID) ARPGX2  URINE CULTURE  LACTIC ACID, PLASMA  CBC  BASIC METABOLIC PANEL  TROPONIN I (HIGH SENSITIVITY)  TROPONIN I (HIGH SENSITIVITY)    ____________________________________________  EKG:  ________________________________________  RADIOLOGY All imaging, including plain films, CT scans, and ultrasounds, independently reviewed by me, and interpretations confirmed via formal radiology reads.  ED MD interpretation:   CXR: ? Acute process on chronic changes XR Abd: nonobstructive  Official radiology report(s): DG Chest 1 View  Result Date: 06/19/2020 CLINICAL DATA:  Weakness and  nausea. EXAM: CHEST  1  VIEW COMPARISON:  06/09/2020 and 06/05/2020 FINDINGS: Heart size is normal. There is atherosclerotic calcification of the thoracic aorta. There is chronic perihilar peribronchial thickening and prominence of interstitial markings. Increased faint pulmonary opacities bilaterally indicate acute edema or infectious process superimposed on chronic changes. No frank consolidations. There are no pleural effusions. IMPRESSION: 1. Chronic lung changes. 2. Acute edema or infectious process superimposed on chronic changes. Electronically Signed   By: Nolon Nations M.D.   On: 06/19/2020 15:28   DG Abd 1 View  Result Date: 06/19/2020 CLINICAL DATA:  Weakness and nausea. EXAM: ABDOMEN - 1 VIEW COMPARISON:  None. FINDINGS: Bowel gas pattern is nonobstructive. Surgical clips are identified in the RIGHT UPPER QUADRANT. There is no free intraperitoneal air. Previous LEFT hip ORIF. IMPRESSION: The nonobstructive bowel gas pattern. Electronically Signed   By: Nolon Nations M.D.   On: 06/19/2020 15:29    ____________________________________________  PROCEDURES   Procedure(s) performed (including Critical Care):  Procedures  ____________________________________________  INITIAL IMPRESSION / MDM / [place] / ED COURSE  As part of my medical decision making, I reviewed the following data within the [place] notes reviewed and incorporated, Old chart reviewed, Notes from prior ED visits, and [place] Controlled Substance Database       *DAJON ROWE was evaluated in Emergency Department on 06/19/2020 for the symptoms described in the history of present illness. She was evaluated in the context of the global COVID-19 pandemic, which necessitated consideration that the patient might be at risk for infection with the SARS-CoV-2 virus that causes COVID-19. Institutional protocols and algorithms that pertain to the evaluation of patients at risk for COVID-19  are in a state of rapid change based on information released by regulatory bodies including the CDC and federal and state organizations. These policies and algorithms were followed during the patient's care in the ED.  Some ED evaluations and interventions may be delayed as a result of limited staffing during the pandemic.*     Medical Decision Making:  87 yo F here with decreased appetite, confusion in setting of recent UTI. Pt believes she has been on ABX at facility but I'm unable to locate paperwork/MAR - will call facility. Lab work today shows leukocytosis, likely ongoing UTI. Foley replaced, will start empiric ABX and fluids. Pt updated and in agreement. Abd soft, no focal TTP, rebound or guarding. Admit to medicine.   ____________________________________________  FINAL CLINICAL IMPRESSION(S) / ED DIAGNOSES  Final diagnoses:  Lower urinary tract infectious disease  Dehydration     MEDICATIONS GIVEN DURING THIS VISIT:  Medications  HYDROcodone-acetaminophen (NORCO/VICODIN) 5-325 MG per tablet 1-2 tablet (2 tablets Oral Given 06/19/20 1712)  methotrexate (RHEUMATREX) tablet 10 mg (has no administration in time range)  amiodarone (PACERONE) tablet 200 mg (has no administration in time range)  atorvastatin (LIPITOR) tablet 20 mg (has no administration in time range)  furosemide (LASIX) tablet 20 mg (has no administration in time range)  metoprolol tartrate (LOPRESSOR) tablet 12.5 mg (has no administration in time range)  docusate sodium (COLACE) capsule 100 mg (has no administration in time range)  famotidine (PEPCID) tablet 20 mg (has no administration in time range)  senna-docusate (Senokot-S) tablet 1 tablet (has no administration in time range)  apixaban (ELIQUIS) tablet 5 mg (has no administration in time range)  folic acid (FOLVITE) tablet 1 mg (has no administration in time range)  vitamin B-12 (CYANOCOBALAMIN) tablet 1,000 mcg (has no administration in time range)  gabapentin  (  NEURONTIN) capsule 100 mg (has no administration in time range)  ascorbic acid (VITAMIN C) tablet 500 mg (has no administration in time range)  calcium-vitamin D (OSCAL WITH D) 500-200 MG-UNIT per tablet 1 tablet (has no administration in time range)  Multivitamin Adult TABS 1 tablet (has no administration in time range)  Fish Oil CAPS 1,000 mg (has no administration in time range)  loratadine (CLARITIN) tablet 10 mg (has no administration in time range)  fluticasone (FLONASE) 50 MCG/ACT nasal spray 1 spray (has no administration in time range)  0.9 %  sodium chloride infusion ( Intravenous New Bag/Given 06/19/20 1711)  acetaminophen (TYLENOL) tablet 650 mg (has no administration in time range)    Or  acetaminophen (TYLENOL) suppository 650 mg (has no administration in time range)  ondansetron (ZOFRAN) tablet 4 mg (has no administration in time range)    Or  ondansetron (ZOFRAN) injection 4 mg (has no administration in time range)  cefTRIAXone (ROCEPHIN) 2 g in sodium chloride 0.9 % 100 mL IVPB (has no administration in time range)  pantoprazole (PROTONIX) injection 40 mg (40 mg Intravenous Given 06/19/20 1711)  polyethylene glycol (MIRALAX / GLYCOLAX) packet 17 g (has no administration in time range)  nystatin (MYCOSTATIN/NYSTOP) topical powder (has no administration in time range)  cefTRIAXone (ROCEPHIN) 2 g in sodium chloride 0.9 % 100 mL IVPB (0 g Intravenous Stopped 06/19/20 1504)  sodium chloride 0.9 % bolus 1,000 mL (0 mLs Intravenous Stopped 06/19/20 1611)     ED Discharge Orders    None       Note:  This document was prepared using Dragon voice recognition software and may include unintentional dictation errors.   Duffy Bruce, MD 06/19/20 1810

## 2020-06-19 NOTE — ED Notes (Signed)
Pt AOX4, NAD. Pt repositioned in bed and hospital gown placed. Pt c/o left shoulder pain from collar bone fracture and "bottom" pain. Honeycomb dressing noted on left hip, dressing is clean and dry. Intra-abdominal line noted in RLQ. Sacrum is warm, dry, intact, pink.

## 2020-06-19 NOTE — ED Notes (Signed)
RN waiting for IV team at this time. RN unable to get PIV access.

## 2020-06-19 NOTE — H&P (Signed)
History and Physical    OREAN GIARRATANO GTX:[id number] DOB: Apr 27, 1933 DOA: 06/19/2020  PCP: Glean Hess, MD   Patient coming from: Home  I have personally briefly reviewed patient's old medical records in [place]  Chief Complaint: Poor oral intake                               Weakness  HPI: [name] is a 87 y.o. female with medical history significant for Rylie P for hypertension, GERD, COPD, dyslipidemia, history of left breast cancer status post chemo and radiation therapy who presents to the emergency room via EMS from the skilled nursing facility where she was discharged to for subacute rehab following left hip pinning for an acute left  subcapital femoral neck fracture. Her husband who is at the bedside states that she has continued to decline since her discharge to the skilled nursing facility and has had poor oral intake, constipation and generalized weakness. Patient states that she has had persistent nausea but denies having any vomiting.  She has abdominal pain mostly in the suprapubic area which she rates a 5 to 6 x 10 in intensity at its worst.  She denies having any fever or chills.  She was discharged with an indwelling Foley catheter because she had difficulty voiding. She denies having any chest pain, no shortness of breath, no headache, no blurred vision, no cough, no diaphoresis, no palpitations. Labs show sodium 139, potassium 3.9, chloride 103, bicarb 26, glucose 102, BUN 21, creatinine 0.90, calcium 8.7, alkaline phosphatase 90, albumin 3.6, AST 18, ALT 22, total protein 6.8, white count 19.3, hemoglobin 12.5, hematocrit 37.9, MCV 92.9, RDW 14.5, platelet count 371 Respiratory viral panel is negative Patient has pyuria     ED Course: Patient is an 87 year old Caucasian female who was recently discharged to skilled nursing facility for subacute rehab following the repair of her left subcapital femoral neck fracture.  Per her husband she has continued to  decline since her discharge to the skilled nursing facility and presents today for evaluation of poor oral intake and suprapubic pain.  She has an indwelling Foley catheter and pyuria.  She will be admitted to the hospital for further evaluation.   Review of Systems: As per HPI otherwise all other systems reviewed and negative.    Past Medical History:  Diagnosis Date  . Allergy   . Bronchiectasis ([place])   . Cancer [facility])    left breast cancer   . GERD (gastroesophageal reflux disease)   . Hyperlipemia   . Hypertension   . Intractable nausea and vomiting 06/20/2018    Past Surgical History:  Procedure Laterality Date  . BREAST LUMPECTOMY Left 2008  . CHOLECYSTECTOMY    . HIP PINNING,CANNULATED Left 06/05/2020   Procedure: CANNULATED HIP PINNING;  Surgeon: Hessie Knows, MD;  Location: [facility] ORS;  Service: Orthopedics;  Laterality: Left;  . PARTIAL HYSTERECTOMY    . TEE WITHOUT CARDIOVERSION N/A 06/10/2020   Procedure: TRANSESOPHAGEAL ECHOCARDIOGRAM (TEE);  Surgeon: Kate Sable, MD;  Location: [facility] ORS;  Service: Cardiovascular;  Laterality: N/A;     reports that she has never smoked. She has never used smokeless tobacco. She reports that she does not drink alcohol and does not use drugs.  No Known Allergies  Family History  Problem Relation Age of Onset  . Breast cancer Sister       Prior to Admission medications   Medication Sig  Start Date End Date Taking? Authorizing Provider  amiodarone (PACERONE) 200 MG tablet Take 1 tablet (200 mg total) by mouth 2 (two) times daily. 06/18/20   Sidney Ace, MD  amiodarone (PACERONE) 400 MG tablet Take 1 tablet (400 mg total) by mouth 2 (two) times daily. 06/11/20   Sreenath, Trula Slade, MD  apixaban (ELIQUIS) 5 MG TABS tablet Take 1 tablet (5 mg total) by mouth 2 (two) times daily. 06/11/20   Sidney Ace, MD  ascorbic acid (VITAMIN C) 500 MG tablet Take 1 tablet (500 mg total) by mouth daily. 06/12/20   Sidney Ace, MD   atorvastatin (LIPITOR) 20 MG tablet Take 1 tablet (20 mg total) by mouth daily. 06/12/20   Sidney Ace, MD  calcium-vitamin D (OSCAL WITH D) 500-200 MG-UNIT tablet Take 1 tablet by mouth daily. 06/11/20   Sidney Ace, MD  cetirizine (ZYRTEC) 10 MG tablet Take 1 tablet (10 mg total) by mouth daily. 06/11/20   Sidney Ace, MD  diclofenac Sodium (VOLTAREN) 1 % GEL Apply 2 g topically 4 (four) times daily. 06/11/20   Sreenath, Trula Slade, MD  docusate sodium (COLACE) 100 MG capsule Take 1 capsule (100 mg total) by mouth 2 (two) times daily. 06/11/20   Sidney Ace, MD  famotidine (PEPCID) 20 MG tablet Take 1 tablet (20 mg total) by mouth 2 (two) times daily. PRN reflux 06/11/20   Ralene Muskrat B, MD  fluticasone (FLONASE) 50 MCG/ACT nasal spray SHAKE LIQUID AND USE 2 SPRAYS IN EACH NOSTRIL DAILY AS NEEDED FOR ALLERGIES OR RHINITIS 06/05/20   Glean Hess, MD  folic acid (FOLVITE) 1 MG tablet Take 1 mg by mouth daily. Takes every day expect Tuesday    [provider]  furosemide (LASIX) 20 MG tablet Take 1 tablet (20 mg total) by mouth daily. 06/11/20 06/11/21  Sidney Ace, MD  gabapentin (NEURONTIN) 100 MG capsule Take 100 mg by mouth daily. 05/21/20   [provider]  HYDROcodone-acetaminophen (NORCO/VICODIN) 5-325 MG tablet Take 1-2 tablets by mouth every 6 (six) hours as needed for moderate pain. 06/06/20   Reche Dixon, PA-C  methotrexate (RHEUMATREX) 2.5 MG tablet Take 10 mg by mouth once a week. Tuesdays    [provider]  metoprolol tartrate (LOPRESSOR) 25 MG tablet Take 0.5 tablets (12.5 mg total) by mouth 2 (two) times daily. 06/11/20   Sidney Ace, MD  Multiple Vitamin (MULTIVITAMIN ADULT) TABS Take 1 tablet by mouth daily. 06/11/20   Sreenath, Trula Slade, MD  Omega-3 Fatty Acids (FISH OIL) 1000 MG CAPS Take 1 capsule (1,000 mg total) by mouth daily. 06/11/20   Sreenath, Trula Slade, MD  senna-docusate (SENOKOT-S) 8.6-50 MG tablet Take 1  tablet by mouth 2 (two) times daily. 06/11/20   Sidney Ace, MD  vitamin B-12 (CYANOCOBALAMIN) 1000 MCG tablet Take 1 tablet (1,000 mcg total) by mouth daily. 06/11/20   Sidney Ace, MD    Physical Exam: Vitals:   06/19/20 1034 06/19/20 1200 06/19/20 1203 06/19/20 1425  BP:  (!) 153/55  (!) 172/67  Pulse:  73  83  Resp:   15   Temp:   97.7 F (36.5 C)   TempSrc:   Oral   SpO2: 96% 95%  96%  Weight: 61.2 kg     Height: 5\' 5"  (1.651 m)        Vitals:   06/19/20 1034 06/19/20 1200 06/19/20 1203 06/19/20 1425  BP:  (!) 153/55  Marland Kitchen)  172/67  Pulse:  73  83  Resp:   15   Temp:   97.7 F (36.5 C)   TempSrc:   Oral   SpO2: 96% 95%  96%  Weight: 61.2 kg     Height: 5\' 5"  (1.651 m)         Constitutional: Alert and oriented x 3 . Not in any apparent distress HEENT:      Head: Normocephalic and atraumatic.         Eyes: PERLA, EOMI, Conjunctivae are normal. Sclera is non-icteric.       Mouth/Throat: Mucous membranes are dry      Neck: Supple with no signs of meningismus. Cardiovascular: Regular rate and rhythm. No murmurs, gallops, or rubs. 2+ symmetrical distal pulses are present . No JVD. No LE edema Respiratory: Respiratory effort normal .Lungs sounds clear bilaterally. No wheezes, crackles, or rhonchi.  Gastrointestinal: Soft, suprapubic tenderness, and non distended with positive bowel sounds.  Genitourinary: No CVA tenderness. Musculoskeletal: Nontender with normal range of motion in all extremities. No cyanosis, or erythema of extremities. Neurologic:  Face is symmetric. Moving all extremities. No gross focal neurologic deficits  Skin: Skin is warm, dry.  Red rash in the groin area Psychiatric: Mood and affect are normal   Labs on Admission: I have personally reviewed following labs and imaging studies  CBC: Recent Labs  Lab 06/19/20 1233  WBC 19.3*  NEUTROABS 15.9*  HGB 12.5  HCT 37.9  MCV 92.9  PLT 161   Basic Metabolic Panel: Recent Labs  Lab  06/19/20 1233  NA 139  K 3.9  CL 103  CO2 26  GLUCOSE 102*  BUN 21  CREATININE 0.90  CALCIUM 8.7*   GFR: Estimated Creatinine Clearance: 41.9 mL/min (by C-G formula based on SCr of 0.9 mg/dL). Liver Function Tests: Recent Labs  Lab 06/19/20 1233  AST 18  ALT 22  ALKPHOS 90  BILITOT 0.7  PROT 6.8  ALBUMIN 3.6   No results for input(s): LIPASE, AMYLASE in the last 168 hours. No results for input(s): AMMONIA in the last 168 hours. Coagulation Profile: No results for input(s): INR, PROTIME in the last 168 hours. Cardiac Enzymes: No results for input(s): CKTOTAL, CKMB, CKMBINDEX, TROPONINI in the last 168 hours. BNP (last 3 results) No results for input(s): PROBNP in the last 8760 hours. HbA1C: No results for input(s): HGBA1C in the last 72 hours. CBG: No results for input(s): GLUCAP in the last 168 hours. Lipid Profile: No results for input(s): CHOL, HDL, LDLCALC, TRIG, CHOLHDL, LDLDIRECT in the last 72 hours. Thyroid Function Tests: No results for input(s): TSH, T4TOTAL, FREET4, T3FREE, THYROIDAB in the last 72 hours. Anemia Panel: No results for input(s): VITAMINB12, FOLATE, FERRITIN, TIBC, IRON, RETICCTPCT in the last 72 hours. Urine analysis:    Component Value Date/Time   COLORURINE YELLOW (A) 06/19/2020 1233   APPEARANCEUR CLOUDY (A) 06/19/2020 1233   LABSPEC 1.016 06/19/2020 1233   PHURINE 6.0 06/19/2020 1233   GLUCOSEU NEGATIVE 06/19/2020 1233   HGBUR LARGE (A) 06/19/2020 1233   BILIRUBINUR NEGATIVE 06/19/2020 1233   BILIRUBINUR neg 01/09/2020 1045   KETONESUR 20 (A) 06/19/2020 1233   PROTEINUR NEGATIVE 06/19/2020 1233   UROBILINOGEN 0.2 01/09/2020 1045   NITRITE NEGATIVE 06/19/2020 1233   LEUKOCYTESUR LARGE (A) 06/19/2020 1233    Radiological Exams on Admission: DG Chest 1 View  Result Date: 06/19/2020 CLINICAL DATA:  Weakness and nausea. EXAM: CHEST  1 VIEW COMPARISON:  06/09/2020 and 06/05/2020 FINDINGS: Heart size  is normal. There is  atherosclerotic calcification of the thoracic aorta. There is chronic perihilar peribronchial thickening and prominence of interstitial markings. Increased faint pulmonary opacities bilaterally indicate acute edema or infectious process superimposed on chronic changes. No frank consolidations. There are no pleural effusions. IMPRESSION: 1. Chronic lung changes. 2. Acute edema or infectious process superimposed on chronic changes. Electronically Signed   By: Nolon Nations M.D.   On: 06/19/2020 15:28   DG Abd 1 View  Result Date: 06/19/2020 CLINICAL DATA:  Weakness and nausea. EXAM: ABDOMEN - 1 VIEW COMPARISON:  None. FINDINGS: Bowel gas pattern is nonobstructive. Surgical clips are identified in the RIGHT UPPER QUADRANT. There is no free intraperitoneal air. Previous LEFT hip ORIF. IMPRESSION: The nonobstructive bowel gas pattern. Electronically Signed   By: Nolon Nations M.D.   On: 06/19/2020 15:29     Assessment/Plan Principal Problem:   Acute lower UTI Active Problems:   Essential (primary) hypertension   Gastro-esophageal reflux disease without esophagitis   Foley catheter in place on admission   AF (paroxysmal atrial fibrillation) ([place])     UTI in a patient with an indwelling Foley catheter Patient has pyuria and marked leukocytosis We will treat patient empirically with Rocephin 1 g IV daily until urine culture results become available Patient will likely benefit from a voiding trial prior to discharge    Persistent nausea/constipation Probably related to opioid therapy that the patient is on following repair of her left hip fracture. Supportive care with antiemetics Decrease frequency of administration of opioids Place patient on a scheduled bowel regimen   Paroxysmal atrial fibrillation Continue metoprolol and amiodarone for rate control Continue Eliquis as primary prophylaxis for an acute stroke    GERD Place patient on Protonix   Dyslipidemia Continue fish  oil and atorvastatin    Status post left hip fracture Continue physical therapy  DVT prophylaxis: Apixaban Code Status: full code Family Communication: Greater than 50% of time was spent discussing patient's condition and plan of care with her and her husband at the bedside.  All questions and concerns have been addressed.  They verbalized understanding and agree with the plan.  CODE STATUS is a full code. Disposition Plan: Back to previous home environment Consults called: none Status: At the time of admission, it appears that the appropriate admission status for this patient is inpatient. This is judged to be reasonable and necessary in order to provide the required intensity of service to ensure the patient's safety given the presenting symptoms, physical exam findings, and initial radiographic and laboratory data in the context of their comorbid conditions. Patient requires inpatient status due to high intensity of service, high risk for further deterioration and high frequency of surveillance required.    Collier Bullock MD [facility]     06/19/2020, 3:43 PM

## 2020-06-20 ENCOUNTER — Inpatient Hospital Stay: Payer: Medicare PPO

## 2020-06-20 LAB — CBC
HCT: 34.4 % — ABNORMAL LOW (ref 36.0–46.0)
Hemoglobin: 11.4 g/dL — ABNORMAL LOW (ref 12.0–15.0)
MCH: 30.4 pg (ref 26.0–34.0)
MCHC: 33.1 g/dL (ref 30.0–36.0)
MCV: 91.7 fL (ref 80.0–100.0)
Platelets: 374 10*3/uL (ref 150–400)
RBC: 3.75 MIL/uL — ABNORMAL LOW (ref 3.87–5.11)
RDW: 14.4 % (ref 11.5–15.5)
WBC: 18.7 10*3/uL — ABNORMAL HIGH (ref 4.0–10.5)
nRBC: 0 % (ref 0.0–0.2)

## 2020-06-20 LAB — BASIC METABOLIC PANEL
Anion gap: 8 (ref 5–15)
BUN: 14 mg/dL (ref 8–23)
CO2: 25 mmol/L (ref 22–32)
Calcium: 7.9 mg/dL — ABNORMAL LOW (ref 8.9–10.3)
Chloride: 107 mmol/L (ref 98–111)
Creatinine, Ser: 0.67 mg/dL (ref 0.44–1.00)
GFR, Estimated: >60 mL/min (ref >60)
Glucose, Bld: 99 mg/dL (ref 70–99)
Potassium: 3.6 mmol/L (ref 3.5–5.1)
Sodium: 140 mmol/L (ref 135–145)

## 2020-06-20 LAB — IRON AND TIBC
Iron: 28 ug/dL (ref 28–170)
Saturation Ratios: 10 % — ABNORMAL LOW (ref 10.4–31.8)
TIBC: 279 ug/dL (ref 250–450)
UIBC: 251 ug/dL

## 2020-06-20 LAB — PHOSPHORUS: Phosphorus: 2.2 mg/dL — ABNORMAL LOW (ref 2.5–4.6)

## 2020-06-20 LAB — FOLATE: Folate: 31 ng/mL (ref >5.9)

## 2020-06-20 LAB — MAGNESIUM: Magnesium: 1.9 mg/dL (ref 1.7–2.4)

## 2020-06-20 LAB — VITAMIN B12: Vitamin B-12: 2002 pg/mL — ABNORMAL HIGH (ref 180–914)

## 2020-06-20 LAB — VITAMIN D 25 HYDROXY (VIT D DEFICIENCY, FRACTURES): Vit D, 25-Hydroxy: 32.34 ng/mL (ref 30–100)

## 2020-06-20 MED ORDER — K PHOS MONO-SOD PHOS DI & MONO 155-852-130 MG PO TABS
500.0000 mg | ORAL_TABLET | Freq: Three times a day (TID) | ORAL | Status: DC
  Administered 2020-06-20 (×3): 500 mg via ORAL
  Filled 2020-06-20 (×5): qty 2

## 2020-06-20 MED ORDER — POTASSIUM CHLORIDE CRYS ER 20 MEQ PO TBCR
40.0000 meq | EXTENDED_RELEASE_TABLET | Freq: Once | ORAL | Status: DC
  Filled 2020-06-20: qty 2

## 2020-06-20 MED ORDER — POLYSACCHARIDE IRON COMPLEX 150 MG PO CAPS
150.0000 mg | ORAL_CAPSULE | Freq: Every day | ORAL | Status: DC
  Administered 2020-06-20 – 2020-06-23 (×4): 150 mg via ORAL
  Filled 2020-06-20 (×5): qty 1

## 2020-06-20 MED ORDER — HYDRALAZINE HCL 20 MG/ML IJ SOLN: 10.0000 mg | Freq: Four times a day (QID) | INTRAMUSCULAR | Status: DC | PRN

## 2020-06-20 MED ORDER — MELATONIN 5 MG PO TABS
5.0000 mg | ORAL_TABLET | Freq: Every day | ORAL | Status: DC
  Administered 2020-06-20 – 2020-06-23 (×4): 5 mg via ORAL
  Filled 2020-06-20 (×4): qty 1

## 2020-06-20 MED ORDER — SODIUM CHLORIDE 0.9 % IV SOLN
500.0000 mg | INTRAVENOUS | Status: DC
  Administered 2020-06-20 – 2020-06-22 (×3): 500 mg via INTRAVENOUS
  Filled 2020-06-20 (×5): qty 500

## 2020-06-20 MED ORDER — PROCHLORPERAZINE EDISYLATE 10 MG/2ML IJ SOLN
10.0000 mg | Freq: Once | INTRAMUSCULAR | Status: AC
  Administered 2020-06-20: 10 mg via INTRAVENOUS
  Filled 2020-06-20: qty 2

## 2020-06-20 MED ORDER — CHLORHEXIDINE GLUCONATE CLOTH 2 % EX PADS
6.0000 | MEDICATED_PAD | Freq: Every day | CUTANEOUS | Status: DC
  Administered 2020-06-20 – 2020-06-22 (×3): 6 via TOPICAL

## 2020-06-20 MED ORDER — ENSURE ENLIVE PO LIQD
237.0000 mL | Freq: Three times a day (TID) | ORAL | Status: DC
  Administered 2020-06-20 – 2020-06-24 (×6): 237 mL via ORAL

## 2020-06-20 NOTE — Plan of Care (Signed)
  Problem: Clinical Measurements: Goal: Will remain free from infection Outcome: Progressing   Problem: Nutrition: Goal: Adequate nutrition will be maintained Outcome: Progressing   Problem: Coping: Goal: Level of anxiety will decrease Outcome: Progressing   Problem: Pain Managment: Goal: General experience of comfort will improve Outcome: Progressing   Problem: Safety: Goal: Ability to remain free from injury will improve Outcome: Progressing   Problem: Skin Integrity: Goal: Risk for impaired skin integrity will decrease Outcome: Progressing

## 2020-06-20 NOTE — Progress Notes (Signed)
[facility] Progress Note  Patient: [name]    KCL:[id number]  DOA: 06/19/2020     Date of Service: the patient was seen and examined on 06/20/2020  Chief Complaint  Patient presents with  . Weakness  . Nausea   Brief hospital course:  [name] is a 87 y.o. female with medical history significant for [name], GERD, COPD, dyslipidemia, history of left breast cancer status post chemo and radiation therapywho presents to the emergency room via EMS from the skilled nursing facility where she was discharged to for subacute rehab following left hip pinning for an acute left  subcapital femoral neck fracture. Her husband who is at the bedside states that she has continued to decline since her discharge to the skilled nursing facility and has had poor oral intake, constipation and generalized weakness. Patient states that she has had persistent nausea but denies having any vomiting.  She has abdominal pain mostly in the suprapubic area which she rates a 5 to 6 x 10 in intensity at its worst.  She denies having any fever or chills.  She was discharged with an indwelling Foley catheter because she had difficulty voiding. She denies having any chest pain, no shortness of breath, no headache, no blurred vision, no cough, no diaphoresis, no palpitations. Labs show sodium 139, potassium 3.9, chloride 103, bicarb 26, glucose 102, BUN 21, creatinine 0.90, calcium 8.7, alkaline phosphatase 90, albumin 3.6, AST 18, ALT 22, total protein 6.8, white count 19.3, hemoglobin 12.5, hematocrit 37.9, MCV 92.9, RDW 14.5, platelet count 371 Respiratory viral panel is negative Patient has pyuria   ED Course: Patient is an 87 year old Caucasian female who was recently discharged to skilled nursing facility for subacute rehab following the repair of her left subcapital femoral neck fracture.  Per her husband she has continued to decline since her discharge to the skilled nursing facility and  presents today for evaluation of poor oral intake and suprapubic pain.  She has an indwelling Foley catheter and pyuria.  She will be admitted to the hospital for further evaluation.  Assessment and Plan:   # UTI in a patient with an indwelling Foley catheter Patient has pyuria and marked leukocytosis Continue ceftriaxone 2 g IV daily  Follow urine culture Patient has indwelling Foley catheter which will be removed when patient is ambulatory Foley catheter was changed in the ED Follow renal sonogram to rule out pyelonephritis   # Possible community-acquired pneumonia CXR Chronic lung changes. Acute edema or infectious process superimposed on chronicchanges. started azithromycin 500 mg IV daily Trend WBC count   # Persistent nausea/constipation Probably related to opioid therapy that the patient is on following repair of her left hip fracture. Supportive care with antiemetics Decrease frequency of administration of opioids Continue scheduled bowel regimen We will try Compazine 10 mg x 1 dose IV   # Hypophosphatemia secondary to poor nutrition, started oral supplement. Started Ensure supplement  # Iron deficiency, iron saturation 10%, started oral iron supplement with vitamin C.   # Paroxysmal atrial fibrillation Continue metoprolol and amiodarone for rate control Continue Eliquis as primary prophylaxis for an acute stroke  # Hypertension, continue metoprolol 12.5 p.o. twice daily IV hydralazine as needed Blood pressure seems to be elevated secondary to discomfort   # GERD Place patient on Protonix   # Dyslipidemia Continue fish oil and atorvastatin    # Status post left hip fracture Continue physical therapy   Body mass index is 22.47 kg/m.  Interventions:         Diet: Heart healthy DVT Prophylaxis: Therapeutic Anticoagulation with Eliquis   Advance goals of care discussion: Full code  Family Communication: family was present at bedside,  at the time of interview.  The pt provided permission to discuss medical plan with the family. Opportunity was given to ask question and all questions were answered satisfactorily.   Disposition:  Pt is from SNF, admitted with AKI and possible PNA, still has leukocytosis, urine culture pending, on IV antibiotics, which precludes a safe discharge. Discharge to SNF when medically stable, may need 2 to 3 days more.  Subjective: No acute overnight issues, patient still does not feel good, could not sleep last night, still has persistent nausea for more than a week and decreased appetite Most likely her symptoms are secondary to acute infection which will take time to resolve, family was reassured.  We will continue above management for now.    Physical Exam: General:  alert oriented to time, place, and person.  Appear in moderate distress, affect depressed Eyes: PERRLA ENT: Oral Mucosa Clear, moist  Neck: no JVD,  Cardiovascular: S1 and S2 Present, no Murmur,  Respiratory: good respiratory effort, Bilateral Air entry equal and Decreased, mild  Crackles, no wheezes Abdomen: Bowel Sound present, Soft and no tenderness,  Skin: no rashes Extremities: no Pedal edema, no calf tenderness Neurologic: without any new focal findings Gait not checked due to patient safety concerns  Vitals:   06/19/20 1645 06/19/20 2045 06/20/20 0541 06/20/20 0857  BP: (!) 173/64 (!) 135/55 (!) 168/64 (!) 170/70  Pulse: 88 85 77 85  Resp: 18 18 18 18   Temp: 97.7 F (36.5 C) 97.9 F (36.6 C) 98.4 F (36.9 C) 98.4 F (36.9 C)  TempSrc:      SpO2: 96% 92% 92% 94%  Weight:      Height:        Intake/Output Summary (Last 24 hours) at 06/20/2020 1526 Last data filed at 06/20/2020 1042 Gross per 24 hour  Intake 990 ml  Output 1050 ml  Net -60 ml   Filed Weights   06/19/20 1034  Weight: 61.2 kg    Data Reviewed: I have personally reviewed and interpreted daily labs, tele strips, imagings as discussed  above. I reviewed all nursing notes, pharmacy notes, vitals, pertinent old records I have discussed plan of care as described above with RN and patient/family.  CBC: Recent Labs  Lab 06/19/20 1233 06/20/20 0600  WBC 19.3* 18.7*  NEUTROABS 15.9*  --   HGB 12.5 11.4*  HCT 37.9 34.4*  MCV 92.9 91.7  PLT 371 734   Basic Metabolic Panel: Recent Labs  Lab 06/19/20 1233 06/20/20 0600 06/20/20 0903  NA 139 140  --   K 3.9 3.6  --   CL 103 107  --   CO2 26 25  --   GLUCOSE 102* 99  --   BUN 21 14  --   CREATININE 0.90 0.67  --   CALCIUM 8.7* 7.9*  --   MG  --   --  1.9  PHOS  --   --  2.2*    Studies: No results found.  Scheduled Meds: . amiodarone  200 mg Oral BID  . apixaban  5 mg Oral BID  . ascorbic acid  500 mg Oral Daily  . atorvastatin  20 mg Oral Daily  . calcium-vitamin D  1 tablet Oral Daily  . Chlorhexidine Gluconate Cloth  6 each Topical  Daily  . docusate sodium  100 mg Oral BID  . famotidine  20 mg Oral Daily  . feeding supplement  237 mL Oral TID BM  . fluticasone  1 spray Each Nare Daily  . folic acid  1 mg Oral Daily  . gabapentin  100 mg Oral Daily  . loratadine  10 mg Oral Daily  . melatonin  5 mg Oral QHS  . metoprolol tartrate  12.5 mg Oral BID  . multivitamin with minerals  1 tablet Oral Daily  . nystatin   Topical BID  . pantoprazole (PROTONIX) IV  40 mg Intravenous Q24H  . phosphorus  500 mg Oral TID  . polyethylene glycol  17 g Oral Daily  . prochlorperazine  10 mg Intravenous Once  . senna-docusate  1 tablet Oral BID  . vitamin B-12  1,000 mcg Oral Daily   Continuous Infusions: . azithromycin    . cefTRIAXone (ROCEPHIN)  IV     PRN Meds: acetaminophen **OR** acetaminophen, hydrALAZINE, HYDROcodone-acetaminophen, ondansetron **OR** ondansetron (ZOFRAN) IV  Time spent: 35 minutes  Author: Val Riles. MD [facility] 06/20/2020 3:26 PM  To reach On-call, see care teams to locate the attending and reach out to them via  [email or web address]. If 7PM-7AM, please contact night-coverage If you still have difficulty reaching the attending provider, please page the [facility] (Director on Call) for [facility] on amion for assistance.

## 2020-06-20 NOTE — Progress Notes (Signed)
PT Cancellation Note  Patient Details Name: [name] MRN: [id number] DOB: 04-02-1933   Cancelled Treatment:    Reason Eval/Treat Not Completed: Other (comment).  Pt is currently sleeping in room and nursing requesting for pt to not be seen at this moment.  Will re-attempt at later date/time as medically appropriate.    Gwenlyn Saran, PT, DPT 06/20/20, 3:57 PM

## 2020-06-21 LAB — CBC
HCT: 35.9 % — ABNORMAL LOW (ref 36.0–46.0)
Hemoglobin: 12 g/dL (ref 12.0–15.0)
MCH: 30.2 pg (ref 26.0–34.0)
MCHC: 33.4 g/dL (ref 30.0–36.0)
MCV: 90.4 fL (ref 80.0–100.0)
Platelets: 357 10*3/uL (ref 150–400)
RBC: 3.97 MIL/uL (ref 3.87–5.11)
RDW: 14.3 % (ref 11.5–15.5)
WBC: 13.9 10*3/uL — ABNORMAL HIGH (ref 4.0–10.5)
nRBC: 0 % (ref 0.0–0.2)

## 2020-06-21 LAB — BASIC METABOLIC PANEL
Anion gap: 14 (ref 5–15)
BUN: 15 mg/dL (ref 8–23)
CO2: 28 mmol/L (ref 22–32)
Calcium: 8.2 mg/dL — ABNORMAL LOW (ref 8.9–10.3)
Chloride: 101 mmol/L (ref 98–111)
Creatinine, Ser: 0.77 mg/dL (ref 0.44–1.00)
GFR, Estimated: >60 mL/min (ref >60)
Glucose, Bld: 93 mg/dL (ref 70–99)
Potassium: 2.8 mmol/L — ABNORMAL LOW (ref 3.5–5.1)
Sodium: 143 mmol/L (ref 135–145)

## 2020-06-21 LAB — PHOSPHORUS: Phosphorus: 4 mg/dL (ref 2.5–4.6)

## 2020-06-21 LAB — MAGNESIUM: Magnesium: 1.9 mg/dL (ref 1.7–2.4)

## 2020-06-21 LAB — URINE CULTURE: Culture: 50000 — AB

## 2020-06-21 MED ORDER — POTASSIUM CHLORIDE 10 MEQ/100ML IV SOLN
10.0000 meq | INTRAVENOUS | Status: AC
  Administered 2020-06-21 (×4): 10 meq via INTRAVENOUS
  Filled 2020-06-21 (×4): qty 100

## 2020-06-21 MED ORDER — FLORANEX PO PACK
1.0000 g | PACK | Freq: Three times a day (TID) | ORAL | Status: DC
  Administered 2020-06-21 – 2020-06-24 (×8): 1 g via ORAL
  Filled 2020-06-21 (×11): qty 1

## 2020-06-21 MED ORDER — PROCHLORPERAZINE EDISYLATE 10 MG/2ML IJ SOLN
10.0000 mg | Freq: Once | INTRAMUSCULAR | Status: AC
  Administered 2020-06-21: 10 mg via INTRAVENOUS
  Filled 2020-06-21: qty 2

## 2020-06-21 MED ORDER — POTASSIUM CHLORIDE CRYS ER 20 MEQ PO TBCR
40.0000 meq | EXTENDED_RELEASE_TABLET | ORAL | Status: AC
  Administered 2020-06-21 (×2): 40 meq via ORAL
  Filled 2020-06-21 (×2): qty 2

## 2020-06-21 NOTE — Progress Notes (Signed)
[facility] Progress Note  Patient: [name]    MOQ:[id number]  DOA: 06/19/2020     Date of Service: the patient was seen and examined on 06/21/2020  Chief Complaint  Patient presents with  . Weakness  . Nausea   Brief hospital course:  [name] is a 87 y.o. female with medical history significant for Tiny P forhypertension, GERD, COPD, dyslipidemia, history of left breast cancer status post chemo and radiation therapywho presents to the emergency room via EMS from the skilled nursing facility where she was discharged to for subacute rehab following left hip pinning for an acute left  subcapital femoral neck fracture. Her husband who is at the bedside states that she has continued to decline since her discharge to the skilled nursing facility and has had poor oral intake, constipation and generalized weakness. Patient states that she has had persistent nausea but denies having any vomiting.  She has abdominal pain mostly in the suprapubic area which she rates a 5 to 6 x 10 in intensity at its worst.  She denies having any fever or chills.  She was discharged with an indwelling Foley catheter because she had difficulty voiding. She denies having any chest pain, no shortness of breath, no headache, no blurred vision, no cough, no diaphoresis, no palpitations. Labs show sodium 139, potassium 3.9, chloride 103, bicarb 26, glucose 102, BUN 21, creatinine 0.90, calcium 8.7, alkaline phosphatase 90, albumin 3.6, AST 18, ALT 22, total protein 6.8, white count 19.3, hemoglobin 12.5, hematocrit 37.9, MCV 92.9, RDW 14.5, platelet count 371 Respiratory viral panel is negative Patient has pyuria   ED Course: Patient is an 87 year old Caucasian female who was recently discharged to skilled nursing facility for subacute rehab following the repair of her left subcapital femoral neck fracture.  Per her husband she has continued to decline since her discharge to the skilled nursing facility and  presents today for evaluation of poor oral intake and suprapubic pain.  She has an indwelling Foley catheter and pyuria.  She will be admitted to the hospital for further evaluation.  Assessment and Plan:   # UTI in a patient with an indwelling Foley catheter Patient has pyuria and marked leukocytosis Continue ceftriaxone 2 g IV daily  Follow urine culture Patient has indwelling Foley catheter which will be removed when patient is ambulatory Foley catheter was changed in the ED US renal: Unremarkable sonographic appearance of the bilateral kidneys.   # Possible community-acquired pneumonia CXR Chronic lung changes. Acute edema or infectious process superimposed on chronicchanges. started azithromycin 500 mg IV daily Trend WBC count  # Hypokalemia, potassium repleted.  Monitor and replete as needed.  # Diarrhea could be secondary to IV antibiotics Continue oral hydration Started probiotics   # Persistent nausea/constipation Probably related to opioid therapy that the patient is on following repair of her left hip fracture. Supportive care with antiemetics Decrease frequency of administration of opioids Continue scheduled bowel regimen S/p Compazine 10 mg IV given yesterday, patient felt improvement so dose was repeated today as well   # Hypophosphatemia secondary to poor nutrition, started oral supplement. Started Ensure supplement  # Iron deficiency, iron saturation 10%, started oral iron supplement with vitamin C.   # Paroxysmal atrial fibrillation Continue metoprolol and amiodarone for rate control Continue Eliquis as primary prophylaxis for an acute stroke Cardiology recommended call if needed acute intervention otherwise patient needs to reschedule appointment as an outpatient    # Hypertension, continue metoprolol 12.5 p.o. twice  daily IV hydralazine as needed Blood pressure seems to be elevated secondary to discomfort   # GERD Place patient on  Protonix   # Dyslipidemia Continue fish oil and atorvastatin    # Status post left hip fracture Continue physical therapy Orthopedic consulted as patient was scheduled for postop check and staple removal   Body mass index is 22.47 kg/m.   Interventions:    Diet: Heart healthy DVT Prophylaxis: Therapeutic Anticoagulation with Eliquis   Advance goals of care discussion: Full code  Family Communication: family was present at bedside, at the time of interview.  The pt provided permission to discuss medical plan with the family. Opportunity was given to ask question and all questions were answered satisfactorily.   Disposition:  Pt is from SNF, admitted with AKI and possible PNA, still has leukocytosis, urine culture pending, on IV antibiotics, which precludes a safe discharge. Discharge to SNF when medically stable, may need 1-2 days more to recover.  Subjective: No acute overnight issues, patient still has mild nausea, improved after Compazine given last night,.  And feels a little bit improvement, she was able to sit in the recliner, patient started having loose stools yesterday.  Patient had good sleep yesterday anti-hepatitis is also improving.  Physical Exam: General:  alert oriented to time, place, and person.  Appear in moderate distress, affect depressed Eyes: PERRLA ENT: Oral Mucosa Clear, moist  Neck: no JVD,  Cardiovascular: S1 and S2 Present, no Murmur,  Respiratory: good respiratory effort, Bilateral Air entry equal and Decreased, mild  Crackles, no wheezes Abdomen: Bowel Sound present, Soft and no tenderness,  Skin: no rashes Extremities: no Pedal edema, no calf tenderness Neurologic: without any new focal findings Gait not checked due to patient safety concerns  Vitals:   06/20/20 1941 06/21/20 0530 06/21/20 0823 06/21/20 1151  BP: (!) 157/64 (!) 142/54 (!) 151/60 (!) 128/50  Pulse: 81 72 70 73  Resp: 16 16 15 15   Temp: 98.2 F (36.8 C) 98.4 F (36.9  C) 98.1 F (36.7 C) 97.8 F (36.6 C)  TempSrc: Oral Oral    SpO2: 90% 93% 93% 92%  Weight:      Height:        Intake/Output Summary (Last 24 hours) at 06/21/2020 1616 Last data filed at 06/21/2020 0534 Gross per 24 hour  Intake 350 ml  Output 475 ml  Net -125 ml   Filed Weights   06/19/20 1034  Weight: 61.2 kg    Data Reviewed: I have personally reviewed and interpreted daily labs, tele strips, imagings as discussed above. I reviewed all nursing notes, pharmacy notes, vitals, pertinent old records I have discussed plan of care as described above with RN and patient/family.  CBC: Recent Labs  Lab 06/19/20 1233 06/20/20 0600 06/21/20 0636  WBC 19.3* 18.7* 13.9*  NEUTROABS 15.9*  --   --   HGB 12.5 11.4* 12.0  HCT 37.9 34.4* 35.9*  MCV 92.9 91.7 90.4  PLT 371 374 297   Basic Metabolic Panel: Recent Labs  Lab 06/19/20 1233 06/20/20 0600 06/20/20 0903 06/21/20 0636  NA 139 140  --  143  K 3.9 3.6  --  2.8*  CL 103 107  --  101  CO2 26 25  --  28  GLUCOSE 102* 99  --  93  BUN 21 14  --  15  CREATININE 0.90 0.67  --  0.77  CALCIUM 8.7* 7.9*  --  8.2*  MG  --   --  1.9 1.9  PHOS  --   --  2.2* 4.0    Studies: No results found.  Scheduled Meds: . amiodarone  200 mg Oral BID  . apixaban  5 mg Oral BID  . ascorbic acid  500 mg Oral Daily  . atorvastatin  20 mg Oral Daily  . calcium-vitamin D  1 tablet Oral Daily  . Chlorhexidine Gluconate Cloth  6 each Topical Daily  . docusate sodium  100 mg Oral BID  . famotidine  20 mg Oral Daily  . feeding supplement  237 mL Oral TID BM  . fluticasone  1 spray Each Nare Daily  . folic acid  1 mg Oral Daily  . gabapentin  100 mg Oral Daily  . iron polysaccharides  150 mg Oral Daily  . lactobacillus  1 g Oral TID WC  . loratadine  10 mg Oral Daily  . melatonin  5 mg Oral QHS  . metoprolol tartrate  12.5 mg Oral BID  . multivitamin with minerals  1 tablet Oral Daily  . nystatin   Topical BID  . pantoprazole  (PROTONIX) IV  40 mg Intravenous Q24H  . polyethylene glycol  17 g Oral Daily  . potassium chloride  40 mEq Oral Q4H  . senna-docusate  1 tablet Oral BID   Continuous Infusions: . azithromycin 500 mg (06/20/20 2043)  . cefTRIAXone (ROCEPHIN)  IV 2 g (06/21/20 1525)   PRN Meds: acetaminophen **OR** acetaminophen, hydrALAZINE, HYDROcodone-acetaminophen, ondansetron **OR** ondansetron (ZOFRAN) IV  Time spent: 35 minutes  Author: Val Riles. MD [facility] 06/21/2020 4:16 PM  To reach On-call, see care teams to locate the attending and reach out to them via [email or web address]. If 7PM-7AM, please contact night-coverage If you still have difficulty reaching the attending provider, please page the [facility] (Director on Call) for [facility] on amion for assistance.

## 2020-06-21 NOTE — TOC Transition Note (Signed)
Transition of Care [facility]) - CM/SW Discharge Note   Patient Details  Name: [name] MRN: [id number] Date of Birth: 20-Sep-1933  Transition of Care [facility]) CM/SW Contact:  Harriet Masson, RN Phone [phone] 06/21/2020, 4:08 PM   Clinical Narrative:    Recommendations for HHPT. Spoke with the pt's spouse [name]. Pt came from [facility] however refused to go back to the SNF. PT evaluated and again recommends HHPT. Discussed with spouse and called [facility] spoke with Helene Kelp who approved for services however services will not be able to start for 7 days. Pt also had DME delivered to her room via [facility]). This deliver has been confirmed by the spouse in the pt's room.   Pt uses [facility] in [place] for pharmacy needs and transportation needs confirmed as pt lives with her spouse in a townhouse. No other request or issues to address at this time.  TOC remains available for any ongoing needs.   Final next level of care: [place] Barriers to Discharge: Continued Medical Work up   Patient Goals and CMS Choice        Discharge Placement                       Discharge Plan and Services                DME Arranged: Gilford Rile DME Agency: Other - Comment [facility] Aver) Date DME Agency Contacted: 06/21/20 Time DME Agency Contacted: [phone]   [place]: [facility] (formerly [employer]) Date [place]: 06/21/20 Time [place]: 1300 Representative spoke with at [place]: [place] ([place]) Interventions     Readmission Risk Interventions No flowsheet data found.

## 2020-06-21 NOTE — Evaluation (Addendum)
Physical Therapy Evaluation Patient Details Name: [name] MRN: [id number] DOB: 1933/09/14 Today's Date: 06/21/2020   History of Present Illness  presented to ER from STR secondary to decreased PO intake, progressive weakness, abdominal pain; admitted for management of UTI. Of note, s/p L hip pinning 06/05/20, WBAT.  Clinical Impression  Patient resting in bed upon arrival to room; husband present and supportive at bedside.  Patient alert and oriented to all information; follows commands, agreeable to participation with session as tolerated (does endorse persistent nausea; meds received prior to session).  Denies pain at this time. Bilat UE/LE strength and ROM grossly WFL for basic transfers and mobility, except L shoulder ROM limited since fall (scheduled for outpatient follow up), L hip ROM limited s/p recent surgery (cannulated pinning 06/05/20, WBAT).  Currently requiring min assist for bed mobility; cga/min assist for sit/stand, basic transfers and gait (20') with RW.  Demonstrates fair step height/length with reciprocal stepping pattern; generally guarded cadence and overall gait speed, but no overt buckling or LOB.  Good position/management of RW; do recommend continued use at this time. Additional distance limited by nausea, general malaise Would benefit from skilled PT to address above deficits and promote optimal return to PLOF.; Recommend transition to HHPT upon discharge from acute hospitalization.  Of note, patient/husband report scheduled outpatient follow up for staple removal next date; questioning if this can be done in house.  Secure chat sent to attending physician for review and consideration.     Follow Up Recommendations Home health PT    Equipment Recommendations  Rolling walker with 5" wheels    Recommendations for Other Services       Precautions / Restrictions Precautions Precautions: Fall Restrictions Weight Bearing Restrictions: Yes LLE Weight Bearing: Weight  bearing as tolerated      Mobility  Bed Mobility Overal bed mobility: Needs Assistance Bed Mobility: Supine to Sit     Supine to sit: Min assist     General bed mobility comments: slow and guarded; intermittent rest breaks throughout movement transition    Transfers Overall transfer level: Needs assistance Equipment used: Rolling walker (2 wheeled) Transfers: Sit to/from Stand Sit to Stand: Min guard;Min assist         General transfer comment: cuing for hand placement  Ambulation/Gait Ambulation/Gait assistance: Min guard;Min assist Gait Distance (Feet): 20 Feet Assistive device: Rolling walker (2 wheeled)       General Gait Details: fair step height/length with reciprocal stepping pattern; generally guarded cadence and overall gait speed, but no overt buckling or LOB.  Good position/management of RW; do recommend continued use at this time. Additional distance limited by nausea, general malaise  Stairs            Wheelchair Mobility    Modified Rankin (Stroke Patients Only)       Balance Overall balance assessment: Needs assistance Sitting-balance support: Feet supported;No upper extremity supported Sitting balance-Leahy Scale: Good     Standing balance support: Bilateral upper extremity supported Standing balance-Leahy Scale: Fair                               Pertinent Vitals/Pain Pain Assessment: No/denies pain    Home Living Family/patient expects to be discharged to:: Private residence Living Arrangements: Spouse/significant other Available Help at Discharge: Family;Available 24 hours/day Type of Home: House Home Access: Stairs to enter Entrance Stairs-Rails: Right;Left;Can reach both Entrance Stairs-Number of Steps: 4-5 Home Layout: Able to  live on main level with bedroom/bathroom Home Equipment: Grab bars - toilet;Cane - single point;Other (comment);Shower seat      Prior Function Level of Independence: Needs assistance    Gait / Transfers Assistance Needed: Pateint repors she has been sleeping in a recliner for the last 4 months due to difficulty getting in/out of bed. States prior to 2 weeks ago she ambulated in the home with no AD and went on walks in her neighborhood with hiking stick.  ADL's / Homemaking Assistance Needed: independent prior to hospitalization. Assised by husband as needed.  Comments: Most recently at [facility], ambulating at least household distances with RW; voices preference to return home at discharge from this hospital stay     Hand Dominance        Extremity/Trunk Assessment   Upper Extremity Assessment Upper Extremity Assessment: Generalized weakness (L shoulder pain, limited ROM since fall 06/05/20 (scheduled for outpatient follow up))    Lower Extremity Assessment Lower Extremity Assessment: Generalized weakness (grossly at least 3+ to 4-/5 throughout)       Communication   Communication: No difficulties  Cognition Arousal/Alertness: Awake/alert Behavior During Therapy: WFL for tasks assessed/performed Overall Cognitive Status: Within Functional Limits for tasks assessed                                        General Comments      Exercises     Assessment/Plan    PT Assessment Patient needs continued PT services  PT Problem List Decreased strength;Decreased coordination;Pain;Cardiopulmonary status limiting activity;Decreased range of motion;Decreased activity tolerance;Decreased knowledge of use of DME;Decreased balance;Decreased safety awareness;Decreased mobility;Decreased knowledge of precautions;Decreased skin integrity       PT Treatment Interventions DME instruction;Balance training;Gait training;Neuromuscular re-education;Stair training;Functional mobility training;Patient/family education;Therapeutic activities;Therapeutic exercise;Manual techniques    PT Goals (Current goals can be found in the Care Plan section)  Acute Rehab PT  Goals Patient Stated Goal: to get better PT Goal Formulation: With patient/family Time For Goal Achievement: 07/05/20 Potential to Achieve Goals: Good    Frequency 7X/week   Barriers to discharge        Co-evaluation               AM-PAC PT "6 Clicks" Mobility  Outcome Measure Help needed turning from your back to your side while in a flat bed without using bedrails?: None Help needed moving from lying on your back to sitting on the side of a flat bed without using bedrails?: A Little Help needed moving to and from a bed to a chair (including a wheelchair)?: A Little Help needed standing up from a chair using your arms (e.g., wheelchair or bedside chair)?: A Little Help needed to walk in hospital room?: A Little Help needed climbing 3-5 steps with a railing? : A Little 6 Click Score: 19    End of Session Equipment Utilized During Treatment: Gait belt Activity Tolerance: Patient tolerated treatment well;Patient limited by fatigue Patient left: with chair alarm set;with call bell/phone within reach;with family/visitor present;in chair Nurse Communication: Mobility status PT Visit Diagnosis: Unsteadiness on feet (R26.81);History of falling (Z91.81);Muscle weakness (generalized) (M62.81);Difficulty in walking, not elsewhere classified (R26.2)    Time: 1035-1050 PT Time Calculation (min) (ACUTE ONLY): 15 min   Charges:   PT Evaluation $PT Eval Moderate Complexity: 1 Mod          Noella Kipnis H. Owens Shark, PT, DPT, NCS 06/21/20, 11:03  AM [phone]

## 2020-06-22 ENCOUNTER — Ambulatory Visit: Payer: Medicare PPO | Admitting: Medical

## 2020-06-22 LAB — CBC
HCT: 36.5 % (ref 36.0–46.0)
Hemoglobin: 12.3 g/dL (ref 12.0–15.0)
MCH: 30.7 pg (ref 26.0–34.0)
MCHC: 33.7 g/dL (ref 30.0–36.0)
MCV: 91 fL (ref 80.0–100.0)
Platelets: 378 10*3/uL (ref 150–400)
RBC: 4.01 MIL/uL (ref 3.87–5.11)
RDW: 14.6 % (ref 11.5–15.5)
WBC: 17.2 10*3/uL — ABNORMAL HIGH (ref 4.0–10.5)
nRBC: 0 % (ref 0.0–0.2)

## 2020-06-22 LAB — BASIC METABOLIC PANEL
Anion gap: 7 (ref 5–15)
BUN: 17 mg/dL (ref 8–23)
CO2: 27 mmol/L (ref 22–32)
Calcium: 8.9 mg/dL (ref 8.9–10.3)
Chloride: 107 mmol/L (ref 98–111)
Creatinine, Ser: 0.74 mg/dL (ref 0.44–1.00)
GFR, Estimated: >60 mL/min (ref >60)
Glucose, Bld: 132 mg/dL — ABNORMAL HIGH (ref 70–99)
Potassium: 4.6 mmol/L (ref 3.5–5.1)
Sodium: 141 mmol/L (ref 135–145)

## 2020-06-22 LAB — MAGNESIUM: Magnesium: 1.9 mg/dL (ref 1.7–2.4)

## 2020-06-22 LAB — PHOSPHORUS: Phosphorus: 2.3 mg/dL — ABNORMAL LOW (ref 2.5–4.6)

## 2020-06-22 MED ORDER — TRAZODONE HCL 50 MG PO TABS
25.0000 mg | ORAL_TABLET | Freq: Every day | ORAL | Status: AC
  Administered 2020-06-22: 25 mg via ORAL
  Filled 2020-06-22: qty 1

## 2020-06-22 MED ORDER — METOCLOPRAMIDE HCL 5 MG/ML IJ SOLN
5.0000 mg | Freq: Four times a day (QID) | INTRAMUSCULAR | Status: AC
  Administered 2020-06-22 (×2): 5 mg via INTRAVENOUS
  Filled 2020-06-22 (×2): qty 2

## 2020-06-22 MED ORDER — TAMSULOSIN HCL 0.4 MG PO CAPS
0.4000 mg | ORAL_CAPSULE | Freq: Every day | ORAL | Status: DC
  Administered 2020-06-22 – 2020-06-24 (×3): 0.4 mg via ORAL
  Filled 2020-06-22 (×3): qty 1

## 2020-06-22 NOTE — Progress Notes (Signed)
Patient is just over 2 weeks out from a left hip pinning.  As part of her abdominal x-ray and AP pelvis shows the left hip appears to be in good position along with hardware.  Staples removed today.  We will want her to follow-up in about a month for a follow-up x-ray in the office and discussed it with her husband.  Continue with physical therapy weightbearing as tolerated left hip.

## 2020-06-22 NOTE — Care Management Important Message (Signed)
Important Message  Patient Details  Name: [name] MRN: [id number] Date of Birth: [birth year]   Medicare Important Message Given:  Yes     Juliann Pulse A Dajsha Massaro 06/22/2020, 2:38 PM

## 2020-06-22 NOTE — Plan of Care (Signed)
  Problem: Education: Goal: Knowledge of General Education information will improve Description: Including pain rating scale, medication(s)/side effects and non-pharmacologic comfort measures Outcome: Progressing   Problem: Clinical Measurements: Goal: Will remain free from infection Outcome: Progressing   Problem: Clinical Measurements: Goal: Diagnostic test results will improve Outcome: Progressing   Problem: Activity: Goal: Risk for activity intolerance will decrease Outcome: Progressing   Problem: Nutrition: Goal: Adequate nutrition will be maintained Outcome: Progressing   Problem: Coping: Goal: Level of anxiety will decrease Outcome: Progressing   Problem: Elimination: Goal: Will not experience complications related to bowel motility Outcome: Progressing   Problem: Pain Managment: Goal: General experience of comfort will improve Outcome: Progressing   Problem: Safety: Goal: Ability to remain free from injury will improve Outcome: Progressing   Problem: Skin Integrity: Goal: Risk for impaired skin integrity will decrease Outcome: Progressing

## 2020-06-22 NOTE — Progress Notes (Signed)
Physical Therapy Treatment Patient Details Name: [name] MRN: [id number] DOB: 10/23/1933 Today's Date: 06/22/2020    History of Present Illness presented to ER from STR secondary to decreased PO intake, progressive weakness, abdominal pain; admitted for management of UTI. Of note, s/p L hip pinning 06/05/20, WBAT.    PT Comments    Progressive increase in gait distance (50' x2) but remains generally limited by fatigue, generalized weakness/nausea. RN informed/aware and meds requested. Will plan to trial stairs next session and encourage hands-on training with husband in prep for discharge home.     Follow Up Recommendations  Home health PT     Equipment Recommendations  Rolling walker with 5" wheels    Recommendations for Other Services       Precautions / Restrictions Precautions Precautions: Fall Restrictions Weight Bearing Restrictions: Yes LLE Weight Bearing: Weight bearing as tolerated Other Position/Activity Restrictions: L shoulder pain    Mobility  Bed Mobility Overal bed mobility: Needs Assistance Bed Mobility: Supine to Sit     Supine to sit: Min assist          Transfers Overall transfer level: Needs assistance Equipment used: Rolling walker (2 wheeled) Transfers: Sit to/from Stand Sit to Stand: Min guard;Min assist         General transfer comment: cuing for hand placement  Ambulation/Gait Ambulation/Gait assistance: Min guard;+2 safety/equipment Gait Distance (Feet):  (50' x2) Assistive device: Rolling walker (2 wheeled)       General Gait Details: reciprocal stepping pattern, mildly antalgic with decreased stance time L LE; slow and deliberate, but no buckling or LOB.  Fair awareness of need for activity pacing and intermittent rest periods, self-initiating as needed.   Stairs             Wheelchair Mobility    Modified Rankin (Stroke Patients Only)       Balance Overall balance assessment: Needs  assistance Sitting-balance support: No upper extremity supported;Feet supported Sitting balance-Leahy Scale: Good     Standing balance support: Bilateral upper extremity supported Standing balance-Leahy Scale: Fair                              Cognition Arousal/Alertness: Awake/alert Behavior During Therapy: WFL for tasks assessed/performed Overall Cognitive Status: Within Functional Limits for tasks assessed                                        Exercises Other Exercises Other Exercises: Seated LE therex, 1x10, active ROM for muscular strength and flexibility-ankle pumps, LAQs, marching.  Issued handout with written and pictorial descriptions of both seated and supien therex for use as HEP outside of therapy.  Patient/husband voiced understanding.    General Comments        Pertinent Vitals/Pain Pain Assessment: 0-10 Pain Score: 3  Pain Location: L anterior shoulder Pain Descriptors / Indicators: Sore Pain Intervention(s): Limited activity within patient's tolerance;Monitored during session;Repositioned    Home Living                      Prior Function            PT Goals (current goals can now be found in the care plan section) Acute Rehab PT Goals Patient Stated Goal: to get better PT Goal Formulation: With patient/family Time For Goal Achievement: 07/05/20 Potential to Achieve  Goals: Good Progress towards PT goals: Progressing toward goals    Frequency    7X/week      PT Plan Current plan remains appropriate    Co-evaluation              AM-PAC PT "6 Clicks" Mobility   Outcome Measure  Help needed turning from your back to your side while in a flat bed without using bedrails?: None Help needed moving from lying on your back to sitting on the side of a flat bed without using bedrails?: A Little Help needed moving to and from a bed to a chair (including a wheelchair)?: A Little Help needed standing up from a  chair using your arms (e.g., wheelchair or bedside chair)?: A Little Help needed to walk in hospital room?: A Little Help needed climbing 3-5 steps with a railing? : A Little 6 Click Score: 19    End of Session Equipment Utilized During Treatment: Gait belt Activity Tolerance: Patient tolerated treatment well;Patient limited by fatigue Patient left: with chair alarm set;with call bell/phone within reach;with family/visitor present;in chair Nurse Communication: Mobility status PT Visit Diagnosis: Unsteadiness on feet (R26.81);History of falling (Z91.81);Muscle weakness (generalized) (M62.81);Difficulty in walking, not elsewhere classified (R26.2)     Time: 1030-1314 PT Time Calculation (min) (ACUTE ONLY): 31 min  Charges:  $Therapeutic Exercise: 8-22 mins $Therapeutic Activity: 8-22 mins                    [place] H. Owens Shark, PT, DPT, NCS 06/22/20, 2:02 PM [phone]

## 2020-06-22 NOTE — Progress Notes (Signed)
[facility] Progress Note  Patient: [name]    JQB:[id number]  DOA: 06/19/2020     Date of Service: the patient was seen and examined on 06/22/2020  Chief Complaint  Patient presents with  . Weakness  . Nausea   Brief hospital course:  [name] is a 87 y.o. female with medical history significant for [name], GERD, COPD, dyslipidemia, history of left breast cancer status post chemo and radiation therapywho presents to the emergency room via EMS from the skilled nursing facility where she was discharged to for subacute rehab following left hip pinning for an acute left  subcapital femoral neck fracture. Her husband who is at the bedside states that she has continued to decline since her discharge to the skilled nursing facility and has had poor oral intake, constipation and generalized weakness. Patient states that she has had persistent nausea but denies having any vomiting.  She has abdominal pain mostly in the suprapubic area which she rates a 5 to 6 x 10 in intensity at its worst.  She denies having any fever or chills.  She was discharged with an indwelling Foley catheter because she had difficulty voiding. She denies having any chest pain, no shortness of breath, no headache, no blurred vision, no cough, no diaphoresis, no palpitations. Labs show sodium 139, potassium 3.9, chloride 103, bicarb 26, glucose 102, BUN 21, creatinine 0.90, calcium 8.7, alkaline phosphatase 90, albumin 3.6, AST 18, ALT 22, total protein 6.8, white count 19.3, hemoglobin 12.5, hematocrit 37.9, MCV 92.9, RDW 14.5, platelet count 371 Respiratory viral panel is negative Patient has pyuria   ED Course: Patient is an 87 year old Caucasian female who was recently discharged to skilled nursing facility for subacute rehab following the repair of her left subcapital femoral neck fracture.  Per her husband she has continued to decline since her discharge to the skilled nursing facility and  presents today for evaluation of poor oral intake and suprapubic pain.  She has an indwelling Foley catheter and pyuria.  She will be admitted to the hospital for further evaluation.  Assessment and Plan:   # UTI in a patient with an indwelling Foley catheter Patient has pyuria and marked leukocytosis Continue ceftriaxone 2 g IV daily  Follow urine culture Patient has indwelling Foley catheter which will be removed when patient is ambulatory Foley catheter was changed in the ED US renal: Unremarkable sonographic appearance of the bilateral kidneys. 4/18 DC Foley catheter, follow voiding trial.  Started Flomax    # Possible community-acquired pneumonia CXR Chronic lung changes. Acute edema or infectious process superimposed on chronicchanges. started azithromycin 500 mg IV daily Trend WBC count  # Hypokalemia, potassium repleted.  Monitor and replete as needed.  # Diarrhea could be secondary to IV antibiotics Continue oral hydration Started probiotics   # Persistent nausea/constipation Probably related to opioid therapy that the patient is on following repair of her left hip fracture. Supportive care with antiemetics Decrease frequency of administration of opioids Continue scheduled bowel regimen S/p Compazine 10 mg IV given yesterday, patient felt improvement so dose was repeated today as well 4/18 started Reglan 5 mg IV x2 doses today  # Hypophosphatemia secondary to poor nutrition, started oral supplement. Started Ensure supplement  # Iron deficiency, iron saturation 10%, started oral iron supplement with vitamin C.   # Paroxysmal atrial fibrillation Continue metoprolol and amiodarone for rate control Continue Eliquis as primary prophylaxis for an acute stroke Cardiology recommended call if needed acute intervention  otherwise patient needs to reschedule appointment as an outpatient    # Hypertension, continue metoprolol 12.5 p.o. twice daily IV hydralazine as  needed Blood pressure seems to be elevated secondary to discomfort   # GERD Place patient on Protonix   # Dyslipidemia Continue fish oil and atorvastatin    # Status post left hip fracture Continue physical therapy Orthopedic consulted as patient was scheduled for postop check and staple removal   Body mass index is 22.47 kg/m.   Interventions:    Diet: Heart healthy DVT Prophylaxis: Therapeutic Anticoagulation with Eliquis   Advance goals of care discussion: Full code  Family Communication: family was present at bedside, at the time of interview.  The pt provided permission to discuss medical plan with the family. Opportunity was given to ask question and all questions were answered satisfactorily.   Disposition:  Pt is from SNF, admitted with AKI and possible PNA, still has leukocytosis,  on IV antibiotics, which precludes a safe discharge. Discharge to SNF vs HHPT when medically stable, may need 1-2 days more to recover.  Subjective: No acute overnight issues, patient's nausea is improving but still feels stomach is full, lack of appetite, decreased p.o. intake.  Diet improved but is still not adequate.  Patient did not have good sleep last night.  Had diarrhea once episode today morning and 3 episodes yesterday. Patient denied any chest pain or shortness of breath, no palpitations  Physical Exam: General:  alert oriented to time, place, and person.  Appear in moderate distress, affect depressed Eyes: PERRLA ENT: Oral Mucosa Clear, moist  Neck: no JVD,  Cardiovascular: S1 and S2 Present, no Murmur,  Respiratory: good respiratory effort, Bilateral Air entry equal and Decreased, mild  Crackles, no wheezes Abdomen: Bowel Sound present, Soft and no tenderness,  Skin: no rashes Extremities: no Pedal edema, no calf tenderness Neurologic: without any new focal findings Gait not checked due to patient safety concerns  Vitals:   06/21/20 1922 06/22/20 0358 06/22/20  0835 06/22/20 1513  BP: (!) 147/50 (!) 147/58 (!) 149/62 (!) 143/56  Pulse: 71 71 70 93  Resp: 18 15 18 18   Temp: 97.9 F (36.6 C) 97.7 F (36.5 C) 98.3 F (36.8 C) 98.4 F (36.9 C)  TempSrc: Oral Oral Oral Oral  SpO2: 91% 92% 94% 93%  Weight:      Height:        Intake/Output Summary (Last 24 hours) at 06/22/2020 1623 Last data filed at 06/22/2020 1500 Gross per 24 hour  Intake 460 ml  Output 750 ml  Net -290 ml   Filed Weights   06/19/20 1034  Weight: 61.2 kg    Data Reviewed: I have personally reviewed and interpreted daily labs, tele strips, imagings as discussed above. I reviewed all nursing notes, pharmacy notes, vitals, pertinent old records I have discussed plan of care as described above with RN and patient/family.  CBC: Recent Labs  Lab 06/19/20 1233 06/20/20 0600 06/21/20 0636 06/22/20 0405  WBC 19.3* 18.7* 13.9* 17.2*  NEUTROABS 15.9*  --   --   --   HGB 12.5 11.4* 12.0 12.3  HCT 37.9 34.4* 35.9* 36.5  MCV 92.9 91.7 90.4 91.0  PLT 371 374 357 229   Basic Metabolic Panel: Recent Labs  Lab 06/19/20 1233 06/20/20 0600 06/20/20 0903 06/21/20 0636 06/22/20 0405  NA 139 140  --  143 141  K 3.9 3.6  --  2.8* 4.6  CL 103 107  --  101 107  CO2 26 25  --  28 27  GLUCOSE 102* 99  --  93 132*  BUN 21 14  --  15 17  CREATININE 0.90 0.67  --  0.77 0.74  CALCIUM 8.7* 7.9*  --  8.2* 8.9  MG  --   --  1.9 1.9 1.9  PHOS  --   --  2.2* 4.0 2.3*    Studies: No results found.  Scheduled Meds: . amiodarone  200 mg Oral BID  . apixaban  5 mg Oral BID  . ascorbic acid  500 mg Oral Daily  . atorvastatin  20 mg Oral Daily  . calcium-vitamin D  1 tablet Oral Daily  . Chlorhexidine Gluconate Cloth  6 each Topical Daily  . docusate sodium  100 mg Oral BID  . famotidine  20 mg Oral Daily  . feeding supplement  237 mL Oral TID BM  . fluticasone  1 spray Each Nare Daily  . folic acid  1 mg Oral Daily  . gabapentin  100 mg Oral Daily  . iron polysaccharides   150 mg Oral Daily  . lactobacillus  1 g Oral TID WC  . loratadine  10 mg Oral Daily  . melatonin  5 mg Oral QHS  . metoCLOPramide (REGLAN) injection  5 mg Intravenous Q6H  . metoprolol tartrate  12.5 mg Oral BID  . multivitamin with minerals  1 tablet Oral Daily  . nystatin   Topical BID  . pantoprazole (PROTONIX) IV  40 mg Intravenous Q24H  . polyethylene glycol  17 g Oral Daily  . senna-docusate  1 tablet Oral BID  . tamsulosin  0.4 mg Oral Daily  . traZODone  25 mg Oral QHS   Continuous Infusions: . azithromycin 500 mg (06/21/20 1710)  . cefTRIAXone (ROCEPHIN)  IV 2 g (06/22/20 1347)   PRN Meds: acetaminophen **OR** acetaminophen, hydrALAZINE, HYDROcodone-acetaminophen, ondansetron **OR** ondansetron (ZOFRAN) IV  Time spent: 35 minutes  Author: Val Riles. MD [facility] 06/22/2020 4:23 PM  To reach On-call, see care teams to locate the attending and reach out to them via [email or web address]. If 7PM-7AM, please contact night-coverage If you still have difficulty reaching the attending provider, please page the [facility] (Director on Call) for [facility] on amion for assistance.

## 2020-06-23 ENCOUNTER — Encounter: Payer: Medicare PPO | Admitting: Physical Therapy

## 2020-06-23 LAB — CBC
HCT: 34.4 % — ABNORMAL LOW (ref 36.0–46.0)
Hemoglobin: 11.4 g/dL — ABNORMAL LOW (ref 12.0–15.0)
MCH: 30.5 pg (ref 26.0–34.0)
MCHC: 33.1 g/dL (ref 30.0–36.0)
MCV: 92 fL (ref 80.0–100.0)
Platelets: 322 10*3/uL (ref 150–400)
RBC: 3.74 MIL/uL — ABNORMAL LOW (ref 3.87–5.11)
RDW: 14.7 % (ref 11.5–15.5)
WBC: 14.1 10*3/uL — ABNORMAL HIGH (ref 4.0–10.5)
nRBC: 0 % (ref 0.0–0.2)

## 2020-06-23 LAB — BASIC METABOLIC PANEL
Anion gap: 9 (ref 5–15)
BUN: 23 mg/dL (ref 8–23)
CO2: 27 mmol/L (ref 22–32)
Calcium: 8.8 mg/dL — ABNORMAL LOW (ref 8.9–10.3)
Chloride: 107 mmol/L (ref 98–111)
Creatinine, Ser: 0.62 mg/dL (ref 0.44–1.00)
GFR, Estimated: >60 mL/min (ref >60)
Glucose, Bld: 107 mg/dL — ABNORMAL HIGH (ref 70–99)
Potassium: 4.2 mmol/L (ref 3.5–5.1)
Sodium: 143 mmol/L (ref 135–145)

## 2020-06-23 LAB — MAGNESIUM: Magnesium: 2.1 mg/dL (ref 1.7–2.4)

## 2020-06-23 LAB — PHOSPHORUS: Phosphorus: 2.8 mg/dL (ref 2.5–4.6)

## 2020-06-23 MED ORDER — PANTOPRAZOLE SODIUM 40 MG PO TBEC
40.0000 mg | DELAYED_RELEASE_TABLET | Freq: Every day | ORAL | Status: DC
  Administered 2020-06-23 – 2020-06-24 (×2): 40 mg via ORAL
  Filled 2020-06-23 (×2): qty 1

## 2020-06-23 MED ORDER — CEPHALEXIN 500 MG PO CAPS
500.0000 mg | ORAL_CAPSULE | Freq: Four times a day (QID) | ORAL | Status: DC
  Administered 2020-06-23: 500 mg via ORAL
  Filled 2020-06-23: qty 1

## 2020-06-23 NOTE — Progress Notes (Signed)
Pt with multiple complaints about nursing/doctors/[facility]/ food department/  Did actively listen and tried to resolve all problems. I had them yesterday and listened to multiple complaints and husband curse out the doctors office on the phone while I was present.

## 2020-06-23 NOTE — Progress Notes (Signed)
[name] at [place] NAME: [name]    MR#:  [id number]  DATE OF BIRTH:  09-19-1933  SUBJECTIVE:  having a much better day today. Had bowel movement. Husband at bedside. Appetite picking up. Work with physical therapy.  REVIEW OF SYSTEMS:   Review of Systems  Constitutional: Negative for chills, fever and weight loss.  HENT: Negative for ear discharge, ear pain and nosebleeds.   Eyes: Negative for blurred vision, pain and discharge.  Respiratory: Negative for sputum production, shortness of breath, wheezing and stridor.   Cardiovascular: Negative for chest pain, palpitations, orthopnea and PND.  Gastrointestinal: Negative for abdominal pain, diarrhea, nausea and vomiting.  Genitourinary: Negative for frequency and urgency.  Musculoskeletal: Negative for back pain and joint pain.  Neurological: Negative for sensory change, speech change, focal weakness and weakness.  Psychiatric/Behavioral: Negative for depression and hallucinations. The patient is not nervous/anxious.    Tolerating Diet:yes Tolerating PT: HHPT  DRUG ALLERGIES:  No Known Allergies  VITALS:  Blood pressure (!) 126/51, pulse 70, temperature 98.2 F (36.8 C), resp. rate 16, height 5\' 5"  (1.651 m), weight 61.2 kg, SpO2 95 %.  PHYSICAL EXAMINATION:   Physical Exam  GENERAL:  87 y.o.-year-old patient lying in the bed with no acute distress.  LUNGS: Normal breath sounds bilaterally, no wheezing, rales, rhonchi. No use of accessory muscles of respiration.  CARDIOVASCULAR: S1, S2 normal. No murmurs, rubs, or gallops.  ABDOMEN: Soft, nontender, nondistended. Bowel sounds present. No organomegaly or mass.  EXTREMITIES: No cyanosis, clubbing or edema b/l.   Left hip incision stable NEUROLOGIC: Cranial nerves II through XII are intact. No focal Motor or sensory deficits b/l.   PSYCHIATRIC:  patient is alert and oriented x 3.  SKIN: No obvious rash, lesion, or ulcer.    LABORATORY PANEL:  CBC Recent Labs  Lab 06/23/20 0639  WBC 14.1*  HGB 11.4*  HCT 34.4*  PLT 322    Chemistries  Recent Labs  Lab 06/19/20 1233 06/20/20 0600 06/23/20 0639  NA 139   < > 143  K 3.9   < > 4.2  CL 103   < > 107  CO2 26   < > 27  GLUCOSE 102*   < > 107*  BUN 21   < > 23  CREATININE 0.90   < > 0.62  CALCIUM 8.7*   < > 8.8*  MG  --    < > 2.1  AST 18  --   --   ALT 22  --   --   ALKPHOS 90  --   --   BILITOT 0.7  --   --    < > = values in this interval not displayed.   Cardiac Enzymes No results for input(s): TROPONINI in the last 168 hours. RADIOLOGY:  No results found. ASSESSMENT AND PLAN:  [place] Murphy [name] a 87 y.o.femalewith medical history significant forVonda Murphy forhypertension,GERD,COPD,dyslipidemia, history of left breast cancer status post chemo and radiation therapywho presents to the emergency room via EMS from theskilled nursing facility where she was discharged to for subacute rehab following left hip pinning for an acute left subcapital femoral neck fracture. Her husband who is at the bedside states that she has continued to decline since her discharge to the skilled nursing facility and has had poor oral intake, constipation and generalized weakness.   Generalized weakness with poor PO intake and nausea constipation -- constipation relieved. Patient able to tolerate PO  diet. Eating much better. Much better spirits. -- Working with physical therapy recommends home health PT -- electrolyte stable  Suspected UTI with indwelling Foley catheter that was removed -- urine culture grew yeast -- antibiotics discontinued-- patient and husband agreeable -- no fever, no dysuria -- clinically no sign symptoms suggestive of pneumonia  # Hypophosphatemia /hypokalemia secondary to poor nutrition, started oral supplement. Started Ensure supplement -- improved  # Paroxysmal atrial fibrillation Continue metoprololandamiodarone for rate  control Continue Eliquis as primary prophylaxis for an acute stroke  # Status post left hip fracture Continue physical therapy Orthopedic consulted as patient was scheduled for postop check and staple removal-- seen by Dr. Rudene Christians recommends follow-up as outpatient  Hypertension -- continue metoprolol  Gerd -- on PPI  Dyslipidemia -- continue fish oil and statins    Procedures: Family communication : husband at bedside Consults : ortho CODE STATUS: full DVT Prophylaxis : Lovenox Level of care: Med-Surg Status is: Inpatient  Patient overall clinically improving. Will continue to monitor one more day. Plan to discharge tomorrow with home health. Discussed with patient and husband at bedside agreeable with plan        TOTAL TIME TAKING CARE OF THIS PATIENT: *25* minutes.  >50% time spent on counselling and coordination of care  Note: This dictation was prepared with Dragon dictation along with smaller phrase technology. Any transcriptional errors that result from this process are unintentional.  [name] M.D    [facility]   CC: Primary care physician; Glean Hess, MDPatient ID: [name], female   DOB: [birth year], 87 y.o.   MRN: [id number]

## 2020-06-23 NOTE — Plan of Care (Signed)
Pt eating and feeling better today.

## 2020-06-23 NOTE — Progress Notes (Signed)
Physical Therapy Treatment Patient Details Name: [name] MRN: [id number] DOB: Dec 26, 1933 Today's Date: 06/23/2020    History of Present Illness presented to ER from [facility] secondary to decreased PO intake, progressive weakness, abdominal pain; admitted for management of UTI. Of note, s/p L hip pinning 06/05/20, WBAT.    PT Comments    Patient able to increase gait distance (145') and initiate stair training (up/down 4' x2) with bilat rails this date, completing all mobility tasks with no greater than cga/close sup from helper.  Noted improvement in affect, energy level and overall level of participation this date. Engaged husband with hands-on training for patient regarding positioning, level of assist and safety needs with basic transfers, gait and stairs.  Husband responds well and provides good cues/communication to patient; good return demonstration of guarding/assist techniques.  Did issue gait belt for home use. Verbally reviewed car transfer technique, home safety modifications, activity progression, HEP (handouts issued previous date).  Patient and husband voiced understanding of all information provided.  Both patient and husband comfortable with upcoming discharge and voice comfort with care needs; no additional questions/concerns at this time.    Follow Up Recommendations  Home health PT     Equipment Recommendations  Rolling walker with 5" wheels    Recommendations for Other Services       Precautions / Restrictions Precautions Precautions: Fall Restrictions Weight Bearing Restrictions: Yes LLE Weight Bearing: Weight bearing as tolerated Other Position/Activity Restrictions: L shoulder pain    Mobility  Bed Mobility Overal bed mobility: Needs Assistance       Supine to sit: Supervision;Min guard     General bed mobility comments: bedrails required to complete movement    Transfers Overall transfer level: Needs assistance Equipment used: Rolling walker (2  wheeled) Transfers: Sit to/from Stand Sit to Stand: Min guard;Supervision         General transfer comment: cuing for hand placement and increased forward trunk lean/weight shift  Ambulation/Gait Ambulation/Gait assistance: Min guard;Supervision Gait Distance (Feet):  (145) Assistive device: Rolling walker (2 wheeled)       General Gait Details: reciprocal stepping pattern, increased cadence and overall gait speed; min cuing for bilat TKE in loading and postural extension as able   Stairs Stairs: Yes Stairs assistance: Min guard Stair Management: Two rails Number of Stairs:  (4 x2) General stair comments: step to gait pattern, min cuing for sequence/technique; initial trial completed with PT, second trial with husband.  Patient/husband voiced understanding of technique and level of assist required   Wheelchair Mobility    Modified Rankin (Stroke Patients Only)       Balance Overall balance assessment: Needs assistance Sitting-balance support: No upper extremity supported;Feet supported Sitting balance-Leahy Scale: Good     Standing balance support: Bilateral upper extremity supported Standing balance-Leahy Scale: Fair                              Cognition Arousal/Alertness: Awake/alert Behavior During Therapy: WFL for tasks assessed/performed Overall Cognitive Status: Within Functional Limits for tasks assessed                                        Exercises Other Exercises Other Exercises: Toilet transfer, ambulatory with RW, cga/close sup; does require use of handrail to complete sit/stand from lower surface height.  Standing balance at sink for hand  hygiene, close sup. Other Exercises: Engaged husband with hands-on training for patient regarding positioning, level of assist and safety needs with basic transfers, gait and stairs.  Husband responds well and provides good cues/communication to patient; good return demonstration of  guarding/assist techniques.  Voice comfort with performance and level of assist required. No additional questions at this time.  Did issue gait belt for home use. Other Exercises: Verbally reviewed car transfer technique, home safety modifications, activity progression, HEP (handouts issued previous date).  Patient and husband voiced understanding of all information provided.    General Comments        Pertinent Vitals/Pain Pain Assessment: Faces Faces Pain Scale: Hurts a little bit Pain Location: L anterior shoulder Pain Descriptors / Indicators: Sore;Grimacing Pain Intervention(s): Limited activity within patient's tolerance;Monitored during session;Repositioned    Home Living                      Prior Function            PT Goals (current goals can now be found in the care plan section) Acute Rehab PT Goals Patient Stated Goal: to get better PT Goal Formulation: With patient/family Time For Goal Achievement: 07/05/20 Potential to Achieve Goals: Good Progress towards PT goals: Progressing toward goals    Frequency    7X/week      PT Plan Current plan remains appropriate    Co-evaluation              AM-PAC PT "6 Clicks" Mobility   Outcome Measure  Help needed turning from your back to your side while in a flat bed without using bedrails?: None Help needed moving from lying on your back to sitting on the side of a flat bed without using bedrails?: None Help needed moving to and from a bed to a chair (including a wheelchair)?: None Help needed standing up from a chair using your arms (e.g., wheelchair or bedside chair)?: A Little Help needed to walk in hospital room?: A Little Help needed climbing 3-5 steps with a railing? : A Little 6 Click Score: 21    End of Session Equipment Utilized During Treatment: Gait belt Activity Tolerance: Patient tolerated treatment well Patient left: in chair;with call bell/phone within reach;with family/visitor  present Nurse Communication: Mobility status PT Visit Diagnosis: Unsteadiness on feet (R26.81);History of falling (Z91.81);Muscle weakness (generalized) (M62.81);Difficulty in walking, not elsewhere classified (R26.2)     Time: 7425-9563 PT Time Calculation (min) (ACUTE ONLY): 33 min  Charges:  $Gait Training: 8-22 mins $Therapeutic Activity: 8-22 mins                    Tavaria Mackins H. Owens Shark, PT, DPT, NCS 06/23/20, 4:07 PM [phone]

## 2020-06-24 ENCOUNTER — Other Ambulatory Visit: Payer: Self-pay

## 2020-06-24 MED ORDER — FUROSEMIDE 20 MG PO TABS: 20.0000 mg | ORAL_TABLET | ORAL | 0 refills | Status: DC | PRN

## 2020-06-24 MED ORDER — AMIODARONE HCL 200 MG PO TABS: 200.0000 mg | ORAL_TABLET | Freq: Two times a day (BID) | ORAL | 1 refills | Status: DC

## 2020-06-24 MED ORDER — APIXABAN 5 MG PO TABS: 5.0000 mg | ORAL_TABLET | Freq: Two times a day (BID) | ORAL | 2 refills | Status: DC

## 2020-06-24 MED ORDER — AMIODARONE HCL 200 MG PO TABS: 200.0000 mg | ORAL_TABLET | Freq: Two times a day (BID) | ORAL | 2 refills | Status: DC

## 2020-06-24 MED ORDER — ENSURE ENLIVE PO LIQD: 237.0000 mL | Freq: Three times a day (TID) | ORAL | 12 refills | Status: DC

## 2020-06-24 NOTE — Progress Notes (Signed)
Pt states understanding of discharge instructions. All medication reviewed. Patient left at 1130

## 2020-06-24 NOTE — Plan of Care (Signed)
Pt ready for discharge. Education done

## 2020-06-24 NOTE — Discharge Summary (Addendum)
[place] [facility] [place] NAME: [name]    MR#:  [id number]  DATE OF BIRTH:  30-Apr-1933  DATE OF ADMISSION:  06/19/2020 ADMITTING PHYSICIAN: Collier Bullock, MD  DATE OF DISCHARGE: 06/24/2020  PRIMARY CARE PHYSICIAN: Glean Hess, MD    ADMISSION DIAGNOSIS:  SOB (shortness of breath) [H41.93] Acute metabolic encephalopathy [X90.24]  DISCHARGE DIAGNOSIS:  generalized weakness-- improving acute metabolic encephalopathy resolved electrolyte abnormality improved paroxysmal atrial fibrillation on oral anticoagulation recent left hip fracture  SECONDARY DIAGNOSIS:   Past Medical History:  Diagnosis Date  . Allergy   . Bronchiectasis ([place])   . Cancer [facility])    left breast cancer   . GERD (gastroesophageal reflux disease)   . Hyperlipemia   . Hypertension   . Intractable nausea and vomiting 06/20/2018    HOSPITAL COURSE:   Taysha Majewski Helmsis a 87 y.o.femalewith medical history significant forVonda P forhypertension,GERD,COPD,dyslipidemia, history of left breast cancer status post chemo and radiation therapywho presents to the emergency room via EMS from theskilled nursing facility where she was discharged to for subacute rehab following left hip pinning for an acute left subcapital femoral neck fracture. Her husband who is at the bedside states that she has continued to decline since her discharge to the skilled nursing facility and has had poor oral intake, constipation and generalized weakness.  Acute metabolic encephalopathy Generalized weakness with poor PO intake and nausea constipation -- constipation relieved. Patient able to tolerate PO diet. Eating much better. Much better spirits. [place] -- Working with physical therapy recommends home health PT -- electrolytes stable  Suspected UTI with indwelling Foley catheter that was removed -- urine culture grew yeast -- antibiotics discontinued--  patient and husband agreeable -- no fever, no dysuria -- clinically no sign symptoms suggestive of pneumonia  # Hypophosphatemia /hypokalemia secondary to poor nutrition, started oral supplement. Started Ensure supplement -- improved  #Paroxysmal atrial fibrillation Continue metoprololandamiodarone for rate control Continue Eliquis as primary prophylaxis for an acute stroke -- patient will follow-up with [facility] cardiology  #Status post left hip fracture Continue physical therapy Orthopedic consulted as patient was scheduled for postop check and staple removal-- seen by Dr. Rudene Christians recommends follow-up as outpatient  Hypertension -- continue metoprolol  Gerd -- on PPI  Dyslipidemia -- continue fish oil and statins    Procedures: Family communication : husband at bedside Consults : ortho CODE STATUS: full DVT Prophylaxis : Lovenox Level of care: Med-Surg Status is: Inpatient  Patient overall clinically improving. She worked with physical therapy yesterday ambulated about 145 feet.Overall stable for discharge. Patient and husband agrees patient will discharge to home with home health PT CONSULTS OBTAINED:  Treatment Team:  Leim Fabry, MD  DRUG ALLERGIES:  No Known Allergies  DISCHARGE MEDICATIONS:   Allergies as of 06/24/2020   No Known Allergies     Medication List    STOP taking these medications   docusate sodium 100 MG capsule Commonly known as: COLACE   HYDROcodone-acetaminophen 5-325 MG tablet Commonly known as: NORCO/VICODIN     TAKE these medications   amiodarone 200 MG tablet Commonly known as: Pacerone Take 1 tablet (200 mg total) by mouth 2 (two) times daily. What changed: Another medication with the same name was removed. Continue taking this medication, and follow the directions you see here.   apixaban 5 MG Tabs tablet Commonly known as: ELIQUIS Take 1 tablet (5 mg total) by mouth 2 (two) times daily.  ascorbic acid 500  MG tablet Commonly known as: VITAMIN C Take 1 tablet (500 mg total) by mouth daily.   atorvastatin 20 MG tablet Commonly known as: LIPITOR Take 1 tablet (20 mg total) by mouth daily.   calcium-vitamin D 500-200 MG-UNIT tablet Commonly known as: OSCAL WITH D Take 1 tablet by mouth daily.   cetirizine 10 MG tablet Commonly known as: ZYRTEC Take 1 tablet (10 mg total) by mouth daily.   diclofenac Sodium 1 % Gel Commonly known as: VOLTAREN Apply 2 g topically 4 (four) times daily.   famotidine 20 MG tablet Commonly known as: PEPCID Take 1 tablet (20 mg total) by mouth 2 (two) times daily. PRN reflux   feeding supplement Liqd Take 237 mLs by mouth 3 (three) times daily between meals.   Fish Oil 1000 MG Caps Take 1 capsule (1,000 mg total) by mouth daily.   fluticasone 50 MCG/ACT nasal spray Commonly known as: FLONASE SHAKE LIQUID AND USE 2 SPRAYS IN EACH NOSTRIL DAILY AS NEEDED FOR ALLERGIES OR RHINITIS   folic acid 1 MG tablet Commonly known as: FOLVITE Take 1 mg by mouth daily. Takes every day expect Tuesday   furosemide 20 MG tablet Commonly known as: Lasix Take 1 tablet (20 mg total) by mouth as needed. For leg swelling What changed:  when to take this reasons to take this additional instructions   gabapentin 100 MG capsule Commonly known as: NEURONTIN Take 100 mg by mouth daily.   methotrexate 2.5 MG tablet Commonly known as: RHEUMATREX Take 10 mg by mouth once a week. Tuesdays   metoprolol tartrate 25 MG tablet Commonly known as: LOPRESSOR Take 0.5 tablets (12.5 mg total) by mouth 2 (two) times daily.   Multivitamin Adult Tabs Take 1 tablet by mouth daily.   senna-docusate 8.6-50 MG tablet Commonly known as: Senokot-S Take 1 tablet by mouth 2 (two) times daily.   vitamin B-12 1000 MCG tablet Commonly known as: CYANOCOBALAMIN Take 1 tablet (1,000 mcg total) by mouth daily.            Discharge Care Instructions  (From admission, onward)          Start     Ordered   06/24/20 0000  Discharge wound care:       Comments: Keep dressing dry--use bandage   06/24/20 1006          If you experience worsening of your admission symptoms, develop shortness of breath, life threatening emergency, suicidal or homicidal thoughts you must seek medical attention immediately by calling 911 or calling your MD immediately  if symptoms less severe.  You Must read complete instructions/literature along with all the possible adverse reactions/side effects for all the Medicines you take and that have been prescribed to you. Take any new Medicines after you have completely understood and accept all the possible adverse reactions/side effects.   Please note  You were cared for by a hospitalist during your hospital stay. If you have any questions about your discharge medications or the care you received while you were in the hospital after you are discharged, you can call the unit and asked to speak with the hospitalist on call if the hospitalist that took care of you is not available. Once you are discharged, your primary care physician will handle any further medical issues. Please note that NO REFILLS for any discharge medications will be authorized once you are discharged, as it is imperative that you return to your primary care physician (or  establish a relationship with a primary care physician if you do not have one) for your aftercare needs so that they can reassess your need for medications and monitor your lab values. Today   SUBJECTIVE   Doing well. Denies fever dysuria chest pain or shortness of breath. Husband at bedside.  VITAL SIGNS:  Blood pressure (!) 135/54, pulse 67, temperature 98 F (36.7 C), resp. rate 18, height 5\' 5"  (1.651 m), weight 61.2 kg, SpO2 94 %.  I/O:    Intake/Output Summary (Last 24 hours) at 06/24/2020 1112 Last data filed at 06/24/2020 1021 Gross per 24 hour  Intake 120 ml  Output --  Net 120 ml     PHYSICAL EXAMINATION:   GENERAL:  86 y.o.-year-old patient lying in the bed with no acute distress.  LUNGS: Normal breath sounds bilaterally, no wheezing, rales, rhonchi. No use of accessory muscles of respiration.  CARDIOVASCULAR: S1, S2 normal. No murmurs, rubs, or gallops.  ABDOMEN: Soft, nontender, nondistended. Bowel sounds present. No organomegaly or mass.  EXTREMITIES: No cyanosis, clubbing or edema b/l.   Left hip incision stable NEUROLOGIC: Cranial nerves II through XII are intact. No focal Motor or sensory deficits b/l.   PSYCHIATRIC:  patient is alert and oriented x 3.  SKIN: No obvious rash, lesion, or ulcer.  DATA REVIEW:   CBC  Recent Labs  Lab 06/23/20 0639  WBC 14.1*  HGB 11.4*  HCT 34.4*  PLT 322    Chemistries  Recent Labs  Lab 06/19/20 1233 06/20/20 0600 06/23/20 0639  NA 139   < > 143  K 3.9   < > 4.2  CL 103   < > 107  CO2 26   < > 27  GLUCOSE 102*   < > 107*  BUN 21   < > 23  CREATININE 0.90   < > 0.62  CALCIUM 8.7*   < > 8.8*  MG  --    < > 2.1  AST 18  --   --   ALT 22  --   --   ALKPHOS 90  --   --   BILITOT 0.7  --   --    < > = values in this interval not displayed.    Microbiology Results   Recent Results (from the past 240 hour(s))  Urine culture     Status: Abnormal   Collection Time: 06/19/20 12:33 PM   Specimen: Urine, Random  Result Value Ref Range Status   Specimen Description   Final    URINE, RANDOM Performed at [facility], [address], [place], [place] [address]    Special Requests   Final    NONE Performed at [facility], [place], [place] [address]    Culture 50,000 COLONIES/mL YEAST (A)  Final   Report Status 06/21/2020 FINAL  Final  Resp Panel by RT-PCR (Flu A&B, Covid) Nasopharyngeal Swab     Status: None   Collection Time: 06/19/20 12:34 PM   Specimen: Nasopharyngeal Swab; Nasopharyngeal(NP) swabs in vial transport medium  Result Value Ref Range Status   SARS  Coronavirus 2 by RT PCR NEGATIVE NEGATIVE Final    Comment: (NOTE) SARS-CoV-2 target nucleic acids are NOT DETECTED.  The SARS-CoV-2 RNA is generally detectable in upper respiratory specimens during the acute phase of infection. The lowest concentration of SARS-CoV-2 viral copies this assay can detect is 138 copies/mL. A negative result does not preclude SARS-Cov-2 infection and should not be used as  the sole basis for treatment or other patient management decisions. A negative result may occur with  improper specimen collection/handling, submission of specimen other than nasopharyngeal swab, presence of viral mutation(s) within the areas targeted by this assay, and inadequate number of viral copies(<138 copies/mL). A negative result must be combined with clinical observations, patient history, and epidemiological information. The expected result is Negative.  Fact Sheet for Patients:  EntrepreneurPulse.com.au  Fact Sheet for Healthcare Providers:  IncredibleEmployment.be  This test is no t yet approved or cleared by the [place] FDA and  has been authorized for detection and/or diagnosis of SARS-CoV-2 by FDA under an Emergency Use Authorization (EUA). This EUA will remain  in effect (meaning this test can be used) for the duration of the COVID-19 declaration under Section 564(b)(1) of the Act, 21 U.S.C.section 360bbb-3(b)(1), unless the authorization is terminated  or revoked sooner.       Influenza A by PCR NEGATIVE NEGATIVE Final   Influenza B by PCR NEGATIVE NEGATIVE Final    Comment: (NOTE) The Xpert Xpress SARS-CoV-2/FLU/RSV plus assay is intended as an aid in the diagnosis of influenza from Nasopharyngeal swab specimens and should not be used as a sole basis for treatment. Nasal washings and aspirates are unacceptable for Xpert Xpress SARS-CoV-2/FLU/RSV testing.  Fact Sheet for  Patients: EntrepreneurPulse.com.au  Fact Sheet for Healthcare Providers: IncredibleEmployment.be  This test is not yet approved or cleared by the [place] FDA and has been authorized for detection and/or diagnosis of SARS-CoV-2 by FDA under an Emergency Use Authorization (EUA). This EUA will remain in effect (meaning this test can be used) for the duration of the COVID-19 declaration under Section 564(b)(1) of the Act, 21 U.S.C. section 360bbb-3(b)(1), unless the authorization is terminated or revoked.  Performed at [facility], [address], [place], [place] [address]     RADIOLOGY:  No results found.   CODE STATUS:     Code Status Orders  (From admission, onward)         Start     Ordered   06/19/20 1455  Full code  Continuous        06/19/20 1455        Code Status History    Date Active Date Inactive Code Status Order ID Comments User Context   06/05/2020 1015 06/11/2020 2203 Full Code [id number]  Collier Bullock, MD ED   06/20/2018 1719 06/22/2018 1953 DNR [id number]  Mariel Aloe, MD Inpatient   06/20/2018 1637 06/20/2018 1719 Full Code [id number]  Mariel Aloe, MD Inpatient   Advance Care Planning Activity       TOTAL TIME TAKING CARE OF THIS PATIENT: 40 minutes.    [name] M.D  [facility]    CC: Primary care physician; Glean Hess, MD

## 2020-06-25 ENCOUNTER — Encounter: Payer: Medicare PPO | Admitting: Physical Therapy

## 2020-06-25 ENCOUNTER — Telehealth: Payer: Self-pay

## 2020-06-25 NOTE — Telephone Encounter (Signed)
Transition Care Management Follow-up Telephone Call  Date of discharge and from where: 06/24/20 [facility]  How have you been since you were released from the hospital? Spoke to patient's husband Elonda Husky; pt states doing okay. Ambulating with walker and assistance.   Any questions or concerns? No  Items Reviewed:  Did the pt receive and understand the discharge instructions provided? Yes   Medications obtained and verified? Yes   Other? No   Any new allergies since your discharge? No   Dietary orders reviewed? Yes  Do you have support at home? Yes   Home Care and Equipment/Supplies: Were home health services ordered? yes If so, what is the name of the agency? [facility] up a time to come to the patient's home? no Were any new equipment or medical supplies ordered?  Yes: walker What is the name of the medical supply agency? [employer] Were you able to get the supplies/equipment? yes Do you have any questions related to the use of the equipment or supplies? No  Functional Questionnaire: (I = Independent and D = Dependent) ADLs: D  Bathing/Dressing- D  Meal Prep- D  Eating- I  Maintaining continence- I  Transferring/Ambulation- I with assistance  Managing Meds- I with assistance  Follow up appointments reviewed:   PCP Hospital f/u appt confirmed? Yes  Scheduled to see Dr. Army Melia on 06/30/20 @ 8:20 .  [facility] f/u appt confirmed? Yes  Scheduled to see cardiology on 4/27/2.  Are transportation arrangements needed? No   If their condition worsens, is the pt aware to call PCP or go to the Emergency Dept.? Yes  Was the patient provided with contact information for the PCP's office or ED? Yes  Was to pt encouraged to call back with questions or concerns? Yes

## 2020-06-26 NOTE — Telephone Encounter (Signed)
Called pt, could not leave message

## 2020-06-29 DIAGNOSIS — I480 Paroxysmal atrial fibrillation: Secondary | ICD-10-CM | POA: Diagnosis not present

## 2020-06-29 DIAGNOSIS — J479 Bronchiectasis, uncomplicated: Secondary | ICD-10-CM | POA: Diagnosis not present

## 2020-06-29 DIAGNOSIS — I10 Essential (primary) hypertension: Secondary | ICD-10-CM | POA: Diagnosis not present

## 2020-06-29 DIAGNOSIS — E785 Hyperlipidemia, unspecified: Secondary | ICD-10-CM | POA: Diagnosis not present

## 2020-06-29 DIAGNOSIS — S72012D Unspecified intracapsular fracture of left femur, subsequent encounter for closed fracture with routine healing: Secondary | ICD-10-CM | POA: Diagnosis not present

## 2020-06-29 DIAGNOSIS — K219 Gastro-esophageal reflux disease without esophagitis: Secondary | ICD-10-CM | POA: Diagnosis not present

## 2020-06-29 DIAGNOSIS — K5900 Constipation, unspecified: Secondary | ICD-10-CM | POA: Diagnosis not present

## 2020-06-29 DIAGNOSIS — E876 Hypokalemia: Secondary | ICD-10-CM | POA: Diagnosis not present

## 2020-06-30 ENCOUNTER — Encounter: Payer: Self-pay | Admitting: Internal Medicine

## 2020-06-30 ENCOUNTER — Other Ambulatory Visit: Payer: Self-pay

## 2020-06-30 ENCOUNTER — Ambulatory Visit: Payer: Medicare PPO | Admitting: Internal Medicine

## 2020-06-30 ENCOUNTER — Encounter: Payer: Medicare PPO | Admitting: Physical Therapy

## 2020-06-30 ENCOUNTER — Telehealth: Payer: Self-pay | Admitting: Internal Medicine

## 2020-06-30 VITALS — BP 124/84 | HR 70 | Temp 98.4°F | Ht 65.0 in

## 2020-06-30 DIAGNOSIS — R112 Nausea with vomiting, unspecified: Secondary | ICD-10-CM

## 2020-06-30 DIAGNOSIS — I10 Essential (primary) hypertension: Secondary | ICD-10-CM

## 2020-06-30 DIAGNOSIS — S72002S Fracture of unspecified part of neck of left femur, sequela: Secondary | ICD-10-CM

## 2020-06-30 DIAGNOSIS — I480 Paroxysmal atrial fibrillation: Secondary | ICD-10-CM | POA: Diagnosis not present

## 2020-06-30 MED ORDER — ONDANSETRON 4 MG PO TBDP: 4.0000 mg | ORAL_TABLET | Freq: Three times a day (TID) | ORAL | 0 refills | Status: DC | PRN

## 2020-06-30 NOTE — Telephone Encounter (Signed)
Copied from [place] [id number]. Topic: Quick Communication - Home Health Verbal Orders >> Jun 30, 2020  3:16 PM Jodie Echevaria wrote: Caller/Agency: [facility] Number: [phone] ok to LM Requesting OT/PT/Skilled Nursing/Social Work/Speech Therapy: PT  Frequency: twice week for 4 weeks, one time week for 5 weeks

## 2020-06-30 NOTE — Telephone Encounter (Signed)
Spoke to [place] gave him verbal orders. He verbalized understanding.  KP

## 2020-06-30 NOTE — Patient Instructions (Signed)
Stop Amiodarone  Do not start Furosemide - may continue HCTZ  Use Zofran every 8 hours

## 2020-06-30 NOTE — Progress Notes (Signed)
Date:  06/30/2020   Name:  [name]   DOB:  05-09-1933   MRN:  [id number]   Chief Complaint: Hypertension and Hospitalization Follow-up (Feels nauseous ) Hospital follow up.  Admitted to [facility] 06/19/20 to 06/24/20.  TOC call done on 06/25/20. She was initially admitted for hip fracture on left.  Had post op Afib that converted to SR.  Sent to Rehab the admitted 06/19/20 for the issues below. Now home with husband and HH.  Getting around with walker.  Minimal hip pain.  Acute metabolic encephalopathy Generalized weakness with poor PO intake and nausea constipation --constipation relieved. Patient able to tolerate PO diet. Eating much better. Much better spirits. Mentation back to base --Working with physical therapy recommends home health PT --electrolytes stable >>> not constipated now.  Having normal stools.  Still very nauseated which is worsened by eating.  Poor po intake but trying to drink fluids.  Had zofran in the hospital with minimal benefit.  Suspected UTI with indwelling Foley catheter that was removed --urine culture grew yeast -- antibiotics discontinued--patient and husband agreeable --no fever, no dysuria --clinically no sign symptoms suggestive of pneumonia >>> denies dysuria.  # Hypophosphatemia/hypokalemiasecondary to poor nutrition, started oral supplement. Started Ensure supplement --improved >>> drinking some boost with meals.  About one Boost per day.  #Paroxysmal atrial fibrillation Continue metoprololandamiodarone for rate control Continue Eliquis as primary prophylaxis for an acute stroke -- patient will follow-up with [facility] cardiology >>> now on Amiodarone 200 mg bid and Eliquis.  This was started at the previous hospital stay for hip fracture due to post op Afib that was transient.  It was not seen on this admission but amiodarone continued  #Status post left hip fracture Continue physical therapy Orthopedic consulted as patient  was scheduled for postop check and staple removal--seen by Dr. Rudene Christians recommends follow-up as outpatient >>> Doing well with little pain and not taking any pain meds.  PT is coming to the home.  Hypertension --continue metoprolol  Jerrye Bushy --on PPI >>> on famotidine bid.  Dyslipidemia --continue fish oil and statins HPI  Lab Results  Component Value Date   CREATININE 0.62 06/23/2020   BUN 23 06/23/2020   NA 143 06/23/2020   K 4.2 06/23/2020   CL 107 06/23/2020   CO2 27 06/23/2020   Lab Results  Component Value Date   CHOL 206 (H) 01/09/2020   HDL 93 01/09/2020   LDLCALC 98 01/09/2020   TRIG 86 01/09/2020   CHOLHDL 2.2 01/09/2020   Lab Results  Component Value Date   TSH 3.538 06/09/2020   No results found for: HGBA1C Lab Results  Component Value Date   WBC 14.1 (H) 06/23/2020   HGB 11.4 (L) 06/23/2020   HCT 34.4 (L) 06/23/2020   MCV 92.0 06/23/2020   PLT 322 06/23/2020   Lab Results  Component Value Date   ALT 22 06/19/2020   AST 18 06/19/2020   ALKPHOS 90 06/19/2020   BILITOT 0.7 06/19/2020     Review of Systems  Constitutional: Positive for appetite change and fatigue. Negative for chills, diaphoresis and fever.  HENT: Negative for trouble swallowing.   Respiratory: Negative for chest tightness, shortness of breath and wheezing.   Cardiovascular: Positive for leg swelling. Negative for chest pain and palpitations.  Gastrointestinal: Positive for nausea and vomiting (dry heaves). Negative for constipation.  Musculoskeletal: Positive for gait problem (uses walker).  Neurological: Positive for light-headedness. Negative for dizziness and headaches.  Patient Active Problem List   Diagnosis Date Noted  . Acute metabolic encephalopathy 99991111  . Foley catheter in place on admission 06/19/2020  . AF (paroxysmal atrial fibrillation) ([place]) 06/19/2020  . Atrial fibrillation with rapid ventricular response ([place]) 06/09/2020  . Fracture of femoral  neck, left, closed ([place]) 06/05/2020  . Acute lower UTI 06/05/2020  . Atherosclerosis of aorta ([place]) 01/09/2020  . Stage 2 moderate COPD by GOLD classification ([place]) 01/09/2020  . HSV (herpes simplex virus) infection 01/09/2020  . BCC (basal cell carcinoma), face 10/12/2017  . Environmental and seasonal allergies 10/30/2014  . Psoriasis vulgaris 10/30/2014  . Essential (primary) hypertension 10/30/2014  . Gastro-esophageal reflux disease without esophagitis 10/30/2014  . Hot flash, menopausal 10/30/2014  . Hyperlipidemia, mixed 10/30/2014  . History of cancer of left breast 10/30/2014  . Bronchiectasis ([place]) 09/24/2013    No Known Allergies  Past Surgical History:  Procedure Laterality Date  . BREAST LUMPECTOMY Left 2008  . CHOLECYSTECTOMY    . HIP PINNING,CANNULATED Left 06/05/2020   Procedure: CANNULATED HIP PINNING;  Surgeon: Hessie Knows, MD;  Location: [facility] ORS;  Service: Orthopedics;  Laterality: Left;  . PARTIAL HYSTERECTOMY    . TEE WITHOUT CARDIOVERSION N/A 06/10/2020   Procedure: TRANSESOPHAGEAL ECHOCARDIOGRAM (TEE);  Surgeon: Kate Sable, MD;  Location: [facility] ORS;  Service: Cardiovascular;  Laterality: N/A;    Social History   Tobacco Use  . Smoking status: Never Smoker  . Smokeless tobacco: Never Used  Vaping Use  . Vaping Use: Never used  Substance Use Topics  . Alcohol use: No  . Drug use: No     Medication list has been reviewed and updated.  Current Meds  Medication Sig  . amiodarone (PACERONE) 200 MG tablet Take 1 tablet (200 mg total) by mouth 2 (two) times daily.  Marland Kitchen apixaban (ELIQUIS) 5 MG TABS tablet Take 1 tablet (5 mg total) by mouth 2 (two) times daily.  Marland Kitchen ascorbic acid (VITAMIN C) 500 MG tablet Take 1 tablet (500 mg total) by mouth daily.  Marland Kitchen atorvastatin (LIPITOR) 20 MG tablet Take 1 tablet (20 mg total) by mouth daily.  . calcium-vitamin D (OSCAL WITH D) 500-200 MG-UNIT tablet Take 1 tablet by mouth daily.  . cetirizine (ZYRTEC) 10 MG  tablet Take 1 tablet (10 mg total) by mouth daily.  . diclofenac Sodium (VOLTAREN) 1 % GEL Apply 2 g topically 4 (four) times daily.  . famotidine (PEPCID) 20 MG tablet Take 1 tablet (20 mg total) by mouth 2 (two) times daily. PRN reflux  . feeding supplement (ENSURE ENLIVE / ENSURE PLUS) LIQD Take 237 mLs by mouth 3 (three) times daily between meals.  . fluticasone (FLONASE) 50 MCG/ACT nasal spray SHAKE LIQUID AND USE 2 SPRAYS IN EACH NOSTRIL DAILY AS NEEDED FOR ALLERGIES OR RHINITIS  . folic acid (FOLVITE) 1 MG tablet Take 1 mg by mouth daily. Takes every day expect Tuesday  . gabapentin (NEURONTIN) 100 MG capsule Take 100 mg by mouth daily.  . methotrexate (RHEUMATREX) 2.5 MG tablet Take 10 mg by mouth once a week. Tuesdays  . metoprolol tartrate (LOPRESSOR) 25 MG tablet Take 0.5 tablets (12.5 mg total) by mouth 2 (two) times daily.  . Multiple Vitamin (MULTIVITAMIN ADULT) TABS Take 1 tablet by mouth daily.  . Omega-3 Fatty Acids (FISH OIL) 1000 MG CAPS Take 1 capsule (1,000 mg total) by mouth daily.  Marland Kitchen senna-docusate (SENOKOT-S) 8.6-50 MG tablet Take 1 tablet by mouth 2 (two) times daily.  . vitamin B-12 (CYANOCOBALAMIN)  1000 MCG tablet Take 1 tablet (1,000 mcg total) by mouth daily.    PHQ 2/9 Scores 06/30/2020 02/10/2020 01/09/2020 05/24/2019  PHQ - 2 Score 0 0 0 0  PHQ- 9 Score 4 - 0 0    GAD 7 : Generalized Anxiety Score 06/30/2020 01/09/2020  Nervous, Anxious, on Edge 0 0  Control/stop worrying 0 0  Worry too much - different things 0 0  Trouble relaxing 0 0  Restless 0 0  Easily annoyed or irritable 0 0  Afraid - awful might happen 0 0  Total GAD 7 Score 0 0  Anxiety Difficulty - Not difficult at all    BP Readings from Last 3 Encounters:  06/30/20 124/84  06/24/20 (!) 135/54  06/11/20 (!) 136/50    Physical Exam Vitals and nursing note reviewed.  Constitutional:      General: She is not in acute distress.    Appearance: She is well-developed. She is ill-appearing.   HENT:     Head: Normocephalic and atraumatic.  Cardiovascular:     Rate and Rhythm: Normal rate and regular rhythm.     Pulses: Normal pulses.  Pulmonary:     Effort: Pulmonary effort is normal. No respiratory distress.     Breath sounds: Normal breath sounds. No wheezing or rhonchi.  Abdominal:     General: Abdomen is flat.     Palpations: Abdomen is soft. There is no mass.     Tenderness: There is no abdominal tenderness. There is no guarding or rebound.  Musculoskeletal:     Right lower leg: Edema (trace) present.     Left lower leg: Edema (trace) present.  Skin:    General: Skin is warm and dry.     Findings: Lesion present. No rash.          Comments: 1 cm x 0.3 cm dark lesion with darker center - eschar vs nevi  Neurological:     Mental Status: She is alert and oriented to person, place, and time.  Psychiatric:        Mood and Affect: Mood normal.        Behavior: Behavior normal.     Wt Readings from Last 3 Encounters:  06/19/20 135 lb (61.2 kg)  06/11/20 150 lb 2.1 oz (68.1 kg)  02/10/20 126 lb 12.8 oz (57.5 kg)    BP 124/84   Pulse 70   Temp 98.4 F (36.9 C) (Oral)   Ht 5\' 5"  (1.651 m)   SpO2 95%   BMI 22.47 kg/m   Assessment and Plan: 1. AF (paroxysmal atrial fibrillation) ([place]) Has been in SR since the episode after surgery - converted spontaneously Suspect that Amiodarone may be causing nausea Stop for now until Cardiology evaluation Never got Rx for furosemide - would not start at this time due to poor po intake  2. Essential (primary) hypertension Clinically stable exam with well controlled BP on metoprolol and hctz. Tolerating medications without side effects at this time.  3. Closed fracture of neck of left femur, sequela Doing well with PT and ambulation s/p left hip fracture surgery 4 weeks ago  4. Intractable nausea and vomiting Begin zofran q8h [place] diet recommended - advance as tolerated Hold all non essential supplements for 1-2  weeks - ondansetron (ZOFRAN-ODT) 4 MG disintegrating tablet; Take 1 tablet (4 mg total) by mouth every 8 (eight) hours as needed for nausea or vomiting.  Dispense: 30 tablet; Refill: 0  Pt and husband to monitor skin lesion -  may need to see Dermatology Partially dictated using Dragon software. Any errors are unintentional.  Halina Maidens, MD [facility]  06/30/2020

## 2020-06-30 NOTE — Progress Notes (Signed)
Cardiology Office Note:    Date:  07/01/2020   ID:  [name], DOB 11-03-1933, MRN [id number]  PCP:  Glean Hess, MD  [facility] Cardiologist:  Nelva Bush, MD  [facility] Electrophysiologist:  None   Referring MD: Glean Hess, MD   Chief Complaint: Hospital f/u  History of Present Illness:    [name] is a 87 y.o. female with a hx of breast cancer, pulmonary HTN, HTN, and new afib on Eliquis.   Seen in admission 4/1-4/7 for fall/left femur fx. Post-procedure developed afib RVR and converted to SR. Started on Eliquis with CHADSVASC of 4-5. Echo showed LVEF 65-70%, G2DD, mod elevated pulmonary artery systolic pressure, systolic pressure 76.1YWVP. She was discharged to a rehab center/   She was readmitted 4/15-4/20 for SOB, UTI, AMS. Foley cath was removed and she was treated for yeast infection. Mental status improved and she was sent home.   Today, the patient remains in SR with heart rate 71bpm. the patient reports swelling in her left ankle 2 days ago, it is better today. Since discharge she has been sick to her stomach with nasuea, some dry heaving. Hard to keep any food down.They think this is from the amiodarone. She saw her PCP, Dr. Kalman Shan who instructed she stop the amiodarone yesterday. She initially felt better, but today still feels a little sick. She is taking Zofran at home. She has been taking Eliquis, denies bleeding issues. Needs f/u CBC. Since she hasn't been eating so still very weak. She has been drinking a little bit. Has nausea an dry heaves. No chest pain or sob. No fever or chills. Left hip has been ok, she is doing PT at home. Follows-up with orthopedic next week. BP here good. A home it's around 140s/70s. Increase metoprolol 25mg  BID. Had some electrolyte abnormalities in the hospital, needs BMET.No chest pain, SOB, orthopnea, pnd, fever, or chills.    Past Medical History:  Diagnosis Date  . Allergy   . Bronchiectasis ([place])   .  Cancer [facility])    left breast cancer   . GERD (gastroesophageal reflux disease)   . Hyperlipemia   . Hypertension   . Intractable nausea and vomiting 06/20/2018    Past Surgical History:  Procedure Laterality Date  . BREAST LUMPECTOMY Left 2008  . CHOLECYSTECTOMY    . HIP PINNING,CANNULATED Left 06/05/2020   Procedure: CANNULATED HIP PINNING;  Surgeon: Hessie Knows, MD;  Location: [facility] ORS;  Service: Orthopedics;  Laterality: Left;  . PARTIAL HYSTERECTOMY    . TEE WITHOUT CARDIOVERSION N/A 06/10/2020   Procedure: TRANSESOPHAGEAL ECHOCARDIOGRAM (TEE);  Surgeon: Kate Sable, MD;  Location: [facility] ORS;  Service: Cardiovascular;  Laterality: N/A;    Current Medications: Current Meds  Medication Sig  . apixaban (ELIQUIS) 5 MG TABS tablet Take 1 tablet (5 mg total) by mouth 2 (two) times daily.  Marland Kitchen ascorbic acid (VITAMIN C) 500 MG tablet Take 1 tablet (500 mg total) by mouth daily.  Marland Kitchen atorvastatin (LIPITOR) 20 MG tablet Take 1 tablet (20 mg total) by mouth daily.  . calcium-vitamin D (OSCAL WITH D) 500-200 MG-UNIT tablet Take 1 tablet by mouth daily.  . cetirizine (ZYRTEC) 10 MG tablet Take 1 tablet (10 mg total) by mouth daily.  . diclofenac Sodium (VOLTAREN) 1 % GEL Apply 2 g topically 4 (four) times daily.  . famotidine (PEPCID) 20 MG tablet Take 1 tablet (20 mg total) by mouth 2 (two) times daily. PRN reflux  . feeding supplement (  ENSURE ENLIVE / ENSURE PLUS) LIQD Take 237 mLs by mouth 3 (three) times daily between meals.  . fluticasone (FLONASE) 50 MCG/ACT nasal spray SHAKE LIQUID AND USE 2 SPRAYS IN EACH NOSTRIL DAILY AS NEEDED FOR ALLERGIES OR RHINITIS  . folic acid (FOLVITE) 1 MG tablet Take 1 mg by mouth daily. Takes every day expect Tuesday  . furosemide (LASIX) 20 MG tablet Take 1 tablet (20 mg total) by mouth as needed. For leg swelling  . gabapentin (NEURONTIN) 100 MG capsule Take 100 mg by mouth daily.  . hydrochlorothiazide (MICROZIDE) 12.5 MG capsule Take 12.5 mg by mouth  daily.  . methotrexate (RHEUMATREX) 2.5 MG tablet Take 10 mg by mouth once a week. Tuesdays  . metoprolol tartrate (LOPRESSOR) 25 MG tablet Take 1 tablet (25 mg total) by mouth 2 (two) times daily.  . Multiple Vitamin (MULTIVITAMIN ADULT) TABS Take 1 tablet by mouth daily.  . Omega-3 Fatty Acids (FISH OIL) 1000 MG CAPS Take 1 capsule (1,000 mg total) by mouth daily.  . ondansetron (ZOFRAN-ODT) 4 MG disintegrating tablet Take 1 tablet (4 mg total) by mouth every 8 (eight) hours as needed for nausea or vomiting.  . senna-docusate (SENOKOT-S) 8.6-50 MG tablet Take 1 tablet by mouth 2 (two) times daily.  . vitamin B-12 (CYANOCOBALAMIN) 1000 MCG tablet Take 1 tablet (1,000 mcg total) by mouth daily.  . [DISCONTINUED] metoprolol tartrate (LOPRESSOR) 25 MG tablet Take 0.5 tablets (12.5 mg total) by mouth 2 (two) times daily.     Allergies:   Patient has no known allergies.   Social History   Socioeconomic History  . Marital status: Married    Spouse name: Not on file  . Number of children: 2  . Years of education: Not on file  . Highest education level: Not on file  Occupational History  . Not on file  Tobacco Use  . Smoking status: Never Smoker  . Smokeless tobacco: Never Used  Vaping Use  . Vaping Use: Never used  Substance and Sexual Activity  . Alcohol use: No  . Drug use: No  . Sexual activity: Not on file  Other Topics Concern  . Not on file  Social History Narrative  . Not on file   Social Determinants of Health   Financial Resource Strain: Low Risk   . Difficulty of Paying Living Expenses: Not hard at all  Food Insecurity: No Food Insecurity  . Worried About Charity fundraiser in the Last Year: Never true  . Ran Out of Food in the Last Year: Never true  Transportation Needs: No Transportation Needs  . Lack of Transportation (Medical): No  . Lack of Transportation (Non-Medical): No  Physical Activity: Sufficiently Active  . Days of Exercise per Week: 7 days  .  Minutes of Exercise per Session: 30 min  Stress: No Stress Concern Present  . Feeling of Stress : Not at all  Social Connections: Moderately Isolated  . Frequency of Communication with Friends and Family: More than three times a week  . Frequency of Social Gatherings with Friends and Family: Once a week  . Attends Religious Services: Never  . Active Member of Clubs or Organizations: No  . Attends Archivist Meetings: Never  . Marital Status: Married     Family History: The patient's family history includes Breast cancer in her sister.  ROS:   Please see the history of present illness.     All other systems reviewed and are negative.  EKGs/Labs/Other Studies  Reviewed:    The following studies were reviewed today:  Echo 06/10/20 1. Left ventricular ejection fraction, by estimation, is 65 to 70%. The  left ventricle has normal function. The left ventricle has no regional  wall motion abnormalities. There is mild left ventricular hypertrophy.  Left ventricular diastolic parameters  are consistent with Grade II diastolic dysfunction (pseudonormalization).  2. Right ventricular systolic function is normal. The right ventricular  size is normal. There is moderately elevated pulmonary artery systolic  pressure. The estimated right ventricular systolic pressure is 26.7 mmHg.  3. Left atrial size was mildly dilated.  4. The mitral valve is degenerative. No evidence of mitral valve  regurgitation. Mild mitral stenosis.  5. The aortic valve is tricuspid. Aortic valve regurgitation is not  visualized.  6. The inferior vena cava is normal in size with greater than 50%  respiratory variability, suggesting right atrial pressure of 3 mmHg.    EKG:  EKG is ordered today.  The ekg ordered today demonstrates NSR 71bpm, nonspecific ST changes  Recent Labs: 06/09/2020: B Natriuretic Peptide 185.9; TSH 3.538 06/19/2020: ALT 22 06/23/2020: BUN 23; Creatinine, Ser 0.62; Hemoglobin 11.4;  Magnesium 2.1; Platelets 322; Potassium 4.2; Sodium 143  Recent Lipid Panel    Component Value Date/Time   CHOL 206 (H) 01/09/2020 1026   CHOL 280 (H) 10/08/2013 0516   TRIG 86 01/09/2020 1026   TRIG 112 10/08/2013 0516   HDL 93 01/09/2020 1026   HDL 106 (H) 10/08/2013 0516   CHOLHDL 2.2 01/09/2020 1026   VLDL 22 10/08/2013 0516   LDLCALC 98 01/09/2020 1026   LDLCALC 152 (H) 10/08/2013 0516     Physical Exam:    VS:  BP 134/60 (BP Location: Right Arm, Patient Position: Sitting, Cuff Size: Normal)   Pulse 71   Ht 5\' 3"  (1.6 m)   Wt 124 lb (56.2 kg)   BMI 21.97 kg/m     Wt Readings from Last 3 Encounters:  07/01/20 124 lb (56.2 kg)  06/19/20 135 lb (61.2 kg)  06/11/20 150 lb 2.1 oz (68.1 kg)    GEN: frail elderly WF HEENT: Normal NECK: No JVD; No carotid bruits LYMPHATICS: No lymphadenopathy CARDIAC: RRR, no murmurs, rubs, gallops RESPIRATORY:  Clear to auscultation without rales, wheezing or rhonchi  ABDOMEN: Soft, non-tender, non-distended MUSCULOSKELETAL:  No edema; No deformity  SKIN: Warm and dry NEUROLOGIC:  Alert and oriented x 3 PSYCHIATRIC:  Normal affect   ASSESSMENT:    1. Paroxysmal A-fib ([place])   2. Essential hypertension   3. Pulmonary hypertension, unspecified ([place])   4. Electrolyte abnormality   5. Chronic diastolic heart failure (H[facility])    PLAN:    In order of problems listed above:  Paroxysmal Afib She had post-op afib after left hip surgery. CHADSVASC = 4-5 and starte don Eliquis and amiodarone. Patient has been feeling sick to her stomach since hospitalization, thought to be from amiodarone. PCP stopped is yesterday, seemed to help a little but sick this morning. Takes a while to fully get out of the system. She is in NSR today with heart rate of 71bpm. Ok to stop amiodarone. Patient says Bps at home are a little high. I will increase metoprolol from 12.5mg  to 25mg  BID. Continue Eliquis. CBC today.   HTN Mildly elevated at home. HCTZ started  yesterday by PCP (unsure of the dose). Increase metoprolol as above. We will see her back in 2 weeks for a re-check.   HFpEF She was sent home on  lasix 20mg  PRN but pharmacy never got it and she never started it. Seen by PCP yesterday, who felt it was not needed. She was started on HCTZ instead. Also patient has not been eating/drinking normally due to stomach sickness. Euvolemic on exam today. BMET today. We will see her in 2 weeks and evaluate need for a diuretic at that time.   Electrolyte abnormalities During the hospitalization she had low K and phosphate. BMET today.  Left hip fx/surgery She is working with PT at home and making slow progress. Still very weak since she is sick to her stomach. She has Zofran at home. Hopefully stopping amiodarone will also improve eating and eventually strength. Sees ortho next week.   Pulmonary HTN Refer to sleep study at follow-up.   Disposition: Follow up in 2 week(s) with APP  Signed, Riese Hellard Ninfa Meeker, PA-C  07/01/2020 11:19 AM    [name]

## 2020-07-01 ENCOUNTER — Encounter: Payer: Self-pay | Admitting: Medical

## 2020-07-01 ENCOUNTER — Other Ambulatory Visit
Admission: RE | Admit: 2020-07-01 | Discharge: 2020-07-01 | Disposition: A | Discharge status: Home / Self Care | Payer: Medicare PPO | Source: Ambulatory Visit | Attending: Medical | Admitting: Medical

## 2020-07-01 ENCOUNTER — Ambulatory Visit: Payer: Medicare PPO | Admitting: Medical

## 2020-07-01 VITALS — BP 134/60 | HR 71 | Ht 63.0 in | Wt 124.0 lb

## 2020-07-01 DIAGNOSIS — I480 Paroxysmal atrial fibrillation: Secondary | ICD-10-CM | POA: Insufficient documentation

## 2020-07-01 DIAGNOSIS — I5032 Chronic diastolic (congestive) heart failure: Secondary | ICD-10-CM

## 2020-07-01 DIAGNOSIS — E878 Other disorders of electrolyte and fluid balance, not elsewhere classified: Secondary | ICD-10-CM

## 2020-07-01 DIAGNOSIS — I10 Essential (primary) hypertension: Secondary | ICD-10-CM | POA: Insufficient documentation

## 2020-07-01 DIAGNOSIS — I272 Pulmonary hypertension, unspecified: Secondary | ICD-10-CM | POA: Diagnosis not present

## 2020-07-01 LAB — BASIC METABOLIC PANEL
Anion gap: 11 (ref 5–15)
BUN: 22 mg/dL (ref 8–23)
CO2: 25 mmol/L (ref 22–32)
Calcium: 9.2 mg/dL (ref 8.9–10.3)
Chloride: 103 mmol/L (ref 98–111)
Creatinine, Ser: 0.86 mg/dL (ref 0.44–1.00)
GFR, Estimated: >60 mL/min (ref >60)
Glucose, Bld: 103 mg/dL — ABNORMAL HIGH (ref 70–99)
Potassium: 4.1 mmol/L (ref 3.5–5.1)
Sodium: 139 mmol/L (ref 135–145)

## 2020-07-01 LAB — CBC
HCT: 39.3 % (ref 36.0–46.0)
Hemoglobin: 12.7 g/dL (ref 12.0–15.0)
MCH: 30.4 pg (ref 26.0–34.0)
MCHC: 32.3 g/dL (ref 30.0–36.0)
MCV: 94 fL (ref 80.0–100.0)
Platelets: 246 10*3/uL (ref 150–400)
RBC: 4.18 MIL/uL (ref 3.87–5.11)
RDW: 14.4 % (ref 11.5–15.5)
WBC: 11.3 10*3/uL — ABNORMAL HIGH (ref 4.0–10.5)
nRBC: 0 % (ref 0.0–0.2)

## 2020-07-01 MED ORDER — METOPROLOL TARTRATE 25 MG PO TABS: 25.0000 mg | ORAL_TABLET | Freq: Two times a day (BID) | ORAL | 3 refills | Status: DC

## 2020-07-01 NOTE — Patient Instructions (Addendum)
Medication Instructions:  Your physician has recommended you make the following change in your medication:   1. INCREASE Metoprolol tartrate to 25 mg twice a day 2. STOP Amiodarone  *If you need a refill on your cardiac medications before your next appointment, please call your pharmacy*   Lab Work: BMET & CBC today  If you have labs (blood work) drawn today and your tests are completely normal, you will receive your results only by: Marland Kitchen MyChart Message (if you have MyChart) OR . A paper copy in the mail If you have any lab test that is abnormal or we need to change your treatment, we will call you to review the results.   Testing/Procedures: None   Follow-Up: At [facility], you and your health needs are our priority.  As part of our continuing mission to provide you with exceptional heart care, we have created designated Provider Care Teams.  These Care Teams include your primary Cardiologist (physician) and Advanced Practice Providers (APPs -  Physician Assistants and Nurse Practitioners) who all work together to provide you with the care you need, when you need it.   Your next appointment:   2 week(s)  The format for your next appointment:   In Person  Provider:   You may see Nelva Bush, MD or one of the following Advanced Practice Providers on your designated Care Team:    Murray Hodgkins, NP  Christell Faith, PA-C  Marrianne Mood, PA-C  Cadence Polo, [place]  Laurann Montana, NP

## 2020-07-02 DIAGNOSIS — I480 Paroxysmal atrial fibrillation: Secondary | ICD-10-CM | POA: Diagnosis not present

## 2020-07-02 DIAGNOSIS — K219 Gastro-esophageal reflux disease without esophagitis: Secondary | ICD-10-CM | POA: Diagnosis not present

## 2020-07-02 DIAGNOSIS — K5900 Constipation, unspecified: Secondary | ICD-10-CM | POA: Diagnosis not present

## 2020-07-02 DIAGNOSIS — E876 Hypokalemia: Secondary | ICD-10-CM | POA: Diagnosis not present

## 2020-07-02 DIAGNOSIS — I10 Essential (primary) hypertension: Secondary | ICD-10-CM | POA: Diagnosis not present

## 2020-07-02 DIAGNOSIS — S72012D Unspecified intracapsular fracture of left femur, subsequent encounter for closed fracture with routine healing: Secondary | ICD-10-CM | POA: Diagnosis not present

## 2020-07-02 DIAGNOSIS — J479 Bronchiectasis, uncomplicated: Secondary | ICD-10-CM | POA: Diagnosis not present

## 2020-07-02 DIAGNOSIS — E785 Hyperlipidemia, unspecified: Secondary | ICD-10-CM | POA: Diagnosis not present

## 2020-07-04 ENCOUNTER — Emergency Department
Admission: EM | Admit: 2020-07-04 | Discharge: 2020-07-04 | Disposition: A | Discharge status: Home / Self Care | Payer: Medicare PPO | Attending: Emergency Medicine | Admitting: Emergency Medicine

## 2020-07-04 ENCOUNTER — Other Ambulatory Visit: Payer: Self-pay

## 2020-07-04 ENCOUNTER — Emergency Department: Payer: Medicare PPO

## 2020-07-04 DIAGNOSIS — I480 Paroxysmal atrial fibrillation: Secondary | ICD-10-CM | POA: Diagnosis not present

## 2020-07-04 DIAGNOSIS — Z853 Personal history of malignant neoplasm of breast: Secondary | ICD-10-CM | POA: Insufficient documentation

## 2020-07-04 DIAGNOSIS — R1110 Vomiting, unspecified: Secondary | ICD-10-CM | POA: Diagnosis not present

## 2020-07-04 DIAGNOSIS — J449 Chronic obstructive pulmonary disease, unspecified: Secondary | ICD-10-CM | POA: Insufficient documentation

## 2020-07-04 DIAGNOSIS — I10 Essential (primary) hypertension: Secondary | ICD-10-CM | POA: Diagnosis not present

## 2020-07-04 DIAGNOSIS — D72829 Elevated white blood cell count, unspecified: Secondary | ICD-10-CM | POA: Diagnosis not present

## 2020-07-04 DIAGNOSIS — R1013 Epigastric pain: Secondary | ICD-10-CM | POA: Diagnosis not present

## 2020-07-04 DIAGNOSIS — Z7901 Long term (current) use of anticoagulants: Secondary | ICD-10-CM | POA: Insufficient documentation

## 2020-07-04 DIAGNOSIS — K219 Gastro-esophageal reflux disease without esophagitis: Secondary | ICD-10-CM | POA: Diagnosis not present

## 2020-07-04 DIAGNOSIS — R110 Nausea: Secondary | ICD-10-CM | POA: Diagnosis present

## 2020-07-04 DIAGNOSIS — K449 Diaphragmatic hernia without obstruction or gangrene: Secondary | ICD-10-CM

## 2020-07-04 DIAGNOSIS — Z79899 Other long term (current) drug therapy: Secondary | ICD-10-CM | POA: Insufficient documentation

## 2020-07-04 DIAGNOSIS — R112 Nausea with vomiting, unspecified: Secondary | ICD-10-CM | POA: Diagnosis not present

## 2020-07-04 LAB — URINALYSIS, COMPLETE (UACMP) WITH MICROSCOPIC
Bacteria, UA: NONE SEEN
Bilirubin Urine: NEGATIVE
Glucose, UA: NEGATIVE mg/dL
Hgb urine dipstick: NEGATIVE
Ketones, ur: NEGATIVE mg/dL
Nitrite: NEGATIVE
Protein, ur: NEGATIVE mg/dL
Specific Gravity, Urine: 1.023 (ref 1.005–1.030)
WBC, UA: >50 WBC/hpf — ABNORMAL HIGH (ref 0–5)
pH: 6 (ref 5.0–8.0)

## 2020-07-04 LAB — COMPREHENSIVE METABOLIC PANEL
ALT: 21 U/L (ref 0–44)
AST: 25 U/L (ref 15–41)
Albumin: 4 g/dL (ref 3.5–5.0)
Alkaline Phosphatase: 115 U/L (ref 38–126)
Anion gap: 10 (ref 5–15)
BUN: 22 mg/dL (ref 8–23)
CO2: 29 mmol/L (ref 22–32)
Calcium: 9.3 mg/dL (ref 8.9–10.3)
Chloride: 101 mmol/L (ref 98–111)
Creatinine, Ser: 0.83 mg/dL (ref 0.44–1.00)
GFR, Estimated: >60 mL/min (ref >60)
Glucose, Bld: 114 mg/dL — ABNORMAL HIGH (ref 70–99)
Potassium: 3.9 mmol/L (ref 3.5–5.1)
Sodium: 140 mmol/L (ref 135–145)
Total Bilirubin: 0.7 mg/dL (ref 0.3–1.2)
Total Protein: 7.8 g/dL (ref 6.5–8.1)

## 2020-07-04 LAB — CBC
HCT: 41 % (ref 36.0–46.0)
Hemoglobin: 13.5 g/dL (ref 12.0–15.0)
MCH: 30.4 pg (ref 26.0–34.0)
MCHC: 32.9 g/dL (ref 30.0–36.0)
MCV: 92.3 fL (ref 80.0–100.0)
Platelets: 253 10*3/uL (ref 150–400)
RBC: 4.44 MIL/uL (ref 3.87–5.11)
RDW: 13.9 % (ref 11.5–15.5)
WBC: 11.3 10*3/uL — ABNORMAL HIGH (ref 4.0–10.5)
nRBC: 0 % (ref 0.0–0.2)

## 2020-07-04 LAB — LIPASE, BLOOD: Lipase: 36 U/L (ref 11–51)

## 2020-07-04 MED ORDER — IOHEXOL 9 MG/ML PO SOLN
500.0000 mL | Freq: Two times a day (BID) | ORAL | Status: DC | PRN
  Administered 2020-07-04: 500 mL via ORAL
  Filled 2020-07-04 (×2): qty 500

## 2020-07-04 MED ORDER — PROMETHAZINE HCL 25 MG PO TABS
12.5000 mg | ORAL_TABLET | Freq: Once | ORAL | Status: AC
  Administered 2020-07-04: 12.5 mg via ORAL
  Filled 2020-07-04: qty 1

## 2020-07-04 MED ORDER — IOHEXOL 300 MG/ML  SOLN
80.0000 mL | Freq: Once | INTRAMUSCULAR | Status: AC | PRN
  Administered 2020-07-04: 80 mL via INTRAVENOUS
  Filled 2020-07-04: qty 80

## 2020-07-04 MED ORDER — FAMOTIDINE 20 MG PO TABS: 20.0000 mg | ORAL_TABLET | Freq: Every day | ORAL | Status: DC

## 2020-07-04 MED ORDER — PROMETHAZINE HCL 12.5 MG PO TABS: 12.5000 mg | ORAL_TABLET | Freq: Three times a day (TID) | ORAL | 0 refills | Status: DC | PRN

## 2020-07-04 MED ORDER — OMEPRAZOLE 20 MG PO CPDR: 20.0000 mg | DELAYED_RELEASE_CAPSULE | Freq: Every day | ORAL | 0 refills | Status: DC

## 2020-07-04 NOTE — Discharge Instructions (Addendum)
You were seen today for nausea, epigastric pain, reflux and hiatal hernia.  Your labs were unremarkable.  The CT of your abdomen and pelvis did not show any acute findings.  I have discontinued your Zofran and sent in a prescription for Phenergan.  Please be aware that this may cause sedation.  Please continue your famotidine at bedtime.  I am adding omeprazole in the morning.  Please discuss with your dermatologist to see if methotrexate could be causing your nausea.  You may follow-up with your PCP or GI if symptoms persist or worsen for further evaluation.

## 2020-07-04 NOTE — ED Notes (Signed)
Pt verbalized understanding of d/c instructions at this time. Pt was provided the opportunity to ask questions as needed. Pt assisted to ED entrance and into POV by Faith EDT at this time,tolerated well, NAD noted, RR even and unlabored.

## 2020-07-04 NOTE — ED Provider Notes (Signed)
[facility] Emergency Department Provider Note ____________________________________________  Time seen: 1150  I have reviewed the triage vital signs and the nursing notes.  HISTORY  Chief Complaint  Nausea   HPI [name] is a 87 y.o. female presents to the ER today with complaint of nausea.  She reports this started 3 weeks ago.  She reports it is not constant but does occur on a daily basis.  She reports associated squeezing sensation in her upper abdomen.  This pain does not radiate.  She denies vomiting, gassiness, bloating, constipation, diarrhea or blood in her stool.  She does report a history of a hiatal hernia.  She saw her PCP for the same 4/26.  It was felt that the amiodarone was causing her nausea so this was discontinued, however the nausea persisted.  She was prescribed Zofran which she did feel like was helpful in management of her nausea in the beginning but does not feel like it is helpful at this time.  She is taking Famotidine 20 mg twice daily.  She did finish antibiotics 1 week ago for a UTI, hospital admission 4/15 for the same.  She denies any urinary or vaginal complaints at this time.  She also reports recent admission for left hip fracture 4/1.  Past Medical History:  Diagnosis Date  . Allergy   . Bronchiectasis ([place])   . Cancer [facility])    left breast cancer   . GERD (gastroesophageal reflux disease)   . Hyperlipemia   . Hypertension   . Intractable nausea and vomiting 06/20/2018    Patient Active Problem List   Diagnosis Date Noted  . Acute metabolic encephalopathy 80/99/8338  . Foley catheter in place on admission 06/19/2020  . AF (paroxysmal atrial fibrillation) ([place]) 06/19/2020  . Atrial fibrillation with rapid ventricular response ([place]) 06/09/2020  . Fracture of femoral neck, left, closed ([place]) 06/05/2020  . Acute lower UTI 06/05/2020  . Atherosclerosis of aorta ([place]) 01/09/2020  . Stage 2 moderate COPD by GOLD classification  ([place]) 01/09/2020  . HSV (herpes simplex virus) infection 01/09/2020  . BCC (basal cell carcinoma), face 10/12/2017  . Environmental and seasonal allergies 10/30/2014  . Psoriasis vulgaris 10/30/2014  . Essential (primary) hypertension 10/30/2014  . Gastro-esophageal reflux disease without esophagitis 10/30/2014  . Hot flash, menopausal 10/30/2014  . Hyperlipidemia, mixed 10/30/2014  . History of cancer of left breast 10/30/2014  . Bronchiectasis ([place]) 09/24/2013    Past Surgical History:  Procedure Laterality Date  . BREAST LUMPECTOMY Left 2008  . CHOLECYSTECTOMY    . HIP PINNING,CANNULATED Left 06/05/2020   Procedure: CANNULATED HIP PINNING;  Surgeon: Hessie Knows, MD;  Location: [facility] ORS;  Service: Orthopedics;  Laterality: Left;  . PARTIAL HYSTERECTOMY    . TEE WITHOUT CARDIOVERSION N/A 06/10/2020   Procedure: TRANSESOPHAGEAL ECHOCARDIOGRAM (TEE);  Surgeon: Kate Sable, MD;  Location: [facility] ORS;  Service: Cardiovascular;  Laterality: N/A;    Prior to Admission medications   Medication Sig Start Date End Date Taking? Authorizing Provider  omeprazole (PRILOSEC) 20 MG capsule Take 1 capsule (20 mg total) by mouth daily. 07/04/20  Yes Jearld Fenton, NP  promethazine (PHENERGAN) 12.5 MG tablet Take 1 tablet (12.5 mg total) by mouth every 8 (eight) hours as needed for nausea or vomiting. 07/04/20  Yes Kimberlyann Hollar, Coralie Keens, NP  apixaban (ELIQUIS) 5 MG TABS tablet Take 1 tablet (5 mg total) by mouth 2 (two) times daily. 06/24/20 07/24/20  [name], MD  ascorbic acid (VITAMIN C) 500  MG tablet Take 1 tablet (500 mg total) by mouth daily. 06/12/20   Sidney Ace, MD  atorvastatin (LIPITOR) 20 MG tablet Take 1 tablet (20 mg total) by mouth daily. 06/12/20   Sidney Ace, MD  calcium-vitamin D (OSCAL WITH D) 500-200 MG-UNIT tablet Take 1 tablet by mouth daily. 06/11/20   Sidney Ace, MD  cetirizine (ZYRTEC) 10 MG tablet Take 1 tablet (10 mg total) by mouth daily. 06/11/20    Sidney Ace, MD  diclofenac Sodium (VOLTAREN) 1 % GEL Apply 2 g topically 4 (four) times daily. 06/11/20   Sidney Ace, MD  famotidine (PEPCID) 20 MG tablet Take 1 tablet (20 mg total) by mouth at bedtime. PRN reflux 07/04/20   Jearld Fenton, NP  feeding supplement (ENSURE ENLIVE / ENSURE PLUS) LIQD Take 237 mLs by mouth 3 (three) times daily between meals. 06/24/20   [name], MD  fluticasone (FLONASE) 50 MCG/ACT nasal spray SHAKE LIQUID AND USE 2 SPRAYS IN EACH NOSTRIL DAILY AS NEEDED FOR ALLERGIES OR RHINITIS 06/05/20   Glean Hess, MD  folic acid (FOLVITE) 1 MG tablet Take 1 mg by mouth daily. Takes every day expect Tuesday    [provider]  furosemide (LASIX) 20 MG tablet Take 1 tablet (20 mg total) by mouth as needed. For leg swelling 06/24/20   [name], MD  gabapentin (NEURONTIN) 100 MG capsule Take 100 mg by mouth daily. 05/21/20   [provider]  hydrochlorothiazide (MICROZIDE) 12.5 MG capsule Take 12.5 mg by mouth daily.    [provider]  methotrexate (RHEUMATREX) 2.5 MG tablet Take 10 mg by mouth once a week. Tuesdays    [provider]  metoprolol tartrate (LOPRESSOR) 25 MG tablet Take 1 tablet (25 mg total) by mouth 2 (two) times daily. 07/01/20 09/29/20  Furth, Cadence H, PA-C  Multiple Vitamin (MULTIVITAMIN ADULT) TABS Take 1 tablet by mouth daily. 06/11/20   Sreenath, Trula Slade, MD  Omega-3 Fatty Acids (FISH OIL) 1000 MG CAPS Take 1 capsule (1,000 mg total) by mouth daily. 06/11/20   Sreenath, Trula Slade, MD  senna-docusate (SENOKOT-S) 8.6-50 MG tablet Take 1 tablet by mouth 2 (two) times daily. 06/11/20   Sidney Ace, MD  vitamin B-12 (CYANOCOBALAMIN) 1000 MCG tablet Take 1 tablet (1,000 mcg total) by mouth daily. 06/11/20   Sidney Ace, MD    Allergies Patient has no known allergies.  Family History  Problem Relation Age of Onset  . Breast cancer Sister     Social History Social History   Tobacco Use   . Smoking status: Never Smoker  . Smokeless tobacco: Never Used  Vaping Use  . Vaping Use: Never used  Substance Use Topics  . Alcohol use: No  . Drug use: No    Review of Systems  Constitutional: Negative for fever, chills or body aches. Eyes: Negative for visual changes. ENT: Negative for runny nose, nasal congestion, ear pain or sore throat. Cardiovascular: Negative for chest pain or chest tightness. Respiratory: Negative for cough or shortness of breath. Gastrointestinal: Positive for abdominal pain and nausea.  Negative for bloating, gassiness, vomiting, constipation, diarrhea or blood in her stool. Genitourinary: Negative for urinary urgency, frequency, dysuria or blood in her urine. Musculoskeletal: Negative for back pain. Skin: Negative for rash. Neurological: Negative for headaches, focal weakness, tingling or numbness. ____________________________________________  PHYSICAL EXAM:  VITAL SIGNS: ED Triage Vitals  Enc Vitals Group     BP 07/04/20 2620 Marland Kitchen)  172/70     Pulse Rate 07/04/20 0928 69     Resp 07/04/20 0928 16     Temp 07/04/20 0928 97.8 F (36.6 C)     Temp Source 07/04/20 0928 Oral     SpO2 07/04/20 0928 95 %     Weight 07/04/20 0926 123 lb 7.3 oz (56 kg)     Height 07/04/20 0926 5\' 3"  (1.6 m)     Head Circumference --      Peak Flow --      Pain Score 07/04/20 0926 0     Pain Loc --      Pain Edu? --      Excl. in [place]? --     Constitutional: Alert and oriented. Well appearing and in no distress. Head: Normocephalic. Eyes: Sclera white normal extraocular movements Neck: Supple. No thyromegaly. Hematological/Lymphatic/Immunological: No cervical lymphadenopathy. Cardiovascular: Normal rate, regular rhythm.  Respiratory: Normal respiratory effort. No wheezes/rales/rhonchi noted. Gastrointestinal: Soft, tender in the epigastric region and right lower quadrant.  Active bowel sounds.  No distention or masses noted.  No CVA tenderness noted. Neurologic:   Normal gait without ataxia. Normal speech and language. No gross focal neurologic deficits are appreciated. Skin:  Skin is warm, dry and intact. No rash noted.  ____________________________________________   LABS Labs Reviewed  COMPREHENSIVE METABOLIC PANEL - Abnormal; Notable for the following components:      Result Value   Glucose, Bld 114 (*)    All other components within normal limits  CBC - Abnormal; Notable for the following components:   WBC 11.3 (*)    All other components within normal limits  URINALYSIS, COMPLETE (UACMP) WITH MICROSCOPIC - Abnormal; Notable for the following components:   Color, Urine YELLOW (*)    APPearance HAZY (*)    Leukocytes,Ua LARGE (*)    WBC, UA >50 (*)    All other components within normal limits  URINE CULTURE  LIPASE, BLOOD    ____________________________________________  EKG ED ECG REPORT   Date: 07/04/2020  EKG Time: 3:17 PM  Rate: 69  Rhythm: Normal sinus rhythm, unchanged from ECG 07/01/20  Axis: normal  Intervals: no conduction defects  ST&T Change: no ST change  Narrative Interpretation: Normal ECG  ____________________________________________   RADIOLOGY  Imaging Orders     CT ABDOMEN PELVIS W CONTRAST IMPRESSION: 1. No acute abnormality in the abdomen or pelvis. 2. Mild intrahepatic and extrahepatic biliary dilatation is likely secondary to cholecystectomy. Recommend correlating with liver enzymes. 3. Mild fullness of the renal collecting systems likely related to bladder distension. No significant hydronephrosis. Bilateral renal cysts. 4. Small hiatal hernia.   ____________________________________________   INITIAL IMPRESSION / ASSESSMENT AND PLAN / ED COURSE  Intractable Nausea, Epigastric Pain, RLQ Pain, GERD, Hx of Hiatal Hernia:  DDX include GERD, gastritis, complication from hiatal hernia, appendicitis, unresolved UTI. CMET normal CBC shows slightly elevated white count Urinalysis with large leukocytes and  mucous- could be contamination Will add on urine culture ECG shows no acute findings Promethazine 12.5 mg PO x 1 CT abd/pelvis with contrast shows no acute findings Will have her continue Famotidine 20 mg at bedtime RX for Omeprazole 20 mg PO in am D/C Zofran RX for Promethazine 12.5 mg Q8H prn- sedation caution given Advised MTX could be causing nausea, she will discuss this with her dermatologist Will have her follow up with GI as an outpatient  ____________________________________________  FINAL CLINICAL IMPRESSION(S) / ED DIAGNOSES  Final diagnoses:  Nausea  Epigastric pain  Gastroesophageal reflux disease without esophagitis  Hiatal hernia      Jearld Fenton, NP 07/04/20 1517    Vanessa Ali Chukson, MD 07/06/20 [phone]

## 2020-07-04 NOTE — ED Triage Notes (Signed)
Pt to ED POV with husband for nausea x3 weeks. Denies vomiting or diarrhea. Denies urinary sx. Finished antibiotic for UTI. Alert and oriented.  Last BM this am

## 2020-07-04 NOTE — ED Notes (Signed)
Pt assisted in using bedside commode. Urine sample collected. Pt then assisted into stretcher, blankets provided.

## 2020-07-06 DIAGNOSIS — K219 Gastro-esophageal reflux disease without esophagitis: Secondary | ICD-10-CM | POA: Diagnosis not present

## 2020-07-06 DIAGNOSIS — K5900 Constipation, unspecified: Secondary | ICD-10-CM | POA: Diagnosis not present

## 2020-07-06 DIAGNOSIS — S72012D Unspecified intracapsular fracture of left femur, subsequent encounter for closed fracture with routine healing: Secondary | ICD-10-CM | POA: Diagnosis not present

## 2020-07-06 DIAGNOSIS — E876 Hypokalemia: Secondary | ICD-10-CM | POA: Diagnosis not present

## 2020-07-06 DIAGNOSIS — E785 Hyperlipidemia, unspecified: Secondary | ICD-10-CM | POA: Diagnosis not present

## 2020-07-06 DIAGNOSIS — I10 Essential (primary) hypertension: Secondary | ICD-10-CM | POA: Diagnosis not present

## 2020-07-06 DIAGNOSIS — I480 Paroxysmal atrial fibrillation: Secondary | ICD-10-CM | POA: Diagnosis not present

## 2020-07-06 DIAGNOSIS — J479 Bronchiectasis, uncomplicated: Secondary | ICD-10-CM | POA: Diagnosis not present

## 2020-07-06 LAB — URINE CULTURE

## 2020-07-07 DIAGNOSIS — S72002A Fracture of unspecified part of neck of left femur, initial encounter for closed fracture: Secondary | ICD-10-CM | POA: Diagnosis not present

## 2020-07-09 ENCOUNTER — Telehealth: Payer: Self-pay

## 2020-07-09 ENCOUNTER — Emergency Department: Payer: Medicare PPO

## 2020-07-09 ENCOUNTER — Emergency Department
Admission: EM | Admit: 2020-07-09 | Discharge: 2020-07-09 | Disposition: A | Discharge status: Home / Self Care | Payer: Medicare PPO | Attending: Emergency Medicine | Admitting: Emergency Medicine

## 2020-07-09 ENCOUNTER — Other Ambulatory Visit: Payer: Self-pay

## 2020-07-09 DIAGNOSIS — Z853 Personal history of malignant neoplasm of breast: Secondary | ICD-10-CM | POA: Insufficient documentation

## 2020-07-09 DIAGNOSIS — Z90710 Acquired absence of both cervix and uterus: Secondary | ICD-10-CM | POA: Diagnosis not present

## 2020-07-09 DIAGNOSIS — Z79899 Other long term (current) drug therapy: Secondary | ICD-10-CM | POA: Diagnosis not present

## 2020-07-09 DIAGNOSIS — I10 Essential (primary) hypertension: Secondary | ICD-10-CM | POA: Insufficient documentation

## 2020-07-09 DIAGNOSIS — J449 Chronic obstructive pulmonary disease, unspecified: Secondary | ICD-10-CM | POA: Insufficient documentation

## 2020-07-09 DIAGNOSIS — R112 Nausea with vomiting, unspecified: Secondary | ICD-10-CM | POA: Diagnosis not present

## 2020-07-09 DIAGNOSIS — Z9221 Personal history of antineoplastic chemotherapy: Secondary | ICD-10-CM | POA: Diagnosis not present

## 2020-07-09 DIAGNOSIS — Z7901 Long term (current) use of anticoagulants: Secondary | ICD-10-CM | POA: Diagnosis not present

## 2020-07-09 DIAGNOSIS — Z923 Personal history of irradiation: Secondary | ICD-10-CM | POA: Diagnosis not present

## 2020-07-09 DIAGNOSIS — R110 Nausea: Secondary | ICD-10-CM | POA: Insufficient documentation

## 2020-07-09 DIAGNOSIS — Z7982 Long term (current) use of aspirin: Secondary | ICD-10-CM | POA: Diagnosis not present

## 2020-07-09 LAB — LIPASE, BLOOD: Lipase: 36 U/L (ref 11–51)

## 2020-07-09 LAB — PROCALCITONIN: Procalcitonin: <0.1 ng/mL

## 2020-07-09 LAB — COMPREHENSIVE METABOLIC PANEL
ALT: 17 U/L (ref 0–44)
AST: 24 U/L (ref 15–41)
Albumin: 3.9 g/dL (ref 3.5–5.0)
Alkaline Phosphatase: 98 U/L (ref 38–126)
Anion gap: 12 (ref 5–15)
BUN: 18 mg/dL (ref 8–23)
CO2: 24 mmol/L (ref 22–32)
Calcium: 9.3 mg/dL (ref 8.9–10.3)
Chloride: 104 mmol/L (ref 98–111)
Creatinine, Ser: 0.76 mg/dL (ref 0.44–1.00)
GFR, Estimated: >60 mL/min (ref >60)
Glucose, Bld: 123 mg/dL — ABNORMAL HIGH (ref 70–99)
Potassium: 3.8 mmol/L (ref 3.5–5.1)
Sodium: 140 mmol/L (ref 135–145)
Total Bilirubin: 0.7 mg/dL (ref 0.3–1.2)
Total Protein: 7.5 g/dL (ref 6.5–8.1)

## 2020-07-09 LAB — CBC
HCT: 42.4 % (ref 36.0–46.0)
Hemoglobin: 14.1 g/dL (ref 12.0–15.0)
MCH: 30.2 pg (ref 26.0–34.0)
MCHC: 33.3 g/dL (ref 30.0–36.0)
MCV: 90.8 fL (ref 80.0–100.0)
Platelets: 243 10*3/uL (ref 150–400)
RBC: 4.67 MIL/uL (ref 3.87–5.11)
RDW: 13.8 % (ref 11.5–15.5)
WBC: 13.4 10*3/uL — ABNORMAL HIGH (ref 4.0–10.5)
nRBC: 0 % (ref 0.0–0.2)

## 2020-07-09 LAB — URINALYSIS, COMPLETE (UACMP) WITH MICROSCOPIC
Bacteria, UA: NONE SEEN
Bilirubin Urine: NEGATIVE
Glucose, UA: NEGATIVE mg/dL
Hgb urine dipstick: NEGATIVE
Ketones, ur: NEGATIVE mg/dL
Nitrite: NEGATIVE
Protein, ur: NEGATIVE mg/dL
Specific Gravity, Urine: 1.015 (ref 1.005–1.030)
pH: 7 (ref 5.0–8.0)

## 2020-07-09 LAB — LACTIC ACID, PLASMA: Lactic Acid, Venous: 2 mmol/L (ref 0.5–1.9)

## 2020-07-09 LAB — TROPONIN I (HIGH SENSITIVITY): Troponin I (High Sensitivity): 6 ng/L (ref <18)

## 2020-07-09 MED ORDER — METOCLOPRAMIDE HCL 10 MG PO TABS: 10.0000 mg | ORAL_TABLET | Freq: Three times a day (TID) | ORAL | 0 refills | Status: DC

## 2020-07-09 MED ORDER — LACTATED RINGERS IV BOLUS
1000.0000 mL | Freq: Once | INTRAVENOUS | Status: AC
  Administered 2020-07-09: 1000 mL via INTRAVENOUS

## 2020-07-09 NOTE — ED Notes (Signed)
BIB family due to nausea for ongoing weeks.

## 2020-07-09 NOTE — Telephone Encounter (Signed)
Copied from [place] [id number]. Topic: General - Other >> Jul 09, 2020  8:34 AM Celene Kras wrote: Reason for CRM: Pts husband calling stating that the pt is still very sick after the appt from 06/30/20 with PCP. He states that pt cannot eat, dry heaving, and very weak. He is requesting to have an appt today. Spoke with Daisy at office and was told to send CRM. Please advise.

## 2020-07-09 NOTE — Telephone Encounter (Signed)
Called pt spoke to her husband Mr. Bies. Told him that pt needs to go to the ER due to symptoms. Pts husbands stated "yea she has been thru that route several times bye." Pt abruptly hung up the phone.   KP

## 2020-07-09 NOTE — ED Notes (Signed)
Lab notified to add on Troponin. 

## 2020-07-09 NOTE — ED Provider Notes (Signed)
[facility] Emergency Department Provider Note  ____________________________________________   Event Date/Time   First MD Initiated Contact with Patient 07/09/20 1108     (approximate)  I have reviewed the triage vital signs and the nursing notes.   HISTORY  Chief Complaint Nausea   HPI [name] is a 87 y.o. female with past medical history of bronchiectasis, left breast cancer, HTN, HDL paroxysmal A. fib on Eliquis and metoprolol who presents for assessment of approximately 3 weeks of nausea.  Patient states she was seen couple days ago emergency room and had a CT PE and that was normal and she has been taking Zofran and Phenergan but she is still feeling nauseous.  She has been able to drink fluids and has been able to eat a little bit including little chicken yesterday but her nausea has not gone away.  She denies any associated chest pain, change in chronic cough, significant abdominal pain, diarrhea, constipation, urinary symptoms, headache, earache, sore throat, back pain, rash or extremity pain.  She does not fall in the last couple days and does not endorse any other clear alleviating aggravating factors.  Does not clearly related to p.o. intake.  Denies EtOH or drug use or NSAID use.  No other acute concerns at this time.         Past Medical History:  Diagnosis Date  . Allergy   . Bronchiectasis ([place])   . Cancer [facility])    left breast cancer   . GERD (gastroesophageal reflux disease)   . Hyperlipemia   . Hypertension   . Intractable nausea and vomiting 06/20/2018    Patient Active Problem List   Diagnosis Date Noted  . Acute metabolic encephalopathy 99991111  . Foley catheter in place on admission 06/19/2020  . AF (paroxysmal atrial fibrillation) ([place]) 06/19/2020  . Atrial fibrillation with rapid ventricular response ([place]) 06/09/2020  . Fracture of femoral neck, left, closed ([place]) 06/05/2020  . Acute lower UTI 06/05/2020  .  Atherosclerosis of aorta ([employer]) 01/09/2020  . Stage 2 moderate COPD by GOLD classification ([place]) 01/09/2020  . HSV (herpes simplex virus) infection 01/09/2020  . BCC (basal cell carcinoma), face 10/12/2017  . Environmental and seasonal allergies 10/30/2014  . Psoriasis vulgaris 10/30/2014  . Essential (primary) hypertension 10/30/2014  . Gastro-esophageal reflux disease without esophagitis 10/30/2014  . Hot flash, menopausal 10/30/2014  . Hyperlipidemia, mixed 10/30/2014  . History of cancer of left breast 10/30/2014  . Bronchiectasis ([place]) 09/24/2013    Past Surgical History:  Procedure Laterality Date  . BREAST LUMPECTOMY Left 2008  . CHOLECYSTECTOMY    . HIP PINNING,CANNULATED Left 06/05/2020   Procedure: CANNULATED HIP PINNING;  Surgeon: Hessie Knows, MD;  Location: [facility] ORS;  Service: Orthopedics;  Laterality: Left;  . PARTIAL HYSTERECTOMY    . TEE WITHOUT CARDIOVERSION N/A 06/10/2020   Procedure: TRANSESOPHAGEAL ECHOCARDIOGRAM (TEE);  Surgeon: Kate Sable, MD;  Location: [facility] ORS;  Service: Cardiovascular;  Laterality: N/A;    Prior to Admission medications   Medication Sig Start Date End Date Taking? Authorizing Provider  apixaban (ELIQUIS) 5 MG TABS tablet Take 1 tablet (5 mg total) by mouth 2 (two) times daily. 06/24/20 07/24/20  [name], MD  ascorbic acid (VITAMIN C) 500 MG tablet Take 1 tablet (500 mg total) by mouth daily. 06/12/20   Sidney Ace, MD  atorvastatin (LIPITOR) 20 MG tablet Take 1 tablet (20 mg total) by mouth daily. 06/12/20   Sidney Ace, MD  calcium-vitamin D (  OSCAL WITH D) 500-200 MG-UNIT tablet Take 1 tablet by mouth daily. 06/11/20   Sidney Ace, MD  cetirizine (ZYRTEC) 10 MG tablet Take 1 tablet (10 mg total) by mouth daily. 06/11/20   Sidney Ace, MD  diclofenac Sodium (VOLTAREN) 1 % GEL Apply 2 g topically 4 (four) times daily. 06/11/20   Sidney Ace, MD  famotidine (PEPCID) 20 MG tablet Take 1 tablet (20 mg total)  by mouth at bedtime. PRN reflux 07/04/20   Jearld Fenton, NP  feeding supplement (ENSURE ENLIVE / ENSURE PLUS) LIQD Take 237 mLs by mouth 3 (three) times daily between meals. 06/24/20   [name], MD  fluticasone (FLONASE) 50 MCG/ACT nasal spray SHAKE LIQUID AND USE 2 SPRAYS IN EACH NOSTRIL DAILY AS NEEDED FOR ALLERGIES OR RHINITIS 06/05/20   Glean Hess, MD  folic acid (FOLVITE) 1 MG tablet Take 1 mg by mouth daily. Takes every day expect Tuesday    [provider]  furosemide (LASIX) 20 MG tablet Take 1 tablet (20 mg total) by mouth as needed. For leg swelling 06/24/20   [name], MD  gabapentin (NEURONTIN) 100 MG capsule Take 100 mg by mouth daily. 05/21/20   [provider]  hydrochlorothiazide (MICROZIDE) 12.5 MG capsule Take 12.5 mg by mouth daily.    [provider]  methotrexate (RHEUMATREX) 2.5 MG tablet Take 10 mg by mouth once a week. Tuesdays    [provider]  metoprolol tartrate (LOPRESSOR) 25 MG tablet Take 1 tablet (25 mg total) by mouth 2 (two) times daily. 07/01/20 09/29/20  Furth, Cadence H, PA-C  Multiple Vitamin (MULTIVITAMIN ADULT) TABS Take 1 tablet by mouth daily. 06/11/20   Sreenath, Trula Slade, MD  Omega-3 Fatty Acids (FISH OIL) 1000 MG CAPS Take 1 capsule (1,000 mg total) by mouth daily. 06/11/20   Sidney Ace, MD  omeprazole (PRILOSEC) 20 MG capsule Take 1 capsule (20 mg total) by mouth daily. 07/04/20   Jearld Fenton, NP  promethazine (PHENERGAN) 12.5 MG tablet Take 1 tablet (12.5 mg total) by mouth every 8 (eight) hours as needed for nausea or vomiting. 07/04/20   Jearld Fenton, NP  senna-docusate (SENOKOT-S) 8.6-50 MG tablet Take 1 tablet by mouth 2 (two) times daily. 06/11/20   Sidney Ace, MD  vitamin B-12 (CYANOCOBALAMIN) 1000 MCG tablet Take 1 tablet (1,000 mcg total) by mouth daily. 06/11/20   Sidney Ace, MD    Allergies Patient has no known allergies.  Family History  Problem Relation Age of Onset   . Breast cancer Sister     Social History Social History   Tobacco Use  . Smoking status: Never Smoker  . Smokeless tobacco: Never Used  Vaping Use  . Vaping Use: Never used  Substance Use Topics  . Alcohol use: No  . Drug use: No    Review of Systems  Review of Systems  Constitutional: Negative for chills and fever.  HENT: Negative for sore throat.   Eyes: Negative for pain.  Respiratory: Negative for cough and stridor.   Cardiovascular: Negative for chest pain.  Gastrointestinal: Positive for nausea. Negative for vomiting.  Genitourinary: Negative for dysuria.  Musculoskeletal: Negative for myalgias.  Skin: Negative for rash.  Neurological: Negative for seizures, loss of consciousness and headaches.  Psychiatric/Behavioral: Negative for suicidal ideas.  All other systems reviewed and are negative.     ____________________________________________   PHYSICAL EXAM:  VITAL SIGNS: ED Triage Vitals  Enc Vitals Group  BP 07/09/20 1000 (!) 168/62     Pulse Rate 07/09/20 1000 75     Resp 07/09/20 1000 16     Temp 07/09/20 1000 98.2 F (36.8 C)     Temp Source 07/09/20 1000 Oral     SpO2 07/09/20 1000 94 %     Weight 07/09/20 1001 121 lb (54.9 kg)     Height 07/09/20 1001 5\' 3"  (1.6 m)     Head Circumference --      Peak Flow --      Pain Score 07/09/20 1000 0     Pain Loc --      Pain Edu? --      Excl. in [place]? --    Vitals:   07/09/20 1230 07/09/20 1330  BP: (!) 145/71 (!) 144/60  Pulse: 80 79  Resp: 17 15  Temp:    SpO2: 94% 94%   Physical Exam Vitals and nursing note reviewed.  Constitutional:      General: She is not in acute distress.    Appearance: She is well-developed.  HENT:     Head: Normocephalic and atraumatic.     Right Ear: External ear normal.     Left Ear: External ear normal.     Nose: Nose normal.     Mouth/Throat:     Mouth: Mucous membranes are moist.  Eyes:     Conjunctiva/sclera: Conjunctivae normal.  Cardiovascular:      Rate and Rhythm: Normal rate and regular rhythm.     Heart sounds: No murmur heard.   Pulmonary:     Effort: Pulmonary effort is normal. No respiratory distress.     Breath sounds: Normal breath sounds.  Abdominal:     Palpations: Abdomen is soft.     Tenderness: There is no abdominal tenderness.  Musculoskeletal:     Cervical back: Neck supple.  Skin:    General: Skin is warm and dry.     Capillary Refill: Capillary refill takes less than 2 seconds.  Neurological:     General: No focal deficit present.     Mental Status: She is alert and oriented to person, place, and time.  Psychiatric:        Mood and Affect: Mood normal.      ____________________________________________   LABS (all labs ordered are listed, but only abnormal results are displayed)  Labs Reviewed  COMPREHENSIVE METABOLIC PANEL - Abnormal; Notable for the following components:      Result Value   Glucose, Bld 123 (*)    All other components within normal limits  CBC - Abnormal; Notable for the following components:   WBC 13.4 (*)    All other components within normal limits  URINALYSIS, COMPLETE (UACMP) WITH MICROSCOPIC - Abnormal; Notable for the following components:   Color, Urine YELLOW (*)    APPearance CLEAR (*)    Leukocytes,Ua SMALL (*)    All other components within normal limits  LACTIC ACID, PLASMA - Abnormal; Notable for the following components:   Lactic Acid, Venous 2.0 (*)    All other components within normal limits  URINE CULTURE  LIPASE, BLOOD  PROCALCITONIN  LACTIC ACID, PLASMA  TROPONIN I (HIGH SENSITIVITY)   ____________________________________________  EKG  Sinus rhythm with a ventricular to 79 without evidence of acute ischemia or other significant underlying arrhythmia.  ____________________________________________  RADIOLOGY  ED MD interpretation: No clear for consolidation, effusion, significant hemopneumothorax or any other clear acute thoracic process.    Official radiology report(s): DG  Chest 2 View  Result Date: 07/09/2020 CLINICAL DATA:  Nausea and decreased appetite for approximately 3 weeks. EXAM: CHEST - 2 VIEW COMPARISON:  And single-view of the chest 06/19/2020 in 06/07/2020. CT chest 06/08/2020. FINDINGS: The lungs are clear. Heart size is normal. No pneumothorax or pleural fluid. Aortic atherosclerosis. No acute or focal bony abnormality. IMPRESSION: No acute disease. Aortic Atherosclerosis (ICD10-I70.0). Electronically Signed   By: Inge Rise M.D.   On: 07/09/2020 13:47    ____________________________________________   PROCEDURES  Procedure(s) performed (including Critical Care):  .1-3 Lead EKG Interpretation Performed by: Lucrezia Starch, MD Authorized by: Lucrezia Starch, MD     Interpretation: normal     ECG rate assessment: normal     Rhythm: sinus rhythm     Ectopy: none     Conduction: normal       ____________________________________________   INITIAL IMPRESSION / ASSESSMENT AND PLAN / ED COURSE        Patient presents with above-stated history exam for assessment approximately 3 weeks of some nausea without vomiting constipation or any significant associated pain.  She has been able to tolerate p.o.  On arrival she is hypertensive with a BP of 157/62 with otherwise stable vital signs on room air.  She does not appear significantly dehydrated with moist mucous membranes normal cap refill and her abdomen is soft nontender throughout.  Differential includes possible gastritis either erosive or infectious, metabolic derangements, symptomatic arrhythmia, anemia, and UTI.  She has not had any recent falls in last week or 2.  She does not have any headache any focal deficits to suggest CVA or acute intracranial hemorrhage.  She is with her husband and has no symptoms and Evalose patient for car monoxide poisoning.  She has no abdominal pain and constipation to suggest SBO.  Did review her CT obtained on her  ED visit on 4/30 which showed no evidence of kidney stones a small hiatal hernia and some mild intrahepatic and extrahepatic biliary dilation was thought to be secondary to her known cholecystectomy.  BMP today shows no significant electrolyte or metabolic derangements.  No evidence of kidney injury, cholestasis or hepatitis.  Lipase is not consistent with acute pancreatitis.  EKG and troponin are not consistent with atypical presentation for ACS.  CBC shows leukocytosis 13.4 which is somewhat nonspecific without acute anemia or abnormal platelets.  UA remarkable for small leukocyte esterase and 11-20 WBCs at this time a sterile pyuria and low suspicion for symptomatic cystitis at this time given absence of bacteria nitrites or other findings and UA suggest acute infections.  Lactate is 2 which is at the upper limit of normal the patient has no fever or any obvious other foci of infection on exam i.e. any abdominal tenderness back pain urinary symptoms or other clear foci of acute infectious process.  Given no change in symptoms with CT 5 days ago do not believe repeat CT would be high yield at this time.  Calcitonin is undetectable.  Given stable vitals with otherwise reassuring exam and duration of symptoms with patient tolerating p.o. I think she is safe for continued outpatient management and evaluation.  Splane to patient that she should schedule close follow-up with her PCP and GI.  Strict return precautions advised including development of any pain, vomiting, constipation, fever or any other new associated symptoms.  Discharged stable condition.  Strict return precautions agreed to by patient     ____________________________________________   FINAL CLINICAL IMPRESSION(S) / ED DIAGNOSES  Final diagnoses:  Nausea    Medications  lactated ringers bolus 1,000 mL (1,000 mLs Intravenous New Bag/Given 07/09/20 1325)     ED Discharge Orders    None       Note:  This document was prepared  using Dragon voice recognition software and may include unintentional dictation errors.   Lucrezia Starch, MD 07/09/20 [phone]

## 2020-07-09 NOTE — ED Triage Notes (Signed)
Pt to ER via POV with husband with complaints of nausea and decreased appetite x3 weeks. Pt was previously seen for the same, states she had a CT of her abdomen which was negative. Denies diarrhea or urinary symptoms. No vomiting, dry heaves.

## 2020-07-10 ENCOUNTER — Telehealth: Payer: Self-pay

## 2020-07-10 DIAGNOSIS — E876 Hypokalemia: Secondary | ICD-10-CM | POA: Diagnosis not present

## 2020-07-10 DIAGNOSIS — E785 Hyperlipidemia, unspecified: Secondary | ICD-10-CM | POA: Diagnosis not present

## 2020-07-10 DIAGNOSIS — I10 Essential (primary) hypertension: Secondary | ICD-10-CM | POA: Diagnosis not present

## 2020-07-10 DIAGNOSIS — S72012D Unspecified intracapsular fracture of left femur, subsequent encounter for closed fracture with routine healing: Secondary | ICD-10-CM | POA: Diagnosis not present

## 2020-07-10 DIAGNOSIS — K219 Gastro-esophageal reflux disease without esophagitis: Secondary | ICD-10-CM | POA: Diagnosis not present

## 2020-07-10 DIAGNOSIS — J479 Bronchiectasis, uncomplicated: Secondary | ICD-10-CM | POA: Diagnosis not present

## 2020-07-10 DIAGNOSIS — K5900 Constipation, unspecified: Secondary | ICD-10-CM | POA: Diagnosis not present

## 2020-07-10 DIAGNOSIS — I480 Paroxysmal atrial fibrillation: Secondary | ICD-10-CM | POA: Diagnosis not present

## 2020-07-10 NOTE — Telephone Encounter (Signed)
Patient seen in the Emergency Room yesterday and was referred to Melah Antigua, MD in GI.  Do not see appointment scheduled yet.  For your information.

## 2020-07-10 NOTE — Telephone Encounter (Signed)
Copied from [place] [id number]. Topic: Referral - Request for Referral >> Jul 09, 2020  4:58 PM Yvette Rack wrote: Has patient seen PCP for this complaint? yes *If NO, is insurance requiring patient see PCP for this issue before PCP can refer them? Referral for which specialty: GI Preferred provider/office: [facility] Reason for referral: Pt daughter stated pt having nausea since beginning of April with no answers

## 2020-07-10 NOTE — Telephone Encounter (Signed)
Copied from [place] [id number]. Topic: Referral - Request for Referral >> Jul 09, 2020  4:58 PM Yvette Rack wrote: Has patient seen PCP for this complaint? yes *If NO, is insurance requiring patient see PCP for this issue before PCP can refer them? Referral for which specialty: GI Preferred provider/office: [facility] Reason for referral: Pt daughter stated pt having nausea since beginning of April with no answers >> Jul 10, 2020 11:32 AM Yvette Rack wrote: Pt daughter requests that the referral request be faxed to [facility] GI at [phone]

## 2020-07-10 NOTE — Telephone Encounter (Signed)
Please advise for GI referral.

## 2020-07-11 ENCOUNTER — Other Ambulatory Visit: Payer: Self-pay | Admitting: Internal Medicine

## 2020-07-11 DIAGNOSIS — R112 Nausea with vomiting, unspecified: Secondary | ICD-10-CM

## 2020-07-11 LAB — URINE CULTURE

## 2020-07-11 NOTE — Telephone Encounter (Signed)
Referral to [facility] GI placed but I doubt that they will see her any time soon.

## 2020-07-13 ENCOUNTER — Telehealth: Payer: Self-pay | Admitting: Internal Medicine

## 2020-07-13 DIAGNOSIS — E876 Hypokalemia: Secondary | ICD-10-CM | POA: Diagnosis not present

## 2020-07-13 DIAGNOSIS — K219 Gastro-esophageal reflux disease without esophagitis: Secondary | ICD-10-CM | POA: Diagnosis not present

## 2020-07-13 DIAGNOSIS — I480 Paroxysmal atrial fibrillation: Secondary | ICD-10-CM | POA: Diagnosis not present

## 2020-07-13 DIAGNOSIS — E785 Hyperlipidemia, unspecified: Secondary | ICD-10-CM | POA: Diagnosis not present

## 2020-07-13 DIAGNOSIS — S72012D Unspecified intracapsular fracture of left femur, subsequent encounter for closed fracture with routine healing: Secondary | ICD-10-CM | POA: Diagnosis not present

## 2020-07-13 DIAGNOSIS — I10 Essential (primary) hypertension: Secondary | ICD-10-CM | POA: Diagnosis not present

## 2020-07-13 DIAGNOSIS — J479 Bronchiectasis, uncomplicated: Secondary | ICD-10-CM | POA: Diagnosis not present

## 2020-07-13 DIAGNOSIS — K5900 Constipation, unspecified: Secondary | ICD-10-CM | POA: Diagnosis not present

## 2020-07-13 NOTE — Telephone Encounter (Signed)
Called pt spoke to [name]. Let him know that a referral was placed to Dr. Drema Dallas. He stated he would let pt know.  KP

## 2020-07-13 NOTE — Telephone Encounter (Signed)
Patient's daughter called regarding a referral to a GI specialist.  Daughter would like it sent to Dr. Drema Dallas in [place].  Fax# [phone]

## 2020-07-15 ENCOUNTER — Ambulatory Visit: Payer: Medicare PPO | Admitting: Internal Medicine

## 2020-07-15 ENCOUNTER — Other Ambulatory Visit: Payer: Self-pay

## 2020-07-15 ENCOUNTER — Encounter: Payer: Self-pay | Admitting: Internal Medicine

## 2020-07-15 VITALS — BP 152/84 | HR 87 | Temp 98.2°F | Ht 63.0 in | Wt 122.0 lb

## 2020-07-15 DIAGNOSIS — D6869 Other thrombophilia: Secondary | ICD-10-CM | POA: Insufficient documentation

## 2020-07-15 DIAGNOSIS — I480 Paroxysmal atrial fibrillation: Secondary | ICD-10-CM

## 2020-07-15 DIAGNOSIS — E876 Hypokalemia: Secondary | ICD-10-CM | POA: Diagnosis not present

## 2020-07-15 DIAGNOSIS — R110 Nausea: Secondary | ICD-10-CM | POA: Diagnosis not present

## 2020-07-15 DIAGNOSIS — K5900 Constipation, unspecified: Secondary | ICD-10-CM | POA: Diagnosis not present

## 2020-07-15 DIAGNOSIS — K219 Gastro-esophageal reflux disease without esophagitis: Secondary | ICD-10-CM | POA: Diagnosis not present

## 2020-07-15 DIAGNOSIS — I700 Atherosclerosis of aorta: Secondary | ICD-10-CM

## 2020-07-15 DIAGNOSIS — S72012D Unspecified intracapsular fracture of left femur, subsequent encounter for closed fracture with routine healing: Secondary | ICD-10-CM | POA: Diagnosis not present

## 2020-07-15 DIAGNOSIS — B370 Candidal stomatitis: Secondary | ICD-10-CM | POA: Diagnosis not present

## 2020-07-15 DIAGNOSIS — J479 Bronchiectasis, uncomplicated: Secondary | ICD-10-CM | POA: Diagnosis not present

## 2020-07-15 DIAGNOSIS — E785 Hyperlipidemia, unspecified: Secondary | ICD-10-CM | POA: Diagnosis not present

## 2020-07-15 DIAGNOSIS — I10 Essential (primary) hypertension: Secondary | ICD-10-CM | POA: Diagnosis not present

## 2020-07-15 MED ORDER — NYSTATIN 100000 UNIT/ML MT SUSP
5.0000 mL | Freq: Four times a day (QID) | OROMUCOSAL | 0 refills | Status: DC
Start: 2020-07-15 — End: 2020-07-30

## 2020-07-15 MED ORDER — METOCLOPRAMIDE HCL 10 MG PO TABS: 10.0000 mg | ORAL_TABLET | Freq: Three times a day (TID) | ORAL | 0 refills | Status: DC

## 2020-07-15 NOTE — Progress Notes (Signed)
Date:  07/15/2020   Name:  [name]   DOB:  Mar 18, 1933   MRN:  [id number]   Chief Complaint: No chief complaint on file.  HPI Nausea  Follow up for ED visit for ongoing nausea on 07/09/20.  She was prescribed Reglan and instructed to follow up here.  She has stopped all of her medications for the past week and is not improved. The only one she took was Reglan twice a day.  Last seen by me on 06/30/20 with sx felt to be side effect of amiodarone. Visit summary:  Intractable nausea and vomiting Begin zofran q8h [place] diet recommended - advance as tolerated Hold all non essential supplements for 1-2 weeks - ondansetron (ZOFRAN-ODT) 4 MG disintegrating tablet; Take 1 tablet (4 mg total) by mouth every 8 (eight) hours as needed for nausea or vomiting.  Dispense: 30 tablet; Refill: 0  Patient was seen by cardiology on 07/01/20 and amiodarone was discontinued.  Eliquis 5 mg bid was started along with Metoprolol 25 mg bid.   Lab Results  Component Value Date   CREATININE 0.76 07/09/2020   BUN 18 07/09/2020   NA 140 07/09/2020   K 3.8 07/09/2020   CL 104 07/09/2020   CO2 24 07/09/2020   Lab Results  Component Value Date   CHOL 206 (H) 01/09/2020   HDL 93 01/09/2020   LDLCALC 98 01/09/2020   TRIG 86 01/09/2020   CHOLHDL 2.2 01/09/2020   Lab Results  Component Value Date   TSH 3.538 06/09/2020   No results found for: HGBA1C Lab Results  Component Value Date   WBC 13.4 (H) 07/09/2020   HGB 14.1 07/09/2020   HCT 42.4 07/09/2020   MCV 90.8 07/09/2020   PLT 243 07/09/2020   Lab Results  Component Value Date   ALT 17 07/09/2020   AST 24 07/09/2020   ALKPHOS 98 07/09/2020   BILITOT 0.7 07/09/2020     Review of Systems  Constitutional: Positive for fatigue. Negative for chills, fever and unexpected weight change.  HENT:       Food tastes different - sometimes bitter and sometimes salty  Respiratory: Negative for cough, chest tightness, shortness of breath and  wheezing.   Cardiovascular: Negative for chest pain and palpitations.  Gastrointestinal: Positive for nausea. Negative for vomiting.  Neurological: Negative for dizziness and headaches.    Patient Active Problem List   Diagnosis Date Noted  . Acquired thrombophilia ([place]) 07/15/2020  . AF (paroxysmal atrial fibrillation) ([place]) 06/19/2020  . Atrial fibrillation with rapid ventricular response ([place]) 06/09/2020  . Fracture of femoral neck, left, closed ([place]) 06/05/2020  . Atherosclerosis of aorta ([place]) 01/09/2020  . Stage 2 moderate COPD by GOLD classification ([place]) 01/09/2020  . HSV (herpes simplex virus) infection 01/09/2020  . BCC (basal cell carcinoma), face 10/12/2017  . Environmental and seasonal allergies 10/30/2014  . Psoriasis vulgaris 10/30/2014  . Essential (primary) hypertension 10/30/2014  . Gastro-esophageal reflux disease without esophagitis 10/30/2014  . Hot flash, menopausal 10/30/2014  . Hyperlipidemia, mixed 10/30/2014  . History of cancer of left breast 10/30/2014  . Bronchiectasis ([place]) 09/24/2013    No Known Allergies  Past Surgical History:  Procedure Laterality Date  . BREAST LUMPECTOMY Left 2008  . CHOLECYSTECTOMY    . HIP PINNING,CANNULATED Left 06/05/2020   Procedure: CANNULATED HIP PINNING;  Surgeon: Hessie Knows, MD;  Location: [facility] ORS;  Service: Orthopedics;  Laterality: Left;  . PARTIAL HYSTERECTOMY    . TEE WITHOUT CARDIOVERSION N/A  06/10/2020   Procedure: TRANSESOPHAGEAL ECHOCARDIOGRAM (TEE);  Surgeon: Kate Sable, MD;  Location: [facility] ORS;  Service: Cardiovascular;  Laterality: N/A;    Social History   Tobacco Use  . Smoking status: Never Smoker  . Smokeless tobacco: Never Used  Vaping Use  . Vaping Use: Never used  Substance Use Topics  . Alcohol use: No  . Drug use: No     Medication list has been reviewed and updated.  No outpatient medications have been marked as taking for the 07/15/20 encounter (Office Visit) with Glean Hess, MD.    [facility] 2/9 Scores 07/15/2020 06/30/2020 02/10/2020 01/09/2020  PHQ - 2 Score 0 0 0 0  PHQ- 9 Score 5 4 - 0    GAD 7 : Generalized Anxiety Score 07/15/2020 06/30/2020 01/09/2020  Nervous, Anxious, on Edge 0 0 0  Control/stop worrying 0 0 0  Worry too much - different things 0 0 0  Trouble relaxing 0 0 0  Restless 0 0 0  Easily annoyed or irritable 0 0 0  Afraid - awful might happen 0 0 0  Total GAD 7 Score 0 0 0  Anxiety Difficulty Not difficult at all - Not difficult at all    BP Readings from Last 3 Encounters:  07/15/20 (!) 152/84  07/09/20 (!) 153/62  07/04/20 (!) 195/78    Physical Exam Constitutional:      Appearance: Normal appearance.  HENT:     Mouth/Throat:     Mouth: Mucous membranes are moist.     Comments: Tongue reddened with some whitish patches No buccal lesions Cardiovascular:     Rate and Rhythm: Normal rate and regular rhythm.     Pulses: Normal pulses.     Heart sounds: No murmur heard.   Pulmonary:     Effort: Pulmonary effort is normal.     Breath sounds: No wheezing or rhonchi.  Abdominal:     Palpations: Abdomen is soft.     Tenderness: There is no abdominal tenderness.  Musculoskeletal:     Cervical back: Normal range of motion.     Right lower leg: No edema.     Left lower leg: No edema.  Lymphadenopathy:     Cervical: No cervical adenopathy.  Skin:    General: Skin is warm and dry.  Neurological:     General: No focal deficit present.     Mental Status: She is alert.  Psychiatric:        Mood and Affect: Mood normal.     Wt Readings from Last 3 Encounters:  07/15/20 122 lb (55.3 kg)  07/09/20 121 lb (54.9 kg)  07/04/20 123 lb 7.3 oz (56 kg)    BP (!) 152/84   Pulse 87   Temp 98.2 F (36.8 C) (Oral)   Ht 5\' 3"  (1.6 m)   Wt 122 lb (55.3 kg)   SpO2 97%   BMI 21.61 kg/m   Assessment and Plan: 1. Chronic nausea Pt has stopped all meds last week - at the same time started taking Reglan. Feeling about 75%  better, eating more and gagging less. Unclear what may have made the difference. Will continue Reglan until GI consult - metoCLOPramide (REGLAN) 10 MG tablet; Take 1 tablet (10 mg total) by mouth 3 (three) times daily with meals.  Dispense: 90 tablet; Refill: 0  2. Acquired thrombophilia ([place]) Resume Eliquis for stroke prevention  3. AF (paroxysmal atrial fibrillation) ([place]) In SR at this time Resume metoprolol for BP and  rate control  4. Thrush, oral May be contributing to her decreased appetitie - nystatin (MYCOSTATIN) 100000 UNIT/ML suspension; Take 5 mLs (500,000 Units total) by mouth 4 (four) times daily.  Dispense: 60 mL; Refill: 0  5. Atherosclerosis of aorta ([place]) Will consider resuming Lipitor in the near future.   Partially dictated using Editor, commissioning. Any errors are unintentional.  Halina Maidens, MD [facility]  07/15/2020

## 2020-07-15 NOTE — Patient Instructions (Signed)
Eliquis Metoprolol Reglan

## 2020-07-16 ENCOUNTER — Ambulatory Visit: Payer: Medicare PPO | Admitting: Medical

## 2020-07-16 ENCOUNTER — Encounter: Payer: Self-pay | Admitting: Medical

## 2020-07-16 VITALS — BP 122/80 | HR 60 | Ht 63.0 in | Wt 122.0 lb

## 2020-07-16 DIAGNOSIS — I10 Essential (primary) hypertension: Secondary | ICD-10-CM

## 2020-07-16 DIAGNOSIS — I480 Paroxysmal atrial fibrillation: Secondary | ICD-10-CM | POA: Diagnosis not present

## 2020-07-16 DIAGNOSIS — I5032 Chronic diastolic (congestive) heart failure: Secondary | ICD-10-CM | POA: Diagnosis not present

## 2020-07-16 DIAGNOSIS — R110 Nausea: Secondary | ICD-10-CM

## 2020-07-16 DIAGNOSIS — I272 Pulmonary hypertension, unspecified: Secondary | ICD-10-CM

## 2020-07-16 MED ORDER — METOPROLOL TARTRATE 25 MG PO TABS: 12.5000 mg | ORAL_TABLET | Freq: Two times a day (BID) | ORAL | 3 refills | Status: DC

## 2020-07-16 NOTE — Progress Notes (Addendum)
Cardiology Office Note:    Date:  07/16/2020   ID:  [name], DOB 1933/09/04, MRN [id number]  PCP:  Glean Hess, MD  [facility] Cardiologist:  Nelva Bush, MD  [facility] Electrophysiologist:  None   Referring MD: Glean Hess, MD   Chief Complaint: 2 week follow-up  History of Present Illness:    [name] is a 87 y.o. female with a hx of breast cancer, pulmonary hypertension, hypertension, recently diagnosed A. fib on Eliquis.  Patient was admitted 4/1 - 4/7 for fall and left femur fracture.  Postprocedure patient developed A. fib RVR and converted back to sinus rhythm.  She was started on Eliquis for CHA2DS2-VASc of 4-5.  Echo showed LVEF 65 to 02%, grade 2 diastolic dysfunction, moderately elevated pulmonary artery systolic pressure, systolic pressure 63.7 mmHg.  She was discharged to rehab center.  She was readmitted 4/15-4/20 shortness of breath, UTI, altered mental status.  Foley cath was removed and she was treated for yeast infection.  Mental status improved and she was sent home.  Patient was last seen 07/01/2020 and was in sinus rhythm with a heart rate in the 70s.  Due to GI issues she was instructed by her PCP to stop the amiodarone.  Patient was sent home with Lasix 20 mg to use as needed, however PCP felt she did not need it.  Instead she was started on HCTZ.  Toprol was increased.  Today, the patient reports she is still having GI issues, still with nausea. The last few days it has been a little better.  She saw PCP yesterday who gave her Reglan and referred patient to GI. She trialed stopping all medications last week to see if this has anything to do with it, but this did not seem to help. She restarted all her medications again yesterday. She is in SR today with heart rate of 60bpm.There was a confusion with BB dosing.  At the last visit she was only taking 12.5mg  daily not twice daily. BP good today. Has some LLE. No SOB, CP, orthopnea, pnd.  She restarted HCTZ yesterday. Weight is the same. She eats low salt diet.    Past Medical History:  Diagnosis Date  . Acute metabolic encephalopathy 8/58/8502  . Allergy   . Bronchiectasis ([place])   . Cancer [facility])    left breast cancer   . GERD (gastroesophageal reflux disease)   . Hyperlipemia   . Hypertension   . Intractable nausea and vomiting 06/20/2018    Past Surgical History:  Procedure Laterality Date  . BREAST LUMPECTOMY Left 2008  . CHOLECYSTECTOMY    . HIP PINNING,CANNULATED Left 06/05/2020   Procedure: CANNULATED HIP PINNING;  Surgeon: Hessie Knows, MD;  Location: [facility] ORS;  Service: Orthopedics;  Laterality: Left;  . PARTIAL HYSTERECTOMY    . TEE WITHOUT CARDIOVERSION N/A 06/10/2020   Procedure: TRANSESOPHAGEAL ECHOCARDIOGRAM (TEE);  Surgeon: Kate Sable, MD;  Location: [facility] ORS;  Service: Cardiovascular;  Laterality: N/A;    Current Medications: Current Meds  Medication Sig  . apixaban (ELIQUIS) 5 MG TABS tablet Take 1 tablet (5 mg total) by mouth 2 (two) times daily.  Marland Kitchen atorvastatin (LIPITOR) 20 MG tablet Take 1 tablet (20 mg total) by mouth daily.  . cetirizine (ZYRTEC) 10 MG tablet Take 1 tablet (10 mg total) by mouth daily.  . famotidine (PEPCID) 20 MG tablet Take 1 tablet (20 mg total) by mouth at bedtime. PRN reflux  . fluticasone (FLONASE) 50 MCG/ACT nasal  spray SHAKE LIQUID AND USE 2 SPRAYS IN EACH NOSTRIL DAILY AS NEEDED FOR ALLERGIES OR RHINITIS  . folic acid (FOLVITE) 1 MG tablet Take 1 mg by mouth daily. Takes every day expect Tuesday  . furosemide (LASIX) 20 MG tablet Take 1 tablet (20 mg total) by mouth as needed. For leg swelling  . gabapentin (NEURONTIN) 100 MG capsule Take 100 mg by mouth daily.  . hydrochlorothiazide (MICROZIDE) 12.5 MG capsule Take 12.5 mg by mouth daily.  . methotrexate (RHEUMATREX) 2.5 MG tablet Take 10 mg by mouth once a week. Tuesdays  . metoCLOPramide (REGLAN) 10 MG tablet Take 1 tablet (10 mg total) by mouth 3 (three)  times daily with meals.  . nystatin (MYCOSTATIN) 100000 UNIT/ML suspension Take 5 mLs (500,000 Units total) by mouth 4 (four) times daily.     Allergies:   Patient has no known allergies.   Social History   Socioeconomic History  . Marital status: Married    Spouse name: Not on file  . Number of children: 2  . Years of education: Not on file  . Highest education level: Not on file  Occupational History  . Not on file  Tobacco Use  . Smoking status: Never Smoker  . Smokeless tobacco: Never Used  Vaping Use  . Vaping Use: Never used  Substance and Sexual Activity  . Alcohol use: No  . Drug use: No  . Sexual activity: Not on file  Other Topics Concern  . Not on file  Social History Narrative  . Not on file   Social Determinants of Health   Financial Resource Strain: Low Risk   . Difficulty of Paying Living Expenses: Not hard at all  Food Insecurity: No Food Insecurity  . Worried About Charity fundraiser in the Last Year: Never true  . Ran Out of Food in the Last Year: Never true  Transportation Needs: No Transportation Needs  . Lack of Transportation (Medical): No  . Lack of Transportation (Non-Medical): No  Physical Activity: Sufficiently Active  . Days of Exercise per Week: 7 days  . Minutes of Exercise per Session: 30 min  Stress: No Stress Concern Present  . Feeling of Stress : Not at all  Social Connections: Moderately Isolated  . Frequency of Communication with Friends and Family: More than three times a week  . Frequency of Social Gatherings with Friends and Family: Once a week  . Attends Religious Services: Never  . Active Member of Clubs or Organizations: No  . Attends Archivist Meetings: Never  . Marital Status: Married     Family History: The patient's family history includes Breast cancer in her sister.  ROS:   Please see the history of present illness.     All other systems reviewed and are negative.  EKGs/Labs/Other Studies Reviewed:     The following studies were reviewed today:  Echo 06/10/20 1. Left ventricular ejection fraction, by estimation, is 65 to 70%. The  left ventricle has normal function. The left ventricle has no regional  wall motion abnormalities. There is mild left ventricular hypertrophy.  Left ventricular diastolic parameters  are consistent with Grade II diastolic dysfunction (pseudonormalization).  2. Right ventricular systolic function is normal. The right ventricular  size is normal. There is moderately elevated pulmonary artery systolic  pressure. The estimated right ventricular systolic pressure is 01.0 mmHg.  3. Left atrial size was mildly dilated.  4. The mitral valve is degenerative. No evidence of mitral valve  regurgitation.  Mild mitral stenosis.  5. The aortic valve is tricuspid. Aortic valve regurgitation is not  visualized.  6. The inferior vena cava is normal in size with greater than 50%  respiratory variability, suggesting right atrial pressure of 3 mmHg.   EKG:  EKG is  ordered today.  The ekg ordered today demonstrates SR, 60bpm, nonspecific ST/t wave changes  Recent Labs: 06/09/2020: B Natriuretic Peptide 185.9; TSH 3.538 06/23/2020: Magnesium 2.1 07/09/2020: ALT 17; BUN 18; Creatinine, Ser 0.76; Hemoglobin 14.1; Platelets 243; Potassium 3.8; Sodium 140  Recent Lipid Panel    Component Value Date/Time   CHOL 206 (H) 01/09/2020 1026   CHOL 280 (H) 10/08/2013 0516   TRIG 86 01/09/2020 1026   TRIG 112 10/08/2013 0516   HDL 93 01/09/2020 1026   HDL 106 (H) 10/08/2013 0516   CHOLHDL 2.2 01/09/2020 1026   VLDL 22 10/08/2013 0516   LDLCALC 98 01/09/2020 1026   LDLCALC 152 (H) 10/08/2013 0516    Physical Exam:    VS:  BP 122/80 (BP Location: Right Arm, Patient Position: Sitting, Cuff Size: Normal)   Pulse 60   Ht 5\' 3"  (1.6 m)   Wt 122 lb (55.3 kg)   SpO2 97%   BMI 21.61 kg/m     Wt Readings from Last 3 Encounters:  07/16/20 122 lb (55.3 kg)  07/15/20 122 lb (55.3  kg)  07/09/20 121 lb (54.9 kg)     GEN:  Well nourished, well developed in no acute distress HEENT: Normal NECK: No JVD; No carotid bruits LYMPHATICS: No lymphadenopathy CARDIAC: RRR, no murmurs, rubs, gallops RESPIRATORY:  Clear to auscultation without rales, wheezing or rhonchi  ABDOMEN: Soft, non-tender, non-distended MUSCULOSKELETAL:  1+ lower leg b/l edema; No deformity  SKIN: Warm and dry NEUROLOGIC:  Alert and oriented x 3 PSYCHIATRIC:  Normal affect   ASSESSMENT:    1. Paroxysmal A-fib ([place])   2. Essential hypertension   3. Pulmonary hypertension, unspecified ([place])   4. Chronic diastolic heart failure ([place])   5. Nausea    PLAN:    In order of problems listed above:  Paroxysmal A. Fib In SR today. She stopped all meds, including cardiac meds (eliquis and metoprolol, HCTZ) for a week to see if GI issues would improve. This did not help. She restarted ELiquis yesterday. CHADSVASC of 4-5. She is in SR today with HR of 60bpm. There was some confusion with BB dose. At first visit she was only taking metoprolol 12.5mg  daily. This was doubled, so they started 12.5mg  BID. HR and BP good today.We will continue 12.5mg  BID.   Hypertension BP well controlled. Continue BB and HCTZ.  HFpEF She stopped HCTZ as above, this was restarted yesterday. Has LLE on exam however weights are the same. She eats low salt diet. Recommended compression socks and leg elevation. CHD education given. She has lasix at home if needed. Continue BB. Since she recently restarted HCTZ we will see her back in 2 weeks to reevaluate volume status. Can check BMET at that time.  Pulmonary HTN Refer to sleep study at follow-up  GI issues Patient still has persistent nasuea. Amiodarone previously stopped. She was given Reglan by PCP and referred to GI.   Disposition: Follow up in 2 week(s) with APP    Signed, Christian Borgerding Ninfa Meeker, PA-C  07/16/2020 11:08 AM    [place]

## 2020-07-16 NOTE — Patient Instructions (Signed)
Medication Instructions:  Your physician has recommended you make the following change in your medication:   CHANGE Metoprolol Tartrate 12.5mg  TWICE daily  *If you need a refill on your cardiac medications before your next appointment, please call your pharmacy*   Lab Work:  None ordered   Testing/Procedures:  None ordered   Follow-Up: At [facility], you and your health needs are our priority.  As part of our continuing mission to provide you with exceptional heart care, we have created designated Provider Care Teams.  These Care Teams include your primary Cardiologist (physician) and Advanced Practice Providers (APPs -  Physician Assistants and Nurse Practitioners) who all work together to provide you with the care you need, when you need it.  We recommend signing up for the patient portal called "MyChart".  Sign up information is provided on this After Visit Summary.  MyChart is used to connect with patients for Virtual Visits (Telemedicine).  Patients are able to view lab/test results, encounter notes, upcoming appointments, etc.  Non-urgent messages can be sent to your provider as well.   To learn more about what you can do with MyChart, go to [email or web address].    Your next appointment:   2 week(s)  The format for your next appointment:   In Person  Provider:   You may see Nelva Bush, MD or one of the following Advanced Practice Providers on your designated Care Team:     Cadence Lynd, [place]

## 2020-07-16 NOTE — Addendum Note (Signed)
Addended by: Darlyne Russian on: 07/16/2020 11:18 AM   Modules accepted: Orders

## 2020-07-20 DIAGNOSIS — J479 Bronchiectasis, uncomplicated: Secondary | ICD-10-CM | POA: Diagnosis not present

## 2020-07-20 DIAGNOSIS — I10 Essential (primary) hypertension: Secondary | ICD-10-CM | POA: Diagnosis not present

## 2020-07-20 DIAGNOSIS — I480 Paroxysmal atrial fibrillation: Secondary | ICD-10-CM | POA: Diagnosis not present

## 2020-07-20 DIAGNOSIS — E876 Hypokalemia: Secondary | ICD-10-CM | POA: Diagnosis not present

## 2020-07-20 DIAGNOSIS — K219 Gastro-esophageal reflux disease without esophagitis: Secondary | ICD-10-CM | POA: Diagnosis not present

## 2020-07-20 DIAGNOSIS — S72012D Unspecified intracapsular fracture of left femur, subsequent encounter for closed fracture with routine healing: Secondary | ICD-10-CM | POA: Diagnosis not present

## 2020-07-20 DIAGNOSIS — E785 Hyperlipidemia, unspecified: Secondary | ICD-10-CM | POA: Diagnosis not present

## 2020-07-20 DIAGNOSIS — K5900 Constipation, unspecified: Secondary | ICD-10-CM | POA: Diagnosis not present

## 2020-07-22 DIAGNOSIS — I10 Essential (primary) hypertension: Secondary | ICD-10-CM | POA: Diagnosis not present

## 2020-07-22 DIAGNOSIS — E876 Hypokalemia: Secondary | ICD-10-CM | POA: Diagnosis not present

## 2020-07-22 DIAGNOSIS — K219 Gastro-esophageal reflux disease without esophagitis: Secondary | ICD-10-CM | POA: Diagnosis not present

## 2020-07-22 DIAGNOSIS — I480 Paroxysmal atrial fibrillation: Secondary | ICD-10-CM | POA: Diagnosis not present

## 2020-07-22 DIAGNOSIS — K5900 Constipation, unspecified: Secondary | ICD-10-CM | POA: Diagnosis not present

## 2020-07-22 DIAGNOSIS — E785 Hyperlipidemia, unspecified: Secondary | ICD-10-CM | POA: Diagnosis not present

## 2020-07-22 DIAGNOSIS — S72012D Unspecified intracapsular fracture of left femur, subsequent encounter for closed fracture with routine healing: Secondary | ICD-10-CM | POA: Diagnosis not present

## 2020-07-22 DIAGNOSIS — J479 Bronchiectasis, uncomplicated: Secondary | ICD-10-CM | POA: Diagnosis not present

## 2020-07-24 ENCOUNTER — Other Ambulatory Visit (INDEPENDENT_AMBULATORY_CARE_PROVIDER_SITE_OTHER): Payer: Medicare PPO | Admitting: Internal Medicine

## 2020-07-24 DIAGNOSIS — I700 Atherosclerosis of aorta: Secondary | ICD-10-CM

## 2020-07-24 DIAGNOSIS — N3001 Acute cystitis with hematuria: Secondary | ICD-10-CM | POA: Diagnosis not present

## 2020-07-24 DIAGNOSIS — J302 Other seasonal allergic rhinitis: Secondary | ICD-10-CM

## 2020-07-24 DIAGNOSIS — B009 Herpesviral infection, unspecified: Secondary | ICD-10-CM

## 2020-07-24 DIAGNOSIS — E876 Hypokalemia: Secondary | ICD-10-CM | POA: Diagnosis not present

## 2020-07-24 DIAGNOSIS — K5904 Chronic idiopathic constipation: Secondary | ICD-10-CM

## 2020-07-24 DIAGNOSIS — J479 Bronchiectasis, uncomplicated: Secondary | ICD-10-CM | POA: Diagnosis not present

## 2020-07-24 DIAGNOSIS — Z853 Personal history of malignant neoplasm of breast: Secondary | ICD-10-CM

## 2020-07-24 DIAGNOSIS — E782 Mixed hyperlipidemia: Secondary | ICD-10-CM

## 2020-07-24 DIAGNOSIS — I10 Essential (primary) hypertension: Secondary | ICD-10-CM | POA: Diagnosis not present

## 2020-07-24 DIAGNOSIS — L400 Psoriasis vulgaris: Secondary | ICD-10-CM

## 2020-07-24 DIAGNOSIS — S72012D Unspecified intracapsular fracture of left femur, subsequent encounter for closed fracture with routine healing: Secondary | ICD-10-CM

## 2020-07-24 DIAGNOSIS — K219 Gastro-esophageal reflux disease without esophagitis: Secondary | ICD-10-CM | POA: Diagnosis not present

## 2020-07-24 DIAGNOSIS — D6869 Other thrombophilia: Secondary | ICD-10-CM

## 2020-07-24 DIAGNOSIS — I480 Paroxysmal atrial fibrillation: Secondary | ICD-10-CM

## 2020-07-24 DIAGNOSIS — Z9181 History of falling: Secondary | ICD-10-CM

## 2020-07-24 NOTE — Progress Notes (Signed)
Received orders from [place]. Start of care 06/29/20.   Orders from 06/29/20 through 08/27/20 are reviewed, signed and faxed.

## 2020-07-28 DIAGNOSIS — K5900 Constipation, unspecified: Secondary | ICD-10-CM | POA: Diagnosis not present

## 2020-07-28 DIAGNOSIS — S72012D Unspecified intracapsular fracture of left femur, subsequent encounter for closed fracture with routine healing: Secondary | ICD-10-CM | POA: Diagnosis not present

## 2020-07-28 DIAGNOSIS — I10 Essential (primary) hypertension: Secondary | ICD-10-CM | POA: Diagnosis not present

## 2020-07-28 DIAGNOSIS — K219 Gastro-esophageal reflux disease without esophagitis: Secondary | ICD-10-CM | POA: Diagnosis not present

## 2020-07-28 DIAGNOSIS — E785 Hyperlipidemia, unspecified: Secondary | ICD-10-CM | POA: Diagnosis not present

## 2020-07-28 DIAGNOSIS — I480 Paroxysmal atrial fibrillation: Secondary | ICD-10-CM | POA: Diagnosis not present

## 2020-07-28 DIAGNOSIS — E876 Hypokalemia: Secondary | ICD-10-CM | POA: Diagnosis not present

## 2020-07-28 DIAGNOSIS — J479 Bronchiectasis, uncomplicated: Secondary | ICD-10-CM | POA: Diagnosis not present

## 2020-07-29 DIAGNOSIS — E876 Hypokalemia: Secondary | ICD-10-CM | POA: Diagnosis not present

## 2020-07-29 DIAGNOSIS — S72012D Unspecified intracapsular fracture of left femur, subsequent encounter for closed fracture with routine healing: Secondary | ICD-10-CM | POA: Diagnosis not present

## 2020-07-29 DIAGNOSIS — I480 Paroxysmal atrial fibrillation: Secondary | ICD-10-CM | POA: Diagnosis not present

## 2020-07-29 DIAGNOSIS — E785 Hyperlipidemia, unspecified: Secondary | ICD-10-CM | POA: Diagnosis not present

## 2020-07-29 DIAGNOSIS — J479 Bronchiectasis, uncomplicated: Secondary | ICD-10-CM | POA: Diagnosis not present

## 2020-07-29 DIAGNOSIS — K5900 Constipation, unspecified: Secondary | ICD-10-CM | POA: Diagnosis not present

## 2020-07-29 DIAGNOSIS — I10 Essential (primary) hypertension: Secondary | ICD-10-CM | POA: Diagnosis not present

## 2020-07-29 DIAGNOSIS — K219 Gastro-esophageal reflux disease without esophagitis: Secondary | ICD-10-CM | POA: Diagnosis not present

## 2020-07-30 ENCOUNTER — Ambulatory Visit
Admission: RE | Admit: 2020-07-30 | Discharge: 2020-07-30 | Disposition: A | Discharge status: Home / Self Care | Payer: Medicare PPO | Source: Ambulatory Visit | Attending: Medical | Admitting: Medical

## 2020-07-30 ENCOUNTER — Other Ambulatory Visit
Admission: RE | Admit: 2020-07-30 | Discharge: 2020-07-30 | Disposition: A | Discharge status: Home / Self Care | Payer: Medicare PPO | Source: Ambulatory Visit | Attending: Medical | Admitting: Medical

## 2020-07-30 ENCOUNTER — Ambulatory Visit: Payer: Medicare PPO | Admitting: Medical

## 2020-07-30 ENCOUNTER — Encounter: Payer: Self-pay | Admitting: Medical

## 2020-07-30 ENCOUNTER — Other Ambulatory Visit: Payer: Self-pay

## 2020-07-30 VITALS — BP 140/70 | HR 62 | Ht 63.0 in | Wt 121.0 lb

## 2020-07-30 DIAGNOSIS — I272 Pulmonary hypertension, unspecified: Secondary | ICD-10-CM | POA: Diagnosis not present

## 2020-07-30 DIAGNOSIS — I700 Atherosclerosis of aorta: Secondary | ICD-10-CM | POA: Diagnosis not present

## 2020-07-30 DIAGNOSIS — R110 Nausea: Secondary | ICD-10-CM | POA: Diagnosis not present

## 2020-07-30 DIAGNOSIS — I5032 Chronic diastolic (congestive) heart failure: Secondary | ICD-10-CM | POA: Diagnosis not present

## 2020-07-30 DIAGNOSIS — R0601 Orthopnea: Secondary | ICD-10-CM

## 2020-07-30 DIAGNOSIS — Z79899 Other long term (current) drug therapy: Secondary | ICD-10-CM | POA: Diagnosis not present

## 2020-07-30 DIAGNOSIS — I10 Essential (primary) hypertension: Secondary | ICD-10-CM | POA: Diagnosis not present

## 2020-07-30 DIAGNOSIS — I480 Paroxysmal atrial fibrillation: Secondary | ICD-10-CM

## 2020-07-30 LAB — BASIC METABOLIC PANEL
Anion gap: 11 (ref 5–15)
BUN: 22 mg/dL (ref 8–23)
CO2: 25 mmol/L (ref 22–32)
Calcium: 9.2 mg/dL (ref 8.9–10.3)
Chloride: 101 mmol/L (ref 98–111)
Creatinine, Ser: 0.9 mg/dL (ref 0.44–1.00)
GFR, Estimated: >60 mL/min (ref >60)
Glucose, Bld: 96 mg/dL (ref 70–99)
Potassium: 3.9 mmol/L (ref 3.5–5.1)
Sodium: 137 mmol/L (ref 135–145)

## 2020-07-30 NOTE — Progress Notes (Signed)
Cardiology Office Note:    Date:  07/30/2020   ID:  [name], DOB May 23, 1933, MRN [id number]  PCP:  Glean Hess, MD  [facility] Cardiologist:  Nelva Bush, MD  [facility] Electrophysiologist:  None   Referring MD: Glean Hess, MD   Chief Complaint: 2-week follow-up  History of Present Illness:    [name] is a 87 y.o. female with a hx of breast cancer, pulmonary hypertension, COPD and chronic bronchiectasis, hypertension, and A. fib on Eliquis who present for follow-up.  Patient was admitted 4/1-4/7 for fall and left femur fracture.  Post procedure patient developed A. fib RVR and converted back to sinus rhythm.  She was started on Eliquis for CHA2DS2-VASc of 4-5.  Echo showed LVEF 65 to 75%, grade 2 diastolic dysfunction, moderately elevated pulmonary artery systolic pressure, systolic pressure 10.2 mmHg.  She was discharged to rehab center.  She was readmitted 4/15-4/20 for shortness of breath, UTI, altered mental status.  She was treated for yeast infection and sent home.  Patient saw her PCP at which time she reported GI issues, and amiodarone was stopped.  Subsequently, patient stopped all medications for a week to see if this would improve GI issues.  This did not seem to help so she restarted medications.  Last seen 07/16/2020 was still having issues despite discontinuing amiodarone. She was in sinus rhythm.  She had just restarted all her cardiac meds the day before.  Today, the patient reports she has been doing well. GI issus stopped 3 days ago. She has been eating and drinking normally. Appetite is still low. She reports compliance with all cardiac medications. BP at home has been around 120/60. BP here is a little high, but has not had medications this AM. Patient has trouble laying flat, this is unchaged since the last admission.    Past Medical History:  Diagnosis Date  . Acute metabolic encephalopathy 5/85/2778  . Allergy   .  Bronchiectasis ([place])   . Cancer [facility])    left breast cancer   . GERD (gastroesophageal reflux disease)   . Hyperlipemia   . Hypertension   . Intractable nausea and vomiting 06/20/2018    Past Surgical History:  Procedure Laterality Date  . BREAST LUMPECTOMY Left 2008  . CHOLECYSTECTOMY    . HIP PINNING,CANNULATED Left 06/05/2020   Procedure: CANNULATED HIP PINNING;  Surgeon: Hessie Knows, MD;  Location: [facility] ORS;  Service: Orthopedics;  Laterality: Left;  . PARTIAL HYSTERECTOMY    . TEE WITHOUT CARDIOVERSION N/A 06/10/2020   Procedure: TRANSESOPHAGEAL ECHOCARDIOGRAM (TEE);  Surgeon: Kate Sable, MD;  Location: [facility] ORS;  Service: Cardiovascular;  Laterality: N/A;    Current Medications: Current Meds  Medication Sig  . atorvastatin (LIPITOR) 20 MG tablet Take 1 tablet (20 mg total) by mouth daily.  . cetirizine (ZYRTEC) 10 MG tablet Take 1 tablet (10 mg total) by mouth daily.  . famotidine (PEPCID) 20 MG tablet Take 1 tablet (20 mg total) by mouth at bedtime. PRN reflux  . fluticasone (FLONASE) 50 MCG/ACT nasal spray SHAKE LIQUID AND USE 2 SPRAYS IN EACH NOSTRIL DAILY AS NEEDED FOR ALLERGIES OR RHINITIS  . folic acid (FOLVITE) 1 MG tablet Take 1 mg by mouth daily. Takes every day expect Tuesday  . gabapentin (NEURONTIN) 100 MG capsule Take 100 mg by mouth daily.  . hydrochlorothiazide (MICROZIDE) 12.5 MG capsule Take 12.5 mg by mouth daily.  . methotrexate (RHEUMATREX) 2.5 MG tablet Take 10 mg by mouth once  a week. Tuesdays  . metoprolol tartrate (LOPRESSOR) 25 MG tablet Take 0.5 tablets (12.5 mg total) by mouth 2 (two) times daily.     Allergies:   Patient has no known allergies.   Social History   Socioeconomic History  . Marital status: Married    Spouse name: Not on file  . Number of children: 2  . Years of education: Not on file  . Highest education level: Not on file  Occupational History  . Not on file  Tobacco Use  . Smoking status: Never Smoker  . Smokeless  tobacco: Never Used  Vaping Use  . Vaping Use: Never used  Substance and Sexual Activity  . Alcohol use: No  . Drug use: No  . Sexual activity: Not on file  Other Topics Concern  . Not on file  Social History Narrative  . Not on file   Social Determinants of Health   Financial Resource Strain: Low Risk   . Difficulty of Paying Living Expenses: Not hard at all  Food Insecurity: No Food Insecurity  . Worried About Charity fundraiser in the Last Year: Never true  . Ran Out of Food in the Last Year: Never true  Transportation Needs: No Transportation Needs  . Lack of Transportation (Medical): No  . Lack of Transportation (Non-Medical): No  Physical Activity: Sufficiently Active  . Days of Exercise per Week: 7 days  . Minutes of Exercise per Session: 30 min  Stress: No Stress Concern Present  . Feeling of Stress : Not at all  Social Connections: Moderately Isolated  . Frequency of Communication with Friends and Family: More than three times a week  . Frequency of Social Gatherings with Friends and Family: Once a week  . Attends Religious Services: Never  . Active Member of Clubs or Organizations: No  . Attends Archivist Meetings: Never  . Marital Status: Married     Family History: The patient's *family history includes Breast cancer in her sister.  ROS:   Please see the history of present illness.     All other systems reviewed and are negative.  EKGs/Labs/Other Studies Reviewed:    The following studies were reviewed today:  Echo 06/10/20 1. Left ventricular ejection fraction, by estimation, is 65 to 70%. The  left ventricle has normal function. The left ventricle has no regional  wall motion abnormalities. There is mild left ventricular hypertrophy.  Left ventricular diastolic parameters  are consistent with Grade II diastolic dysfunction (pseudonormalization).  2. Right ventricular systolic function is normal. The right ventricular  size is normal.  There is moderately elevated pulmonary artery systolic  pressure. The estimated right ventricular systolic pressure is 40.9 mmHg.  3. Left atrial size was mildly dilated.  4. The mitral valve is degenerative. No evidence of mitral valve  regurgitation. Mild mitral stenosis.  5. The aortic valve is tricuspid. Aortic valve regurgitation is not  visualized.  6. The inferior vena cava is normal in size with greater than 50%  respiratory variability, suggesting right atrial pressure of 3 mmHg.    EKG:  EKG is  ordered today.  The ekg ordered today demonstrates NSR, 62bpm, TWI aVL, no significant change  Recent Labs: 06/09/2020: B Natriuretic Peptide 185.9; TSH 3.538 06/23/2020: Magnesium 2.1 07/09/2020: ALT 17; BUN 18; Creatinine, Ser 0.76; Hemoglobin 14.1; Platelets 243; Potassium 3.8; Sodium 140  Recent Lipid Panel    Component Value Date/Time   CHOL 206 (H) 01/09/2020 1026   CHOL 280 (H)  10/08/2013 0516   TRIG 86 01/09/2020 1026   TRIG 112 10/08/2013 0516   HDL 93 01/09/2020 1026   HDL 106 (H) 10/08/2013 0516   CHOLHDL 2.2 01/09/2020 1026   VLDL 22 10/08/2013 0516   LDLCALC 98 01/09/2020 1026   LDLCALC 152 (H) 10/08/2013 0516    Physical Exam:    VS:  BP 140/70   Pulse 62   Ht 5\' 3"  (1.6 m)   Wt 121 lb (54.9 kg)   BMI 21.43 kg/m     Wt Readings from Last 3 Encounters:  07/30/20 121 lb (54.9 kg)  07/16/20 122 lb (55.3 kg)  07/15/20 122 lb (55.3 kg)     XKP:VVZS nourished, well developed in no acute distress HEENT: Normal NECK: No JVD; No carotid bruits LYMPHATICS: No lymphadenopathy CARDIAC: RRR, no murmurs, rubs, gallops RESPIRATORY:  Crackles at bases ABDOMEN: Soft, non-tender, non-distended MUSCULOSKELETAL:  No edema; No deformity  SKIN: Warm and dry NEUROLOGIC:  Alert and oriented x 3 PSYCHIATRIC:  Normal affect   ASSESSMENT:    1. Paroxysmal A-fib ([place])   2. Essential hypertension   3. Pulmonary hypertension, unspecified ([place])   4. Nausea   5.  Chronic diastolic heart failure ([place])   6. Orthopnea    PLAN:    In order of problems listed above:  Paroxysmal A. Fib EKG shows SR with heart rate of 62bpm. Reports compliance with Eliquis since the last visit. CHADSVASC of 4-5. Amiodarone previously stopped for GI issues. Continue rate control with Metoprolol 12.5mg  BID.   Hypertension BP well controlled at home, 120s/80s. Here its a little high but she has not had her AM medications. Continue HCTZ and metoprolol.  Orthopnea HFpEF Reports orthopnea since the last admission, it's not getting worse. Last CXR was 5/5 showing no acute disease. She has seen pulmonology at [place] he past who noted COPD with bronchiectasis. She has crackles at the bases. LLE resolved since restarting HCTZ. No JVD appreciated. I will check a CXR. Recommended further pulmonary follow-up if breathing doesn't improve. Continue BB. BMET today for resumption of HCTZ.  Pulmonary hypertension Echo showed mildly elevated pulmonary pressures. Refer for sleep study. Pulmonology from [facility] note stage 2 COPD and chronic bronchiectasis as above.   GI issues Resolved 3 days ago, appetite is still low but she is eating and drinking  Disposition: Follow up in 6 month(s) with MD/PA    Signed, Kael Keetch Ninfa Meeker, PA-C  07/30/2020 10:09 AM    [facility]

## 2020-07-30 NOTE — Addendum Note (Signed)
Addended by: Anselm Pancoast on: 07/30/2020 10:23 AM   Modules accepted: Orders

## 2020-07-30 NOTE — Patient Instructions (Addendum)
Medication Instructions:  Your physician recommends that you continue on your current medications as directed. Please refer to the Current Medication list given to you today.  *If you need a refill on your cardiac medications before your next appointment, please call your pharmacy*   Lab Work: Your physician recommends that you have lab work TODAY: BMET  If you have labs (blood work) drawn today and your tests are completely normal, you will receive your results only by: Marland Kitchen MyChart Message (if you have MyChart) OR . A paper copy in the mail If you have any lab test that is abnormal or we need to change your treatment, we will call you to review the results.   Testing/Procedures:  1) A chest x-ray takes a picture of the organs and structures inside the chest, including the heart, lungs, and blood vessels. This test can show several things, including, whether the heart is enlarges; whether fluid is building up in the lungs; and whether pacemaker / defibrillator leads are still in place.  Please check in at Registration desk at the [facility] after you leave this office.  2) You have been referred to pulmonology for sleep study. You will receive a call from their office. Please let us know if you have not received a call in 2 weeks.    Follow-Up: At [facility], you and your health needs are our priority.  As part of our continuing mission to provide you with exceptional heart care, we have created designated Provider Care Teams.  These Care Teams include your primary Cardiologist (physician) and Advanced Practice Providers (APPs -  Physician Assistants and Nurse Practitioners) who all work together to provide you with the care you need, when you need it.  We recommend signing up for the patient portal called "MyChart".  Sign up information is provided on this After Visit Summary.  MyChart is used to connect with patients for Virtual Visits (Telemedicine).  Patients are able to view  lab/test results, encounter notes, upcoming appointments, etc.  Non-urgent messages can be sent to your provider as well.   To learn more about what you can do with MyChart, go to [email or web address].    Your next appointment:   6 month(s)  The format for your next appointment:   In Person  Provider:   You may see Nelva Bush, MD or one of the following Advanced Practice Providers on your designated Care Team:    Murray Hodgkins, NP  Christell Faith, PA-C  Marrianne Mood, PA-C  Cadence Cowan, [place]  Laurann Montana, NP

## 2020-08-05 DIAGNOSIS — S72002A Fracture of unspecified part of neck of left femur, initial encounter for closed fracture: Secondary | ICD-10-CM | POA: Diagnosis not present

## 2020-08-05 DIAGNOSIS — J479 Bronchiectasis, uncomplicated: Secondary | ICD-10-CM | POA: Diagnosis not present

## 2020-08-05 DIAGNOSIS — S72012D Unspecified intracapsular fracture of left femur, subsequent encounter for closed fracture with routine healing: Secondary | ICD-10-CM | POA: Diagnosis not present

## 2020-08-05 DIAGNOSIS — K219 Gastro-esophageal reflux disease without esophagitis: Secondary | ICD-10-CM | POA: Diagnosis not present

## 2020-08-05 DIAGNOSIS — I10 Essential (primary) hypertension: Secondary | ICD-10-CM | POA: Diagnosis not present

## 2020-08-05 DIAGNOSIS — E785 Hyperlipidemia, unspecified: Secondary | ICD-10-CM | POA: Diagnosis not present

## 2020-08-05 DIAGNOSIS — I480 Paroxysmal atrial fibrillation: Secondary | ICD-10-CM | POA: Diagnosis not present

## 2020-08-05 DIAGNOSIS — K5900 Constipation, unspecified: Secondary | ICD-10-CM | POA: Diagnosis not present

## 2020-08-05 DIAGNOSIS — E876 Hypokalemia: Secondary | ICD-10-CM | POA: Diagnosis not present

## 2020-08-06 ENCOUNTER — Encounter: Payer: Self-pay | Admitting: Gastroenterology

## 2020-08-06 ENCOUNTER — Other Ambulatory Visit: Payer: Self-pay

## 2020-08-06 ENCOUNTER — Ambulatory Visit (INDEPENDENT_AMBULATORY_CARE_PROVIDER_SITE_OTHER): Payer: Medicare PPO | Admitting: Gastroenterology

## 2020-08-06 VITALS — BP 131/70 | HR 68 | Temp 98.2°F

## 2020-08-06 DIAGNOSIS — R109 Unspecified abdominal pain: Secondary | ICD-10-CM | POA: Diagnosis not present

## 2020-08-06 DIAGNOSIS — R1013 Epigastric pain: Secondary | ICD-10-CM

## 2020-08-06 DIAGNOSIS — R110 Nausea: Secondary | ICD-10-CM

## 2020-08-06 MED ORDER — PANTOPRAZOLE SODIUM 40 MG PO TBEC: 40.0000 mg | DELAYED_RELEASE_TABLET | Freq: Every day | ORAL | 0 refills | Status: DC

## 2020-08-06 NOTE — Progress Notes (Signed)
Sumiya Antigua [address]  [place]  [place],  [address]  Main: [phone]  Fax: [phone]   Gastroenterology Consultation  Referring Provider:     Glean Hess, MD Primary Care Physician:  Glean Hess, MD Reason for Consultation:    Nausea        HPI:    Chief Complaint  Patient presents with  . New Patient (Initial Visit)  . Nausea    TAMEIKA HECKMANN is a 87 y.o. y/o female referred for consultation & management  by Dr. Army Melia, Jesse Sans, MD.  Patient reports ongoing nausea since being in the hospital in April 2022 for left hip fracture and symptoms have not improved since then.  No vomiting.  Reports decrease in appetite due to her symptoms as well.  Denies any abdominal pain or postprandial pain.  No altered bowel habits.  Reports 1 soft bowel movement daily.  No blood in stool.  Reports previous colonoscopies that have been normal with last 1 being over 10 years ago.  Reports not available.  Denies any previous upper endoscopy.  No dysphagia.  The only new medication that she was recently started on has been Eliquis for A. Fib.  Recent imaging for her symptoms have included CT scan with contrast in April 2022 which did not report any acute abnormalities in the abdomen or pelvis.  Mild intra and extrahepatic biliary duct dilatation was seen was that was attribute it to history of cholecystectomy.  Small hiatal hernia was also seen.  Patient has been on Pepcid chronically as she reports history of GERD.  She has not been given any PPIs recently.  Denies any NSAID use.  Liver enzymes have been normal.  Past Medical History:  Diagnosis Date  . Acute metabolic encephalopathy 11/20/567  . Allergy   . Bronchiectasis ([place])   . Cancer [facility])    left breast cancer   . GERD (gastroesophageal reflux disease)   . Hyperlipemia   . Hypertension   . Intractable nausea and vomiting 06/20/2018    Past Surgical History:  Procedure Laterality Date  .  BREAST LUMPECTOMY Left 2008  . CHOLECYSTECTOMY    . HIP PINNING,CANNULATED Left 06/05/2020   Procedure: CANNULATED HIP PINNING;  Surgeon: Hessie Knows, MD;  Location: [facility] ORS;  Service: Orthopedics;  Laterality: Left;  . PARTIAL HYSTERECTOMY    . TEE WITHOUT CARDIOVERSION N/A 06/10/2020   Procedure: TRANSESOPHAGEAL ECHOCARDIOGRAM (TEE);  Surgeon: Kate Sable, MD;  Location: [facility] ORS;  Service: Cardiovascular;  Laterality: N/A;    Prior to Admission medications   Medication Sig Start Date End Date Taking? Authorizing Provider  atorvastatin (LIPITOR) 20 MG tablet Take 1 tablet (20 mg total) by mouth daily. 06/12/20  Yes Sreenath, Sudheer B, MD  cetirizine (ZYRTEC) 10 MG tablet Take 1 tablet (10 mg total) by mouth daily. 06/11/20  Yes Sreenath, Sudheer B, MD  famotidine (PEPCID) 20 MG tablet Take 1 tablet (20 mg total) by mouth at bedtime. PRN reflux 07/04/20  Yes Baity, Coralie Keens, NP  fluticasone (FLONASE) 50 MCG/ACT nasal spray SHAKE LIQUID AND USE 2 SPRAYS IN EACH NOSTRIL DAILY AS NEEDED FOR ALLERGIES OR RHINITIS 06/05/20  Yes Glean Hess, MD  folic acid (FOLVITE) 1 MG tablet Take 1 mg by mouth daily. Takes every day expect Tuesday   Yes [provider]  gabapentin (NEURONTIN) 100 MG capsule Take 100 mg by mouth daily. 05/21/20  Yes [provider]  hydrochlorothiazide (MICROZIDE) 12.5 MG capsule Take  12.5 mg by mouth daily.   Yes [provider]  methotrexate (RHEUMATREX) 2.5 MG tablet Take 10 mg by mouth once a week. Tuesdays   Yes [provider]  metoprolol tartrate (LOPRESSOR) 25 MG tablet Take 0.5 tablets (12.5 mg total) by mouth 2 (two) times daily. 07/16/20 07/11/21 Yes Furth, Cadence H, PA-C  apixaban (ELIQUIS) 5 MG TABS tablet Take 1 tablet (5 mg total) by mouth 2 (two) times daily. 06/24/20 07/24/20  [name], MD    Family History  Problem Relation Age of Onset  . Breast cancer Sister      Social History   Tobacco Use  . Smoking status:  Never Smoker  . Smokeless tobacco: Never Used  Vaping Use  . Vaping Use: Never used  Substance Use Topics  . Alcohol use: No  . Drug use: No    Allergies as of 08/06/2020  . (No Known Allergies)    Review of Systems:    All systems reviewed and negative except where noted in HPI.   Physical Exam:  BP 131/70   Pulse 68   Temp 98.2 F (36.8 C) (Oral)  No LMP recorded. Patient is postmenopausal. Psych:  Alert and cooperative. Normal mood and affect. General:   Alert,  Well-developed, well-nourished, pleasant and cooperative in NAD Head:  Normocephalic and atraumatic. Eyes:  Sclera clear, no icterus.   Conjunctiva pink. Ears:  Normal auditory acuity. Nose:  No deformity, discharge, or lesions. Mouth:  No deformity or lesions,oropharynx pink & moist. Neck:  Supple; no masses or thyromegaly. Abdomen:  Normal bowel sounds.  No bruits.  Soft, non-tender and non-distended without masses, hepatosplenomegaly or hernias noted.  No guarding or rebound tenderness.    Msk:  Symmetrical without gross deformities. Good, equal movement & strength bilaterally. Pulses:  Normal pulses noted. Extremities:  No clubbing or edema.  No cyanosis. Neurologic:  Alert and oriented x3;  grossly normal neurologically. Skin:  Intact without significant lesions or rashes. No jaundice. Lymph Nodes:  No significant cervical adenopathy. Psych:  Alert and cooperative. Normal mood and affect.   Labs: CBC    Component Value Date/Time   WBC 13.4 (H) 07/09/2020 1005   RBC 4.67 07/09/2020 1005   HGB 14.1 07/09/2020 1005   HGB 13.4 01/09/2020 1026   HCT 42.4 07/09/2020 1005   HCT 40.8 01/09/2020 1026   PLT 243 07/09/2020 1005   PLT 173 01/09/2020 1026   MCV 90.8 07/09/2020 1005   MCV 91 01/09/2020 1026   MCV 90 10/07/2013 1143   MCH 30.2 07/09/2020 1005   MCHC 33.3 07/09/2020 1005   RDW 13.8 07/09/2020 1005   RDW 12.9 01/09/2020 1026   RDW 13.4 10/07/2013 1143   LYMPHSABS 1.6 06/19/2020 1233    LYMPHSABS 1.9 01/09/2020 1026   LYMPHSABS 2.5 01/05/2013 0447   MONOABS 1.1 (H) 06/19/2020 1233   MONOABS 0.8 01/05/2013 0447   EOSABS 0.3 06/19/2020 1233   EOSABS 0.3 01/09/2020 1026   EOSABS 0.2 01/05/2013 0447   BASOSABS 0.1 06/19/2020 1233   BASOSABS 0.0 01/09/2020 1026   BASOSABS 0.1 01/05/2013 0447   CMP     Component Value Date/Time   NA 137 07/30/2020 1112   NA 142 01/09/2020 1026   NA 140 10/07/2013 1143   K 3.9 07/30/2020 1112   K 4.2 10/07/2013 1143   CL 101 07/30/2020 1112   CL 106 10/07/2013 1143   CO2 25 07/30/2020 1112   CO2 26 10/07/2013 1143   GLUCOSE 96  07/30/2020 1112   GLUCOSE 117 (H) 10/07/2013 1143   BUN 22 07/30/2020 1112   BUN 24 01/09/2020 1026   BUN 19 (H) 10/07/2013 1143   CREATININE 0.90 07/30/2020 1112   CREATININE 0.69 10/07/2013 1143   CALCIUM 9.2 07/30/2020 1112   CALCIUM 8.9 10/07/2013 1143   PROT 7.5 07/09/2020 1005   PROT 6.9 01/09/2020 1026   PROT 8.6 (H) 01/04/2013 1042   ALBUMIN 3.9 07/09/2020 1005   ALBUMIN 4.4 01/09/2020 1026   ALBUMIN 3.0 (L) 01/04/2013 1042   AST 24 07/09/2020 1005   AST 67 (H) 01/04/2013 1042   ALT 17 07/09/2020 1005   ALT 62 01/04/2013 1042   ALKPHOS 98 07/09/2020 1005   ALKPHOS 136 01/04/2013 1042   BILITOT 0.7 07/09/2020 1005   BILITOT 0.5 01/09/2020 1026   BILITOT 0.4 01/04/2013 1042   GFRNONAA >60 07/30/2020 1112   GFRNONAA >60 10/07/2013 1143   GFRAA 78 01/09/2020 1026   GFRAA >60 10/07/2013 1143    Imaging Studies: DG Chest 2 View  Result Date: 07/31/2020 CLINICAL DATA:  Orthopnea. EXAM: CHEST - 2 VIEW COMPARISON:  Jul 09, 2020 FINDINGS: The lungs are hyperinflated. Chronic appearing increased lung markings are noted without evidence of acute infiltrate, pleural effusion or pneumothorax. Mild biapical pleural thickening is seen. The heart size and mediastinal contours are within normal limits. There is marked severity calcification of the aortic arch. The visualized skeletal structures are  unremarkable. IMPRESSION: No active cardiopulmonary disease. Electronically Signed   By: Virgina Norfolk M.D.   On: 07/31/2020 15:47   DG Chest 2 View  Result Date: 07/09/2020 CLINICAL DATA:  Nausea and decreased appetite for approximately 3 weeks. EXAM: CHEST - 2 VIEW COMPARISON:  And single-view of the chest 06/19/2020 in 06/07/2020. CT chest 06/08/2020. FINDINGS: The lungs are clear. Heart size is normal. No pneumothorax or pleural fluid. Aortic atherosclerosis. No acute or focal bony abnormality. IMPRESSION: No acute disease. Aortic Atherosclerosis (ICD10-I70.0). Electronically Signed   By: Inge Rise M.D.   On: 07/09/2020 13:47    Assessment and Plan:   IRELAND VIRRUETA is a 87 y.o. y/o female has been referred for nausea  Symptoms seem to have started during her admission for hip fracture in April 2022 and have not improved since then.  Denies any abdominal pain or vomiting.  This is an elderly patient that has gone through a hip fracture, and recent UTI, new A. fib and is now on Eliquis, and with all this in mind, it appears this has affected the patient's appetite and she is having some nausea  There are no alarm symptoms present and her CT scan has been otherwise reassuring  Will start with H. pylori breath test and also change her Pepcid to PPI as she may have developed some gastritis or esophagitis or small ulcers from all the acute issues she has recently gone through.  If symptoms continue, consider upper GI study and possible right upper quadrant ultrasound  Liver enzymes are otherwise reassuring and biliary duct dilatation has been attributed to cholecystectomy status on imaging   (Risks of PPI use were discussed with patient including bone loss, C. Diff diarrhea, pneumonia, infections, CKD, electrolyte abnormalities.  Pt. Verbalizes understanding and chooses to continue the medication.)     Dr Gissele Antigua  Speech recognition software was used to dictate the  above note.

## 2020-08-08 LAB — H. PYLORI BREATH TEST: H pylori Breath Test: NEGATIVE

## 2020-08-10 ENCOUNTER — Telehealth: Payer: Self-pay | Admitting: Internal Medicine

## 2020-08-10 DIAGNOSIS — S72012D Unspecified intracapsular fracture of left femur, subsequent encounter for closed fracture with routine healing: Secondary | ICD-10-CM | POA: Diagnosis not present

## 2020-08-10 DIAGNOSIS — I10 Essential (primary) hypertension: Secondary | ICD-10-CM | POA: Diagnosis not present

## 2020-08-10 DIAGNOSIS — E785 Hyperlipidemia, unspecified: Secondary | ICD-10-CM | POA: Diagnosis not present

## 2020-08-10 DIAGNOSIS — E876 Hypokalemia: Secondary | ICD-10-CM | POA: Diagnosis not present

## 2020-08-10 DIAGNOSIS — K219 Gastro-esophageal reflux disease without esophagitis: Secondary | ICD-10-CM | POA: Diagnosis not present

## 2020-08-10 DIAGNOSIS — I480 Paroxysmal atrial fibrillation: Secondary | ICD-10-CM | POA: Diagnosis not present

## 2020-08-10 DIAGNOSIS — J479 Bronchiectasis, uncomplicated: Secondary | ICD-10-CM | POA: Diagnosis not present

## 2020-08-10 DIAGNOSIS — K5900 Constipation, unspecified: Secondary | ICD-10-CM | POA: Diagnosis not present

## 2020-08-10 NOTE — Telephone Encounter (Signed)
Copied from [place] [id number]. Topic: General - Other >> Aug 10, 2020 10:11 AM Leward Quan A wrote: Reason for CRM: Colletta Maryland with [facility] called in to inquire of Dr Army Melia about an order that was faxed over to be completed originally in April and on 07/23/20 and 08/06/20 for start of care date on 06/29/20 would like to have this signed and faxed back ASAP. Fax# [phone] Ph# [phone]

## 2020-08-10 NOTE — Telephone Encounter (Signed)
This was already faxed on 07/25/2020. Will print and re-fax now.

## 2020-08-13 ENCOUNTER — Telehealth: Payer: Self-pay

## 2020-08-13 ENCOUNTER — Telehealth: Payer: Self-pay | Admitting: Gastroenterology

## 2020-08-13 NOTE — Telephone Encounter (Signed)
Copied from [place] [id number]. Topic: General - Other >> Aug 10, 2020 10:11 AM Leward Quan A wrote: Reason for CRM: Colletta Maryland with [facility] called in to inquire of Dr Army Melia about an order that was faxed over to be completed originally in April and on 07/23/20 and 08/06/20 for start of care date on 06/29/20 would like to have this signed and faxed back ASAP. Fax# [phone] Ph# [phone] >> Aug 13, 2020  2:54 PM Keene Breath wrote: Colletta Maryland is calling again to stated that she still has not received the fax.  She would like it resent directly to her fax # at [phone].  Please advise

## 2020-08-13 NOTE — Telephone Encounter (Unsigned)
Copied from [place] [id number]. Topic: General - Other >> Aug 10, 2020 10:11 AM Leward Quan A wrote: Reason for CRM: Colletta Maryland with [facility] called in to inquire of Dr Army Melia about an order that was faxed over to be completed originally in April and on 07/23/20 and 08/06/20 for start of care date on 06/29/20 would like to have this signed and faxed back ASAP. Fax# [phone] Ph# [phone] >> Aug 13, 2020  2:54 PM Keene Breath wrote: Colletta Maryland is calling again to stated that she still has not received the fax.  She would like it resent directly to her fax # at [phone].  Please advise

## 2020-08-13 NOTE — Telephone Encounter (Signed)
Duplicate   KP 

## 2020-08-13 NOTE — Telephone Encounter (Signed)
Pt given results as she did see that it was negative... pt advised to continue new Rx for Protonix and she has a 6 week f/u scheduled... if anything were to change prior to call us and let us know

## 2020-08-13 NOTE — Telephone Encounter (Signed)
Ants results of breth test

## 2020-08-14 NOTE — Telephone Encounter (Signed)
Printed and faxed  KP 

## 2020-08-15 ENCOUNTER — Other Ambulatory Visit: Payer: Self-pay | Admitting: Internal Medicine

## 2020-08-15 DIAGNOSIS — R110 Nausea: Secondary | ICD-10-CM

## 2020-08-15 NOTE — Telephone Encounter (Signed)
dc'd 07/30/20 "error" Ernie Hew CMA

## 2020-08-16 ENCOUNTER — Emergency Department: Payer: Medicare PPO

## 2020-08-16 ENCOUNTER — Encounter: Payer: Self-pay | Admitting: Emergency Medicine

## 2020-08-16 ENCOUNTER — Other Ambulatory Visit: Payer: Self-pay

## 2020-08-16 ENCOUNTER — Emergency Department
Admission: EM | Admit: 2020-08-16 | Discharge: 2020-08-16 | Disposition: A | Discharge status: Home / Self Care | Payer: Medicare PPO | Attending: Emergency Medicine | Admitting: Emergency Medicine

## 2020-08-16 DIAGNOSIS — J439 Emphysema, unspecified: Secondary | ICD-10-CM | POA: Diagnosis not present

## 2020-08-16 DIAGNOSIS — Z7951 Long term (current) use of inhaled steroids: Secondary | ICD-10-CM | POA: Diagnosis not present

## 2020-08-16 DIAGNOSIS — Z20822 Contact with and (suspected) exposure to covid-19: Secondary | ICD-10-CM | POA: Insufficient documentation

## 2020-08-16 DIAGNOSIS — Z79899 Other long term (current) drug therapy: Secondary | ICD-10-CM | POA: Insufficient documentation

## 2020-08-16 DIAGNOSIS — R0602 Shortness of breath: Secondary | ICD-10-CM | POA: Diagnosis not present

## 2020-08-16 DIAGNOSIS — Z7901 Long term (current) use of anticoagulants: Secondary | ICD-10-CM | POA: Insufficient documentation

## 2020-08-16 DIAGNOSIS — J441 Chronic obstructive pulmonary disease with (acute) exacerbation: Secondary | ICD-10-CM

## 2020-08-16 DIAGNOSIS — I4891 Unspecified atrial fibrillation: Secondary | ICD-10-CM | POA: Diagnosis not present

## 2020-08-16 DIAGNOSIS — I10 Essential (primary) hypertension: Secondary | ICD-10-CM | POA: Insufficient documentation

## 2020-08-16 DIAGNOSIS — Z853 Personal history of malignant neoplasm of breast: Secondary | ICD-10-CM | POA: Insufficient documentation

## 2020-08-16 HISTORY — DX: Chronic obstructive pulmonary disease, unspecified: J44.9

## 2020-08-16 LAB — URINALYSIS, COMPLETE (UACMP) WITH MICROSCOPIC
Bacteria, UA: NONE SEEN
Bilirubin Urine: NEGATIVE
Glucose, UA: NEGATIVE mg/dL
Hgb urine dipstick: NEGATIVE
Ketones, ur: NEGATIVE mg/dL
Nitrite: NEGATIVE
Protein, ur: NEGATIVE mg/dL
Specific Gravity, Urine: 1.024 (ref 1.005–1.030)
pH: 6 (ref 5.0–8.0)

## 2020-08-16 LAB — CBC
HCT: 41.3 % (ref 36.0–46.0)
Hemoglobin: 13.9 g/dL (ref 12.0–15.0)
MCH: 30.1 pg (ref 26.0–34.0)
MCHC: 33.7 g/dL (ref 30.0–36.0)
MCV: 89.4 fL (ref 80.0–100.0)
Platelets: 232 10*3/uL (ref 150–400)
RBC: 4.62 MIL/uL (ref 3.87–5.11)
RDW: 12.8 % (ref 11.5–15.5)
WBC: 12.3 10*3/uL — ABNORMAL HIGH (ref 4.0–10.5)
nRBC: 0 % (ref 0.0–0.2)

## 2020-08-16 LAB — TROPONIN I (HIGH SENSITIVITY)
Troponin I (High Sensitivity): 5 ng/L
Troponin I (High Sensitivity): 6 ng/L (ref <18)

## 2020-08-16 LAB — BASIC METABOLIC PANEL
Anion gap: 7 (ref 5–15)
BUN: 22 mg/dL (ref 8–23)
CO2: 28 mmol/L (ref 22–32)
Calcium: 9.1 mg/dL (ref 8.9–10.3)
Chloride: 105 mmol/L (ref 98–111)
Creatinine, Ser: 1.04 mg/dL — ABNORMAL HIGH (ref 0.44–1.00)
GFR, Estimated: 53 mL/min — ABNORMAL LOW (ref >60)
Glucose, Bld: 116 mg/dL — ABNORMAL HIGH (ref 70–99)
Potassium: 3.5 mmol/L (ref 3.5–5.1)
Sodium: 140 mmol/L (ref 135–145)

## 2020-08-16 LAB — RESP PANEL BY RT-PCR (FLU A&B, COVID) ARPGX2
Influenza A by PCR: NEGATIVE
Influenza B by PCR: NEGATIVE
SARS Coronavirus 2 by RT PCR: NEGATIVE

## 2020-08-16 MED ORDER — PREDNISONE 20 MG PO TABS: 20.0000 mg | ORAL_TABLET | Freq: Two times a day (BID) | ORAL | 0 refills | Status: AC

## 2020-08-16 MED ORDER — ALBUTEROL SULFATE HFA 108 (90 BASE) MCG/ACT IN AERS: 2.0000 | INHALATION_SPRAY | RESPIRATORY_TRACT | 0 refills | Status: DC | PRN

## 2020-08-16 MED ORDER — GUAIFENESIN ER 600 MG PO TB12: 600.0000 mg | ORAL_TABLET | Freq: Two times a day (BID) | ORAL | 0 refills | Status: AC

## 2020-08-16 MED ORDER — METOCLOPRAMIDE HCL 10 MG PO TABS: 10.0000 mg | ORAL_TABLET | Freq: Four times a day (QID) | ORAL | 0 refills | Status: DC | PRN

## 2020-08-16 MED ORDER — LACTATED RINGERS IV BOLUS
1000.0000 mL | Freq: Once | INTRAVENOUS | Status: AC
  Administered 2020-08-16: 1000 mL via INTRAVENOUS

## 2020-08-16 MED ORDER — IOHEXOL 350 MG/ML SOLN
75.0000 mL | Freq: Once | INTRAVENOUS | Status: AC | PRN
  Administered 2020-08-16: 75 mL via INTRAVENOUS

## 2020-08-16 MED ORDER — AMOXICILLIN-POT CLAVULANATE 875-125 MG PO TABS: 1.0000 | ORAL_TABLET | Freq: Two times a day (BID) | ORAL | 0 refills | Status: DC

## 2020-08-16 MED ORDER — METOCLOPRAMIDE HCL 5 MG/ML IJ SOLN
10.0000 mg | Freq: Once | INTRAMUSCULAR | Status: AC
  Administered 2020-08-16: 10 mg via INTRAVENOUS
  Filled 2020-08-16: qty 2

## 2020-08-16 NOTE — ED Provider Notes (Signed)
[facility] Emergency Department Provider Note  ____________________________________________  Time seen: Approximately 8:27 PM  I have reviewed the triage vital signs and the nursing notes.   HISTORY  Chief Complaint Shortness of Breath    HPI [name] is a 87 y.o. female with a history of COPD, hypertension, paroxysmal atrial fibrillation who comes ED complaining of shortness of breath since about 2:30 PM today.  Has a history of COPD and this feels similar.  Occasional nonproductive cough.  No chest pain.  No fever chills or body aches.  No fatigue.  No vomiting diarrhea or loss of appetite.  Been eating and drinking normally, no dizziness.  Compliant with medications.  Symptoms are constant over the past hour, no aggravating or alleviating factors.  Notes that she had a fall and hip fracture about 2 months ago, had hip surgery and spent time in rehab.  Past Medical History:  Diagnosis Date   Acute metabolic encephalopathy 16/12/9602   Allergy    Bronchiectasis (HCC)    Cancer (HCC)    left breast cancer    COPD (chronic obstructive pulmonary disease) (HCC)    GERD (gastroesophageal reflux disease)    Hyperlipemia    Hypertension    Intractable nausea and vomiting 06/20/2018     Patient Active Problem List   Diagnosis Date Noted   Acquired thrombophilia ([place]) 07/15/2020   AF (paroxysmal atrial fibrillation) ([place]) 06/19/2020   Atrial fibrillation with rapid ventricular response ([place]) 06/09/2020   Fracture of femoral neck, left, closed ([place]) 06/05/2020   Atherosclerosis of aorta ([place]) 01/09/2020   Stage 2 moderate COPD by GOLD classification ([place]) 01/09/2020   HSV (herpes simplex virus) infection 01/09/2020   BCC (basal cell carcinoma), face 10/12/2017   Environmental and seasonal allergies 10/30/2014   Psoriasis vulgaris 10/30/2014   Essential (primary) hypertension 10/30/2014   Gastro-esophageal reflux disease without esophagitis  10/30/2014   Hot flash, menopausal 10/30/2014   Hyperlipidemia, mixed 10/30/2014   History of cancer of left breast 10/30/2014   Bronchiectasis ([place]) 09/24/2013     Past Surgical History:  Procedure Laterality Date   BREAST LUMPECTOMY Left 2008   CHOLECYSTECTOMY     HIP PINNING,CANNULATED Left 06/05/2020   Procedure: CANNULATED HIP PINNING;  Surgeon: Hessie Knows, MD;  Location: [facility] ORS;  Service: Orthopedics;  Laterality: Left;   PARTIAL HYSTERECTOMY     TEE WITHOUT CARDIOVERSION N/A 06/10/2020   Procedure: TRANSESOPHAGEAL ECHOCARDIOGRAM (TEE);  Surgeon: Kate Sable, MD;  Location: [facility] ORS;  Service: Cardiovascular;  Laterality: N/A;     Prior to Admission medications   Medication Sig Start Date End Date Taking? Authorizing Provider  albuterol (PROVENTIL HFA) 108 (90 Base) MCG/ACT inhaler Inhale 2 puffs into the lungs every 4 (four) hours as needed for wheezing or shortness of breath. 08/16/20  Yes Carrie Mew, MD  amoxicillin-clavulanate (AUGMENTIN) 875-125 MG tablet Take 1 tablet by mouth 2 (two) times daily. 08/16/20  Yes Carrie Mew, MD  guaiFENesin (MUCINEX) 600 MG 12 hr tablet Take 1 tablet (600 mg total) by mouth 2 (two) times daily for 15 days. 08/16/20 08/31/20 Yes Carrie Mew, MD  metoCLOPramide (REGLAN) 10 MG tablet Take 1 tablet (10 mg total) by mouth every 6 (six) hours as needed. 08/16/20 11/14/20 Yes Carrie Mew, MD  predniSONE (DELTASONE) 20 MG tablet Take 1 tablet (20 mg total) by mouth 2 (two) times daily with a meal for 4 days. 08/16/20 08/20/20 Yes Carrie Mew, MD  apixaban (ELIQUIS) 5 MG TABS tablet Take  1 tablet (5 mg total) by mouth 2 (two) times daily. 06/24/20 07/24/20  [name], MD  atorvastatin (LIPITOR) 20 MG tablet Take 1 tablet (20 mg total) by mouth daily. 06/12/20   Sidney Ace, MD  cetirizine (ZYRTEC) 10 MG tablet Take 1 tablet (10 mg total) by mouth daily. 06/11/20   Sreenath, Trula Slade, MD  fluticasone (FLONASE) 50  MCG/ACT nasal spray SHAKE LIQUID AND USE 2 SPRAYS IN EACH NOSTRIL DAILY AS NEEDED FOR ALLERGIES OR RHINITIS 06/05/20   Glean Hess, MD  folic acid (FOLVITE) 1 MG tablet Take 1 mg by mouth daily. Takes every day expect Tuesday    [provider]  gabapentin (NEURONTIN) 100 MG capsule Take 100 mg by mouth daily. 05/21/20   [provider]  hydrochlorothiazide (MICROZIDE) 12.5 MG capsule Take 12.5 mg by mouth daily.    [provider]  methotrexate (RHEUMATREX) 2.5 MG tablet Take 10 mg by mouth once a week. Tuesdays    [provider]  metoprolol tartrate (LOPRESSOR) 25 MG tablet Take 0.5 tablets (12.5 mg total) by mouth 2 (two) times daily. 07/16/20 07/11/21  Furth, Cadence H, PA-C  pantoprazole (PROTONIX) 40 MG tablet Take 1 tablet (40 mg total) by mouth daily. 08/06/20 09/05/20  Virgel Manifold, MD     Allergies Patient has no known allergies.   Family History  Problem Relation Age of Onset   Breast cancer Sister     Social History Social History   Tobacco Use   Smoking status: Never   Smokeless tobacco: Never  Vaping Use   Vaping Use: Never used  Substance Use Topics   Alcohol use: No   Drug use: No    Review of Systems  Constitutional:   No fever or chills.  ENT:   No sore throat. No rhinorrhea. Cardiovascular:   No chest pain or syncope. Respiratory:   Positive shortness of breath and  cough. Gastrointestinal:   Negative for abdominal pain, vomiting and diarrhea.  Musculoskeletal:   Negative for focal pain or swelling All other systems reviewed and are negative except as documented above in ROS and HPI.  ____________________________________________   PHYSICAL EXAM:  VITAL SIGNS: ED Triage Vitals [08/16/20 1545]  Enc Vitals Group     BP (!) 184/67     Pulse Rate 72     Resp 20     Temp 97.9 F (36.6 C)     Temp Source Oral     SpO2 96 %     Weight 117 lb (53.1 kg)     Height 5\' 3"  (1.6 m)     Head Circumference       Peak Flow      Pain Score 0     Pain Loc      Pain Edu?      Excl. in [place]?     Vital signs reviewed, nursing assessments reviewed.   Constitutional:   Alert and oriented. Non-toxic appearance. Eyes:   Conjunctivae are normal. EOMI. PERRL. ENT      Head:   Normocephalic and atraumatic.      Nose:   Wearing a mask.      Mouth/Throat:   Wearing a mask.      Neck:   No meningismus. Full ROM. Hematological/Lymphatic/Immunilogical:   No cervical lymphadenopathy. Cardiovascular:   RRR. Symmetric bilateral radial and DP pulses.  No murmurs. Cap refill less than 2 seconds. Respiratory:   Normal respiratory effort without tachypnea/retractions.  Scant bilateral basilar  crackles Gastrointestinal:   Soft and nontender. Non distended. There is no CVA tenderness.  No rebound, rigidity, or guarding. Genitourinary:   deferred Musculoskeletal:   Normal range of motion in all extremities. No joint effusions.  No lower extremity tenderness.  No edema. Neurologic:   Normal speech and language.  Motor grossly intact. No acute focal neurologic deficits are appreciated.  Skin:    Skin is warm, dry and intact. No rash noted.  No petechiae, purpura, or bullae.  ____________________________________________    LABS (pertinent positives/negatives) (all labs ordered are listed, but only abnormal results are displayed) Labs Reviewed  BASIC METABOLIC PANEL - Abnormal; Notable for the following components:      Result Value   Glucose, Bld 116 (*)    Creatinine, Ser 1.04 (*)    GFR, Estimated 53 (*)    All other components within normal limits  CBC - Abnormal; Notable for the following components:   WBC 12.3 (*)    All other components within normal limits  URINALYSIS, COMPLETE (UACMP) WITH MICROSCOPIC - Abnormal; Notable for the following components:   Color, Urine YELLOW (*)    APPearance CLEAR (*)    Leukocytes,Ua TRACE (*)    All other components within normal limits  RESP PANEL BY RT-PCR (FLU  A&B, COVID) ARPGX2  TROPONIN I (HIGH SENSITIVITY)  TROPONIN I (HIGH SENSITIVITY)   ____________________________________________   EKG  Interpreted by me Sinus rhythm rate of 71, normal axis and intervals.  Poor R wave progression.  Normal ST segments and T waves.  1 PVC on the strip.  ____________________________________________    RADIOLOGY  DG Chest 2 View  Result Date: 08/16/2020 CLINICAL DATA:  Shortness of breath.  History of COPD. EXAM: CHEST - 2 VIEW COMPARISON:  07/30/2020 FINDINGS: The cardiac silhouette, mediastinal and hilar contours are normal. There is tortuosity and calcification of the thoracic aorta. Stable emphysematous changes and pulmonary scarring. No acute overlying pulmonary process is identified. No pleural effusions or pulmonary lesions. The bony thorax is intact. IMPRESSION: Chronic emphysematous changes and pulmonary scarring but no acute overlying pulmonary process. Electronically Signed   By: Marijo Sanes M.D.   On: 08/16/2020 16:13   CT Angio Chest PE W and/or Wo Contrast  Result Date: 08/16/2020 CLINICAL DATA:  Shortness of breath. EXAM: CT ANGIOGRAPHY CHEST WITH CONTRAST TECHNIQUE: Multidetector CT imaging of the chest was performed using the standard protocol during bolus administration of intravenous contrast. Multiplanar CT image reconstructions and MIPs were obtained to evaluate the vascular anatomy. CONTRAST:  40mL OMNIPAQUE IOHEXOL 350 MG/ML SOLN COMPARISON:  Chest CT 06/08/2020 FINDINGS: Cardiovascular: The heart is normal in size. No pericardial effusion. There is mild tortuosity and moderate calcification of the thoracic aorta. No focal aneurysm or dissection. Stable coronary artery calcifications. The pulmonary arterial tree is well opacified. No filling defects to suggest pulmonary embolism. Mediastinum/Nodes: Stable scattered mediastinal and hilar lymph nodes. The esophagus is grossly normal. Lungs/Pleura: Chronic emphysematous changes and pulmonary  scarring. Somewhat mosaic pattern of ground-glass attenuation in both lungs is unchanged and can be seen with reactive airways disease such as asthma, respiratory or constrictive bronchiolitis, hypersensitivity pneumonitis or cryptogenic organizing pneumonia. Chronic inflammation is also possible. Stable patchy areas of nodularity, likely inflammatory change. No focal airspace consolidation to suggest pneumonia. No pleural effusions. Upper Abdomen: No significant upper abdominal findings. Musculoskeletal: Surgical changes involving the left breast. No breast masses, supraclavicular or axillary adenopathy. The bony thorax is intact. Review of the MIP images confirms the above  findings. IMPRESSION: 1. No CT findings for pulmonary embolism. 2. Stable atherosclerotic calcifications involving the thoracic aorta and coronary arteries. 3. Chronic emphysematous changes and pulmonary scarring. 4. Somewhat mosaic pattern of ground-glass attenuation in both lungs is unchanged and can be seen with reactive airways disease such as asthma, respiratory or constrictive bronchiolitis, hypersensitivity pneumonitis or cryptogenic organizing pneumonia. Chronic inflammation is also possible. 5. Stable patchy areas of nodularity, likely inflammatory change. 6. Emphysema and aortic atherosclerosis. Aortic Atherosclerosis (ICD10-I70.0) and Emphysema (ICD10-J43.9). Electronically Signed   By: Marijo Sanes M.D.   On: 08/16/2020 18:53    ____________________________________________   PROCEDURES Procedures  ____________________________________________  DIFFERENTIAL DIAGNOSIS   Pneumonia, pulmonary embolism, pleural effusion, viral illness, COPD exacerbation, and NSTEMI, CHF  CLINICAL IMPRESSION / ASSESSMENT AND PLAN / ED COURSE  Medications ordered in the ED: Medications  lactated ringers bolus 1,000 mL (1,000 mLs Intravenous New Bag/Given 08/16/20 1856)  metoCLOPramide (REGLAN) injection 10 mg (10 mg Intravenous Given  08/16/20 1856)  iohexol (OMNIPAQUE) 350 MG/ML injection 75 mL (75 mLs Intravenous Contrast Given 08/16/20 1839)    Pertinent labs & imaging results that were available during my care of the patient were reviewed by me and considered in my medical decision making (see chart for details).  [name] was evaluated in Emergency Department on 08/16/2020 for the symptoms described in the history of present illness. She was evaluated in the context of the global COVID-19 pandemic, which necessitated consideration that the patient might be at risk for infection with the SARS-CoV-2 virus that causes COVID-19. Institutional protocols and algorithms that pertain to the evaluation of patients at risk for COVID-19 are in a state of rapid change based on information released by regulatory bodies including the CDC and federal and state organizations. These policies and algorithms were followed during the patient's care in the ED.   Patient presents with shortness of breath, fairly sudden onset.  Vital signs unremarkable except for elevated blood pressure.  Exam suspicious for pneumonia.  Chest x-ray consistent with underlying COPD without acute findings.  Due to recent surgery, CT angiogram obtained to rule out PE which is negative.  Does not find any evidence of acute pneumonia or pulmonary edema.  COVID and flu test are negative, serial troponins and other labs are all reassuring.  Patient is nontoxic, suitable for outpatient follow-up with treatment for COPD exacerbation with Augmentin and prednisone Mucinex.  We will refill her home Reglan that she is taking for chronic nausea as well.      ____________________________________________   FINAL CLINICAL IMPRESSION(S) / ED DIAGNOSES    Final diagnoses:  COPD exacerbation [facility])     ED Discharge Orders          Ordered    metoCLOPramide (REGLAN) 10 MG tablet  Every 6 hours PRN        08/16/20 2026    predniSONE (DELTASONE) 20 MG tablet  2 times daily  with meals        08/16/20 2026    guaiFENesin (MUCINEX) 600 MG 12 hr tablet  2 times daily        08/16/20 2026    albuterol (PROVENTIL HFA) 108 (90 Base) MCG/ACT inhaler  Every 4 hours PRN        08/16/20 2026    amoxicillin-clavulanate (AUGMENTIN) 875-125 MG tablet  2 times daily        08/16/20 2026            Portions of this note were generated  with [employer]. Dictation errors may occur despite best attempts at proofreading.   Carrie Mew, MD 08/16/20 2030

## 2020-08-16 NOTE — ED Triage Notes (Signed)
Pt via POV from home. Pt c/o SOB for 1 hour PTA, pt has a hx of COPD. Denies CP. Denies fever. Denies cough. Pt is A&Ox4 and NAD.

## 2020-08-16 NOTE — ED Notes (Signed)
This RN and Sam RN attempted IV start x 2 with no success.

## 2020-08-18 DIAGNOSIS — E785 Hyperlipidemia, unspecified: Secondary | ICD-10-CM | POA: Diagnosis not present

## 2020-08-18 DIAGNOSIS — J479 Bronchiectasis, uncomplicated: Secondary | ICD-10-CM | POA: Diagnosis not present

## 2020-08-18 DIAGNOSIS — S72012D Unspecified intracapsular fracture of left femur, subsequent encounter for closed fracture with routine healing: Secondary | ICD-10-CM | POA: Diagnosis not present

## 2020-08-18 DIAGNOSIS — K219 Gastro-esophageal reflux disease without esophagitis: Secondary | ICD-10-CM | POA: Diagnosis not present

## 2020-08-18 DIAGNOSIS — E876 Hypokalemia: Secondary | ICD-10-CM | POA: Diagnosis not present

## 2020-08-18 DIAGNOSIS — I480 Paroxysmal atrial fibrillation: Secondary | ICD-10-CM | POA: Diagnosis not present

## 2020-08-18 DIAGNOSIS — I10 Essential (primary) hypertension: Secondary | ICD-10-CM | POA: Diagnosis not present

## 2020-08-18 DIAGNOSIS — K5900 Constipation, unspecified: Secondary | ICD-10-CM | POA: Diagnosis not present

## 2020-08-19 ENCOUNTER — Telehealth: Payer: Self-pay | Admitting: Gastroenterology

## 2020-08-19 DIAGNOSIS — R197 Diarrhea, unspecified: Secondary | ICD-10-CM

## 2020-08-19 NOTE — Telephone Encounter (Signed)
pantoprazole (PROTONIX) 40 MG tablet  She is has been taking medicine for two weeks and not feeling better. Went to ER on Sunday 06/13. Please advise.

## 2020-08-20 NOTE — Telephone Encounter (Signed)
Patient will go pick up stool study today and she is having 3-4 diarrhea spells a day

## 2020-08-20 NOTE — Telephone Encounter (Signed)
Patient is having nausea but no vomiting and her stomach is making a lot of noise she states. She has very mild abdominal pain all of her stomach that comes and goes. She is having diarrhea since starting the antibiotic that  the ER gave her on 08/16/2020 for COPD exacerbation. She states she is eating with the medication.

## 2020-08-24 ENCOUNTER — Telehealth: Payer: Self-pay | Admitting: Gastroenterology

## 2020-08-24 ENCOUNTER — Telehealth: Payer: Self-pay | Admitting: Internal Medicine

## 2020-08-24 DIAGNOSIS — K5900 Constipation, unspecified: Secondary | ICD-10-CM | POA: Diagnosis not present

## 2020-08-24 DIAGNOSIS — I10 Essential (primary) hypertension: Secondary | ICD-10-CM | POA: Diagnosis not present

## 2020-08-24 DIAGNOSIS — J479 Bronchiectasis, uncomplicated: Secondary | ICD-10-CM | POA: Diagnosis not present

## 2020-08-24 DIAGNOSIS — K219 Gastro-esophageal reflux disease without esophagitis: Secondary | ICD-10-CM | POA: Diagnosis not present

## 2020-08-24 DIAGNOSIS — E785 Hyperlipidemia, unspecified: Secondary | ICD-10-CM | POA: Diagnosis not present

## 2020-08-24 DIAGNOSIS — S72012D Unspecified intracapsular fracture of left femur, subsequent encounter for closed fracture with routine healing: Secondary | ICD-10-CM | POA: Diagnosis not present

## 2020-08-24 DIAGNOSIS — E876 Hypokalemia: Secondary | ICD-10-CM | POA: Diagnosis not present

## 2020-08-24 DIAGNOSIS — I480 Paroxysmal atrial fibrillation: Secondary | ICD-10-CM | POA: Diagnosis not present

## 2020-08-24 LAB — GI PROFILE, STOOL, PCR

## 2020-08-24 NOTE — Telephone Encounter (Signed)
[name] (daugher) called and Derl Barrow in not doing any better. Still nauseous, weak feeling, very fatigued. What can she do?

## 2020-08-24 NOTE — Telephone Encounter (Signed)
Sent a message to Dr. Stacie Acres CMA Ulyess Blossom. Awaiting response to see of patient can be moved up sooner for appt.

## 2020-08-24 NOTE — Telephone Encounter (Signed)
Pt has appt tomorrow to f/u

## 2020-08-24 NOTE — Telephone Encounter (Signed)
[name] calling from [facility] is calling for PT orders. Freqeuncy- 2 times a week for 3 weeks, 1 time a week for 6 weeks.   Pt has been to the ER 3-4 a month for unresolved GI issues. Pt is becoming hopeless. Pt wellbeing is highly being compromised.  CB- [phone]

## 2020-08-25 ENCOUNTER — Encounter: Payer: Self-pay | Admitting: Gastroenterology

## 2020-08-25 ENCOUNTER — Other Ambulatory Visit: Payer: Self-pay

## 2020-08-25 ENCOUNTER — Ambulatory Visit: Payer: Medicare PPO | Admitting: Gastroenterology

## 2020-08-25 VITALS — BP 154/73 | HR 60 | Temp 98.1°F | Wt 119.6 lb

## 2020-08-25 DIAGNOSIS — R109 Unspecified abdominal pain: Secondary | ICD-10-CM | POA: Diagnosis not present

## 2020-08-25 DIAGNOSIS — R110 Nausea: Secondary | ICD-10-CM | POA: Diagnosis not present

## 2020-08-25 MED ORDER — SUCRALFATE 1 GM/10ML PO SUSP: 1.0000 g | Freq: Four times a day (QID) | ORAL | 1 refills | Status: DC

## 2020-08-25 MED ORDER — PANTOPRAZOLE SODIUM 40 MG PO TBEC: 40.0000 mg | DELAYED_RELEASE_TABLET | Freq: Two times a day (BID) | ORAL | 0 refills | Status: DC

## 2020-08-25 NOTE — Progress Notes (Signed)
Opaline Antigua, MD [address]  [place], [place] [address]  Main: [phone]  Fax: [phone]   Primary Care Physician: Glean Hess, MD   Chief Complaint  Patient presents with   Follow-up    Pt reports that she is still feeling nauseous, weak/fatigued... not able to eat much or stand on her own... only complete 5 days of Augmentin... wants to know if Eliquis could be causing...     HPI: [name] is a 87 y.o. female here for follow-up of nausea.  Presents with her family member.  Patient continues to report nausea and this is her main complaint and not so much abdominal pain.  Reports decrease in appetite due to nausea.  Is feeling nauseous even with or without food.  No vertigo.  No vomiting.  H pylori breath test was negative.  PPI once daily has not helped.  Patient had called our clinic and reported diarrhea last week.  GI profile was done and was negative.  Patient states diarrhea has improved and she has only had 1 bowel movement today that his formed.  No blood in stool.  Previous history: Patient reports ongoing nausea since being in the hospital in April 2022 for left hip fracture and symptoms have not improved since then.  No vomiting.  Reports decrease in appetite due to her symptoms as well.  Denies any abdominal pain or postprandial pain.  No altered bowel habits.  Reports 1 soft bowel movement daily.  No blood in stool.  Reports previous colonoscopies that have been normal with last 1 being over 10 years ago.  Reports not available.  Denies any previous upper endoscopy.  No dysphagia.  The only new medication that she was recently started on has been Eliquis for A. Fib.  Recent imaging for her symptoms have included CT scan with contrast in April 2022 which did not report any acute abnormalities in the abdomen or pelvis.  Mild intra and extrahepatic biliary duct dilatation was seen was that was attribute it to history of cholecystectomy.   Small hiatal hernia was also seen.  Patient has been on Pepcid chronically as she reports history of GERD.  She has not been given any PPIs recently.  Denies any NSAID use.  Liver enzymes have been normal.   ROS: All ROS reviewed a negative except as per HPI   Past Medical History:  Diagnosis Date   Acute metabolic encephalopathy 96/75/9163   Allergy    Bronchiectasis ([place])    Cancer (HCC)    left breast cancer    COPD (chronic obstructive pulmonary disease) (HCC)    GERD (gastroesophageal reflux disease)    Hyperlipemia    Hypertension    Intractable nausea and vomiting 06/20/2018    Past Surgical History:  Procedure Laterality Date   BREAST LUMPECTOMY Left 2008   CHOLECYSTECTOMY     HIP PINNING,CANNULATED Left 06/05/2020   Procedure: CANNULATED HIP PINNING;  Surgeon: Hessie Knows, MD;  Location: [facility] ORS;  Service: Orthopedics;  Laterality: Left;   PARTIAL HYSTERECTOMY     TEE WITHOUT CARDIOVERSION N/A 06/10/2020   Procedure: TRANSESOPHAGEAL ECHOCARDIOGRAM (TEE);  Surgeon: Kate Sable, MD;  Location: [facility] ORS;  Service: Cardiovascular;  Laterality: N/A;    Prior to Admission medications   Medication Sig Start Date End Date Taking? Authorizing Provider  albuterol (PROVENTIL HFA) 108 (90 Base) MCG/ACT inhaler Inhale 2 puffs into the lungs every 4 (four) hours as needed for wheezing or shortness of  breath. 08/16/20  Yes Carrie Mew, MD  atorvastatin (LIPITOR) 20 MG tablet Take 1 tablet (20 mg total) by mouth daily. 06/12/20  Yes Sreenath, Sudheer B, MD  cetirizine (ZYRTEC) 10 MG tablet Take 1 tablet (10 mg total) by mouth daily. 06/11/20  Yes Sreenath, Sudheer B, MD  fluticasone (FLONASE) 50 MCG/ACT nasal spray SHAKE LIQUID AND USE 2 SPRAYS IN EACH NOSTRIL DAILY AS NEEDED FOR ALLERGIES OR RHINITIS 06/05/20  Yes Glean Hess, MD  gabapentin (NEURONTIN) 100 MG capsule Take 100 mg by mouth daily. 05/21/20  Yes [provider]  guaiFENesin (MUCINEX) 600 MG 12 hr  tablet Take 1 tablet (600 mg total) by mouth 2 (two) times daily for 15 days. 08/16/20 08/31/20 Yes Carrie Mew, MD  hydrochlorothiazide (MICROZIDE) 12.5 MG capsule Take 12.5 mg by mouth daily.   Yes [provider]  metoCLOPramide (REGLAN) 10 MG tablet Take 1 tablet (10 mg total) by mouth every 6 (six) hours as needed. 08/16/20 11/14/20 Yes Carrie Mew, MD  metoprolol tartrate (LOPRESSOR) 25 MG tablet Take 0.5 tablets (12.5 mg total) by mouth 2 (two) times daily. 07/16/20 07/11/21 Yes Furth, Cadence H, PA-C  sucralfate (CARAFATE) 1 GM/10ML suspension Take 10 mLs (1 g total) by mouth 4 (four) times daily. 08/25/20 09/24/20 Yes Virgel Manifold, MD  amoxicillin-clavulanate (AUGMENTIN) 875-125 MG tablet Take 1 tablet by mouth 2 (two) times daily. Patient not taking: Reported on 08/25/2020 08/16/20   Carrie Mew, MD  apixaban (ELIQUIS) 5 MG TABS tablet Take 1 tablet (5 mg total) by mouth 2 (two) times daily. 06/24/20 07/24/20  [name], MD  folic acid (FOLVITE) 1 MG tablet Take 1 mg by mouth daily. Takes every day expect Tuesday Patient not taking: Reported on 08/25/2020    [provider]  methotrexate (RHEUMATREX) 2.5 MG tablet Take 10 mg by mouth once a week. Tuesdays Patient not taking: Reported on 08/25/2020    [provider]  pantoprazole (PROTONIX) 40 MG tablet Take 1 tablet (40 mg total) by mouth 2 (two) times daily. 08/25/20 09/24/20  Virgel Manifold, MD    Family History  Problem Relation Age of Onset   Breast cancer Sister      Social History   Tobacco Use   Smoking status: Never   Smokeless tobacco: Never  Vaping Use   Vaping Use: Never used  Substance Use Topics   Alcohol use: No   Drug use: No    Allergies as of 08/25/2020   (No Known Allergies)    Physical Examination:   BP (!) 154/73   Pulse 60   Temp 98.1 F (36.7 C) (Oral)   Wt 119 lb 9.6 oz (54.3 kg)   BMI 21.19 kg/m   Constitutional: General:   Alert,   Well-developed, well-nourished, pleasant and cooperative in NAD BP (!) 154/73   Pulse 60   Temp 98.1 F (36.7 C) (Oral)   Wt 119 lb 9.6 oz (54.3 kg)   BMI 21.19 kg/m   Eyes:  Sclera clear, no icterus.   Conjunctiva pink. PERRLA  Ears:  No scars, lesions or masses, Normal auditory acuity. Nose:  No deformity, discharge, or lesions. Mouth:  No deformity or lesions, oropharynx pink & moist.  Neck:  Supple; no masses or thyromegaly.  Respiratory: Normal respiratory effort, Normal percussion  Gastrointestinal:  Normal bowel sounds.  No bruits.  Soft, non-tender and non-distended without masses, hepatosplenomegaly or hernias noted.  No guarding or rebound tenderness.     Cardiac: No clubbing  or edema.  No cyanosis. Normal posterior tibial pedal pulses noted.  Lymphatic:  No significant cervical or axillary adenopathy.  Psych:  Alert and cooperative. Normal mood and affect.  Musculoskeletal:  Normal gait. Head normocephalic, atraumatic. Symmetrical without gross deformities. 5/5 Upper and Lower extremity strength bilaterally.  Skin: Warm. Intact without significant lesions or rashes. No jaundice.  Neurologic:  Face symmetrical, tongue midline, Normal sensation to touch;  grossly normal neurologically.  Psych:  Alert and oriented x3, Alert and cooperative. Normal mood and affect.  Labs: CMP     Component Value Date/Time   NA 140 08/16/2020 1547   NA 142 01/09/2020 1026   NA 140 10/07/2013 1143   K 3.5 08/16/2020 1547   K 4.2 10/07/2013 1143   CL 105 08/16/2020 1547   CL 106 10/07/2013 1143   CO2 28 08/16/2020 1547   CO2 26 10/07/2013 1143   GLUCOSE 116 (H) 08/16/2020 1547   GLUCOSE 117 (H) 10/07/2013 1143   BUN 22 08/16/2020 1547   BUN 24 01/09/2020 1026   BUN 19 (H) 10/07/2013 1143   CREATININE 1.04 (H) 08/16/2020 1547   CREATININE 0.69 10/07/2013 1143   CALCIUM 9.1 08/16/2020 1547   CALCIUM 8.9 10/07/2013 1143   PROT 7.5 07/09/2020 1005   PROT 6.9 01/09/2020 1026    PROT 8.6 (H) 01/04/2013 1042   ALBUMIN 3.9 07/09/2020 1005   ALBUMIN 4.4 01/09/2020 1026   ALBUMIN 3.0 (L) 01/04/2013 1042   AST 24 07/09/2020 1005   AST 67 (H) 01/04/2013 1042   ALT 17 07/09/2020 1005   ALT 62 01/04/2013 1042   ALKPHOS 98 07/09/2020 1005   ALKPHOS 136 01/04/2013 1042   BILITOT 0.7 07/09/2020 1005   BILITOT 0.5 01/09/2020 1026   BILITOT 0.4 01/04/2013 1042   GFRNONAA 53 (L) 08/16/2020 1547   GFRNONAA >60 10/07/2013 1143   GFRAA 78 01/09/2020 1026   GFRAA >60 10/07/2013 1143   Lab Results  Component Value Date   WBC 12.3 (H) 08/16/2020   HGB 13.9 08/16/2020   HCT 41.3 08/16/2020   MCV 89.4 08/16/2020   PLT 232 08/16/2020   GI profile negative  H. pylori breath test negative  Assessment and Plan:   KAHLANI GRABER is a 87 y.o. y/o female here for follow-up of nausea  I had an extensive discussion with patient and family and we discussed that her predominant nausea symptoms are likely related to the very recent multiple issues that she has gone through including hip fracture, recent UTI requiring antibiotics, A. fib, and now recent COPD exacerbation for which she was also given Augmentin by the ER.  Recent ER note personally reviewed  Her CT scan has not shown any alarming features such as gastric mass, gastric outlet obstruction or large ulcers, which is reassuring  Patient and family specifically question her Eliquis as they state symptoms seem to have started right after Eliquis was started.  I have messaged her primary care provider and cardiologist to inquire about this and obtain assistance in coordinating her care given ongoing symptoms.  Recent cardiology note from 07/30/2020 personally reviewed and patient is on Eliquis for A. fib with CHA2DS2-VASc of 4-5 and amiodarone was previously stopped for GI issues.  Antinausea medications have not helped her symptoms either  We will increase PPI to twice a day for 30 days to see if it improves her symptoms,  and add Carafate  I will also obtain right upper quadrant ultrasound at this time and obtain upper  GI series.  This would allow for noninvasive evaluation of the GI tract  At her age, and with current comorbidities, and recent reassuring CT scans, with likely low yield of endoscopic procedures, benefits of procedure unlikely to outweigh risks.  In addition, she does not have any dysphagia, blood in stool to indicate urgent endoscopy   [name]

## 2020-08-25 NOTE — Telephone Encounter (Signed)
Patient appt moved to this morning to see GI.

## 2020-08-26 ENCOUNTER — Ambulatory Visit: Payer: Medicare PPO | Admitting: Gastroenterology

## 2020-08-26 ENCOUNTER — Encounter: Payer: Self-pay | Admitting: Gastroenterology

## 2020-08-26 NOTE — Progress Notes (Signed)
Please see recent progress note.  Dr. Saunders Revel from cardiology did get back to me via epic messaging and he states nausea can be associated with Eliquis and therefore, his clinic will reach out to the patient to discuss symptoms and possible change in her Eliquis to alternative medication.

## 2020-08-27 ENCOUNTER — Ambulatory Visit: Payer: Self-pay | Admitting: *Deleted

## 2020-08-27 ENCOUNTER — Ambulatory Visit
Admission: RE | Admit: 2020-08-27 | Discharge: 2020-08-27 | Disposition: A | Discharge status: Home / Self Care | Payer: Medicare PPO | Source: Ambulatory Visit | Attending: Gastroenterology | Admitting: Gastroenterology

## 2020-08-27 ENCOUNTER — Telehealth: Payer: Self-pay

## 2020-08-27 ENCOUNTER — Other Ambulatory Visit: Payer: Self-pay

## 2020-08-27 DIAGNOSIS — R110 Nausea: Secondary | ICD-10-CM | POA: Insufficient documentation

## 2020-08-27 DIAGNOSIS — R109 Unspecified abdominal pain: Secondary | ICD-10-CM | POA: Diagnosis not present

## 2020-08-27 NOTE — Telephone Encounter (Signed)
Patient's husband returned call from [place]. Reviewed message from 11:44 am that patient has appt tomorrow 08/28/20 at 9:00 am and does not need this appt . Patient husband agreed patient does not need appt. Patient reports he is unaware his wife has pulmonologist and will follow up as needed. Appt for 08/28/20 will be canceled.

## 2020-08-27 NOTE — Telephone Encounter (Signed)
Patient called and husband answered. Advised of the message below, he says he already spoke to someone and doesn't understand how pulmonary got involved.

## 2020-08-27 NOTE — Telephone Encounter (Signed)
Called pt she has an appt tomorrow 08/28/2020 @ 9:00AM. Pt does not need this appt pt has a pulmonologist that she sees and would need to follow up with them.  [facility] nurse may give results to patient if they return call to clinic, a CRM has been created.  KP

## 2020-08-27 NOTE — Telephone Encounter (Signed)
Noted  KP 

## 2020-08-28 ENCOUNTER — Ambulatory Visit: Payer: Medicare PPO | Admitting: Internal Medicine

## 2020-08-28 ENCOUNTER — Other Ambulatory Visit (INDEPENDENT_AMBULATORY_CARE_PROVIDER_SITE_OTHER): Payer: Medicare PPO | Admitting: Internal Medicine

## 2020-08-28 DIAGNOSIS — E876 Hypokalemia: Secondary | ICD-10-CM | POA: Diagnosis not present

## 2020-08-28 DIAGNOSIS — E782 Mixed hyperlipidemia: Secondary | ICD-10-CM

## 2020-08-28 DIAGNOSIS — S72012D Unspecified intracapsular fracture of left femur, subsequent encounter for closed fracture with routine healing: Secondary | ICD-10-CM | POA: Diagnosis not present

## 2020-08-28 DIAGNOSIS — I480 Paroxysmal atrial fibrillation: Secondary | ICD-10-CM

## 2020-08-28 DIAGNOSIS — L400 Psoriasis vulgaris: Secondary | ICD-10-CM

## 2020-08-28 DIAGNOSIS — J479 Bronchiectasis, uncomplicated: Secondary | ICD-10-CM | POA: Diagnosis not present

## 2020-08-28 DIAGNOSIS — E785 Hyperlipidemia, unspecified: Secondary | ICD-10-CM | POA: Diagnosis not present

## 2020-08-28 DIAGNOSIS — K219 Gastro-esophageal reflux disease without esophagitis: Secondary | ICD-10-CM

## 2020-08-28 DIAGNOSIS — I10 Essential (primary) hypertension: Secondary | ICD-10-CM | POA: Diagnosis not present

## 2020-08-28 DIAGNOSIS — J3089 Other allergic rhinitis: Secondary | ICD-10-CM

## 2020-08-28 DIAGNOSIS — D6869 Other thrombophilia: Secondary | ICD-10-CM

## 2020-08-28 DIAGNOSIS — I700 Atherosclerosis of aorta: Secondary | ICD-10-CM

## 2020-08-28 DIAGNOSIS — K5900 Constipation, unspecified: Secondary | ICD-10-CM | POA: Diagnosis not present

## 2020-08-28 DIAGNOSIS — B009 Herpesviral infection, unspecified: Secondary | ICD-10-CM

## 2020-08-28 NOTE — Progress Notes (Signed)
Received orders from [facility]. Start of care 06/29/20.   Orders from 08/28/20 through 10/26/20 are reviewed, signed and faxed.

## 2020-08-31 ENCOUNTER — Telehealth: Payer: Self-pay | Admitting: Physician Assistant

## 2020-08-31 ENCOUNTER — Telehealth: Payer: Self-pay | Admitting: Internal Medicine

## 2020-08-31 NOTE — Telephone Encounter (Signed)
error 

## 2020-08-31 NOTE — Telephone Encounter (Signed)
Patients daughter calling in to report patients is still feeling nauseous (been going on since April). Patients daughter strongly feels like this is related to her eliquis. Patient is scheduled for 7/11 and placed on the wait list however, would like to discuss any possible options. Patient is fatigued and has no appetite with this feeling.   Please advise

## 2020-09-01 DIAGNOSIS — S72012D Unspecified intracapsular fracture of left femur, subsequent encounter for closed fracture with routine healing: Secondary | ICD-10-CM | POA: Diagnosis not present

## 2020-09-01 DIAGNOSIS — J479 Bronchiectasis, uncomplicated: Secondary | ICD-10-CM | POA: Diagnosis not present

## 2020-09-01 DIAGNOSIS — I480 Paroxysmal atrial fibrillation: Secondary | ICD-10-CM | POA: Diagnosis not present

## 2020-09-01 DIAGNOSIS — I10 Essential (primary) hypertension: Secondary | ICD-10-CM | POA: Diagnosis not present

## 2020-09-01 DIAGNOSIS — E876 Hypokalemia: Secondary | ICD-10-CM | POA: Diagnosis not present

## 2020-09-01 DIAGNOSIS — K5900 Constipation, unspecified: Secondary | ICD-10-CM | POA: Diagnosis not present

## 2020-09-01 DIAGNOSIS — E785 Hyperlipidemia, unspecified: Secondary | ICD-10-CM | POA: Diagnosis not present

## 2020-09-01 DIAGNOSIS — K219 Gastro-esophageal reflux disease without esophagitis: Secondary | ICD-10-CM | POA: Diagnosis not present

## 2020-09-01 NOTE — Telephone Encounter (Signed)
Daughter, Butch Penny, not on pt's DPR. Received verbal consent from pt that I may speak with dtr.  Per Butch Penny, pt has continued nausea (no vomitting) since April 2022. Daughter feels r/t Eliquis.   Pt saw Cadence Furth, [place] in office 07/30/20 and 07/16/20.  Per notes, pt held all medications (including cardiac medications Eliquis, Metoprolol, HCTZ) x 1 week without improvement of GI symptoms.   Pt had upper GI yesterday, per pt this was normal. And Abdominal US scheduled tomorrow 09/02/20.  Pt was started on Carafate and Pantoprazole dose increased by GI on 08/25/20.  Per dtr, pt did have "one good day" since med changes, however, still c/o continued nausea.  Pt does also take PRN anti-nausea medication, but dtr unsure of which she is on at this time.   Pt scheduled to see Marrianne Mood, PA 09/14/20 with wait list in place.  Notified daughter I will send to provider for any further recc in the meantime.  Dtr appreciative and voiced understanding.

## 2020-09-02 ENCOUNTER — Other Ambulatory Visit: Payer: Self-pay

## 2020-09-02 ENCOUNTER — Ambulatory Visit (HOSPITAL_BASED_OUTPATIENT_CLINIC_OR_DEPARTMENT_OTHER)
Admission: RE | Admit: 2020-09-02 | Discharge: 2020-09-02 | Disposition: A | Discharge status: Home / Self Care | Payer: Medicare PPO | Source: Ambulatory Visit | Attending: Gastroenterology | Admitting: Gastroenterology

## 2020-09-02 DIAGNOSIS — R110 Nausea: Secondary | ICD-10-CM | POA: Diagnosis not present

## 2020-09-02 DIAGNOSIS — R109 Unspecified abdominal pain: Secondary | ICD-10-CM | POA: Insufficient documentation

## 2020-09-02 DIAGNOSIS — R1011 Right upper quadrant pain: Secondary | ICD-10-CM | POA: Diagnosis not present

## 2020-09-02 NOTE — Telephone Encounter (Signed)
Spoke to pt's daughter, Butch Penny, made aware of Dr. Darnelle Bos recc below.  Butch Penny voiced understanding of incr risk of anticoagulation holiday and of all information discussed below.  She will have pt hold Eliquis x 1 week and will follow up as scheduled with Marrianne Mood, PA on 09/14/20.  Butch Penny has no further questions at this time.

## 2020-09-02 NOTE — Telephone Encounter (Signed)
Though not ideal due to CHA2DS2-VASc score of 4-5, if [name] and her family remain very concerned that apixaban is causing nausea, I recommend holding the medication for at least a week to see if symptoms resolve.  Patient and family should note that discontinuation of anticoagulation does increase the risk for thrombotic events including strokes.  Notably, she was not in atrial fibrillation the last time she was seen in our office last month.  If nausea persist despite repeating a medication holiday, it would stand to reason that apixaban is not driving the patient's nausea.  If nausea does resolve, alternative stroke prophylaxis such as rivaroxaban, dabigatran, or warfarin will need to be discussed when the patient is seen in follow-up by [name] next month.  Olivia Bush, MD [facility]

## 2020-09-04 DIAGNOSIS — E785 Hyperlipidemia, unspecified: Secondary | ICD-10-CM | POA: Diagnosis not present

## 2020-09-04 DIAGNOSIS — K219 Gastro-esophageal reflux disease without esophagitis: Secondary | ICD-10-CM | POA: Diagnosis not present

## 2020-09-04 DIAGNOSIS — K5900 Constipation, unspecified: Secondary | ICD-10-CM | POA: Diagnosis not present

## 2020-09-04 DIAGNOSIS — I480 Paroxysmal atrial fibrillation: Secondary | ICD-10-CM | POA: Diagnosis not present

## 2020-09-04 DIAGNOSIS — E876 Hypokalemia: Secondary | ICD-10-CM | POA: Diagnosis not present

## 2020-09-04 DIAGNOSIS — I10 Essential (primary) hypertension: Secondary | ICD-10-CM | POA: Diagnosis not present

## 2020-09-04 DIAGNOSIS — J479 Bronchiectasis, uncomplicated: Secondary | ICD-10-CM | POA: Diagnosis not present

## 2020-09-04 DIAGNOSIS — S72012D Unspecified intracapsular fracture of left femur, subsequent encounter for closed fracture with routine healing: Secondary | ICD-10-CM | POA: Diagnosis not present

## 2020-09-08 DIAGNOSIS — K5900 Constipation, unspecified: Secondary | ICD-10-CM | POA: Diagnosis not present

## 2020-09-08 DIAGNOSIS — I10 Essential (primary) hypertension: Secondary | ICD-10-CM | POA: Diagnosis not present

## 2020-09-08 DIAGNOSIS — E785 Hyperlipidemia, unspecified: Secondary | ICD-10-CM | POA: Diagnosis not present

## 2020-09-08 DIAGNOSIS — K219 Gastro-esophageal reflux disease without esophagitis: Secondary | ICD-10-CM | POA: Diagnosis not present

## 2020-09-08 DIAGNOSIS — E876 Hypokalemia: Secondary | ICD-10-CM | POA: Diagnosis not present

## 2020-09-08 DIAGNOSIS — I480 Paroxysmal atrial fibrillation: Secondary | ICD-10-CM | POA: Diagnosis not present

## 2020-09-08 DIAGNOSIS — J479 Bronchiectasis, uncomplicated: Secondary | ICD-10-CM | POA: Diagnosis not present

## 2020-09-08 DIAGNOSIS — S72012D Unspecified intracapsular fracture of left femur, subsequent encounter for closed fracture with routine healing: Secondary | ICD-10-CM | POA: Diagnosis not present

## 2020-09-10 DIAGNOSIS — E785 Hyperlipidemia, unspecified: Secondary | ICD-10-CM | POA: Diagnosis not present

## 2020-09-10 DIAGNOSIS — J479 Bronchiectasis, uncomplicated: Secondary | ICD-10-CM | POA: Diagnosis not present

## 2020-09-10 DIAGNOSIS — K219 Gastro-esophageal reflux disease without esophagitis: Secondary | ICD-10-CM | POA: Diagnosis not present

## 2020-09-10 DIAGNOSIS — E876 Hypokalemia: Secondary | ICD-10-CM | POA: Diagnosis not present

## 2020-09-10 DIAGNOSIS — I480 Paroxysmal atrial fibrillation: Secondary | ICD-10-CM | POA: Diagnosis not present

## 2020-09-10 DIAGNOSIS — I10 Essential (primary) hypertension: Secondary | ICD-10-CM | POA: Diagnosis not present

## 2020-09-10 DIAGNOSIS — S72012D Unspecified intracapsular fracture of left femur, subsequent encounter for closed fracture with routine healing: Secondary | ICD-10-CM | POA: Diagnosis not present

## 2020-09-10 DIAGNOSIS — K5900 Constipation, unspecified: Secondary | ICD-10-CM | POA: Diagnosis not present

## 2020-09-13 NOTE — Progress Notes (Signed)
Office Visit    Patient Name: [name] Date of Encounter: 09/14/2020  PCP:  Glean Hess, MD   [name]  Cardiologist:  Nelva Bush, MD  Advanced Practice Provider:  No care team member to display Electrophysiologist:  None      Chief Complaint    Chief Complaint  Patient presents with   Follow-up    Patient reports swelling in her feet over the past several days.    87 y.o. female with history of atrial fibrillation on Eliquis, hypertension, GERD, COPD, hyperlipidemia, history of left breast cancer s/p chemo and radiation, and who is being seen today for follow-up after report of nausea with Eliquis.  Past Medical History    Past Medical History:  Diagnosis Date   Acute metabolic encephalopathy 73/71/0626   Allergy    Bronchiectasis ([place])    Cancer (HCC)    left breast cancer    COPD (chronic obstructive pulmonary disease) (HCC)    GERD (gastroesophageal reflux disease)    Hyperlipemia    Hypertension    Intractable nausea and vomiting 06/20/2018   Past Surgical History:  Procedure Laterality Date   BREAST LUMPECTOMY Left 2008   CHOLECYSTECTOMY     HIP PINNING,CANNULATED Left 06/05/2020   Procedure: CANNULATED HIP PINNING;  Surgeon: Hessie Knows, MD;  Location: [facility] ORS;  Service: Orthopedics;  Laterality: Left;   PARTIAL HYSTERECTOMY     TEE WITHOUT CARDIOVERSION N/A 06/10/2020   Procedure: TRANSESOPHAGEAL ECHOCARDIOGRAM (TEE);  Surgeon: Kate Sable, MD;  Location: [facility] ORS;  Service: Cardiovascular;  Laterality: N/A;    Allergies  No Known Allergies  History of Present Illness    [name] is a 87 y.o. female with PMH as above.  She has history of atrial fibrillation on Eliquis, hypertension, hyperlipidemia, GERD, COPD, left breast cancer s/p chemo and radiation.  Prior to her 06/2020 admission, she had not been seen by cardiologist in the past.  She denied a history of ischemic cardiomyopathy.    She lives  at home with her husband.  She used a cane or walker at home.  No prior tobacco, alcohol, or drug use.  She reported a family history of atrial fibrillation in her sister.    On 06/2020, she presented to [facility] ED s/p a fall and found to have L hip fx.  D-dimer elevated with chest CTA negative for pulmonary embolism.  On 4/1, she underwent left femoral neck pinning.  On 4/5, she was in new onset atrial fibrillation/flutter with RVR thought precipitated by left femoral hip fracture.  Digoxin 250 MCG x1 was administered to assist for rate control.  She converted to NSR during admission with amiodarone added to maintain NSR.  Given a CHA2DS2-VASc score of at least 78 (age x2, gender, hypertension, coronary calcium noted on CT, female), she was transitioned to Eliquis 5 mg twice daily.  Volume overload was suspected given small pleural effusions on chest CT and crackles on exam.  Due to soft BP, net even to slightly negative fluid balance was recommended.  At discharge, she was continued on amiodarone 400 mg twice daily x6 days, subsequently reduced to 200 mg twice daily.    It was noted that, as an outpatient, she should be weaned to 200 mg daily.  Outpatient sleep evaluation was noted in the setting of RVSP elevation as below.  Low-dose oral diuretic was noted to possibly be needed in the future.  Subsequent echo showed EF 65 to  70%, NRWMA, mild LVH, G2 DD.  RVSP 49.5 mmHg/pulmonary hypertension, mild LAE.  Mitral valve was noted to be due to degenerative with mild mitral stenosis.  Estimated RAP 3 mmHg.  She was readmitted 4/15-4/24 shortness of breath, UTI, and AMS.  Foley catheter was removed and she was treated for yeast infection.  Mental status improved and she was discharged home.  She saw her PCP and reported GI issues with amiodarone discontinued.  Subsequently, she stopped all medications for a week to see if this would improve her GI issues.  This did not help, so she restarted her medications.     On 07/16/2020, she was seen in clinic and reported ongoing issues despite discontinuation of her amiodarone.  She was maintaining NSR.  She had restarted all of her cardiac medications today before clinic.  When seen 07/30/2020, she reported that her GI issues had stopped 3 days prior.  BP at home 120/60.  BP in clinic elevated with patient report that she had not taken her medications that morning.  She reported ongoing issues with orthopnea.  No medication changes made.  On 08/31/2020, she called the office to report nausea, attributed to apixaban.  Recommendation by her primary cardiologist was to hold the medication for at least 1 week to see if symptoms resolve.  Patient and family were notified that discontinuation of anticoagulation increase the risk of thrombolic events, including strokes.    It was noted that if nausea persisted despite Eliquis holiday, it would stand reason that apixaban is not the driving force of the patient's nausea.  If resolution of nausea, alternative stroke prophylaxis including rivaroxaban, dabigatran, or warfarin should be pursued.  Today, 09/14/2020, she returns to clinic and notes improvement in her previous nausea.  She reports symptoms associated with Eliquis as including swollen red lids, red/erythematic cheeks, runny nose, nausea, weakness, fatigue, and hair loss.  She was barely able to eat anything during this time and until her Eliquis holiday.  We discussed that many of the symptoms reviewed today are more likely attributed to alternative etiologies, including thyroid dysfunction, and with recommendation to reach out to PCP regarding upcoming thyroid labs.  Also discussed his recommendation to only change 1 medication at a time when undergoing a holiday of a medication, given changing multiple medications at once will add confounding factors to her results.  She notes changes to Reglan, Protonix, carafe 8, albuterol, Augmentin, folic acid, gabapentin, Rheumatrex at  the same time as her holding of Eliquis.  She does feel strongly that her symptoms are linked with her Eliquis however.  She reports that her energy has improved enough after discontinuation of Eliquis to be able to go to a dog birthday party over the weekend, which she enjoyed.  Since stopping her Eliquis, she reports that she has been able to eat more, and as a result has been gaining back some weight.  EKG today NSR at 66 bpm.    She denies any chest pain, racing heart rate, presyncope, syncope, PND, lower extremity edema, or signs or symptoms consistent with bleeding.  She reports that she is still waking nauseated, though significantly improved.  Home SBP 118-140 with increase at times to 136, DBP 60s to 70s.    We discussed the rx changes above, notably restart of her PPI- deferred for now.  We discussed that restart of PPI makes it difficult to ascertain if her improvement in symptoms is due to Protonix holiday versus Eliquis holiday versus Xarelto.  She attributes  her symptoms to the Eliquis.  We will continue to hold her PPI.  If she tolerates Xarelto well as below, we will restart Protonix at that time. She denies that her symptoms are associated with Lipitor.  She reports that she is not taking folic acid.  She is not taking gabapentin.  She has stopped taking methotrexate sodium.  She only takes metoclopramide HCl/Reglan as needed, which is a change from previous visits, though she denies that her symptoms are associated with this change.  She has stopped taking Carafate, though she denies associated symptoms with this change.    On review of alternatives to Eliquis today, she is hesitant to trial Xarelto versus dabigatran, though after review of associated stroke prophylaxis associated with these medications, she is agreeable to trial Xarelto.  On review of her most recent vitals, weights, and age, she qualifies for reduced dosing of NOACs.  See below.  Home Medications   Current  Outpatient Medications  Medication Instructions   apixaban (ELIQUIS) 5 mg, Oral, 2 times daily   atorvastatin (LIPITOR) 20 mg, Oral, Daily   cetirizine (ZYRTEC) 10 mg, Oral, Daily   fluticasone (FLONASE) 50 MCG/ACT nasal spray SHAKE LIQUID AND USE 2 SPRAYS IN EACH NOSTRIL DAILY AS NEEDED FOR ALLERGIES OR RHINITIS   hydrochlorothiazide (MICROZIDE) 12.5 mg, Oral, Daily   metoCLOPramide (REGLAN) 10 mg, Oral, Every 6 hours PRN   metoprolol tartrate (LOPRESSOR) 12.5 mg, Oral, 2 times daily     Review of Systems    She reports improvement in nausea, swollen red lower lids, red cheeks, runny nose, nausea, weakness, fatigue, hair loss.  She reports that the improvement of all of the symptoms is associated with the Eliquis. Changes documented to her Carafate, Protonix, Reglan, Augmentin, and albuterol medications - on review, she does not feel that her improvement in her symptoms are associated with these changes. She denies chest pain, palpitations, dyspnea, pnd, orthopnea, dizziness, syncope, edema, weight gain, or early satiety.  While nausea is improved, she still wakes with nausea on a daily basis.  She reports that she quit her Protonix at the same time as quitting her NOAC.  All other systems reviewed and are otherwise negative except as noted above.  Physical Exam    VS:  BP 140/60 (BP Location: Right Arm, Patient Position: Sitting, Cuff Size: Normal)   Pulse 66   Ht 5\' 3"  (1.6 m)   Wt 121 lb (54.9 kg)   SpO2 96%   BMI 21.43 kg/m  , BMI Body mass index is 21.43 kg/m. GEN: Well nourished, well developed, in no acute distress.  Joined by her husband. HEENT: normal. Neck: Supple, no JVD, carotid bruits, or masses. Cardiac: RRR, no murmurs, rubs, or gallops. No clubbing, cyanosis, edema.  Radials/DP/PT 2+ and equal bilaterally.  Respiratory:  Respirations regular and unlabored, clear to auscultation bilaterally. GI: Soft, nontender, nondistended, BS + x 4. MS: no deformity or  atrophy. Skin: warm and dry, no rash. Neuro:  Strength and sensation are intact. Psych: Normal affect.  Accessory Clinical Findings    ECG personally reviewed by me today - NSR, 66 bpm, poor R wave progression V1 to V3- no acute changes.  VITALS Reviewed today   Temp Readings from Last 3 Encounters:  08/25/20 98.1 F (36.7 C) (Oral)  08/16/20 97.9 F (36.6 C) (Oral)  08/06/20 98.2 F (36.8 C) (Oral)   BP Readings from Last 3 Encounters:  09/14/20 140/60  08/25/20 (!) 154/73  08/16/20 (!) 180/85   Pulse  Readings from Last 3 Encounters:  09/14/20 66  08/25/20 60  08/16/20 88    Wt Readings from Last 3 Encounters:  09/14/20 121 lb (54.9 kg)  08/25/20 119 lb 9.6 oz (54.3 kg)  08/16/20 117 lb (53.1 kg)     LABS  reviewed today   Lab Results  Component Value Date   WBC 12.3 (H) 08/16/2020   HGB 13.9 08/16/2020   HCT 41.3 08/16/2020   MCV 89.4 08/16/2020   PLT 232 08/16/2020   Lab Results  Component Value Date   CREATININE 1.04 (H) 08/16/2020   BUN 22 08/16/2020   NA 140 08/16/2020   K 3.5 08/16/2020   CL 105 08/16/2020   CO2 28 08/16/2020   Lab Results  Component Value Date   ALT 17 07/09/2020   AST 24 07/09/2020   ALKPHOS 98 07/09/2020   BILITOT 0.7 07/09/2020   Lab Results  Component Value Date   CHOL 206 (H) 01/09/2020   HDL 93 01/09/2020   LDLCALC 98 01/09/2020   TRIG 86 01/09/2020   CHOLHDL 2.2 01/09/2020    No results found for: HGBA1C Lab Results  Component Value Date   TSH 3.538 06/09/2020     STUDIES/PROCEDURES reviewed today   Echocardiogram 06/10/2020  1. Left ventricular ejection fraction, by estimation, is 65 to 70%. The  left ventricle has normal function. The left ventricle has no regional  wall motion abnormalities. There is mild left ventricular hypertrophy.  Left ventricular diastolic parameters  are consistent with Grade II diastolic dysfunction (pseudonormalization).   2. Right ventricular systolic function is normal. The  right ventricular  size is normal. There is moderately elevated pulmonary artery systolic  pressure. The estimated right ventricular systolic pressure is 16.9 mmHg.   3. Left atrial size was mildly dilated.   4. The mitral valve is degenerative. No evidence of mitral valve  regurgitation. Mild mitral stenosis.   5. The aortic valve is tricuspid. Aortic valve regurgitation is not  visualized.   6. The inferior vena cava is normal in size with greater than 50%  respiratory variability, suggesting right atrial pressure of 3 mmHg.   MPI 10/08/2013 Pharmacological myocardial perfusion study without significant ischemia No significant wall motion abnormality noted EF 82% LV SF normal Overall low risk scan  Assessment & Plan   Atrial fibrillation with RVR -- Reports only occasional palpitations.  Onset 4/5, converting on digoxin and beta-blocker.  Digoxin discontinued since that time.  Amiodarone added to maintain NSR during admission, discontinued since that time.  She continues on Lopressor with rate well controlled today at 66 bpm and NSR.  Some concern noted today regarding atrial fibrillation, given she has not had a recurrent episode.  We reviewed the risk of stroke is still associated with any past history of atrial fibrillation, especially with consideration of her CHA2DS2-VASc score of at least 6 (DD, hypertension, age x2, vascular, female).  Risks and benefits of ongoing anticoagulation discussed with strong encouragement for retrial of alternatives to Eliquis.  She reports that she has not tolerated Eliquis well.  In addition to nausea, she reports swollen/erythematous red lid / cheeks, runny nose, nausea, weakness, fatigue, and hair loss.  We discussed that some of these symptoms do associated with thyroid dysfunction with recommendation that she reach out to her PCP for repeat labs.  Very low suspicion that her reported sx are due to her Eliquis, as discussed today. Regardless, she reports  improvement in symptoms since holding Eliquis.  Given this,  she requests to discontinue Eliquis.  Alternatives discussed at length, including rivaroxaban and dabigatran.  She is agreeable to rivaroxaban with creatinine clearance calculated today at 35 mL per Cockcroft-Gault equation (creatinine 1.04, weight 121 pounds, age 66, female).  Given CrCl less than 50, this qualifies her for reduced dosing rivaroxaban 15 mg q. dinner.  At RTC, repeat BMET and CBC.samples provided today for rivaroxaban 15 mg daily.  She will notify the office if any recurrent symptoms.  If in the event that she has poor tolerance of rivaroxaban, dabigatran will be trialed at that time.  We did discuss preference to avoid warfarin if possible, and with consideration of frequent INR checks needed to be on this medication.   Diastolic dysfunction/PAH -- Denies any signs or symptoms consistent with volume overload.  Euvolemic on exam.  SBP today suboptimal and will reassess at RTC.  Echocardiogram as above shows EF normal, G2 DD, RVSP 49.5 mmHg.  Will defer any medication changes at this time.  At RTC, consider addition of standing diuretic as indicated  +/1 OP sleep study.  Deferred at this time given plan to retrial an anticoagulant medication.  Essential hypertension, goal BP less than 130/80 -- SBP today suboptimal.  Recommend monitor BP at home.  Monitor salt/fluids.  Continue current medications.  Reassess at RTC.  Consider outpatient sleep study.  Previous echo as above.  GI issues --Discussed recommendation to restart PPI for protection of GI health.  Will defer until after able to reassess symptoms on rivaroxaban  Medication changes: Start reduced dose rivaroxaban 15 q. dinner.  Discontinue Eliquis.  Avoid any medication changes during trial of rivaroxaban.  If well-tolerated, additional medication changes/work-up can be attempted at that time.  Avoid changes to any other medications during rivaroxaban trial, as suspect these  multiple medication changes at once or confounding her results. Labs ordered: None Studies / Imaging ordered: None Future considerations: Repeat CBC/BMET at RTC, sleep study Disposition: RTC 4 weeks  *Please be aware that the above documentation was completed voice recognition software and may contain dictation errors.    Arvil Chaco, PA-C 09/14/2020

## 2020-09-14 ENCOUNTER — Ambulatory Visit: Payer: Medicare PPO | Admitting: Physician Assistant

## 2020-09-14 ENCOUNTER — Other Ambulatory Visit: Payer: Self-pay

## 2020-09-14 ENCOUNTER — Other Ambulatory Visit: Payer: Self-pay | Admitting: Internal Medicine

## 2020-09-14 ENCOUNTER — Encounter: Payer: Self-pay | Admitting: Physician Assistant

## 2020-09-14 VITALS — BP 140/60 | HR 66 | Ht 63.0 in | Wt 121.0 lb

## 2020-09-14 DIAGNOSIS — R110 Nausea: Secondary | ICD-10-CM

## 2020-09-14 DIAGNOSIS — K5900 Constipation, unspecified: Secondary | ICD-10-CM | POA: Diagnosis not present

## 2020-09-14 DIAGNOSIS — Z79899 Other long term (current) drug therapy: Secondary | ICD-10-CM | POA: Diagnosis not present

## 2020-09-14 DIAGNOSIS — B009 Herpesviral infection, unspecified: Secondary | ICD-10-CM

## 2020-09-14 DIAGNOSIS — L400 Psoriasis vulgaris: Secondary | ICD-10-CM

## 2020-09-14 DIAGNOSIS — K219 Gastro-esophageal reflux disease without esophagitis: Secondary | ICD-10-CM

## 2020-09-14 DIAGNOSIS — S72002S Fracture of unspecified part of neck of left femur, sequela: Secondary | ICD-10-CM

## 2020-09-14 DIAGNOSIS — S72012D Unspecified intracapsular fracture of left femur, subsequent encounter for closed fracture with routine healing: Secondary | ICD-10-CM | POA: Diagnosis not present

## 2020-09-14 DIAGNOSIS — Z789 Other specified health status: Secondary | ICD-10-CM | POA: Diagnosis not present

## 2020-09-14 DIAGNOSIS — I10 Essential (primary) hypertension: Secondary | ICD-10-CM

## 2020-09-14 DIAGNOSIS — J449 Chronic obstructive pulmonary disease, unspecified: Secondary | ICD-10-CM | POA: Diagnosis not present

## 2020-09-14 DIAGNOSIS — I480 Paroxysmal atrial fibrillation: Secondary | ICD-10-CM

## 2020-09-14 DIAGNOSIS — I5032 Chronic diastolic (congestive) heart failure: Secondary | ICD-10-CM

## 2020-09-14 DIAGNOSIS — I272 Pulmonary hypertension, unspecified: Secondary | ICD-10-CM | POA: Diagnosis not present

## 2020-09-14 DIAGNOSIS — Z853 Personal history of malignant neoplasm of breast: Secondary | ICD-10-CM

## 2020-09-14 DIAGNOSIS — J3089 Other allergic rhinitis: Secondary | ICD-10-CM

## 2020-09-14 DIAGNOSIS — E785 Hyperlipidemia, unspecified: Secondary | ICD-10-CM

## 2020-09-14 DIAGNOSIS — D6869 Other thrombophilia: Secondary | ICD-10-CM

## 2020-09-14 DIAGNOSIS — J479 Bronchiectasis, uncomplicated: Secondary | ICD-10-CM

## 2020-09-14 DIAGNOSIS — E782 Mixed hyperlipidemia: Secondary | ICD-10-CM

## 2020-09-14 DIAGNOSIS — E876 Hypokalemia: Secondary | ICD-10-CM | POA: Diagnosis not present

## 2020-09-14 DIAGNOSIS — I700 Atherosclerosis of aorta: Secondary | ICD-10-CM

## 2020-09-14 MED ORDER — RIVAROXABAN 20 MG PO TABS: 20.0000 mg | ORAL_TABLET | Freq: Every day | ORAL | 0 refills | Status: DC

## 2020-09-14 MED ORDER — RIVAROXABAN 15 MG PO TABS
15.0000 mg | ORAL_TABLET | Freq: Every day | ORAL | 0 refills | Status: DC
Start: 2020-09-14 — End: 2020-10-20

## 2020-09-14 NOTE — Patient Instructions (Addendum)
Medication Instructions:  - Your physician has recommended you make the following change in your medication:   1) STOP eliquis  2) START xarelto 15 mg- take 1 tablet by mouth once daily  (Please call and let us know how you are tolerating the samples and we will send in a prescription for you to the pharmacy)  Samples Given: Xarelto 20 mg Lot: [id number] Exp: 3/23 # 2 bottle   *If you need a refill on your cardiac medications before your next appointment, please call your pharmacy*   Lab Work: - none ordered  If you have labs (blood work) drawn today and your tests are completely normal, you will receive your results only by: MyChart Message (if you have MyChart) OR A paper copy in the mail If you have any lab test that is abnormal or we need to change your treatment, we will call you to review the results.   Testing/Procedures: - none ordered   Follow-Up: At [facility], you and your health needs are our priority.  As part of our continuing mission to provide you with exceptional heart care, we have created designated Provider Care Teams.  These Care Teams include your primary Cardiologist (physician) and Advanced Practice Providers (APPs -  Physician Assistants and Nurse Practitioners) who all work together to provide you with the care you need, when you need it.  We recommend signing up for the patient portal called "MyChart".  Sign up information is provided on this After Visit Summary.  MyChart is used to connect with patients for Virtual Visits (Telemedicine).  Patients are able to view lab/test results, encounter notes, upcoming appointments, etc.  Non-urgent messages can be sent to your provider as well.   To learn more about what you can do with MyChart, go to [email or web address].    Your next appointment:   1 month(s)  The format for your next appointment:   In Person  Provider:   Nelva Bush, MD/ Cadence Kathlen Mody, [place]   Other Instructions  XARELTO  (Rivaroxaban) Tablets What is this medication? RIVAROXABAN (ri va ROX a ban) prevents or treats blood clots. It is also used to lower the risk of stroke in people with AFib (atrial fibrillation). It can be used to lower the risk of heart attack or stroke in people with heart or peripheral artery disease. It belongs to a group of medications called bloodthinners. This medicine may be used for other purposes; ask your health care provider orpharmacist if you have questions. COMMON BRAND NAME(S): Xarelto, Xarelto Starter Pack What should I tell my care team before I take this medication? They need to know if you have any of these conditions: Antiphospholipid antibody syndrome Bleeding disorders Bleeding in the brain Blood clots Kidney disease Liver disease Prosthetic heart valve Recent or planned spinal or epidural procedure Stomach bleeding Take medications that treat or prevent blood clots An unusual or allergic reaction to rivaroxaban, other medications, foods, dyes, or preservatives Pregnant or trying to get pregnant Breast-feeding How should I use this medication? Take this medication by mouth. For your therapy to work as well as possible, take each dose exactly as prescribed on the prescription label. Do not skip doses. Skipping doses or stopping this medication can increase your risk of a blood clot or stroke. Keep taking this medication unless your care team tellsyou to stop. If you are taking this medication after hip or knee replacement surgery, take it with or without food. If you are taking this medication for atrial fibrillation,  take it with your evening meal. If you are taking this medication to treat blood clots, take it with food at the same time each day. If you are taking this medication for coronary artery disease or peripheral artery disease, take it with or without food at the same time every day. If you are unable to swallow your tablet, you may crush the tablet and mix it  in applesauce. Then, immediately eat the applesauce. You should eat more foodright after you eat the applesauce containing the crushed tablet. A special MedGuide will be given to you by the pharmacist with eachprescription and refill. Be sure to read this information carefully each time. Talk to your care team about the use of this medication in children. While it may be prescribed for children as young as newborns for selected conditions,precautions do apply. Overdosage: If you think you have taken too much of this medicine contact apoison control center or emergency room at once. NOTE: This medicine is only for you. Do not share this medicine with others. What if I miss a dose? If you take your medication once a day and miss a dose, take it as soon as you can. If it is almost time for your next dose, take only that dose. Do not takedouble or extra doses. If you are taking this medication twice a day to treat blood clots and miss a dose, take the missed dose as soon as you remember. In this instance, 2 tablets may be taken at the same time. The next day you should take 1 tablet twice aday. If you are taking this medication twice a day for coronary artery disease or peripheral artery disease and miss a dose, skip it. Take your next dose at the normal time. Do not take extra or 2 doses at the same time to make up for themissed dose. What may interact with this medication? Do not take this medication with any of the following: Defibrotide This medication may also interact with the following: Aspirin and aspirin-like medications Certain antibiotics like erythromycin and clarithromycin Certain medications for fungal infections like ketoconazole and itraconazole Certain medications for seizures like carbamazepine, phenytoin Certain medications that treat or prevent blood clots like warfarin, enoxaparin, dalteparin, apixaban, dabigatran, and edoxaban Conivaptan Indinavir Lopinavir; ritonavir NSAIDS,  medications for pain and inflammation, like ibuprofen or naproxen Rifampin Ritonavir SNRIs, medications for depression, like desvenlafaxine, duloxetine, levomilnacipran, venlafaxine SSRIs, medications for depression, like citalopram, escitalopram, fluoxetine, fluvoxamine, paroxetine, sertraline St. John's wort This list may not describe all possible interactions. Give your health care provider a list of all the medicines, herbs, non-prescription drugs, or dietary supplements you use. Also tell them if you smoke, drink alcohol, or use illegaldrugs. Some items may interact with your medicine. What should I watch for while using this medication? Visit your care team for regular checks on your progress. You may need blood work done while you are taking this medication. Your condition will be monitored carefully while you are receiving this medication. It is importantnot to miss any appointments. Avoid sports and activities that might cause injury while you are using this medication. Severe falls or injuries can cause unseen bleeding. Be careful when using sharp tools or knives. Consider using an Copy. Take special care brushing or flossing your teeth. Report any injuries, bruising, or redspots on the skin to your care team. Wear a medical ID bracelet or chain. Carry a card that describes yourcondition. List the medications and doses you take on the card. Tell  your dentist and dental surgeon that you are taking this medication. You should not have major dental surgery while on this medication. See your dentist to have a dental exam and fix any dental problems before starting this medication. Take good care of your teeth while on this medication. Make sureyou see your dentist for regular follow-up appointments. If you are going to need surgery or other procedure, tell your care team thatyou are using this medication. Do not become pregnant while taking this medication. Women should inform their care  team if they wish to become pregnant or think they might be pregnant. There is potential for serious harm to an unborn child. Talk to your care teamfor more information. What side effects may I notice from receiving this medication? Side effects that you should report to your care team as soon as possible: Allergic reactions-skin rash, itching, hives, swelling of the face, lips, tongue, or throat Bleeding-bloody or black, tar-like stools, vomiting blood or brown material that looks like coffee grounds, red or dark brown urine, small red or purple spots on skin, unusual bruising or bleeding Bleeding in the brain-severe headache, stiff neck, confusion, dizziness, change in vision, numbness or weakness of the face, arm, or leg, trouble speaking, trouble walking, vomiting Heavy periods This list may not describe all possible side effects. Call your doctor for medical advice about side effects. You may report side effects to FDA at1-800-FDA-1088. Where should I keep my medication? Keep out of the reach of children and pets. Store at room temperature between 20 and 25 degrees C (68 and 77 degrees F).Get rid of any unused medication after the expiration date. To get rid of medications that are no longer needed or have expired: Take the medication to a medication take-back program. Check your pharmacy or law enforcement to find a location. If you cannot return the medication, check the label or package insert to see if the medication should be thrown out in the garbage or flushed down the toilet. If you are not sure, ask your care team. If it is safe to put it in the trash, empty the medication out of the container. Mix the medication with cat litter, dirt, coffee grounds, or other unwanted substance. Seal the mixture in a bag or container. Put it in the trash. NOTE: This sheet is a summary. It may not cover all possible information. If you have questions about this medicine, talk to your doctor, pharmacist,  orhealth care provider.  2022 Elsevier/Gold Standard (2020-03-04 11:02:59)

## 2020-09-14 NOTE — Progress Notes (Signed)
Received orders from [facility]. Start of care 06/29/20.   Orders from 08/28/20 through 10/26/20 are reviewed, signed and faxed.

## 2020-09-15 ENCOUNTER — Encounter: Payer: Self-pay | Admitting: Physician Assistant

## 2020-09-16 ENCOUNTER — Telehealth: Payer: Self-pay

## 2020-09-16 DIAGNOSIS — E785 Hyperlipidemia, unspecified: Secondary | ICD-10-CM | POA: Diagnosis not present

## 2020-09-16 DIAGNOSIS — K5900 Constipation, unspecified: Secondary | ICD-10-CM | POA: Diagnosis not present

## 2020-09-16 DIAGNOSIS — E876 Hypokalemia: Secondary | ICD-10-CM | POA: Diagnosis not present

## 2020-09-16 DIAGNOSIS — S72012D Unspecified intracapsular fracture of left femur, subsequent encounter for closed fracture with routine healing: Secondary | ICD-10-CM | POA: Diagnosis not present

## 2020-09-16 DIAGNOSIS — K219 Gastro-esophageal reflux disease without esophagitis: Secondary | ICD-10-CM | POA: Diagnosis not present

## 2020-09-16 DIAGNOSIS — J479 Bronchiectasis, uncomplicated: Secondary | ICD-10-CM | POA: Diagnosis not present

## 2020-09-16 DIAGNOSIS — I10 Essential (primary) hypertension: Secondary | ICD-10-CM | POA: Diagnosis not present

## 2020-09-16 DIAGNOSIS — I480 Paroxysmal atrial fibrillation: Secondary | ICD-10-CM | POA: Diagnosis not present

## 2020-09-16 NOTE — Telephone Encounter (Signed)
Copied from [place] [phone] [phone]. Topic: General - Other >> Sep 16, 2020 12:02 PM Yvette Rack wrote: Reason for CRM: Colletta Maryland with [facility] called for Ester and to provide her direct fax# [phone]

## 2020-09-16 NOTE — Telephone Encounter (Unsigned)
Copied from [employer] [id number]. Topic: General - Other >> Sep 16, 2020  9:49 AM Tessa Lerner A wrote: Reason for CRM: Colletta Maryland with [facility] has called for an update on Plan of Care paperwork for 08/28/20-10/26/20  The paperwork was previously submitted via fax to the practice on 6/22 and 7/6  Please contact further

## 2020-09-17 NOTE — Telephone Encounter (Signed)
Faxed to new fax number given.  KP

## 2020-09-21 ENCOUNTER — Ambulatory Visit: Payer: Medicare PPO | Admitting: Pulmonary Disease

## 2020-09-21 ENCOUNTER — Other Ambulatory Visit: Payer: Self-pay

## 2020-09-21 ENCOUNTER — Encounter: Payer: Self-pay | Admitting: Pulmonary Disease

## 2020-09-21 VITALS — BP 148/72 | HR 69 | Temp 97.8°F | Ht 63.0 in | Wt 119.6 lb

## 2020-09-21 DIAGNOSIS — I480 Paroxysmal atrial fibrillation: Secondary | ICD-10-CM

## 2020-09-21 DIAGNOSIS — R0600 Dyspnea, unspecified: Secondary | ICD-10-CM

## 2020-09-21 DIAGNOSIS — I272 Pulmonary hypertension, unspecified: Secondary | ICD-10-CM

## 2020-09-21 DIAGNOSIS — I5189 Other ill-defined heart diseases: Secondary | ICD-10-CM | POA: Diagnosis not present

## 2020-09-21 DIAGNOSIS — J479 Bronchiectasis, uncomplicated: Secondary | ICD-10-CM | POA: Diagnosis not present

## 2020-09-21 NOTE — Progress Notes (Signed)
Subjective:    Patient ID: [name], female    DOB: [birth year], 87 y.o.   MRN: [id number] Chief Complaint  Patient presents with   pulmonary consult    Echo 06/10/2020. No current sx.    HPI [name] is an 87 year old lifelong never smoker who presents for evaluation of pulmonary hypertension.  She is kindly referred by [name], her primary care physician is Dr. Halina Murphy.  The patient presents today with no significant complaint.  She was noted to have moderate pulmonary hypertension by echocardiogram obtained in April.  This echocardiogram also showed that patient has grade II diastolic dysfunction and mild mitral stenosis.  The patient has also been noted to have paroxysmal atrial fibrillation not on anticoagulation due to intolerance of anticoagulants.  The patient states that she was actually doing well until April 1 when she sustained a left hip fracture after a mechanical fall.  She underwent pinning of the left hip and then subsequently was sent to an SNF for rehab.  On the 20th of April she was readmitted with encephalopathy, atrial fibrillation with RVR and metabolic derangements.  These issues were corrected and her encephalopathy cleared.  Chest CT at that time showed chronic inflammatory changes and no PE.  2D echo as noted above showed that she had grade II diastolic dysfunction and mild mitral stenosis.  Since her anticoagulants have been discontinued due to issues with nausea associated with the same.  This happened with both Eliquis and she follows with cardiology with regards to her paroxysmal atrial fibrillation and diastolic dysfunction.  Moderate pulmonary hypertension however she is totally asymptomatic in this regard.  She has no sleep disturbance.  She does have frequent awakenings due to nocturia.  She states that this is not a new issue for her.  She has not had any chest pain, no orthopnea.  Has had few episodes of paroxysmal nocturnal dyspnea but none  recent.  She and her spouse sleep in separate rooms due to his snoring.  However she does not recall anyone ever telling her she snores and she does not have daytime somnolence.  She feels that she is rested in the morning when she arises.  She has frequent issues with heartburn and indigestion, she does have a hiatal hernia.  She does not endorse any other symptomatology and overall is unsure why she is seeing [place] today.  She does not endorse any fevers, chills or sweats.  No weight loss or anorexia.  No cough or sputum production.  She has previously been evaluated and followed from the pulmonary standpoint by [name].  DATA: 02/20/2020 PFTs [name]): FEV1 was 1.13 L or 73% predicted, FVC was 1.96 L or 90% predicted, FEV1/FVC was 58%, lung volumes were normal, diffusion capacity corrected for alveolar volume was 84%.  Flow volume loop was consistent with obstruction.  Consistent with obstructive defect.  Ambulatory oximetry performed same day showed no significant oxygen desaturations, oxygen saturations at rest on room air was 97% on exertion nadir was 95%. 06/10/2020 2D echo: LVEF 65 to 70% no wall motion abnormalities.  Grade II DD.  Right ventricular systolic function normal right ventricular size normal.  Moderately elevated pulmonary artery systolic pressure.  Mild mitral stenosis. 08/16/2020 CT angio chest: No PE.  Bilateral streaky densities with interstitial nodularity suggestive of atypical infection.  Emphysematous changes described likely senescent changes.  Bronchiectasis, small hiatal hernia.   Review of Systems A 10 point review of systems was performed  and it is as noted above otherwise negative.  Past Medical History:  Diagnosis Date   Acute metabolic encephalopathy 20/94/7096   Allergy    Bronchiectasis ([place])    Cancer (HCC)    left breast cancer    COPD (chronic obstructive pulmonary disease) (HCC)    GERD (gastroesophageal reflux disease)    Hyperlipemia     Hypertension    Intractable nausea and vomiting 06/20/2018   Past Surgical History:  Procedure Laterality Date   BREAST LUMPECTOMY Left 2008   CHOLECYSTECTOMY     HIP PINNING,CANNULATED Left 06/05/2020   Procedure: CANNULATED HIP PINNING;  Surgeon: Olivia Knows, MD;  Location: [facility] ORS;  Service: Orthopedics;  Laterality: Left;   PARTIAL HYSTERECTOMY     TEE WITHOUT CARDIOVERSION N/A 06/10/2020   Procedure: TRANSESOPHAGEAL ECHOCARDIOGRAM (TEE);  Surgeon: Olivia Sable, MD;  Location: [facility] ORS;  Service: Cardiovascular;  Laterality: N/A;   Family History  Problem Relation Age of Onset   Breast cancer Sister    Social History   Tobacco Use   Smoking status: Never   Smokeless tobacco: Never  Substance Use Topics   Alcohol use: No   Allergies  Allergen Reactions   Eliquis [Apixaban] Nausea Only   Current Meds  Medication Sig   atorvastatin (LIPITOR) 20 MG tablet Take 1 tablet (20 mg total) by mouth daily.   cetirizine (ZYRTEC) 10 MG tablet Take 1 tablet (10 mg total) by mouth daily.   fluticasone (FLONASE) 50 MCG/ACT nasal spray SHAKE LIQUID AND USE 2 SPRAYS IN EACH NOSTRIL DAILY AS NEEDED FOR ALLERGIES OR RHINITIS   hydrochlorothiazide (MICROZIDE) 12.5 MG capsule Take 12.5 mg by mouth daily.   metoCLOPramide (REGLAN) 10 MG tablet Take 1 tablet (10 mg total) by mouth every 6 (six) hours as needed.   metoprolol tartrate (LOPRESSOR) 25 MG tablet Take 0.5 tablets (12.5 mg total) by mouth 2 (two) times daily.   Immunization History  Administered Date(s) Administered   Influenza Inj Mdck Quad Pf 12/15/2017   Influenza,inj,Quad PF,6+ Mos 11/25/2014, 11/21/2019   Influenza-Unspecified 12/06/2015, 11/25/2016, 11/23/2018   PFIZER(Purple Top)SARS-COV-2 Vaccination 05/06/2019, 05/26/2019, 12/04/2019   Pneumococcal Conjugate-13 11/22/2013   Pneumococcal Polysaccharide-23 03/08/2000   Tdap 07/06/2007   Zoster Recombinat (Shingrix) 01/06/2017, 05/17/2017   Zoster, Live 03/08/2005        Objective:   Physical Exam BP (!) 148/72 (BP Location: Left Arm, Cuff Size: Normal)   Pulse 69   Temp 97.8 F (36.6 C) (Temporal)   Ht 5\' 3"  (1.6 m)   Wt 119 lb 9.6 oz (54.3 kg)   SpO2 97%   BMI 21.19 kg/m  GENERAL: Frail-appearing elderly woman, ambulates with assistance of a cane.  No conversational dyspnea.  Awake, alert.  Fully oriented. HEAD: Normocephalic, atraumatic.  EYES: Pupils equal, round, reactive to light.  No scleral icterus.  MOUTH: Nose/mouth/throat not examined due to masking requirements for COVID 19. NECK: Supple. No thyromegaly. Trachea midline. No JVD.  No adenopathy. PULMONARY: Good air entry bilaterally.  No adventitious sounds. CARDIOVASCULAR: S1 and S2. Regular rate and rhythm.  Normal ABDOMEN: Benign. MUSCULOSKELETAL: No joint deformity, no clubbing, no edema.  NEUROLOGIC: No overt focal deficit, awake, alert fully oriented. SKIN: Intact,warm,dry. PSYCH: Mood and behavior normal.     Assessment & Plan:     ICD-10-CM   1. Bronchiectasis without complication (HCC)  G83.6 Pulse oximetry, overnight    Pulmonary Function Test [facility] Only   Chronic changes Obstructive defect likely related to this Possible underlying chronic infection such  as MAI Recheck pulmonary function testing    2. Pulmonary hypertension (HCC)  I27.20    Suspect related to diastolic dysfunction and mitral disease Cannot exclude nocturnal hypoxemia as trigger/contributing factor    3. Paroxysmal nocturnal dyspnea  R06.00    Overnight oximetry    4. Grade II diastolic dysfunction  N62.95    This issue adds complexity to her management Suspect pulmonary hypertension related mostly to this    5. AF (paroxysmal atrial fibrillation) (HCC)  I48.0    This issue adds complexity to her management In normal sinus rhythm today     Orders Placed This Encounter  Procedures   Pulse oximetry, overnight    On roomair.  DME:[facility]    Standing Status:   Future    Standing  Expiration Date:   09/21/2021   Pulmonary Function Test [facility] Only    Standing Status:   Future    Standing Expiration Date:   09/21/2021    Scheduling Instructions:     10/2020    Order Specific Question:   Full PFT: includes the following: basic spirometry, spirometry pre & post bronchodilator, diffusion capacity (DLCO), lung volumes    Answer:   Full PFT   Discussion:  Patient has pulmonary hypertension noted on 2D echo of April 2022.  She does have evidence of diastolic dysfunction and also mitral disease suspect that her pulmonary hypertension may be related to this.  Cannot exclude nocturnal hypoxemia with chronic hypoxic vasoconstriction as cause/contributing factor.  Will obtain overnight oximetry to evaluate this further.  We will obtain pulmonary function testing to reassess her pulmonary function particularly with regards to potential reversible airways obstruction.  In follow-up in 2 months time she is to contact us prior to that time should any new difficulties arise.   Renold Don, MD [facility]   *This note was dictated using voice recognition software/Dragon.  Despite best efforts to proofread, errors can occur which can change the meaning.  Any change was purely unintentional.

## 2020-09-21 NOTE — Patient Instructions (Signed)
We are going to get an overnight oxygen test  We will schedule breathing test that will be done before you come back to see [place]  Seen in follow-up in 2 months time call sooner should any new problems arise   We will call you with the results of the tests as they become available

## 2020-09-22 DIAGNOSIS — K5900 Constipation, unspecified: Secondary | ICD-10-CM | POA: Diagnosis not present

## 2020-09-22 DIAGNOSIS — J479 Bronchiectasis, uncomplicated: Secondary | ICD-10-CM | POA: Diagnosis not present

## 2020-09-22 DIAGNOSIS — K219 Gastro-esophageal reflux disease without esophagitis: Secondary | ICD-10-CM | POA: Diagnosis not present

## 2020-09-22 DIAGNOSIS — S72012D Unspecified intracapsular fracture of left femur, subsequent encounter for closed fracture with routine healing: Secondary | ICD-10-CM | POA: Diagnosis not present

## 2020-09-22 DIAGNOSIS — E785 Hyperlipidemia, unspecified: Secondary | ICD-10-CM | POA: Diagnosis not present

## 2020-09-22 DIAGNOSIS — I480 Paroxysmal atrial fibrillation: Secondary | ICD-10-CM | POA: Diagnosis not present

## 2020-09-22 DIAGNOSIS — I10 Essential (primary) hypertension: Secondary | ICD-10-CM | POA: Diagnosis not present

## 2020-09-22 DIAGNOSIS — E876 Hypokalemia: Secondary | ICD-10-CM | POA: Diagnosis not present

## 2020-09-27 DIAGNOSIS — I480 Paroxysmal atrial fibrillation: Secondary | ICD-10-CM | POA: Diagnosis not present

## 2020-09-27 DIAGNOSIS — I10 Essential (primary) hypertension: Secondary | ICD-10-CM | POA: Diagnosis not present

## 2020-09-27 DIAGNOSIS — E785 Hyperlipidemia, unspecified: Secondary | ICD-10-CM | POA: Diagnosis not present

## 2020-09-27 DIAGNOSIS — K5900 Constipation, unspecified: Secondary | ICD-10-CM | POA: Diagnosis not present

## 2020-09-27 DIAGNOSIS — S72012D Unspecified intracapsular fracture of left femur, subsequent encounter for closed fracture with routine healing: Secondary | ICD-10-CM | POA: Diagnosis not present

## 2020-09-27 DIAGNOSIS — J479 Bronchiectasis, uncomplicated: Secondary | ICD-10-CM | POA: Diagnosis not present

## 2020-09-27 DIAGNOSIS — E876 Hypokalemia: Secondary | ICD-10-CM | POA: Diagnosis not present

## 2020-09-27 DIAGNOSIS — K219 Gastro-esophageal reflux disease without esophagitis: Secondary | ICD-10-CM | POA: Diagnosis not present

## 2020-09-29 DIAGNOSIS — I10 Essential (primary) hypertension: Secondary | ICD-10-CM | POA: Diagnosis not present

## 2020-09-29 DIAGNOSIS — J479 Bronchiectasis, uncomplicated: Secondary | ICD-10-CM | POA: Diagnosis not present

## 2020-09-29 DIAGNOSIS — K5900 Constipation, unspecified: Secondary | ICD-10-CM | POA: Diagnosis not present

## 2020-09-29 DIAGNOSIS — E785 Hyperlipidemia, unspecified: Secondary | ICD-10-CM | POA: Diagnosis not present

## 2020-09-29 DIAGNOSIS — S72012D Unspecified intracapsular fracture of left femur, subsequent encounter for closed fracture with routine healing: Secondary | ICD-10-CM | POA: Diagnosis not present

## 2020-09-29 DIAGNOSIS — K219 Gastro-esophageal reflux disease without esophagitis: Secondary | ICD-10-CM | POA: Diagnosis not present

## 2020-09-29 DIAGNOSIS — I480 Paroxysmal atrial fibrillation: Secondary | ICD-10-CM | POA: Diagnosis not present

## 2020-09-29 DIAGNOSIS — E876 Hypokalemia: Secondary | ICD-10-CM | POA: Diagnosis not present

## 2020-09-30 ENCOUNTER — Other Ambulatory Visit: Payer: Self-pay

## 2020-09-30 ENCOUNTER — Encounter: Payer: Self-pay | Admitting: Gastroenterology

## 2020-09-30 ENCOUNTER — Ambulatory Visit: Payer: Medicare PPO | Admitting: Gastroenterology

## 2020-09-30 ENCOUNTER — Telehealth: Payer: Self-pay

## 2020-09-30 VITALS — BP 156/66 | HR 66 | Temp 97.8°F | Ht 63.0 in | Wt 120.0 lb

## 2020-09-30 DIAGNOSIS — R110 Nausea: Secondary | ICD-10-CM

## 2020-09-30 DIAGNOSIS — R109 Unspecified abdominal pain: Secondary | ICD-10-CM | POA: Diagnosis not present

## 2020-09-30 NOTE — Telephone Encounter (Signed)
Pt was not home yet, [place] appt info given to spouse [place] August 17 11am, arrive at 10:30, NPO 6 hrs prior, spouse expressed understanding

## 2020-09-30 NOTE — Patient Instructions (Signed)
Creon instructions to take 1 capsule 3 times daily with meals  I will call you with the date and time of ultrasound

## 2020-09-30 NOTE — Progress Notes (Signed)
Adrea Antigua, MD [address]  [place]  [place], [place] [address]  Main: [phone]  Fax: [phone]   Primary Care Physician: Glean Hess, MD  CC: Nausea  HPI: [name] is a 87 y.o. female here for follow up of nausea.  Patient symptoms were being attributed to her medications as she repeatedly stated that the symptoms started after start of Eliquis.  Patient was seen by her cardiologist and Eliquis was changed to [place].  However, symptoms did not improve.  Xarelto has since been discontinued as well, and patient continues to have daily nausea.  This is present with or without meals.  Denies any abdominal pain.  No vomiting.  States she makes herself eat and therefore has not been losing weight.  Her daughter is with her today who helps provide history and states patient has continued to have nausea which is affecting her quality of life.  No dysphagia.  Patient was prescribed Reglan by another provider and states this is not helping.  Is also on PPI and H2 RA therapy.  States prior to this was having heartburn issues and these have resolved.  Upper GI study with small bowel follow-through was normal.  Right upper quadrant ultrasound shows evidence of cholecystectomy, with no biliary distention noted.  Fatty liver was seen.  Previous history: Patient reports ongoing nausea since being in the hospital in April 2022 for left hip fracture and symptoms have not improved since then.  No vomiting.  Reports decrease in appetite due to her symptoms as well.  Denies any abdominal pain or postprandial pain.  No altered bowel habits.  Reports 1 soft bowel movement daily.  No blood in stool.  Reports previous colonoscopies that have been normal with last 1 being over 10 years ago.  Reports not available.  Denies any previous upper endoscopy.  No dysphagia.  The only new medication that she was recently started on has been Eliquis for A. Fib.  Recent imaging for her  symptoms have included CT scan with contrast in April 2022 which did not report any acute abnormalities in the abdomen or pelvis.  Mild intra and extrahepatic biliary duct dilatation was seen was that was attribute it to history of cholecystectomy.  Small hiatal hernia was also seen.  Patient has been on Pepcid chronically as she reports history of GERD.  She has not been given any PPIs recently.  Denies any NSAID use.  Liver enzymes have been normal.  Current Outpatient Medications  Medication Sig Dispense Refill   atorvastatin (LIPITOR) 20 MG tablet Take 1 tablet (20 mg total) by mouth daily.     cetirizine (ZYRTEC) 10 MG tablet Take 1 tablet (10 mg total) by mouth daily.     fluticasone (FLONASE) 50 MCG/ACT nasal spray SHAKE LIQUID AND USE 2 SPRAYS IN EACH NOSTRIL DAILY AS NEEDED FOR ALLERGIES OR RHINITIS 48 g 3   hydrochlorothiazide (MICROZIDE) 12.5 MG capsule Take 12.5 mg by mouth daily.     metoCLOPramide (REGLAN) 10 MG tablet Take 1 tablet (10 mg total) by mouth every 6 (six) hours as needed. 30 tablet 0   metoprolol tartrate (LOPRESSOR) 25 MG tablet Take 0.5 tablets (12.5 mg total) by mouth 2 (two) times daily. 90 tablet 3   Rivaroxaban (XARELTO) 15 MG TABS tablet Take 1 tablet (15 mg total) by mouth daily with supper. (Patient not taking: Reported on 09/21/2020) 14 tablet 0   No current facility-administered medications for this visit.  Allergies as of 09/30/2020 - Review Complete 09/21/2020  Allergen Reaction Noted   Eliquis [apixaban] Nausea Only 09/14/2020    ROS:  General: Negative for anorexia, weight loss, fever, chills, fatigue, weakness. ENT: Negative for hoarseness, difficulty swallowing , nasal congestion. CV: Negative for chest pain, angina, palpitations, dyspnea on exertion, peripheral edema.  Respiratory: Negative for dyspnea at rest, dyspnea on exertion, cough, sputum, wheezing.  GI: See history of present illness. GU:  Negative for dysuria, hematuria, urinary  incontinence, urinary frequency, nocturnal urination.  Endo: Negative for unusual weight change.    Physical Examination:  Vitals:   09/30/20 1312  BP: (!) 156/66  Pulse: 66  Temp: 97.8 F (36.6 C)  TempSrc: Oral  Weight: 120 lb (54.4 kg)  Height: '5\' 3"'$  (1.6 m)     General: Well-nourished, well-developed in no acute distress.  Eyes: No icterus. Conjunctivae pink. Mouth: Oropharyngeal mucosa moist and pink , no lesions erythema or exudate. Neck: Supple, Trachea midline Abdomen: Bowel sounds are normal, nontender, nondistended, no hepatosplenomegaly or masses, no abdominal bruits or hernia , no rebound or guarding.   Extremities: No lower extremity edema. No clubbing or deformities. Neuro: Alert and oriented x 3.  Grossly intact. Skin: Warm and dry, no jaundice.   Psych: Alert and cooperative, normal mood and affect.   Labs: CMP     Component Value Date/Time   NA 140 08/16/2020 1547   NA 142 01/09/2020 1026   NA 140 10/07/2013 1143   K 3.5 08/16/2020 1547   K 4.2 10/07/2013 1143   CL 105 08/16/2020 1547   CL 106 10/07/2013 1143   CO2 28 08/16/2020 1547   CO2 26 10/07/2013 1143   GLUCOSE 116 (H) 08/16/2020 1547   GLUCOSE 117 (H) 10/07/2013 1143   BUN 22 08/16/2020 1547   BUN 24 01/09/2020 1026   BUN 19 (H) 10/07/2013 1143   CREATININE 1.04 (H) 08/16/2020 1547   CREATININE 0.69 10/07/2013 1143   CALCIUM 9.1 08/16/2020 1547   CALCIUM 8.9 10/07/2013 1143   PROT 7.5 07/09/2020 1005   PROT 6.9 01/09/2020 1026   PROT 8.6 (H) 01/04/2013 1042   ALBUMIN 3.9 07/09/2020 1005   ALBUMIN 4.4 01/09/2020 1026   ALBUMIN 3.0 (L) 01/04/2013 1042   AST 24 07/09/2020 1005   AST 67 (H) 01/04/2013 1042   ALT 17 07/09/2020 1005   ALT 62 01/04/2013 1042   ALKPHOS 98 07/09/2020 1005   ALKPHOS 136 01/04/2013 1042   BILITOT 0.7 07/09/2020 1005   BILITOT 0.5 01/09/2020 1026   BILITOT 0.4 01/04/2013 1042   GFRNONAA 53 (L) 08/16/2020 1547   GFRNONAA >60 10/07/2013 1143   GFRAA 78  01/09/2020 1026   GFRAA >60 10/07/2013 1143   Lab Results  Component Value Date   WBC 12.3 (H) 08/16/2020   HGB 13.9 08/16/2020   HCT 41.3 08/16/2020   MCV 89.4 08/16/2020   PLT 232 08/16/2020    Imaging Studies: US ABDOMEN LIMITED RUQ (LIVER/GB)  Result Date: 09/03/2020 CLINICAL DATA:  Right upper quadrant pain. EXAM: ULTRASOUND ABDOMEN LIMITED RIGHT UPPER QUADRANT COMPARISON:  CT 07/04/2020. FINDINGS: Gallbladder: Cholecystectomy Common bile duct: Diameter: 4.7 mm. No biliary distention noted as noted on prior CT of 07/04/2020. Liver: Heterogeneous hepatic parenchymal pattern most consistent with fatty infiltration. An area of focal fatty sparing may be present. No focal hepatic abnormality noted on recent CT. Portal vein is patent on color Doppler imaging with normal direction of blood flow towards the liver. Other: Incidental note is  made of a simple right renal cyst. IMPRESSION: 1. Cholecystectomy. No biliary distention noted on today's ultrasound as noted on prior CT of 07/04/2020. 2. Heterogeneous parenchymal pattern most consistent with fatty infiltration. Area of focal fatty sparing may be present. No focal hepatic abnormality noted on recent CT. Electronically Signed   By: Marcello Moores  Register   On: 09/03/2020 07:49    Assessment and Plan:   [name] is a 87 y.o. y/o female here for follow-up of nausea  Since Reglan is not helping her, okay to discontinue.  Blackbox warning associated with Reglan discussed.  We have never prescribed her Reglan before, but patient has it from another provider at home.  Symptoms may be due to very recent hospitalizations and new diagnoses at an elderly age  I will start Creon, and samples given for trial to see if it helps with her nausea since nothing else has helped and it is affecting her daily quality of life.  Fecal pancreatic elastase test also ordered at this time  Check ultrasound mesenteric arteries to rule out any issues causing her  symptoms as well  Cardiology note reviewed and they state since patient symptoms did not improve with changes in her medications, her symptoms may not be related to the medications itself  Dr Avanell Antigua

## 2020-10-01 ENCOUNTER — Other Ambulatory Visit: Payer: Self-pay | Admitting: Gastroenterology

## 2020-10-01 DIAGNOSIS — R109 Unspecified abdominal pain: Secondary | ICD-10-CM | POA: Diagnosis not present

## 2020-10-01 DIAGNOSIS — R110 Nausea: Secondary | ICD-10-CM | POA: Diagnosis not present

## 2020-10-06 ENCOUNTER — Other Ambulatory Visit: Payer: Self-pay | Admitting: Gastroenterology

## 2020-10-06 DIAGNOSIS — K5900 Constipation, unspecified: Secondary | ICD-10-CM | POA: Diagnosis not present

## 2020-10-06 DIAGNOSIS — S72012D Unspecified intracapsular fracture of left femur, subsequent encounter for closed fracture with routine healing: Secondary | ICD-10-CM | POA: Diagnosis not present

## 2020-10-06 DIAGNOSIS — I10 Essential (primary) hypertension: Secondary | ICD-10-CM | POA: Diagnosis not present

## 2020-10-06 DIAGNOSIS — E876 Hypokalemia: Secondary | ICD-10-CM | POA: Diagnosis not present

## 2020-10-06 DIAGNOSIS — E785 Hyperlipidemia, unspecified: Secondary | ICD-10-CM | POA: Diagnosis not present

## 2020-10-06 DIAGNOSIS — J479 Bronchiectasis, uncomplicated: Secondary | ICD-10-CM | POA: Diagnosis not present

## 2020-10-06 DIAGNOSIS — I480 Paroxysmal atrial fibrillation: Secondary | ICD-10-CM | POA: Diagnosis not present

## 2020-10-06 DIAGNOSIS — K219 Gastro-esophageal reflux disease without esophagitis: Secondary | ICD-10-CM | POA: Diagnosis not present

## 2020-10-07 LAB — PANCREATIC ELASTASE, FECAL: Pancreatic Elastase, Fecal: >500 ug Elast./g (ref >200)

## 2020-10-08 DIAGNOSIS — G4730 Sleep apnea, unspecified: Secondary | ICD-10-CM | POA: Diagnosis not present

## 2020-10-08 DIAGNOSIS — R0683 Snoring: Secondary | ICD-10-CM | POA: Diagnosis not present

## 2020-10-09 ENCOUNTER — Ambulatory Visit: Payer: Self-pay | Admitting: *Deleted

## 2020-10-09 ENCOUNTER — Other Ambulatory Visit: Payer: Self-pay

## 2020-10-09 ENCOUNTER — Ambulatory Visit
Admission: EM | Admit: 2020-10-09 | Discharge: 2020-10-09 | Disposition: A | Discharge status: Home / Self Care | Payer: Medicare PPO | Attending: Sports Medicine | Admitting: Sports Medicine

## 2020-10-09 DIAGNOSIS — R110 Nausea: Secondary | ICD-10-CM | POA: Diagnosis not present

## 2020-10-09 DIAGNOSIS — B379 Candidiasis, unspecified: Secondary | ICD-10-CM | POA: Insufficient documentation

## 2020-10-09 DIAGNOSIS — N3001 Acute cystitis with hematuria: Secondary | ICD-10-CM | POA: Insufficient documentation

## 2020-10-09 LAB — URINALYSIS, COMPLETE (UACMP) WITH MICROSCOPIC
Bilirubin Urine: NEGATIVE
Glucose, UA: 100 mg/dL — AB
Ketones, ur: NEGATIVE mg/dL
Nitrite: POSITIVE — AB
Protein, ur: NEGATIVE mg/dL
Specific Gravity, Urine: 1.015 (ref 1.005–1.030)
WBC, UA: >50 WBC/hpf (ref 0–5)
pH: 7 (ref 5.0–8.0)

## 2020-10-09 MED ORDER — CEPHALEXIN 500 MG PO CAPS: 500.0000 mg | ORAL_CAPSULE | Freq: Two times a day (BID) | ORAL | 0 refills | Status: AC

## 2020-10-09 MED ORDER — FLUCONAZOLE 150 MG PO TABS: 150.0000 mg | ORAL_TABLET | Freq: Every day | ORAL | 0 refills | Status: AC

## 2020-10-09 MED ORDER — PROMETHAZINE HCL 12.5 MG PO TABS: 12.5000 mg | ORAL_TABLET | Freq: Three times a day (TID) | ORAL | 0 refills | Status: DC | PRN

## 2020-10-09 NOTE — ED Triage Notes (Signed)
Pt c/o dysuria and urinary frequency for several days. Pt denies hematuria, abd pain, f/n/v/d or other symptoms.

## 2020-10-09 NOTE — Discharge Instructions (Addendum)

## 2020-10-09 NOTE — Telephone Encounter (Signed)
I returned her call.   She had called in and left a message c/o burning with urination.  See triage notes.   There are no appts available this evening at [facility] with Dr. Army Melia so I gave them the information for [facility].   They are going to call and go there this afternoon.Reason for Disposition  Age > 50 years  Answer Assessment - Initial Assessment Questions 1. SEVERITY: "How bad is the pain?"  (e.g., Scale 1-10; mild, moderate, or severe)   - MILD (1-3): complains slightly about urination hurting   - MODERATE (4-7): interferes with normal activities     - SEVERE (8-10): excruciating, unwilling or unable to urinate because of the pain      I returned her call.   She is c/o urinary burning for a couple of days.   2. FREQUENCY: "How many times have you had painful urination today?"      Every time 3. PATTERN: "Is pain present every time you urinate or just sometimes?"      Every time. 4. ONSET: "When did the painful urination start?"      A couple of days  5. FEVER: "Do you have a fever?" If Yes, ask: "What is your temperature, how was it measured, and when did it start?"     No fever 6. PAST UTI: "Have you had a urine infection before?" If Yes, ask: "When was the last time?" and "What happened that time?"      Yes in the hospital  in APril 7. CAUSE: "What do you think is causing the painful urination?"  (e.g., UTI, scratch, Herpes sore)     UTI 8. OTHER SYMPTOMS: "Do you have any other symptoms?" (e.g., flank pain, vaginal discharge, genital sores, urgency, blood in urine)     Itching 9. PREGNANCY: "Is there any chance you are pregnant?" "When was your last menstrual period?"     N/A  Protocols used: Urination Pain - Female-A-AH

## 2020-10-09 NOTE — ED Provider Notes (Signed)
[facility]    CSN: [id number] Arrival date & time: 10/09/20  1456      History   Chief Complaint Chief Complaint  Patient presents with   Dysuria   Urinary Frequency    HPI [name] is a 87 y.o. female presenting with 3 to 4-day history of dysuria, urinary frequency and urgency.  She denies any fever, fatigue, lower abdominal pain or back pain.  Has not noticed any hematuria and denies any vaginal discharge, itching or odor.  Patient says she believes she may have a UTI.  She does report that she had a really bad UTI that led her to sepsis about 4 months ago.  Past medical history is known for hypertension, hyperlipidemia, GERD, COPD, bronchiectasis and chronic nausea.  Patient states that she has taken Zofran and Phenergan in the past but is currently out of these medications.  She says that she has had nausea consistently over the past 4 months following a hip fracture.  Patient says she would like a refill of 1 of these medications.  She has no other complaints or concerns.  HPI  Past Medical History:  Diagnosis Date   Acute metabolic encephalopathy [id number]   Allergy    Bronchiectasis ([place])    Cancer (HCC)    left breast cancer    COPD (chronic obstructive pulmonary disease) (HCC)    GERD (gastroesophageal reflux disease)    Hyperlipemia    Hypertension    Intractable nausea and vomiting 06/20/2018    Patient Active Problem List   Diagnosis Date Noted   Closed intracapsular fracture of left femur with routine healing 08/28/2020   Acquired thrombophilia ([place]) 07/15/2020   AF (paroxysmal atrial fibrillation) ([place]) 06/19/2020   Atrial fibrillation with rapid ventricular response ([place]) 06/09/2020   Fracture of femoral neck, left, closed ([place]) 06/05/2020   Atherosclerosis of aorta ([place]) 01/09/2020   Stage 2 moderate COPD by GOLD classification ([place]) 01/09/2020   HSV (herpes simplex virus) infection 01/09/2020   BCC (basal cell carcinoma), face  10/12/2017   Environmental and seasonal allergies 10/30/2014   Psoriasis vulgaris 10/30/2014   Essential (primary) hypertension 10/30/2014   Gastro-esophageal reflux disease without esophagitis 10/30/2014   Hot flash, menopausal 10/30/2014   Hyperlipidemia, mixed 10/30/2014   History of cancer of left breast 10/30/2014   Bronchiectasis ([place]) 09/24/2013    Past Surgical History:  Procedure Laterality Date   BREAST LUMPECTOMY Left 2008   CHOLECYSTECTOMY     HIP PINNING,CANNULATED Left 06/05/2020   Procedure: CANNULATED HIP PINNING;  Surgeon: Hessie Knows, MD;  Location: [facility] ORS;  Service: Orthopedics;  Laterality: Left;   PARTIAL HYSTERECTOMY     TEE WITHOUT CARDIOVERSION N/A 06/10/2020   Procedure: TRANSESOPHAGEAL ECHOCARDIOGRAM (TEE);  Surgeon: Kate Sable, MD;  Location: [facility] ORS;  Service: Cardiovascular;  Laterality: N/A;    OB History   No obstetric history on file.      Home Medications    Prior to Admission medications   Medication Sig Start Date End Date Taking? Authorizing Provider  atorvastatin (LIPITOR) 20 MG tablet Take 1 tablet (20 mg total) by mouth daily. 06/12/20  Yes Sreenath, Sudheer B, MD  cephALEXin (KEFLEX) 500 MG capsule Take 1 capsule (500 mg total) by mouth 2 (two) times daily for 7 days. 10/09/20 10/16/20 Yes Danton Clap, PA-C  cetirizine (ZYRTEC) 10 MG tablet Take 1 tablet (10 mg total) by mouth daily. 06/11/20  Yes Sreenath, Sudheer B, MD  fluconazole (DIFLUCAN) 150 MG tablet Take  1 tablet (150 mg total) by mouth daily for 1 day. 10/09/20 10/10/20 Yes Laurene Footman B, PA-C  fluticasone (FLONASE) 50 MCG/ACT nasal spray SHAKE LIQUID AND USE 2 SPRAYS IN EACH NOSTRIL DAILY AS NEEDED FOR ALLERGIES OR RHINITIS 06/05/20  Yes Glean Hess, MD  hydrochlorothiazide (MICROZIDE) 12.5 MG capsule Take 12.5 mg by mouth daily.   Yes [provider]  metoCLOPramide (REGLAN) 10 MG tablet Take 1 tablet (10 mg total) by mouth every 6 (six) hours as needed.  08/16/20 11/14/20 Yes Carrie Mew, MD  metoprolol tartrate (LOPRESSOR) 25 MG tablet Take 0.5 tablets (12.5 mg total) by mouth 2 (two) times daily. 07/16/20 07/11/21 Yes Furth, Cadence H, PA-C  promethazine (PHENERGAN) 12.5 MG tablet Take 1 tablet (12.5 mg total) by mouth every 8 (eight) hours as needed for up to 5 days for nausea or vomiting. 10/09/20 10/14/20 Yes Danton Clap, PA-C  Rivaroxaban (XARELTO) 15 MG TABS tablet Take 1 tablet (15 mg total) by mouth daily with supper. 09/14/20  Yes Marrianne Mood D, PA-C    Family History Family History  Problem Relation Age of Onset   Breast cancer Sister     Social History Social History   Tobacco Use   Smoking status: Never   Smokeless tobacco: Never  Vaping Use   Vaping Use: Never used  Substance Use Topics   Alcohol use: No   Drug use: No     Allergies   Eliquis [apixaban]   Review of Systems Review of Systems  Constitutional:  Negative for chills and fever.  Gastrointestinal:  Positive for nausea. Negative for abdominal pain, diarrhea and vomiting.  Genitourinary:  Positive for dysuria, frequency and urgency. Negative for decreased urine volume, flank pain, hematuria, pelvic pain, vaginal bleeding, vaginal discharge and vaginal pain.  Musculoskeletal:  Negative for back pain.  Skin:  Negative for rash.    Physical Exam Triage Vital Signs ED Triage Vitals  Enc Vitals Group     BP 10/09/20 1510 (!) 156/68     Pulse Rate 10/09/20 1510 64     Resp 10/09/20 1510 18     Temp 10/09/20 1510 98.3 F (36.8 C)     Temp Source 10/09/20 1510 Oral     SpO2 10/09/20 1510 93 %     Weight 10/09/20 1508 119 lb (54 kg)     Height 10/09/20 1508 '5\' 3"'$  (1.6 m)     Head Circumference --      Peak Flow --      Pain Score 10/09/20 1508 5     Pain Loc --      Pain Edu? --      Excl. in [place]? --    No data found.  Updated Vital Signs BP (!) 156/68 (BP Location: Left Arm)   Pulse 64   Temp 98.3 F (36.8 C) (Oral)   Resp 18   Ht  '5\' 3"'$  (1.6 m)   Wt 119 lb (54 kg)   SpO2 93%   BMI 21.08 kg/m      Physical Exam Vitals and nursing note reviewed.  Constitutional:      General: She is not in acute distress.    Appearance: Normal appearance. She is not ill-appearing or toxic-appearing.  HENT:     Head: Normocephalic and atraumatic.  Eyes:     General: No scleral icterus.       Right eye: No discharge.        Left eye: No discharge.  Conjunctiva/sclera: Conjunctivae normal.  Cardiovascular:     Rate and Rhythm: Normal rate and regular rhythm.     Heart sounds: Normal heart sounds.  Pulmonary:     Effort: Pulmonary effort is normal. No respiratory distress.     Breath sounds: Normal breath sounds.  Abdominal:     Palpations: Abdomen is soft.     Tenderness: There is no abdominal tenderness. There is no right CVA tenderness or left CVA tenderness.  Musculoskeletal:     Cervical back: Neck supple.  Skin:    General: Skin is dry.  Neurological:     General: No focal deficit present.     Mental Status: She is alert. Mental status is at baseline.     Motor: No weakness.     Gait: Gait normal.  Psychiatric:        Mood and Affect: Mood normal.        Behavior: Behavior normal.        Thought Content: Thought content normal.     UC Treatments / Results  Labs (all labs ordered are listed, but only abnormal results are displayed) Labs Reviewed  URINALYSIS, COMPLETE (UACMP) WITH MICROSCOPIC - Abnormal; Notable for the following components:      Result Value   Color, Urine ORANGE (*)    APPearance HAZY (*)    Glucose, UA 100 (*)    Hgb urine dipstick SMALL (*)    Nitrite POSITIVE (*)    Leukocytes,Ua SMALL (*)    Bacteria, UA MANY (*)    All other components within normal limits  URINE CULTURE    EKG   Radiology No results found.  Procedures Procedures (including critical care time)  Medications Ordered in UC Medications - No data to display  Initial Impression / Assessment and Plan /  UC Course  I have reviewed the triage vital signs and the nursing notes.  Pertinent labs & imaging results that were available during my care of the patient were reviewed by me and considered in my medical decision making (see chart for details).  87 year old female presenting for dysuria, urinary frequency and urgency over the past few days.  Also notes chronic nausea.  Vitals are stable.  Blood pressure is little elevated at 156/68.  She is afebrile.  UA is positive for small blood, positive nitrates, small leukocytes and many bacteria.  We will send urine for culture and treat for UTI.  She does report that she has taken AZO.  Treating patient at this time with Keflex.  Also refilled Phenergan since she said it worked better for her than the Zofran.  Advised her to be careful this medication as it can make her drowsy.  Any further refills should come from PCP.  Additionally she requested Diflucan in case she develops yeast infections that she is prone to getting those after taking antibiotics.  Diflucan sent.  ED precautions reviewed with patient.   Final Clinical Impressions(s) / UC Diagnoses   Final diagnoses:  Acute cystitis with hematuria  Nausea without vomiting  Yeast infection     Discharge Instructions      UTI: Based on either symptoms or urinalysis, you may have a urinary tract infection. We will send the urine for culture and call with results in a few days. Begin antibiotics at this time. Your symptoms should be much improved over the next 2-3 days. Increase rest and fluid intake. If for some reason symptoms are worsening or not improving after a couple of days  or the urine culture determines the antibiotics you are taking will not treat the infection, the antibiotics may be changed. Return or go to ER for fever, back pain, worsening urinary pain, discharge, increased blood in urine. May take Tylenol or Motrin OTC for pain relief or consider AZO if no contraindications       ED Prescriptions     Medication Sig Dispense Auth. Provider   cephALEXin (KEFLEX) 500 MG capsule Take 1 capsule (500 mg total) by mouth 2 (two) times daily for 7 days. 14 capsule Laurene Footman B, PA-C   promethazine (PHENERGAN) 12.5 MG tablet Take 1 tablet (12.5 mg total) by mouth every 8 (eight) hours as needed for up to 5 days for nausea or vomiting. 15 tablet Laurene Footman B, PA-C   fluconazole (DIFLUCAN) 150 MG tablet Take 1 tablet (150 mg total) by mouth daily for 1 day. 1 tablet Gretta Cool      PDMP not reviewed this encounter.   Danton Clap, PA-C 10/09/20 [phone]

## 2020-10-12 LAB — URINE CULTURE: Culture: >=100000 — AB

## 2020-10-13 DIAGNOSIS — I480 Paroxysmal atrial fibrillation: Secondary | ICD-10-CM | POA: Diagnosis not present

## 2020-10-13 DIAGNOSIS — E785 Hyperlipidemia, unspecified: Secondary | ICD-10-CM | POA: Diagnosis not present

## 2020-10-13 DIAGNOSIS — S72012D Unspecified intracapsular fracture of left femur, subsequent encounter for closed fracture with routine healing: Secondary | ICD-10-CM | POA: Diagnosis not present

## 2020-10-13 DIAGNOSIS — E876 Hypokalemia: Secondary | ICD-10-CM | POA: Diagnosis not present

## 2020-10-13 DIAGNOSIS — K5900 Constipation, unspecified: Secondary | ICD-10-CM | POA: Diagnosis not present

## 2020-10-13 DIAGNOSIS — J479 Bronchiectasis, uncomplicated: Secondary | ICD-10-CM | POA: Diagnosis not present

## 2020-10-13 DIAGNOSIS — I10 Essential (primary) hypertension: Secondary | ICD-10-CM | POA: Diagnosis not present

## 2020-10-13 DIAGNOSIS — K219 Gastro-esophageal reflux disease without esophagitis: Secondary | ICD-10-CM | POA: Diagnosis not present

## 2020-10-15 ENCOUNTER — Telehealth: Payer: Self-pay

## 2020-10-15 DIAGNOSIS — G4734 Idiopathic sleep related nonobstructive alveolar hypoventilation: Secondary | ICD-10-CM

## 2020-10-15 NOTE — Telephone Encounter (Signed)
Called and spoke with patient to let her know that her ONO showed that she needs 2 liters oxygen at night while she sleeps. Advised her I would put in an order and she would get a call from DME. Patient expressed understanding. Order placed. Nothing further needed at this time.

## 2020-10-16 DIAGNOSIS — J479 Bronchiectasis, uncomplicated: Secondary | ICD-10-CM | POA: Diagnosis not present

## 2020-10-19 ENCOUNTER — Telehealth: Payer: Self-pay

## 2020-10-19 DIAGNOSIS — E876 Hypokalemia: Secondary | ICD-10-CM | POA: Diagnosis not present

## 2020-10-19 DIAGNOSIS — K5900 Constipation, unspecified: Secondary | ICD-10-CM | POA: Diagnosis not present

## 2020-10-19 DIAGNOSIS — K219 Gastro-esophageal reflux disease without esophagitis: Secondary | ICD-10-CM | POA: Diagnosis not present

## 2020-10-19 DIAGNOSIS — E785 Hyperlipidemia, unspecified: Secondary | ICD-10-CM | POA: Diagnosis not present

## 2020-10-19 DIAGNOSIS — I480 Paroxysmal atrial fibrillation: Secondary | ICD-10-CM | POA: Diagnosis not present

## 2020-10-19 DIAGNOSIS — I10 Essential (primary) hypertension: Secondary | ICD-10-CM | POA: Diagnosis not present

## 2020-10-19 DIAGNOSIS — J479 Bronchiectasis, uncomplicated: Secondary | ICD-10-CM | POA: Diagnosis not present

## 2020-10-19 DIAGNOSIS — S72012D Unspecified intracapsular fracture of left femur, subsequent encounter for closed fracture with routine healing: Secondary | ICD-10-CM | POA: Diagnosis not present

## 2020-10-19 NOTE — Telephone Encounter (Signed)
Copied from [place] [id number]. Topic: General - Other >> Oct 19, 2020 10:48 AM Leward Quan A wrote: Reason for CRM: Patient called in to get an appointment with Dr Army Melia say that she have stinging and pain in her vaginal area. Scheduled to be seen 10/20/20 nothing available today. Asking if Dr Lu Duffel be able to help her has burning itching and discomfort in her vaginal area. Please call Ph# [phone]

## 2020-10-20 ENCOUNTER — Other Ambulatory Visit: Payer: Self-pay

## 2020-10-20 ENCOUNTER — Encounter: Payer: Self-pay | Admitting: Internal Medicine

## 2020-10-20 ENCOUNTER — Ambulatory Visit: Payer: Medicare PPO | Admitting: Internal Medicine

## 2020-10-20 VITALS — BP 128/72 | HR 60 | Temp 97.7°F | Ht 63.0 in | Wt 120.0 lb

## 2020-10-20 DIAGNOSIS — B9689 Other specified bacterial agents as the cause of diseases classified elsewhere: Secondary | ICD-10-CM

## 2020-10-20 DIAGNOSIS — B9620 Unspecified Escherichia coli [E. coli] as the cause of diseases classified elsewhere: Secondary | ICD-10-CM | POA: Diagnosis not present

## 2020-10-20 DIAGNOSIS — N390 Urinary tract infection, site not specified: Secondary | ICD-10-CM

## 2020-10-20 DIAGNOSIS — S72012D Unspecified intracapsular fracture of left femur, subsequent encounter for closed fracture with routine healing: Secondary | ICD-10-CM

## 2020-10-20 DIAGNOSIS — N760 Acute vaginitis: Secondary | ICD-10-CM | POA: Diagnosis not present

## 2020-10-20 LAB — POC URINALYSIS WITH MICROSCOPIC (NON AUTO)MANUAL RESULT
Bacteria, UA: 0
Bilirubin, UA: NEGATIVE
Blood, UA: NEGATIVE
Crystals: 0
Epithelial cells, urine per micros: 2
Glucose, UA: NEGATIVE
Ketones, UA: NEGATIVE
Mucus, UA: 0
Nitrite, UA: NEGATIVE
Protein, UA: NEGATIVE
RBC: 0 M/uL — AB (ref 4.04–5.48)
Spec Grav, UA: 1.015
Urobilinogen, UA: 0.2 U/dL
WBC Casts, UA: 2
pH, UA: 6.5

## 2020-10-20 LAB — POCT WET PREP WITH KOH
KOH Prep POC: NEGATIVE
RBC Wet Prep HPF POC: 0
Trichomonas, UA: NEGATIVE
WBC Wet Prep HPF POC: 0
Yeast Wet Prep HPF POC: NEGATIVE

## 2020-10-20 MED ORDER — METRONIDAZOLE 0.75 % VA GEL: 1.0000 | Freq: Two times a day (BID) | VAGINAL | 1 refills | Status: AC

## 2020-10-20 NOTE — Progress Notes (Signed)
Date:  10/20/2020   Name:  [name]   DOB:  August 09, 1933   MRN:  [id number]   Chief Complaint: Vaginal Pain (X2 months, Stinging and itching, painful, doesn't hurt to use the bathroom, swollen, no discharge, redness) and Nausea (X4 months, gets up sick every morning, feels like she has to vomit but doesn't )  Vaginal Pain The patient's primary symptoms include genital itching and pelvic pain. This is a new problem. The problem has been gradually worsening. The pain is mild. The problem affects both sides. Associated symptoms include dysuria (resolved after antibiotics), nausea and vomiting. Pertinent negatives include no chills, fever or headaches.  Urinary Tract Infection  This is a new problem. The current episode started 1 to 4 weeks ago. The problem has been resolved. The patient is experiencing no pain. There has been no fever. Associated symptoms include nausea and vomiting. Pertinent negatives include no chills. She has tried antibiotics for the symptoms.  Gait disturbance s/p femur fx - she has finished home PT but feels that she might benefit from additional outpatient therapy. Nausea/vomiting - ongoing intermittent several days per week.  Gradually improving.  No real benefit from phenergan.  Seeing GI again tomorrow.  She has stopped all medications that might be contributing.  Lab Results  Component Value Date   CREATININE 1.04 (H) 08/16/2020   BUN 22 08/16/2020   NA 140 08/16/2020   K 3.5 08/16/2020   CL 105 08/16/2020   CO2 28 08/16/2020   Lab Results  Component Value Date   CHOL 206 (H) 01/09/2020   HDL 93 01/09/2020   LDLCALC 98 01/09/2020   TRIG 86 01/09/2020   CHOLHDL 2.2 01/09/2020   Lab Results  Component Value Date   TSH 3.538 06/09/2020   No results found for: HGBA1C Lab Results  Component Value Date   WBC 12.3 (H) 08/16/2020   HGB 13.9 08/16/2020   HCT 41.3 08/16/2020   MCV 89.4 08/16/2020   PLT 232 08/16/2020   Lab Results  Component Value  Date   ALT 17 07/09/2020   AST 24 07/09/2020   ALKPHOS 98 07/09/2020   BILITOT 0.7 07/09/2020     Review of Systems  Constitutional:  Negative for chills and fever.  Respiratory:  Negative for chest tightness, shortness of breath and wheezing.   Cardiovascular:  Negative for chest pain, palpitations and leg swelling.  Gastrointestinal:  Positive for nausea and vomiting.  Genitourinary:  Positive for dysuria (resolved after antibiotics), pelvic pain and vaginal pain.  Musculoskeletal:  Positive for arthralgias and gait problem.  Neurological:  Positive for weakness. Negative for dizziness and headaches.  Psychiatric/Behavioral:  Negative for dysphoric mood. The patient is not nervous/anxious.    Patient Active Problem List   Diagnosis Date Noted   Closed intracapsular fracture of left femur with routine healing 08/28/2020   Acquired thrombophilia ([place]) 07/15/2020   AF (paroxysmal atrial fibrillation) ([place]) 06/19/2020   Atrial fibrillation with rapid ventricular response ([place]) 06/09/2020   Fracture of femoral neck, left, closed ([place]) 06/05/2020   Atherosclerosis of aorta ([place]) 01/09/2020   Stage 2 moderate COPD by GOLD classification ([place]) 01/09/2020   HSV (herpes simplex virus) infection 01/09/2020   BCC (basal cell carcinoma), face 10/12/2017   Environmental and seasonal allergies 10/30/2014   Psoriasis vulgaris 10/30/2014   Essential (primary) hypertension 10/30/2014   Gastro-esophageal reflux disease without esophagitis 10/30/2014   Hot flash, menopausal 10/30/2014   Hyperlipidemia, mixed 10/30/2014   History of cancer  of left breast 10/30/2014   Bronchiectasis ([place]) 09/24/2013    Allergies  Allergen Reactions   Amoxicillin Nausea Only   Eliquis [Apixaban] Nausea Only   Xarelto [Rivaroxaban] Nausea Only    Past Surgical History:  Procedure Laterality Date   BREAST LUMPECTOMY Left 2008   CHOLECYSTECTOMY     HIP PINNING,CANNULATED Left 06/05/2020   Procedure:  CANNULATED HIP PINNING;  Surgeon: Hessie Knows, MD;  Location: [facility] ORS;  Service: Orthopedics;  Laterality: Left;   PARTIAL HYSTERECTOMY     TEE WITHOUT CARDIOVERSION N/A 06/10/2020   Procedure: TRANSESOPHAGEAL ECHOCARDIOGRAM (TEE);  Surgeon: Kate Sable, MD;  Location: [facility] ORS;  Service: Cardiovascular;  Laterality: N/A;    Social History   Tobacco Use   Smoking status: Never   Smokeless tobacco: Never  Vaping Use   Vaping Use: Never used  Substance Use Topics   Alcohol use: No   Drug use: No     Medication list has been reviewed and updated.  Current Meds  Medication Sig   atorvastatin (LIPITOR) 20 MG tablet Take 1 tablet (20 mg total) by mouth daily.   cetirizine (ZYRTEC) 10 MG tablet Take 1 tablet (10 mg total) by mouth daily.   fluticasone (FLONASE) 50 MCG/ACT nasal spray SHAKE LIQUID AND USE 2 SPRAYS IN EACH NOSTRIL DAILY AS NEEDED FOR ALLERGIES OR RHINITIS   hydrochlorothiazide (MICROZIDE) 12.5 MG capsule Take 12.5 mg by mouth daily.   metoprolol tartrate (LOPRESSOR) 25 MG tablet Take 0.5 tablets (12.5 mg total) by mouth 2 (two) times daily.   promethazine (PHENERGAN) 12.5 MG tablet Take 1 tablet (12.5 mg total) by mouth every 8 (eight) hours as needed for up to 5 days for nausea or vomiting.    PHQ 2/9 Scores 10/20/2020 07/15/2020 06/30/2020 02/10/2020  PHQ - 2 Score 0 0 0 0  PHQ- 9 Score '9 5 4 '$ -    GAD 7 : Generalized Anxiety Score 10/20/2020 07/15/2020 06/30/2020 01/09/2020  Nervous, Anxious, on Edge 0 0 0 0  Control/stop worrying 0 0 0 0  Worry too much - different things 0 0 0 0  Trouble relaxing 0 0 0 0  Restless 0 0 0 0  Easily annoyed or irritable 0 0 0 0  Afraid - awful might happen 0 0 0 0  Total GAD 7 Score 0 0 0 0  Anxiety Difficulty Not difficult at all Not difficult at all - Not difficult at all    BP Readings from Last 3 Encounters:  10/20/20 128/72  10/09/20 (!) 156/68  09/30/20 (!) 156/66    Physical Exam Vitals and nursing note reviewed.   Constitutional:      General: She is not in acute distress.    Appearance: Normal appearance. She is well-developed.  HENT:     Head: Normocephalic and atraumatic.  Cardiovascular:     Rate and Rhythm: Normal rate and regular rhythm.     Pulses: Normal pulses.  Pulmonary:     Effort: Pulmonary effort is normal. No respiratory distress.     Breath sounds: No wheezing or rhonchi.  Genitourinary:    Labia:        Right: Rash and tenderness present.        Left: Rash and tenderness present.      Vagina: Vaginal discharge, erythema and tenderness present.  Musculoskeletal:     Cervical back: Normal range of motion.  Lymphadenopathy:     Cervical: No cervical adenopathy.  Skin:    General: Skin is warm  and dry.     Findings: No rash.  Neurological:     Mental Status: She is alert and oriented to person, place, and time.  Psychiatric:        Mood and Affect: Mood normal.        Behavior: Behavior normal.    Wt Readings from Last 3 Encounters:  10/20/20 120 lb (54.4 kg)  10/09/20 119 lb (54 kg)  09/30/20 120 lb (54.4 kg)    BP 128/72 (BP Location: Right Arm, Patient Position: Sitting, Cuff Size: Normal)   Pulse 60   Temp 97.7 F (36.5 C) (Oral)   Ht '5\' 3"'$  (1.6 m)   Wt 120 lb (54.4 kg)   SpO2 96%   BMI 21.26 kg/m   Assessment and Plan: 1. Bacterial vaginosis Continue zinc oxide externally as needed - metroNIDAZOLE (METROGEL VAGINAL) 0.75 % vaginal gel; Place 1 Applicatorful vaginally 2 (two) times daily for 4 days.  Dispense: 70 g; Refill: 1 - POCT Wet Prep with KOH - clue cells, no yeast  2. E. coli UTI UA and micro negative today Completed course of Keflex with resolution of infection - POC urinalysis w microscopic (non auto)  3. Closed intracapsular fracture of left femur with routine healing Could benefit from outpatient PT ongoing - Ambulatory referral to Physical Therapy   Partially dictated using Dragon software. Any errors are unintentional.  Halina Maidens, MD [facility]  10/20/2020

## 2020-10-21 ENCOUNTER — Ambulatory Visit
Admission: RE | Admit: 2020-10-21 | Discharge: 2020-10-21 | Disposition: A | Discharge status: Home / Self Care | Payer: Medicare PPO | Source: Ambulatory Visit | Attending: Gastroenterology | Admitting: Gastroenterology

## 2020-10-21 DIAGNOSIS — R110 Nausea: Secondary | ICD-10-CM | POA: Diagnosis not present

## 2020-10-21 DIAGNOSIS — R109 Unspecified abdominal pain: Secondary | ICD-10-CM | POA: Diagnosis not present

## 2020-10-23 ENCOUNTER — Encounter: Payer: Self-pay | Admitting: Gastroenterology

## 2020-10-23 DIAGNOSIS — H401131 Primary open-angle glaucoma, bilateral, mild stage: Secondary | ICD-10-CM | POA: Diagnosis not present

## 2020-10-23 DIAGNOSIS — H2513 Age-related nuclear cataract, bilateral: Secondary | ICD-10-CM | POA: Diagnosis not present

## 2020-10-24 NOTE — Progress Notes (Signed)
Cardiology Office Note:    Date:  10/26/2020   ID:  [name], [name] February 14, 1934, MRN [id number]  PCP:  Glean Hess, MD  [facility] Cardiologist:  Nelva Bush, MD  [facility] Electrophysiologist:  None   Referring MD: Glean Hess, MD   Chief Complaint: 1 month follow-up  History of Present Illness:    [name] is a 87 y.o. female with a hx of afib on Eliquis, HTN, GERD, COPD, HLD, history of breast cancer s/p chemo and radiation who is being seen for 1 month follow-up.   Prior to her admission in 06/2020 she had not been seen by a cardiologist.   On 06/2020, she presented to [facility] ED s/p a fall and found to have L hip fx.  D-dimer elevated with chest CTA negative for pulmonary embolism.  On 4/1, she underwent left femoral neck pinning.  On 4/5, she was in new onset atrial fibrillation/flutter with RVR thought precipitated by left femoral hip fracture. Digoxin 250 MCG x1 was administered to assist for rate control.  She converted to NSR during admission with amiodarone added to maintain NSR.  Given a CHA2DS2-VASc score of at least 77 (age x2, gender, hypertension, coronary calcium noted on CT, female), she was transitioned to Eliquis 5 mg twice daily.  Volume overload was suspected given small pleural effusions on chest CT and crackles on exam.  Due to soft BP, net even to slightly negative fluid balance was recommended.  At discharge, she was continued on amiodarone 400 mg twice daily x6 days, subsequently reduced to 200 mg twice daily.     It was noted that, as an outpatient, she should be weaned to 200 mg daily.  Outpatient sleep evaluation was noted in the setting of RVSP elevation as below.  Low-dose oral diuretic was noted to possibly be needed in the future.   Subsequent echo showed EF 65 to 70%, NRWMA, mild LVH, G2 DD.  RVSP 49.5 mmHg/pulmonary hypertension, mild LAE.  Mitral valve was noted to be due to degenerative with mild mitral stenosis.  Estimated RAP 3  mmHg.   She was readmitted 4/15-4/24 shortness of breath, UTI, and AMS.  Foley catheter was removed and she was treated for yeast infection.  Mental status improved and she was discharged home.   She saw her PCP and reported GI issues with amiodarone discontinued.  Subsequently, she stopped all medications for a week to see if this would improve her GI issues.  This did not help, so she restarted her medications.     On 07/16/2020, she was seen in clinic and reported ongoing issues despite discontinuation of her amiodarone.  She was maintaining NSR.  She had restarted all of her cardiac medications today before clinic.  When seen 07/30/2020, she reported that her GI issues had stopped 3 days prior.  BP at home 120/60.  BP in clinic elevated with patient report that she had not taken her medications that morning.  She reported ongoing issues with orthopnea.  No medication changes made.   On 08/31/2020, she called the office to report nausea, attributed to apixaban.  Recommendation by her primary cardiologist was to hold the medication for at least 1 week to see if symptoms resolve.  Patient and family were notified that discontinuation of anticoagulation increase the risk of thrombolic events, including strokes  Last seen 09/14/20 and reported symptoms imporved off Eliquis. She was started on Xarelto '15mg'$  daily  Today, the patient reported Xarelto made her  sick to the stomach. She took Xarelto for 1 week. She reported nasuea every day with Xarelto that was worse. Even after she stopped Xarelto she till feels nauseous, but it is only in the morning. At this point we are not sure what is the etiology of nausea, she is following with GI And PCP. She has tried multiple antinausea meds that haven't helped. She has had multiple tests and everything comes back unremarkable. Sympsoms same as on Eliquis. No appetite, but still eating. She is in NSR today by EKG.   Past Medical History:  Diagnosis Date   Acute  metabolic encephalopathy 99991111   Allergy    Bronchiectasis ([place])    Cancer (HCC)    left breast cancer    COPD (chronic obstructive pulmonary disease) (HCC)    GERD (gastroesophageal reflux disease)    Hyperlipemia    Hypertension    Intractable nausea and vomiting 06/20/2018    Past Surgical History:  Procedure Laterality Date   BREAST LUMPECTOMY Left 2008   CHOLECYSTECTOMY     HIP PINNING,CANNULATED Left 06/05/2020   Procedure: CANNULATED HIP PINNING;  Surgeon: Hessie Knows, MD;  Location: [facility] ORS;  Service: Orthopedics;  Laterality: Left;   PARTIAL HYSTERECTOMY     TEE WITHOUT CARDIOVERSION N/A 06/10/2020   Procedure: TRANSESOPHAGEAL ECHOCARDIOGRAM (TEE);  Surgeon: Kate Sable, MD;  Location: [facility] ORS;  Service: Cardiovascular;  Laterality: N/A;    Current Medications: Current Meds  Medication Sig   atorvastatin (LIPITOR) 20 MG tablet Take 1 tablet (20 mg total) by mouth daily.   cetirizine (ZYRTEC) 10 MG tablet Take 1 tablet (10 mg total) by mouth daily.   dabigatran (PRADAXA) 150 MG CAPS capsule Take 1 capsule (150 mg total) by mouth 2 (two) times daily.   famotidine (PEPCID) 10 MG tablet Take 10 mg by mouth daily.   fluticasone (FLONASE) 50 MCG/ACT nasal spray SHAKE LIQUID AND USE 2 SPRAYS IN EACH NOSTRIL DAILY AS NEEDED FOR ALLERGIES OR RHINITIS   hydrochlorothiazide (MICROZIDE) 12.5 MG capsule Take 12.5 mg by mouth daily.   methotrexate (RHEUMATREX) 2.5 MG tablet Take 2.5 mg by mouth once a week. Caution:Chemotherapy. Protect from light.   metoprolol tartrate (LOPRESSOR) 25 MG tablet Take 0.5 tablets (12.5 mg total) by mouth 2 (two) times daily.   OXYGEN Inhale into the lungs. 2 lpm [place] nightly     Allergies:   Amoxicillin, Eliquis [apixaban], and Xarelto [rivaroxaban]   Social History   Socioeconomic History   Marital status: Married    Spouse name: Not on file   Number of children: 2   Years of education: Not on file   Highest education level: Not on  file  Occupational History   Not on file  Tobacco Use   Smoking status: Never   Smokeless tobacco: Never  Vaping Use   Vaping Use: Never used  Substance and Sexual Activity   Alcohol use: No   Drug use: No   Sexual activity: Not on file  Other Topics Concern   Not on file  Social History Narrative   Not on file   Social Determinants of Health   Financial Resource Strain: Low Risk    Difficulty of Paying Living Expenses: Not hard at all  Food Insecurity: No Food Insecurity   Worried About Charity fundraiser in the Last Year: Never true   [place] in the Last Year: Never true  Transportation Needs: No Transportation Needs   Lack of Transportation (Medical): No  Lack of Transportation (Non-Medical): No  Physical Activity: Sufficiently Active   Days of Exercise per Week: 7 days   Minutes of Exercise per Session: 30 min  Stress: No Stress Concern Present   Feeling of Stress : Not at all  Social Connections: Moderately Isolated   Frequency of Communication with Friends and Family: More than three times a week   Frequency of Social Gatherings with Friends and Family: Once a week   Attends Religious Services: Never   Marine scientist or Organizations: No   Attends Music therapist: Never   Marital Status: Married     Family History: The patient's family history includes Breast cancer in her sister.  ROS:   Please see the history of present illness.     All other systems reviewed and are negative.  EKGs/Labs/Other Studies Reviewed:    The following studies were reviewed today:  Echocardiogram 06/10/2020  1. Left ventricular ejection fraction, by estimation, is 65 to 70%. The  left ventricle has normal function. The left ventricle has no regional  wall motion abnormalities. There is mild left ventricular hypertrophy.  Left ventricular diastolic parameters  are consistent with Grade II diastolic dysfunction (pseudonormalization).   2. Right  ventricular systolic function is normal. The right ventricular  size is normal. There is moderately elevated pulmonary artery systolic  pressure. The estimated right ventricular systolic pressure is [id number] mmHg.   3. Left atrial size was mildly dilated.   4. The mitral valve is degenerative. No evidence of mitral valve  regurgitation. Mild mitral stenosis.   5. The aortic valve is tricuspid. Aortic valve regurgitation is not  visualized.   6. The inferior vena cava is normal in size with greater than 50%  respiratory variability, suggesting right atrial pressure of 3 mmHg.    MPI 10/08/2013 Pharmacological myocardial perfusion study without significant ischemia No significant wall motion abnormality noted EF 82% LV SF normal Overall low risk scan  EKG:  EKG is  ordered today.  The ekg ordered today demonstrates 60bpm  Recent Labs: 06/09/2020: B Natriuretic Peptide 185.9; TSH 3.538 06/23/2020: Magnesium 2.1 07/09/2020: ALT 17 08/16/2020: BUN 22; Creatinine, Ser 1.04; Hemoglobin 13.9; Platelets 232; Potassium 3.5; Sodium 140  Recent Lipid Panel    Component Value Date/Time   CHOL 206 (H) 01/09/2020 1026   CHOL 280 (H) 10/08/2013 0516   TRIG 86 01/09/2020 1026   TRIG 112 10/08/2013 0516   HDL 93 01/09/2020 1026   HDL 106 (H) 10/08/2013 0516   CHOLHDL 2.2 01/09/2020 1026   VLDL 22 10/08/2013 0516   LDLCALC 98 01/09/2020 1026   LDLCALC 152 (H) 10/08/2013 0516   Physical Exam:    VS:  BP 132/70 (BP Location: Left Arm, Patient Position: Sitting, Cuff Size: Normal)   Pulse 60   Ht '5\' 3"'$  (1.6 m)   Wt 121 lb (54.9 kg)   SpO2 91%   BMI 21.43 kg/m     Wt Readings from Last 3 Encounters:  10/26/20 121 lb (54.9 kg)  10/20/20 120 lb (54.4 kg)  10/09/20 119 lb (54 kg)     GEN:  Well nourished, well developed in no acute distress HEENT: Normal NECK: No JVD; No carotid bruits LYMPHATICS: No lymphadenopathy CARDIAC: RRR, no murmurs, rubs, gallops RESPIRATORY:  Clear to auscultation  without rales, wheezing or rhonchi  ABDOMEN: Soft, non-tender, non-distended MUSCULOSKELETAL:  No edema; No deformity  SKIN: Warm and dry NEUROLOGIC:  Alert and oriented x 3 PSYCHIATRIC:  Normal affect  ASSESSMENT:    1. Paroxysmal A-fib ([place])   2. Essential hypertension   3. Nausea   4. Chronic diastolic heart failure (HCC)    PLAN:    In order of problems listed above:  Paroxysmal Afib She is is NSR by EKG today. Worse nausea on Xarelto and Eliquis. CHADSVASC at least 6 (CHF, HTN, agex2, PAD, female). Discussed pradaxa vs warfarin, also discussed with DOD. Will try pradaxa '150mg'$  BID. Suspect underling issue not medication given persistent nausea off the cardiac medications. If she doesn't tolerate Pradaxa can consider warfarin. Follow-up in 1 month. Continue rate control with Lopressor.   HTN BP good today, 132/70. Continue current Lopressor 12.'5mg'$  BID, HCTZ 12.'5mg'$  daily.Marland Kitchen   HFpEF She is euvolemic on exam. Continue HCTZ and BB.   GI issues Undergoing extensive work-up with GI/PCP. So far no clear etiology for nausea.    Disposition: Follow up in 1 month(s) with Md/APP    Signed, Tajah Schreiner Ninfa Meeker, PA-C  10/26/2020 12:27 PM    [facility]

## 2020-10-26 ENCOUNTER — Ambulatory Visit: Payer: Medicare PPO | Admitting: Medical

## 2020-10-26 ENCOUNTER — Other Ambulatory Visit: Payer: Self-pay

## 2020-10-26 ENCOUNTER — Encounter: Payer: Self-pay | Admitting: Medical

## 2020-10-26 VITALS — BP 132/70 | HR 60 | Ht 63.0 in | Wt 121.0 lb

## 2020-10-26 DIAGNOSIS — I480 Paroxysmal atrial fibrillation: Secondary | ICD-10-CM

## 2020-10-26 DIAGNOSIS — R110 Nausea: Secondary | ICD-10-CM | POA: Diagnosis not present

## 2020-10-26 DIAGNOSIS — I5032 Chronic diastolic (congestive) heart failure: Secondary | ICD-10-CM

## 2020-10-26 DIAGNOSIS — I10 Essential (primary) hypertension: Secondary | ICD-10-CM | POA: Diagnosis not present

## 2020-10-26 MED ORDER — DABIGATRAN ETEXILATE MESYLATE 150 MG PO CAPS
150.0000 mg | ORAL_CAPSULE | Freq: Two times a day (BID) | ORAL | 6 refills | Status: DC
Start: 2020-10-26 — End: 2021-03-11

## 2020-10-26 NOTE — Patient Instructions (Addendum)
Medication Instructions:  Your physician has recommended you make the following change in your medication:   STOP Xarelto STOP Eliquis START Pradaxa 150 mg twice a day  *If you need a refill on your cardiac medications before your next appointment, please call your pharmacy*   Lab Work: None  If you have labs (blood work) drawn today and your tests are completely normal, you will receive your results only by: [place] (if you have MyChart) OR A paper copy in the mail If you have any lab test that is abnormal or we need to change your treatment, we will call you to review the results.   Testing/Procedures: None   Follow-Up: At [facility], you and your health needs are our priority.  As part of our continuing mission to provide you with exceptional heart care, we have created designated Provider Care Teams.  These Care Teams include your primary Cardiologist (physician) and Advanced Practice Providers (APPs -  Physician Assistants and Nurse Practitioners) who all work together to provide you with the care you need, when you need it.   Your next appointment:   1 month(s)  The format for your next appointment:   In Person  Provider:   You may see Nelva Bush, MD or one of the following Advanced Practice Providers on your designated Care Team:   Murray Hodgkins, NP Christell Faith, PA-C Marrianne Mood, PA-C Cadence Forreston, [place]

## 2020-10-28 ENCOUNTER — Telehealth: Payer: Self-pay

## 2020-10-28 NOTE — Telephone Encounter (Signed)
Called and spoke with patients husband (DPR on file) patient and husband are aware of upcoming Covid test, nothing further needed.

## 2020-10-30 ENCOUNTER — Encounter: Payer: Self-pay | Admitting: Internal Medicine

## 2020-11-02 ENCOUNTER — Other Ambulatory Visit: Payer: Self-pay

## 2020-11-02 ENCOUNTER — Other Ambulatory Visit
Admission: RE | Admit: 2020-11-02 | Discharge: 2020-11-02 | Disposition: A | Discharge status: Home / Self Care | Payer: Medicare PPO | Source: Ambulatory Visit | Attending: Pulmonary Disease | Admitting: Pulmonary Disease

## 2020-11-02 DIAGNOSIS — Z01812 Encounter for preprocedural laboratory examination: Secondary | ICD-10-CM | POA: Insufficient documentation

## 2020-11-02 DIAGNOSIS — Z20822 Contact with and (suspected) exposure to covid-19: Secondary | ICD-10-CM | POA: Diagnosis not present

## 2020-11-02 LAB — SARS CORONAVIRUS 2 (TAT 6-24 HRS): SARS Coronavirus 2: NEGATIVE

## 2020-11-03 ENCOUNTER — Ambulatory Visit: Payer: Medicare PPO | Attending: Pulmonary Disease

## 2020-11-03 DIAGNOSIS — J479 Bronchiectasis, uncomplicated: Secondary | ICD-10-CM

## 2020-11-03 MED ORDER — ALBUTEROL SULFATE (2.5 MG/3ML) 0.083% IN NEBU
2.5000 mg | INHALATION_SOLUTION | Freq: Once | RESPIRATORY_TRACT | Status: AC
  Administered 2020-11-03: 2.5 mg via RESPIRATORY_TRACT
  Filled 2020-11-03: qty 3

## 2020-11-11 ENCOUNTER — Telehealth: Payer: Self-pay

## 2020-11-11 ENCOUNTER — Ambulatory Visit: Payer: Medicare PPO | Admitting: Physical Therapy

## 2020-11-11 ENCOUNTER — Ambulatory Visit: Payer: Medicare PPO | Admitting: Internal Medicine

## 2020-11-11 ENCOUNTER — Encounter: Payer: Self-pay | Admitting: Internal Medicine

## 2020-11-11 ENCOUNTER — Other Ambulatory Visit: Payer: Self-pay

## 2020-11-11 VITALS — BP 130/72 | HR 66 | Temp 98.4°F | Ht 63.0 in | Wt 121.0 lb

## 2020-11-11 DIAGNOSIS — J479 Bronchiectasis, uncomplicated: Secondary | ICD-10-CM | POA: Diagnosis not present

## 2020-11-11 DIAGNOSIS — N761 Subacute and chronic vaginitis: Secondary | ICD-10-CM

## 2020-11-11 LAB — POCT WET PREP WITH KOH
KOH Prep POC: NEGATIVE
Trichomonas, UA: NEGATIVE

## 2020-11-11 MED ORDER — DOXYCYCLINE HYCLATE 100 MG PO TABS: 100.0000 mg | ORAL_TABLET | Freq: Two times a day (BID) | ORAL | 0 refills | Status: AC

## 2020-11-11 MED ORDER — CLINDAMYCIN PHOSPHATE 2 % VA CREA: 1.0000 | TOPICAL_CREAM | Freq: Every day | VAGINAL | 0 refills | Status: DC

## 2020-11-11 NOTE — Telephone Encounter (Signed)
Copied from [place] [id number]. Topic: General - Other >> Nov 11, 2020 12:00 PM Leward Quan A wrote: Reason for CRM: Katie with [facility] called in to inquire of Dr Army Melia about Rx sent in for clindamycin (CLEOCIN) 2 % vaginal cream need to know if it is a single dose just for one night or more asking for clarification please call [facility]

## 2020-11-11 NOTE — Progress Notes (Signed)
Date:  11/11/2020   Name:  [name]   DOB:  1933-04-14   MRN:  [id number]   Chief Complaint: Vaginal Pain (X1-2 months, Itching, was seen for this problem before problem is still the same medication did not work)  Vaginal Itching The patient's primary symptoms include genital itching and vaginal discharge. This is a chronic problem. The problem has been unchanged. The patient is experiencing no pain. Associated symptoms include a fever (low grade) and nausea. Pertinent negatives include no chills, constipation, diarrhea, dysuria, headaches or vomiting. The vaginal discharge was clear. There has been no bleeding. Treatments tried: metrogel x 2 courses. She is not sexually active.  Wheezing  This is a new problem. The current episode started in the past 7 days. Associated symptoms include coughing and a fever (low grade). Pertinent negatives include no chest pain, chills, diarrhea, headaches or vomiting. Her past medical history is significant for chronic lung disease.   Lab Results  Component Value Date   CREATININE 1.04 (H) 08/16/2020   BUN 22 08/16/2020   NA 140 08/16/2020   K 3.5 08/16/2020   CL 105 08/16/2020   CO2 28 08/16/2020   Lab Results  Component Value Date   CHOL 206 (H) 01/09/2020   HDL 93 01/09/2020   LDLCALC 98 01/09/2020   TRIG 86 01/09/2020   CHOLHDL 2.2 01/09/2020   Lab Results  Component Value Date   TSH 3.538 06/09/2020   No results found for: HGBA1C Lab Results  Component Value Date   WBC 12.3 (H) 08/16/2020   HGB 13.9 08/16/2020   HCT 41.3 08/16/2020   MCV 89.4 08/16/2020   PLT 232 08/16/2020   Lab Results  Component Value Date   ALT 17 07/09/2020   AST 24 07/09/2020   ALKPHOS 98 07/09/2020   BILITOT 0.7 07/09/2020     Review of Systems  Constitutional:  Positive for fatigue and fever (low grade). Negative for chills.  Respiratory:  Positive for cough and wheezing. Negative for chest tightness.   Cardiovascular:  Negative for chest  pain and leg swelling.  Gastrointestinal:  Positive for nausea. Negative for blood in stool, constipation, diarrhea and vomiting.  Genitourinary:  Positive for vaginal discharge. Negative for dysuria.  Neurological:  Negative for dizziness and headaches.   Patient Active Problem List   Diagnosis Date Noted   Closed intracapsular fracture of left femur with routine healing 08/28/2020   Acquired thrombophilia ([place]) 07/15/2020   AF (paroxysmal atrial fibrillation) ([place]) 06/19/2020   Atrial fibrillation with rapid ventricular response ([place]) 06/09/2020   Fracture of femoral neck, left, closed ([place]) 06/05/2020   Atherosclerosis of aorta ([place]) 01/09/2020   Stage 2 moderate COPD by GOLD classification ([place]) 01/09/2020   HSV (herpes simplex virus) infection 01/09/2020   BCC (basal cell carcinoma), face 10/12/2017   Environmental and seasonal allergies 10/30/2014   Psoriasis vulgaris 10/30/2014   Essential (primary) hypertension 10/30/2014   Gastro-esophageal reflux disease without esophagitis 10/30/2014   Hot flash, menopausal 10/30/2014   Hyperlipidemia, mixed 10/30/2014   History of cancer of left breast 10/30/2014   Bronchiectasis ([place]) 09/24/2013    Allergies  Allergen Reactions   Amoxicillin Nausea Only   Eliquis [Apixaban] Nausea Only   Xarelto [Rivaroxaban] Nausea Only    Past Surgical History:  Procedure Laterality Date   BREAST LUMPECTOMY Left 2008   CHOLECYSTECTOMY     HIP PINNING,CANNULATED Left 06/05/2020   Procedure: CANNULATED HIP PINNING;  Surgeon: Hessie Knows, MD;  Location: [facility]  ORS;  Service: Orthopedics;  Laterality: Left;   PARTIAL HYSTERECTOMY     TEE WITHOUT CARDIOVERSION N/A 06/10/2020   Procedure: TRANSESOPHAGEAL ECHOCARDIOGRAM (TEE);  Surgeon: Kate Sable, MD;  Location: [facility] ORS;  Service: Cardiovascular;  Laterality: N/A;    Social History   Tobacco Use   Smoking status: Never   Smokeless tobacco: Never  Vaping Use   Vaping Use: Never used   Substance Use Topics   Alcohol use: No   Drug use: No     Medication list has been reviewed and updated.  Current Meds  Medication Sig   atorvastatin (LIPITOR) 20 MG tablet Take 1 tablet (20 mg total) by mouth daily.   cetirizine (ZYRTEC) 10 MG tablet Take 1 tablet (10 mg total) by mouth daily.   dabigatran (PRADAXA) 150 MG CAPS capsule Take 1 capsule (150 mg total) by mouth 2 (two) times daily.   famotidine (PEPCID) 10 MG tablet Take 10 mg by mouth daily.   fluticasone (FLONASE) 50 MCG/ACT nasal spray SHAKE LIQUID AND USE 2 SPRAYS IN EACH NOSTRIL DAILY AS NEEDED FOR ALLERGIES OR RHINITIS   hydrochlorothiazide (MICROZIDE) 12.5 MG capsule Take 12.5 mg by mouth daily.   methotrexate (RHEUMATREX) 2.5 MG tablet Take 2.5 mg by mouth once a week. Caution:Chemotherapy. Protect from light.   metoprolol tartrate (LOPRESSOR) 25 MG tablet Take 0.5 tablets (12.5 mg total) by mouth 2 (two) times daily.   OXYGEN Inhale into the lungs. 2 lpm [place] nightly   promethazine (PHENERGAN) 12.5 MG tablet Take 1 tablet (12.5 mg total) by mouth every 8 (eight) hours as needed for up to 5 days for nausea or vomiting.    PHQ 2/9 Scores 10/20/2020 07/15/2020 06/30/2020 02/10/2020  PHQ - 2 Score 0 0 0 0  PHQ- 9 Score '9 5 4 '$ -    GAD 7 : Generalized Anxiety Score 10/20/2020 07/15/2020 06/30/2020 01/09/2020  Nervous, Anxious, on Edge 0 0 0 0  Control/stop worrying 0 0 0 0  Worry too much - different things 0 0 0 0  Trouble relaxing 0 0 0 0  Restless 0 0 0 0  Easily annoyed or irritable 0 0 0 0  Afraid - awful might happen 0 0 0 0  Total GAD 7 Score 0 0 0 0  Anxiety Difficulty Not difficult at all Not difficult at all - Not difficult at all    BP Readings from Last 3 Encounters:  11/11/20 130/72  10/26/20 132/70  10/20/20 128/72    Physical Exam Constitutional:      Appearance: Normal appearance.  Cardiovascular:     Rate and Rhythm: Normal rate and regular rhythm.  Pulmonary:     Effort: Pulmonary effort  is normal.     Breath sounds: Transmitted upper airway sounds present. Wheezing (RLL) present.  Abdominal:     General: Abdomen is flat.     Palpations: Abdomen is soft.     Tenderness: There is no abdominal tenderness.  Genitourinary:    Labia:        Right: Tenderness present. No lesion or injury.        Left: Tenderness present. No lesion or injury.      Vagina: Vaginal discharge present.  Musculoskeletal:     Cervical back: Normal range of motion.  Lymphadenopathy:     Cervical: No cervical adenopathy.  Skin:    Capillary Refill: Capillary refill takes less than 2 seconds.  Neurological:     Mental Status: She is alert.  Wt Readings from Last 3 Encounters:  11/11/20 121 lb (54.9 kg)  10/26/20 121 lb (54.9 kg)  10/20/20 120 lb (54.4 kg)    BP 130/72   Pulse 66   Temp 98.4 F (36.9 C) (Oral)   Ht '5\' 3"'$  (1.6 m)   Wt 121 lb (54.9 kg)   SpO2 96%   BMI 21.43 kg/m   Assessment and Plan: 1. Chronic vaginitis Wet prep still shows BV, no yeast or Trich Will try Cleocin vag cream - POCT Wet Prep with KOH - clindamycin (CLEOCIN) 2 % vaginal cream; Place 1 Applicatorful vaginally at bedtime.  Dispense: 40 g; Refill: 0  2. Bronchiectasis without complication ([place]) With wheezing and low grade fever Recommend using albuterol twice a day If sx continue, see Pulmonary - doxycycline (VIBRA-TABS) 100 MG tablet; Take 1 tablet (100 mg total) by mouth 2 (two) times daily for 10 days.  Dispense: 20 tablet; Refill: 0   Partially dictated using Editor, commissioning. Any errors are unintentional.  Halina Maidens, MD [facility]  11/11/2020

## 2020-11-11 NOTE — Telephone Encounter (Signed)
Called [name] let her know that the medication is for 7 days. She verbalized understanding.   KP

## 2020-11-16 DIAGNOSIS — J479 Bronchiectasis, uncomplicated: Secondary | ICD-10-CM | POA: Diagnosis not present

## 2020-11-17 ENCOUNTER — Other Ambulatory Visit: Payer: Self-pay

## 2020-11-17 ENCOUNTER — Ambulatory Visit: Payer: Medicare PPO | Attending: Internal Medicine | Admitting: Physical Therapy

## 2020-11-17 ENCOUNTER — Encounter: Payer: Self-pay | Admitting: Physical Therapy

## 2020-11-17 DIAGNOSIS — M6281 Muscle weakness (generalized): Secondary | ICD-10-CM | POA: Diagnosis not present

## 2020-11-17 DIAGNOSIS — R293 Abnormal posture: Secondary | ICD-10-CM | POA: Insufficient documentation

## 2020-11-17 DIAGNOSIS — M25552 Pain in left hip: Secondary | ICD-10-CM | POA: Insufficient documentation

## 2020-11-17 DIAGNOSIS — R262 Difficulty in walking, not elsewhere classified: Secondary | ICD-10-CM | POA: Diagnosis not present

## 2020-11-17 NOTE — Therapy (Signed)
[place] [facility] [address] [place], [place], [address] Phone: [phone]   Fax:  [phone]  Physical Therapy Evaluation  Patient Details  Name: DOLCE LOCH MRN: [id number] Date of Birth: [birth year] Referring Provider (PT): Halina Maidens   Encounter Date: 11/17/2020   PT End of Session - 11/17/20 1326     Visit Number 1    Number of Visits 12    Date for PT Re-Evaluation 12/29/20    Authorization Type IE: 11/17/2020    PT Start Time 1330    PT Stop Time 1415    PT Time Calculation (min) 45 min    Equipment Utilized During Treatment Gait belt    Activity Tolerance Patient tolerated treatment well    Behavior During Therapy [facility] for tasks assessed/performed             Past Medical History:  Diagnosis Date   Acute metabolic encephalopathy 99991111   Allergy    Bronchiectasis ([place])    Cancer ([place])    left breast cancer    COPD (chronic obstructive pulmonary disease) ([place])    GERD (gastroesophageal reflux disease)    Hyperlipemia    Hypertension    Intractable nausea and vomiting 06/20/2018    Past Surgical History:  Procedure Laterality Date   BREAST LUMPECTOMY Left 2008   CHOLECYSTECTOMY     HIP PINNING,CANNULATED Left 06/05/2020   Procedure: CANNULATED HIP PINNING;  Surgeon: Hessie Knows, MD;  Location: [facility] ORS;  Service: Orthopedics;  Laterality: Left;   PARTIAL HYSTERECTOMY     TEE WITHOUT CARDIOVERSION N/A 06/10/2020   Procedure: TRANSESOPHAGEAL ECHOCARDIOGRAM (TEE);  Surgeon: Kate Sable, MD;  Location: [facility] ORS;  Service: Cardiovascular;  Laterality: N/A;    There were no vitals filed for this visit.  PHYSICAL THERAPY EVALUATION   SCREENING Red Flags: none Have you had any night sweats? Unexplained weight loss? Saddle anesthesia? Unexplained changes in bowel or bladder habits?  Mental Status Patient is oriented to person, place and time.  Recent memory is intact.  Remote memory is  intact.  Attention span and concentration are intact.  Expressive speech is intact.  Patient's fund of knowledge is within normal limits for educational level.  POSTURE/OBSERVATIONS:  Increased thoracic kyphosis in sitting and standing  GAIT: Patient uses a SPC with a rubber grip at base for traction, decreased B step length, decreased B heel strike and slower gait speed due to fear of falling and unsteadiness   SUBJECTIVE  Chief Complaint: Patient states her walking is "off" and has been for awhile now and her constant nausea has limited her activity in the home and in the community. Patient suffered a fall resulting in a closed intracapsular fx of the L femur. Patient had surgery with pinning and multiple screws on the L hip 06/05/2020. Subsequent to hospitalization for surgical fixation, patient has had repeated medical complications including UTI, persistent nausea, pulmonary exacerbations; patient states that the ongoing nausea is her most bothersome and limiting symptom at present. Patient has minimal intermittent pain in the L anterior thigh, 2/10. Patient received home health physical therapy and enjoyed the exercises given like sidestepping across the countertop, walking outside with and without the cane, and toe taps. Patient has noted increased weakness in BLE despite participation in [place]. Patient and spouse Elonda Husky) indicate nausea is worse in the morning. Patient is under continued medical care and surveillance for other conditions including nausea; to date, no medications offered have been successful in consistently managing  the symptom. Patient enjoys walking around her neighborhood with a cane but is limited in participation/duration to increased fatigue and nausea.   Pertinent History:  Falls: positive for mechanical fall  Pulmonary disease/dysfunction: positive, COPD Stage 2 Surgical history: Positive for see above   Current activities: Reading, walking, used to golf   Patient  goals:  To be able to walk comfortably with or without the cane, gain confidence without fear of falling, possibly return to golfing as patient really enjoys the activity    RANGE OF MOTION: PROM   LEFT RIGHT  Hip Flexion (125):   ~90 WFL  Hip IR (45):  ~15 WFL  Hip ER (45):  ~15 WFL  Hip Abduction (40):  ~25 ~25  Knee flexion (135):  [facility] WFL  Knee extension (0):  WFL WFL    STRENGTH: MMT   RLE LLE  Hip Flexion 3 3  Hip Abduction  3 3  Hip Adduction  4 4  Hip ER  4 4  Hip IR  4 4  Knee Extension 4 4  Knee Flexion 4 4  Dorsiflexion  4 4  Plantarflexion (seated) 4 4     PALPATION:  Increased muscle tension and tenderness at L anterior thigh mm  OUTCOME MEASURES:   5TSTS (</=14.8 secs): 22.26 secs with use of BUE, posterior knees against chair, and CGA   ASSESSMENT Patient is a 87 year old presenting to clinic with chief complaints of gait and balance changes. Upon examination, patient demonstrates deficits in BLE strength, LLE muscle tightness, L hip ROM, posture, balance and gait as evidenced by B hip flexion and abduction 3/5 MMT, decreased L hip flexion, IR, ER, and abduction, tenderness at L anterior thigh musculature, use of single point cane short and long distances and 5TSTS of 22.26 seconds with BUE use and compensatory mechanisms for TKE (indicative of increased risk for falls). Patient's responses on FOTO-Hip (LEPF) outcome measure (33) indicates increased functional limitations/disability/distress. Patient's progress may be limited due to age related changes and secondary comorbidities; however, patient's motivation is advantageous. Patient was able to achieve basic understanding of gentle stretches to the adductors and increasing strength in the LE beginning with seated marches during today's evaluation. Patient will benefit from continued skilled therapeutic intervention to address deficits in BLE strength, ROM, muscle tension, posture, balance and gait in order to  decrease risk of fall, increase function, and improve overall QOL.       PT Long Term Goals - 11/17/20 1614       PT LONG TERM GOAL #1   Title Patient will demonstrate improved function as evidenced by a score of 55 on FOTO measure for full participation in activities at home and in the community without limitation.    Baseline IE: 33    Time 6    Period Weeks    Status New    Target Date 12/29/20      PT LONG TERM GOAL #2   Title Patient will demonstrate a 3 seconds improvement with 5TSTS with supervision, minimal use of BUE, and LRAD in order to reduce risk of falls and improve transfers.    Baseline IE: 22.26 secs    Time 6    Period Weeks    Status New    Target Date 12/29/20      PT LONG TERM GOAL #3   Title Patient will be able to demonstrate increased B hip strength in flexion and abduction, >/=4/5 MMT, in order to participate in activities  in the home and in the community without limitation.    Baseline IE: B LE 3/5 in flex/abd    Time 6    Period Weeks    Status New    Target Date 12/29/20      PT LONG TERM GOAL #4   Title Patient will report moderate difficulty or less with usual activities around the house and lifting an object from the floor in order to dmeonstrate an improvement in function and in preparation for return to PLOF and participation with community activties including but not limited to golf.    Baseline IE: "quite a bit of difficulty"    Time 6    Period Weeks    Status New    Target Date 12/29/20                    Plan - 11/17/20 1327     Clinical Impression Statement Patient is a 87 year old presenting to clinic with chief complaints of gait and balance changes. Upon examination, patient demonstrates deficits in BLE strength, LLE muscle tightness, L hip ROM, posture, balance and gait as evidenced by B hip flexion and abduction 3/5 MMT, decreased L hip flexion, IR, ER, and abduction, tenderness at L anterior thigh musculature, use of  single point cane short and long distances and 5TSTS of 22.26 seconds with BUE use and compensatory mechanisms for TKE (indicative of increased risk for falls). Patient's responses on FOTO-Hip (LEPF) outcome measure (33) indicates increased functional limitations/disability/distress. Patient's progress may be limited due to age related changes and secondary comorbidities; however, patient's motivation is advantageous. Patient was able to achieve basic understanding of gentle stretches to the adductors and increasing strength in the LE beginning with seated marches during today's evaluation. Patient will benefit from continued skilled therapeutic intervention to address deficits in BLE strength, ROM, muscle tension, posture, balance and gait in order to decrease risk of fall, increase function, and improve overall QOL.    Personal Factors and Comorbidities Age;Comorbidity 3+;Past/Current Experience;Time since onset of injury/illness/exacerbation    Comorbidities A-fib, acquired thrombophilia, A-fib, COPD Stage 2, GERD, HTN, hyperlipidemia, hx of breast cancer on L    Examination-Activity Limitations Transfers;Bed Mobility;Bend;Squat;Stairs;Locomotion Level;Stand    Examination-Participation Restrictions Interpersonal Relationship;Laundry;Community Activity;Shop    Stability/Clinical Decision Making Evolving/Moderate complexity    Clinical Decision Making Moderate    Rehab Potential Fair    PT Frequency 2x / week    PT Duration 6 weeks    PT Treatment/Interventions ADLs/Self Care Home Management;Aquatic Therapy;Cryotherapy;Iontophoresis '4mg'$ /ml Dexamethasone;Moist Heat;Gait training;Stair training;Functional mobility training;Therapeutic activities;Therapeutic exercise;Neuromuscular re-education;Patient/family education;Orthotic Fit/Training;Manual techniques;Passive range of motion;Taping;Joint Manipulations;Spinal Manipulations;Balance training;Energy conservation;Dry needling    PT Next Visit Plan  FGA/DGI/2MWT, muscle lengthening, muscle strengthening seated/supine/sidelying    PT Home Exercise Plan supine gentle adductor stretch, seated marches    Consulted and Agree with Plan of Care Patient;Family member/caregiver    Family Member Consulted Husband             Patient will benefit from skilled therapeutic intervention in order to improve the following deficits and impairments:  Abnormal gait, Decreased activity tolerance, Decreased balance, Decreased mobility, Decreased range of motion, Decreased strength, Difficulty walking, Hypomobility, Postural dysfunction, Improper body mechanics, Pain, Decreased endurance  Visit Diagnosis: Pain in left hip  Muscle weakness (generalized)  Difficulty in walking, not elsewhere classified  Abnormal posture     Problem List Patient Active Problem List   Diagnosis Date Noted   Closed intracapsular fracture of left femur  with routine healing 08/28/2020   Acquired thrombophilia ([place]) 07/15/2020   AF (paroxysmal atrial fibrillation) ([place]) 06/19/2020   Atrial fibrillation with rapid ventricular response ([place]) 06/09/2020   Fracture of femoral neck, left, closed ([place]) 06/05/2020   Atherosclerosis of aorta ([place]) 01/09/2020   Stage 2 moderate COPD by GOLD classification ([place]) 01/09/2020   HSV (herpes simplex virus) infection 01/09/2020   BCC (basal cell carcinoma), face 10/12/2017   Environmental and seasonal allergies 10/30/2014   Psoriasis vulgaris 10/30/2014   Essential (primary) hypertension 10/30/2014   Gastro-esophageal reflux disease without esophagitis 10/30/2014   Hot flash, menopausal 10/30/2014   Hyperlipidemia, mixed 10/30/2014   History of cancer of left breast 10/30/2014   Bronchiectasis ([place]) 09/24/2013   Jahari Wiginton, SPT This entire session was performed under direct supervision and direction of a licensed Chiropractor . I have personally read, edited and approve of the note as written.   Myles Gip PT, DPT [phone]   11/18/2020, 10:43 AM  [place] [facility] [address] [place], [place], [address] Phone: [phone]   Fax:  [phone]  Name: NOBUKO PROTO MRN: [id number] Date of Birth: June 19, 1933

## 2020-11-19 ENCOUNTER — Other Ambulatory Visit: Payer: Self-pay

## 2020-11-19 ENCOUNTER — Ambulatory Visit: Payer: Medicare PPO | Admitting: Physical Therapy

## 2020-11-19 ENCOUNTER — Telehealth: Payer: Self-pay

## 2020-11-19 ENCOUNTER — Encounter: Payer: Self-pay | Admitting: Physical Therapy

## 2020-11-19 DIAGNOSIS — R293 Abnormal posture: Secondary | ICD-10-CM | POA: Diagnosis not present

## 2020-11-19 DIAGNOSIS — M25552 Pain in left hip: Secondary | ICD-10-CM

## 2020-11-19 DIAGNOSIS — R262 Difficulty in walking, not elsewhere classified: Secondary | ICD-10-CM | POA: Diagnosis not present

## 2020-11-19 DIAGNOSIS — M6281 Muscle weakness (generalized): Secondary | ICD-10-CM

## 2020-11-19 NOTE — Therapy (Signed)
[facility] [address]. [place], [place], [address] Phone: [phone]   Fax:  [phone]  Physical Therapy Treatment  Patient Details  Name: [name] MRN: [id number] Date of Birth: Dec 23, 1933 Referring Provider (PT): Halina Maidens   Encounter Date: 11/19/2020   PT End of Session - 11/19/20 1156     Visit Number 2    Number of Visits 12    Date for PT Re-Evaluation 12/29/20    Authorization Type IE: 11/17/2020    PT Start Time 1200    PT Stop Time 1215    PT Time Calculation (min) 15 min    Equipment Utilized During Treatment Gait belt    Activity Tolerance Patient tolerated treatment well    Behavior During Therapy [facility] for tasks assessed/performed             Past Medical History:  Diagnosis Date   Acute metabolic encephalopathy 99991111   Allergy    Bronchiectasis ([place])    Cancer ([place])    left breast cancer    COPD (chronic obstructive pulmonary disease) ([place])    GERD (gastroesophageal reflux disease)    Hyperlipemia    Hypertension    Intractable nausea and vomiting 06/20/2018    Past Surgical History:  Procedure Laterality Date   BREAST LUMPECTOMY Left 2008   CHOLECYSTECTOMY     HIP PINNING,CANNULATED Left 06/05/2020   Procedure: CANNULATED HIP PINNING;  Surgeon: Hessie Knows, MD;  Location: [facility] ORS;  Service: Orthopedics;  Laterality: Left;   PARTIAL HYSTERECTOMY     TEE WITHOUT CARDIOVERSION N/A 06/10/2020   Procedure: TRANSESOPHAGEAL ECHOCARDIOGRAM (TEE);  Surgeon: Kate Sable, MD;  Location: [facility] ORS;  Service: Cardiovascular;  Laterality: N/A;    There were no vitals filed for this visit.   Subjective Assessment - 11/19/20 1156     Subjective Patient with increased nausea that began last night. Patient able to sleep during the night but this morning woke up with increased nausea which increased with movement. Patient was able to eat a little bit of breakfast.    Currently in Pain?  No/denies            TREATMENT:  Therapeutic Exercise:  2MWT (159m: 37 meters, use of SPC and CGA, patient with decreased arm swing on L, decreased step length, and downward gaze   Patient Response to interventions: Unable to progress with interventions as patient with increased nausea during 2MWT.     ASSESSMENT Patient presents to clinic with increased nausea that began this morning. Patient willing to participate in 2MWT with a SPC and CGA with minimal cueing/demonstration. Patient was able to walk 37 meters which indicates increased risk for falls based on age predicted norms. Patient unable to continue treatment session secondary to nausea and discomfort; however, patient will benefit from continued skilled therapeutic intervention to address deficits in BLE strength, ROM, posture, balance and gait in order to decrease risk of fall, increase function, and improve overall QOL.      PT Long Term Goals - 11/17/20 1614       PT LONG TERM GOAL #1   Title Patient will demonstrate improved function as evidenced by a score of 55 on FOTO measure for full participation in activities at home and in the community without limitation.    Baseline IE: 33    Time 6    Period Weeks    Status New    Target Date 12/29/20      PT LONG  TERM GOAL #2   Title Patient will demonstrate a 3 seconds improvement with 5TSTS with supervision, minimal use of BUE, and LRAD in order to reduce risk of falls and improve transfers.    Baseline IE: 22.26 secs    Time 6    Period Weeks    Status New    Target Date 12/29/20      PT LONG TERM GOAL #3   Title Patient will be able to demonstrate increased B hip strength in flexion and abduction, >/=4/5 MMT, in order to participate in activities in the home and in the community without limitation.    Baseline IE: B LE 3/5 in flex/abd    Time 6    Period Weeks    Status New    Target Date 12/29/20      PT LONG TERM GOAL #4   Title Patient will report  moderate difficulty or less with usual activities around the house and lifting an object from the floor in order to dmeonstrate an improvement in function and in preparation for return to PLOF and participation with community activties including but not limited to golf.    Baseline IE: "quite a bit of difficulty"    Time 6    Period Weeks    Status New    Target Date 12/29/20                   Plan - 11/19/20 1156     Clinical Impression Statement Patient presents to clinic with increased nausea that began this morning. Patient willing to participate in 2MWT with a SPC and CGA with minimal cueing/demonstration. Patient was able to walk 37 meters which indicates increased risk for falls based on age predicted norms. Patient unable to continue treatment session secondary to nausea and discomfort; however, patient will benefit from continued skilled therapeutic intervention to address deficits in BLE strength, ROM, posture, balance and gait in order to decrease risk of fall, increase function, and improve overall QOL.    Personal Factors and Comorbidities Age;Comorbidity 3+;Past/Current Experience;Time since onset of injury/illness/exacerbation    Comorbidities A-fib, acquired thrombophilia, A-fib, COPD Stage 2, GERD, HTN, hyperlipidemia, hx of breast cancer on L    Examination-Activity Limitations Transfers;Bed Mobility;Bend;Squat;Stairs;Locomotion Level;Stand    Examination-Participation Restrictions Interpersonal Relationship;Laundry;Community Activity;Shop    Stability/Clinical Decision Making Evolving/Moderate complexity    Rehab Potential Fair    PT Frequency 2x / week    PT Duration 6 weeks    PT Treatment/Interventions ADLs/Self Care Home Management;Aquatic Therapy;Cryotherapy;Iontophoresis '4mg'$ /ml Dexamethasone;Moist Heat;Gait training;Stair training;Functional mobility training;Therapeutic activities;Therapeutic exercise;Neuromuscular re-education;Patient/family education;Orthotic  Fit/Training;Manual techniques;Passive range of motion;Taping;Joint Manipulations;Spinal Manipulations;Balance training;Energy conservation;Dry needling    PT Next Visit Plan DGI, muscle lengthening, muscle strengthening seated/supine/sidelying    PT Home Exercise Plan --    Consulted and Agree with Plan of Care Patient;Family member/caregiver    Family Member Consulted Husband             Patient will benefit from skilled therapeutic intervention in order to improve the following deficits and impairments:  Abnormal gait, Decreased activity tolerance, Decreased balance, Decreased mobility, Decreased range of motion, Decreased strength, Difficulty walking, Hypomobility, Postural dysfunction, Improper body mechanics, Pain, Decreased endurance  Visit Diagnosis: Pain in left hip  Muscle weakness (generalized)  Difficulty in walking, not elsewhere classified  Abnormal posture     Problem List Patient Active Problem List   Diagnosis Date Noted   Closed intracapsular fracture of left femur with routine healing 08/28/2020   Acquired  thrombophilia ([place]) 07/15/2020   AF (paroxysmal atrial fibrillation) ([place]) 06/19/2020   Atrial fibrillation with rapid ventricular response ([place]) 06/09/2020   Fracture of femoral neck, left, closed ([place]) 06/05/2020   Atherosclerosis of aorta ([place]) 01/09/2020   Stage 2 moderate COPD by GOLD classification ([place]) 01/09/2020   HSV (herpes simplex virus) infection 01/09/2020   BCC (basal cell carcinoma), face 10/12/2017   Environmental and seasonal allergies 10/30/2014   Psoriasis vulgaris 10/30/2014   Essential (primary) hypertension 10/30/2014   Gastro-esophageal reflux disease without esophagitis 10/30/2014   Hot flash, menopausal 10/30/2014   Hyperlipidemia, mixed 10/30/2014   History of cancer of left breast 10/30/2014   Bronchiectasis ([place]) 09/24/2013   Kalden Wanke, SPT  This entire session was performed under direct supervision and direction  of a licensed Chiropractor . I have personally read, edited and approve of the note as written.  Myles Gip PT, DPT [phone]  11/19/2020, 3:59 PM   [facility] [address] [place], [place], [address] Phone: [phone]   Fax:  [phone]  Name: [name] MRN: [id number] Date of Birth: February 20, 1934

## 2020-11-20 ENCOUNTER — Ambulatory Visit: Payer: Self-pay | Admitting: *Deleted

## 2020-11-20 ENCOUNTER — Telehealth: Payer: Self-pay

## 2020-11-20 NOTE — Telephone Encounter (Signed)
Spoke to pt advised her to go to ED. Pt refused to go. Told pt if her symptoms get worse to go to ED. Pt verbalized understanding.  KP

## 2020-11-20 NOTE — Telephone Encounter (Signed)
Patient has chronic nausea- not able to eat Reason for Disposition  Taking prescription medication that could cause nausea (e.g., narcotics/opiates, antibiotics, OCPs, many others)  Answer Assessment - Initial Assessment Questions 1. NAUSEA SEVERITY: "How bad is the nausea?" (e.g., mild, moderate, severe; dehydration, weight loss)   - MILD: loss of appetite without change in eating habits   - MODERATE: decreased oral intake without significant weight loss, dehydration, or malnutrition   - SEVERE: inadequate caloric or fluid intake, significant weight loss, symptoms of dehydration     Unable to eat 2. ONSET: "When did the nausea begin?"     2 months 3. VOMITING: "Any vomiting?" If Yes, ask: "How many times today?"     no 4. RECURRENT SYMPTOM: "Have you had nausea before?" If Yes, ask: "When was the last time?" "What happened that time?"     Chronic- 2 months- patient is using Reglan, phenergan - patient not able to eat for last few days 5. CAUSE: "What do you think is causing the nausea?"     unsure 6. PREGNANCY: "Is there any chance you are pregnant?" (e.g., unprotected intercourse, missed birth control pill, broken condom)     N/a  Protocols used: Nausea-A-AH

## 2020-11-20 NOTE — Telephone Encounter (Signed)
Spoke to spouse Elonda Husky, ok per HIPAA... He declines scheduling an appt at this time and states he will call the office back

## 2020-11-20 NOTE — Telephone Encounter (Signed)
Copied from [place] [phone]. Topic: General - Other >> Nov 20, 2020  1:16 PM Lennox Solders wrote: Reason for CRM: Pt husband is aware waiting on dr berglund to reply to nurse triage message

## 2020-11-20 NOTE — Telephone Encounter (Signed)
Patient husband is calling- patient is unable to eat in the morning- she has been drinking 2 cups of coffee in am and maybe 1 cup of fluid during day. Husband states she is eating at lunch/supper- morning in the main thing. They have not contacted GI- they are requesting second opinion and would like referral for this. Husband is concerned about increasing weakness and nausea continuing. Patient was only ale to drink 1/2 cup coffee this morning. Call to office- advised call GI and ED for evaluation of weakness and dehydration. Husband states wants different GI and does not want to go to hospital- they send her home every time- discussed possible need for IV hydration due to low input. He would like to see PCP- call sent to office for review.

## 2020-11-20 NOTE — Telephone Encounter (Signed)
I was witness to agent, Constance Holster letting pt's husband know the office was closed for lunch and that someone would be calling them back.  Valli Glance, RN triaged pt earlier).   Awaiting response from provider.

## 2020-11-20 NOTE — Telephone Encounter (Signed)
Pt went to [facility] who told them to go to ED as well. Spoke to pt she stated she didn't not want to go to the ED. Told pt if symptoms worsen to go to ED. Pt verbalized understanding.  KP

## 2020-11-23 ENCOUNTER — Encounter: Payer: Self-pay | Admitting: Pulmonary Disease

## 2020-11-23 ENCOUNTER — Other Ambulatory Visit: Payer: Self-pay

## 2020-11-23 ENCOUNTER — Encounter: Payer: Medicare PPO | Admitting: Physical Therapy

## 2020-11-23 ENCOUNTER — Ambulatory Visit: Payer: Medicare PPO | Admitting: Pulmonary Disease

## 2020-11-23 VITALS — BP 128/78 | HR 69 | Temp 97.3°F | Ht 63.0 in | Wt 119.2 lb

## 2020-11-23 DIAGNOSIS — I272 Pulmonary hypertension, unspecified: Secondary | ICD-10-CM

## 2020-11-23 DIAGNOSIS — I5189 Other ill-defined heart diseases: Secondary | ICD-10-CM

## 2020-11-23 DIAGNOSIS — G4734 Idiopathic sleep related nonobstructive alveolar hypoventilation: Secondary | ICD-10-CM

## 2020-11-23 DIAGNOSIS — R110 Nausea: Secondary | ICD-10-CM

## 2020-11-23 DIAGNOSIS — J479 Bronchiectasis, uncomplicated: Secondary | ICD-10-CM

## 2020-11-23 NOTE — Patient Instructions (Addendum)
At your request I am sending a referral to Dr. [name] Reichert gastroenterologist that [facility].  Discuss with who prescribes your methotrexate to see if you can hold it for a month and see if that improves issues with your nausea.   With regards to your oxygen you need to use it at nighttime as your oxygen level goes down and you have increased pressure on the artery going from your heart to your lungs.   We will see him in follow-up in 4 months time call sooner should any new problems arise.

## 2020-11-23 NOTE — Progress Notes (Signed)
Subjective:    Patient ID: [name], female    DOB: [birth year], 87 y.o.   MRN: [id number] Patient Care Team: Yvonne Kendall, MD as PCP - Cardiology (Cardiology) Dasher, Cliffton Asters, MD (Dermatology)  Chief Complaint  Patient presents with   Follow-up    No current sx.     HPI [name] is an 87 year old lifelong never smoker who presents for follow up of pulmonary hypertension.  She was initially seen on 21 September 2020, referred by cardiology due to finding of pulmonary hypertension on her echocardiogram.  The patient presents today with no significant respiratory complaint.  She was noted to have moderate pulmonary hypertension by echocardiogram obtained in April.  This echocardiogram also showed that patient has grade II diastolic dysfunction and mild mitral stenosis.  The patient has also been noted to have paroxysmal atrial fibrillation she is on Pradaxa (started 2 weeks ago). She follows with cardiology with regards to her paroxysmal atrial fibrillation and diastolic dysfunction.  From the moderate pulmonary hypertension standpoint, however she is totally asymptomatic in this regard.  She has no sleep disturbance.  She does have frequent awakenings due to nocturia.  She states that this is not a new issue for her.  She has not had any chest pain, no orthopnea.  In the past, has had few episodes of paroxysmal nocturnal dyspnea but none recent.  She and her spouse sleep in separate rooms due to his snoring.  However she does not recall anyone ever telling her she snores and she does not have daytime somnolence.  She feels that she is rested in the morning when she arises.  She has frequent issues with heartburn and indigestion, she does have a hiatal hernia.  Since April she has noted frequent bouts of nausea.  He has not been evaluated by GI.  She has not had any abdominal pain.  She does not endorse any other symptomatology and overall is unsure why she is seeing [place] today.  She does not endorse  any fevers, chills or sweats.  No weight loss or anorexia.  No cough or sputum production.  She had an overnight oximetry study performed 08 October 2020 that shows significant nocturnal desaturations to as low as 82%.  She was started on oxygen at 2 L/min.  She has been compliant with the therapy and notes benefit from the same.   DATA: 02/20/2020 PFTs [facility]): FEV1 was 1.13 L or 73% predicted, FVC was 1.96 L or 90% predicted, FEV1/FVC was 58%, lung volumes were normal, diffusion capacity corrected for alveolar volume was 84%.  Flow volume loop was consistent with obstruction.  Consistent with obstructive defect.  Ambulatory oximetry performed same day showed no significant oxygen desaturations, oxygen saturations at rest on room air was 97% on exertion nadir was 95%. 06/10/2020 2D echo: LVEF 65 to 70% no wall motion abnormalities.  Grade II DD.  Right ventricular systolic function normal right ventricular size normal.  Moderately elevated pulmonary artery systolic pressure.  Mild mitral stenosis. 08/16/2020 CT angio chest: No PE.  Bilateral streaky densities with interstitial nodularity suggestive of atypical infection.  Emphysematous changes described likely senescent changes.  Bronchiectasis, small hiatal hernia. 10/08/2020 overnight oximetry: Patient qualified for oxygen nocturnally with O2 sat nadir at 82%.   Review of Systems A 10 point review of systems was performed and it is as noted above otherwise negative.  Patient Active Problem List   Diagnosis Date Noted   Closed intracapsular fracture of left femur with  routine healing 08/28/2020   Acquired thrombophilia (HCC) 07/15/2020   AF (paroxysmal atrial fibrillation) (HCC) 06/19/2020   Fracture of femoral neck, left, closed (HCC) 06/05/2020   Atherosclerosis of aorta (HCC) 01/09/2020   Stage 2 moderate COPD by GOLD classification (HCC) 01/09/2020   HSV (herpes simplex virus) infection 01/09/2020   BCC (basal cell carcinoma), face  10/12/2017   Environmental and seasonal allergies 10/30/2014   Psoriasis vulgaris 10/30/2014   Essential (primary) hypertension 10/30/2014   Gastro-esophageal reflux disease without esophagitis 10/30/2014   Hot flash, menopausal 10/30/2014   Hyperlipidemia, mixed 10/30/2014   History of cancer of left breast 10/30/2014   Bronchiectasis (HCC) 09/24/2013   Social History   Tobacco Use   Smoking status: Never   Smokeless tobacco: Never  Substance Use Topics   Alcohol use: No   Allergies  Allergen Reactions   Amoxicillin Nausea Only   Eliquis [Apixaban] Nausea Only   Current Meds  Medication Sig   cetirizine (ZYRTEC) 10 MG tablet Take 1 tablet (10 mg total) by mouth daily.   famotidine (PEPCID) 10 MG tablet Take 10 mg by mouth daily.   methotrexate (RHEUMATREX) 2.5 MG tablet Take 2.5 mg by mouth once a week. Caution:Chemotherapy. Protect from light.   atorvastatin (LIPITOR) 20 MG tablet Take 1 tablet (20 mg total) by mouth daily.   dabigatran (PRADAXA) 150 MG CAPS capsule Take 1 capsule (150 mg total) by mouth 2 (two) times daily.   fluticasone (FLONASE) 50 MCG/ACT nasal spray SHAKE LIQUID AND USE 2 SPRAYS IN EACH NOSTRIL DAILY AS NEEDED FOR ALLERGIES OR RHINITIS   hydrochlorothiazide (MICROZIDE) 12.5 MG capsule Take 12.5 mg by mouth daily.   metoprolol tartrate (LOPRESSOR) 25 MG tablet Take 0.5 tablets (12.5 mg total) by mouth 2 (two) times daily.   Immunization History  Administered Date(s) Administered   Influenza Inj Mdck Quad Pf 12/15/2017   Influenza,inj,Quad PF,6+ Mos 11/25/2014, 11/21/2019   Influenza-Unspecified 11/25/2016, 11/23/2018   PFIZER(Purple Top)SARS-COV-2 Vaccination 05/06/2019, 05/26/2019, 12/04/2019   Pfizer Covid-19 Vaccine Bivalent Booster 75yrs & up 12/11/2020   Pneumococcal Conjugate-13 11/22/2013   Pneumococcal Polysaccharide-23 03/08/2000   Tdap 07/06/2007   Zoster Recombinant(Shingrix) 01/06/2017, 05/17/2017   Zoster, Live 03/08/2005           Objective:   Physical Exam BP 128/78 (BP Location: Left Arm, Cuff Size: Normal)   Pulse 69   Temp (!) 97.3 F (36.3 C) (Temporal)   Ht 5\' 3"  (1.6 m)   Wt 119 lb 3.2 oz (54.1 kg)   SpO2 96%   BMI 21.12 kg/m  GENERAL: Frail-appearing elderly woman, ambulates with assistance of a cane.  No conversational dyspnea.  Awake, alert.  Fully oriented. HEAD: Normocephalic, atraumatic.  EYES: Pupils equal, round, reactive to light.  No scleral icterus.  MOUTH: Nose/mouth/throat not examined due to masking requirements for COVID 19. NECK: Supple. No thyromegaly. Trachea midline. No JVD.  No adenopathy. PULMONARY: Good air entry bilaterally.  No adventitious sounds. CARDIOVASCULAR: S1 and S2. Regular rate and rhythm.  Normal ABDOMEN: Benign. MUSCULOSKELETAL: No joint deformity, no clubbing, no edema.  NEUROLOGIC: No overt focal deficit, awake, alert fully oriented. SKIN: Intact,warm,dry. PSYCH: Mood and behavior normal.     Assessment & Plan:     ICD-10-CM   1. Bronchiectasis without complication (HCC)  J47.9    Asymptomatic Continue to monitor    2. Nocturnal hypoxemia  G47.34    Continue oxygen at 2 L/min Patient compliant with therapy Patient notes benefit of therapy  3. Pulmonary hypertension (HCC)  I27.20    Mild to moderate Continue nocturnal oxygen Likely secondary to diastolic dysfunction/mitral stenosis    4. Grade II diastolic dysfunction  I51.89    This issue adds complexity to her management Follows with cardiology    5. Nausea  R11.0 Ambulatory referral to Gastroenterology   Persistent since April Referred to GI Consider holiday from methotrexate     Orders Placed This Encounter  Procedures   Ambulatory referral to Gastroenterology    Referral Priority:   Routine    Referral Type:   Consultation    Referral Reason:   Specialty Services Required    Number of Visits Requested:   1   Will see the patient in follow-up in 4 months time she is to contact us  prior to that time should any new difficulties arise.  Gailen Shelter, MD Advanced Bronchoscopy PCCM [facility]    *This note was dictated using voice recognition software/Dragon.  Despite best efforts to proofread, errors can occur which can change the meaning. Any transcriptional errors that result from this process are unintentional and may not be fully corrected at the time of dictation.

## 2020-11-25 ENCOUNTER — Ambulatory Visit: Payer: Medicare PPO | Admitting: Internal Medicine

## 2020-11-25 ENCOUNTER — Other Ambulatory Visit: Payer: Self-pay

## 2020-11-25 ENCOUNTER — Encounter: Payer: Self-pay | Admitting: Internal Medicine

## 2020-11-25 VITALS — BP 128/74 | HR 47 | Temp 98.0°F | Resp 94 | Ht 63.0 in | Wt 121.0 lb

## 2020-11-25 DIAGNOSIS — Z1231 Encounter for screening mammogram for malignant neoplasm of breast: Secondary | ICD-10-CM | POA: Diagnosis not present

## 2020-11-25 DIAGNOSIS — R110 Nausea: Secondary | ICD-10-CM

## 2020-11-25 DIAGNOSIS — N761 Subacute and chronic vaginitis: Secondary | ICD-10-CM

## 2020-11-25 LAB — POCT WET PREP WITH KOH
KOH Prep POC: NEGATIVE
RBC Wet Prep HPF POC: 0
Trichomonas, UA: NEGATIVE
Yeast Wet Prep HPF POC: NEGATIVE

## 2020-11-25 MED ORDER — FLUCONAZOLE 100 MG PO TABS: 100.0000 mg | ORAL_TABLET | Freq: Every day | ORAL | 0 refills | Status: DC

## 2020-11-25 MED ORDER — PROMETHAZINE HCL 12.5 MG PO TABS: 12.5000 mg | ORAL_TABLET | Freq: Three times a day (TID) | ORAL | 0 refills | Status: DC | PRN

## 2020-11-25 MED ORDER — TRIAMCINOLONE ACETONIDE 0.025 % EX CREA: 1.0000 "application " | TOPICAL_CREAM | Freq: Two times a day (BID) | CUTANEOUS | 1 refills | Status: DC

## 2020-11-25 NOTE — Patient Instructions (Signed)
Hold atorvastatin while taking fluconazole

## 2020-11-25 NOTE — Progress Notes (Signed)
Date:  11/25/2020   Name:  [name]   DOB:  08-05-1933   MRN:  [id number]   Chief Complaint: Vaginitis (Chronic Vaginitis - pt still having pain.)  Vaginal Pain The patient's primary symptoms include a genital rash. The patient's pertinent negatives include no pelvic pain. This is a chronic problem. The current episode started 1 to 4 weeks ago. The problem occurs constantly. The problem has been gradually worsening. The problem affects both sides. She is not pregnant. Associated symptoms include a fever (describes sensation of face getting hot and low grade temperature), nausea and rash. Pertinent negatives include no chills or headaches. She has tried antibiotics for the symptoms. The treatment provided no relief. She is not sexually active.   Lab Results  Component Value Date   CREATININE 1.04 (H) 08/16/2020   BUN 22 08/16/2020   NA 140 08/16/2020   K 3.5 08/16/2020   CL 105 08/16/2020   CO2 28 08/16/2020   Lab Results  Component Value Date   CHOL 206 (H) 01/09/2020   HDL 93 01/09/2020   LDLCALC 98 01/09/2020   TRIG 86 01/09/2020   CHOLHDL 2.2 01/09/2020   Lab Results  Component Value Date   TSH 3.538 06/09/2020   No results found for: HGBA1C Lab Results  Component Value Date   WBC 12.3 (H) 08/16/2020   HGB 13.9 08/16/2020   HCT 41.3 08/16/2020   MCV 89.4 08/16/2020   PLT 232 08/16/2020   Lab Results  Component Value Date   ALT 17 07/09/2020   AST 24 07/09/2020   ALKPHOS 98 07/09/2020   BILITOT 0.7 07/09/2020     Review of Systems  Constitutional:  Positive for fever (describes sensation of face getting hot and low grade temperature). Negative for chills and diaphoresis.  Respiratory:  Negative for cough, chest tightness, shortness of breath and wheezing.   Cardiovascular:  Negative for chest pain.  Gastrointestinal:  Positive for nausea.  Genitourinary:  Positive for genital sores (labial itching) and vaginal pain. Negative for pelvic pain.  Skin:   Positive for rash.  Neurological:  Negative for dizziness and headaches.   Patient Active Problem List   Diagnosis Date Noted   Closed intracapsular fracture of left femur with routine healing 08/28/2020   Acquired thrombophilia ([place]) 07/15/2020   AF (paroxysmal atrial fibrillation) ([place]) 06/19/2020   Atrial fibrillation with rapid ventricular response ([place]) 06/09/2020   Fracture of femoral neck, left, closed ([place]) 06/05/2020   Atherosclerosis of aorta ([place]) 01/09/2020   Stage 2 moderate COPD by GOLD classification ([place]) 01/09/2020   HSV (herpes simplex virus) infection 01/09/2020   BCC (basal cell carcinoma), face 10/12/2017   Environmental and seasonal allergies 10/30/2014   Psoriasis vulgaris 10/30/2014   Essential (primary) hypertension 10/30/2014   Gastro-esophageal reflux disease without esophagitis 10/30/2014   Hot flash, menopausal 10/30/2014   Hyperlipidemia, mixed 10/30/2014   History of cancer of left breast 10/30/2014   Bronchiectasis ([place]) 09/24/2013    Allergies  Allergen Reactions   Amoxicillin Nausea Only   Eliquis [Apixaban] Nausea Only   Xarelto [Rivaroxaban] Nausea Only    Past Surgical History:  Procedure Laterality Date   BREAST LUMPECTOMY Left 2008   CHOLECYSTECTOMY     HIP PINNING,CANNULATED Left 06/05/2020   Procedure: CANNULATED HIP PINNING;  Surgeon: Hessie Knows, MD;  Location: [facility] ORS;  Service: Orthopedics;  Laterality: Left;   PARTIAL HYSTERECTOMY     TEE WITHOUT CARDIOVERSION N/A 06/10/2020   Procedure: TRANSESOPHAGEAL ECHOCARDIOGRAM (TEE);  Surgeon: Kate Sable, MD;  Location: [facility] ORS;  Service: Cardiovascular;  Laterality: N/A;    Social History   Tobacco Use   Smoking status: Never   Smokeless tobacco: Never  Vaping Use   Vaping Use: Never used  Substance Use Topics   Alcohol use: No   Drug use: No     Medication list has been reviewed and updated.  Current Meds  Medication Sig   atorvastatin (LIPITOR) 20 MG tablet  Take 1 tablet (20 mg total) by mouth daily.   cetirizine (ZYRTEC) 10 MG tablet Take 1 tablet (10 mg total) by mouth daily.   dabigatran (PRADAXA) 150 MG CAPS capsule Take 1 capsule (150 mg total) by mouth 2 (two) times daily.   famotidine (PEPCID) 10 MG tablet Take 10 mg by mouth daily.   fluticasone (FLONASE) 50 MCG/ACT nasal spray SHAKE LIQUID AND USE 2 SPRAYS IN EACH NOSTRIL DAILY AS NEEDED FOR ALLERGIES OR RHINITIS   hydrochlorothiazide (MICROZIDE) 12.5 MG capsule Take 12.5 mg by mouth daily.   methotrexate (RHEUMATREX) 2.5 MG tablet Take 2.5 mg by mouth once a week. Caution:Chemotherapy. Protect from light.   metoprolol tartrate (LOPRESSOR) 25 MG tablet Take 0.5 tablets (12.5 mg total) by mouth 2 (two) times daily.   OXYGEN Inhale into the lungs. 2 lpm  nightly    PHQ 2/9 Scores 11/11/2020 10/20/2020 07/15/2020 06/30/2020  PHQ - 2 Score 0 0 0 0  PHQ- 9 Score 0 9 5 4     GAD 7 : Generalized Anxiety Score 11/11/2020 10/20/2020 07/15/2020 06/30/2020  Nervous, Anxious, on Edge 0 0 0 0  Control/stop worrying 0 0 0 0  Worry too much - different things 0 0 0 0  Trouble relaxing 0 0 0 0  Restless 0 0 0 0  Easily annoyed or irritable 0 0 0 0  Afraid - awful might happen 0 0 0 0  Total GAD 7 Score 0 0 0 0  Anxiety Difficulty - Not difficult at all Not difficult at all -    BP Readings from Last 3 Encounters:  11/25/20 128/74  11/23/20 128/78  11/11/20 130/72    Physical Exam Constitutional:      General: She is not in acute distress. Cardiovascular:     Rate and Rhythm: Normal rate and regular rhythm.  Pulmonary:     Effort: Pulmonary effort is normal.     Breath sounds: No wheezing or rhonchi.  Abdominal:     General: Abdomen is flat.     Palpations: Abdomen is soft.  Genitourinary:    Labia:        Right: Tenderness present.        Left: Tenderness present.      Comments: Labial redness without lesions, scant white discharge Digital vaginal exam - no mass or bleeding Wet Prep  sample obtained Neurological:     Mental Status: She is alert.    Wt Readings from Last 3 Encounters:  11/25/20 121 lb (54.9 kg)  11/23/20 119 lb 3.2 oz (54.1 kg)  11/11/20 121 lb (54.9 kg)    BP 128/74   Pulse (!) 47   Temp 98 F (36.7 C) (Oral)   Resp (!) 94   Ht 5\' 3"  (1.6 m)   Wt 121 lb (54.9 kg)   BMI 21.43 kg/m   Assessment and Plan: 1. Chronic vaginitis Failed 2 courses of antibiotics for BV Will try Diflucan despite negative wet prep due to lack of response - POCT Wet Prep with  KOH - fluconazole (DIFLUCAN) 100 MG tablet; Take 1 tablet (100 mg total) by mouth daily for 7 days.  Dispense: 7 tablet; Refill: 0 - triamcinolone (KENALOG) 0.025 % cream; Apply 1 application topically 2 (two) times daily. To external genitalia  Dispense: 30 g; Refill: 1  2. Chronic nausea Continue phenergan Pulmonary has referred her to [facility] - promethazine (PHENERGAN) 12.5 MG tablet; Take 1 tablet (12.5 mg total) by mouth every 8 (eight) hours as needed for up to 5 days for nausea or vomiting.  Dispense: 30 tablet; Refill: 0  3. Encounter for screening mammogram for breast cancer She will schedule at [place]   Partially dictated using Dragon software. Any errors are unintentional.  Halina Maidens, MD [facility]  11/25/2020

## 2020-11-26 ENCOUNTER — Ambulatory Visit: Payer: Medicare PPO | Admitting: Physical Therapy

## 2020-11-26 ENCOUNTER — Encounter: Payer: Self-pay | Admitting: Physical Therapy

## 2020-11-26 DIAGNOSIS — R293 Abnormal posture: Secondary | ICD-10-CM

## 2020-11-26 DIAGNOSIS — M25552 Pain in left hip: Secondary | ICD-10-CM | POA: Diagnosis not present

## 2020-11-26 DIAGNOSIS — R262 Difficulty in walking, not elsewhere classified: Secondary | ICD-10-CM | POA: Diagnosis not present

## 2020-11-26 DIAGNOSIS — M6281 Muscle weakness (generalized): Secondary | ICD-10-CM

## 2020-11-26 NOTE — Therapy (Signed)
[facility] [address] [place], [place], [address] Phone: [phone]   Fax:  [phone]  Physical Therapy Treatment  Patient Details  Name: [name] MRN: [id number] Date of Birth: 11/10/1933 Referring Provider (PT): Halina Maidens   Encounter Date: 11/26/2020   PT End of Session - 11/26/20 1153     Visit Number 3    Number of Visits 12    Date for PT Re-Evaluation 12/29/20    Authorization Type IE: 11/17/2020    PT Start Time 1155    PT Stop Time 1240    PT Time Calculation (min) 45 min    Equipment Utilized During Treatment Gait belt    Activity Tolerance Patient tolerated treatment well    Behavior During Therapy [facility] for tasks assessed/performed             Past Medical History:  Diagnosis Date   Acute metabolic encephalopathy 91/50/5697   Allergy    Bronchiectasis ([place])    Cancer ([place])    left breast cancer    COPD (chronic obstructive pulmonary disease) ([place])    GERD (gastroesophageal reflux disease)    Hyperlipemia    Hypertension    Intractable nausea and vomiting 06/20/2018    Past Surgical History:  Procedure Laterality Date   BREAST LUMPECTOMY Left 2008   CHOLECYSTECTOMY     HIP PINNING,CANNULATED Left 06/05/2020   Procedure: CANNULATED HIP PINNING;  Surgeon: Hessie Knows, MD;  Location: [facility] ORS;  Service: Orthopedics;  Laterality: Left;   PARTIAL HYSTERECTOMY     TEE WITHOUT CARDIOVERSION N/A 06/10/2020   Procedure: TRANSESOPHAGEAL ECHOCARDIOGRAM (TEE);  Surgeon: Kate Sable, MD;  Location: [facility] ORS;  Service: Cardiovascular;  Laterality: N/A;    There were no vitals filed for this visit.   Subjective Assessment - 11/26/20 1153     Subjective Patient with some nausea today but not as bad as last session. Patient does report some pain in the L hip but is able to move it around and creates some relief.    Currently in Pain? No/denies              TREATMENT: Neuromuscular  Re-education  DGI (>22/24): 15/24, use of SPC and CGA, increased dizziness with vertical head movements  STS with B UE support of handrail, x10, CGA for safety and balance  STS with single UE support of handrail, x10 CGA for safety and balance  STS with no UE support or supported surface, x3, CGA for safety balance  STS with no UE support and raised surface (airex on chair), x10, CGA for safety and balance   Standing hip marches, extension, abduction in // bars, x10 B for improved postural stability/LE strengthening to decrease risk of falls  Standing hip flexor stretch, Bx 30 secs in // bars for improved postural stability/hip flexor lengthening  Patient educated throughout session on appropriate technique and form using multi-modal cueing, HEP, and activity modification. Patient articulated understanding and returned demonstration.  Patient Response to interventions: Patient with increased difficulty rising from a chair without a raised surface and UE support even with VCs for proper body mechanics.     ASSESSMENT Patient presents to clinic with excellent motivation to participate in today's session. Patient demonstrates deficits in BLE strength, LLE muscle tightness, L hip ROM, posture, balance and gait. Patient scored a 15/24 on the DGI with a SPC and CGA with minimal cueing/demonstration (increased risk for falling). Patient tolerated standing LE strength interventions well with minimal  cueing for upright posture. Patient unable to perform a STS without UE support from a chair but demonstrated skill when seat was raised (airex pad). Patient with basic understanding of LE strengthening and postural stability in order to return to prior activities and decrease use of SPC. Patient will benefit from continued skilled therapeutic intervention to address deficits in BLE strength, ROM, posture, balance and gait in order to decrease risk of fall, increase function, and improve overall QOL.    PT  Long Term Goals - 11/17/20 1614       PT LONG TERM GOAL #1   Title Patient will demonstrate improved function as evidenced by a score of 55 on FOTO measure for full participation in activities at home and in the community without limitation.    Baseline IE: 33    Time 6    Period Weeks    Status New    Target Date 12/29/20      PT LONG TERM GOAL #2   Title Patient will demonstrate a 3 seconds improvement with 5TSTS with supervision, minimal use of BUE, and LRAD in order to reduce risk of falls and improve transfers.    Baseline IE: 22.26 secs    Time 6    Period Weeks    Status New    Target Date 12/29/20      PT LONG TERM GOAL #3   Title Patient will be able to demonstrate increased B hip strength in flexion and abduction, >/=4/5 MMT, in order to participate in activities in the home and in the community without limitation.    Baseline IE: B LE 3/5 in flex/abd    Time 6    Period Weeks    Status New    Target Date 12/29/20      PT LONG TERM GOAL #4   Title Patient will report moderate difficulty or less with usual activities around the house and lifting an object from the floor in order to dmeonstrate an improvement in function and in preparation for return to PLOF and participation with community activties including but not limited to golf.    Baseline IE: "quite a bit of difficulty"    Time 6    Period Weeks    Status New    Target Date 12/29/20                   Plan - 11/26/20 1154     Clinical Impression Statement Patient presents to clinic with excellent motivation to participate in today's session. Patient demonstrates deficits in BLE strength, LLE muscle tightness, L hip ROM, posture, balance and gait. Patient scored a 15/24 on the DGI with a SPC and CGA with minimal cueing/demonstration (increased risk for falling). Patient tolerated standing LE strength interventions well with minimal cueing for upright posture. Patient unable to perform a STS without UE  support from a chair but demonstrated skill when seat was raised (airex pad). Patient with basic understanding of LE strengthening and postural stability in order to return to prior activities and decrease use of SPC. Patient will benefit from continued skilled therapeutic intervention to address deficits in BLE strength, ROM, posture, balance and gait in order to decrease risk of fall, increase function, and improve overall QOL.    Personal Factors and Comorbidities Age;Comorbidity 3+;Past/Current Experience;Time since onset of injury/illness/exacerbation    Comorbidities A-fib, acquired thrombophilia, A-fib, COPD Stage 2, GERD, HTN, hyperlipidemia, hx of breast cancer on L    Examination-Activity Limitations Transfers;Bed Mobility;Bend;Squat;Stairs;Locomotion Level;Stand  Examination-Participation Restrictions Interpersonal Relationship;Laundry;Community Activity;Shop    Stability/Clinical Decision Making Evolving/Moderate complexity    Rehab Potential Fair    PT Frequency 2x / week    PT Duration 6 weeks    PT Treatment/Interventions ADLs/Self Care Home Management;Aquatic Therapy;Cryotherapy;Iontophoresis 4mg /ml Dexamethasone;Moist Heat;Gait training;Stair training;Functional mobility training;Therapeutic activities;Therapeutic exercise;Neuromuscular re-education;Patient/family education;Orthotic Fit/Training;Manual techniques;Passive range of motion;Taping;Joint Manipulations;Spinal Manipulations;Balance training;Energy conservation;Dry needling    PT Next Visit Plan STS, hip strengthening supine, walking    PT Home Exercise Plan standing abd/ext/flex, standing hip flexor stretch    Consulted and Agree with Plan of Care Patient;Family member/caregiver    Family Member Consulted Husband             Patient will benefit from skilled therapeutic intervention in order to improve the following deficits and impairments:  Abnormal gait, Decreased activity tolerance, Decreased balance, Decreased  mobility, Decreased range of motion, Decreased strength, Difficulty walking, Hypomobility, Postural dysfunction, Improper body mechanics, Pain, Decreased endurance  Visit Diagnosis: Pain in left hip  Muscle weakness (generalized)  Difficulty in walking, not elsewhere classified  Abnormal posture     Problem List Patient Active Problem List   Diagnosis Date Noted   Closed intracapsular fracture of left femur with routine healing 08/28/2020   Acquired thrombophilia ([place]) 07/15/2020   AF (paroxysmal atrial fibrillation) ([place]) 06/19/2020   Atrial fibrillation with rapid ventricular response ([place]) 06/09/2020   Fracture of femoral neck, left, closed ([place]) 06/05/2020   Atherosclerosis of aorta ([place]) 01/09/2020   Stage 2 moderate COPD by GOLD classification ([place]) 01/09/2020   HSV (herpes simplex virus) infection 01/09/2020   BCC (basal cell carcinoma), face 10/12/2017   Environmental and seasonal allergies 10/30/2014   Psoriasis vulgaris 10/30/2014   Essential (primary) hypertension 10/30/2014   Gastro-esophageal reflux disease without esophagitis 10/30/2014   Hot flash, menopausal 10/30/2014   Hyperlipidemia, mixed 10/30/2014   History of cancer of left breast 10/30/2014   Bronchiectasis ([place]) 09/24/2013    Kenidee Cregan, SPT  This entire session was performed under direct supervision and direction of a licensed Chiropractor . I have personally read, edited and approve of the note as written.  Myles Gip PT, DPT [phone]  11/26/2020, 1:09 PM   [facility] [address] [place], [place], [address] Phone: [phone]   Fax:  [phone]  Name: [name] MRN: [id number] Date of Birth: [birth year]

## 2020-12-01 ENCOUNTER — Telehealth: Payer: Self-pay | Admitting: Pulmonary Disease

## 2020-12-01 ENCOUNTER — Ambulatory Visit: Payer: Medicare PPO | Admitting: Physical Therapy

## 2020-12-01 ENCOUNTER — Encounter: Payer: Self-pay | Admitting: Physical Therapy

## 2020-12-01 ENCOUNTER — Other Ambulatory Visit: Payer: Self-pay

## 2020-12-01 DIAGNOSIS — R262 Difficulty in walking, not elsewhere classified: Secondary | ICD-10-CM

## 2020-12-01 DIAGNOSIS — M25552 Pain in left hip: Secondary | ICD-10-CM

## 2020-12-01 DIAGNOSIS — R293 Abnormal posture: Secondary | ICD-10-CM | POA: Diagnosis not present

## 2020-12-01 DIAGNOSIS — M6281 Muscle weakness (generalized): Secondary | ICD-10-CM | POA: Diagnosis not present

## 2020-12-01 NOTE — Progress Notes (Signed)
Cardiology Office Note:    Date:  12/02/2020   ID:  CAMELLA Murphy, DOB [birth year], MRN [id number]  PCP:  Glean Hess, MD  [facility] Cardiologist:  Nelva Bush, MD  [facility] Electrophysiologist:  None   Referring MD: Glean Hess, MD   Chief Complaint: 1 month follow-up  History of Present Illness:    [name] is a 87 y.o. female with a hx of afib, HTN, GERD, COPD, HLD, history of breast cancer s/p chemo and radiation who is being seen for 1 month follow-up.    Prior to her admission in 06/2020 she had not been seen by a cardiologist.    On 06/2020, she presented to [facility] ED s/p a fall and found to have L hip fx.  D-dimer elevated with chest CTA negative for pulmonary embolism.  On 4/1, she underwent left femoral neck pinning.  On 4/5, she was in new onset atrial fibrillation/flutter with RVR thought precipitated by left femoral hip fracture. Digoxin 250 MCG x1 was administered to assist for rate control.  She converted to NSR during admission with amiodarone added to maintain NSR.  Given a CHA2DS2-VASc score of at least 37 (age x2, gender, hypertension, coronary calcium noted on CT, female), she was transitioned to Eliquis 5 mg twice daily.  Volume overload was suspected given small pleural effusions on chest CT and crackles on exam.  Due to soft BP, net even to slightly negative fluid balance was recommended.  At discharge, she was continued on amiodarone 400 mg twice daily x6 days, subsequently reduced to 200 mg twice daily.     It was noted that, as an outpatient, she should be weaned to 200 mg daily.  Outpatient sleep evaluation was noted in the setting of RVSP elevation as below.  Low-dose oral diuretic was noted to possibly be needed in the future.   Subsequent echo showed EF 65 to 70%, NRWMA, mild LVH, G2 DD.  RVSP 49.5 mmHg/pulmonary hypertension, mild LAE.  Mitral valve was noted to be due to degenerative with mild mitral stenosis.  Estimated RAP 3 mmHg.    She was readmitted 4/15-4/24 shortness of breath, UTI, and AMS.  Foley catheter was removed and she was treated for yeast infection.  Mental status improved and she was discharged home.   She saw her PCP and reported GI issues with amiodarone discontinued.  Subsequently, she stopped all medications for a week to see if this would improve her GI issues.  This did not help, so she restarted her medications.     On 07/16/2020, she was seen in clinic and reported ongoing issues despite discontinuation of her amiodarone.  She was maintaining NSR.  She had restarted all of her cardiac medications today before clinic.  When seen 07/30/2020, she reported that her GI issues had stopped 3 days prior.  BP at home 120/60.  BP in clinic elevated with patient report that she had not taken her medications that morning.  She reported ongoing issues with orthopnea.  No medication changes made.   On 08/31/2020, she called the office to report nausea, attributed to apixaban.  Recommendation by her primary cardiologist was to hold the medication for at least 1 week to see if symptoms resolve.  Patient and family were notified that discontinuation of anticoagulation increase the risk of thrombolic events, including strokes   Seen 09/14/20 and reported symptoms improved off Eliquis. She was started on Xarelto 15mg  daily.   Last seen 10/26/20 and reported she  was sick with Xarelto and she only took it for a week. She was in NSR. She was started on Pradaxa.   Today, the patient reports she is tolerating Pradaxa, denies worse nausea. She is still having nasuea, it is not as bad as it was. Following with GI and still unsure why she is still having nasuea. No chest pain or shortness of breath. She is in NSR.    Past Medical History:  Diagnosis Date   Acute metabolic encephalopathy 41/28/7867   Allergy    Bronchiectasis ([place])    Cancer (HCC)    left breast cancer    COPD (chronic obstructive pulmonary disease) (HCC)    GERD  (gastroesophageal reflux disease)    Hyperlipemia    Hypertension    Intractable nausea and vomiting 06/20/2018    Past Surgical History:  Procedure Laterality Date   BREAST LUMPECTOMY Left 2008   CHOLECYSTECTOMY     HIP PINNING,CANNULATED Left 06/05/2020   Procedure: CANNULATED HIP PINNING;  Surgeon: Hessie Knows, MD;  Location: [facility] ORS;  Service: Orthopedics;  Laterality: Left;   PARTIAL HYSTERECTOMY     TEE WITHOUT CARDIOVERSION N/A 06/10/2020   Procedure: TRANSESOPHAGEAL ECHOCARDIOGRAM (TEE);  Surgeon: Kate Sable, MD;  Location: [facility] ORS;  Service: Cardiovascular;  Laterality: N/A;    Current Medications: No outpatient medications have been marked as taking for the 12/02/20 encounter (Office Visit) with Kathlen Mody, Barbie Croston H, PA-C.     Allergies:   Amoxicillin, Eliquis [apixaban], and Xarelto [rivaroxaban]   Social History   Socioeconomic History   Marital status: Married    Spouse name: Not on file   Number of children: 2   Years of education: Not on file   Highest education level: Not on file  Occupational History   Not on file  Tobacco Use   Smoking status: Never   Smokeless tobacco: Never  Vaping Use   Vaping Use: Never used  Substance and Sexual Activity   Alcohol use: No   Drug use: No   Sexual activity: Not on file  Other Topics Concern   Not on file  Social History Narrative   Not on file   Social Determinants of Health   Financial Resource Strain: Low Risk    Difficulty of Paying Living Expenses: Not hard at all  Food Insecurity: No Food Insecurity   Worried About Charity fundraiser in the Last Year: Never true   [place] in the Last Year: Never true  Transportation Needs: No Transportation Needs   Lack of Transportation (Medical): No   Lack of Transportation (Non-Medical): No  Physical Activity: Sufficiently Active   Days of Exercise per Week: 7 days   Minutes of Exercise per Session: 30 min  Stress: No Stress Concern Present    Feeling of Stress : Not at all  Social Connections: Moderately Isolated   Frequency of Communication with Friends and Family: More than three times a week   Frequency of Social Gatherings with Friends and Family: Once a week   Attends Religious Services: Never   Marine scientist or Organizations: No   Attends Music therapist: Never   Marital Status: Married     Family History: The patient's family history includes Breast cancer in her sister.  ROS:   Please see the history of present illness.     All other systems reviewed and are negative.  EKGs/Labs/Other Studies Reviewed:    The following studies were reviewed today:  Echocardiogram 06/10/2020  1. Left ventricular ejection fraction, by estimation, is 65 to 70%. The  left ventricle has normal function. The left ventricle has no regional  wall motion abnormalities. There is mild left ventricular hypertrophy.  Left ventricular diastolic parameters  are consistent with Grade II diastolic dysfunction (pseudonormalization).   2. Right ventricular systolic function is normal. The right ventricular  size is normal. There is moderately elevated pulmonary artery systolic  pressure. The estimated right ventricular systolic pressure is 13.2 mmHg.   3. Left atrial size was mildly dilated.   4. The mitral valve is degenerative. No evidence of mitral valve  regurgitation. Mild mitral stenosis.   5. The aortic valve is tricuspid. Aortic valve regurgitation is not  visualized.   6. The inferior vena cava is normal in size with greater than 50%  respiratory variability, suggesting right atrial pressure of 3 mmHg.    MPI 10/08/2013 Pharmacological myocardial perfusion study without significant ischemia No significant wall motion abnormality noted EF 82% LV SF normal Overall low risk scan    EKG:  EKG is  ordered today.  The ekg ordered today demonstrates NSR, 63bpm, TWI aVL, no change  Recent Labs: 06/09/2020: B  Natriuretic Peptide 185.9; TSH 3.538 06/23/2020: Magnesium 2.1 07/09/2020: ALT 17 08/16/2020: BUN 22; Creatinine, Ser 1.04; Hemoglobin 13.9; Platelets 232; Potassium 3.5; Sodium 140  Recent Lipid Panel    Component Value Date/Time   CHOL 206 (H) 01/09/2020 1026   CHOL 280 (H) 10/08/2013 0516   TRIG 86 01/09/2020 1026   TRIG 112 10/08/2013 0516   HDL 93 01/09/2020 1026   HDL 106 (H) 10/08/2013 0516   CHOLHDL 2.2 01/09/2020 1026   VLDL 22 10/08/2013 0516   LDLCALC 98 01/09/2020 1026   LDLCALC 152 (H) 10/08/2013 0516    Physical Exam:    VS:  BP 124/76 (BP Location: Left Arm, Patient Position: Sitting, Cuff Size: Normal)   Pulse 63   Ht 5\' 3"  (1.6 m)   Wt 119 lb (54 kg)   SpO2 97%   BMI 21.08 kg/m     Wt Readings from Last 3 Encounters:  12/02/20 119 lb (54 kg)  11/25/20 121 lb (54.9 kg)  11/23/20 119 lb 3.2 oz (54.1 kg)     GEN:  Well nourished, well developed in no acute distress HEENT: Normal NECK: No JVD; No carotid bruits LYMPHATICS: No lymphadenopathy CARDIAC: RRR, no murmurs, rubs, gallops RESPIRATORY:  Clear to auscultation without rales, wheezing or rhonchi  ABDOMEN: Soft, non-tender, non-distended MUSCULOSKELETAL:  No edema; No deformity  SKIN: Warm and dry NEUROLOGIC:  Alert and oriented x 3 PSYCHIATRIC:  Normal affect   ASSESSMENT:    1. Paroxysmal A-fib ([place])   2. Essential hypertension   3. Chronic diastolic heart failure ([place])   4. Nausea    PLAN:    In order of problems listed above:  Paroxysmal Afib Patient is tolerating Pradaxa well, it is not worsening GI issues. EKG today shows NSR. Continue a/c for CHADSVASC of at least 6 (CHF, HTN, agex2, PAD, female). PCP will check CBC next month. Continue rate control with Lopressor.   HTN BP good today. Continue HCTZ 12.5mg  daily and Lopressor 12.5mg  BID.   HFpEF She is euvolemic on exam. Continue HCTZ and BB.   GI issues Has ongoing nasuea following with GI, says it is mildly improved. They are  going to get a second opinion in December.   Disposition: Follow up in 3 month(s) with MD/APP    Signed, Sonnia Strong H  Jorene Minors  12/02/2020 3:26 PM    [facility]

## 2020-12-01 NOTE — Telephone Encounter (Signed)
I called and spoke with  Butch Penny at Dr. Ricky Stabs office and she confirmed that they didn't have the referral. I have refaxed the referral and notes today

## 2020-12-01 NOTE — Therapy (Signed)
[facility] [address] [place], [place], [address] Phone: [phone]   Fax:  [phone]  Physical Therapy Treatment  Patient Details  Name: [name] MRN: [id number] Date of Birth: [birth year] Referring Provider (PT): Halina Maidens   Encounter Date: 12/01/2020   PT End of Session - 12/01/20 1208     Visit Number 4    Number of Visits 12    Date for PT Re-Evaluation 12/29/20    Authorization Type IE: 11/17/2020    PT Start Time 1200    PT Stop Time 1240    PT Time Calculation (min) 40 min    Equipment Utilized During Treatment Gait belt    Activity Tolerance Patient tolerated treatment well    Behavior During Therapy [facility] for tasks assessed/performed             Past Medical History:  Diagnosis Date   Acute metabolic encephalopathy 44/05/4740   Allergy    Bronchiectasis ([place])    Cancer ([place])    left breast cancer    COPD (chronic obstructive pulmonary disease) ([place])    GERD (gastroesophageal reflux disease)    Hyperlipemia    Hypertension    Intractable nausea and vomiting 06/20/2018    Past Surgical History:  Procedure Laterality Date   BREAST LUMPECTOMY Left 2008   CHOLECYSTECTOMY     HIP PINNING,CANNULATED Left 06/05/2020   Procedure: CANNULATED HIP PINNING;  Surgeon: Hessie Knows, MD;  Location: [facility] ORS;  Service: Orthopedics;  Laterality: Left;   PARTIAL HYSTERECTOMY     TEE WITHOUT CARDIOVERSION N/A 06/10/2020   Procedure: TRANSESOPHAGEAL ECHOCARDIOGRAM (TEE);  Surgeon: Kate Sable, MD;  Location: [facility] ORS;  Service: Cardiovascular;  Laterality: N/A;    There were no vitals filed for this visit.   Subjective Assessment - 12/01/20 1205     Subjective Patient woke up with nausea this morning but feels a bit better now. Patient able to perform HEP exercises over the weekend and went for a short walk. The patient states tiredness and nausea go hand-in-hand and she is unable to continue walking.     Currently in Pain? No/denies             TREATMENT:  Neuromuscular Re-education  NuStep with use of UE/LE, Level 1 with a seated rest break at 5 mins for 2 mins due to increased fatigue. Patient was monitored and cued throughout.   Weaving in and out of cones, x2 with CGA and no AD for improved balance and gait  Gait up/down the hallway x2 with CGA and no AD for improved step length/arm swing to ensure proper energy conservation STS on raised surface (airex) with single UE support of handrail, x10, CGA for safety and balance, VCs and TCs as needed for a controlled descent  STS on raised surface (airex) with no use of UE support, x10, CGA for safety and balance, VCs and TCs as needed    Step taps on 6" step with 1.5 lbs ankle weights, 2x10 with BUE support, CGA for safety and balance  Step taps on 6" step with 1.5 lbs ankle weights, 2x10 with unilateral UE support, CGA for safety and balance     Patient educated throughout session on appropriate technique and form using multi-modal cueing, HEP, and activity modification. Patient articulated understanding and returned demonstration.   Patient Response to interventions: Patient with increased fatigue by the end of the session and some soreness in B quadriceps mm.  ASSESSMENT Patient presents to clinic with excellent motivation to participate in today's session. Patient demonstrates deficits in BLE strength, LLE muscle tightness, L hip ROM, posture, balance and gait. Patient with improved endurance throughout session but did require occasional rest breaks as LE muscles fatigued. Patient demonstrated decreased step length B and arm swing B during dynamic balance interventions and gait. Patient educated on typical gait mechanics and with basic understanding of energy conservation during gait. Patient performed balance activities, postural control interventions, and tolerated increased challenges with CGA and minimal cueing. Patient  responded well to active and educational interventions and will benefit from continued skilled therapeutic intervention to address deficits in BLE strength, ROM, posture, balance and gait in order to decrease risk of fall, increase function, and improve overall QOL.            PT Long Term Goals - 11/17/20 1614       PT LONG TERM GOAL #1   Title Patient will demonstrate improved function as evidenced by a score of 55 on FOTO measure for full participation in activities at home and in the community without limitation.    Baseline IE: 33    Time 6    Period Weeks    Status New    Target Date 12/29/20      PT LONG TERM GOAL #2   Title Patient will demonstrate a 3 seconds improvement with 5TSTS with supervision, minimal use of BUE, and LRAD in order to reduce risk of falls and improve transfers.    Baseline IE: 22.26 secs    Time 6    Period Weeks    Status New    Target Date 12/29/20      PT LONG TERM GOAL #3   Title Patient will be able to demonstrate increased B hip strength in flexion and abduction, >/=4/5 MMT, in order to participate in activities in the home and in the community without limitation.    Baseline IE: B LE 3/5 in flex/abd    Time 6    Period Weeks    Status New    Target Date 12/29/20      PT LONG TERM GOAL #4   Title Patient will report moderate difficulty or less with usual activities around the house and lifting an object from the floor in order to dmeonstrate an improvement in function and in preparation for return to PLOF and participation with community activties including but not limited to golf.    Baseline IE: "quite a bit of difficulty"    Time 6    Period Weeks    Status New    Target Date 12/29/20                   Plan - 12/01/20 1208     Clinical Impression Statement Patient presents to clinic with excellent motivation to participate in today's session. Patient demonstrates deficits in BLE strength, LLE muscle tightness, L hip  ROM, posture, balance and gait. Patient with improved endurance throughout session but did require occasional rest breaks as LE muscles fatigued. Patient demonstrated decreased step length B and arm swing B during dynamic balance interventions and gait. Patient educated on typical gait mechanics and with basic understanding of energy conservation during gait. Patient performed balance activities, postural control interventions, and tolerated increased challenges with CGA and minimal cueing. Patient responded well to active and educational interventions and will benefit from continued skilled therapeutic intervention to address deficits in BLE strength, ROM, posture, balance and  gait in order to decrease risk of fall, increase function, and improve overall QOL.    Personal Factors and Comorbidities Age;Comorbidity 3+;Past/Current Experience;Time since onset of injury/illness/exacerbation    Comorbidities A-fib, acquired thrombophilia, A-fib, COPD Stage 2, GERD, HTN, hyperlipidemia, hx of breast cancer on L    Examination-Activity Limitations Transfers;Bed Mobility;Bend;Squat;Stairs;Locomotion Level;Stand    Examination-Participation Restrictions Interpersonal Relationship;Laundry;Community Activity;Shop    Stability/Clinical Decision Making Evolving/Moderate complexity    Rehab Potential Fair    PT Frequency 2x / week    PT Duration 6 weeks    PT Treatment/Interventions ADLs/Self Care Home Management;Aquatic Therapy;Cryotherapy;Iontophoresis 4mg /ml Dexamethasone;Moist Heat;Gait training;Stair training;Functional mobility training;Therapeutic activities;Therapeutic exercise;Neuromuscular re-education;Patient/family education;Orthotic Fit/Training;Manual techniques;Passive range of motion;Taping;Joint Manipulations;Spinal Manipulations;Balance training;Energy conservation;Dry needling    PT Next Visit Plan LE strengthening, walking endurance, balance challenges    PT Home Exercise Plan --    Consulted and  Agree with Plan of Care Patient;Family member/caregiver    Family Member Consulted Husband             Patient will benefit from skilled therapeutic intervention in order to improve the following deficits and impairments:  Abnormal gait, Decreased activity tolerance, Decreased balance, Decreased mobility, Decreased range of motion, Decreased strength, Difficulty walking, Hypomobility, Postural dysfunction, Improper body mechanics, Pain, Decreased endurance  Visit Diagnosis: Pain in left hip  Muscle weakness (generalized)  Difficulty in walking, not elsewhere classified  Abnormal posture     Problem List Patient Active Problem List   Diagnosis Date Noted   Closed intracapsular fracture of left femur with routine healing 08/28/2020   Acquired thrombophilia ([place]) 07/15/2020   AF (paroxysmal atrial fibrillation) ([place]) 06/19/2020   Atrial fibrillation with rapid ventricular response ([place]) 06/09/2020   Fracture of femoral neck, left, closed ([place]) 06/05/2020   Atherosclerosis of aorta ([place]) 01/09/2020   Stage 2 moderate COPD by GOLD classification ([place]) 01/09/2020   HSV (herpes simplex virus) infection 01/09/2020   BCC (basal cell carcinoma), face 10/12/2017   Environmental and seasonal allergies 10/30/2014   Psoriasis vulgaris 10/30/2014   Essential (primary) hypertension 10/30/2014   Gastro-esophageal reflux disease without esophagitis 10/30/2014   Hot flash, menopausal 10/30/2014   Hyperlipidemia, mixed 10/30/2014   History of cancer of left breast 10/30/2014   Bronchiectasis ([place]) 09/24/2013   Danniel Tones, SPT  This entire session was performed under direct supervision and direction of a licensed Chiropractor . I have personally read, edited and approve of the note as written.  Myles Gip PT, DPT [phone]  12/01/2020, 1:31 PM  [facility] [address] [place], [place], [address] Phone: [phone]    Fax:  [phone]  Name: [name] MRN: [id number] Date of Birth: [birth year]

## 2020-12-01 NOTE — Telephone Encounter (Signed)
Spoke to patient's spouse, Tyson(DPR). Elonda Husky stated that Dr. Ricky Stabs office does not have a referral from our office.  Referral was placed on 11/23/2020.  Rodena Piety, can you help with this? Thanks

## 2020-12-02 ENCOUNTER — Ambulatory Visit: Payer: Medicare PPO | Admitting: Medical

## 2020-12-02 ENCOUNTER — Encounter: Payer: Self-pay | Admitting: Medical

## 2020-12-02 VITALS — BP 124/76 | HR 63 | Ht 63.0 in | Wt 119.0 lb

## 2020-12-02 DIAGNOSIS — I480 Paroxysmal atrial fibrillation: Secondary | ICD-10-CM

## 2020-12-02 DIAGNOSIS — I5032 Chronic diastolic (congestive) heart failure: Secondary | ICD-10-CM | POA: Diagnosis not present

## 2020-12-02 DIAGNOSIS — I10 Essential (primary) hypertension: Secondary | ICD-10-CM

## 2020-12-02 DIAGNOSIS — R110 Nausea: Secondary | ICD-10-CM | POA: Diagnosis not present

## 2020-12-02 NOTE — Patient Instructions (Signed)
Medication Instructions:  Your physician recommends that you continue on your current medications as directed. Please refer to the Current Medication list given to you today.  *If you need a refill on your cardiac medications before your next appointment, please call your pharmacy*   Lab Work: None ordered If you have labs (blood work) drawn today and your tests are completely normal, you will receive your results only by: [place] (if you have MyChart) OR A paper copy in the mail If you have any lab test that is abnormal or we need to change your treatment, we will call you to review the results.   Testing/Procedures: None ordered   Follow-Up: At [facility], you and your health needs are our priority.  As part of our continuing mission to provide you with exceptional heart care, we have created designated Provider Care Teams.  These Care Teams include your primary Cardiologist (physician) and Advanced Practice Providers (APPs -  Physician Assistants and Nurse Practitioners) who all work together to provide you with the care you need, when you need it.  We recommend signing up for the patient portal called "MyChart".  Sign up information is provided on this After Visit Summary.  MyChart is used to connect with patients for Virtual Visits (Telemedicine).  Patients are able to view lab/test results, encounter notes, upcoming appointments, etc.  Non-urgent messages can be sent to your provider as well.   To learn more about what you can do with MyChart, go to [email or web address].    Your next appointment:   3 month(s)  The format for your next appointment:   In Person  Provider:   You may see Nelva Bush, MD or one of the following Advanced Practice Providers on your designated Care Team:   Murray Hodgkins, NP Christell Faith, PA-C Marrianne Mood, PA-C Cadence Kathlen Mody, [place]   Other Instructions N/A

## 2020-12-03 ENCOUNTER — Other Ambulatory Visit: Payer: Self-pay

## 2020-12-03 ENCOUNTER — Encounter: Payer: Self-pay | Admitting: Physical Therapy

## 2020-12-03 ENCOUNTER — Ambulatory Visit: Payer: Medicare PPO | Admitting: Physical Therapy

## 2020-12-03 DIAGNOSIS — R262 Difficulty in walking, not elsewhere classified: Secondary | ICD-10-CM | POA: Diagnosis not present

## 2020-12-03 DIAGNOSIS — M25552 Pain in left hip: Secondary | ICD-10-CM | POA: Diagnosis not present

## 2020-12-03 DIAGNOSIS — R293 Abnormal posture: Secondary | ICD-10-CM | POA: Diagnosis not present

## 2020-12-03 DIAGNOSIS — M6281 Muscle weakness (generalized): Secondary | ICD-10-CM

## 2020-12-03 NOTE — Therapy (Signed)
[facility] [address] [place], [place], [address] Phone: [phone]   Fax:  [phone]  Physical Therapy Treatment  Patient Details  Name: [name] MRN: [id number] Date of Birth: 08-23-1933 Referring Provider (PT): Halina Maidens   Encounter Date: 12/03/2020   PT End of Session - 12/03/20 1441     Visit Number 5    Number of Visits 12    Date for PT Re-Evaluation 12/29/20    Authorization Type IE: 11/17/2020    PT Start Time 1200    PT Stop Time 1240    PT Time Calculation (min) 40 min    Equipment Utilized During Treatment Gait belt    Activity Tolerance Patient tolerated treatment well    Behavior During Therapy [facility] for tasks assessed/performed             Past Medical History:  Diagnosis Date   Acute metabolic encephalopathy 13/24/4010   Allergy    Bronchiectasis ([place])    Cancer ([place])    left breast cancer    COPD (chronic obstructive pulmonary disease) ([place])    GERD (gastroesophageal reflux disease)    Hyperlipemia    Hypertension    Intractable nausea and vomiting 06/20/2018    Past Surgical History:  Procedure Laterality Date   BREAST LUMPECTOMY Left 2008   CHOLECYSTECTOMY     HIP PINNING,CANNULATED Left 06/05/2020   Procedure: CANNULATED HIP PINNING;  Surgeon: Hessie Knows, MD;  Location: [facility] ORS;  Service: Orthopedics;  Laterality: Left;   PARTIAL HYSTERECTOMY     TEE WITHOUT CARDIOVERSION N/A 06/10/2020   Procedure: TRANSESOPHAGEAL ECHOCARDIOGRAM (TEE);  Surgeon: Kate Sable, MD;  Location: [facility] ORS;  Service: Cardiovascular;  Laterality: N/A;    There were no vitals filed for this visit.   Subjective Assessment - 12/03/20 1438     Subjective Patient reports that she is doing okay today but does feel her legs aren't working properly and that she is feeling more fatigued. Notes nausea is present but not limiting for today's session. States that she was able to get scheduled with a new  GI provider for evaluation of nausea (appointment in December).    Patient is accompained by: Family member    Currently in Pain? No/denies            TREATMENT  Therapeutic Exercise: B LAQ, x12 with eccentric lowering  B seated hip abduction, RTB, x30  STS from elevated surface (1 airex on seat) x12 STS w/ SUE support x6 each High box squat (stand to hover) TRX, x12 High box squat split stance (stand to hover) TRX, x6 each Nu Step, seat 6, level 2-5 varied throughout to mimic demands of incline walking, SPM 30-50   Patient educated throughout session on appropriate technique and form using multi-modal cueing, HEP, and activity modification. Patient articulated understanding and returned demonstration.  Patient Response to interventions: Endorses fatigue in the legs  ASSESSMENT Patient presents to clinic with excellent motivation to participate in therapy. Patient demonstrates deficits in BLE strength, activity tolerance, energy conservation, and balance. Patient able to achieve BLE strengthening focused sequence during today's session and responded positively to active interventions with reported BLE fatigue from effort reported. Patient will benefit from continued skilled therapeutic intervention to address remaining deficits in BLE strength, activity tolerance, energy conservation, and balance in order to decrease risk of falls, increase function, and improve overall QOL.     PT Long Term Goals - 11/17/20 1614  PT LONG TERM GOAL #1   Title Patient will demonstrate improved function as evidenced by a score of 55 on FOTO measure for full participation in activities at home and in the community without limitation.    Baseline IE: 33    Time 6    Period Weeks    Status New    Target Date 12/29/20      PT LONG TERM GOAL #2   Title Patient will demonstrate a 3 seconds improvement with 5TSTS with supervision, minimal use of BUE, and LRAD in order to reduce risk of falls  and improve transfers.    Baseline IE: 22.26 secs    Time 6    Period Weeks    Status New    Target Date 12/29/20      PT LONG TERM GOAL #3   Title Patient will be able to demonstrate increased B hip strength in flexion and abduction, >/=4/5 MMT, in order to participate in activities in the home and in the community without limitation.    Baseline IE: B LE 3/5 in flex/abd    Time 6    Period Weeks    Status New    Target Date 12/29/20      PT LONG TERM GOAL #4   Title Patient will report moderate difficulty or less with usual activities around the house and lifting an object from the floor in order to dmeonstrate an improvement in function and in preparation for return to PLOF and participation with community activties including but not limited to golf.    Baseline IE: "quite a bit of difficulty"    Time 6    Period Weeks    Status New    Target Date 12/29/20                   Plan - 12/03/20 1441     Clinical Impression Statement Patient presents to clinic with excellent motivation to participate in therapy. Patient demonstrates deficits in BLE strength, activity tolerance, energy conservation, and balance. Patient able to achieve BLE strengthening focused sequence during today's session and responded positively to active interventions with reported BLE fatigue from effort reported. Patient will benefit from continued skilled therapeutic intervention to address remaining deficits in BLE strength, activity tolerance, energy conservation, and balance in order to decrease risk of falls, increase function, and improve overall QOL.    Personal Factors and Comorbidities Age;Comorbidity 3+;Past/Current Experience;Time since onset of injury/illness/exacerbation    Comorbidities A-fib, acquired thrombophilia, A-fib, COPD Stage 2, GERD, HTN, hyperlipidemia, hx of breast cancer on L    Examination-Activity Limitations Transfers;Bed Mobility;Bend;Squat;Stairs;Locomotion Level;Stand     Examination-Participation Restrictions Interpersonal Relationship;Laundry;Community Activity;Shop    Stability/Clinical Decision Making Evolving/Moderate complexity    Rehab Potential Fair    PT Frequency 2x / week    PT Duration 6 weeks    PT Treatment/Interventions ADLs/Self Care Home Management;Aquatic Therapy;Cryotherapy;Iontophoresis 4mg /ml Dexamethasone;Moist Heat;Gait training;Stair training;Functional mobility training;Therapeutic activities;Therapeutic exercise;Neuromuscular re-education;Patient/family education;Orthotic Fit/Training;Manual techniques;Passive range of motion;Taping;Joint Manipulations;Spinal Manipulations;Balance training;Energy conservation;Dry needling    PT Next Visit Plan LE strengthening, walking endurance, balance challenges    Consulted and Agree with Plan of Care Patient;Family member/caregiver    Family Member Consulted Husband             Patient will benefit from skilled therapeutic intervention in order to improve the following deficits and impairments:  Abnormal gait, Decreased activity tolerance, Decreased balance, Decreased mobility, Decreased range of motion, Decreased strength, Difficulty walking, Hypomobility, Postural dysfunction, Improper  body mechanics, Pain, Decreased endurance  Visit Diagnosis: Pain in left hip  Muscle weakness (generalized)  Difficulty in walking, not elsewhere classified     Problem List Patient Active Problem List   Diagnosis Date Noted   Closed intracapsular fracture of left femur with routine healing 08/28/2020   Acquired thrombophilia ([place]) 07/15/2020   AF (paroxysmal atrial fibrillation) ([place]) 06/19/2020   Atrial fibrillation with rapid ventricular response ([place]) 06/09/2020   Fracture of femoral neck, left, closed ([place]) 06/05/2020   Atherosclerosis of aorta ([place]) 01/09/2020   Stage 2 moderate COPD by GOLD classification ([place]) 01/09/2020   HSV (herpes simplex virus) infection 01/09/2020   BCC (basal cell  carcinoma), face 10/12/2017   Environmental and seasonal allergies 10/30/2014   Psoriasis vulgaris 10/30/2014   Essential (primary) hypertension 10/30/2014   Gastro-esophageal reflux disease without esophagitis 10/30/2014   Hot flash, menopausal 10/30/2014   Hyperlipidemia, mixed 10/30/2014   History of cancer of left breast 10/30/2014   Bronchiectasis ([place]) 09/24/2013    Myles Gip PT, DPT [phone]  12/03/2020, 2:47 PM  [place] [address] [place], [place], [address] Phone: [phone]   Fax:  [phone]  Name: [name] MRN: [id number] Date of Birth: 29-Nov-1933

## 2020-12-07 DIAGNOSIS — S72002A Fracture of unspecified part of neck of left femur, initial encounter for closed fracture: Secondary | ICD-10-CM | POA: Diagnosis not present

## 2020-12-08 ENCOUNTER — Ambulatory Visit: Payer: Medicare PPO | Attending: Internal Medicine | Admitting: Physical Therapy

## 2020-12-08 ENCOUNTER — Encounter: Payer: Self-pay | Admitting: Physical Therapy

## 2020-12-08 ENCOUNTER — Other Ambulatory Visit: Payer: Self-pay

## 2020-12-08 DIAGNOSIS — R262 Difficulty in walking, not elsewhere classified: Secondary | ICD-10-CM

## 2020-12-08 DIAGNOSIS — R293 Abnormal posture: Secondary | ICD-10-CM

## 2020-12-08 DIAGNOSIS — M25552 Pain in left hip: Secondary | ICD-10-CM | POA: Diagnosis not present

## 2020-12-08 DIAGNOSIS — M6281 Muscle weakness (generalized): Secondary | ICD-10-CM | POA: Diagnosis not present

## 2020-12-08 NOTE — Therapy (Signed)
[place] [facility] [address] [place], [place], [address] Phone: [phone]   Fax:  [phone]  Physical Therapy Treatment  Patient Details  Name: [name] MRN: [id number] Date of Birth: [birth year] Referring Provider (PT): Halina Maidens   Encounter Date: 12/08/2020   PT End of Session - 12/08/20 1158     Visit Number 6    Number of Visits 12    Date for PT Re-Evaluation 12/29/20    Authorization Type IE: 11/17/2020    PT Start Time 1200    PT Stop Time 1240    PT Time Calculation (min) 40 min    Equipment Utilized During Treatment Gait belt    Activity Tolerance Patient tolerated treatment well    Behavior During Therapy [facility] for tasks assessed/performed             Past Medical History:  Diagnosis Date   Acute metabolic encephalopathy 93/79/0240   Allergy    Bronchiectasis ([place])    Cancer ([place])    left breast cancer    COPD (chronic obstructive pulmonary disease) ([place])    GERD (gastroesophageal reflux disease)    Hyperlipemia    Hypertension    Intractable nausea and vomiting 06/20/2018    Past Surgical History:  Procedure Laterality Date   BREAST LUMPECTOMY Left 2008   CHOLECYSTECTOMY     HIP PINNING,CANNULATED Left 06/05/2020   Procedure: CANNULATED HIP PINNING;  Surgeon: Hessie Knows, MD;  Location: [facility] ORS;  Service: Orthopedics;  Laterality: Left;   PARTIAL HYSTERECTOMY     TEE WITHOUT CARDIOVERSION N/A 06/10/2020   Procedure: TRANSESOPHAGEAL ECHOCARDIOGRAM (TEE);  Surgeon: Kate Sable, MD;  Location: [facility] ORS;  Service: Cardiovascular;  Laterality: N/A;    There were no vitals filed for this visit.   Subjective Assessment - 12/08/20 1158     Subjective Patient reports feeling well this morning with no nausea. "Today is a good day." Patient reports performing at least a mile on her recumbent bike at home with no problem. Patient has some increased pain in the R knee but it is more of an ache.  Patient saw the surgeon who completed the pinning of the L hip and commented that her gait "looked great."    Patient is accompained by: Family member    Currently in Pain? Yes    Pain Score 1     Pain Location Knee    Pain Orientation Right             TREATMENT  Therapeutic Exercise: Nu Step x12 mins with B UE/LE use for improved aerobic conditioning, Level 2-5 varied throughout to mimic demands of incline walking, SPM 30-52  High box squat (stand to hover) TRX, x12 High box squat split stance (stand to hover) TRX, x12 each  Gait in hallway, x132 ft with CGA and no AD, VCs for improved B arm swing during gait   Neuromuscular Re-education Standing hip flexion/abduction/extension, Bx12, 2lbs ankle weights, for LE strengthening and postural control   Lateral sidestepping, x2, with VCs to maintain upright posture and improve LE strengthening for stability during gait, CGA and no AD   One overt LOB during second bout but patient initiated a step response and recovered with CGA  Patient educated throughout session on appropriate technique and form using multi-modal cueing, HEP, and activity modification. Patient articulated understanding and returned demonstration.  Patient Response to interventions: After the split stance box squat patient stated feeling winded. Patient given a seated rest  break in between activities.   ASSESSMENT Patient presents to clinic with excellent motivation to participate in therapy. Patient demonstrates deficits in BLE strength, activity tolerance, energy conservation, posture and balance. Patient performed BLE strengthening with various active interventions and at most CGA for safety and balance. Patient with one LOB during sidestepping and denies dizziness but demonstrated a step strategy and continued with intervention. Patient able to achieve basic understanding of typical movement during gait and responded positively to active and educational  interventions with reported BLE fatigue at the end of the session. Patient will benefit from continued skilled therapeutic intervention to address remaining deficits in BLE strength, activity tolerance, energy conservation, and balance in order to decrease risk of falls, increase function, and improve overall QOL.        PT Long Term Goals - 11/17/20 1614       PT LONG TERM GOAL #1   Title Patient will demonstrate improved function as evidenced by a score of 55 on FOTO measure for full participation in activities at home and in the community without limitation.    Baseline IE: 33    Time 6    Period Weeks    Status New    Target Date 12/29/20      PT LONG TERM GOAL #2   Title Patient will demonstrate a 3 seconds improvement with 5TSTS with supervision, minimal use of BUE, and LRAD in order to reduce risk of falls and improve transfers.    Baseline IE: 22.26 secs    Time 6    Period Weeks    Status New    Target Date 12/29/20      PT LONG TERM GOAL #3   Title Patient will be able to demonstrate increased B hip strength in flexion and abduction, >/=4/5 MMT, in order to participate in activities in the home and in the community without limitation.    Baseline IE: B LE 3/5 in flex/abd    Time 6    Period Weeks    Status New    Target Date 12/29/20      PT LONG TERM GOAL #4   Title Patient will report moderate difficulty or less with usual activities around the house and lifting an object from the floor in order to dmeonstrate an improvement in function and in preparation for return to PLOF and participation with community activties including but not limited to golf.    Baseline IE: "quite a bit of difficulty"    Time 6    Period Weeks    Status New    Target Date 12/29/20                   Plan - 12/08/20 1158     Clinical Impression Statement Patient presents to clinic with excellent motivation to participate in therapy. Patient demonstrates deficits in BLE  strength, activity tolerance, energy conservation, posture and balance. Patient performed BLE strengthening with various active interventions and at most CGA for safety and balance. Patient with one LOB during sidestepping and denies dizziness but demonstrated a step strategy and continued with intervention. Patient able to achieve basic understanding of typical movement during gait and responded positively to active and educational interventions with reported BLE fatigue at the end of the session. Patient will benefit from continued skilled therapeutic intervention to address remaining deficits in BLE strength, activity tolerance, energy conservation, and balance in order to decrease risk of falls, increase function, and improve overall QOL.    Personal  Factors and Comorbidities Age;Comorbidity 3+;Past/Current Experience;Time since onset of injury/illness/exacerbation    Comorbidities A-fib, acquired thrombophilia, A-fib, COPD Stage 2, GERD, HTN, hyperlipidemia, hx of breast cancer on L    Examination-Activity Limitations Transfers;Bed Mobility;Bend;Squat;Stairs;Locomotion Level;Stand    Examination-Participation Restrictions Interpersonal Relationship;Laundry;Community Activity;Shop    Stability/Clinical Decision Making Evolving/Moderate complexity    Rehab Potential Fair    PT Frequency 2x / week    PT Duration 6 weeks    PT Treatment/Interventions ADLs/Self Care Home Management;Aquatic Therapy;Cryotherapy;Iontophoresis 4mg /ml Dexamethasone;Moist Heat;Gait training;Stair training;Functional mobility training;Therapeutic activities;Therapeutic exercise;Neuromuscular re-education;Patient/family education;Orthotic Fit/Training;Manual techniques;Passive range of motion;Taping;Joint Manipulations;Spinal Manipulations;Balance training;Energy conservation;Dry needling    PT Next Visit Plan walking endurance, LE strength, balance challenges    Consulted and Agree with Plan of Care Patient;Family  member/caregiver    Family Member Consulted Husband             Patient will benefit from skilled therapeutic intervention in order to improve the following deficits and impairments:  Abnormal gait, Decreased activity tolerance, Decreased balance, Decreased mobility, Decreased range of motion, Decreased strength, Difficulty walking, Hypomobility, Postural dysfunction, Improper body mechanics, Pain, Decreased endurance  Visit Diagnosis: Muscle weakness (generalized)  Difficulty in walking, not elsewhere classified  Abnormal posture  Pain in left hip     Problem List Patient Active Problem List   Diagnosis Date Noted   Closed intracapsular fracture of left femur with routine healing 08/28/2020   Acquired thrombophilia ([place]) 07/15/2020   AF (paroxysmal atrial fibrillation) ([place]) 06/19/2020   Atrial fibrillation with rapid ventricular response ([place]) 06/09/2020   Fracture of femoral neck, left, closed ([place]) 06/05/2020   Atherosclerosis of aorta ([place]) 01/09/2020   Stage 2 moderate COPD by GOLD classification ([place]) 01/09/2020   HSV (herpes simplex virus) infection 01/09/2020   BCC (basal cell carcinoma), face 10/12/2017   Environmental and seasonal allergies 10/30/2014   Psoriasis vulgaris 10/30/2014   Essential (primary) hypertension 10/30/2014   Gastro-esophageal reflux disease without esophagitis 10/30/2014   Hot flash, menopausal 10/30/2014   Hyperlipidemia, mixed 10/30/2014   History of cancer of left breast 10/30/2014   Bronchiectasis ([place]) 09/24/2013    Porschea Borys, SPT  This entire session was performed under direct supervision and direction of a licensed Chiropractor . I have personally read, edited and approve of the note as written.  Myles Gip PT, DPT [phone]  12/08/2020, 1:39 PM   [facility] [address] [place], [place], [address] Phone: [phone]   Fax:  [phone]  Name: NIKKIE LIMING MRN: [id number] Date of Birth: 18-May-1933

## 2020-12-09 DIAGNOSIS — R110 Nausea: Secondary | ICD-10-CM | POA: Diagnosis not present

## 2020-12-09 DIAGNOSIS — I700 Atherosclerosis of aorta: Secondary | ICD-10-CM | POA: Diagnosis not present

## 2020-12-09 DIAGNOSIS — I10 Essential (primary) hypertension: Secondary | ICD-10-CM | POA: Diagnosis not present

## 2020-12-09 DIAGNOSIS — I480 Paroxysmal atrial fibrillation: Secondary | ICD-10-CM | POA: Diagnosis not present

## 2020-12-09 DIAGNOSIS — J449 Chronic obstructive pulmonary disease, unspecified: Secondary | ICD-10-CM | POA: Diagnosis not present

## 2020-12-09 DIAGNOSIS — Z7901 Long term (current) use of anticoagulants: Secondary | ICD-10-CM | POA: Diagnosis not present

## 2020-12-10 ENCOUNTER — Ambulatory Visit: Payer: Medicare PPO | Admitting: Physical Therapy

## 2020-12-10 ENCOUNTER — Other Ambulatory Visit: Payer: Self-pay

## 2020-12-10 ENCOUNTER — Encounter: Payer: Self-pay | Admitting: Physical Therapy

## 2020-12-10 DIAGNOSIS — R262 Difficulty in walking, not elsewhere classified: Secondary | ICD-10-CM

## 2020-12-10 DIAGNOSIS — R293 Abnormal posture: Secondary | ICD-10-CM

## 2020-12-10 DIAGNOSIS — M6281 Muscle weakness (generalized): Secondary | ICD-10-CM

## 2020-12-10 DIAGNOSIS — M25552 Pain in left hip: Secondary | ICD-10-CM

## 2020-12-10 NOTE — Therapy (Signed)
[place] [facility] [address]. [place], [place], [address] Phone: [phone]   Fax:  [phone]  Physical Therapy Treatment  Patient Details  Name: [name] MRN: [id number] Date of Birth: 09-22-1933 Referring Provider (PT): Halina Maidens   Encounter Date: 12/10/2020   PT End of Session - 12/10/20 1159     Visit Number 7    Number of Visits 12    Date for PT Re-Evaluation 12/29/20    Authorization Type IE: 11/17/2020    PT Start Time 1200    PT Stop Time 8502    PT Time Calculation (min) 42 min    Equipment Utilized During Treatment Gait belt    Activity Tolerance Patient tolerated treatment well    Behavior During Therapy [facility] for tasks assessed/performed             Past Medical History:  Diagnosis Date   Acute metabolic encephalopathy 77/41/2878   Allergy    Bronchiectasis ([place])    Cancer ([place])    left breast cancer    COPD (chronic obstructive pulmonary disease) ([place])    GERD (gastroesophageal reflux disease)    Hyperlipemia    Hypertension    Intractable nausea and vomiting 06/20/2018    Past Surgical History:  Procedure Laterality Date   BREAST LUMPECTOMY Left 2008   CHOLECYSTECTOMY     HIP PINNING,CANNULATED Left 06/05/2020   Procedure: CANNULATED HIP PINNING;  Surgeon: Hessie Knows, MD;  Location: [facility] ORS;  Service: Orthopedics;  Laterality: Left;   PARTIAL HYSTERECTOMY     TEE WITHOUT CARDIOVERSION N/A 06/10/2020   Procedure: TRANSESOPHAGEAL ECHOCARDIOGRAM (TEE);  Surgeon: Kate Sable, MD;  Location: [facility] ORS;  Service: Cardiovascular;  Laterality: N/A;    There were no vitals filed for this visit.   Subjective Assessment - 12/10/20 1159     Subjective Patient presenting with some nausea today and feels that it correlates to when she is tired. Patient reports some soreness in the L upper leg.    Patient is accompained by: Family member    Currently in Pain? No/denies              TREATMENT   Pre-treatment assessment:   Iliac crest height: L IC elevated   Therapeutic Exercise:  Gait outside, x2 laps with CGA and no AD, VCs for improved B arm swing during gait increased stride length  5.20 mins. of aerobic exercise with seated rest break   99% O2 stats, 93 bpm   B step ups/step down, x12, with CGA and variation of single UE use and BUE for improved LE strengthening   Seated rest break required  Neuromuscular Re-education Lateral sidestepping, x2, with VCs to maintain upright posture and improve LE strengthening for stability during gait, CGA and no AD              Dynamic balance activities: ball catch and throw with CGA   Even surface, feet together, x12   Even surface, tandem stance B, x12   Airex on feet together, x12   Tandem stance on balance beam, B x30 secs with noticeable ankle strategy but no overt LOB   L heel lift donn/doff in clinic and gait performed   Patient educated throughout session on appropriate technique and form using multi-modal cueing, HEP, and activity modification. Patient articulated understanding and returned demonstration.   Patient Response to interventions: Patient felt better with the heel lift in the L shoe while walking in the clinic.  ASSESSMENT Patient presents to clinic with excellent motivation to participate in therapy. Patient demonstrates deficits in BLE strength, activity tolerance, energy conservation, posture and balance. Patient performed gait on uneven surfaces with occasional incline for 5 mins, with CGA, no AD, and moderate cueing for improved arm swing. Patient demonstrated typical balance strategies during dynamic balance activities with no overt LOB and CGA. Patient able to achieve basic understanding of typical movement during gait and responded positively to active and educational interventions. Patient will benefit from continued skilled therapeutic intervention to address remaining deficits in BLE  strength, activity tolerance, energy conservation, and balance in order to decrease risk of falls, increase function, and improve overall QOL.         PT Long Term Goals - 11/17/20 1614       PT LONG TERM GOAL #1   Title Patient will demonstrate improved function as evidenced by a score of 55 on FOTO measure for full participation in activities at home and in the community without limitation.    Baseline IE: 33    Time 6    Period Weeks    Status New    Target Date 12/29/20      PT LONG TERM GOAL #2   Title Patient will demonstrate a 3 seconds improvement with 5TSTS with supervision, minimal use of BUE, and LRAD in order to reduce risk of falls and improve transfers.    Baseline IE: 22.26 secs    Time 6    Period Weeks    Status New    Target Date 12/29/20      PT LONG TERM GOAL #3   Title Patient will be able to demonstrate increased B hip strength in flexion and abduction, >/=4/5 MMT, in order to participate in activities in the home and in the community without limitation.    Baseline IE: B LE 3/5 in flex/abd    Time 6    Period Weeks    Status New    Target Date 12/29/20      PT LONG TERM GOAL #4   Title Patient will report moderate difficulty or less with usual activities around the house and lifting an object from the floor in order to dmeonstrate an improvement in function and in preparation for return to PLOF and participation with community activties including but not limited to golf.    Baseline IE: "quite a bit of difficulty"    Time 6    Period Weeks    Status New    Target Date 12/29/20                   Plan - 12/10/20 1159     Clinical Impression Statement Patient presents to clinic with excellent motivation to participate in therapy. Patient demonstrates deficits in BLE strength, activity tolerance, energy conservation, posture and balance. Patient performed gait on uneven surfaces with occasional incline for 5 mins, with CGA, no AD, and  moderate cueing for improved arm swing. Patient demonstrated typical balance strategies during dynamic balance activities with no overt LOB and CGA. Patient able to achieve basic understanding of typical movement during gait and responded positively to active and educational interventions. Patient will benefit from continued skilled therapeutic intervention to address remaining deficits in BLE strength, activity tolerance, energy conservation, and balance in order to decrease risk of falls, increase function, and improve overall QOL.    Personal Factors and Comorbidities Age;Comorbidity 3+;Past/Current Experience;Time since onset of injury/illness/exacerbation    Comorbidities A-fib, acquired thrombophilia,  A-fib, COPD Stage 2, GERD, HTN, hyperlipidemia, hx of breast cancer on L    Examination-Activity Limitations Transfers;Bed Mobility;Bend;Squat;Stairs;Locomotion Level;Stand    Examination-Participation Restrictions Interpersonal Relationship;Laundry;Community Activity;Shop    Stability/Clinical Decision Making Evolving/Moderate complexity    Rehab Potential Fair    PT Frequency 2x / week    PT Duration 6 weeks    PT Treatment/Interventions ADLs/Self Care Home Management;Aquatic Therapy;Cryotherapy;Iontophoresis 4mg /ml Dexamethasone;Moist Heat;Gait training;Stair training;Functional mobility training;Therapeutic activities;Therapeutic exercise;Neuromuscular re-education;Patient/family education;Orthotic Fit/Training;Manual techniques;Passive range of motion;Taping;Joint Manipulations;Spinal Manipulations;Balance training;Energy conservation;Dry needling    PT Next Visit Plan walking endurance, LE strength, balance challenges    Consulted and Agree with Plan of Care Patient;Family member/caregiver    Family Member Consulted Husband             Patient will benefit from skilled therapeutic intervention in order to improve the following deficits and impairments:  Abnormal gait, Decreased activity  tolerance, Decreased balance, Decreased mobility, Decreased range of motion, Decreased strength, Difficulty walking, Hypomobility, Postural dysfunction, Improper body mechanics, Pain, Decreased endurance  Visit Diagnosis: Muscle weakness (generalized)  Difficulty in walking, not elsewhere classified  Abnormal posture  Pain in left hip     Problem List Patient Active Problem List   Diagnosis Date Noted   Closed intracapsular fracture of left femur with routine healing 08/28/2020   Acquired thrombophilia ([place]) 07/15/2020   AF (paroxysmal atrial fibrillation) ([place]) 06/19/2020   Atrial fibrillation with rapid ventricular response ([place]) 06/09/2020   Fracture of femoral neck, left, closed ([place]) 06/05/2020   Atherosclerosis of aorta ([place]) 01/09/2020   Stage 2 moderate COPD by GOLD classification ([place]) 01/09/2020   HSV (herpes simplex virus) infection 01/09/2020   BCC (basal cell carcinoma), face 10/12/2017   Environmental and seasonal allergies 10/30/2014   Psoriasis vulgaris 10/30/2014   Essential (primary) hypertension 10/30/2014   Gastro-esophageal reflux disease without esophagitis 10/30/2014   Hot flash, menopausal 10/30/2014   Hyperlipidemia, mixed 10/30/2014   History of cancer of left breast 10/30/2014   Bronchiectasis ([place]) 09/24/2013   Navi Ewton, SPT  This entire session was performed under direct supervision and direction of a licensed Chiropractor . I have personally read, edited and approve of the note as written.  Myles Gip PT, DPT [phone]  12/10/2020, 3:05 PM  [facility] [address] [place], [place], [address] Phone: [phone]   Fax:  [phone]  Name: [name] MRN: [id number] Date of Birth: 20-May-1933

## 2020-12-15 ENCOUNTER — Ambulatory Visit: Payer: Medicare PPO | Admitting: Physical Therapy

## 2020-12-15 ENCOUNTER — Encounter: Payer: Self-pay | Admitting: Physical Therapy

## 2020-12-15 ENCOUNTER — Other Ambulatory Visit: Payer: Self-pay

## 2020-12-15 DIAGNOSIS — M25552 Pain in left hip: Secondary | ICD-10-CM

## 2020-12-15 DIAGNOSIS — R262 Difficulty in walking, not elsewhere classified: Secondary | ICD-10-CM

## 2020-12-15 DIAGNOSIS — R293 Abnormal posture: Secondary | ICD-10-CM | POA: Diagnosis not present

## 2020-12-15 DIAGNOSIS — M6281 Muscle weakness (generalized): Secondary | ICD-10-CM

## 2020-12-15 NOTE — Therapy (Signed)
[facility] [address] [place], [place], [address] Phone: [phone]   Fax:  [phone]  Physical Therapy Treatment  Patient Details  Name: [name] MRN: [id number] Date of Birth: [birth year] Referring Provider (PT): Halina Maidens   Encounter Date: 12/15/2020   PT End of Session - 12/15/20 1211     Visit Number 8    Number of Visits 12    Date for PT Re-Evaluation 12/29/20    Authorization Type IE: 11/17/2020    PT Start Time 1204    PT Stop Time 1244    PT Time Calculation (min) 40 min    Equipment Utilized During Treatment Gait belt    Activity Tolerance Patient tolerated treatment well    Behavior During Therapy [facility] for tasks assessed/performed             Past Medical History:  Diagnosis Date   Acute metabolic encephalopathy 26/71/2458   Allergy    Bronchiectasis ([place])    Cancer ([place])    left breast cancer    COPD (chronic obstructive pulmonary disease) ([place])    GERD (gastroesophageal reflux disease)    Hyperlipemia    Hypertension    Intractable nausea and vomiting 06/20/2018    Past Surgical History:  Procedure Laterality Date   BREAST LUMPECTOMY Left 2008   CHOLECYSTECTOMY     HIP PINNING,CANNULATED Left 06/05/2020   Procedure: CANNULATED HIP PINNING;  Surgeon: Hessie Knows, MD;  Location: [facility] ORS;  Service: Orthopedics;  Laterality: Left;   PARTIAL HYSTERECTOMY     TEE WITHOUT CARDIOVERSION N/A 06/10/2020   Procedure: TRANSESOPHAGEAL ECHOCARDIOGRAM (TEE);  Surgeon: Kate Sable, MD;  Location: [facility] ORS;  Service: Cardiovascular;  Laterality: N/A;    There were no vitals filed for this visit.   Subjective Assessment - 12/15/20 1209     Subjective Patient presents with increased nausea today especially when she first woke up this morning. Patient denies any pain or soreness. Patient has worn heel lift in the L shoe and has no complaints. Patient has continued to walk and husband stated  that they haven't been doing the hills in the community lately but they did a few days ago. Patient noticed increased fatigue while trying to peform uphill walking.    Patient is accompained by: Family member    Currently in Pain? No/denies            TREATMENT  Therapeutic Exercise:  Nu Step x12 mins with B UE/LE use for improved aerobic conditioning, Level 2-6 varied throughout to mimic demands of incline walking, SPM 30-50  High box squat (stand to hover) TRX, 2x10, CGA  High box squat split stance (stand to hover) TRX, L LE back to increase weightbearing, 1x10, CGA  Standing heel/toe raises with CGA and use of single UE, x15 for improved LE strengthening  Standing heel/toe raises with CGA and use of single UE, L LE back to increase weightbearing, x15   Neuromuscular Re-education Lateral sidestepping, x2, with RTB around knees, VCs to maintain upright posture and improve LE strengthening for stability during gait, CGA, no AD, and no overt LOB              Static balance activities: CGA   Balance beam tandem stance, B 4 x 30 secs. each with VCs and TCs for improved knee extension  Patient educated throughout session on appropriate technique and form using multi-modal cueing, HEP, and activity modification. Patient articulated understanding and returned demonstration.  Patient Response to interventions: Patient with LE fatigue at end of session particularly after lateral sidestepping.    ASSESSMENT Patient presents to clinic with excellent motivation to participate in therapy. Patient demonstrates deficits in BLE strength, activity tolerance, energy conservation, posture and balance. Patient performed a variety of LE strengthening activities with resistance and emphasis on L LE weightbearing with CGA, no AD, and minimal cueing for proper form. Patient demonstrated improved static balance with each repetition and no overt LOB. Patient responded positively to active and educational  interventions and will benefit from continued skilled therapeutic intervention to address remaining deficits in BLE strength, activity tolerance, energy conservation, and balance in order to decrease risk of falls, increase function, and improve overall QOL.        PT Long Term Goals - 11/17/20 1614       PT LONG TERM GOAL #1   Title Patient will demonstrate improved function as evidenced by a score of 55 on FOTO measure for full participation in activities at home and in the community without limitation.    Baseline IE: 33    Time 6    Period Weeks    Status New    Target Date 12/29/20      PT LONG TERM GOAL #2   Title Patient will demonstrate a 3 seconds improvement with 5TSTS with supervision, minimal use of BUE, and LRAD in order to reduce risk of falls and improve transfers.    Baseline IE: 22.26 secs    Time 6    Period Weeks    Status New    Target Date 12/29/20      PT LONG TERM GOAL #3   Title Patient will be able to demonstrate increased B hip strength in flexion and abduction, >/=4/5 MMT, in order to participate in activities in the home and in the community without limitation.    Baseline IE: B LE 3/5 in flex/abd    Time 6    Period Weeks    Status New    Target Date 12/29/20      PT LONG TERM GOAL #4   Title Patient will report moderate difficulty or less with usual activities around the house and lifting an object from the floor in order to dmeonstrate an improvement in function and in preparation for return to PLOF and participation with community activties including but not limited to golf.    Baseline IE: "quite a bit of difficulty"    Time 6    Period Weeks    Status New    Target Date 12/29/20                   Plan - 12/15/20 1211     Clinical Impression Statement Patient presents to clinic with excellent motivation to participate in therapy. Patient demonstrates deficits in BLE strength, activity tolerance, energy conservation, posture and  balance. Patient performed a variety of LE strengthening activities with resistance and emphasis on L LE weightbearing with CGA, no AD, and minimal cueing for proper form. Patient demonstrated improved static balance with each repetition and no overt LOB. Patient responded positively to active and educational interventions and will benefit from continued skilled therapeutic intervention to address remaining deficits in BLE strength, activity tolerance, energy conservation, and balance in order to decrease risk of falls, increase function, and improve overall QOL.    Personal Factors and Comorbidities Age;Comorbidity 3+;Past/Current Experience;Time since onset of injury/illness/exacerbation    Comorbidities A-fib, acquired thrombophilia, A-fib, COPD Stage 2,  GERD, HTN, hyperlipidemia, hx of breast cancer on L    Examination-Activity Limitations Transfers;Bed Mobility;Bend;Squat;Stairs;Locomotion Level;Stand    Examination-Participation Restrictions Interpersonal Relationship;Laundry;Community Activity;Shop    Stability/Clinical Decision Making Evolving/Moderate complexity    Rehab Potential Fair    PT Frequency 2x / week    PT Duration 6 weeks    PT Treatment/Interventions ADLs/Self Care Home Management;Aquatic Therapy;Cryotherapy;Iontophoresis 4mg /ml Dexamethasone;Moist Heat;Gait training;Stair training;Functional mobility training;Therapeutic activities;Therapeutic exercise;Neuromuscular re-education;Patient/family education;Orthotic Fit/Training;Manual techniques;Passive range of motion;Taping;Joint Manipulations;Spinal Manipulations;Balance training;Energy conservation;Dry needling    PT Next Visit Plan walking endurance, LE strength, balance challenges    PT Home Exercise Plan sidestepping at counter with RTB    Consulted and Agree with Plan of Care Patient;Family member/caregiver    Family Member Consulted Husband             Patient will benefit from skilled therapeutic intervention in  order to improve the following deficits and impairments:  Abnormal gait, Decreased activity tolerance, Decreased balance, Decreased mobility, Decreased range of motion, Decreased strength, Difficulty walking, Hypomobility, Postural dysfunction, Improper body mechanics, Pain, Decreased endurance  Visit Diagnosis: Muscle weakness (generalized)  Difficulty in walking, not elsewhere classified  Abnormal posture  Pain in left hip     Problem List Patient Active Problem List   Diagnosis Date Noted   Closed intracapsular fracture of left femur with routine healing 08/28/2020   Acquired thrombophilia ([place]) 07/15/2020   AF (paroxysmal atrial fibrillation) ([place]) 06/19/2020   Atrial fibrillation with rapid ventricular response ([place]) 06/09/2020   Fracture of femoral neck, left, closed ([place]) 06/05/2020   Atherosclerosis of aorta ([place]) 01/09/2020   Stage 2 moderate COPD by GOLD classification ([place]) 01/09/2020   HSV (herpes simplex virus) infection 01/09/2020   BCC (basal cell carcinoma), face 10/12/2017   Environmental and seasonal allergies 10/30/2014   Psoriasis vulgaris 10/30/2014   Essential (primary) hypertension 10/30/2014   Gastro-esophageal reflux disease without esophagitis 10/30/2014   Hot flash, menopausal 10/30/2014   Hyperlipidemia, mixed 10/30/2014   History of cancer of left breast 10/30/2014   Bronchiectasis ([place]) 09/24/2013    Arhum Peeples, SPT  This entire session was performed under direct supervision and direction of a licensed Chiropractor. I have personally read, edited and approve of the note as written.   Myles Gip PT, DPT [phone]  12/15/2020, 2:47 PM  [facility] [address] [place], [place], [address] Phone: [phone]   Fax:  [phone]  Name: KEVIONNA HEFFLER MRN: [id number] Date of Birth: 1933-05-17

## 2020-12-16 DIAGNOSIS — J479 Bronchiectasis, uncomplicated: Secondary | ICD-10-CM | POA: Diagnosis not present

## 2020-12-22 ENCOUNTER — Encounter: Payer: Medicare PPO | Admitting: Physical Therapy

## 2020-12-24 ENCOUNTER — Encounter: Payer: Self-pay | Admitting: Physical Therapy

## 2020-12-24 ENCOUNTER — Other Ambulatory Visit: Payer: Self-pay

## 2020-12-24 ENCOUNTER — Ambulatory Visit: Payer: Medicare PPO | Admitting: Physical Therapy

## 2020-12-24 DIAGNOSIS — R293 Abnormal posture: Secondary | ICD-10-CM | POA: Diagnosis not present

## 2020-12-24 DIAGNOSIS — M25552 Pain in left hip: Secondary | ICD-10-CM | POA: Diagnosis not present

## 2020-12-24 DIAGNOSIS — R262 Difficulty in walking, not elsewhere classified: Secondary | ICD-10-CM

## 2020-12-24 DIAGNOSIS — M6281 Muscle weakness (generalized): Secondary | ICD-10-CM

## 2020-12-24 NOTE — Therapy (Signed)
[facility] [address] [place], [place], [address] Phone: [phone]   Fax:  [phone]  Physical Therapy Treatment  Patient Details  Name: [name] MRN: [id number] Date of Birth: Aug 03, 1933 Referring Provider (PT): Halina Maidens   Encounter Date: 12/24/2020   PT End of Session - 12/24/20 1321     Visit Number 9    Number of Visits 12    Date for PT Re-Evaluation 12/29/20    Authorization Type IE: 11/17/2020    PT Start Time 1325    PT Stop Time 1408    PT Time Calculation (min) 43 min    Equipment Utilized During Treatment Gait belt    Activity Tolerance Patient tolerated treatment well    Behavior During Therapy [facility] for tasks assessed/performed             Past Medical History:  Diagnosis Date   Acute metabolic encephalopathy 03/09/7251   Allergy    Bronchiectasis ([place])    Cancer ([place])    left breast cancer    COPD (chronic obstructive pulmonary disease) ([place])    GERD (gastroesophageal reflux disease)    Hyperlipemia    Hypertension    Intractable nausea and vomiting 06/20/2018    Past Surgical History:  Procedure Laterality Date   BREAST LUMPECTOMY Left 2008   CHOLECYSTECTOMY     HIP PINNING,CANNULATED Left 06/05/2020   Procedure: CANNULATED HIP PINNING;  Surgeon: Hessie Knows, MD;  Location: [facility] ORS;  Service: Orthopedics;  Laterality: Left;   PARTIAL HYSTERECTOMY     TEE WITHOUT CARDIOVERSION N/A 06/10/2020   Procedure: TRANSESOPHAGEAL ECHOCARDIOGRAM (TEE);  Surgeon: Kate Sable, MD;  Location: [facility] ORS;  Service: Cardiovascular;  Laterality: N/A;    There were no vitals filed for this visit.   Subjective Assessment - 12/24/20 1321     Subjective Patient is doing well today with no nausea and states it has been better over the past 3 days. Patient does not recall doing anything different. Patient has performed HEP with no soreness. Patient participated in household chores with no pain  with mopping/dusting/organizing.    Patient is accompained by: Family member    Currently in Pain? No/denies             TREATMENT  Therapeutic Exercise:  Nu Step x12 mins with B UE/LE use for improved aerobic conditioning, Level 2-5 varied throughout to mimic demands of incline walking, SPM 30-50  Standing R hip flexor stretch, 2x30 secs, for improved hip flexor lengthening   STS with elevated support (airex), x10 with minimal BUE use, supervision for safety STS without elevated support, x10 with use of BUE on chair, supervision for safety   Neuromuscular Re-education Stepping over hurdles (3x6", 2x12"), x4, VCs to maintain upright posture and greater L hip flexion to improve stability during gait, CGA, no AD, and no overt LOB    Gait in hallway, x150 ft with CGA and no SPC, VCs for improved B arm swing during gait and to maintain forward gaze           Patient educated throughout session on appropriate technique and form using multi-modal cueing, HEP, and activity modification. Patient articulated understanding and returned demonstration.   Patient Response to interventions: Patient with slight SOB at the end of gait training but eased with increased rest.   ASSESSMENT Patient presents to clinic with excellent motivation to participate in therapy. Patient demonstrates deficits in BLE strength, activity tolerance, energy conservation, posture and balance.  Patient demonstrates improvement with minimal UE use with STS from an elevated surface. Patient able to perform x10 STS with use of BUE on a level surface. Patient required CGA with gait training, no use of SPC, and minimal cueing for increased arm swing and forward gaze. Patient with basic understanding of tapering down visits after re-assessment next week. Patient responded positively to active and educational interventions during today's session and will benefit from continued skilled therapeutic intervention to address remaining  deficits in BLE strength, activity tolerance, energy conservation, and balance in order to decrease risk of falls, increase function, and improve overall QOL.         PT Long Term Goals - 11/17/20 1614       PT LONG TERM GOAL #1   Title Patient will demonstrate improved function as evidenced by a score of 55 on FOTO measure for full participation in activities at home and in the community without limitation.    Baseline IE: 33    Time 6    Period Weeks    Status New    Target Date 12/29/20      PT LONG TERM GOAL #2   Title Patient will demonstrate a 3 seconds improvement with 5TSTS with supervision, minimal use of BUE, and LRAD in order to reduce risk of falls and improve transfers.    Baseline IE: 22.26 secs    Time 6    Period Weeks    Status New    Target Date 12/29/20      PT LONG TERM GOAL #3   Title Patient will be able to demonstrate increased B hip strength in flexion and abduction, >/=4/5 MMT, in order to participate in activities in the home and in the community without limitation.    Baseline IE: B LE 3/5 in flex/abd    Time 6    Period Weeks    Status New    Target Date 12/29/20      PT LONG TERM GOAL #4   Title Patient will report moderate difficulty or less with usual activities around the house and lifting an object from the floor in order to dmeonstrate an improvement in function and in preparation for return to PLOF and participation with community activties including but not limited to golf.    Baseline IE: "quite a bit of difficulty"    Time 6    Period Weeks    Status New    Target Date 12/29/20                   Plan - 12/24/20 1321     Clinical Impression Statement Patient presents to clinic with excellent motivation to participate in therapy. Patient demonstrates deficits in BLE strength, activity tolerance, energy conservation, posture and balance. Patient demonstrates improvement with minimal UE use with STS from an elevated surface.  Patient able to perform x10 STS with use of BUE on a level surface. Patient required CGA with gait training, no use of SPC, and minimal cueing for increased arm swing and forward gaze. Patient with basic understanding of tapering down visits after re-assessment next week. Patient responded positively to active and educational interventions during today's session and will benefit from continued skilled therapeutic intervention to address remaining deficits in BLE strength, activity tolerance, energy conservation, and balance in order to decrease risk of falls, increase function, and improve overall QOL.    Personal Factors and Comorbidities Age;Comorbidity 3+;Past/Current Experience;Time since onset of injury/illness/exacerbation    Comorbidities A-fib, acquired  thrombophilia, A-fib, COPD Stage 2, GERD, HTN, hyperlipidemia, hx of breast cancer on L    Examination-Activity Limitations Transfers;Bed Mobility;Bend;Squat;Stairs;Locomotion Level;Stand    Examination-Participation Restrictions Interpersonal Relationship;Laundry;Community Activity;Shop    Stability/Clinical Decision Making Evolving/Moderate complexity    Rehab Potential Fair    PT Frequency 2x / week    PT Duration 6 weeks    PT Treatment/Interventions ADLs/Self Care Home Management;Aquatic Therapy;Cryotherapy;Iontophoresis 4mg /ml Dexamethasone;Moist Heat;Gait training;Stair training;Functional mobility training;Therapeutic activities;Therapeutic exercise;Neuromuscular re-education;Patient/family education;Orthotic Fit/Training;Manual techniques;Passive range of motion;Taping;Joint Manipulations;Spinal Manipulations;Balance training;Energy conservation;Dry needling    PT Next Visit Plan reassess goals    PT Home Exercise Plan STS with elevated surface at counter    Consulted and Agree with Plan of Care Patient;Family member/caregiver    Family Member Consulted Husband             Patient will benefit from skilled therapeutic  intervention in order to improve the following deficits and impairments:  Abnormal gait, Decreased activity tolerance, Decreased balance, Decreased mobility, Decreased range of motion, Decreased strength, Difficulty walking, Hypomobility, Postural dysfunction, Improper body mechanics, Pain, Decreased endurance  Visit Diagnosis: Muscle weakness (generalized)  Difficulty in walking, not elsewhere classified  Abnormal posture  Pain in left hip     Problem List Patient Active Problem List   Diagnosis Date Noted   Closed intracapsular fracture of left femur with routine healing 08/28/2020   Acquired thrombophilia ([place]) 07/15/2020   AF (paroxysmal atrial fibrillation) ([place]) 06/19/2020   Atrial fibrillation with rapid ventricular response ([place]) 06/09/2020   Fracture of femoral neck, left, closed ([place]) 06/05/2020   Atherosclerosis of aorta ([place]) 01/09/2020   Stage 2 moderate COPD by GOLD classification ([place]) 01/09/2020   HSV (herpes simplex virus) infection 01/09/2020   BCC (basal cell carcinoma), face 10/12/2017   Environmental and seasonal allergies 10/30/2014   Psoriasis vulgaris 10/30/2014   Essential (primary) hypertension 10/30/2014   Gastro-esophageal reflux disease without esophagitis 10/30/2014   Hot flash, menopausal 10/30/2014   Hyperlipidemia, mixed 10/30/2014   History of cancer of left breast 10/30/2014   Bronchiectasis ([place]) 09/24/2013   Marvin Grabill, SPT  This entire session was performed under direct supervision and direction of a licensed Chiropractor. I have personally read, edited and approve of the note as written. Myles Gip PT, DPT [phone]  12/24/2020, 2:28 PM  [facility] [address] [place], [place], [address] Phone: [phone]   Fax:  [phone]  Name: [name] MRN: [id number] Date of Birth: 08/18/1933

## 2020-12-25 DIAGNOSIS — Z1231 Encounter for screening mammogram for malignant neoplasm of breast: Secondary | ICD-10-CM | POA: Diagnosis not present

## 2020-12-29 ENCOUNTER — Other Ambulatory Visit: Payer: Self-pay

## 2020-12-29 ENCOUNTER — Encounter: Payer: Self-pay | Admitting: Physical Therapy

## 2020-12-29 ENCOUNTER — Ambulatory Visit: Payer: Medicare PPO | Admitting: Physical Therapy

## 2020-12-29 DIAGNOSIS — R262 Difficulty in walking, not elsewhere classified: Secondary | ICD-10-CM | POA: Diagnosis not present

## 2020-12-29 DIAGNOSIS — M6281 Muscle weakness (generalized): Secondary | ICD-10-CM | POA: Diagnosis not present

## 2020-12-29 DIAGNOSIS — M25552 Pain in left hip: Secondary | ICD-10-CM | POA: Diagnosis not present

## 2020-12-29 DIAGNOSIS — R293 Abnormal posture: Secondary | ICD-10-CM

## 2020-12-29 NOTE — Therapy (Signed)
[place] [facility] [address] [place], [place], [address] Phone: [phone]   Fax:  [phone]  Physical Therapy Treatment  Patient Details  Name: [name] MRN: [id number] Date of Birth: 01/11/1934 Referring Provider (PT): Halina Maidens   Encounter Date: 12/29/2020   PT End of Session - 12/29/20 1158     Visit Number 10    Number of Visits 18    Date for PT Re-Evaluation 02/09/21    Authorization Type IE: 11/17/2020    PT Start Time 1200    PT Stop Time 1240    PT Time Calculation (min) 40 min    Equipment Utilized During Treatment Gait belt    Activity Tolerance Patient tolerated treatment well    Behavior During Therapy [facility] for tasks assessed/performed             Past Medical History:  Diagnosis Date   Acute metabolic encephalopathy 69/48/5462   Allergy    Bronchiectasis ([place])    Cancer ([place])    left breast cancer    COPD (chronic obstructive pulmonary disease) ([place])    GERD (gastroesophageal reflux disease)    Hyperlipemia    Hypertension    Intractable nausea and vomiting 06/20/2018    Past Surgical History:  Procedure Laterality Date   BREAST LUMPECTOMY Left 2008   CHOLECYSTECTOMY     HIP PINNING,CANNULATED Left 06/05/2020   Procedure: CANNULATED HIP PINNING;  Surgeon: Hessie Knows, MD;  Location: [facility] ORS;  Service: Orthopedics;  Laterality: Left;   PARTIAL HYSTERECTOMY     TEE WITHOUT CARDIOVERSION N/A 06/10/2020   Procedure: TRANSESOPHAGEAL ECHOCARDIOGRAM (TEE);  Surgeon: Kate Sable, MD;  Location: [facility] ORS;  Service: Cardiovascular;  Laterality: N/A;    There were no vitals filed for this visit.   Subjective Assessment - 12/29/20 1158     Subjective Patient enters clinic without SPC and no nausea today. Patient reports being a bit stressed because she has company in town. Patient used treking stick while walking outside more for safety in case she loses her balance.    Patient is  accompained by: Family member    Currently in Pain? No/denies                [facility] PT Assessment - 12/29/20 0001       Standardized Balance Assessment   Standardized Balance Assessment Dynamic Gait Index      Dynamic Gait Index   Level Surface Normal    Change in Gait Speed Normal    Gait with Horizontal Head Turns Moderate Impairment    Gait with Vertical Head Turns Mild Impairment    Gait and Pivot Turn Normal    Step Over Obstacle Normal    Step Around Obstacles Normal    Steps Mild Impairment    Total Score 20             TREATMENT  Neuromuscular Re-education:  5TSTS: 16.91 secs with no AD and supervision  DGI: 20/24 with no AD and CGA for safety and balance   -Horizontal head turns with gait increased dizziness   -Vertical head turns with gait slight dizziness   Gait in the clinic: Increased lumbar flexion with decreased arm swing and decreased stride length, no use of AD and at most CGA for safety and balance   Patient discussion on discharge date due to achievement of long-term goals set. Patient verbalized understanding and agreed to take time to think about decision. Patient and SPT will  go over HEP next session providing more interventions targeting LE strengthening/endurance with modifications as necessary.           Patient educated throughout session on appropriate technique and form using multi-modal cueing, HEP, and activity modification. Patient articulated understanding and returned demonstration.   Patient Response to interventions: Patient feels a lot better especially with the nausea since the initial evaluation.    ASSESSMENT Patient presents to clinic with excellent motivation to participate in therapy. Patient demonstrates deficits in BLE strength, activity tolerance, energy conservation, posture and balance. Patient demonstrates significant improvement on FOTO-Hip (57) with minimal limitation participating in activities in the home and the  community. Patient also improved on the 5TSTS by 6 seconds and the DGI by 5 points. Patient has improved in mobility with no use of the [facility] but still demonstrates endurance and gait mechanic deficits. Patient with basic understanding of tapering down visits after Thursday in order to promote independence with a comprehensive HEP. Patient responded positively to active and educational interventions during today's session and will benefit from continued skilled therapeutic intervention to address remaining deficits in BLE strength, activity tolerance, energy conservation, and balance in order to decrease risk of falls, increase function, and improve overall QOL.     PT Long Term Goals - 12/29/20 1210       PT LONG TERM GOAL #1   Title Patient will demonstrate improved function as evidenced by a score of 55 on FOTO measure for full participation in activities at home and in the community without limitation.    Baseline IE: 33; 10/25: 57    Time 6    Period Weeks    Status Achieved      PT LONG TERM GOAL #2   Title Patient will demonstrate a 3 seconds improvement with 5TSTS with supervision, minimal use of BUE, and LRAD in order to reduce risk of falls and improve transfers.    Baseline IE: 22.26 secs; 10/25: 16.91    Time 6    Period Weeks    Status Achieved      PT LONG TERM GOAL #3   Title Patient will be able to demonstrate increased B hip strength in flexion and abduction, >/=4/5 MMT, in order to participate in activities in the home and in the community without limitation.    Baseline IE: B LE 3/5 in flex/abd; 10/25: B LE 4/5 flex, 5/5 abd    Time 6    Period Weeks    Status Achieved      PT LONG TERM GOAL #4   Title Patient will report moderate difficulty or less with usual activities around the house and lifting an object from the floor in order to demonstrate an improvement in function and in preparation for return to PLOF and participation with community activties including but not  limited to golf.    Baseline IE: "quite a bit of difficulty"; 10/25: a little bit of difficulty    Time 6    Period Weeks    Status Achieved      PT LONG TERM GOAL #5   Title Patient will be independent with comprehensive HEP given for BLE strengthening and endurance over the course of 2 weeks in order to promote full independence and improve QOL.    Baseline 10/25: will give comprehensive HEP on Thurs    Time 6    Period Weeks    Status New    Target Date 02/09/21  Plan - 12/29/20 1159     Clinical Impression Statement Patient presents to clinic with excellent motivation to participate in therapy. Patient demonstrates deficits in BLE strength, activity tolerance, energy conservation, posture and balance. Patient demonstrates significant improvement on FOTO-Hip (57) with minimal limitation participating in activities in the home and the community. Patient also improved on the 5TSTS by 6 seconds and the DGI by 5 points. Patient has improved in mobility with no use of the [facility] but still demonstrates endurance and gait mechanic deficits. Patient with basic understanding of tapering down visits after Thursday in order to promote independence with a comprehensive HEP. Patient responded positively to active and educational interventions during today's session and will benefit from continued skilled therapeutic intervention to address remaining deficits in BLE strength, activity tolerance, energy conservation, and balance in order to decrease risk of falls, increase function, and improve overall QOL.    Personal Factors and Comorbidities Age;Comorbidity 3+;Past/Current Experience;Time since onset of injury/illness/exacerbation    Comorbidities A-fib, acquired thrombophilia, A-fib, COPD Stage 2, GERD, HTN, hyperlipidemia, hx of breast cancer on L    Examination-Activity Limitations Transfers;Bed Mobility;Bend;Squat;Stairs;Locomotion Level;Stand    Examination-Participation  Restrictions Interpersonal Relationship;Laundry;Community Activity;Shop    Stability/Clinical Decision Making Evolving/Moderate complexity    Rehab Potential Fair    PT Frequency 2x / week    PT Duration 6 weeks    PT Treatment/Interventions ADLs/Self Care Home Management;Aquatic Therapy;Cryotherapy;Iontophoresis 4mg /ml Dexamethasone;Moist Heat;Gait training;Stair training;Functional mobility training;Therapeutic activities;Therapeutic exercise;Neuromuscular re-education;Patient/family education;Orthotic Fit/Training;Manual techniques;Passive range of motion;Taping;Joint Manipulations;Spinal Manipulations;Balance training;Energy conservation;Dry needling    PT Next Visit Plan comprehensive HEP    PT Home Exercise Plan STS with elevated surface at counter    Consulted and Agree with Plan of Care Patient;Family member/caregiver    Family Member Consulted Husband             Patient will benefit from skilled therapeutic intervention in order to improve the following deficits and impairments:  Abnormal gait, Decreased activity tolerance, Decreased balance, Decreased mobility, Decreased range of motion, Decreased strength, Difficulty walking, Hypomobility, Postural dysfunction, Improper body mechanics, Pain, Decreased endurance  Visit Diagnosis: Muscle weakness (generalized)  Difficulty in walking, not elsewhere classified  Abnormal posture  Pain in left hip     Problem List Patient Active Problem List   Diagnosis Date Noted   Closed intracapsular fracture of left femur with routine healing 08/28/2020   Acquired thrombophilia ([place]) 07/15/2020   AF (paroxysmal atrial fibrillation) ([place]) 06/19/2020   Atrial fibrillation with rapid ventricular response ([place]) 06/09/2020   Fracture of femoral neck, left, closed ([place]) 06/05/2020   Atherosclerosis of aorta ([place]) 01/09/2020   Stage 2 moderate COPD by GOLD classification ([place]) 01/09/2020   HSV (herpes simplex virus) infection 01/09/2020    BCC (basal cell carcinoma), face 10/12/2017   Environmental and seasonal allergies 10/30/2014   Psoriasis vulgaris 10/30/2014   Essential (primary) hypertension 10/30/2014   Gastro-esophageal reflux disease without esophagitis 10/30/2014   Hot flash, menopausal 10/30/2014   Hyperlipidemia, mixed 10/30/2014   History of cancer of left breast 10/30/2014   Bronchiectasis ([place]) 09/24/2013    Conley Pawling, SPT  This entire session was performed under direct supervision and direction of a licensed Chiropractor. I have personally read, edited and approve of the note as written.  Myles Gip PT, DPT [phone]  12/29/2020, 1:52 PM  [place] [facility] [address] [place], [place], [address] Phone: [phone]   Fax:  [phone]  Name: [name] MRN:  [id number] Date of Birth: April 18, 1933

## 2020-12-31 ENCOUNTER — Other Ambulatory Visit: Payer: Self-pay

## 2020-12-31 ENCOUNTER — Ambulatory Visit: Payer: Medicare PPO | Admitting: Physical Therapy

## 2020-12-31 ENCOUNTER — Encounter: Payer: Self-pay | Admitting: Physical Therapy

## 2020-12-31 DIAGNOSIS — M25552 Pain in left hip: Secondary | ICD-10-CM | POA: Diagnosis not present

## 2020-12-31 DIAGNOSIS — M6281 Muscle weakness (generalized): Secondary | ICD-10-CM | POA: Diagnosis not present

## 2020-12-31 DIAGNOSIS — R293 Abnormal posture: Secondary | ICD-10-CM

## 2020-12-31 DIAGNOSIS — R262 Difficulty in walking, not elsewhere classified: Secondary | ICD-10-CM

## 2020-12-31 NOTE — Therapy (Signed)
[facility] [address] [place], [place], [address] Phone: [phone]   Fax:  [phone]  Physical Therapy Treatment  Patient Details  Name: [name] MRN: [id number] Date of Birth: 04-17-1933 Referring Provider (PT): Halina Maidens   Encounter Date: 12/31/2020   PT End of Session - 12/31/20 1153     Visit Number 11    Number of Visits 18    Date for PT Re-Evaluation 02/09/21    Authorization Type IE: 11/17/2020    PT Start Time 1155    PT Stop Time 1233    PT Time Calculation (min) 38 min    Equipment Utilized During Treatment Gait belt    Activity Tolerance Patient tolerated treatment well    Behavior During Therapy [facility] for tasks assessed/performed             Past Medical History:  Diagnosis Date   Acute metabolic encephalopathy 93/71/6967   Allergy    Bronchiectasis ([place])    Cancer ([place])    left breast cancer    COPD (chronic obstructive pulmonary disease) ([place])    GERD (gastroesophageal reflux disease)    Hyperlipemia    Hypertension    Intractable nausea and vomiting 06/20/2018    Past Surgical History:  Procedure Laterality Date   BREAST LUMPECTOMY Left 2008   CHOLECYSTECTOMY     HIP PINNING,CANNULATED Left 06/05/2020   Procedure: CANNULATED HIP PINNING;  Surgeon: Hessie Knows, MD;  Location: [facility] ORS;  Service: Orthopedics;  Laterality: Left;   PARTIAL HYSTERECTOMY     TEE WITHOUT CARDIOVERSION N/A 06/10/2020   Procedure: TRANSESOPHAGEAL ECHOCARDIOGRAM (TEE);  Surgeon: Kate Sable, MD;  Location: [facility] ORS;  Service: Cardiovascular;  Laterality: N/A;    There were no vitals filed for this visit.   Subjective Assessment - 12/31/20 1153     Subjective Patient enters clinic without SPC and has not has nausea for one week. Patient participates in daily walks outside or at the mall. Patient has also continued with HEP.    Patient is accompained by: Family member    Currently in Pain? No/denies              TREATMENT  Therapeutic Exercise Comprehensive overview of HEP with added challenges and modifications as needed   Patient discussion on returning in ~3 weeks to adjust HEP, if necessary, to promote full independence with physical activity in the home and in the community. Patient verbalized understanding and agreed to SPT decision.   LE strengthening to promote management of pain, postural stability, and to maintain active lifestyle: -Seated LAQs -Seated and standing heel/toe raises  -Standing lunges with counter support, B x10 -Tandem walking with UE support at counter, x4 -STSs with minimal use of UE, x10 -Standing pallof press with GTB, B x10 -Seated thoracic extension, x10          Patient educated throughout session on appropriate technique and form using multi-modal cueing, HEP, and activity modification. Patient articulated understanding and returned demonstration.   Patient Response to interventions: Patient comfortable to return in ~3 weeks to see progression and adjust HEP, if needed.    ASSESSMENT Patient presents to clinic with excellent motivation to participate in therapy. Patient demonstrates deficits in BLE strength, activity tolerance, energy conservation, and posture. Patient demonstrates understanding of all active interventions on HEP and performed various LE strengthening activities in order to achieve long-term goal of full IND with physical activity in the home/community. Patient with basic understanding of maintenance  program and agrees to return for another session to assess progress. Patient responded positively to active and educational interventions during today's session and will benefit from continued skilled therapeutic intervention to address remaining deficits in BLE strength, activity tolerance, energy conservation, and posture.        PT Long Term Goals - 12/29/20 1210       PT LONG TERM GOAL #1   Title Patient will demonstrate  improved function as evidenced by a score of 55 on FOTO measure for full participation in activities at home and in the community without limitation.    Baseline IE: 33; 10/25: 57    Time 6    Period Weeks    Status Achieved      PT LONG TERM GOAL #2   Title Patient will demonstrate a 3 seconds improvement with 5TSTS with supervision, minimal use of BUE, and LRAD in order to reduce risk of falls and improve transfers.    Baseline IE: 22.26 secs; 10/25: 16.91    Time 6    Period Weeks    Status Achieved      PT LONG TERM GOAL #3   Title Patient will be able to demonstrate increased B hip strength in flexion and abduction, >/=4/5 MMT, in order to participate in activities in the home and in the community without limitation.    Baseline IE: B LE 3/5 in flex/abd; 10/25: B LE 4/5 flex, 5/5 abd    Time 6    Period Weeks    Status Achieved      PT LONG TERM GOAL #4   Title Patient will report moderate difficulty or less with usual activities around the house and lifting an object from the floor in order to demonstrate an improvement in function and in preparation for return to PLOF and participation with community activties including but not limited to golf.    Baseline IE: "quite a bit of difficulty"; 10/25: a little bit of difficulty    Time 6    Period Weeks    Status Achieved      PT LONG TERM GOAL #5   Title Patient will be independent with comprehensive HEP given for BLE strengthening and endurance over the course of 2 weeks in order to promote full independence and improve QOL.    Baseline 10/25: will give comprehensive HEP on Thurs    Time 6    Period Weeks    Status New    Target Date 02/09/21                   Plan - 12/31/20 1153     Clinical Impression Statement Patient presents to clinic with excellent motivation to participate in therapy. Patient demonstrates deficits in BLE strength, activity tolerance, energy conservation, and posture. Patient demonstrates  understanding of all active interventions on HEP and performed various LE strengthening activities in order to achieve long-term goal of full IND with physical activity in the home/community. Patient with basic understanding of maintenance program and agrees to return for another session to assess progress. Patient responded positively to active and educational interventions during today's session and will benefit from continued skilled therapeutic intervention to address remaining deficits in BLE strength, activity tolerance, energy conservation, and posture.    Personal Factors and Comorbidities Age;Comorbidity 3+;Past/Current Experience;Time since onset of injury/illness/exacerbation    Comorbidities A-fib, acquired thrombophilia, A-fib, COPD Stage 2, GERD, HTN, hyperlipidemia, hx of breast cancer on L    Examination-Activity Limitations Transfers;Bed Mobility;Bend;Squat;Stairs;Locomotion Level;Stand  Examination-Participation Restrictions Interpersonal Relationship;Laundry;Community Activity;Shop    Stability/Clinical Decision Making Evolving/Moderate complexity    Rehab Potential Fair    PT Frequency 2x / week    PT Duration 6 weeks    PT Treatment/Interventions ADLs/Self Care Home Management;Aquatic Therapy;Cryotherapy;Iontophoresis 4mg /ml Dexamethasone;Moist Heat;Gait training;Stair training;Functional mobility training;Therapeutic activities;Therapeutic exercise;Neuromuscular re-education;Patient/family education;Orthotic Fit/Training;Manual techniques;Passive range of motion;Taping;Joint Manipulations;Spinal Manipulations;Balance training;Energy conservation;Dry needling    PT Next Visit Plan how is HEP?    PT Home Exercise Plan comprehensive HEP    Consulted and Agree with Plan of Care Patient;Family member/caregiver    Family Member Consulted Husband             Patient will benefit from skilled therapeutic intervention in order to improve the following deficits and impairments:   Abnormal gait, Decreased activity tolerance, Decreased balance, Decreased mobility, Decreased range of motion, Decreased strength, Difficulty walking, Hypomobility, Postural dysfunction, Improper body mechanics, Pain, Decreased endurance  Visit Diagnosis: Muscle weakness (generalized)  Difficulty in walking, not elsewhere classified  Abnormal posture  Pain in left hip     Problem List Patient Active Problem List   Diagnosis Date Noted   Closed intracapsular fracture of left femur with routine healing 08/28/2020   Acquired thrombophilia ([place]) 07/15/2020   AF (paroxysmal atrial fibrillation) ([place]) 06/19/2020   Atrial fibrillation with rapid ventricular response ([place]) 06/09/2020   Fracture of femoral neck, left, closed ([place]) 06/05/2020   Atherosclerosis of aorta ([place]) 01/09/2020   Stage 2 moderate COPD by GOLD classification ([place]) 01/09/2020   HSV (herpes simplex virus) infection 01/09/2020   BCC (basal cell carcinoma), face 10/12/2017   Environmental and seasonal allergies 10/30/2014   Psoriasis vulgaris 10/30/2014   Essential (primary) hypertension 10/30/2014   Gastro-esophageal reflux disease without esophagitis 10/30/2014   Hot flash, menopausal 10/30/2014   Hyperlipidemia, mixed 10/30/2014   History of cancer of left breast 10/30/2014   Bronchiectasis ([place]) 09/24/2013    Nickola Lenig, SPT  This entire session was performed under direct supervision and direction of a licensed Chiropractor. I have personally read, edited and approve of the note as written.  Myles Gip PT, DPT [phone]  12/31/2020, 2:44 PM  [facility] [address] [place], [place], [address] Phone: [phone]   Fax:  [phone]  Name: [name] MRN: [id number] Date of Birth: Dec 12, 1933

## 2021-01-14 ENCOUNTER — Other Ambulatory Visit: Payer: Self-pay

## 2021-01-14 ENCOUNTER — Ambulatory Visit (INDEPENDENT_AMBULATORY_CARE_PROVIDER_SITE_OTHER): Payer: Medicare PPO | Admitting: Internal Medicine

## 2021-01-14 ENCOUNTER — Encounter: Payer: Self-pay | Admitting: Internal Medicine

## 2021-01-14 ENCOUNTER — Other Ambulatory Visit: Payer: Self-pay | Admitting: Internal Medicine

## 2021-01-14 VITALS — BP 110/72 | HR 67 | Ht 63.0 in | Wt 122.0 lb

## 2021-01-14 DIAGNOSIS — I10 Essential (primary) hypertension: Secondary | ICD-10-CM

## 2021-01-14 DIAGNOSIS — I480 Paroxysmal atrial fibrillation: Secondary | ICD-10-CM | POA: Diagnosis not present

## 2021-01-14 DIAGNOSIS — N3000 Acute cystitis without hematuria: Secondary | ICD-10-CM

## 2021-01-14 DIAGNOSIS — E782 Mixed hyperlipidemia: Secondary | ICD-10-CM

## 2021-01-14 DIAGNOSIS — R110 Nausea: Secondary | ICD-10-CM

## 2021-01-14 DIAGNOSIS — Z0000 Encounter for general adult medical examination without abnormal findings: Secondary | ICD-10-CM

## 2021-01-14 DIAGNOSIS — J3089 Other allergic rhinitis: Secondary | ICD-10-CM

## 2021-01-14 LAB — POC URINALYSIS WITH MICROSCOPIC (NON AUTO)MANUAL RESULT
Bilirubin, UA: NEGATIVE
Crystals: 0
Glucose, UA: NEGATIVE
Ketones, UA: NEGATIVE
Mucus, UA: 0
Nitrite, UA: POSITIVE
Protein, UA: NEGATIVE
RBC: 0 M/uL — AB (ref 4.04–5.48)
Spec Grav, UA: 1.015 (ref 1.010–1.025)
Urobilinogen, UA: 0.2 E.U./dL
WBC Casts, UA: 100
pH, UA: 6 (ref 5.0–8.0)

## 2021-01-14 MED ORDER — FLUTICASONE PROPIONATE 50 MCG/ACT NA SUSP: NASAL | 3 refills | Status: DC

## 2021-01-14 MED ORDER — PROMETHAZINE HCL 12.5 MG PO TABS: 12.5000 mg | ORAL_TABLET | Freq: Three times a day (TID) | ORAL | 0 refills | Status: DC | PRN

## 2021-01-14 MED ORDER — FLUCONAZOLE 100 MG PO TABS: 100.0000 mg | ORAL_TABLET | Freq: Every day | ORAL | 0 refills | Status: AC

## 2021-01-14 MED ORDER — NITROFURANTOIN MONOHYD MACRO 100 MG PO CAPS: 100.0000 mg | ORAL_CAPSULE | Freq: Two times a day (BID) | ORAL | 0 refills | Status: AC

## 2021-01-14 NOTE — Progress Notes (Signed)
Date:  01/14/2021   Name:  [name]   DOB:  12-Feb-1934   MRN:  [id number]   Chief Complaint: Annual Exam (Breast Exam. No pap. ) [name] is a 87 y.o. female who presents today for her Complete Annual Exam. She feels fairly well. She reports exercising -not at this time. She reports she is sleeping fairly well. Breast complaints - none.  Mammogram: 12/2020 DEXA: 2014 Pap smear: discontinued Colonoscopy: aged out  Immunization History  Administered Date(s) Administered   Influenza Inj Mdck Quad Pf 12/15/2017   Influenza,inj,Quad PF,6+ Mos 11/25/2014, 11/21/2019   Influenza-Unspecified 11/25/2016, 11/23/2018, 12/11/2020   PFIZER(Purple Top)SARS-COV-2 Vaccination 05/06/2019, 05/26/2019, 12/04/2019   Pfizer Covid-19 Vaccine Bivalent Booster 73yrs & up 12/11/2020   Pneumococcal Conjugate-13 11/22/2013   Pneumococcal Polysaccharide-23 03/08/2000   Tdap 07/06/2007   Zoster Recombinat (Shingrix) 01/06/2017, 05/17/2017   Zoster, Live 03/08/2005    Hypertension This is a chronic problem. The problem is controlled. Pertinent negatives include no chest pain, headaches, palpitations or shortness of breath. Past treatments include beta blockers and diuretics. The current treatment provides significant improvement.  Hyperlipidemia The problem is controlled. Pertinent negatives include no chest pain or shortness of breath. Current antihyperlipidemic treatment includes statins.  Dysuria  This is a new problem. The current episode started in the past 7 days. The problem occurs every urination. The problem has been unchanged. The quality of the pain is described as burning. The pain is mild. There has been no fever. Associated symptoms include nausea (much improved). Pertinent negatives include no chills, frequency or vomiting. She has tried increased fluids (and AZO) for the symptoms.  Atrial fibrillation - paroxysmal; on metoprolol and Pradaxa.  Lab Results  Component Value Date    CREATININE 1.04 (H) 08/16/2020   BUN 22 08/16/2020   NA 140 08/16/2020   K 3.5 08/16/2020   CL 105 08/16/2020   CO2 28 08/16/2020   Lab Results  Component Value Date   CHOL 206 (H) 01/09/2020   HDL 93 01/09/2020   LDLCALC 98 01/09/2020   TRIG 86 01/09/2020   CHOLHDL 2.2 01/09/2020   Lab Results  Component Value Date   TSH 3.538 06/09/2020   No results found for: HGBA1C Lab Results  Component Value Date   WBC 12.3 (H) 08/16/2020   HGB 13.9 08/16/2020   HCT 41.3 08/16/2020   MCV 89.4 08/16/2020   PLT 232 08/16/2020   Lab Results  Component Value Date   ALT 17 07/09/2020   AST 24 07/09/2020   ALKPHOS 98 07/09/2020   BILITOT 0.7 07/09/2020     Review of Systems  Constitutional:  Negative for chills, fatigue and fever.  HENT:  Negative for congestion, hearing loss, tinnitus, trouble swallowing and voice change.   Eyes:  Negative for visual disturbance.  Respiratory:  Negative for cough, chest tightness, shortness of breath and wheezing.   Cardiovascular:  Negative for chest pain, palpitations and leg swelling.  Gastrointestinal:  Positive for nausea (much improved). Negative for abdominal pain, constipation, diarrhea and vomiting.  Endocrine: Negative for polydipsia and polyuria.  Genitourinary:  Positive for dysuria. Negative for frequency, genital sores, vaginal bleeding and vaginal discharge.  Musculoskeletal:  Negative for arthralgias, gait problem and joint swelling.  Skin:  Negative for color change and rash.  Neurological:  Negative for dizziness, tremors, light-headedness and headaches.  Hematological:  Negative for adenopathy. Does not bruise/bleed easily.  Psychiatric/Behavioral:  Negative for dysphoric mood and sleep disturbance. The patient is  not nervous/anxious.    Patient Active Problem List   Diagnosis Date Noted   Closed intracapsular fracture of left femur with routine healing 08/28/2020   Acquired thrombophilia ([place]) 07/15/2020   AF (paroxysmal  atrial fibrillation) ([place]) 06/19/2020   Fracture of femoral neck, left, closed ([place]) 06/05/2020   Atherosclerosis of aorta ([place]) 01/09/2020   Stage 2 moderate COPD by GOLD classification ([place]) 01/09/2020   HSV (herpes simplex virus) infection 01/09/2020   BCC (basal cell carcinoma), face 10/12/2017   Environmental and seasonal allergies 10/30/2014   Psoriasis vulgaris 10/30/2014   Essential (primary) hypertension 10/30/2014   Gastro-esophageal reflux disease without esophagitis 10/30/2014   Hot flash, menopausal 10/30/2014   Hyperlipidemia, mixed 10/30/2014   History of cancer of left breast 10/30/2014   Bronchiectasis ([place]) 09/24/2013    Allergies  Allergen Reactions   Amoxicillin Nausea Only   Eliquis [Apixaban] Nausea Only   Xarelto [Rivaroxaban] Nausea Only    Past Surgical History:  Procedure Laterality Date   BREAST LUMPECTOMY Left 2008   CHOLECYSTECTOMY     HIP PINNING,CANNULATED Left 06/05/2020   Procedure: CANNULATED HIP PINNING;  Surgeon: Hessie Knows, MD;  Location: [facility] ORS;  Service: Orthopedics;  Laterality: Left;   PARTIAL HYSTERECTOMY     TEE WITHOUT CARDIOVERSION N/A 06/10/2020   Procedure: TRANSESOPHAGEAL ECHOCARDIOGRAM (TEE);  Surgeon: Kate Sable, MD;  Location: [facility] ORS;  Service: Cardiovascular;  Laterality: N/A;    Social History   Tobacco Use   Smoking status: Never   Smokeless tobacco: Never  Vaping Use   Vaping Use: Never used  Substance Use Topics   Alcohol use: No   Drug use: No     Medication list has been reviewed and updated.  Current Meds  Medication Sig   atorvastatin (LIPITOR) 20 MG tablet Take 1 tablet (20 mg total) by mouth daily.   cetirizine (ZYRTEC) 10 MG tablet Take 1 tablet (10 mg total) by mouth daily.   dabigatran (PRADAXA) 150 MG CAPS capsule Take 1 capsule (150 mg total) by mouth 2 (two) times daily.   famotidine (PEPCID) 10 MG tablet Take 10 mg by mouth daily.   fluticasone (FLONASE) 50 MCG/ACT nasal spray SHAKE  LIQUID AND USE 2 SPRAYS IN EACH NOSTRIL DAILY AS NEEDED FOR ALLERGIES OR RHINITIS   hydrochlorothiazide (MICROZIDE) 12.5 MG capsule Take 12.5 mg by mouth daily.   methotrexate (RHEUMATREX) 2.5 MG tablet Take 2.5 mg by mouth once a week. Caution:Chemotherapy. Protect from light.   metoprolol tartrate (LOPRESSOR) 25 MG tablet Take 0.5 tablets (12.5 mg total) by mouth 2 (two) times daily.   OXYGEN Inhale into the lungs. 2 lpm [place] nightly   promethazine (PHENERGAN) 12.5 MG tablet Take 1 tablet (12.5 mg total) by mouth every 8 (eight) hours as needed for up to 5 days for nausea or vomiting.   triamcinolone (KENALOG) 0.025 % cream Apply 1 application topically 2 (two) times daily. To external genitalia    PHQ 2/9 Scores 01/14/2021 11/25/2020 11/11/2020 10/20/2020  PHQ - 2 Score 0 0 0 0  PHQ- 9 Score 0 6 0 9    GAD 7 : Generalized Anxiety Score 01/14/2021 11/25/2020 11/11/2020 10/20/2020  Nervous, Anxious, on Edge 0 0 0 0  Control/stop worrying 0 0 0 0  Worry too much - different things 0 0 0 0  Trouble relaxing 0 0 0 0  Restless 0 0 0 0  Easily annoyed or irritable 0 0 0 0  Afraid - awful might happen 0 0 0  0  Total GAD 7 Score 0 0 0 0  Anxiety Difficulty Not difficult at all Not difficult at all - Not difficult at all    BP Readings from Last 3 Encounters:  01/14/21 110/72  12/02/20 124/76  11/25/20 128/74    Physical Exam Vitals and nursing note reviewed.  Constitutional:      General: She is not in acute distress.    Appearance: She is well-developed.  HENT:     Head: Normocephalic and atraumatic.     Right Ear: Tympanic membrane and ear canal normal.     Left Ear: Tympanic membrane and ear canal normal.     Nose:     Right Sinus: No maxillary sinus tenderness.     Left Sinus: No maxillary sinus tenderness.  Eyes:     General: No scleral icterus.       Right eye: No discharge.        Left eye: No discharge.     Conjunctiva/sclera: Conjunctivae normal.  Neck:     Thyroid: No  thyromegaly.     Vascular: No carotid bruit.  Cardiovascular:     Rate and Rhythm: Normal rate and regular rhythm.     Pulses: Normal pulses.     Heart sounds: Normal heart sounds.  Pulmonary:     Effort: Pulmonary effort is normal. No respiratory distress.     Breath sounds: No wheezing.  Abdominal:     General: Bowel sounds are normal.     Palpations: Abdomen is soft.     Tenderness: There is no abdominal tenderness.  Musculoskeletal:     Cervical back: Normal range of motion. No erythema.     Right lower leg: No edema.     Left lower leg: No edema.  Lymphadenopathy:     Cervical: No cervical adenopathy.  Skin:    General: Skin is warm and dry.     Findings: No rash.  Neurological:     Mental Status: She is alert and oriented to person, place, and time.     Cranial Nerves: No cranial nerve deficit.     Sensory: No sensory deficit.     Deep Tendon Reflexes: Reflexes are normal and symmetric.  Psychiatric:        Attention and Perception: Attention normal.        Mood and Affect: Mood normal.    Wt Readings from Last 3 Encounters:  01/14/21 122 lb (55.3 kg)  12/02/20 119 lb (54 kg)  11/25/20 121 lb (54.9 kg)    BP 110/72   Pulse 67   Ht 5\' 3"  (1.6 m)   Wt 122 lb (55.3 kg)   SpO2 97%   BMI 21.61 kg/m   Assessment and Plan: 1. Annual physical exam Normal exam Up to date on screenings and immunizations.  2. Essential (primary) hypertension Clinically stable exam with well controlled BP. Tolerating medications without side effects at this time. Pt to continue current regimen and low sodium diet; benefits of regular exercise as able discussed. - CBC with Differential/Platelet - Comprehensive metabolic panel - TSH  3. Acute cystitis without hematuria Continue fluids and AZO if helpful Treat presumptively and also confirm with culture Diflucan given as well for recurrent candida vaginitis - POC urinalysis w microscopic (non auto) - nitrofurantoin,  macrocrystal-monohydrate, (MACROBID) 100 MG capsule; Take 1 capsule (100 mg total) by mouth 2 (two) times daily for 7 days.  Dispense: 14 capsule; Refill: 0 - Urine Culture  4. Hyperlipidemia, mixed Tolerating statin  medication without side effects at this time LDL is at goal of < 70 on current dose Continue same therapy without change at this time. - Lipid panel  5. Chronic nausea Much improved with no apparent cause Continue PRN phenergan - promethazine (PHENERGAN) 12.5 MG tablet; Take 1 tablet (12.5 mg total) by mouth every 8 (eight) hours as needed for up to 5 days for nausea or vomiting.  Dispense: 30 tablet; Refill: 0  6. AF (paroxysmal atrial fibrillation) (HCC) One episode per patient with no further sx On Pradaxa without any bleeding issues  7. Environmental and seasonal allergies - fluticasone (FLONASE) 50 MCG/ACT nasal spray; SHAKE LIQUID AND USE 2 SPRAYS IN EACH NOSTRIL DAILY AS NEEDED FOR ALLERGIES OR RHINITIS  Dispense: 48 g; Refill: 3   Partially dictated using Editor, commissioning. Any errors are unintentional.  Halina Maidens, MD [facility]  01/14/2021

## 2021-01-15 LAB — LIPID PANEL
Chol/HDL Ratio: 2.1 ratio (ref 0.0–4.4)
Cholesterol, Total: 208 mg/dL — ABNORMAL HIGH (ref 100–199)
HDL: 98 mg/dL (ref >39)
LDL Chol Calc (NIH): 91 mg/dL (ref 0–99)
Triglycerides: 113 mg/dL (ref 0–149)
VLDL Cholesterol Cal: 19 mg/dL (ref 5–40)

## 2021-01-15 LAB — CBC WITH DIFFERENTIAL/PLATELET
Basophils Absolute: 0 10*3/uL (ref 0.0–0.2)
Basos: 0 %
EOS (ABSOLUTE): 0.3 10*3/uL (ref 0.0–0.4)
Eos: 3 %
Hematocrit: 43 % (ref 34.0–46.6)
Hemoglobin: 13.9 g/dL (ref 11.1–15.9)
Immature Grans (Abs): 0 10*3/uL (ref 0.0–0.1)
Immature Granulocytes: 0 %
Lymphocytes Absolute: 1.5 10*3/uL (ref 0.7–3.1)
Lymphs: 16 %
MCH: 28.9 pg (ref 26.6–33.0)
MCHC: 32.3 g/dL (ref 31.5–35.7)
MCV: 89 fL (ref 79–97)
Monocytes Absolute: 0.7 10*3/uL (ref 0.1–0.9)
Monocytes: 7 %
Neutrophils Absolute: 7.1 10*3/uL — ABNORMAL HIGH (ref 1.4–7.0)
Neutrophils: 74 %
Platelets: 235 10*3/uL (ref 150–450)
RBC: 4.81 x10E6/uL (ref 3.77–5.28)
RDW: 13.2 % (ref 11.7–15.4)
WBC: 9.7 10*3/uL (ref 3.4–10.8)

## 2021-01-15 LAB — COMPREHENSIVE METABOLIC PANEL
ALT: 15 IU/L (ref 0–32)
AST: 24 IU/L (ref 0–40)
Albumin/Globulin Ratio: 1.6 (ref 1.2–2.2)
Albumin: 4.7 g/dL — ABNORMAL HIGH (ref 3.6–4.6)
Alkaline Phosphatase: 87 IU/L (ref 44–121)
BUN/Creatinine Ratio: 24 (ref 12–28)
BUN: 20 mg/dL (ref 8–27)
Bilirubin Total: 0.5 mg/dL (ref 0.0–1.2)
CO2: 26 mmol/L (ref 20–29)
Calcium: 9.8 mg/dL (ref 8.7–10.3)
Chloride: 99 mmol/L (ref 96–106)
Creatinine, Ser: 0.84 mg/dL (ref 0.57–1.00)
Globulin, Total: 2.9 g/dL (ref 1.5–4.5)
Glucose: 91 mg/dL (ref 70–99)
Potassium: 4 mmol/L (ref 3.5–5.2)
Sodium: 141 mmol/L (ref 134–144)
Total Protein: 7.6 g/dL (ref 6.0–8.5)
eGFR: 68 mL/min/{1.73_m2} (ref >59)

## 2021-01-15 LAB — TSH: TSH: 5.44 u[IU]/mL — ABNORMAL HIGH (ref 0.450–4.500)

## 2021-01-16 DIAGNOSIS — J479 Bronchiectasis, uncomplicated: Secondary | ICD-10-CM | POA: Diagnosis not present

## 2021-01-23 LAB — URINE CULTURE

## 2021-01-25 ENCOUNTER — Other Ambulatory Visit: Payer: Self-pay | Admitting: Internal Medicine

## 2021-01-25 ENCOUNTER — Telehealth: Payer: Self-pay | Admitting: Internal Medicine

## 2021-01-25 DIAGNOSIS — N3000 Acute cystitis without hematuria: Secondary | ICD-10-CM

## 2021-01-25 MED ORDER — CEFUROXIME AXETIL 250 MG PO TABS: 250.0000 mg | ORAL_TABLET | Freq: Two times a day (BID) | ORAL | 0 refills | Status: AC

## 2021-01-25 NOTE — Telephone Encounter (Signed)
Pt read her mychart message. Pt states she is still having urinary symptoms, so figures she will need another abx.  [facility] [place], [place]

## 2021-01-26 ENCOUNTER — Other Ambulatory Visit: Payer: Self-pay

## 2021-01-26 ENCOUNTER — Ambulatory Visit: Payer: Medicare PPO | Admitting: Physical Therapy

## 2021-02-03 DIAGNOSIS — C44311 Basal cell carcinoma of skin of nose: Secondary | ICD-10-CM | POA: Diagnosis not present

## 2021-02-03 DIAGNOSIS — L400 Psoriasis vulgaris: Secondary | ICD-10-CM | POA: Diagnosis not present

## 2021-02-03 DIAGNOSIS — Z79899 Other long term (current) drug therapy: Secondary | ICD-10-CM | POA: Diagnosis not present

## 2021-02-03 DIAGNOSIS — X32XXXA Exposure to sunlight, initial encounter: Secondary | ICD-10-CM | POA: Diagnosis not present

## 2021-02-03 DIAGNOSIS — L570 Actinic keratosis: Secondary | ICD-10-CM | POA: Diagnosis not present

## 2021-02-03 DIAGNOSIS — D485 Neoplasm of uncertain behavior of skin: Secondary | ICD-10-CM | POA: Diagnosis not present

## 2021-02-03 DIAGNOSIS — L821 Other seborrheic keratosis: Secondary | ICD-10-CM | POA: Diagnosis not present

## 2021-02-04 DIAGNOSIS — Z79899 Other long term (current) drug therapy: Secondary | ICD-10-CM | POA: Diagnosis not present

## 2021-02-10 ENCOUNTER — Ambulatory Visit: Payer: Medicare PPO

## 2021-02-17 ENCOUNTER — Ambulatory Visit (INDEPENDENT_AMBULATORY_CARE_PROVIDER_SITE_OTHER): Payer: Medicare PPO

## 2021-02-17 DIAGNOSIS — Z0000 Encounter for general adult medical examination without abnormal findings: Secondary | ICD-10-CM

## 2021-02-17 NOTE — Patient Instructions (Signed)
[name] , Thank you for taking time to come for your Medicare Wellness Visit. I appreciate your ongoing commitment to your health goals. Please review the following plan we discussed and let me know if I can assist you in the future.   Screening recommendations/referrals: Colonoscopy: no longer required Mammogram: done 12/25/20 Bone Density: no longer required Recommended yearly ophthalmology/optometry visit for glaucoma screening and checkup Recommended yearly dental visit for hygiene and checkup  Vaccinations: Influenza vaccine: done 12/11/20 Pneumococcal vaccine: done 11/22/13 Tdap vaccine: due Shingles vaccine: done 112/18 & 05/17/17   Covid-19:done 05/06/19, 05/26/19, 12/04/19 & 12/11/20  Advanced directives: Advance directive discussed with you today. I have provided a copy for you to complete at home and have notarized. Once this is complete please bring a copy in to our office so we can scan it into your chart.   Conditions/risks identified: Recommend continuing fall prevention in the home.   Next appointment: Follow up in one year for your annual wellness visit    Preventive Care 65 Years and Older, Female Preventive care refers to lifestyle choices and visits with your health care provider that can promote health and wellness. What does preventive care include? A yearly physical exam. This is also called an annual well check. Dental exams once or twice a year. Routine eye exams. Ask your health care provider how often you should have your eyes checked. Personal lifestyle choices, including: Daily care of your teeth and gums. Regular physical activity. Eating a healthy diet. Avoiding tobacco and drug use. Limiting alcohol use. Practicing safe sex. Taking low-dose aspirin every day. Taking vitamin and mineral supplements as recommended by your health care provider. What happens during an annual well check? The services and screenings done by your health care provider during  your annual well check will depend on your age, overall health, lifestyle risk factors, and family history of disease. Counseling  Your health care provider may ask you questions about your: Alcohol use. Tobacco use. Drug use. Emotional well-being. Home and relationship well-being. Sexual activity. Eating habits. History of falls. Memory and ability to understand (cognition). Work and work Statistician. Reproductive health. Screening  You may have the following tests or measurements: Height, weight, and BMI. Blood pressure. Lipid and cholesterol levels. These may be checked every 5 years, or more frequently if you are over 85 years old. Skin check. Lung cancer screening. You may have this screening every year starting at age 44 if you have a 30-pack-year history of smoking and currently smoke or have quit within the past 15 years. Fecal occult blood test (FOBT) of the stool. You may have this test every year starting at age 44. Flexible sigmoidoscopy or colonoscopy. You may have a sigmoidoscopy every 5 years or a colonoscopy every 10 years starting at age 74. Hepatitis C blood test. Hepatitis B blood test. Sexually transmitted disease (STD) testing. Diabetes screening. This is done by checking your blood sugar (glucose) after you have not eaten for a while (fasting). You may have this done every 1-3 years. Bone density scan. This is done to screen for osteoporosis. You may have this done starting at age 81. Mammogram. This may be done every 1-2 years. Talk to your health care provider about how often you should have regular mammograms. Talk with your health care provider about your test results, treatment options, and if necessary, the need for more tests. Vaccines  Your health care provider may recommend certain vaccines, such as: Influenza vaccine. This is recommended every  year. Tetanus, diphtheria, and acellular pertussis (Tdap, Td) vaccine. You may need a Td booster every 10  years. Zoster vaccine. You may need this after age 44. Pneumococcal 13-valent conjugate (PCV13) vaccine. One dose is recommended after age 26. Pneumococcal polysaccharide (PPSV23) vaccine. One dose is recommended after age 37. Talk to your health care provider about which screenings and vaccines you need and how often you need them. This information is not intended to replace advice given to you by your health care provider. Make sure you discuss any questions you have with your health care provider. Document Released: 03/20/2015 Document Revised: 11/11/2015 Document Reviewed: 12/23/2014 Elsevier Interactive Patient Education  2017 [employer] Prevention in the Home Falls can cause injuries. They can happen to people of all ages. There are many things you can do to make your home safe and to help prevent falls. What can I do on the outside of my home? Regularly fix the edges of walkways and driveways and fix any cracks. Remove anything that might make you trip as you walk through a door, such as a raised step or threshold. Trim any bushes or trees on the path to your home. Use bright outdoor lighting. Clear any walking paths of anything that might make someone trip, such as rocks or tools. Regularly check to see if handrails are loose or broken. Make sure that both sides of any steps have handrails. Any raised decks and porches should have guardrails on the edges. Have any leaves, snow, or ice cleared regularly. Use sand or salt on walking paths during winter. Clean up any spills in your garage right away. This includes oil or grease spills. What can I do in the bathroom? Use night lights. Install grab bars by the toilet and in the tub and shower. Do not use towel bars as grab bars. Use non-skid mats or decals in the tub or shower. If you need to sit down in the shower, use a plastic, non-slip stool. Keep the floor dry. Clean up any water that spills on the floor as soon as it  happens. Remove soap buildup in the tub or shower regularly. Attach bath mats securely with double-sided non-slip rug tape. Do not have throw rugs and other things on the floor that can make you trip. What can I do in the bedroom? Use night lights. Make sure that you have a light by your bed that is easy to reach. Do not use any sheets or blankets that are too big for your bed. They should not hang down onto the floor. Have a firm chair that has side arms. You can use this for support while you get dressed. Do not have throw rugs and other things on the floor that can make you trip. What can I do in the kitchen? Clean up any spills right away. Avoid walking on wet floors. Keep items that you use a lot in easy-to-reach places. If you need to reach something above you, use a strong step stool that has a grab bar. Keep electrical cords out of the way. Do not use floor polish or wax that makes floors slippery. If you must use wax, use non-skid floor wax. Do not have throw rugs and other things on the floor that can make you trip. What can I do with my stairs? Do not leave any items on the stairs. Make sure that there are handrails on both sides of the stairs and use them. Fix handrails that are broken or  loose. Make sure that handrails are as long as the stairways. Check any carpeting to make sure that it is firmly attached to the stairs. Fix any carpet that is loose or worn. Avoid having throw rugs at the top or bottom of the stairs. If you do have throw rugs, attach them to the floor with carpet tape. Make sure that you have a light switch at the top of the stairs and the bottom of the stairs. If you do not have them, ask someone to add them for you. What else can I do to help prevent falls? Wear shoes that: Do not have high heels. Have rubber bottoms. Are comfortable and fit you well. Are closed at the toe. Do not wear sandals. If you use a stepladder: Make sure that it is fully opened.  Do not climb a closed stepladder. Make sure that both sides of the stepladder are locked into place. Ask someone to hold it for you, if possible. Clearly mark and make sure that you can see: Any grab bars or handrails. First and last steps. Where the edge of each step is. Use tools that help you move around (mobility aids) if they are needed. These include: Canes. Walkers. Scooters. Crutches. Turn on the lights when you go into a dark area. Replace any light bulbs as soon as they burn out. Set up your furniture so you have a clear path. Avoid moving your furniture around. If any of your floors are uneven, fix them. If there are any pets around you, be aware of where they are. Review your medicines with your doctor. Some medicines can make you feel dizzy. This can increase your chance of falling. Ask your doctor what other things that you can do to help prevent falls. This information is not intended to replace advice given to you by your health care provider. Make sure you discuss any questions you have with your health care provider. Document Released: 12/18/2008 Document Revised: 07/30/2015 Document Reviewed: 03/28/2014 Elsevier Interactive Patient Education  2017 [employer].

## 2021-02-17 NOTE — Progress Notes (Signed)
Subjective:   [name] is a 87 y.o. female who presents for Medicare Annual (Subsequent) preventive examination.  Virtual Visit via Telephone Note  I connected with  [name] on 02/17/21 at  8:00 AM EST by telephone and verified that I am speaking with the correct person using two identifiers.  Location: Patient: home Provider: [facility] Persons participating in the virtual visit: [place]   I discussed the limitations, risks, security and privacy concerns of performing an evaluation and management service by telephone and the availability of in person appointments. The patient expressed understanding and agreed to proceed.  Interactive audio and video telecommunications were attempted between this nurse and patient, however failed, due to patient having technical difficulties OR patient did not have access to video capability.  We continued and completed visit with audio only.  Some vital signs may be absent or patient reported.   Clemetine Marker, LPN   Review of Systems     Cardiac Risk Factors include: advanced age (>57men, >91 women);dyslipidemia;hypertension     Objective:    There were no vitals filed for this visit. There is no height or weight on file to calculate BMI.  Advanced Directives 02/17/2021 08/16/2020 07/09/2020 07/04/2020 07/04/2020 06/19/2020 06/05/2020  Does Patient Have a Medical Advance Directive? No No No No No No No  Would patient like information on creating a medical advance directive? Yes (MAU/Ambulatory/Procedural Areas - Information given) - No - Patient declined No - Patient declined - Yes (Inpatient - patient requests chaplain consult to create a medical advance directive) No - Patient declined    Current Medications (verified) Outpatient Encounter Medications as of 02/17/2021  Medication Sig   atorvastatin (LIPITOR) 20 MG tablet Take 1 tablet (20 mg total) by mouth daily.   cetirizine (ZYRTEC) 10 MG tablet Take 1 tablet (10 mg  total) by mouth daily.   dabigatran (PRADAXA) 150 MG CAPS capsule Take 1 capsule (150 mg total) by mouth 2 (two) times daily.   famotidine (PEPCID) 10 MG tablet Take 10 mg by mouth daily.   fluticasone (FLONASE) 50 MCG/ACT nasal spray SHAKE LIQUID AND USE 2 SPRAYS IN EACH NOSTRIL DAILY AS NEEDED FOR ALLERGIES OR RHINITIS   hydrochlorothiazide (MICROZIDE) 12.5 MG capsule Take 12.5 mg by mouth daily.   methotrexate (RHEUMATREX) 2.5 MG tablet Take 2.5 mg by mouth once a week. Caution:Chemotherapy. Protect from light.   metoprolol tartrate (LOPRESSOR) 25 MG tablet Take 0.5 tablets (12.5 mg total) by mouth 2 (two) times daily.   OXYGEN Inhale into the lungs. 2 lpm [place] nightly   triamcinolone (KENALOG) 0.025 % cream Apply 1 application topically 2 (two) times daily. To external genitalia   [DISCONTINUED] promethazine (PHENERGAN) 12.5 MG tablet Take 1 tablet (12.5 mg total) by mouth every 8 (eight) hours as needed for up to 5 days for nausea or vomiting.   No facility-administered encounter medications on file as of 02/17/2021.    Allergies (verified) Amoxicillin, Eliquis [apixaban], and Xarelto [rivaroxaban]   History: Past Medical History:  Diagnosis Date   Acute metabolic encephalopathy 02/63/7858   Allergy    Bronchiectasis (HCC)    Cancer (HCC)    left breast cancer    COPD (chronic obstructive pulmonary disease) (HCC)    GERD (gastroesophageal reflux disease)    Hyperlipemia    Hypertension    Intractable nausea and vomiting 06/20/2018   Past Surgical History:  Procedure Laterality Date   BREAST LUMPECTOMY Left 2008   CHOLECYSTECTOMY  HIP PINNING,CANNULATED Left 06/05/2020   Procedure: CANNULATED HIP PINNING;  Surgeon: Hessie Knows, MD;  Location: [facility] ORS;  Service: Orthopedics;  Laterality: Left;   PARTIAL HYSTERECTOMY     TEE WITHOUT CARDIOVERSION N/A 06/10/2020   Procedure: TRANSESOPHAGEAL ECHOCARDIOGRAM (TEE);  Surgeon: Kate Sable, MD;  Location: [facility] ORS;  Service:  Cardiovascular;  Laterality: N/A;   Family History  Problem Relation Age of Onset   Breast cancer Sister    Social History   Socioeconomic History   Marital status: Married    Spouse name: Not on file   Number of children: 2   Years of education: Not on file   Highest education level: Not on file  Occupational History   Not on file  Tobacco Use   Smoking status: Never   Smokeless tobacco: Never  Vaping Use   Vaping Use: Never used  Substance and Sexual Activity   Alcohol use: No   Drug use: No   Sexual activity: Not on file  Other Topics Concern   Not on file  Social History Narrative   Not on file   Social Determinants of Health   Financial Resource Strain: Low Risk    Difficulty of Paying Living Expenses: Not hard at all  Food Insecurity: No Food Insecurity   Worried About Estate manager/land agent of Food in the Last Year: Never true   [place] in the Last Year: Never true  Transportation Needs: No Transportation Needs   Lack of Transportation (Medical): No   Lack of Transportation (Non-Medical): No  Physical Activity: Sufficiently Active   Days of Exercise per Week: 7 days   Minutes of Exercise per Session: 30 min  Stress: No Stress Concern Present   Feeling of Stress : Not at all  Social Connections: Moderately Isolated   Frequency of Communication with Friends and Family: More than three times a week   Frequency of Social Gatherings with Friends and Family: Once a week   Attends Religious Services: Never   Marine scientist or Organizations: No   Attends Music therapist: Never   Marital Status: Married    Tobacco Counseling Counseling given: Not Answered   Clinical Intake:  Pre-visit preparation completed: Yes  Pain : No/denies pain     Nutritional Risks: None Diabetes: No  How often do you need to have someone help you when you read instructions, pamphlets, or other written materials from your doctor or pharmacy?: 1 -  Never   Interpreter Needed?: No  Information entered by :: Clemetine Marker LPN   Activities of Daily Living In your present state of health, do you have any difficulty performing the following activities: 02/17/2021 01/14/2021  Hearing? N N  Vision? N N  Difficulty concentrating or making decisions? N N  Walking or climbing stairs? Y Y  Dressing or bathing? N N  Doing errands, shopping? Tempie Donning  Preparing Food and eating ? N -  Using the Toilet? N -  In the past six months, have you accidently leaked urine? Y -  Comment wears liners for protection -  Do you have problems with loss of bowel control? N -  Managing your Medications? N -  Managing your Finances? N -  Housekeeping or managing your Housekeeping? N -  Some recent data might be hidden    Patient Care Team: Glean Hess, MD as PCP - General (Internal Medicine) End, Harrell Gave, MD as PCP - Cardiology (Cardiology) Dasher, Rayvon Char, MD (Dermatology) Raul Del,  Freda Munro, MD as Referring Physician (Specialist)  Indicate any recent Medical Services you may have received from other than [facility] providers in the past year (date may be approximate).     Assessment:   This is a routine wellness examination for [name].  Hearing/Vision screen Hearing Screening - Comments:: Pt denies hearing difficulty Vision Screening - Comments:: Annual vision screenings at [facility] issues and exercise activities discussed: Current Exercise Habits: Home exercise routine, Type of exercise: walking, Time (Minutes): 30, Frequency (Times/Week): 7, Weekly Exercise (Minutes/Week): 210, Intensity: Mild, Exercise limited by: orthopedic condition(s)   Goals Addressed             This Visit's Progress    DIET - INCREASE WATER INTAKE       Recommend drinking 6-8 glasses of water per day        Depression Screen PHQ 2/9 Scores 02/17/2021 01/14/2021 11/25/2020 11/11/2020 10/20/2020 07/15/2020 06/30/2020  PHQ - 2 Score 0 0 0 0 0 0 0   PHQ- 9 Score 2 0 6 0 9 5 4     Fall Risk Fall Risk  02/17/2021 01/14/2021 11/25/2020 11/11/2020 10/20/2020  Falls in the past year? 1 1 1 1 1   Number falls in past yr: 1 1 0 0 0  Injury with Fall? 1 1 0 1 1  Comment - - - - -  Risk for fall due to : History of fall(s);Impaired balance/gait History of fall(s);Impaired balance/gait No Fall Risks History of fall(s) Impaired balance/gait;History of fall(s)  Follow up Falls prevention discussed Falls evaluation completed Falls evaluation completed Falls evaluation completed Falls evaluation completed    [place]:  Any stairs in or around the home? Yes  If so, are there any without handrails? No  Home free of loose throw rugs in walkways, pet beds, electrical cords, etc? Yes  Adequate lighting in your home to reduce risk of falls? Yes   ASSISTIVE DEVICES UTILIZED TO PREVENT FALLS:  Life alert? No  Use of a cane, walker or w/c? No  Grab bars in the bathroom? Yes  Shower chair or bench in shower? Yes  Elevated toilet seat or a handicapped toilet? Yes   TIMED UP AND GO:  Was the test performed? No . Telephonic visit.   Cognitive Function: Normal cognitive status assessed by direct observation by this Nurse Health Advisor. No abnormalities found.       6CIT Screen 02/10/2020 12/31/2018 12/29/2016 12/29/2015  What Year? 0 points 0 points 0 points 0 points  What month? 0 points 0 points 0 points 0 points  What time? 0 points 0 points 0 points 0 points  Count back from 20 0 points 0 points 0 points 0 points  Months in reverse 0 points 0 points 0 points 0 points  Repeat phrase 0 points 0 points 0 points 0 points  Total Score 0 0 0 0    Immunizations Immunization History  Administered Date(s) Administered   Influenza Inj Mdck Quad Pf 12/15/2017   Influenza,inj,Quad PF,6+ Mos 11/25/2014, 11/21/2019   Influenza-Unspecified 11/25/2016, 11/23/2018, 12/11/2020   PFIZER(Purple Top)SARS-COV-2 Vaccination  05/06/2019, 05/26/2019, 12/04/2019   Pfizer Covid-19 Vaccine Bivalent Booster 46yrs & up 12/11/2020   Pneumococcal Conjugate-13 11/22/2013   Pneumococcal Polysaccharide-23 03/08/2000   Tdap 07/06/2007   Zoster Recombinat (Shingrix) 01/06/2017, 05/17/2017   Zoster, Live 03/08/2005    TDAP status: Due, Education has been provided regarding the importance of this vaccine. Advised may receive this vaccine at  local pharmacy or Health Dept. Aware to provide a copy of the vaccination record if obtained from local pharmacy or Health Dept. Verbalized acceptance and understanding.  Flu Vaccine status: Up to date  Pneumococcal vaccine status: Up to date  Covid-19 vaccine status: Completed vaccines  Qualifies for Shingles Vaccine? Yes   Zostavax completed Yes   Shingrix Completed?: Yes  Screening Tests Health Maintenance  Topic Date Due   Pneumonia Vaccine 100+ Years old (3 - PPSV23 if available, else PCV20) 11/23/2014   TETANUS/TDAP  07/05/2017   INFLUENZA VACCINE  Completed   COVID-19 Vaccine  Completed   Zoster Vaccines- Shingrix  Completed   DEXA SCAN  Addressed   HPV VACCINES  Aged Out    Health Maintenance  Health Maintenance Due  Topic Date Due   Pneumonia Vaccine 88+ Years old (39 - PPSV23 if available, else PCV20) 11/23/2014   TETANUS/TDAP  07/05/2017    Colorectal cancer screening: No longer required.   Mammogram status: Completed 12/25/20. Repeat every year  Bone density screening: no longer required  Lung Cancer Screening: (Low Dose CT Chest recommended if Age 79-80 years, 30 pack-year currently smoking OR have quit w/in 15years.) does not qualify.   Additional Screening:  Hepatitis C Screening: does not qualify  Vision Screening: Recommended annual ophthalmology exams for early detection of glaucoma and other disorders of the eye. Is the patient up to date with their annual eye exam?  Yes  Who is the provider or what is the name of the office in which the patient  attends annual eye exams? Dr. Ellin Mayhew.   Dental Screening: Recommended annual dental exams for proper oral hygiene  Community Resource Referral / Chronic Care Management: CRR required this visit?  No   CCM required this visit?  No      Plan:     I have personally reviewed and noted the following in the patients chart:   Medical and social history Use of alcohol, tobacco or illicit drugs  Current medications and supplements including opioid prescriptions.  Functional ability and status Nutritional status Physical activity Advanced directives List of other physicians Hospitalizations, surgeries, and ER visits in previous 12 months Vitals Screenings to include cognitive, depression, and falls Referrals and appointments  In addition, I have reviewed and discussed with patient certain preventive protocols, quality metrics, and best practice recommendations. A written personalized care plan for preventive services as well as general preventive health recommendations were provided to patient.     Clemetine Marker, LPN   [id number]/15/0569   Nurse Notes: none

## 2021-02-22 ENCOUNTER — Encounter: Payer: Self-pay | Admitting: Pulmonary Disease

## 2021-03-05 NOTE — Progress Notes (Deleted)
Cardiology Office Note:    Date:  03/05/2021   ID:  [name], DOB 03-09-1933, MRN [id number]  PCP:  Glean Hess, MD  [facility] Cardiologist:  Nelva Bush, MD  [facility] Electrophysiologist:  None   Referring MD: Glean Hess, MD   Chief Complaint: 3 month follow-up  History of Present Illness:    [name] is a 87 y.o. female with a hx of ***  Past Medical History:  Diagnosis Date   Acute metabolic encephalopathy 03/47/4259   Allergy    Bronchiectasis ([place])    Cancer ([place])    left breast cancer    COPD (chronic obstructive pulmonary disease) ([place])    GERD (gastroesophageal reflux disease)    Hyperlipemia    Hypertension    Intractable nausea and vomiting 06/20/2018    Past Surgical History:  Procedure Laterality Date   BREAST LUMPECTOMY Left 2008   CHOLECYSTECTOMY     HIP PINNING,CANNULATED Left 06/05/2020   Procedure: CANNULATED HIP PINNING;  Surgeon: Hessie Knows, MD;  Location: [facility] ORS;  Service: Orthopedics;  Laterality: Left;   PARTIAL HYSTERECTOMY     TEE WITHOUT CARDIOVERSION N/A 06/10/2020   Procedure: TRANSESOPHAGEAL ECHOCARDIOGRAM (TEE);  Surgeon: Kate Sable, MD;  Location: [facility] ORS;  Service: Cardiovascular;  Laterality: N/A;    Current Medications: No outpatient medications have been marked as taking for the 03/11/21 encounter (Appointment) with Kathlen Mody, Clennon Nasca H, PA-C.     Allergies:   Amoxicillin, Eliquis [apixaban], and Xarelto [rivaroxaban]   Social History   Socioeconomic History   Marital status: Married    Spouse name: Not on file   Number of children: 2   Years of education: Not on file   Highest education level: Not on file  Occupational History   Not on file  Tobacco Use   Smoking status: Never   Smokeless tobacco: Never  Vaping Use   Vaping Use: Never used  Substance and Sexual Activity   Alcohol use: No   Drug use: No   Sexual activity: Not on file  Other Topics Concern   Not on file   Social History Narrative   Not on file   Social Determinants of Health   Financial Resource Strain: Low Risk    Difficulty of Paying Living Expenses: Not hard at all  Food Insecurity: No Food Insecurity   Worried About Charity fundraiser in the Last Year: Never true   [place] in the Last Year: Never true  Transportation Needs: No Transportation Needs   Lack of Transportation (Medical): No   Lack of Transportation (Non-Medical): No  Physical Activity: Sufficiently Active   Days of Exercise per Week: 7 days   Minutes of Exercise per Session: 30 min  Stress: No Stress Concern Present   Feeling of Stress : Not at all  Social Connections: Moderately Isolated   Frequency of Communication with Friends and Family: More than three times a week   Frequency of Social Gatherings with Friends and Family: Once a week   Attends Religious Services: Never   Marine scientist or Organizations: No   Attends Music therapist: Never   Marital Status: Married     Family History: The patient's ***family history includes Breast cancer in her sister.  ROS:   Please see the history of present illness.    *** All other systems reviewed and are negative.  EKGs/Labs/Other Studies Reviewed:    The following studies were reviewed today: ***  EKG:  EKG is *** ordered today.  The ekg ordered today demonstrates ***  Recent Labs: 06/09/2020: B Natriuretic Peptide 185.9 06/23/2020: Magnesium 2.1 01/14/2021: ALT 15; BUN 20; Creatinine, Ser 0.84; Hemoglobin 13.9; Platelets 235; Potassium 4.0; Sodium 141; TSH 5.440  Recent Lipid Panel    Component Value Date/Time   CHOL 208 (H) 01/14/2021 0855   CHOL 280 (H) 10/08/2013 0516   TRIG 113 01/14/2021 0855   TRIG 112 10/08/2013 0516   HDL 98 01/14/2021 0855   HDL 106 (H) 10/08/2013 0516   CHOLHDL 2.1 01/14/2021 0855   VLDL 22 10/08/2013 0516   LDLCALC 91 01/14/2021 0855   LDLCALC 152 (H) 10/08/2013 0516     Risk  Assessment/Calculations:   {Does this patient have ATRIAL FIBRILLATION?:[id number]}   Physical Exam:    VS:  There were no vitals taken for this visit.    Wt Readings from Last 3 Encounters:  01/14/21 122 lb (55.3 kg)  12/02/20 119 lb (54 kg)  11/25/20 121 lb (54.9 kg)     GEN: *** Well nourished, well developed in no acute distress HEENT: Normal NECK: No JVD; No carotid bruits LYMPHATICS: No lymphadenopathy CARDIAC: ***RRR, no murmurs, rubs, gallops RESPIRATORY:  Clear to auscultation without rales, wheezing or rhonchi  ABDOMEN: Soft, non-tender, non-distended MUSCULOSKELETAL:  No edema; No deformity  SKIN: Warm and dry NEUROLOGIC:  Alert and oriented x 3 PSYCHIATRIC:  Normal affect   ASSESSMENT:    No diagnosis found. PLAN:    In order of problems listed above:  ***  Disposition: Follow up {follow up:15908} with ***   Shared Decision Making/Informed Consent   {Are you ordering a CV Procedure (e.g. stress test, cath, DCCV, TEE, etc)?   Press F2        :[id number]}    Signed, Sarha Bartelt Ninfa Meeker, PA-C  03/05/2021 1:07 PM    [facility]

## 2021-03-11 ENCOUNTER — Ambulatory Visit: Payer: Medicare PPO | Admitting: Nurse Practitioner

## 2021-03-11 ENCOUNTER — Encounter: Payer: Self-pay | Admitting: Nurse Practitioner

## 2021-03-11 ENCOUNTER — Other Ambulatory Visit: Payer: Self-pay

## 2021-03-11 VITALS — BP 122/60 | HR 77 | Ht 63.0 in | Wt 122.0 lb

## 2021-03-11 DIAGNOSIS — R110 Nausea: Secondary | ICD-10-CM | POA: Diagnosis not present

## 2021-03-11 DIAGNOSIS — I2510 Atherosclerotic heart disease of native coronary artery without angina pectoris: Secondary | ICD-10-CM | POA: Diagnosis not present

## 2021-03-11 DIAGNOSIS — I5032 Chronic diastolic (congestive) heart failure: Secondary | ICD-10-CM | POA: Diagnosis not present

## 2021-03-11 DIAGNOSIS — I480 Paroxysmal atrial fibrillation: Secondary | ICD-10-CM | POA: Diagnosis not present

## 2021-03-11 DIAGNOSIS — E782 Mixed hyperlipidemia: Secondary | ICD-10-CM

## 2021-03-11 DIAGNOSIS — I10 Essential (primary) hypertension: Secondary | ICD-10-CM

## 2021-03-11 MED ORDER — RIVAROXABAN 15 MG PO TABS: 15.0000 mg | ORAL_TABLET | Freq: Every day | ORAL | 5 refills | Status: DC

## 2021-03-11 MED ORDER — HYDROCHLOROTHIAZIDE 12.5 MG PO CAPS: 12.5000 mg | ORAL_CAPSULE | Freq: Every day | ORAL | 3 refills | Status: DC

## 2021-03-11 NOTE — Progress Notes (Deleted)
Cardiology Office Note:    Date:  03/11/2021   ID:  [name], DOB [birth year], MRN [id number]  PCP:  Glean Hess, MD  [facility] Cardiologist:  Nelva Bush, MD  [facility] Electrophysiologist:  None   Referring MD: Glean Hess, MD   Chief Complaint: ***  History of Present Illness:    [name] is a 88 y.o. female with a hx of ***  Past Medical History:  Diagnosis Date   Acute metabolic encephalopathy 71/24/5809   Allergy    Bronchiectasis ([place])    Cancer ([place])    left breast cancer    COPD (chronic obstructive pulmonary disease) ([place])    GERD (gastroesophageal reflux disease)    Hyperlipemia    Hypertension    Intractable nausea and vomiting 06/20/2018    Past Surgical History:  Procedure Laterality Date   BREAST LUMPECTOMY Left 2008   CHOLECYSTECTOMY     HIP PINNING,CANNULATED Left 06/05/2020   Procedure: CANNULATED HIP PINNING;  Surgeon: Hessie Knows, MD;  Location: [facility] ORS;  Service: Orthopedics;  Laterality: Left;   PARTIAL HYSTERECTOMY     TEE WITHOUT CARDIOVERSION N/A 06/10/2020   Procedure: TRANSESOPHAGEAL ECHOCARDIOGRAM (TEE);  Surgeon: Kate Sable, MD;  Location: [facility] ORS;  Service: Cardiovascular;  Laterality: N/A;    Current Medications: Current Meds  Medication Sig   atorvastatin (LIPITOR) 20 MG tablet Take 1 tablet (20 mg total) by mouth daily.   cetirizine (ZYRTEC) 10 MG tablet Take 1 tablet (10 mg total) by mouth daily.   famotidine (PEPCID) 10 MG tablet Take 10 mg by mouth daily.   fluticasone (FLONASE) 50 MCG/ACT nasal spray SHAKE LIQUID AND USE 2 SPRAYS IN EACH NOSTRIL DAILY AS NEEDED FOR ALLERGIES OR RHINITIS   methotrexate (RHEUMATREX) 2.5 MG tablet Take 2.5 mg by mouth once a week. Caution:Chemotherapy. Protect from light.   metoprolol tartrate (LOPRESSOR) 25 MG tablet Take 0.5 tablets (12.5 mg total) by mouth 2 (two) times daily.   OXYGEN Inhale into the lungs. 2 lpm [place] nightly   Rivaroxaban (XARELTO) 15 MG  TABS tablet Take 1 tablet (15 mg total) by mouth daily.   triamcinolone (KENALOG) 0.025 % cream Apply 1 application topically 2 (two) times daily. To external genitalia   [DISCONTINUED] dabigatran (PRADAXA) 150 MG CAPS capsule Take 1 capsule (150 mg total) by mouth 2 (two) times daily.   [DISCONTINUED] hydrochlorothiazide (MICROZIDE) 12.5 MG capsule Take 12.5 mg by mouth daily.     Allergies:   Amoxicillin, Eliquis [apixaban], and Xarelto [rivaroxaban]   Social History   Socioeconomic History   Marital status: Married    Spouse name: Not on file   Number of children: 2   Years of education: Not on file   Highest education level: Not on file  Occupational History   Not on file  Tobacco Use   Smoking status: Never   Smokeless tobacco: Never  Vaping Use   Vaping Use: Never used  Substance and Sexual Activity   Alcohol use: No   Drug use: No   Sexual activity: Not on file  Other Topics Concern   Not on file  Social History Narrative   Not on file   Social Determinants of Health   Financial Resource Strain: Low Risk    Difficulty of Paying Living Expenses: Not hard at all  Food Insecurity: No Food Insecurity   Worried About [place] in the Last Year: Never true   [place] in the  Last Year: Never true  Transportation Needs: No Transportation Needs   Lack of Transportation (Medical): No   Lack of Transportation (Non-Medical): No  Physical Activity: Sufficiently Active   Days of Exercise per Week: 7 days   Minutes of Exercise per Session: 30 min  Stress: No Stress Concern Present   Feeling of Stress : Not at all  Social Connections: Moderately Isolated   Frequency of Communication with Friends and Family: More than three times a week   Frequency of Social Gatherings with Friends and Family: Once a week   Attends Religious Services: Never   Marine scientist or Organizations: No   Attends Music therapist: Never   Marital Status: Married      Family History: The patient's ***family history includes Breast cancer in her sister.  ROS:   Please see the history of present illness.    *** All other systems reviewed and are negative.  EKGs/Labs/Other Studies Reviewed:    The following studies were reviewed today: ***  EKG:  EKG is *** ordered today.  The ekg ordered today demonstrates ***  Recent Labs: 06/09/2020: B Natriuretic Peptide 185.9 06/23/2020: Magnesium 2.1 01/14/2021: ALT 15; BUN 20; Creatinine, Ser 0.84; Hemoglobin 13.9; Platelets 235; Potassium 4.0; Sodium 141; TSH 5.440  Recent Lipid Panel    Component Value Date/Time   CHOL 208 (H) 01/14/2021 0855   CHOL 280 (H) 10/08/2013 0516   TRIG 113 01/14/2021 0855   TRIG 112 10/08/2013 0516   HDL 98 01/14/2021 0855   HDL 106 (H) 10/08/2013 0516   CHOLHDL 2.1 01/14/2021 0855   VLDL 22 10/08/2013 0516   LDLCALC 91 01/14/2021 0855   LDLCALC 152 (H) 10/08/2013 0516     Risk Assessment/Calculations:   {Does this patient have ATRIAL FIBRILLATION?:[phone]}   Physical Exam:    VS:  BP 122/60 (BP Location: Left Arm, Patient Position: Sitting, Cuff Size: Normal)    Pulse 77    Ht 5\' 3"  (1.6 m)    Wt 122 lb (55.3 kg)    SpO2 94%    BMI 21.61 kg/m     Wt Readings from Last 3 Encounters:  03/11/21 122 lb (55.3 kg)  01/14/21 122 lb (55.3 kg)  12/02/20 119 lb (54 kg)     GEN: *** Well nourished, well developed in no acute distress HEENT: Normal NECK: No JVD; No carotid bruits LYMPHATICS: No lymphadenopathy CARDIAC: ***RRR, no murmurs, rubs, gallops RESPIRATORY:  Clear to auscultation without rales, wheezing or rhonchi  ABDOMEN: Soft, non-tender, non-distended MUSCULOSKELETAL:  No edema; No deformity  SKIN: Warm and dry NEUROLOGIC:  Alert and oriented x 3 PSYCHIATRIC:  Normal affect   ASSESSMENT:    1. Paroxysmal A-fib ([place])   2. Chronic diastolic heart failure ([place])   3. Coronary artery calcification seen on CT scan   4. Essential (primary)  hypertension   5. Mixed hyperlipidemia   6. Nausea    PLAN:    In order of problems listed above:  ***  Disposition: Follow up {follow up:15908} with ***   Shared Decision Making/Informed Consent   {Are you ordering a CV Procedure (e.g. stress test, cath, DCCV, TEE, etc)?   Press F2        :[id number]}    Signed, Ardie Dragoo Ninfa Meeker, PA-C  03/11/2021 4:40 PM    [facility]

## 2021-03-11 NOTE — Patient Instructions (Signed)
Medication Instructions:   Your physician has recommended you make the following change in your medication:   STOP Pradaxa  2.  START Xarelto - Take one tablet (15mg ) by mouth daily.    *If you need a refill on your cardiac medications before your next appointment, please call your pharmacy*   Lab Work:  None Ordered  If you have labs (blood work) drawn today and your tests are completely normal, you will receive your results only by: [place] (if you have MyChart) OR A paper copy in the mail If you have any lab test that is abnormal or we need to change your treatment, we will call you to review the results.   Testing/Procedures:  None Ordered  Follow-Up: At [facility], you and your health needs are our priority.  As part of our continuing mission to provide you with exceptional heart care, we have created designated Provider Care Teams.  These Care Teams include your primary Cardiologist (physician) and Advanced Practice Providers (APPs -  Physician Assistants and Nurse Practitioners) who all work together to provide you with the care you need, when you need it.  We recommend signing up for the patient portal called "MyChart".  Sign up information is provided on this After Visit Summary.  MyChart is used to connect with patients for Virtual Visits (Telemedicine).  Patients are able to view lab/test results, encounter notes, upcoming appointments, etc.  Non-urgent messages can be sent to your provider as well.   To learn more about what you can do with MyChart, go to [email or web address].    Your next appointment:   6 month(s)  The format for your next appointment:   In Person  Provider:   You may see Nelva Bush, MD or one of the following Advanced Practice Providers on your designated Care Team:   Murray Hodgkins, NP Christell Faith, PA-C Cadence Kathlen Mody, [place]

## 2021-03-11 NOTE — Progress Notes (Signed)
Office Visit    Patient Name: [name] Date of Encounter: 03/11/2021  Primary Care Provider:  Glean Hess, MD Primary Cardiologist:  Nelva Bush, MD  Chief Complaint    88 year old female with a history of atrial fibrillation, chronic diastolic heart failure, coronary calcium noted on CT, hypertension, hyperlipidemia, GERD, COPD, chronic nausea, and breast cancer s/p chemo and radiation who presents for follow-up related to atrial fibrillation.   Past Medical History    Past Medical History:  Diagnosis Date   Acute metabolic encephalopathy 82/99/3716   Allergy    Bronchiectasis ([place])    Cancer ([facility])    left breast cancer    COPD (chronic obstructive pulmonary disease) ([facility])    GERD (gastroesophageal reflux disease)    Hyperlipemia    Hypertension    Intractable nausea and vomiting 06/20/2018   Past Surgical History:  Procedure Laterality Date   BREAST LUMPECTOMY Left 2008   CHOLECYSTECTOMY     HIP PINNING,CANNULATED Left 06/05/2020   Procedure: CANNULATED HIP PINNING;  Surgeon: Hessie Knows, MD;  Location: [facility] ORS;  Service: Orthopedics;  Laterality: Left;   PARTIAL HYSTERECTOMY     TEE WITHOUT CARDIOVERSION N/A 06/10/2020   Procedure: TRANSESOPHAGEAL ECHOCARDIOGRAM (TEE);  Surgeon: Kate Sable, MD;  Location: [facility] ORS;  Service: Cardiovascular;  Laterality: N/A;    Allergies  Allergies  Allergen Reactions   Amoxicillin Nausea Only   Eliquis [Apixaban] Nausea Only   Xarelto [Rivaroxaban] Nausea Only    History of Present Illness    88 year old female with the above past medical history including atrial fibrillation, chronic diastolic heart failure, coronary calcium noted on CT, hypertension, hyperlipidemia, GERD, COPD, chronic nausea, and breast cancer s/p chemo and radiation.  She was diagnosed with atrial fibrillation in 06/2020 during a hospitalization for a L hip fracture. She was started on amiodarone and Eliquis and converted to NSR.  Chest CT at the time was negative for PE, but did show moderate calcification of thoracic aorta and stable coronary artery calcifications. Echo at the time showed EF 65-70%, no RWMA, mild LVH, G2DD, moderately elevated PASP/RVSP, mild LAE, degenerative mitral valve, and mild mitral stenosis. Amiodarone was subsequently discontinued in the setting of GI issues (persistent nausea) She also expressed concern that her Eliquis could be contributing to her GI symptoms. She was transitioned from Eliquis to Xarelto, and ultimately to Pradaxa. She was last seen in the office on 12/02/2020 and was tolerating Pradaxa. She follows with GI for persistent nausea.   She presents today for follow-up. Since her last visit she has done well overall from a cardiac standpoint.  She continues to have persistent nausea with rare vomiting.  She has followed closely with GI, and she states she was told that there was no explanation at this time for her symptoms. She is asking to switch back to Xarelto from Pradaxa.  She denies any cardiac symptoms or concerns today.  Home Medications    Current Outpatient Medications  Medication Sig Dispense Refill   atorvastatin (LIPITOR) 20 MG tablet Take 1 tablet (20 mg total) by mouth daily.     cetirizine (ZYRTEC) 10 MG tablet Take 1 tablet (10 mg total) by mouth daily.     famotidine (PEPCID) 10 MG tablet Take 10 mg by mouth daily.     fluticasone (FLONASE) 50 MCG/ACT nasal spray SHAKE LIQUID AND USE 2 SPRAYS IN EACH NOSTRIL DAILY AS NEEDED FOR ALLERGIES OR RHINITIS 48 g 3   methotrexate (RHEUMATREX) 2.5 MG tablet  Take 2.5 mg by mouth once a week. Caution:Chemotherapy. Protect from light.     metoprolol tartrate (LOPRESSOR) 25 MG tablet Take 0.5 tablets (12.5 mg total) by mouth 2 (two) times daily. 90 tablet 3   OXYGEN Inhale into the lungs. 2 lpm [place] nightly     Rivaroxaban (XARELTO) 15 MG TABS tablet Take 1 tablet (15 mg total) by mouth daily. 30 tablet 5   triamcinolone (KENALOG)  0.025 % cream Apply 1 application topically 2 (two) times daily. To external genitalia 30 g 1   hydrochlorothiazide (MICROZIDE) 12.5 MG capsule Take 1 capsule (12.5 mg total) by mouth daily. 90 capsule 3   No current facility-administered medications for this visit.     Review of Systems    She denies chest pain, palpitations, dyspnea, pnd, orthopnea, dizziness, syncope, edema, weight gain, or early satiety. All other systems reviewed and are otherwise negative except as noted above.    Physical Exam    VS:  BP 122/60 (BP Location: Left Arm, Patient Position: Sitting, Cuff Size: Normal)    Pulse 77    Ht 5\' 3"  (1.6 m)    Wt 122 lb (55.3 kg)    SpO2 94%    BMI 21.61 kg/m  GEN: Well nourished, well developed, in no acute distress. HEENT: normal. Neck: Supple, no JVD, carotid bruits, or masses. Cardiac: RRR, no murmurs, rubs, or gallops. No clubbing, cyanosis, edema.  Radials/DP/PT 2+ and equal bilaterally.  Respiratory:  Respirations regular and unlabored, clear to auscultation bilaterally. GI: Soft, nontender, nondistended, BS + x 4. MS: no deformity or atrophy. Skin: warm and dry, no rash. Neuro:  Strength and sensation are intact. Psych: Normal affect.  Accessory Clinical Findings    ECG personally reviewed by me today - Normal sinus rhythm, 77 bpm - no acute changes.  Lab Results  Component Value Date   WBC 9.7 01/14/2021   HGB 13.9 01/14/2021   HCT 43.0 01/14/2021   MCV 89 01/14/2021   PLT 235 01/14/2021   Lab Results  Component Value Date   CREATININE 0.84 01/14/2021   BUN 20 01/14/2021   NA 141 01/14/2021   K 4.0 01/14/2021   CL 99 01/14/2021   CO2 26 01/14/2021   Lab Results  Component Value Date   ALT 15 01/14/2021   AST 24 01/14/2021   ALKPHOS 87 01/14/2021   BILITOT 0.5 01/14/2021   Lab Results  Component Value Date   CHOL 208 (H) 01/14/2021   HDL 98 01/14/2021   LDLCALC 91 01/14/2021   TRIG 113 01/14/2021   CHOLHDL 2.1 01/14/2021    No results  found for: HGBA1C  Assessment & Plan    1. Paroxysmal atrial fibrillation: EKG today shows normal sinus rhythm.  She is requesting to switch from Pradaxa back to Xarelto.  Creatinine clearance today calculated at 34.55. We will restart Xarelto at 15 mg daily (reduced dose given creatinine clearance<50).  Continue metoprolol tartrate.  2. Chronic diastolic heart failure: Euvolemic and well compensated on exam.  Continue metoprolol, hydrochlorothiazide.  3. Coronary calcium noted on CT/calcification of thoracic aorta: Stable with no anginal symptoms. No indication for ischemic evaluation. Continue atorvastatin.   4. Hypertension: BP well controlled. Continue current antihypertensive regimen.   5. Hyperlipidemia: LDL 91 on 01/14/21. Continue statin.   6. Persistent nausea: Following with GI.   7. Disposition: Follow-up in 6 months.  Lenna Sciara, NP 03/11/2021, 4:01 PM

## 2021-03-22 ENCOUNTER — Other Ambulatory Visit: Payer: Self-pay

## 2021-03-22 ENCOUNTER — Encounter: Payer: Self-pay | Admitting: Internal Medicine

## 2021-03-22 ENCOUNTER — Ambulatory Visit: Payer: Medicare PPO | Admitting: Internal Medicine

## 2021-03-22 VITALS — BP 136/74 | HR 90 | Ht 63.0 in | Wt 123.6 lb

## 2021-03-22 DIAGNOSIS — I700 Atherosclerosis of aorta: Secondary | ICD-10-CM | POA: Diagnosis not present

## 2021-03-22 DIAGNOSIS — D6869 Other thrombophilia: Secondary | ICD-10-CM

## 2021-03-22 DIAGNOSIS — J449 Chronic obstructive pulmonary disease, unspecified: Secondary | ICD-10-CM | POA: Diagnosis not present

## 2021-03-22 DIAGNOSIS — R110 Nausea: Secondary | ICD-10-CM | POA: Diagnosis not present

## 2021-03-22 DIAGNOSIS — F411 Generalized anxiety disorder: Secondary | ICD-10-CM | POA: Diagnosis not present

## 2021-03-22 MED ORDER — PROMETHAZINE HCL 12.5 MG PO TABS: 12.5000 mg | ORAL_TABLET | Freq: Three times a day (TID) | ORAL | 0 refills | Status: DC | PRN

## 2021-03-22 MED ORDER — ATORVASTATIN CALCIUM 20 MG PO TABS: 20.0000 mg | ORAL_TABLET | Freq: Every day | ORAL | 1 refills | Status: DC

## 2021-03-22 MED ORDER — SERTRALINE HCL 50 MG PO TABS: 50.0000 mg | ORAL_TABLET | Freq: Every day | ORAL | 1 refills | Status: DC

## 2021-03-22 NOTE — Progress Notes (Signed)
Date:  03/22/2021   Name:  [name]   DOB:  1933/05/05   MRN:  [id number]   Chief Complaint: Emesis (With Nausea.)  HPI  Nausea - resolved for several months after a year long bout with chronic nausea.  She had been taking phenergan but had not needed it for a while.  Over the past few weeks the nausea has returned.  It is worse in the AM and is relieved partly by phenergan. No vomiting or diarrhea.  She does have some generalized anxiety that seems to be worse in the morning.  She is eating modestly and drinking sufficient fluids daily.  Her weight is stable.  Recent cardiac check up was good.  COPD O2 nightly - aggravated by the tubing; she wants to sleep on her side and it comes off easily.  No chronic inhalation therapy.  Aortic atherosclerosis - tolerating atorvastatin 20 mg daily.  Recent LDL = 91   Lab Results  Component Value Date   NA 141 01/14/2021   K 4.0 01/14/2021   CO2 26 01/14/2021   GLUCOSE 91 01/14/2021   BUN 20 01/14/2021   CREATININE 0.84 01/14/2021   CALCIUM 9.8 01/14/2021   EGFR 68 01/14/2021   GFRNONAA 53 (L) 08/16/2020   Lab Results  Component Value Date   CHOL 208 (H) 01/14/2021   HDL 98 01/14/2021   LDLCALC 91 01/14/2021   TRIG 113 01/14/2021   CHOLHDL 2.1 01/14/2021   Lab Results  Component Value Date   TSH 5.440 (H) 01/14/2021   No results found for: HGBA1C Lab Results  Component Value Date   WBC 9.7 01/14/2021   HGB 13.9 01/14/2021   HCT 43.0 01/14/2021   MCV 89 01/14/2021   PLT 235 01/14/2021   Lab Results  Component Value Date   ALT 15 01/14/2021   AST 24 01/14/2021   ALKPHOS 87 01/14/2021   BILITOT 0.5 01/14/2021   Lab Results  Component Value Date   VD25OH 32.34 06/20/2020     Review of Systems  Constitutional:  Negative for chills, diaphoresis, fatigue, fever and unexpected weight change.  Respiratory:  Negative for chest tightness, shortness of breath and wheezing.   Cardiovascular:  Negative for chest pain.   Gastrointestinal:  Positive for nausea. Negative for abdominal pain, constipation, diarrhea and vomiting.  Psychiatric/Behavioral:  Positive for sleep disturbance (using O2 [place] and it aggravates her). Negative for dysphoric mood. The patient is nervous/anxious.    Patient Active Problem List   Diagnosis Date Noted   Closed intracapsular fracture of left femur with routine healing 08/28/2020   Acquired thrombophilia ([place]) 07/15/2020   AF (paroxysmal atrial fibrillation) ([place]) 06/19/2020   Fracture of femoral neck, left, closed ([place]) 06/05/2020   Atherosclerosis of aorta ([place]) 01/09/2020   Stage 2 moderate COPD by GOLD classification ([place]) 01/09/2020   HSV (herpes simplex virus) infection 01/09/2020   BCC (basal cell carcinoma), face 10/12/2017   Environmental and seasonal allergies 10/30/2014   Psoriasis vulgaris 10/30/2014   Essential (primary) hypertension 10/30/2014   Gastro-esophageal reflux disease without esophagitis 10/30/2014   Hot flash, menopausal 10/30/2014   Hyperlipidemia, mixed 10/30/2014   History of cancer of left breast 10/30/2014   Bronchiectasis ([place]) 09/24/2013    Allergies  Allergen Reactions   Amoxicillin Nausea Only   Eliquis [Apixaban] Nausea Only   Xarelto [Rivaroxaban] Nausea Only    Past Surgical History:  Procedure Laterality Date   BREAST LUMPECTOMY Left 2008   CHOLECYSTECTOMY  HIP PINNING,CANNULATED Left 06/05/2020   Procedure: CANNULATED HIP PINNING;  Surgeon: Hessie Knows, MD;  Location: [facility] ORS;  Service: Orthopedics;  Laterality: Left;   PARTIAL HYSTERECTOMY     TEE WITHOUT CARDIOVERSION N/A 06/10/2020   Procedure: TRANSESOPHAGEAL ECHOCARDIOGRAM (TEE);  Surgeon: Kate Sable, MD;  Location: [facility] ORS;  Service: Cardiovascular;  Laterality: N/A;    Social History   Tobacco Use   Smoking status: Never   Smokeless tobacco: Never  Vaping Use   Vaping Use: Never used  Substance Use Topics   Alcohol use: No   Drug use: No      Medication list has been reviewed and updated.  Current Meds  Medication Sig   atorvastatin (LIPITOR) 20 MG tablet Take 1 tablet (20 mg total) by mouth daily.   cetirizine (ZYRTEC) 10 MG tablet Take 1 tablet (10 mg total) by mouth daily.   famotidine (PEPCID) 10 MG tablet Take 10 mg by mouth daily.   fluticasone (FLONASE) 50 MCG/ACT nasal spray SHAKE LIQUID AND USE 2 SPRAYS IN EACH NOSTRIL DAILY AS NEEDED FOR ALLERGIES OR RHINITIS   hydrochlorothiazide (MICROZIDE) 12.5 MG capsule Take 1 capsule (12.5 mg total) by mouth daily.   methotrexate (RHEUMATREX) 2.5 MG tablet Take 2.5 mg by mouth once a week. Caution:Chemotherapy. Protect from light.   metoprolol tartrate (LOPRESSOR) 25 MG tablet Take 0.5 tablets (12.5 mg total) by mouth 2 (two) times daily.   OXYGEN Inhale into the lungs. 2 lpm [place] nightly   Rivaroxaban (XARELTO) 15 MG TABS tablet Take 1 tablet (15 mg total) by mouth daily.   triamcinolone (KENALOG) 0.025 % cream Apply 1 application topically 2 (two) times daily. To external genitalia    PHQ 2/9 Scores 03/22/2021 02/17/2021 01/14/2021 11/25/2020  PHQ - 2 Score 4 0 0 0  PHQ- 9 Score 10 2 0 6    GAD 7 : Generalized Anxiety Score 03/22/2021 01/14/2021 11/25/2020 11/11/2020  Nervous, Anxious, on Edge 3 0 0 0  Control/stop worrying 0 0 0 0  Worry too much - different things 2 0 0 0  Trouble relaxing 2 0 0 0  Restless 0 0 0 0  Easily annoyed or irritable 2 0 0 0  Afraid - awful might happen 1 0 0 0  Total GAD 7 Score 10 0 0 0  Anxiety Difficulty Somewhat difficult Not difficult at all Not difficult at all -    BP Readings from Last 3 Encounters:  03/22/21 136/74  03/11/21 122/60  01/14/21 110/72    Physical Exam Constitutional:      Appearance: Normal appearance.  Cardiovascular:     Rate and Rhythm: Normal rate and regular rhythm.     Pulses: Normal pulses.     Heart sounds: No murmur heard. Pulmonary:     Effort: Pulmonary effort is normal.     Breath sounds:  Normal breath sounds. No wheezing or rhonchi.  Abdominal:     General: Abdomen is flat.     Palpations: Abdomen is soft.     Tenderness: There is no abdominal tenderness.  Musculoskeletal:     Cervical back: Normal range of motion.  Lymphadenopathy:     Cervical: No cervical adenopathy.  Neurological:     Mental Status: She is alert.  Psychiatric:        Mood and Affect: Mood normal.        Behavior: Behavior normal.    Wt Readings from Last 3 Encounters:  03/22/21 123 lb 9.6 oz (56.1 kg)  03/11/21 122 lb (55.3 kg)  01/14/21 122 lb (55.3 kg)    BP 136/74    Pulse 90    Ht _0  (1.6 m)    Wt 123 lb 9.6 oz (56.1 kg)    SpO2 95%    BMI 21.89 kg/m   Assessment and Plan: 1. Chronic nausea May be worsened by anxiety Continue phenergan as needed every 8 hours. - promethazine (PHENERGAN) 12.5 MG tablet; Take 1 tablet (12.5 mg total) by mouth every 8 (eight) hours as needed for nausea or vomiting.  Dispense: 90 tablet; Refill: 0  2. Generalized anxiety disorder GAD7 is elevated today.  No SI/HI. Will start sertraline 50 mg qhs Follow up in one month - sertraline (ZOLOFT) 50 MG tablet; Take 1 tablet (50 mg total) by mouth daily.  Dispense: 30 tablet; Refill: 1  3. Acquired thrombophilia ([place]) On Xarelto  4. Chronic obstructive pulmonary disease, unspecified COPD type ([place]) Stable without recent exacerbation On O2 [employer] nightly - discussed tubing arrangement and paper tape to allow more movement while sleeping.  5. Atherosclerosis of aorta ([place]) On statin therapy. - atorvastatin (LIPITOR) 20 MG tablet; Take 1 tablet (20 mg total) by mouth daily.  Dispense: 90 tablet; Refill: 1   Partially dictated using Editor, commissioning. Any errors are unintentional.  Halina Maidens, MD [facility]  03/22/2021

## 2021-03-23 ENCOUNTER — Ambulatory Visit: Payer: Medicare PPO | Admitting: Pulmonary Disease

## 2021-03-30 ENCOUNTER — Ambulatory Visit: Payer: Self-pay

## 2021-03-30 ENCOUNTER — Other Ambulatory Visit: Payer: Self-pay | Admitting: Internal Medicine

## 2021-03-30 DIAGNOSIS — F411 Generalized anxiety disorder: Secondary | ICD-10-CM

## 2021-03-30 MED ORDER — ESCITALOPRAM OXALATE 5 MG PO TABS: 5.0000 mg | ORAL_TABLET | Freq: Every day | ORAL | 0 refills | Status: DC

## 2021-03-30 NOTE — Telephone Encounter (Signed)
Patient informed. 

## 2021-03-30 NOTE — Telephone Encounter (Signed)
°  Chief Complaint: med change request Symptoms: zoloft causing nausea Frequency: several days Pertinent Negatives: NA Disposition: [] ED /[] Urgent Care (no appt availability in office) / [] Appointment(In office/virtual)/ []  [facility]/ [] Home Care/ [] Refused Recommended Disposition /[] [place]/ [x]  Follow-up with PCP Additional Notes: Pt had visit on 03/22/21 and was placed on Zoloft but she feels that it makes nausea worse and has been only taking 1/2 of a tablet. She states the phenergan helps slightly but is wanting to see if another medication for anxiety can help decrease nausea   Summary: Medication reaction; symptoms   Pt called to report that the sertraline she is taking makes her sick, she wakes up very nauseated when she takes it. Seeking alternative options.  Best contact: [phone]      Reason for Disposition  [1] Caller has NON-URGENT medicine question about med that PCP prescribed AND [2] triager unable to answer question  Protocols used: Medication Question Call-A-AH

## 2021-04-14 DIAGNOSIS — C44311 Basal cell carcinoma of skin of nose: Secondary | ICD-10-CM | POA: Diagnosis not present

## 2021-04-14 DIAGNOSIS — M950 Acquired deformity of nose: Secondary | ICD-10-CM | POA: Diagnosis not present

## 2021-04-14 DIAGNOSIS — L988 Other specified disorders of the skin and subcutaneous tissue: Secondary | ICD-10-CM | POA: Diagnosis not present

## 2021-04-14 DIAGNOSIS — L578 Other skin changes due to chronic exposure to nonionizing radiation: Secondary | ICD-10-CM | POA: Diagnosis not present

## 2021-04-21 ENCOUNTER — Ambulatory Visit: Payer: Medicare PPO | Admitting: Internal Medicine

## 2021-05-06 ENCOUNTER — Other Ambulatory Visit: Payer: Self-pay | Admitting: Internal Medicine

## 2021-05-06 DIAGNOSIS — F411 Generalized anxiety disorder: Secondary | ICD-10-CM

## 2021-05-06 NOTE — Telephone Encounter (Signed)
Requested Prescriptions  ?Pending Prescriptions Disp Refills  ?? escitalopram (LEXAPRO) 5 MG tablet [Pharmacy Med Name: ESCITALOPRAM 5MG  TABLETS] 30 tablet 0  ?  Sig: TAKE 1 TABLET(5 MG) BY MOUTH DAILY  ?  ? Psychiatry:  Antidepressants - SSRI Passed - 05/06/2021  2:26 PM  ?  ?  Passed - Valid encounter within last 6 months  ?  Recent Outpatient Visits   ?      ? 1 month ago Chronic nausea  ? [facility] Glean Hess, MD  ? 3 months ago Annual physical exam  ? [facility] Glean Hess, MD  ? 5 months ago Chronic vaginitis  ? [facility] Glean Hess, MD  ? 5 months ago Chronic vaginitis  ? [facility] Glean Hess, MD  ? 6 months ago Bacterial vaginosis  ? [facility] Glean Hess, MD  ?  ?  ? ?  ?  ?  ? ?

## 2021-05-09 ENCOUNTER — Other Ambulatory Visit: Payer: Self-pay | Admitting: Internal Medicine

## 2021-05-11 NOTE — Telephone Encounter (Signed)
D/C 06/11/20 ?Requested Prescriptions  ?Refused Prescriptions Disp Refills  ?? atorvastatin (LIPITOR) 10 MG tablet [Pharmacy Med Name: ATORVASTATIN '10MG'$  TABLETS] 90 tablet 3  ?  Sig: TAKE 1 TABLET(10 MG) BY MOUTH DAILY  ?  ? Cardiovascular:  Antilipid - Statins Failed - 05/09/2021  4:29 PM  ?  ?  Failed - Lipid Panel in normal range within the last 12 months  ?  Cholesterol, Total  ?Date Value Ref Range Status  ?01/14/2021 208 (H) 100 - 199 mg/dL Final  ? ?Cholesterol  ?Date Value Ref Range Status  ?10/08/2013 280 (H) 0 - 200 mg/dL Final  ? ?Ldl Cholesterol, Calc  ?Date Value Ref Range Status  ?10/08/2013 152 (H) 0 - 100 mg/dL Final  ? ?LDL Chol Calc (NIH)  ?Date Value Ref Range Status  ?01/14/2021 91 0 - 99 mg/dL Final  ? ?HDL Cholesterol  ?Date Value Ref Range Status  ?10/08/2013 106 (H) 40 - 60 mg/dL Final  ? ?HDL  ?Date Value Ref Range Status  ?01/14/2021 98 >39 mg/dL Final  ? ?Triglycerides  ?Date Value Ref Range Status  ?01/14/2021 113 0 - 149 mg/dL Final  ?10/08/2013 112 0 - 200 mg/dL Final  ? ?  ?  ?  Passed - Patient is not pregnant  ?  ?  Passed - Valid encounter within last 12 months  ?  Recent Outpatient Visits   ?      ? 1 month ago Chronic nausea  ? [facility] Glean Hess, MD  ? 3 months ago Annual physical exam  ? [facility] Glean Hess, MD  ? 5 months ago Chronic vaginitis  ? [facility] Glean Hess, MD  ? 6 months ago Chronic vaginitis  ? [facility] Glean Hess, MD  ? 6 months ago Bacterial vaginosis  ? [facility] Glean Hess, MD  ?  ?  ? ?  ?  ?  ? ?

## 2021-05-26 ENCOUNTER — Ambulatory Visit: Payer: Medicare PPO | Admitting: Internal Medicine

## 2021-05-26 ENCOUNTER — Other Ambulatory Visit: Payer: Self-pay

## 2021-05-26 ENCOUNTER — Encounter: Payer: Self-pay | Admitting: Internal Medicine

## 2021-05-26 VITALS — BP 122/78 | HR 67 | Ht 63.0 in | Wt 125.0 lb

## 2021-05-26 DIAGNOSIS — B029 Zoster without complications: Secondary | ICD-10-CM | POA: Diagnosis not present

## 2021-05-26 MED ORDER — VALACYCLOVIR HCL 1 G PO TABS: 1000.0000 mg | ORAL_TABLET | Freq: Three times a day (TID) | ORAL | 0 refills | Status: AC

## 2021-05-26 NOTE — Progress Notes (Signed)
? ? ?Date:  05/26/2021  ? ?Name:  [name]   DOB:  29-Feb-1934   MRN:  [id number] ? ? ?Chief Complaint: Flank Pain ? ?Flank Pain ?This is a new problem. Episode onset: 3-4 days. The problem occurs 2 to 4 times per day. The problem has been gradually worsening since onset. The quality of the pain is described as shooting. The pain does not radiate. The pain is at a severity of 6/10. The pain is mild. The pain is The same all the time. Pertinent negatives include no chest pain, dysuria or fever. She has tried nothing for the symptoms.  ? ?Lab Results  ?Component Value Date  ? NA 141 01/14/2021  ? K 4.0 01/14/2021  ? CO2 26 01/14/2021  ? GLUCOSE 91 01/14/2021  ? BUN 20 01/14/2021  ? CREATININE 0.84 01/14/2021  ? CALCIUM 9.8 01/14/2021  ? EGFR 68 01/14/2021  ? GFRNONAA 53 (L) 08/16/2020  ? ?Lab Results  ?Component Value Date  ? CHOL 208 (H) 01/14/2021  ? HDL 98 01/14/2021  ? [place] 91 01/14/2021  ? TRIG 113 01/14/2021  ? CHOLHDL 2.1 01/14/2021  ? ?Lab Results  ?Component Value Date  ? TSH 5.440 (H) 01/14/2021  ? ?No results found for: HGBA1C ?Lab Results  ?Component Value Date  ? WBC 9.7 01/14/2021  ? HGB 13.9 01/14/2021  ? HCT 43.0 01/14/2021  ? MCV 89 01/14/2021  ? PLT 235 01/14/2021  ? ?Lab Results  ?Component Value Date  ? ALT 15 01/14/2021  ? AST 24 01/14/2021  ? ALKPHOS 87 01/14/2021  ? BILITOT 0.5 01/14/2021  ? ?Lab Results  ?Component Value Date  ? VD25OH 32.34 06/20/2020  ?  ? ?Review of Systems  ?Constitutional:  Negative for chills, fatigue and fever.  ?Respiratory:  Negative for chest tightness and shortness of breath.   ?Cardiovascular:  Negative for chest pain.  ?Genitourinary:  Positive for flank pain. Negative for dysuria and urgency.  ?Skin:  Negative for color change and rash.  ?     Pain to left side and lower abdomen with even light touch or clothing  ?  ? ?Patient Active Problem List  ? Diagnosis Date Noted  ? Chronic nausea 12/09/2020  ? Closed intracapsular fracture of left femur with routine  healing 08/28/2020  ? Acquired thrombophilia ([place]) 07/15/2020  ? AF (paroxysmal atrial fibrillation) ([place]) 06/19/2020  ? Fracture of femoral neck, left, closed ([place]) 06/05/2020  ? Atherosclerosis of aorta ([place]) 01/09/2020  ? Stage 2 moderate COPD by GOLD classification ([place]) 01/09/2020  ? HSV (herpes simplex virus) infection 01/09/2020  ? BCC (basal cell carcinoma), face 10/12/2017  ? Environmental and seasonal allergies 10/30/2014  ? Psoriasis vulgaris 10/30/2014  ? Essential (primary) hypertension 10/30/2014  ? Gastro-esophageal reflux disease without esophagitis 10/30/2014  ? Hot flash, menopausal 10/30/2014  ? Hyperlipidemia, mixed 10/30/2014  ? History of cancer of left breast 10/30/2014  ? Bronchiectasis ([place]) 09/24/2013  ? ? ?Allergies  ?Allergen Reactions  ? Amoxicillin Nausea Only  ? Eliquis [Apixaban] Nausea Only  ? Xarelto [Rivaroxaban] Nausea Only  ? ? ?Past Surgical History:  ?Procedure Laterality Date  ? BREAST LUMPECTOMY Left 2008  ? CHOLECYSTECTOMY    ? HIP PINNING,CANNULATED Left 06/05/2020  ? Procedure: CANNULATED HIP PINNING;  Surgeon: Hessie Knows, MD;  Location: [facility] ORS;  Service: Orthopedics;  Laterality: Left;  ? PARTIAL HYSTERECTOMY    ? TEE WITHOUT CARDIOVERSION N/A 06/10/2020  ? Procedure: TRANSESOPHAGEAL ECHOCARDIOGRAM (TEE);  Surgeon: Kate Sable, MD;  Location: [facility] ORS;  Service: Cardiovascular;  Laterality: N/A;  ? ? ?Social History  ? ?Tobacco Use  ? Smoking status: Never  ? Smokeless tobacco: Never  ?Vaping Use  ? Vaping Use: Never used  ?Substance Use Topics  ? Alcohol use: No  ? Drug use: No  ? ? ? ?Medication list has been reviewed and updated. ? ?Current Meds  ?Medication Sig  ? atorvastatin (LIPITOR) 20 MG tablet Take 1 tablet (20 mg total) by mouth daily.  ? cetirizine (ZYRTEC) 10 MG tablet Take 1 tablet (10 mg total) by mouth daily.  ? famotidine (PEPCID) 10 MG tablet Take 10 mg by mouth daily.  ? fluticasone (FLONASE) 50 MCG/ACT nasal spray SHAKE LIQUID AND USE 2 SPRAYS  IN EACH NOSTRIL DAILY AS NEEDED FOR ALLERGIES OR RHINITIS  ? hydrochlorothiazide (MICROZIDE) 12.5 MG capsule Take 1 capsule (12.5 mg total) by mouth daily.  ? methotrexate (RHEUMATREX) 2.5 MG tablet Take 2.5 mg by mouth once a week. Caution:Chemotherapy. Protect from light.  ? metoprolol tartrate (LOPRESSOR) 25 MG tablet Take 0.5 tablets (12.5 mg total) by mouth 2 (two) times daily.  ? OXYGEN Inhale into the lungs. 2 lpm [place] nightly  ? promethazine (PHENERGAN) 12.5 MG tablet Take 1 tablet (12.5 mg total) by mouth every 8 (eight) hours as needed for nausea or vomiting.  ? Rivaroxaban (XARELTO) 15 MG TABS tablet Take 1 tablet (15 mg total) by mouth daily.  ? triamcinolone (KENALOG) 0.025 % cream Apply 1 application topically 2 (two) times daily. To external genitalia  ? ? ? ?  03/22/2021  ?  1:37 PM 02/17/2021  ?  8:11 AM 01/14/2021  ?  8:20 AM 11/25/2020  ?  2:09 PM  ?PHQ 2/9 Scores  ?PHQ - 2 Score 4 0 0 0  ?PHQ- 9 Score 10 2 0 6  ? ? ? ?  03/22/2021  ?  1:37 PM 01/14/2021  ?  8:20 AM 11/25/2020  ?  2:09 PM 11/11/2020  ? 10:13 AM  ?GAD 7 : Generalized Anxiety Score  ?Nervous, Anxious, on Edge 3 0 0 0  ?Control/stop worrying 0 0 0 0  ?Worry too much - different things 2 0 0 0  ?Trouble relaxing 2 0 0 0  ?Restless 0 0 0 0  ?Easily annoyed or irritable 2 0 0 0  ?Afraid - awful might happen 1 0 0 0  ?Total GAD 7 Score 10 0 0 0  ?Anxiety Difficulty Somewhat difficult Not difficult at all Not difficult at all   ? ? ?BP Readings from Last 3 Encounters:  ?05/26/21 122/78  ?03/22/21 136/74  ?03/11/21 122/60  ? ? ?Physical Exam ?Vitals and nursing note reviewed.  ?Constitutional:   ?   General: She is not in acute distress. ?   Appearance: Normal appearance. She is well-developed.  ?HENT:  ?   Head: Normocephalic and atraumatic.  ?Cardiovascular:  ?   Rate and Rhythm: Normal rate and regular rhythm.  ?Pulmonary:  ?   Effort: Pulmonary effort is normal. No respiratory distress.  ?   Breath sounds: No wheezing or rhonchi.  ?Skin: ?    General: Skin is warm and dry.  ?   Findings: No rash.  ? ?    ?   Comments: No rash or other lesions noted ?Very sensitive to touch in the area noted  ?Neurological:  ?   Mental Status: She is alert and oriented to person, place, and time.  ?Psychiatric:     ?  Mood and Affect: Mood normal.     ?   Behavior: Behavior normal.  ? ? ?Wt Readings from Last 3 Encounters:  ?05/26/21 125 lb (56.7 kg)  ?03/22/21 123 lb 9.6 oz (56.1 kg)  ?03/11/21 122 lb (55.3 kg)  ? ? ?BP 122/78   Pulse 67   Ht _0  (1.6 m)   Wt 125 lb (56.7 kg)   SpO2 99%   BMI 22.14 kg/m?  ? ?Assessment and Plan: ?1. Herpes zoster without complication ?Suspect this is zoster without any rash ?Tylenol or Advil as needed for pain ?Will treat presumptively ?- valACYclovir (VALTREX) 1000 MG tablet; Take 1 tablet (1,000 mg total) by mouth 3 (three) times daily for 10 days.  Dispense: 30 tablet; Refill: 0 ? ? ?Partially dictated using Editor, commissioning. Any errors are unintentional. ? ?Halina Maidens, MD ?[facility] ?[facility] ? ?05/26/2021 ? ? ? ? ?

## 2021-05-27 ENCOUNTER — Encounter: Payer: Self-pay | Admitting: Pulmonary Disease

## 2021-05-27 ENCOUNTER — Ambulatory Visit: Payer: Medicare PPO | Admitting: Pulmonary Disease

## 2021-05-27 VITALS — BP 112/78 | HR 82 | Temp 97.8°F | Ht 63.0 in | Wt 124.0 lb

## 2021-05-27 DIAGNOSIS — I5189 Other ill-defined heart diseases: Secondary | ICD-10-CM | POA: Diagnosis not present

## 2021-05-27 DIAGNOSIS — J479 Bronchiectasis, uncomplicated: Secondary | ICD-10-CM

## 2021-05-27 DIAGNOSIS — G4736 Sleep related hypoventilation in conditions classified elsewhere: Secondary | ICD-10-CM | POA: Diagnosis not present

## 2021-05-27 DIAGNOSIS — I2729 Other secondary pulmonary hypertension: Secondary | ICD-10-CM | POA: Diagnosis not present

## 2021-05-27 DIAGNOSIS — G4734 Idiopathic sleep related nonobstructive alveolar hypoventilation: Secondary | ICD-10-CM

## 2021-05-27 DIAGNOSIS — I272 Pulmonary hypertension, unspecified: Secondary | ICD-10-CM

## 2021-05-27 NOTE — Progress Notes (Signed)
Subjective:    Patient ID: [name], female    DOB: March 02, 1934, 88 y.o.   MRN: [id number] Patient Care Team: Olivia Hess, MD as PCP - General (Internal Medicine) End, Olivia Gave, MD as PCP - Cardiology (Cardiology) Dasher, Rayvon Char, MD (Dermatology) Olivia Pian, MD as Referring Physician (Specialist)  Chief Complaint  Patient presents with   Follow-up    HPI [name] is an 88 year old lifelong never smoker who presents for follow-up of pulmonary hypertension.  The patient presents today with no significant complaint.  She was noted to have moderate pulmonary hypertension by echocardiogram obtained in April.  This echocardiogram also showed that patient has grade II diastolic dysfunction and mild mitral stenosis. The patient has also been noted to have paroxysmal atrial fibrillation not on anticoagulation due to intolerance of anticoagulants.  The patient states that she was actually doing well until April 1 when she sustained a left hip fracture after a mechanical fall.  She underwent pinning of the left hip and then subsequently was sent to an SNF for rehab.  On the 20th of April she was readmitted with encephalopathy, atrial fibrillation with RVR and metabolic derangements.  These issues were corrected and her encephalopathy cleared.  Chest CT at that time showed chronic inflammatory changes and no PE.  2D echo as noted above showed that she had grade II diastolic dysfunction and mild mitral stenosis.  Since her anticoagulants have been discontinued due to issues with nausea associated with the same.  This happened with both Eliquis and she follows with cardiology with regards to her paroxysmal atrial fibrillation and diastolic dysfunction.  Echocardiogram is consistent with moderate pulmonary hypertension however she is totally asymptomatic in this regard.  She has no sleep disturbance.  She does have frequent awakenings due to nocturia.  She states that this is not a new issue  for her.  She has not had any chest pain, no orthopnea.  Has had few episodes of paroxysmal nocturnal dyspnea but none recent.  She and her spouse sleep in separate rooms due to his snoring.  However she does not recall anyone ever telling her she snores and she does not have daytime somnolence.  She feels that she is rested in the morning when she arises.  She has frequent issues with heartburn and indigestion, she does have a hiatal hernia.  She does not endorse any other symptomatology. She does not endorse any fevers, chills or sweats.  No weight loss or anorexia.  No cough or sputum production.  Had overnight oximetry in August 2022 that showed nocturnal oxygen desaturations she has been on supplemental oxygen at 2 L/min nocturnally and is compliant with the therapy.  Notes that she is refreshed in the mornings when she wakes up.   DATA: 02/20/2020 PFTs [name]): FEV1 was 1.13 L or 73% predicted, FVC was 1.96 L or 90% predicted, FEV1/FVC was 58%, lung volumes were normal, diffusion capacity corrected for alveolar volume was 84%.  Flow volume loop was consistent with obstruction.  Consistent with obstructive defect.  Ambulatory oximetry performed same day showed no significant oxygen desaturations, oxygen saturations at rest on room air was 97% on exertion nadir was 95%. 06/10/2020 2D echo: LVEF 65 to 70% no wall motion abnormalities.  Grade II DD.  Right ventricular systolic function normal right ventricular size normal.  Moderately elevated pulmonary artery systolic pressure.  Mild mitral stenosis. 08/16/2020 CT angio chest: No PE.  Bilateral streaky densities with interstitial nodularity suggestive of  atypical infection. Emphysematous changes described likely senescent changes.  Bronchiectasis, small hiatal hernia. 10/08/2020 overnight oximetry: Patient qualified for oxygen nocturnally with O2 sat nadir at 82%.  Review of Systems     Objective:   Physical Exam BP 112/78 (BP Location: Left  Arm, Patient Position: Sitting, Cuff Size: Normal)   Pulse 82   Temp 97.8 F (36.6 C) (Oral)   Ht '5\' 3"'$  (1.6 m)   Wt 124 lb (56.2 kg)   SpO2 95%   BMI 21.97 kg/m   GENERAL: Frail-appearing elderly woman, ambulates with assistance of a cane.  No conversational dyspnea.  Awake, alert.  Fully oriented. HEAD: Normocephalic, atraumatic.  EYES: Pupils equal, round, reactive to light.  No scleral icterus.  MOUTH: Nose/mouth/throat not examined due to masking requirements for COVID 19. NECK: Supple. No thyromegaly. Trachea midline. No JVD.  No adenopathy. PULMONARY: Good air entry bilaterally.  No adventitious sounds. CARDIOVASCULAR: S1 and S2. Regular rate and rhythm.  Normal ABDOMEN: Benign. MUSCULOSKELETAL: No joint deformity, no clubbing, no edema.  NEUROLOGIC: No overt focal deficit, awake, alert fully oriented. SKIN: Intact,warm,dry. PSYCH: Mood and behavior normal.        Assessment & Plan:     ICD-10-CM   1. Bronchiectasis without complication ([facility])  [id number] Pulmonary Function Test [facility] Only    Alpha-1-antitrypsin   Will obtain PFTs Not severe by CT chest    2. Pulmonary hypertension (HCC)  I27.20 ECHOCARDIOGRAM COMPLETE   Moderate by 2D echo Recheck echocardiogram Continue nocturnal oxygen    3. Nocturnal hypoxemia due to pulmonary hypertension ([facility]CC)  I27.29    G47.36    Continue oxygen at 2 L/min nocturnally    4. Grade II diastolic dysfunction  [id number]    This issue adds complexity to her management May add to the etiology of her pulmonary hypertension     Orders Placed This Encounter  Procedures   Alpha-1-antitrypsin    Standing Status:   Future    Standing Expiration Date:   05/28/2022   Pulmonary Function Test [facility] Only    Standing Status:   Future    Standing Expiration Date:   05/28/2022    Order Specific Question:   Full PFT: includes the following: basic spirometry, spirometry pre & post bronchodilator, diffusion capacity (DLCO), lung volumes    Answer:    Full PFT    Order Specific Question:   This test can only be performed at    Answer:   [place]   ECHOCARDIOGRAM COMPLETE    Standing Status:   Future    Number of Occurrences:   1    Standing Expiration Date:   11/27/2021    Order Specific Question:   Where should this test be performed    Answer:   [facility]    Order Specific Question:   Perflutren DEFINITY (image enhancing agent) should be administered unless hypersensitivity or allergy exist    Answer:   Administer Perflutren    Order Specific Question:   Reason for exam-Echo    Answer:   Pulmonary hypertension I27.2   Will see the patient in follow-up in 3 months time she is to contact us prior to that time should any new difficulties arise.  Renold Don, MD Advanced Bronchoscopy PCCM [facility]    *This note was dictated using voice recognition software/Dragon.  Despite best efforts to proofread, errors can occur which can change the meaning. Any transcriptional errors that result from this process are unintentional and may not  be fully corrected at the time of dictation.

## 2021-05-27 NOTE — Patient Instructions (Addendum)
We are going to go ahead and get a breathing test and a heart test to follow-up on your issues with the pulmonary hypertension.  We are also going to get a blood test that can be done at the time you come for the other 2 tests. ? ?Continue using your oxygen at nighttime. ? ?We will see him in follow-up in 3 months time call sooner should any new problems arise. ?

## 2021-06-11 ENCOUNTER — Ambulatory Visit (INDEPENDENT_AMBULATORY_CARE_PROVIDER_SITE_OTHER): Payer: Medicare PPO

## 2021-06-11 DIAGNOSIS — I272 Pulmonary hypertension, unspecified: Secondary | ICD-10-CM

## 2021-06-11 LAB — ECHOCARDIOGRAM COMPLETE
AR max vel: 1.86 cm2
AV Area VTI: 2.09 cm2
AV Area mean vel: 1.78 cm2
AV Mean grad: 4 mmHg
AV Peak grad: 7.5 mmHg
Ao pk vel: 1.37 m/s
Area-P 1/2: 1.81 cm2
Calc EF: 66.9 %
MV VTI: 1.29 cm2
S' Lateral: 1.7 cm
Single Plane A2C EF: 62.8 %
Single Plane A4C EF: 69.6 %

## 2021-06-16 NOTE — Progress Notes (Signed)
Called and spoke with patient, advised of results/recommendations per Dr. Patsey Berthold.  She verbalized understanding.  Nothing further needed.

## 2021-06-21 DIAGNOSIS — H401131 Primary open-angle glaucoma, bilateral, mild stage: Secondary | ICD-10-CM | POA: Diagnosis not present

## 2021-06-21 DIAGNOSIS — H2513 Age-related nuclear cataract, bilateral: Secondary | ICD-10-CM | POA: Diagnosis not present

## 2021-07-08 ENCOUNTER — Ambulatory Visit: Payer: Self-pay | Admitting: *Deleted

## 2021-07-08 ENCOUNTER — Ambulatory Visit
Admission: EM | Admit: 2021-07-08 | Discharge: 2021-07-08 | Disposition: A | Discharge status: Home / Self Care | Payer: Medicare PPO | Attending: Physician Assistant | Admitting: Physician Assistant

## 2021-07-08 ENCOUNTER — Other Ambulatory Visit: Payer: Self-pay

## 2021-07-08 DIAGNOSIS — R300 Dysuria: Secondary | ICD-10-CM | POA: Diagnosis not present

## 2021-07-08 DIAGNOSIS — N3001 Acute cystitis with hematuria: Secondary | ICD-10-CM | POA: Insufficient documentation

## 2021-07-08 DIAGNOSIS — B3731 Acute candidiasis of vulva and vagina: Secondary | ICD-10-CM | POA: Diagnosis not present

## 2021-07-08 LAB — URINALYSIS, ROUTINE W REFLEX MICROSCOPIC
Bilirubin Urine: NEGATIVE
Glucose, UA: NEGATIVE mg/dL
Ketones, ur: NEGATIVE mg/dL
Nitrite: POSITIVE — AB
Protein, ur: NEGATIVE mg/dL
Specific Gravity, Urine: 1.025 (ref 1.005–1.030)
pH: 5.5 (ref 5.0–8.0)

## 2021-07-08 LAB — URINALYSIS, MICROSCOPIC (REFLEX)
RBC / HPF: >50 RBC/hpf (ref 0–5)
WBC, UA: >50 WBC/hpf (ref 0–5)

## 2021-07-08 MED ORDER — FLUCONAZOLE 150 MG PO TABS: 150.0000 mg | ORAL_TABLET | Freq: Once | ORAL | 0 refills | Status: AC

## 2021-07-08 MED ORDER — CEPHALEXIN 500 MG PO CAPS: 500.0000 mg | ORAL_CAPSULE | Freq: Two times a day (BID) | ORAL | 0 refills | Status: AC

## 2021-07-08 NOTE — ED Triage Notes (Signed)
Pt c/o possible UTI. Pt is having Painful urination x3days ?

## 2021-07-08 NOTE — Discharge Instructions (Signed)

## 2021-07-08 NOTE — ED Provider Notes (Signed)
?[facility] ? ? ? ?CSN: [id number] ?Arrival date & time: 07/08/21  1438 ? ? ?  ? ?History   ?Chief Complaint ?Chief Complaint  ?Patient presents with  ? Urinary Tract Infection  ? ? ?HPI ?[name] is a 88 y.o. female with history of 2 to 3 days of dysuria, urinary frequency and urgency.  She is denying any fever, fatigue, flank pain or abdominal pain.  Has not noticed any blood in the urine.  Denies any vaginal discharge, itching or odor.  She believes she may have a UTI.  Past medical history significant for hypertension, hyperlipidemia, GERD, COPD, bronchiectasis and chronic nausea.  No other complaints today. ? ?HPI ? ?Past Medical History:  ?Diagnosis Date  ? Acute metabolic encephalopathy 93/23/5573  ? Allergy   ? Bronchiectasis ([place])   ? Cancer [facility])   ? left breast cancer   ? COPD (chronic obstructive pulmonary disease) ([place])   ? GERD (gastroesophageal reflux disease)   ? Hyperlipemia   ? Hypertension   ? Intractable nausea and vomiting 06/20/2018  ? ? ?Patient Active Problem List  ? Diagnosis Date Noted  ? Chronic nausea 12/09/2020  ? Closed intracapsular fracture of left femur with routine healing 08/28/2020  ? Acquired thrombophilia ([place]) 07/15/2020  ? AF (paroxysmal atrial fibrillation) ([place]) 06/19/2020  ? Fracture of femoral neck, left, closed ([place]) 06/05/2020  ? Atherosclerosis of aorta ([place]) 01/09/2020  ? Stage 2 moderate COPD by GOLD classification ([place]) 01/09/2020  ? HSV (herpes simplex virus) infection 01/09/2020  ? BCC (basal cell carcinoma), face 10/12/2017  ? Environmental and seasonal allergies 10/30/2014  ? Psoriasis vulgaris 10/30/2014  ? Essential (primary) hypertension 10/30/2014  ? Gastro-esophageal reflux disease without esophagitis 10/30/2014  ? Hot flash, menopausal 10/30/2014  ? Hyperlipidemia, mixed 10/30/2014  ? History of cancer of left breast 10/30/2014  ? Bronchiectasis ([place]) 09/24/2013  ? ? ?Past Surgical History:  ?Procedure Laterality Date  ? BREAST LUMPECTOMY  Left 2008  ? CHOLECYSTECTOMY    ? HIP PINNING,CANNULATED Left 06/05/2020  ? Procedure: CANNULATED HIP PINNING;  Surgeon: Hessie Knows, MD;  Location: [facility] ORS;  Service: Orthopedics;  Laterality: Left;  ? PARTIAL HYSTERECTOMY    ? TEE WITHOUT CARDIOVERSION N/A 06/10/2020  ? Procedure: TRANSESOPHAGEAL ECHOCARDIOGRAM (TEE);  Surgeon: Kate Sable, MD;  Location: [facility] ORS;  Service: Cardiovascular;  Laterality: N/A;  ? ? ?OB History   ?No obstetric history on file. ?  ? ? ? ?Home Medications   ? ?Prior to Admission medications   ?Medication Sig Start Date End Date Taking? Authorizing Provider  ?atorvastatin (LIPITOR) 20 MG tablet Take 1 tablet (20 mg total) by mouth daily. 03/22/21  Yes Glean Hess, MD  ?cephALEXin (KEFLEX) 500 MG capsule Take 1 capsule (500 mg total) by mouth 2 (two) times daily for 7 days. 07/08/21 07/15/21 Yes Danton Clap, PA-C  ?cetirizine (ZYRTEC) 10 MG tablet Take 1 tablet (10 mg total) by mouth daily. 06/11/20  Yes Sreenath, Sudheer B, MD  ?famotidine (PEPCID) 10 MG tablet Take 10 mg by mouth daily.   Yes [provider]  ?fluconazole (DIFLUCAN) 150 MG tablet Take 1 tablet (150 mg total) by mouth once for 1 dose. 07/08/21 07/08/21 Yes Laurene Footman B, PA-C  ?fluticasone (FLONASE) 50 MCG/ACT nasal spray SHAKE LIQUID AND USE 2 SPRAYS IN EACH NOSTRIL DAILY AS NEEDED FOR ALLERGIES OR RHINITIS 01/14/21  Yes Glean Hess, MD  ?hydrochlorothiazide (MICROZIDE) 12.5 MG capsule Take 1 capsule (12.5 mg total) by mouth  daily. 03/11/21  Yes Monge, Helane Gunther, NP  ?methotrexate (RHEUMATREX) 2.5 MG tablet Take 2.5 mg by mouth once a week. Caution:Chemotherapy. Protect from light.   Yes [provider]  ?metoprolol tartrate (LOPRESSOR) 25 MG tablet Take 0.5 tablets (12.5 mg total) by mouth 2 (two) times daily. 07/16/20 07/11/21 Yes Furth, Cadence H, PA-C  ?OXYGEN Inhale into the lungs. 2 lpm [place] nightly   Yes [provider]  ?promethazine (PHENERGAN) 12.5 MG tablet Take 1 tablet (12.5  mg total) by mouth every 8 (eight) hours as needed for nausea or vomiting. 03/22/21  Yes Glean Hess, MD  ?Rivaroxaban (XARELTO) 15 MG TABS tablet Take 1 tablet (15 mg total) by mouth daily. 03/11/21 09/07/21 Yes Monge, Helane Gunther, NP  ?triamcinolone (KENALOG) 0.025 % cream Apply 1 application topically 2 (two) times daily. To external genitalia 11/25/20  Yes Glean Hess, MD  ? ? ?Family History ?Family History  ?Problem Relation Age of Onset  ? Breast cancer Sister   ? ? ?Social History ?Social History  ? ?Tobacco Use  ? Smoking status: Never  ? Smokeless tobacco: Never  ?Vaping Use  ? Vaping Use: Never used  ?Substance Use Topics  ? Alcohol use: No  ? Drug use: No  ? ? ? ?Allergies   ?Amoxicillin, Eliquis [apixaban], and Xarelto [rivaroxaban] ? ? ?Review of Systems ?Review of Systems  ?Constitutional:  Negative for chills, fatigue and fever.  ?Gastrointestinal:  Negative for abdominal pain, diarrhea, nausea and vomiting.  ?Genitourinary:  Positive for dysuria, frequency and urgency. Negative for decreased urine volume, flank pain, hematuria, pelvic pain, vaginal bleeding, vaginal discharge and vaginal pain.  ?Musculoskeletal:  Negative for back pain.  ?Skin:  Negative for rash.  ? ? ?Physical Exam ?Triage Vital Signs ?ED Triage Vitals  ?Enc Vitals Group  ?   BP 07/08/21 1453 (!) 153/75  ?   Pulse Rate 07/08/21 1453 87  ?   Resp 07/08/21 1453 18  ?   Temp 07/08/21 1453 97.6 ?F (36.4 ?C)  ?   Temp Source 07/08/21 1453 Oral  ?   SpO2 07/08/21 1453 94 %  ?   Weight 07/08/21 1450 120 lb (54.4 kg)  ?   Height 07/08/21 1450 '5\' 3"'$  (1.6 m)  ?   Head Circumference --   ?   Peak Flow --   ?   Pain Score 07/08/21 1450 8  ?   Pain Loc --   ?   Pain Edu? --   ?   Excl. in [place]? --   ? ?No data found. ? ?Updated Vital Signs ?BP (!) 153/75 (BP Location: Left Arm)   Pulse 87   Temp 97.6 ?F (36.4 ?C) (Oral)   Resp 18   Ht '5\' 3"'$  (1.6 m)   Wt 120 lb (54.4 kg)   SpO2 94%   BMI 21.26 kg/m?  ?   ? ?Physical Exam ?Vitals and  nursing note reviewed.  ?Constitutional:   ?   General: She is not in acute distress. ?   Appearance: Normal appearance. She is not ill-appearing or toxic-appearing.  ?HENT:  ?   Head: Normocephalic and atraumatic.  ?Eyes:  ?   General: No scleral icterus.    ?   Right eye: No discharge.     ?   Left eye: No discharge.  ?   Conjunctiva/sclera: Conjunctivae normal.  ?Cardiovascular:  ?   Rate and Rhythm: Normal rate and regular rhythm.  ?   Heart sounds:  Normal heart sounds.  ?Pulmonary:  ?   Effort: Pulmonary effort is normal. No respiratory distress.  ?   Breath sounds: Normal breath sounds.  ?Abdominal:  ?   Palpations: Abdomen is soft.  ?   Tenderness: There is no abdominal tenderness. There is no right CVA tenderness or left CVA tenderness.  ?Musculoskeletal:  ?   Cervical back: Neck supple.  ?Skin: ?   General: Skin is dry.  ?Neurological:  ?   General: No focal deficit present.  ?   Mental Status: She is alert. Mental status is at baseline.  ?   Motor: No weakness.  ?   Gait: Gait normal.  ?Psychiatric:     ?   Mood and Affect: Mood normal.     ?   Behavior: Behavior normal.     ?   Thought Content: Thought content normal.  ? ? ? ?UC Treatments / Results  ?Labs ?(all labs ordered are listed, but only abnormal results are displayed) ?Labs Reviewed  ?URINALYSIS, ROUTINE W REFLEX MICROSCOPIC - Abnormal; Notable for the following components:  ?    Result Value  ? APPearance CLOUDY (*)   ? Hgb urine dipstick TRACE (*)   ? Nitrite POSITIVE (*)   ? Leukocytes,Ua MODERATE (*)   ? All other components within normal limits  ?URINALYSIS, MICROSCOPIC (REFLEX) - Abnormal; Notable for the following components:  ? Bacteria, UA MANY (*)   ? All other components within normal limits  ?URINE CULTURE  ? ? ?EKG ? ? ?Radiology ?No results found. ? ?Procedures ?Procedures (including critical care time) ? ?Medications Ordered in UC ?Medications - No data to display ? ?Initial Impression / Assessment and Plan / UC Course  ?I have  reviewed the triage vital signs and the nursing notes. ? ?Pertinent labs & imaging results that were available during my care of the patient were reviewed by me and considered in my medical decision making (see chart

## 2021-07-08 NOTE — Telephone Encounter (Signed)
Summary: possible UTI pain when urinating  ? Pt stated possible UTI pain when urinating. Pt stated for about two days now.  ? ?Pt denied abdominal pain.   ? ?Seeking clinical advice.  ? ?No appointments available.   ?  ? ?Reason for Disposition ? [1] SEVERE pain with urination (e.g., excruciating) AND [2] not improved after 2 hours of pain medicine and Sitz bath ? ?Answer Assessment - Initial Assessment Questions ?1. SEVERITY: "How bad is the pain?"  (e.g., Scale 1-10; mild, moderate, or severe) ?  - MILD (1-3): complains slightly about urination hurting ?  - MODERATE (4-7): interferes with normal activities   ?  - SEVERE (8-10): excruciating, unwilling or unable to urinate because of the pain  ?    8-severe ?2. FREQUENCY: "How many times have you had painful urination today?"  ?    Every time ?3. PATTERN: "Is pain present every time you urinate or just sometimes?"  ?    yes ?4. ONSET: "When did the painful urination start?"  ?    2 days ?5. FEVER: "Do you have a fever?" If Yes, ask: "What is your temperature, how was it measured, and when did it start?" ?    no ?6. PAST UTI: "Have you had a urine infection before?" If Yes, ask: "When was the last time?" and "What happened that time?"  ?    Yes- few months- antibiotic- yeast medication ?7. CAUSE: "What do you think is causing the painful urination?"  (e.g., UTI, scratch, Herpes sore) ?    UTI ?8. OTHER SYMPTOMS: "Do you have any other symptoms?" (e.g., flank pain, vaginal discharge, genital sores, urgency, blood in urine) ?    no ?9. PREGNANCY: "Is there any chance you are pregnant?" "When was your last menstrual period?" ? ?Protocols used: Urination Pain - Female-A-AH ? ?

## 2021-07-08 NOTE — Telephone Encounter (Signed)
?  Chief Complaint: pain with urination ?Symptoms: pain with urination ?Frequency: 2 days- every time urinates ?Pertinent Negatives: Patient denies fever ?Disposition: '[]'$ ED /'[x]'$ Urgent Care (no appt availability in office) / '[]'$ Appointment(In office/virtual)/ '[]'$  [facility]/ '[]'$ Home Care/ '[]'$ Refused Recommended Disposition /'[]'$ [facility]/ '[]'$  Follow-up with PCP ?Additional Notes: Patient offered virtual platform vs UC visit- she prefers in person visit  ?

## 2021-07-08 NOTE — Telephone Encounter (Signed)
Spoke to pt she stated that she was already on the way to UC. ? ?KP ?

## 2021-07-11 LAB — URINE CULTURE: Culture: >=100000 — AB

## 2021-07-15 ENCOUNTER — Telehealth: Payer: Self-pay | Admitting: Medical

## 2021-07-15 NOTE — Telephone Encounter (Signed)
STAT if HR is under 50 or over 120 ?(normal HR is 60-100 beats per minute) ? ?What is your heart rate? 62 ? ?Do you have a log of your heart rate readings (document readings)? 90, 78 ? ?Do you have any other symptoms? Tired ? ? ?Patient said her HR jumped up to 90 while she was eating breakfast. It has since come back down. She was not sure what to do.  ?

## 2021-07-15 NOTE — Telephone Encounter (Signed)
Called and spoke with pt. See message below.  ?Pt's HR now at time of call is 78.  ?Pt does continue metoprolol tartrate 12.5 mg twice daily.  ?In review of pt's medications pt took last dose of cephalexin this morning.  ?Was taking abx for UTI. Pt states UTI s/s have resolved.  ?Advised pt that cephalexin may cause incr tiredness.  ? ?Pt does have h/o a fib and is on Xarelto with no missed doses.  ?Pt denies any SOB, chest pain, light headedness, etc.  ?Pt feels stable at this time and HR WNL at this time.  ? ?Advised pt to continue to monitor HR and s/s.  ?Pt will let us know of any recurrent s/s. ?ER precautions provided.  ?Pt voiced understanding and has no further questions/concerns at this time.  ?

## 2021-07-24 ENCOUNTER — Ambulatory Visit
Admission: EM | Admit: 2021-07-24 | Discharge: 2021-07-24 | Disposition: A | Discharge status: Home / Self Care | Payer: Medicare PPO | Attending: Student | Admitting: Student

## 2021-07-24 ENCOUNTER — Other Ambulatory Visit: Payer: Self-pay

## 2021-07-24 DIAGNOSIS — N3001 Acute cystitis with hematuria: Secondary | ICD-10-CM | POA: Diagnosis not present

## 2021-07-24 LAB — URINALYSIS, ROUTINE W REFLEX MICROSCOPIC
Bilirubin Urine: NEGATIVE
Glucose, UA: NEGATIVE mg/dL
Nitrite: POSITIVE — AB
Protein, ur: 30 mg/dL — AB
Specific Gravity, Urine: 1.02 (ref 1.005–1.030)
pH: 7 (ref 5.0–8.0)

## 2021-07-24 LAB — URINALYSIS, MICROSCOPIC (REFLEX): WBC, UA: >50 WBC/hpf (ref 0–5)

## 2021-07-24 MED ORDER — FLUCONAZOLE 150 MG PO TABS: 150.0000 mg | ORAL_TABLET | Freq: Every day | ORAL | 0 refills | Status: DC

## 2021-07-24 MED ORDER — CIPROFLOXACIN HCL 500 MG PO TABS: 500.0000 mg | ORAL_TABLET | Freq: Two times a day (BID) | ORAL | 0 refills | Status: AC

## 2021-07-24 NOTE — ED Provider Notes (Signed)
[facility]    CSN: [id number] Arrival date & time: 07/24/21  1031      History   Chief Complaint Chief Complaint  Patient presents with   Dysuria    HPI [name] is a 88 y.o. female presenting with concern for UTI.  We last saw her for a UTI on 5/4 for UTI, which was managed with Keflex with resolution, until symptoms returned 2 days ago.  Prior to that, her last UTI was 01/2021.  Today she describes 2 days of dysuria and frequency.  There is no associated abdominal pain, flank pain, fever/chills, gross hematuria, vaginal discharge, vaginal itching, vaginal rash or lesion.  States she does require Diflucan to prevent vaginal candidiasis.  Has not taken any medications at home for this including Azo.  HPI  Past Medical History:  Diagnosis Date   Acute metabolic encephalopathy 57/32/2025   Allergy    Bronchiectasis ([place])    Cancer (HCC)    left breast cancer    COPD (chronic obstructive pulmonary disease) (HCC)    GERD (gastroesophageal reflux disease)    Hyperlipemia    Hypertension    Intractable nausea and vomiting 06/20/2018    Patient Active Problem List   Diagnosis Date Noted   Chronic nausea 12/09/2020   Closed intracapsular fracture of left femur with routine healing 08/28/2020   Acquired thrombophilia ([place]) 07/15/2020   AF (paroxysmal atrial fibrillation) ([place]) 06/19/2020   Fracture of femoral neck, left, closed ([place]) 06/05/2020   Atherosclerosis of aorta ([place]) 01/09/2020   Stage 2 moderate COPD by GOLD classification ([place]) 01/09/2020   HSV (herpes simplex virus) infection 01/09/2020   BCC (basal cell carcinoma), face 10/12/2017   Environmental and seasonal allergies 10/30/2014   Psoriasis vulgaris 10/30/2014   Essential (primary) hypertension 10/30/2014   Gastro-esophageal reflux disease without esophagitis 10/30/2014   Hot flash, menopausal 10/30/2014   Hyperlipidemia, mixed 10/30/2014   History of cancer of left breast 10/30/2014    Bronchiectasis ([place]) 09/24/2013    Past Surgical History:  Procedure Laterality Date   BREAST LUMPECTOMY Left 2008   CHOLECYSTECTOMY     HIP PINNING,CANNULATED Left 06/05/2020   Procedure: CANNULATED HIP PINNING;  Surgeon: Hessie Knows, MD;  Location: [facility] ORS;  Service: Orthopedics;  Laterality: Left;   PARTIAL HYSTERECTOMY     TEE WITHOUT CARDIOVERSION N/A 06/10/2020   Procedure: TRANSESOPHAGEAL ECHOCARDIOGRAM (TEE);  Surgeon: Kate Sable, MD;  Location: [facility] ORS;  Service: Cardiovascular;  Laterality: N/A;    OB History   No obstetric history on file.      Home Medications    Prior to Admission medications   Medication Sig Start Date End Date Taking? Authorizing Provider  atorvastatin (LIPITOR) 20 MG tablet Take 1 tablet (20 mg total) by mouth daily. 03/22/21  Yes Glean Hess, MD  cetirizine (ZYRTEC) 10 MG tablet Take 1 tablet (10 mg total) by mouth daily. 06/11/20  Yes Sreenath, Sudheer B, MD  ciprofloxacin (CIPRO) 500 MG tablet Take 1 tablet (500 mg total) by mouth 2 (two) times daily for 7 days. 07/24/21 07/31/21 Yes Hazel Sams, PA-C  famotidine (PEPCID) 10 MG tablet Take 10 mg by mouth daily.   Yes [provider]  fluconazole (DIFLUCAN) 150 MG tablet Take 1 tablet (150 mg total) by mouth daily. -For your yeast infection, start the Diflucan (fluconazole)- Take one pill today (day 1). If you're still having symptoms in 3 days, take the second pill. 07/24/21  Yes Hazel Sams, PA-C  fluticasone (FLONASE) 50 MCG/ACT nasal spray SHAKE LIQUID AND USE 2 SPRAYS IN EACH NOSTRIL DAILY AS NEEDED FOR ALLERGIES OR RHINITIS 01/14/21  Yes Glean Hess, MD  folic acid (FOLVITE) 1 MG tablet Take 1 mg by mouth daily. 07/13/21  Yes [provider]  hydrochlorothiazide (MICROZIDE) 12.5 MG capsule Take 1 capsule (12.5 mg total) by mouth daily. 03/11/21  Yes Monge, Helane Gunther, NP  methotrexate (RHEUMATREX) 2.5 MG tablet Take 2.5 mg by mouth once a week.  Caution:Chemotherapy. Protect from light.   Yes [provider]  metoprolol tartrate (LOPRESSOR) 25 MG tablet Take 0.5 tablets (12.5 mg total) by mouth 2 (two) times daily. 07/16/20 07/24/21 Yes Furth, Cadence H, PA-C  OXYGEN Inhale into the lungs. 2 lpm [place] nightly   Yes [provider]  promethazine (PHENERGAN) 12.5 MG tablet Take 1 tablet (12.5 mg total) by mouth every 8 (eight) hours as needed for nausea or vomiting. 03/22/21  Yes Glean Hess, MD  Rivaroxaban (XARELTO) 15 MG TABS tablet Take 1 tablet (15 mg total) by mouth daily. 03/11/21 09/07/21 Yes Monge, Helane Gunther, NP  triamcinolone (KENALOG) 0.025 % cream Apply 1 application topically 2 (two) times daily. To external genitalia 11/25/20  Yes Glean Hess, MD    Family History Family History  Problem Relation Age of Onset   Breast cancer Sister     Social History Social History   Tobacco Use   Smoking status: Never   Smokeless tobacco: Never  Vaping Use   Vaping Use: Never used  Substance Use Topics   Alcohol use: No   Drug use: No     Allergies   Amoxicillin and Eliquis [apixaban]   Review of Systems Review of Systems  Constitutional:  Negative for appetite change, chills, diaphoresis and fever.  Respiratory:  Negative for shortness of breath.   Cardiovascular:  Negative for chest pain.  Gastrointestinal:  Negative for abdominal pain, blood in stool, constipation, diarrhea, nausea and vomiting.  Genitourinary:  Positive for dysuria and frequency. Negative for decreased urine volume, difficulty urinating, flank pain, genital sores, hematuria and urgency.  Musculoskeletal:  Negative for back pain.  Neurological:  Negative for dizziness, weakness and light-headedness.  All other systems reviewed and are negative.   Physical Exam Triage Vital Signs ED Triage Vitals  Enc Vitals Group     BP 07/24/21 1044 (!) 135/55     Pulse Rate 07/24/21 1044 61     Resp 07/24/21 1044 16     Temp 07/24/21 1044  (!) 97.5 F (36.4 C)     Temp Source 07/24/21 1044 Oral     SpO2 07/24/21 1044 97 %     Weight 07/24/21 1047 120 lb (54.4 kg)     Height 07/24/21 1047 '5\' 3"'$  (1.6 m)     Head Circumference --      Peak Flow --      Pain Score 07/24/21 1047 8     Pain Loc --      Pain Edu? --      Excl. in [place]? --    No data found.  Updated Vital Signs BP (!) 135/55 (BP Location: Right Arm)   Pulse 61   Temp (!) 97.5 F (36.4 C) (Oral)   Resp 16   Ht '5\' 3"'$  (1.6 m)   Wt 120 lb (54.4 kg)   SpO2 97%   BMI 21.26 kg/m   Visual Acuity Right Eye Distance:   Left Eye Distance:   Bilateral Distance:  Right Eye Near:   Left Eye Near:    Bilateral Near:     Physical Exam Vitals reviewed.  Constitutional:      General: She is not in acute distress.    Appearance: Normal appearance. She is not ill-appearing.  HENT:     Head: Normocephalic and atraumatic.     Mouth/Throat:     Mouth: Mucous membranes are moist.     Comments: Moist mucous membranes Eyes:     Extraocular Movements: Extraocular movements intact.     Pupils: Pupils are equal, round, and reactive to light.  Cardiovascular:     Rate and Rhythm: Normal rate and regular rhythm.     Heart sounds: Normal heart sounds.  Pulmonary:     Effort: Pulmonary effort is normal.     Breath sounds: Normal breath sounds. No wheezing, rhonchi or rales.  Abdominal:     General: Bowel sounds are normal. There is no distension.     Palpations: Abdomen is soft. There is no mass.     Tenderness: There is no abdominal tenderness. There is no right CVA tenderness, left CVA tenderness, guarding or rebound.  Skin:    General: Skin is warm.     Capillary Refill: Capillary refill takes less than 2 seconds.     Comments: Good skin turgor  Neurological:     General: No focal deficit present.     Mental Status: She is alert and oriented to person, place, and time.  Psychiatric:        Mood and Affect: Mood normal.        Behavior: Behavior normal.      UC Treatments / Results  Labs (all labs ordered are listed, but only abnormal results are displayed) Labs Reviewed  URINALYSIS, ROUTINE W REFLEX MICROSCOPIC - Abnormal; Notable for the following components:      Result Value   APPearance CLOUDY (*)    Hgb urine dipstick MODERATE (*)    Ketones, ur TRACE (*)    Protein, ur 30 (*)    Nitrite POSITIVE (*)    Leukocytes,Ua LARGE (*)    All other components within normal limits  URINALYSIS, MICROSCOPIC (REFLEX) - Abnormal; Notable for the following components:   Bacteria, UA MANY (*)    All other components within normal limits  URINE CULTURE    EKG   Radiology No results found.  Procedures Procedures (including critical care time)  Medications Ordered in UC Medications - No data to display  Initial Impression / Assessment and Plan / UC Course  I have reviewed the triage vital signs and the nursing notes.  Pertinent labs & imaging results that were available during my care of the patient were reviewed by me and considered in my medical decision making (see chart for details).     This patient is a very pleasant 88 y.o. year old female presenting with acute cystitis with hematuria. Afebrile, nontachycardic, no reproducible abd pain or CVAT.  UA with moderate blood, positive nitrite, large leuk.  Culture sent.  She has not taken Azo. Her UTI on 07/08/2021 was managed with Keflex with resolution.  Culture showed susceptibility to this.  Given recurrent symptoms for 2 days, will manage with ciprofloxacin today. Denies vaginal symptoms, but Diflucan sent for yeast prophylaxis.  ED return precautions discussed. Patient verbalizes understanding and agreement.    Final Clinical Impressions(s) / UC Diagnoses   Final diagnoses:  Acute cystitis with hematuria     Discharge Instructions      -  Ciprofloxacin twice daily x7 days -Diflucan for yeast prevention -Drink plenty of water -Follow-up with [place] or PCP if new  symptoms - abdominal pain, back pain, etc.    ED Prescriptions     Medication Sig Dispense Auth. Provider   fluconazole (DIFLUCAN) 150 MG tablet Take 1 tablet (150 mg total) by mouth daily. -For your yeast infection, start the Diflucan (fluconazole)- Take one pill today (day 1). If you're still having symptoms in 3 days, take the second pill. 2 tablet Hazel Sams, PA-C   ciprofloxacin (CIPRO) 500 MG tablet Take 1 tablet (500 mg total) by mouth 2 (two) times daily for 7 days. 14 tablet Hazel Sams, PA-C      PDMP not reviewed this encounter.   Hazel Sams, PA-C 07/24/21 1144

## 2021-07-24 NOTE — ED Triage Notes (Signed)
Patient in today c/o painful urination and urinary frequency x 2 days. Patient states she had a UTI ~3 weeks ago. Patient was seen at [facility] and prescribed Keflex and Diflucan. Patient states she completed medications and symptoms resolved.

## 2021-07-24 NOTE — Discharge Instructions (Addendum)
-  Ciprofloxacin twice daily x7 days -Diflucan for yeast prevention -Drink plenty of water -Follow-up with [place] or PCP if new symptoms - abdominal pain, back pain, etc.

## 2021-07-27 LAB — URINE CULTURE: Culture: >=100000 — AB

## 2021-07-29 DIAGNOSIS — Z08 Encounter for follow-up examination after completed treatment for malignant neoplasm: Secondary | ICD-10-CM | POA: Diagnosis not present

## 2021-07-29 DIAGNOSIS — Z79899 Other long term (current) drug therapy: Secondary | ICD-10-CM | POA: Diagnosis not present

## 2021-07-29 DIAGNOSIS — L400 Psoriasis vulgaris: Secondary | ICD-10-CM | POA: Diagnosis not present

## 2021-07-29 DIAGNOSIS — L439 Lichen planus, unspecified: Secondary | ICD-10-CM | POA: Diagnosis not present

## 2021-07-29 DIAGNOSIS — D485 Neoplasm of uncertain behavior of skin: Secondary | ICD-10-CM | POA: Diagnosis not present

## 2021-07-29 DIAGNOSIS — Z85828 Personal history of other malignant neoplasm of skin: Secondary | ICD-10-CM | POA: Diagnosis not present

## 2021-07-29 DIAGNOSIS — L821 Other seborrheic keratosis: Secondary | ICD-10-CM | POA: Diagnosis not present

## 2021-08-17 ENCOUNTER — Other Ambulatory Visit: Payer: Self-pay | Admitting: Internal Medicine

## 2021-08-17 NOTE — Telephone Encounter (Signed)
Requested medication (s) are due for refill today:   Yes  Requested medication (s) are on the active medication list:   Yes  Future visit scheduled:   No   Last ordered: 07/16/2020 #90, 3 refills  Returned because prescribed by another provider - with [facility]   Requested Prescriptions  Pending Prescriptions Disp Refills   metoprolol tartrate (LOPRESSOR) 25 MG tablet [Pharmacy Med Name: METOPROLOL TARTRATE '25MG'$  TABLETS] 30 tablet     Sig: TAKE 1/2 TABLET BY MOUTH EVERY DAY     Cardiovascular:  Beta Blockers Passed - 08/17/2021  9:01 AM      Passed - Last BP in normal range    BP Readings from Last 1 Encounters:  07/24/21 (!) 135/55         Passed - Last Heart Rate in normal range    Pulse Readings from Last 1 Encounters:  07/24/21 61         Passed - Valid encounter within last 6 months    Recent Outpatient Visits           2 months ago Herpes zoster without complication   [facility] Glean Hess, MD   4 months ago Chronic nausea   [facility] Glean Hess, MD   7 months ago Annual physical exam   [facility] Glean Hess, MD   8 months ago Chronic vaginitis   [facility] Glean Hess, MD   9 months ago Chronic vaginitis   [facility] Glean Hess, MD       Future Appointments             In 3 weeks Furth, [place], PA-C [place], [facility]            me

## 2021-08-31 ENCOUNTER — Encounter: Payer: Self-pay | Admitting: Internal Medicine

## 2021-08-31 ENCOUNTER — Ambulatory Visit
Admission: RE | Admit: 2021-08-31 | Discharge: 2021-08-31 | Disposition: A | Discharge status: Home / Self Care | Payer: Medicare PPO | Attending: Internal Medicine | Admitting: Internal Medicine

## 2021-08-31 ENCOUNTER — Ambulatory Visit: Payer: Medicare PPO | Admitting: Internal Medicine

## 2021-08-31 ENCOUNTER — Ambulatory Visit
Admission: RE | Admit: 2021-08-31 | Discharge: 2021-08-31 | Disposition: A | Discharge status: Home / Self Care | Payer: Medicare PPO | Source: Ambulatory Visit | Attending: Internal Medicine | Admitting: Internal Medicine

## 2021-08-31 ENCOUNTER — Ambulatory Visit: Payer: Self-pay

## 2021-08-31 VITALS — BP 126/74 | HR 80 | Ht 63.0 in | Wt 124.0 lb

## 2021-08-31 DIAGNOSIS — R0781 Pleurodynia: Secondary | ICD-10-CM | POA: Diagnosis not present

## 2021-08-31 DIAGNOSIS — J479 Bronchiectasis, uncomplicated: Secondary | ICD-10-CM

## 2021-08-31 DIAGNOSIS — Z853 Personal history of malignant neoplasm of breast: Secondary | ICD-10-CM | POA: Diagnosis not present

## 2021-08-31 DIAGNOSIS — R062 Wheezing: Secondary | ICD-10-CM | POA: Diagnosis not present

## 2021-08-31 DIAGNOSIS — R0602 Shortness of breath: Secondary | ICD-10-CM | POA: Diagnosis not present

## 2021-08-31 DIAGNOSIS — R0789 Other chest pain: Secondary | ICD-10-CM | POA: Diagnosis not present

## 2021-09-08 ENCOUNTER — Encounter: Payer: Self-pay | Admitting: Medical

## 2021-09-08 ENCOUNTER — Ambulatory Visit: Payer: Medicare PPO | Admitting: Medical

## 2021-09-08 VITALS — BP 130/60 | HR 60 | Ht 63.0 in | Wt 124.1 lb

## 2021-09-08 DIAGNOSIS — R110 Nausea: Secondary | ICD-10-CM | POA: Diagnosis not present

## 2021-09-08 DIAGNOSIS — I480 Paroxysmal atrial fibrillation: Secondary | ICD-10-CM

## 2021-09-08 DIAGNOSIS — E782 Mixed hyperlipidemia: Secondary | ICD-10-CM | POA: Diagnosis not present

## 2021-09-08 DIAGNOSIS — I5032 Chronic diastolic (congestive) heart failure: Secondary | ICD-10-CM

## 2021-09-08 DIAGNOSIS — I10 Essential (primary) hypertension: Secondary | ICD-10-CM

## 2021-09-08 NOTE — Patient Instructions (Signed)
Medication Instructions:  Your physician recommends that you continue on your current medications as directed. Please refer to the Current Medication list given to you today.  *If you need a refill on your cardiac medications before your next appointment, please call your pharmacy*    Follow-Up: At [facility], you and your health needs are our priority.  As part of our continuing mission to provide you with exceptional heart care, we have created designated Provider Care Teams.  These Care Teams include your primary Cardiologist (physician) and Advanced Practice Providers (APPs -  Physician Assistants and Nurse Practitioners) who all work together to provide you with the care you need, when you need it.  We recommend signing up for the patient portal called "MyChart".  Sign up information is provided on this After Visit Summary.  MyChart is used to connect with patients for Virtual Visits (Telemedicine).  Patients are able to view lab/test results, encounter notes, upcoming appointments, etc.  Non-urgent messages can be sent to your provider as well.   To learn more about what you can do with MyChart, go to [email or web address].    Your next appointment:   6 month(s)  The format for your next appointment:   In Person  Provider:   You may see Nelva Bush, MD or one of the following Advanced Practice Providers on your designated Care Team:   Murray Hodgkins, NP Christell Faith, PA-C Cadence Kathlen Mody, [place]    Other Instructions   Important Information About [place]

## 2021-09-08 NOTE — Progress Notes (Signed)
Cardiology Office Note:    Date:  09/08/2021   ID:  [name], DOB August 24, 1933, MRN [id number]  PCP:  Olivia Hess, MD  [facility] Cardiologist:  Olivia Bush, MD  [facility] Electrophysiologist:  None   Referring MD: Olivia Hess, MD   Chief Complaint: 6 month follow-up  History of Present Illness:    [name] is a 88 y.o. female with a hx of afib, HTN, GERD, COPD on O2 at night, HLD, history of breast cancer s/p chemo and radiation who is being seen for 1 month follow-up.    Prior to her admission in 06/2020 she had not been seen by a cardiologist.    On 06/2020, she presented to [name] ED s/p a fall and found to have L hip fx.  D-dimer elevated with chest CTA negative for pulmonary embolism.  On 4/1, she underwent left femoral neck pinning.  On 4/5, she was in new onset atrial fibrillation/flutter with RVR thought precipitated by left femoral hip fracture. Digoxin 250 MCG x1 was administered to assist for rate control.  She converted to NSR during admission with amiodarone added to maintain NSR.  Given a CHA2DS2-VASc score of at least 48 (age x2, gender, hypertension, coronary calcium noted on CT, female), she was transitioned to Eliquis 5 mg twice daily.  Volume overload was suspected given small pleural effusions on chest CT and crackles on exam.  Due to soft BP, net even to slightly negative fluid balance was recommended.  At discharge, she was continued on amiodarone 400 mg twice daily x6 days, subsequently reduced to 200 mg twice daily.     It was noted that, as an outpatient, she should be weaned to 200 mg daily.  Outpatient sleep evaluation was noted in the setting of RVSP elevation as below.  Low-dose oral diuretic was noted to possibly be needed in the future.   Subsequent echo showed EF 65 to 70%, NRWMA, mild LVH, G2 DD.  RVSP 49.5 mmHg/pulmonary hypertension, mild LAE.  Mitral valve was noted to be due to degenerative with mild mitral stenosis.  Estimated  RAP 3 mmHg.   She saw her PCP and reported GI issues with amiodarone discontinued.  Subsequently, she stopped all medications for a week to see if this would improve her GI issues.  This did not help, so she restarted her medications.     On 07/16/2020, she was seen in clinic and reported ongoing issues despite discontinuation of her amiodarone.  She was maintaining NSR.  She had restarted all of her cardiac medications today before clinic.     On 08/31/2020, she called the office to report nausea, attributed to apixaban.  Recommendation by her primary cardiologist was to hold the medication for at least 1 week to see if symptoms resolve.  Patient and family were notified that discontinuation of anticoagulation increase the risk of thrombolic events, including strokes   Seen 09/14/20 and reported symptoms improved off Eliquis, so this was stopped. She was started on Xarelto '15mg'$  daily, however this also caused GI issues and she was started on Pradaxa.   Last seen 12/02/21 and was tolerating Pradaxa OK.   Today, the patient is overall doing well. She is chronic shortness of breath on exertion. No chest pain, lower leg edema, orthonea, or pnd. She uses O2 at night. She does not nave OSA. She has a question about the metoprolol, she should be taking it twice daily. EKG shows NSR. She is still having nausea,  but is able to eat and drink. She uses Phenergan as needed.     Past Medical History:  Diagnosis Date   Acute metabolic encephalopathy 34/74/2595   Allergy    Bronchiectasis ([place])    Cancer (HCC)    left breast cancer    COPD (chronic obstructive pulmonary disease) (HCC)    GERD (gastroesophageal reflux disease)    Hyperlipemia    Hypertension    Intractable nausea and vomiting 06/20/2018    Past Surgical History:  Procedure Laterality Date   BREAST LUMPECTOMY Left 2008   CHOLECYSTECTOMY     HIP PINNING,CANNULATED Left 06/05/2020   Procedure: CANNULATED HIP PINNING;  Surgeon: Olivia Knows, MD;  Location: [facility] ORS;  Service: Orthopedics;  Laterality: Left;   PARTIAL HYSTERECTOMY     TEE WITHOUT CARDIOVERSION N/A 06/10/2020   Procedure: TRANSESOPHAGEAL ECHOCARDIOGRAM (TEE);  Surgeon: Olivia Sable, MD;  Location: [facility] ORS;  Service: Cardiovascular;  Laterality: N/A;    Current Medications: Current Meds  Medication Sig   atorvastatin (LIPITOR) 20 MG tablet Take 1 tablet (20 mg total) by mouth daily.   cetirizine (ZYRTEC) 10 MG tablet Take 1 tablet (10 mg total) by mouth daily.   famotidine (PEPCID) 10 MG tablet Take 10 mg by mouth daily.   fluconazole (DIFLUCAN) 150 MG tablet Take 1 tablet (150 mg total) by mouth daily. -For your yeast infection, start the Diflucan (fluconazole)- Take one pill today (day 1). If you're still having symptoms in 3 days, take the second pill.   fluticasone (FLONASE) 50 MCG/ACT nasal spray SHAKE LIQUID AND USE 2 SPRAYS IN EACH NOSTRIL DAILY AS NEEDED FOR ALLERGIES OR RHINITIS   folic acid (FOLVITE) 1 MG tablet Take 1 mg by mouth daily.   hydrochlorothiazide (MICROZIDE) 12.5 MG capsule Take 1 capsule (12.5 mg total) by mouth daily.   methotrexate (RHEUMATREX) 2.5 MG tablet Take 2.5 mg by mouth once a week. Caution:Chemotherapy. Protect from light.   metoprolol tartrate (LOPRESSOR) 25 MG tablet TAKE 1/2 TABLET BY MOUTH EVERY DAY (Patient taking differently: Take 12.5 mg by mouth 2 (two) times daily.)   OXYGEN Inhale into the lungs. 2 lpm [place] nightly   promethazine (PHENERGAN) 12.5 MG tablet Take 1 tablet (12.5 mg total) by mouth every 8 (eight) hours as needed for nausea or vomiting.   Rivaroxaban (XARELTO) 15 MG TABS tablet Take 1 tablet (15 mg total) by mouth daily.     Allergies:   Amoxicillin and Eliquis [apixaban]   Social History   Socioeconomic History   Marital status: Married    Spouse name: Not on file   Number of children: 2   Years of education: Not on file   Highest education level: Not on file  Occupational History    Not on file  Tobacco Use   Smoking status: Never   Smokeless tobacco: Never  Vaping Use   Vaping Use: Never used  Substance and Sexual Activity   Alcohol use: No   Drug use: No   Sexual activity: Not on file  Other Topics Concern   Not on file  Social History Narrative   Not on file   Social Determinants of Health   Financial Resource Strain: Low Risk  (02/17/2021)   Overall Financial Resource Strain (CARDIA)    Difficulty of Paying Living Expenses: Not hard at all  Food Insecurity: No Food Insecurity (02/17/2021)   Hunger Vital Sign    Worried About Running Out of Food in the Last Year: Never true  Ran Out of Food in the Last Year: Never true  Transportation Needs: No Transportation Needs (02/17/2021)   PRAPARE - Hydrologist (Medical): No    Lack of Transportation (Non-Medical): No  Physical Activity: Sufficiently Active (02/17/2021)   Exercise Vital Sign    Days of Exercise per Week: 7 days    Minutes of Exercise per Session: 30 min  Stress: No Stress Concern Present (02/17/2021)   [place]    Feeling of Stress : Not at all  Social Connections: Moderately Isolated (02/17/2021)   Social Connection and Isolation Panel [NHANES]    Frequency of Communication with Friends and Family: More than three times a week    Frequency of Social Gatherings with Friends and Family: Once a week    Attends Religious Services: Never    Marine scientist or Organizations: No    Attends Music therapist: Never    Marital Status: Married     Family History: The patient's family history includes Breast cancer in her sister.  ROS:   Please see the history of present illness.     All other systems reviewed and are negative.  EKGs/Labs/Other Studies Reviewed:    The following studies were reviewed today:  Echocardiogram 06/10/2020  1. Left ventricular ejection fraction,  by estimation, is 65 to 70%. The  left ventricle has normal function. The left ventricle has no regional  wall motion abnormalities. There is mild left ventricular hypertrophy.  Left ventricular diastolic parameters  are consistent with Grade II diastolic dysfunction (pseudonormalization).   2. Right ventricular systolic function is normal. The right ventricular  size is normal. There is moderately elevated pulmonary artery systolic  pressure. The estimated right ventricular systolic pressure is 82.9 mmHg.   3. Left atrial size was mildly dilated.   4. The mitral valve is degenerative. No evidence of mitral valve  regurgitation. Mild mitral stenosis.   5. The aortic valve is tricuspid. Aortic valve regurgitation is not  visualized.   6. The inferior vena cava is normal in size with greater than 50%  respiratory variability, suggesting right atrial pressure of 3 mmHg.    MPI 10/08/2013 Pharmacological myocardial perfusion study without significant ischemia No significant wall motion abnormality noted EF 82% LV SF normal Overall low risk scan    EKG:  EKG is  ordered today.  The ekg ordered today demonstrates NSR 60bpm, nonspecific T wave changes  Recent Labs: 01/14/2021: ALT 15; BUN 20; Creatinine, Ser 0.84; Hemoglobin 13.9; Platelets 235; Potassium 4.0; Sodium 141; TSH 5.440  Recent Lipid Panel    Component Value Date/Time   CHOL 208 (H) 01/14/2021 0855   CHOL 280 (H) 10/08/2013 0516   TRIG 113 01/14/2021 0855   TRIG 112 10/08/2013 0516   HDL 98 01/14/2021 0855   HDL 106 (H) 10/08/2013 0516   CHOLHDL 2.1 01/14/2021 0855   VLDL 22 10/08/2013 0516   LDLCALC 91 01/14/2021 0855   LDLCALC 152 (H) 10/08/2013 0516      Physical Exam:    VS:  BP 130/60 (BP Location: Right Arm, Patient Position: Sitting, Cuff Size: Normal)   Pulse 60   Ht '5\' 3"'$  (1.6 m)   Wt 124 lb 2 oz (56.3 kg)   SpO2 95%   BMI 21.99 kg/m     Wt Readings from Last 3 Encounters:  09/08/21 124 lb 2 oz  (56.3 kg)  08/31/21 124 lb (56.2 kg)  07/24/21 120 lb (54.4 kg)     GEN:  Well nourished, well developed in no acute distress HEENT: Normal NECK: No JVD; No carotid bruits LYMPHATICS: No lymphadenopathy CARDIAC: RRR, no murmurs, rubs, gallops RESPIRATORY:  Clear to auscultation without rales, wheezing or rhonchi  ABDOMEN: Soft, non-tender, non-distended MUSCULOSKELETAL:  No edema; No deformity  SKIN: Warm and dry NEUROLOGIC:  Alert and oriented x 3 PSYCHIATRIC:  Normal affect   ASSESSMENT:    1. Paroxysmal A-fib ([place])   2. Essential hypertension   3. Hyperlipidemia, mixed   4. Chronic diastolic heart failure ([place])   5. Nausea    PLAN:    In order of problems listed above:  Paroxysmal Afib EKG shows NSR with a heart rate of 60bpm. Continue Lopressor 12.'5mg'$ BID for rate control. She is on Xarelto for stroke ppx. CHADSVASC of at least 6 (CHF, HTN, agex2, PAD, female. She previously did not tolerate amiodarone or Eliquis due to nausea.   HTN BP is good today. Continue current medications.   HFpEF Echo 06/2021 showed LVEF 60-65%, G2DD, mild LVH. She is euvolemic on exam. Continue BB therapy and HCTZ.  GI issues Patient reports persistent nasuea at times. She uses Phenergan as needed. She is still able to eat and drink despite symptoms.   Disposition: Follow up in 6 month(s) with MD/APP     Signed, Lenoard Helbert Ninfa Meeker, PA-C  09/08/2021 12:28 PM    [facility]

## 2021-09-14 ENCOUNTER — Other Ambulatory Visit: Payer: Self-pay | Admitting: Nurse Practitioner

## 2021-09-14 NOTE — Telephone Encounter (Signed)
Pt last saw Cadence Furth, [place] on 09/08/21, last labs 01/14/21 Creat 0.84, age 88, weight 56.3kg, CrCl 43.52, based on CrCl pt is on appropriate dosage of Eliquis '15mg'$  QD for afib.  Will refill rx.

## 2021-10-05 DIAGNOSIS — L4050 Arthropathic psoriasis, unspecified: Secondary | ICD-10-CM | POA: Diagnosis not present

## 2021-10-05 DIAGNOSIS — F411 Generalized anxiety disorder: Secondary | ICD-10-CM | POA: Diagnosis not present

## 2021-10-05 DIAGNOSIS — J439 Emphysema, unspecified: Secondary | ICD-10-CM | POA: Diagnosis not present

## 2021-10-05 DIAGNOSIS — K219 Gastro-esophageal reflux disease without esophagitis: Secondary | ICD-10-CM | POA: Diagnosis not present

## 2021-10-05 DIAGNOSIS — J309 Allergic rhinitis, unspecified: Secondary | ICD-10-CM | POA: Diagnosis not present

## 2021-10-05 DIAGNOSIS — I4891 Unspecified atrial fibrillation: Secondary | ICD-10-CM | POA: Diagnosis not present

## 2021-10-05 DIAGNOSIS — I10 Essential (primary) hypertension: Secondary | ICD-10-CM | POA: Diagnosis not present

## 2021-10-05 DIAGNOSIS — D6869 Other thrombophilia: Secondary | ICD-10-CM | POA: Diagnosis not present

## 2021-10-05 DIAGNOSIS — J471 Bronchiectasis with (acute) exacerbation: Secondary | ICD-10-CM | POA: Diagnosis not present

## 2021-10-07 ENCOUNTER — Other Ambulatory Visit: Payer: Self-pay | Admitting: Internal Medicine

## 2021-10-07 DIAGNOSIS — I700 Atherosclerosis of aorta: Secondary | ICD-10-CM

## 2021-10-07 NOTE — Telephone Encounter (Signed)
Requested Prescriptions  Pending Prescriptions Disp Refills  . atorvastatin (LIPITOR) 20 MG tablet [Pharmacy Med Name: ATORVASTATIN '20MG'$  TABLETS] 90 tablet 0    Sig: TAKE 1 TABLET(20 MG) BY MOUTH DAILY     Cardiovascular:  Antilipid - Statins Failed - 10/07/2021 10:09 AM      Failed - Lipid Panel in normal range within the last 12 months    Cholesterol, Total  Date Value Ref Range Status  01/14/2021 208 (H) 100 - 199 mg/dL Final   Cholesterol  Date Value Ref Range Status  10/08/2013 280 (H) 0 - 200 mg/dL Final   Ldl Cholesterol, Calc  Date Value Ref Range Status  10/08/2013 152 (H) 0 - 100 mg/dL Final   LDL Chol Calc (NIH)  Date Value Ref Range Status  01/14/2021 91 0 - 99 mg/dL Final   HDL Cholesterol  Date Value Ref Range Status  10/08/2013 106 (H) 40 - 60 mg/dL Final   HDL  Date Value Ref Range Status  01/14/2021 98 >39 mg/dL Final   Triglycerides  Date Value Ref Range Status  01/14/2021 113 0 - 149 mg/dL Final  10/08/2013 112 0 - 200 mg/dL Final         Passed - Patient is not pregnant      Passed - Valid encounter within last 12 months    Recent Outpatient Visits          1 month ago Rib pain on left side   [facility] Glean Hess, MD   4 months ago Herpes zoster without complication   [facility] Glean Hess, MD   6 months ago Chronic nausea   [facility] Glean Hess, MD   8 months ago Annual physical exam   [facility] Glean Hess, MD   10 months ago Chronic vaginitis   [facility] Glean Hess, MD

## 2021-10-08 ENCOUNTER — Telehealth: Payer: Self-pay | Admitting: Internal Medicine

## 2021-10-08 NOTE — Telephone Encounter (Signed)
Attempted to call pt's husband back. No answer. No voicemail set up.

## 2021-10-08 NOTE — Telephone Encounter (Signed)
Pt c/o medication issue:  1. Name of Medication: metoprolol tartrate (LOPRESSOR) 25 MG tablet  2. How are you currently taking this medication (dosage and times per day)? TAKE 1/2 TABLET BY MOUTH EVERY DAYPatient taking differently: Take 12.5 mg by mouth 2 (two) times daily.  3. Are you having a reaction (difficulty breathing--STAT)? no  4. What is your medication issue? Husband called in with question concerning this medication. Please advise

## 2021-10-11 MED ORDER — METOPROLOL TARTRATE 25 MG PO TABS: 12.5000 mg | ORAL_TABLET | Freq: Two times a day (BID) | ORAL | 3 refills | Status: DC

## 2021-10-11 NOTE — Telephone Encounter (Signed)
I spoke with the patient's husband, ok per DPR. He advised that the patient's last refill for metoprolol was sent to Dr. Gaspar Cola office from the pharmacy. The patient is currently taking: - Metoprolol tartrate 25 mg- 0.5 tablet (12.5 mg) by mouth twice daily  However, Dr. Gaspar Cola office wrote this for: - metoprolol tartrate 25 mg- 0.5 tablet (12.5 mg) once daliy  Per Mr. Aguinaga, the patient's RX is running out.  I advised I would update the patient's RX at the pharmacy with the correct sig. Mr. Quest voices understanding and was appreciative of the call back.

## 2021-11-04 ENCOUNTER — Ambulatory Visit: Payer: Self-pay | Admitting: *Deleted

## 2021-11-04 NOTE — Telephone Encounter (Signed)
Reason for Disposition  [1] MILD difficulty breathing (e.g., minimal/no SOB at rest, SOB with walking, pulse <100) AND [2] NEW-onset or WORSE than normal  Answer Assessment - Initial Assessment Questions 1. RESPIRATORY STATUS: "Describe your breathing?" (e.g., wheezing, shortness of breath, unable to speak, severe coughing)      SOB, nausea 2. ONSET: "When did this breathing problem begin?"      SOB- worse recently, nausea- 1 1/2 yr 3. PATTERN "Does the difficult breathing come and go, or has it been constant since it started?"      Comes and goes- exertion 4. SEVERITY: "How bad is your breathing?" (e.g., mild, moderate, severe)    - MILD: No SOB at rest, mild SOB with walking, speaks normally in sentences, can lie down, no retractions, pulse < 100.    - MODERATE: SOB at rest, SOB with minimal exertion and prefers to sit, cannot lie down flat, speaks in phrases, mild retractions, audible wheezing, pulse 100-120.    - SEVERE: Very SOB at rest, speaks in single words, struggling to breathe, sitting hunched forward, retractions, pulse > 120      mild 5. RECURRENT SYMPTOM: "Have you had difficulty breathing before?" If Yes, ask: "When was the last time?" and "What happened that time?"      Yes- COPD, asthma 6. CARDIAC HISTORY: "Do you have any history of heart disease?" (e.g., heart attack, angina, bypass surgery, angioplasty)      Afib history 7. LUNG HISTORY: "Do you have any history of lung disease?"  (e.g., pulmonary embolus, asthma, emphysema)     Yes-asthma, COPD 8. CAUSE: "What do you think is causing the breathing problem?"      Not sure 9. OTHER SYMPTOMS: "Do you have any other symptoms? (e.g., dizziness, runny nose, cough, chest pain, fever)     Chest pain with eating- GERD 10. O2 SATURATION MONITOR:  "Do you use an oxygen saturation monitor (pulse oximeter) at home?" If Yes, ask: "What is your reading (oxygen level) today?" "What is your usual oxygen saturation reading?" (e.g., 95%)        93-98% recently, today , 97% 81P 11. PREGNANCY: "Is there any chance you are pregnant?" "When was your last menstrual period?"       *No Answer* 12. TRAVEL: "Have you traveled out of the country in the last month?" (e.g., travel history, exposures)       *No Answer*  Protocols used: Breathing Difficulty-A-AH

## 2021-11-04 NOTE — Telephone Encounter (Signed)
  Chief Complaint: SOB- worse with exertion Symptoms: SOB with exertion, ongoing nausea-chronic Frequency: hx asthma, COPD- seems to be getting worse- was only able to do 9 rounds of golf yesterday Pertinent Negatives: Patient denies dizziness, runny nose, cough, chest pain, fever Disposition: '[]'$ ED /'[]'$ Urgent Care (no appt availability in office) / '[x]'$ Appointment(In office/virtual)/ '[]'$  [facility]/ '[]'$ Home Care/ '[]'$ Refused Recommended Disposition /'[]'$ [facility]/ '[]'$  Follow-up with PCP Additional Notes: Patient scheduled for am appointment- advised to use inhaler- she has not used- call back if worse in any way

## 2021-11-05 ENCOUNTER — Ambulatory Visit: Payer: Medicare PPO | Admitting: Internal Medicine

## 2021-11-05 ENCOUNTER — Encounter: Payer: Self-pay | Admitting: Internal Medicine

## 2021-11-05 VITALS — BP 110/82 | HR 82 | Ht 63.0 in | Wt 125.0 lb

## 2021-11-05 DIAGNOSIS — I10 Essential (primary) hypertension: Secondary | ICD-10-CM | POA: Diagnosis not present

## 2021-11-05 DIAGNOSIS — R110 Nausea: Secondary | ICD-10-CM | POA: Diagnosis not present

## 2021-11-05 DIAGNOSIS — J3489 Other specified disorders of nose and nasal sinuses: Secondary | ICD-10-CM | POA: Diagnosis not present

## 2021-11-05 DIAGNOSIS — S91209A Unspecified open wound of unspecified toe(s) with damage to nail, initial encounter: Secondary | ICD-10-CM

## 2021-11-05 MED ORDER — AZELASTINE HCL 0.1 % NA SOLN: 1.0000 | Freq: Two times a day (BID) | NASAL | 12 refills | Status: DC

## 2021-11-05 MED ORDER — ONDANSETRON HCL 4 MG PO TABS: 4.0000 mg | ORAL_TABLET | Freq: Three times a day (TID) | ORAL | 1 refills | Status: DC | PRN

## 2021-11-05 NOTE — Progress Notes (Signed)
Date:  11/05/2021   Name:  [name]   DOB:  1933-11-22   MRN:  [id number]   Chief Complaint: Shortness of Breath (With exertion, getting worse when walking or with activity. Also, still having issues with nausea- no vomiting. ) and Nail Problem (Right foot, 2nd toe - toenail is falling off. Pt says she did not hit her foot, or has not had any injury to it.)  Shortness of Breath This is a recurrent problem. The current episode started 1 to 4 weeks ago. The problem occurs intermittently. The problem has been gradually worsening. Associated symptoms include rhinorrhea. Pertinent negatives include no abdominal pain, chest pain, fever, headaches, leg swelling or wheezing. Associated symptoms comments: nausea.    Lab Results  Component Value Date   NA 141 01/14/2021   K 4.0 01/14/2021   CO2 26 01/14/2021   GLUCOSE 91 01/14/2021   BUN 20 01/14/2021   CREATININE 0.84 01/14/2021   CALCIUM 9.8 01/14/2021   EGFR 68 01/14/2021   GFRNONAA 53 (L) 08/16/2020   Lab Results  Component Value Date   CHOL 208 (H) 01/14/2021   HDL 98 01/14/2021   LDLCALC 91 01/14/2021   TRIG 113 01/14/2021   CHOLHDL 2.1 01/14/2021   Lab Results  Component Value Date   TSH 5.440 (H) 01/14/2021   No results found for: "HGBA1C" Lab Results  Component Value Date   WBC 9.7 01/14/2021   HGB 13.9 01/14/2021   HCT 43.0 01/14/2021   MCV 89 01/14/2021   PLT 235 01/14/2021   Lab Results  Component Value Date   ALT 15 01/14/2021   AST 24 01/14/2021   ALKPHOS 87 01/14/2021   BILITOT 0.5 01/14/2021   Lab Results  Component Value Date   VD25OH 32.34 06/20/2020     Review of Systems  Constitutional:  Positive for fatigue. Negative for chills, fever and unexpected weight change.  HENT:  Positive for rhinorrhea. Negative for trouble swallowing.   Respiratory:  Positive for shortness of breath. Negative for cough, chest tightness and wheezing.   Cardiovascular:  Negative for chest pain, palpitations and  leg swelling.  Gastrointestinal:  Positive for nausea. Negative for abdominal pain.  Neurological:  Negative for headaches.  Psychiatric/Behavioral:  Negative for dysphoric mood. The patient is not nervous/anxious.     Patient Active Problem List   Diagnosis Date Noted   Chronic nausea 12/09/2020   Closed intracapsular fracture of left femur with routine healing 08/28/2020   Acquired thrombophilia ([place]) 07/15/2020   AF (paroxysmal atrial fibrillation) ([place]) 06/19/2020   Fracture of femoral neck, left, closed ([place]) 06/05/2020   Atherosclerosis of aorta ([place]) 01/09/2020   Stage 2 moderate COPD by GOLD classification ([place]) 01/09/2020   HSV (herpes simplex virus) infection 01/09/2020   BCC (basal cell carcinoma), face 10/12/2017   Environmental and seasonal allergies 10/30/2014   Psoriasis vulgaris 10/30/2014   Essential (primary) hypertension 10/30/2014   Gastro-esophageal reflux disease without esophagitis 10/30/2014   Hot flash, menopausal 10/30/2014   Hyperlipidemia, mixed 10/30/2014   History of cancer of left breast 10/30/2014   Bronchiectasis ([place]) 09/24/2013    Allergies  Allergen Reactions   Amoxicillin Nausea Only   Eliquis [Apixaban] Nausea Only    Past Surgical History:  Procedure Laterality Date   BREAST LUMPECTOMY Left 2008   CHOLECYSTECTOMY     HIP PINNING,CANNULATED Left 06/05/2020   Procedure: CANNULATED HIP PINNING;  Surgeon: Hessie Knows, MD;  Location: [facility] ORS;  Service: Orthopedics;  Laterality: Left;  PARTIAL HYSTERECTOMY     TEE WITHOUT CARDIOVERSION N/A 06/10/2020   Procedure: TRANSESOPHAGEAL ECHOCARDIOGRAM (TEE);  Surgeon: Kate Sable, MD;  Location: [facility] ORS;  Service: Cardiovascular;  Laterality: N/A;    Social History   Tobacco Use   Smoking status: Never   Smokeless tobacco: Never  Vaping Use   Vaping Use: Never used  Substance Use Topics   Alcohol use: No   Drug use: No     Medication list has been reviewed and  updated.  Current Meds  Medication Sig   atorvastatin (LIPITOR) 20 MG tablet TAKE 1 TABLET(20 MG) BY MOUTH DAILY   cetirizine (ZYRTEC) 10 MG tablet Take 1 tablet (10 mg total) by mouth daily.   famotidine (PEPCID) 10 MG tablet Take 10 mg by mouth daily.   fluconazole (DIFLUCAN) 150 MG tablet Take 1 tablet (150 mg total) by mouth daily. -For your yeast infection, start the Diflucan (fluconazole)- Take one pill today (day 1). If you're still having symptoms in 3 days, take the second pill.   fluticasone (FLONASE) 50 MCG/ACT nasal spray SHAKE LIQUID AND USE 2 SPRAYS IN EACH NOSTRIL DAILY AS NEEDED FOR ALLERGIES OR RHINITIS   folic acid (FOLVITE) 1 MG tablet Take 1 mg by mouth daily.   hydrochlorothiazide (MICROZIDE) 12.5 MG capsule Take 1 capsule (12.5 mg total) by mouth daily.   methotrexate (RHEUMATREX) 2.5 MG tablet Take 2.5 mg by mouth once a week. Caution:Chemotherapy. Protect from light.   metoprolol tartrate (LOPRESSOR) 25 MG tablet Take 0.5 tablets (12.5 mg total) by mouth 2 (two) times daily.   OXYGEN Inhale into the lungs. 2 lpm [place] nightly   promethazine (PHENERGAN) 12.5 MG tablet Take 1 tablet (12.5 mg total) by mouth every 8 (eight) hours as needed for nausea or vomiting.   triamcinolone (KENALOG) 0.025 % cream Apply 1 application topically 2 (two) times daily. To external genitalia   XARELTO 15 MG TABS tablet TAKE 1 TABLET(15 MG) BY MOUTH DAILY       11/05/2021    9:04 AM 08/31/2021    1:39 PM 05/26/2021   10:31 AM 03/22/2021    1:37 PM  GAD 7 : Generalized Anxiety Score  Nervous, Anxious, on Edge 0 0 0 3  Control/stop worrying 0 0 0 0  Worry too much - different things 0 0 0 2  Trouble relaxing 0 0 0 2  Restless 0 0 0 0  Easily annoyed or irritable 0 0 0 2  Afraid - awful might happen 0 0 0 1  Total GAD 7 Score 0 0 0 10  Anxiety Difficulty Not difficult at all Not difficult at all Not difficult at all Somewhat difficult       11/05/2021    9:04 AM 08/31/2021    1:39 PM  05/26/2021   10:31 AM  Depression screen PHQ 2/9  Decreased Interest 0 0 0  Down, Depressed, Hopeless 0 0 0  PHQ - 2 Score 0 0 0  Altered sleeping 0 0 0  Tired, decreased energy 3 3 0  Change in appetite 3 3 0  Feeling bad or failure about yourself  0 0 0  Trouble concentrating 0 0 0  Moving slowly or fidgety/restless 1 0 0  Suicidal thoughts 0 0 0  PHQ-9 Score 7 6 0  Difficult doing work/chores Not difficult at all Not difficult at all Not difficult at all    BP Readings from Last 3 Encounters:  11/05/21 110/82  09/08/21 130/60  08/31/21 126/74  Physical Exam Vitals and nursing note reviewed.  Constitutional:      General: She is not in acute distress.    Appearance: She is well-developed.  HENT:     Head: Normocephalic and atraumatic.     Nose:     Right Sinus: No maxillary sinus tenderness.     Left Sinus: No maxillary sinus tenderness.  Cardiovascular:     Rate and Rhythm: Normal rate and regular rhythm.     Pulses: Normal pulses.     Heart sounds: Normal heart sounds.  Pulmonary:     Effort: Pulmonary effort is normal. No respiratory distress.     Breath sounds: No decreased breath sounds, wheezing or rhonchi.  Abdominal:     General: Abdomen is flat. Bowel sounds are normal.     Palpations: Abdomen is soft.     Tenderness: There is no abdominal tenderness.  Musculoskeletal:        General: Normal range of motion.     Right lower leg: No edema.     Left lower leg: No edema.  Feet:     Comments: Nail on right second toe loosened - no bleeding or sign of infection Skin:    General: Skin is warm and dry.     Capillary Refill: Capillary refill takes less than 2 seconds.     Findings: No rash.  Neurological:     Mental Status: She is alert and oriented to person, place, and time.  Psychiatric:        Mood and Affect: Mood normal.        Behavior: Behavior normal.     Wt Readings from Last 3 Encounters:  11/05/21 125 lb (56.7 kg)  09/08/21 124 lb 2 oz  (56.3 kg)  08/31/21 124 lb (56.2 kg)    BP 110/82   Pulse 82   Ht _0  (1.6 m)   Wt 125 lb (56.7 kg)   SpO2 92%   BMI 22.14 kg/m   Assessment and Plan: 1. Essential (primary) hypertension Clinically stable exam with well controlled BP. Tolerating medications without side effects at this time. Pt to continue current regimen and low sodium diet;   2. Rhinorrhea Continue Flonase PND could be contributing to nausea Will add astelin before meals twice a day - azelastine (ASTELIN) 0.1 % nasal spray; Place 1 spray into both nostrils 2 (two) times daily. Use in each nostril as directed  Dispense: 30 mL; Refill: 12  3. Chronic nausea Not responding well to phenergan Will try Zofran Consider restarting PPI  - ondansetron (ZOFRAN) 4 MG tablet; Take 1 tablet (4 mg total) by mouth every 8 (eight) hours as needed for nausea or vomiting.  Dispense: 60 tablet; Refill: 1  4. Avulsed toenail, initial encounter Local care only until the nail falls off   Partially dictated using Editor, commissioning. Any errors are unintentional.  Halina Maidens, MD [facility]  11/05/2021

## 2021-11-22 ENCOUNTER — Ambulatory Visit: Payer: Self-pay | Admitting: *Deleted

## 2021-11-22 NOTE — Telephone Encounter (Signed)
Summary: no appetite and nausea   Seen Dr. Army Melia recently but is not feeling better / pt stated she has to make herself eat and has nausea almost everyday / no appetite / please advise        Chief Complaint: nausea, no appetite  Symptoms: nausea throughout day. Will take phenergan or zofran and nausea eases off but returns. Reports drinking enough fluids but no appetite. Frequency: 1 year and 1/2 Pertinent Negatives: Patient denies fever, no vomiting  Disposition: '[]'$ ED /'[]'$ Urgent Care (no appt availability in office) / '[x]'$ Appointment(In office/virtual)/ '[]'$  [facility] [place] [facility]/ '[]'$ Home Care/ '[]'$ Refused Recommended Disposition /'[]'$ [place] [facility]/ '[]'$  Follow-up with PCP Additional Notes:   Appt 11/30/21     Reason for Disposition  Nausea is a chronic symptom (recurrent or ongoing AND present > 4 weeks)  Answer Assessment - Initial Assessment Questions 1. NAUSEA SEVERITY: "How bad is the nausea?" (e.g., mild, moderate, severe; dehydration, weight loss)   - MILD: loss of appetite without change in eating habits   - MODERATE: decreased oral intake without significant weight loss, dehydration, or malnutrition   - SEVERE: inadequate caloric or fluid intake, significant weight loss, symptoms of dehydration     Nausea no better  2. ONSET: "When did the nausea begin?"     1 year and 1/2 3. VOMITING: "Any vomiting?" If Yes, ask: "How many times today?"     no 4. RECURRENT SYMPTOM: "Have you had nausea before?" If Yes, ask: "When was the last time?" "What happened that time?"     Yes taking phenergan and eases but returns 5. CAUSE: "What do you think is causing the nausea?"     Not sure  6. PREGNANCY: "Is there any chance you are pregnant?" (e.g., unprotected intercourse, missed birth control pill, broken condom)     na  Protocols used: Nausea-A-AH

## 2021-11-23 NOTE — Telephone Encounter (Signed)
Called pt and informed. Patient said she has already seen 2 different providers in [place] for GI referrals, and both providers could not help her with the problem of nausea / no appetite. Spoke with Dr Army Melia - pt informed Dr Army Melia does not know how she can help further since pt has had a full work up. Told her to continue zofran, and eat when she can. Patient verbalized understanding.  - Sakia Schrimpf

## 2021-11-25 ENCOUNTER — Other Ambulatory Visit: Payer: Self-pay

## 2021-11-25 ENCOUNTER — Telehealth: Payer: Self-pay | Admitting: Internal Medicine

## 2021-11-25 DIAGNOSIS — R630 Anorexia: Secondary | ICD-10-CM

## 2021-11-25 DIAGNOSIS — R110 Nausea: Secondary | ICD-10-CM

## 2021-11-25 NOTE — Telephone Encounter (Signed)
Called pt let her know a referral has been placed.  Pt verbalized understanding.  KP

## 2021-11-25 NOTE — Telephone Encounter (Signed)
Copied from [place] [id number]. Topic: Referral - Request for Referral >> Nov 25, 2021 10:42 AM Tiffany B wrote: Patient was advised by PCP nurse to call back if she wanted a referral to gastro, patient would like referral placed at [facility]: Robet Leu MD. Patient would like a follow up call when completed.

## 2021-11-30 ENCOUNTER — Ambulatory Visit: Payer: Medicare PPO | Admitting: Internal Medicine

## 2021-12-01 NOTE — Telephone Encounter (Signed)
Referral placed to [facility].  KP

## 2021-12-01 NOTE — Telephone Encounter (Addendum)
Patient's husband called in , would like referral to go to [facility], instead because, the previous referral , [employer], appt they dont have until January and he says patients nausea is gettilng worse. [facility] phone number is [phone]

## 2021-12-07 NOTE — Telephone Encounter (Signed)
Pt called back, she is frustrated and upset. Sent message to referrals coordinator. She says she has been waiting for a week. [phone]

## 2021-12-07 NOTE — Telephone Encounter (Signed)
Called back and spoke with patients husband. Informed them that the referral was faxed to the office at [place], but it was never put in the [facility] referral system. Our referral coordinator put this in today, and the patient receive a call in a few days for this referral. Told husband they can try to call tomorrow to schedule an appointment, since this referral is now in [facility] system. He said "okay, thank you. "  - Yasheka Fossett

## 2021-12-07 NOTE — Telephone Encounter (Signed)
Sent referral coordinator a message to check on the status of this. Awaiting response.

## 2021-12-07 NOTE — Telephone Encounter (Signed)
Pt called to follow up on referral. Pt advised Dr Carolin Coy had placed referral to [facility] as requested. Pt verbalized understanding.

## 2021-12-07 NOTE — Telephone Encounter (Signed)
Pt called reporting that she just spoke to [facility] and they have no record of receiving a referral. She wants to speak with the clinic and resolve this today. Please advise

## 2021-12-08 ENCOUNTER — Telehealth: Payer: Self-pay

## 2021-12-08 IMAGING — DX DG ABDOMEN 1V
2 series · 2 of 2 positions shown · non-contrast
Comparison: None.

CLINICAL DATA: Weakness and nausea.

EXAM:
ABDOMEN - 1 VIEW

[abdomen supine (1 of 2)]
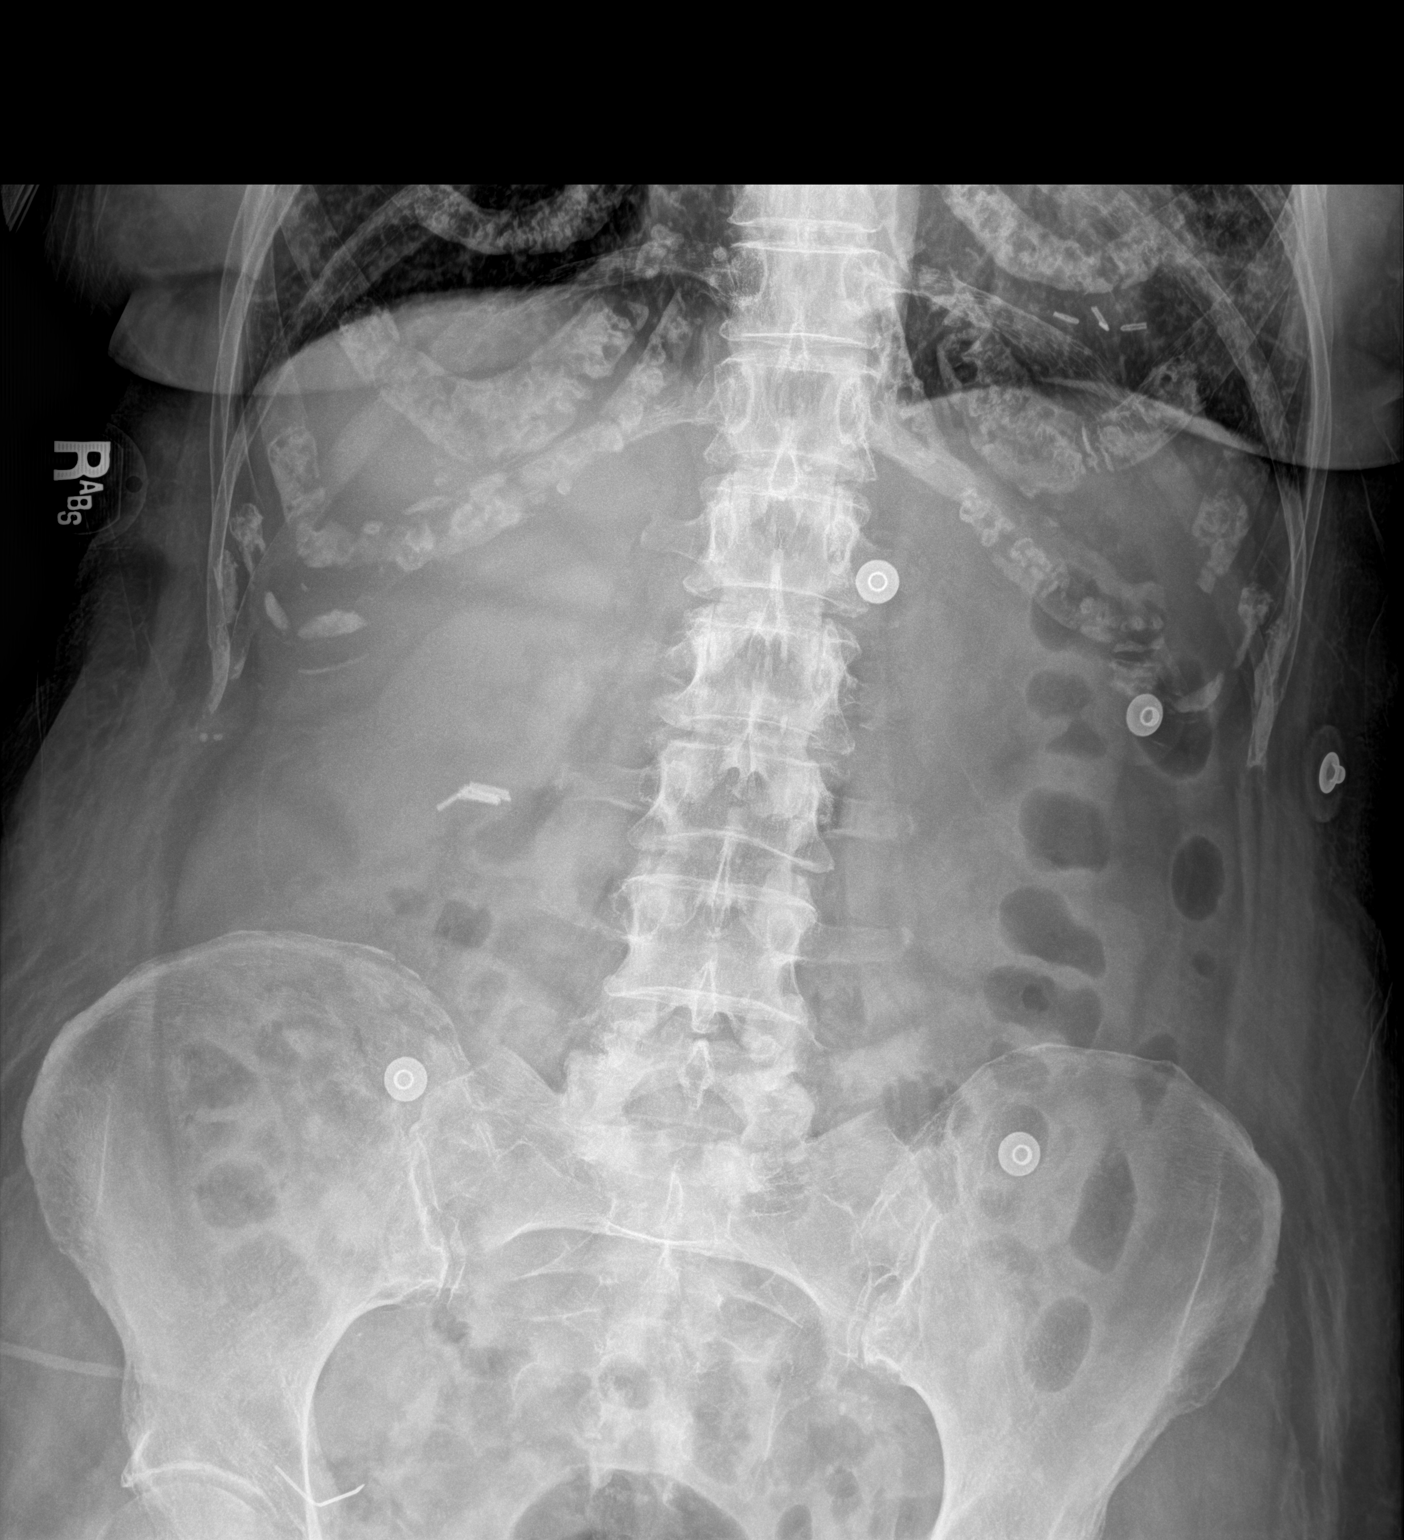

[abdomen supine (2 of 2)]
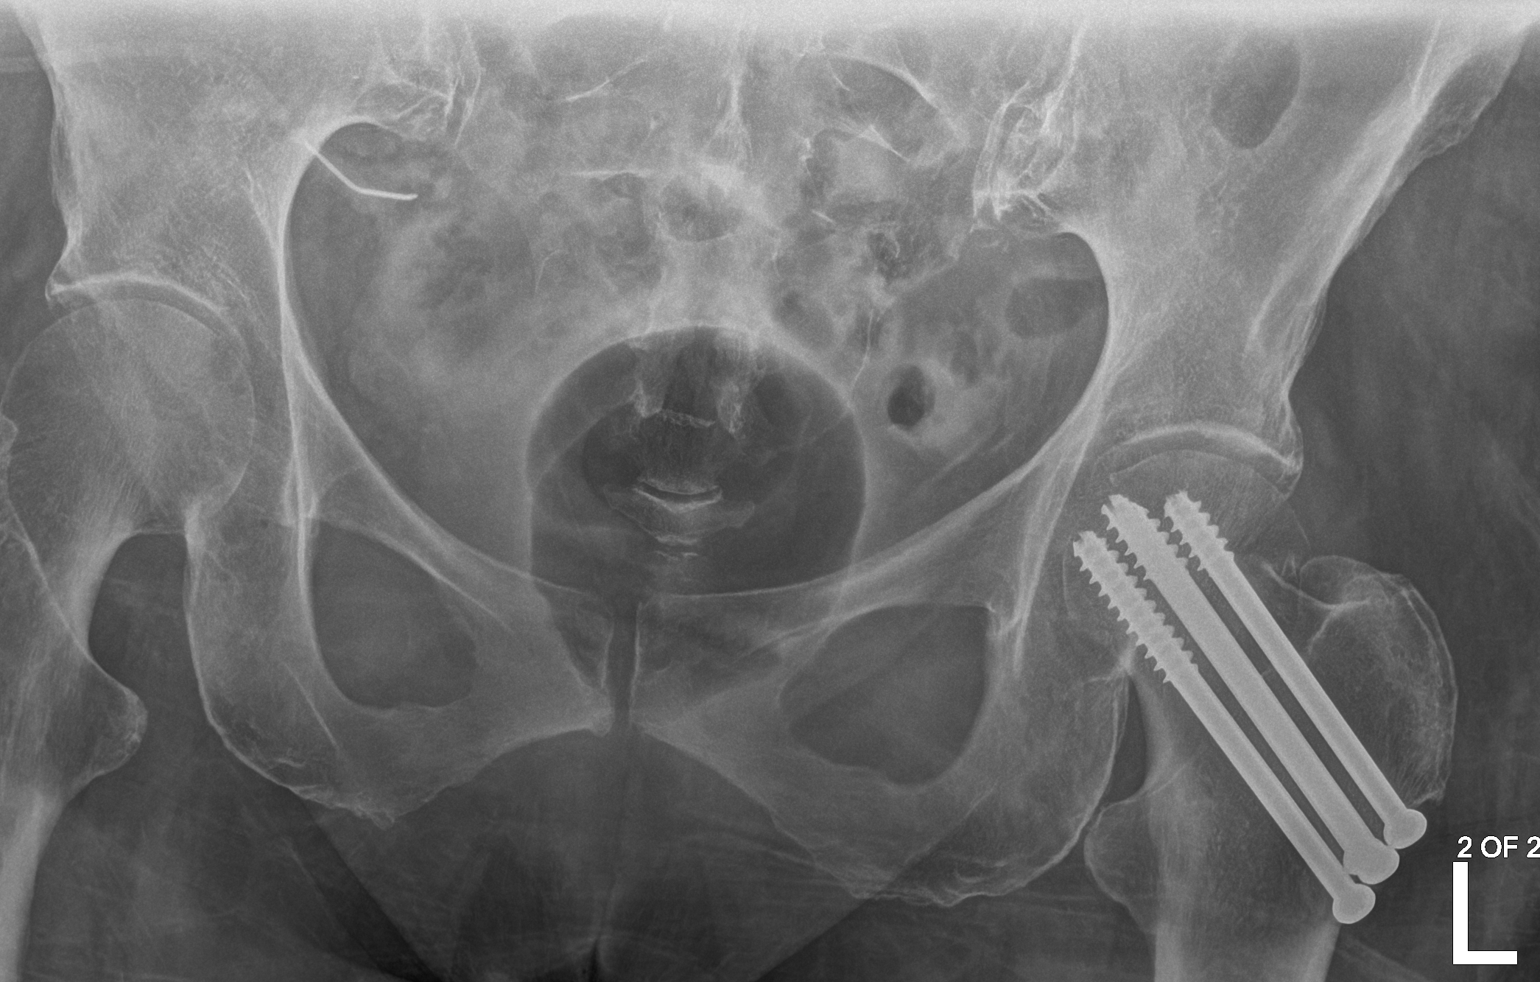

[2 of 2 positions shown; findings below may reference images not displayed]

FINDINGS: Bowel gas pattern is nonobstructive. Surgical clips are identified
in the RIGHT UPPER QUADRANT. There is no free intraperitoneal air.
Previous LEFT hip ORIF.
IMPRESSION: The nonobstructive bowel gas pattern.

## 2021-12-08 NOTE — Telephone Encounter (Signed)
Pt has called back asking for [place] and very upset as states the referral was sent to the wrong place and was denied. Pls fu at [phone]

## 2021-12-08 NOTE — Telephone Encounter (Signed)
Spoke with referral coordinator about referral for GI. She stated "I just spoke with [facility], they state that Dr Lacey Jensen is a GI Surgeon and he is not is the clinic. They did receive her referral and they will give her a call this week. I tried to schedule but they wouldn't let me. I tried calling [facility] but it patient didn't answer and she doesn't have a VM."  If patient returns call please give her this information. Thanks!

## 2021-12-08 NOTE — Telephone Encounter (Signed)
Spoke with referral coordinator. She said she will cal to schedule patients appt at [facility], and then call pt.  - Lezli Danek

## 2021-12-09 IMAGING — US US RENAL
1 series · 14 of 24 positions shown · non-contrast
Comparison: None.

CLINICAL DATA: UTI

EXAM:
RENAL / URINARY TRACT ULTRASOUND COMPLETE

[Series 1: us renal · 14 of 24 slices shown]
[im 1/24]
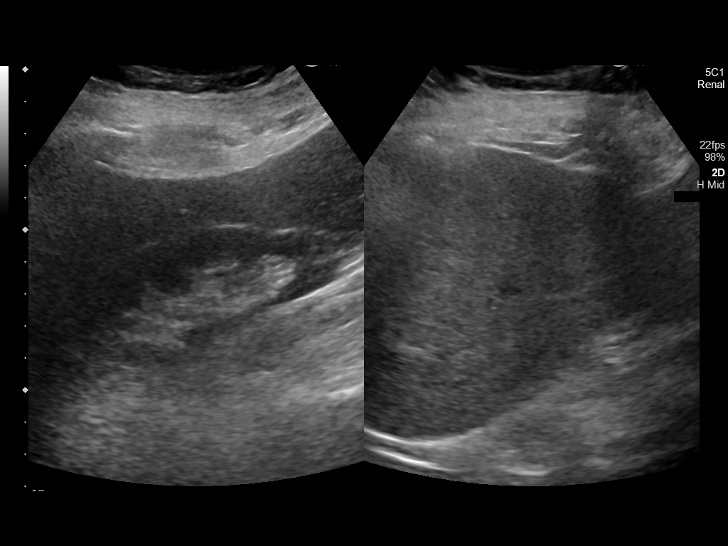
[im 3/24]
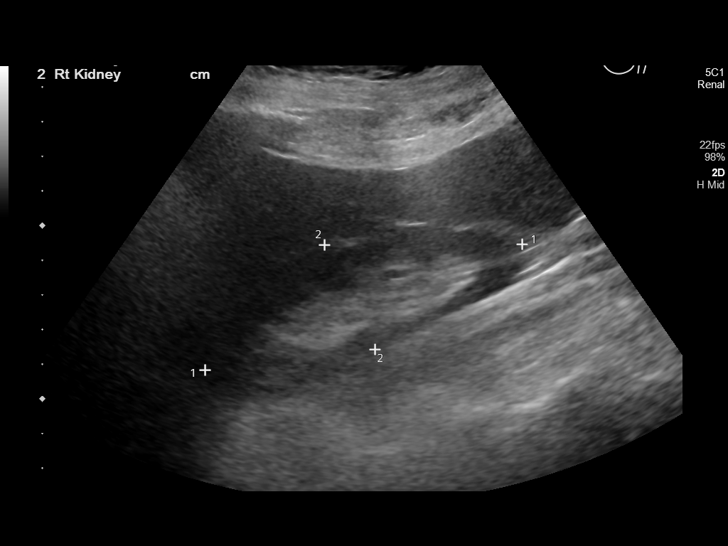
[im 5/24]
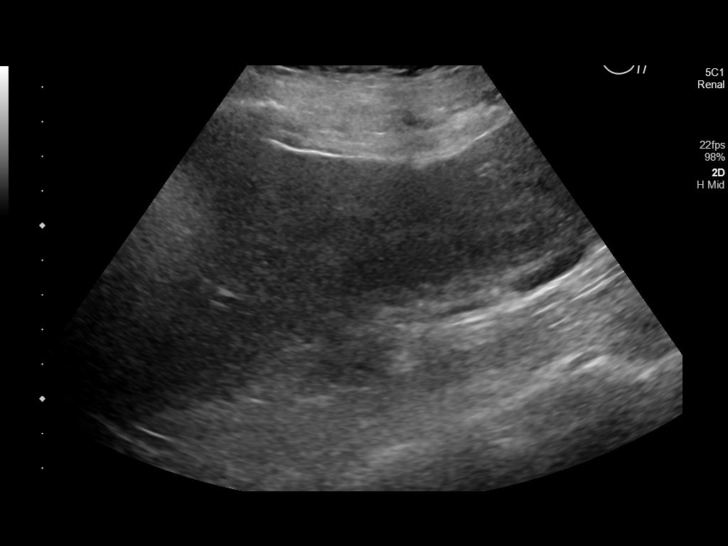
[im 7/24]
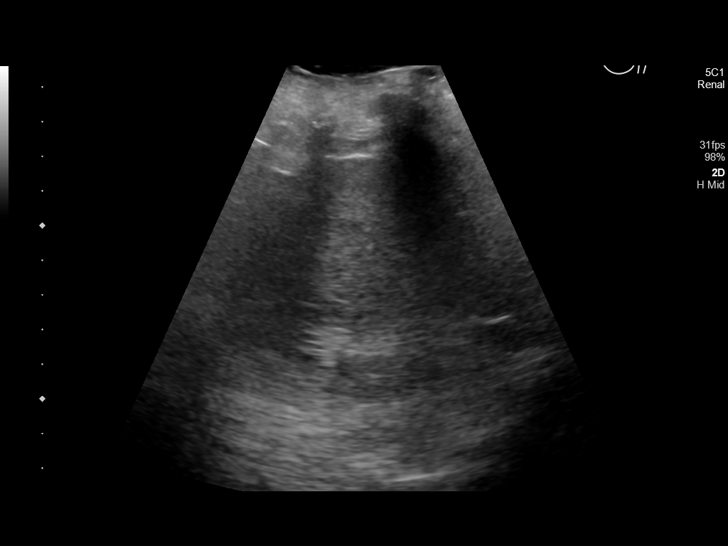
[im 8/24]
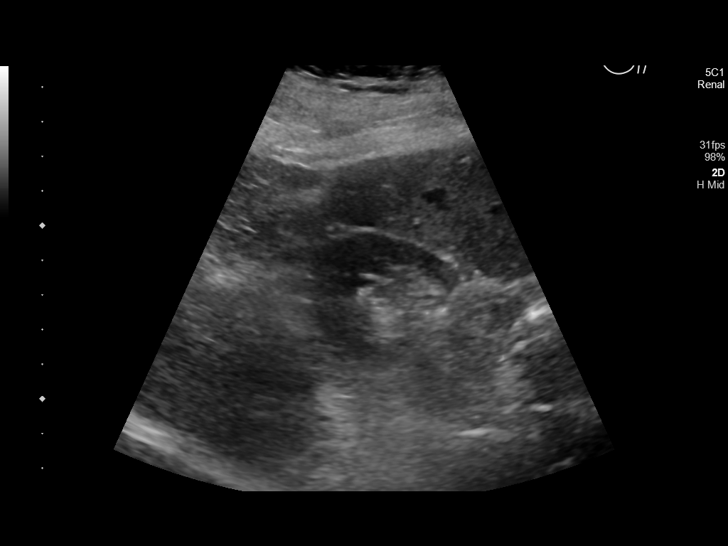
[im 10/24]
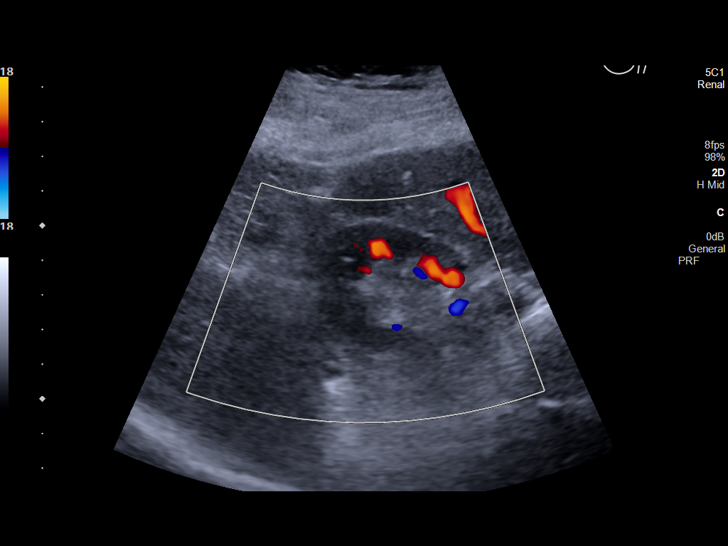
[im 12/24]
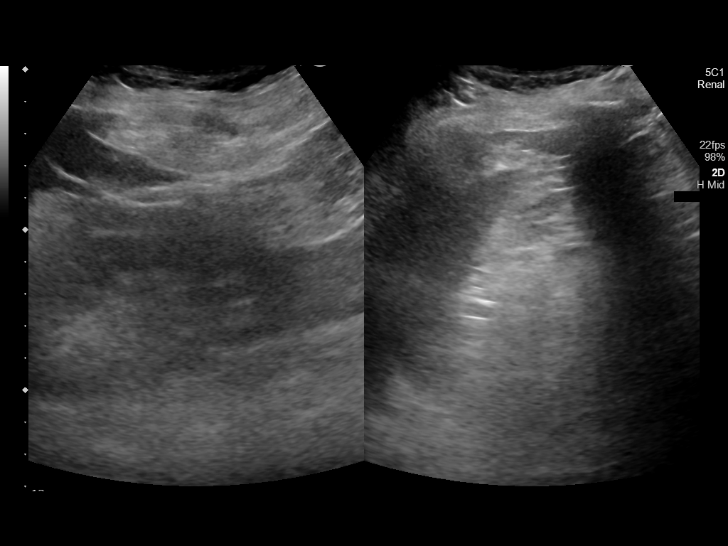
[im 13/24]
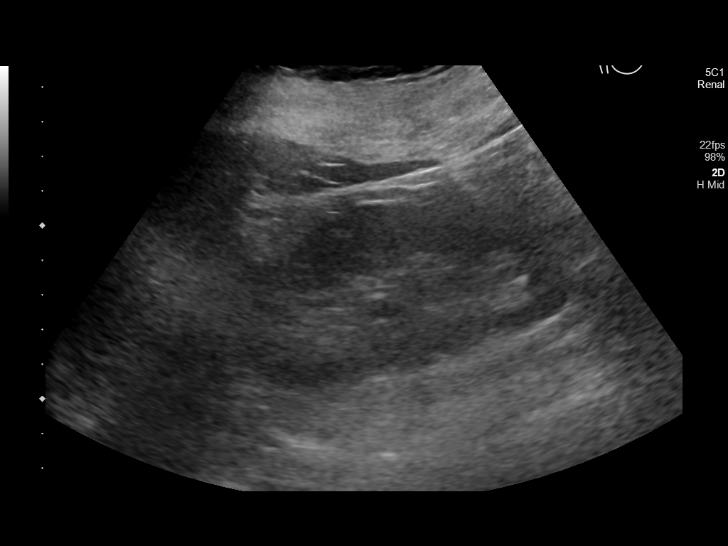
[im 15/24]
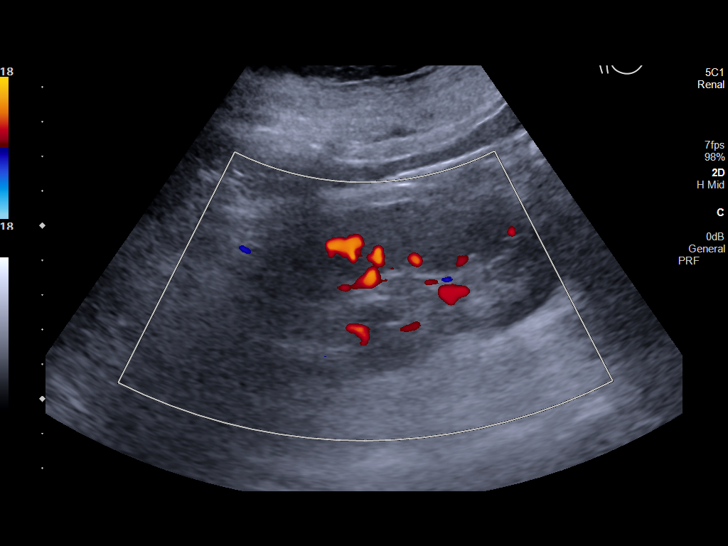
[im 17/24]
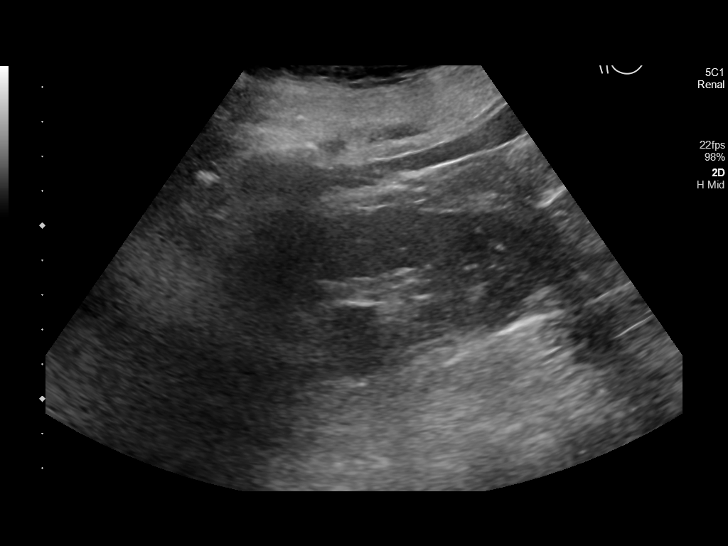
[im 19/24]
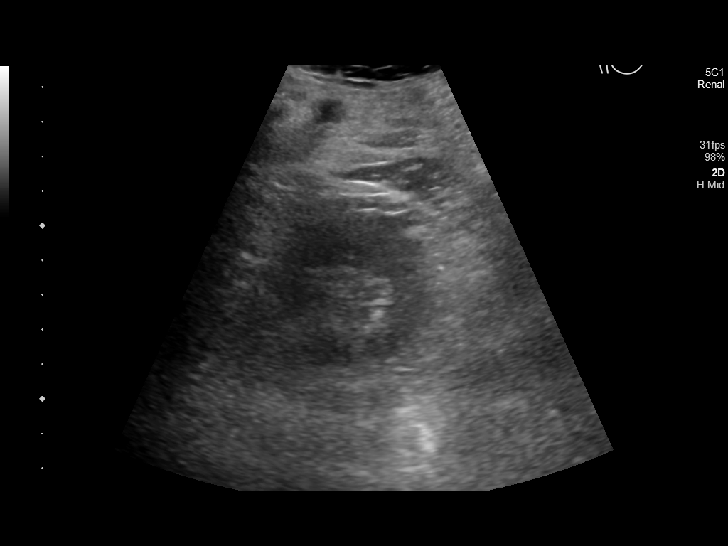
[im 20/24]
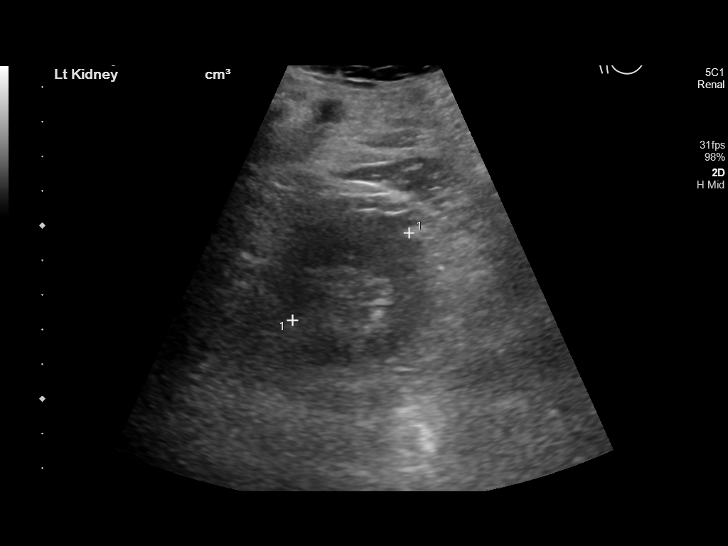
[im 22/24]
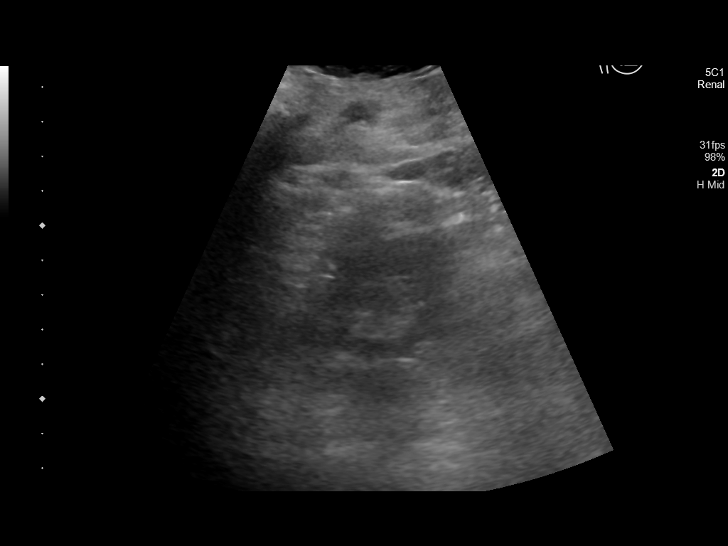
[im 24/24]
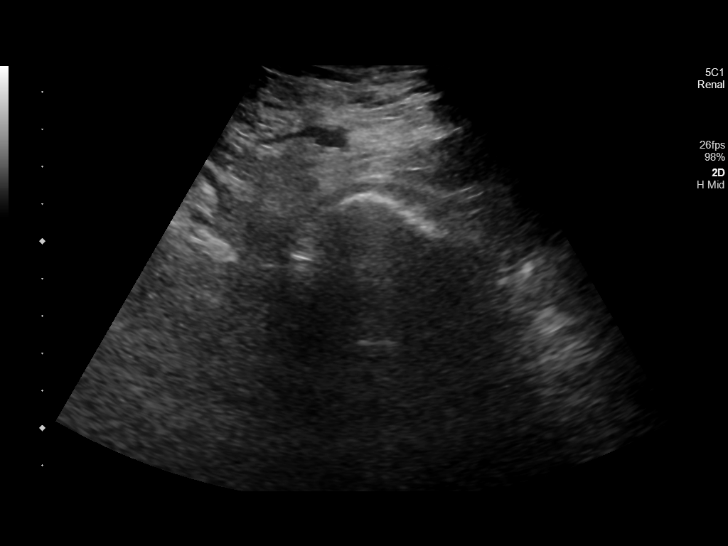

[14 of 24 positions shown; findings below may reference images not displayed]

FINDINGS: Right Kidney:

Renal measurements: 9.9 x 3.4 x 4.1 cm = volume: 71 mL. Echogenicity
within normal limits. No mass or hydronephrosis visualized.

Left Kidney:

Renal measurements: 9.8 x 5.1 x 4.2 cm = volume: 110 mL.
Echogenicity within normal limits. No mass or hydronephrosis
visualized.

Bladder:

Decompressed with Foley catheter in place.

Other:

None.
IMPRESSION: Unremarkable sonographic appearance of the bilateral kidneys.

## 2021-12-09 NOTE — Telephone Encounter (Addendum)
Pt daughter called in and stated she possibly needed to speak with a manager about a referral to GI. Advised daughter per telephone encounter for: 12/08/21. Just spoke with [facility], they state that Dr Lacey Jensen is a GI Surgeon, and he is not the clinic. They did receive her referral and they will give her a call this week. I tried to schedule but they wouldn't let me.  Daughter stated the referral went through yesterday. [facility] told pt husband they couldn't do anything until the referral was corrected. Daughter says the referral says it's going to [facility] and they cannot schedule pt until this is corrected.     I spoke with Judson Roch in referrals, and Judson Roch stated she would help me and reach out to [facility] to see what the error was.  Please advise.

## 2021-12-10 DIAGNOSIS — L4050 Arthropathic psoriasis, unspecified: Secondary | ICD-10-CM | POA: Diagnosis not present

## 2021-12-10 DIAGNOSIS — Z79899 Other long term (current) drug therapy: Secondary | ICD-10-CM | POA: Diagnosis not present

## 2021-12-10 DIAGNOSIS — R7309 Other abnormal glucose: Secondary | ICD-10-CM | POA: Diagnosis not present

## 2021-12-10 DIAGNOSIS — Z23 Encounter for immunization: Secondary | ICD-10-CM | POA: Diagnosis not present

## 2021-12-10 DIAGNOSIS — I480 Paroxysmal atrial fibrillation: Secondary | ICD-10-CM | POA: Diagnosis not present

## 2021-12-10 DIAGNOSIS — J479 Bronchiectasis, uncomplicated: Secondary | ICD-10-CM | POA: Diagnosis not present

## 2021-12-10 DIAGNOSIS — J449 Chronic obstructive pulmonary disease, unspecified: Secondary | ICD-10-CM | POA: Diagnosis not present

## 2021-12-10 DIAGNOSIS — E782 Mixed hyperlipidemia: Secondary | ICD-10-CM | POA: Diagnosis not present

## 2021-12-10 DIAGNOSIS — Z0000 Encounter for general adult medical examination without abnormal findings: Secondary | ICD-10-CM | POA: Diagnosis not present

## 2021-12-10 NOTE — Telephone Encounter (Signed)
Routing message to you per Dr Thomasenia Bottoms. We have done everything we can to get this patient into a GI doctor. Unfortunately, her referral was not submitted correctly the first time by Parke Poisson, so [name] had to re-enter it into the [facility] system of referrals. I spoke with patients husband and informed him of this, and that they should hear something from [facility] this week, or they can try to call them today to schedule an appointment, and this is the response we got. The daughter called wanting to speak to the manager. The patient actually has an appointment with a GI doctor in January but this is at [facility], not [facility]. She was seeing [facility] previously for this issue (chronic nausea.)  Thanks.  [name]

## 2021-12-21 DIAGNOSIS — R110 Nausea: Secondary | ICD-10-CM | POA: Diagnosis not present

## 2021-12-21 DIAGNOSIS — R630 Anorexia: Secondary | ICD-10-CM | POA: Diagnosis not present

## 2021-12-27 DIAGNOSIS — Z1231 Encounter for screening mammogram for malignant neoplasm of breast: Secondary | ICD-10-CM | POA: Diagnosis not present

## 2021-12-27 DIAGNOSIS — Z853 Personal history of malignant neoplasm of breast: Secondary | ICD-10-CM | POA: Diagnosis not present

## 2021-12-30 DIAGNOSIS — R110 Nausea: Secondary | ICD-10-CM | POA: Diagnosis not present

## 2021-12-30 DIAGNOSIS — I700 Atherosclerosis of aorta: Secondary | ICD-10-CM | POA: Diagnosis not present

## 2021-12-30 DIAGNOSIS — J479 Bronchiectasis, uncomplicated: Secondary | ICD-10-CM | POA: Diagnosis not present

## 2021-12-30 DIAGNOSIS — J449 Chronic obstructive pulmonary disease, unspecified: Secondary | ICD-10-CM | POA: Diagnosis not present

## 2021-12-30 DIAGNOSIS — M8588 Other specified disorders of bone density and structure, other site: Secondary | ICD-10-CM | POA: Diagnosis not present

## 2021-12-30 DIAGNOSIS — D6869 Other thrombophilia: Secondary | ICD-10-CM | POA: Diagnosis not present

## 2022-01-04 DIAGNOSIS — H2513 Age-related nuclear cataract, bilateral: Secondary | ICD-10-CM | POA: Diagnosis not present

## 2022-01-04 DIAGNOSIS — H401131 Primary open-angle glaucoma, bilateral, mild stage: Secondary | ICD-10-CM | POA: Diagnosis not present

## 2022-01-06 ENCOUNTER — Telehealth: Payer: Self-pay | Admitting: Pulmonary Disease

## 2022-02-01 DIAGNOSIS — Z85828 Personal history of other malignant neoplasm of skin: Secondary | ICD-10-CM | POA: Diagnosis not present

## 2022-02-01 DIAGNOSIS — L400 Psoriasis vulgaris: Secondary | ICD-10-CM | POA: Diagnosis not present

## 2022-02-01 DIAGNOSIS — Z08 Encounter for follow-up examination after completed treatment for malignant neoplasm: Secondary | ICD-10-CM | POA: Diagnosis not present

## 2022-02-01 DIAGNOSIS — Z79899 Other long term (current) drug therapy: Secondary | ICD-10-CM | POA: Diagnosis not present

## 2022-02-01 DIAGNOSIS — L570 Actinic keratosis: Secondary | ICD-10-CM | POA: Diagnosis not present

## 2022-02-04 DIAGNOSIS — R0602 Shortness of breath: Secondary | ICD-10-CM | POA: Diagnosis not present

## 2022-02-04 DIAGNOSIS — Z9981 Dependence on supplemental oxygen: Secondary | ICD-10-CM | POA: Diagnosis not present

## 2022-02-04 DIAGNOSIS — J479 Bronchiectasis, uncomplicated: Secondary | ICD-10-CM | POA: Diagnosis not present

## 2022-02-04 DIAGNOSIS — J449 Chronic obstructive pulmonary disease, unspecified: Secondary | ICD-10-CM | POA: Diagnosis not present

## 2022-02-07 DIAGNOSIS — J449 Chronic obstructive pulmonary disease, unspecified: Secondary | ICD-10-CM | POA: Diagnosis not present

## 2022-02-21 ENCOUNTER — Ambulatory Visit: Payer: Medicare PPO

## 2022-02-23 NOTE — Telephone Encounter (Signed)
Per patient's chart, it looks like she has established with the [facility] pulmonary clinic. Will close this encounter.

## 2022-03-01 ENCOUNTER — Other Ambulatory Visit: Payer: Self-pay | Admitting: Internal Medicine

## 2022-03-01 ENCOUNTER — Other Ambulatory Visit: Payer: Self-pay | Admitting: Nurse Practitioner

## 2022-03-01 NOTE — Telephone Encounter (Signed)
Prescription refill request for Xarelto received.  Indication:afib Last office visit:7/23 Weight:56.7 kg Age:88 Scr:1.0 CrCl:36.15  ml/min  Prescription refilled

## 2022-03-18 DIAGNOSIS — J449 Chronic obstructive pulmonary disease, unspecified: Secondary | ICD-10-CM | POA: Diagnosis not present

## 2022-03-18 DIAGNOSIS — J479 Bronchiectasis, uncomplicated: Secondary | ICD-10-CM | POA: Diagnosis not present

## 2022-03-18 DIAGNOSIS — Z9981 Dependence on supplemental oxygen: Secondary | ICD-10-CM | POA: Diagnosis not present

## 2022-03-18 DIAGNOSIS — R0609 Other forms of dyspnea: Secondary | ICD-10-CM | POA: Diagnosis not present

## 2022-04-10 DIAGNOSIS — J449 Chronic obstructive pulmonary disease, unspecified: Secondary | ICD-10-CM | POA: Diagnosis not present

## 2022-04-11 DIAGNOSIS — Z8781 Personal history of (healed) traumatic fracture: Secondary | ICD-10-CM | POA: Diagnosis not present

## 2022-04-11 DIAGNOSIS — Z9889 Other specified postprocedural states: Secondary | ICD-10-CM | POA: Diagnosis not present

## 2022-04-11 DIAGNOSIS — I700 Atherosclerosis of aorta: Secondary | ICD-10-CM | POA: Diagnosis not present

## 2022-04-11 DIAGNOSIS — M25552 Pain in left hip: Secondary | ICD-10-CM | POA: Diagnosis not present

## 2022-04-11 DIAGNOSIS — M1612 Unilateral primary osteoarthritis, left hip: Secondary | ICD-10-CM | POA: Diagnosis not present

## 2022-04-11 IMAGING — US US MESENTERIC ARTERIAL DOPPLER
1 series · 14 of 16 positions shown · non-contrast
Comparison: No prior duplex

CLINICAL DATA: 86-year-old female with abdominal pain and nausea

EXAM:
US MESENTERIC ARTERIAL DOPPLER

[Series 1: us mesenteric arteries · 14 of 55 slices shown]
[im 1/55]
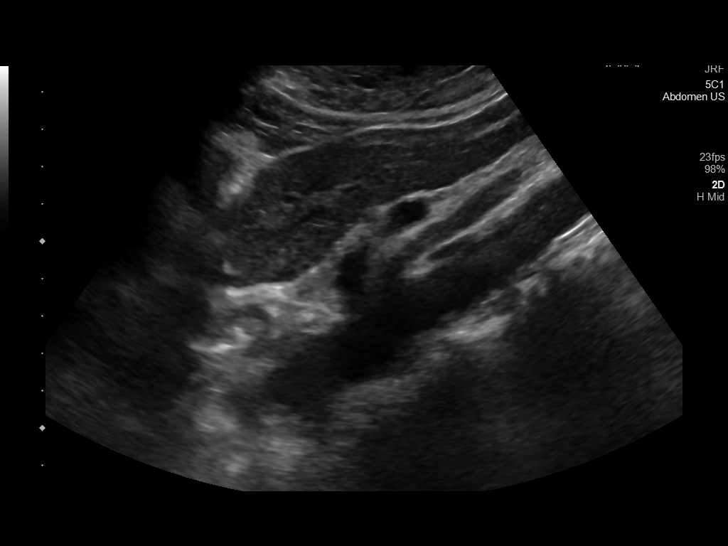
[im 4/55]
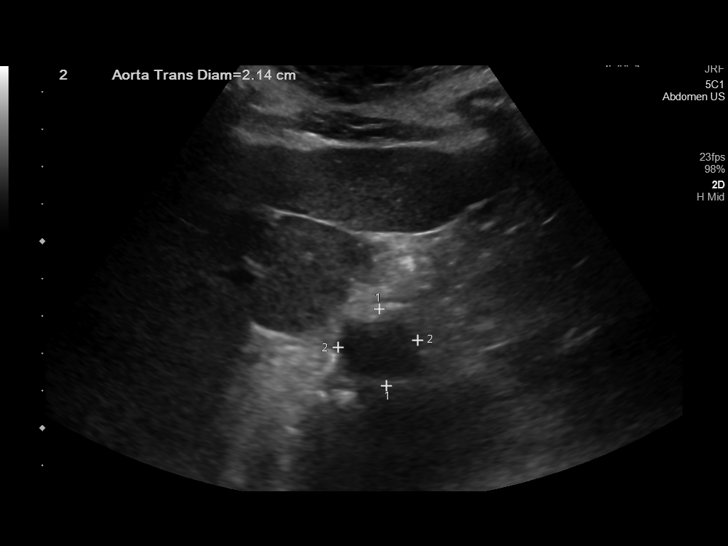
[im 8/55]
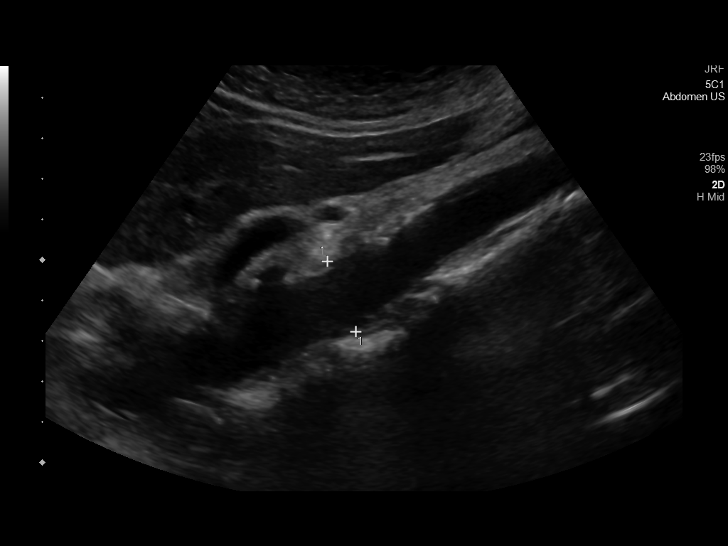
[im 15/55]
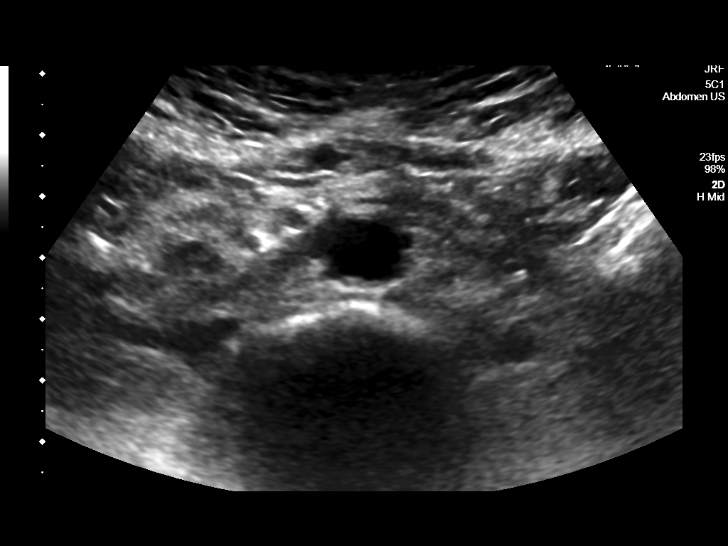
[im 19/55]
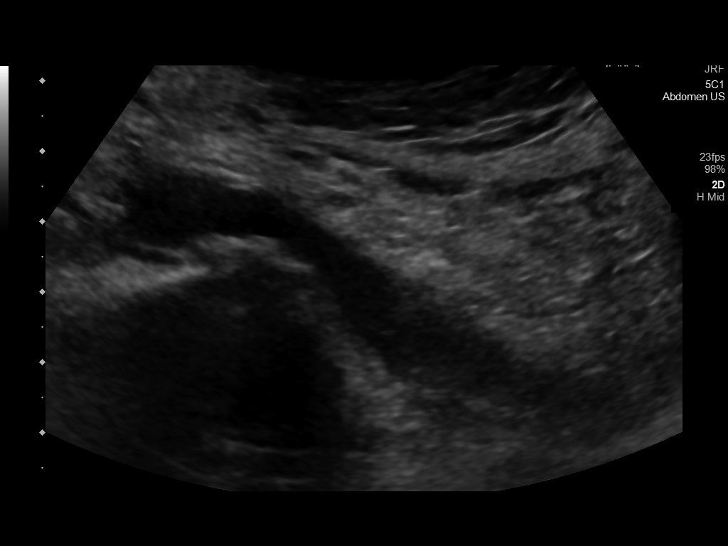
[im 22/55]
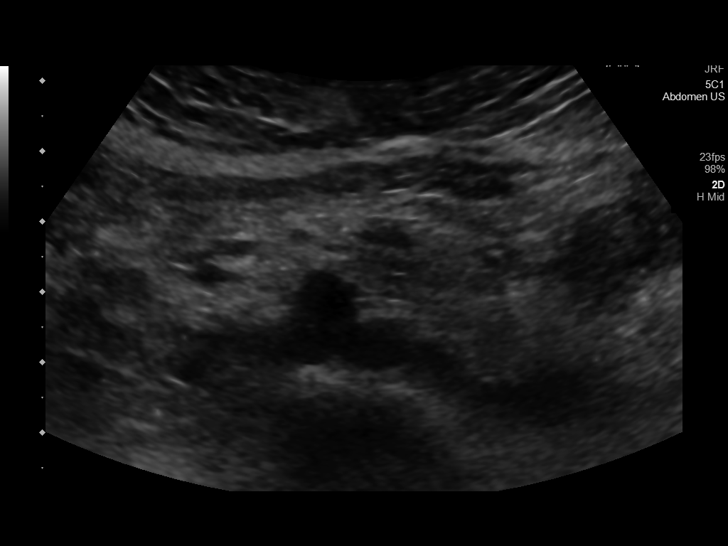
[im 26/55]
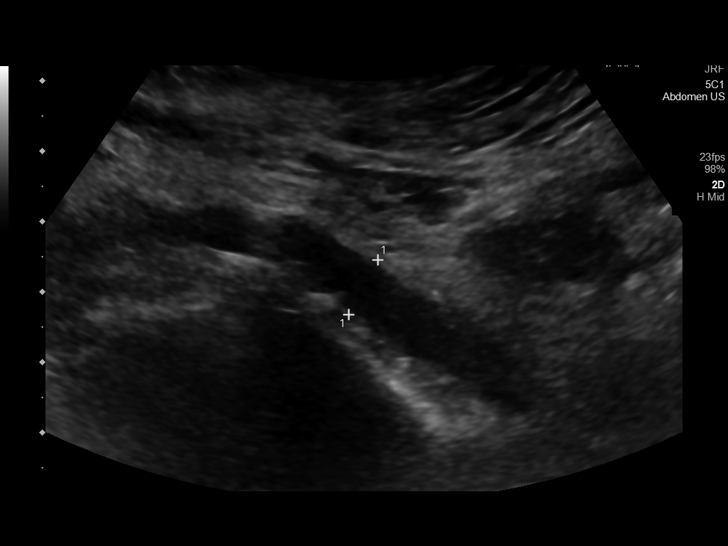
[im 29/55]
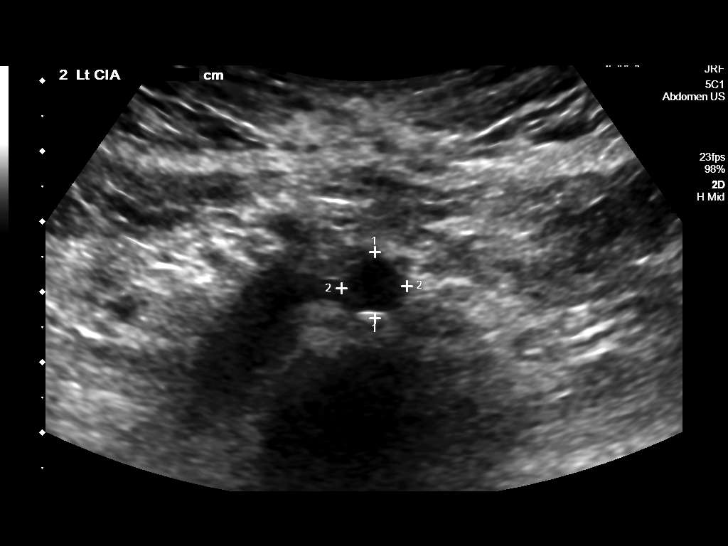
[im 33/55]
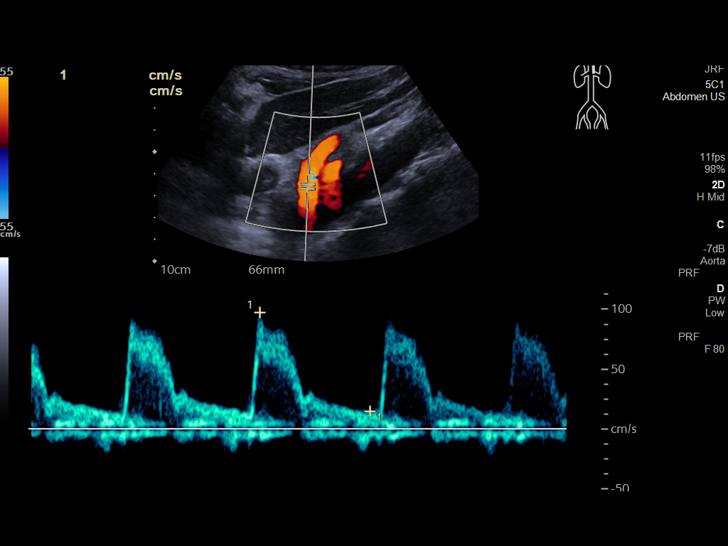
[im 37/55]
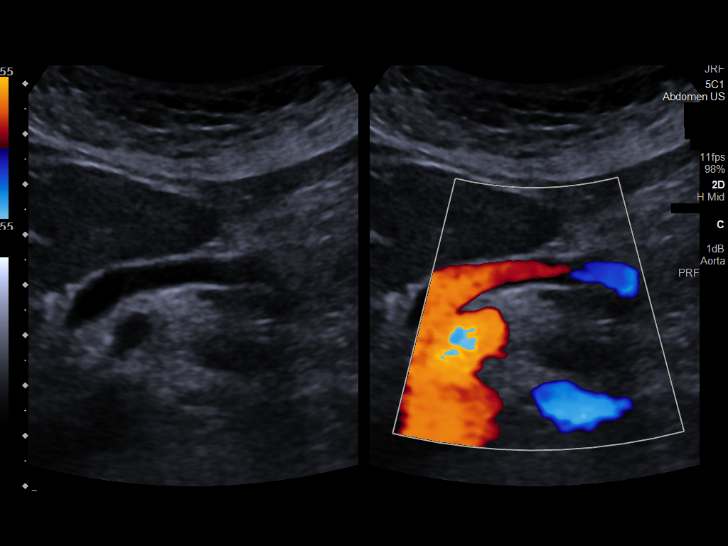
[im 44/55]
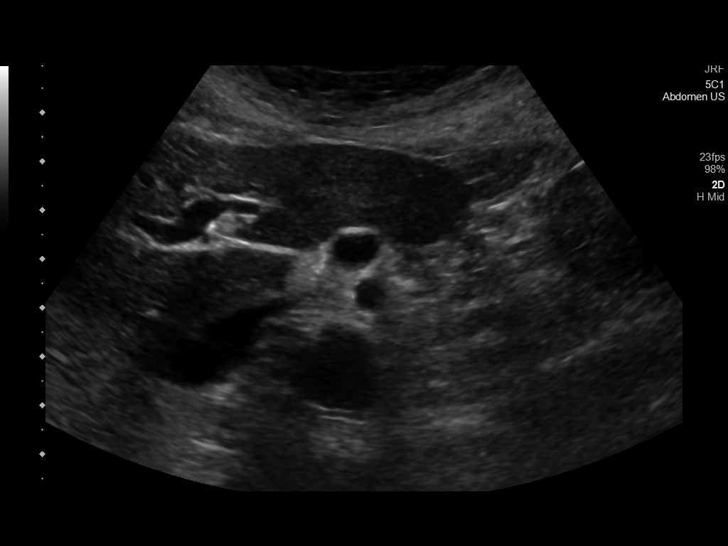
[im 47/55]
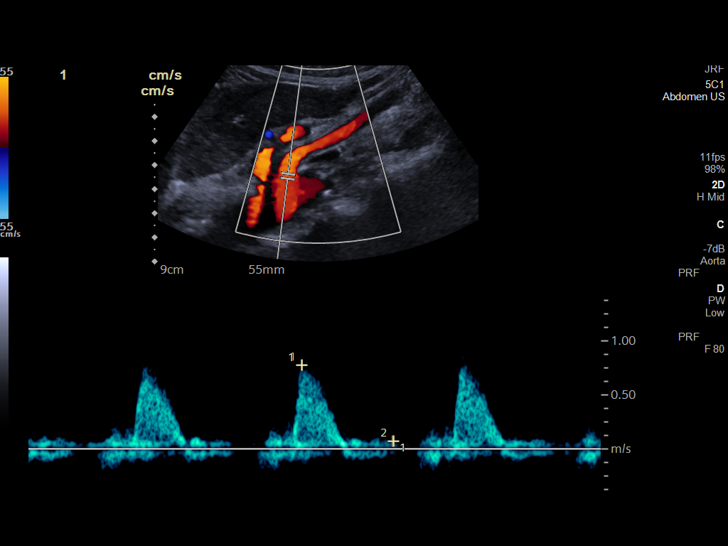
[im 51/55]
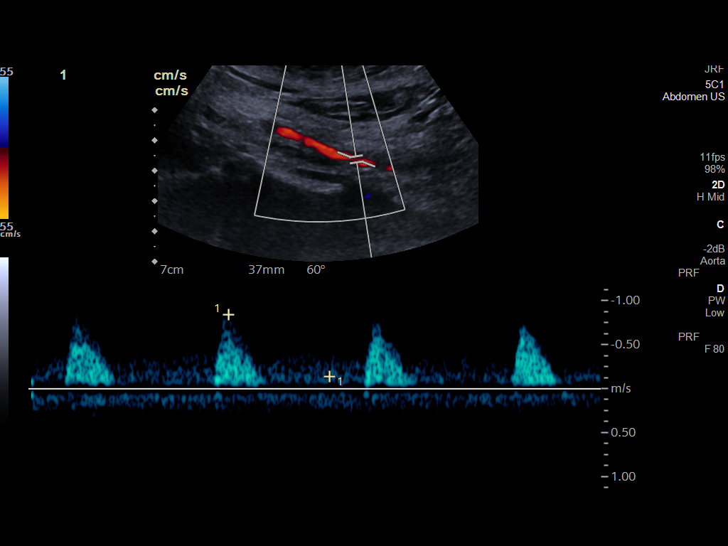
[im 55/55]
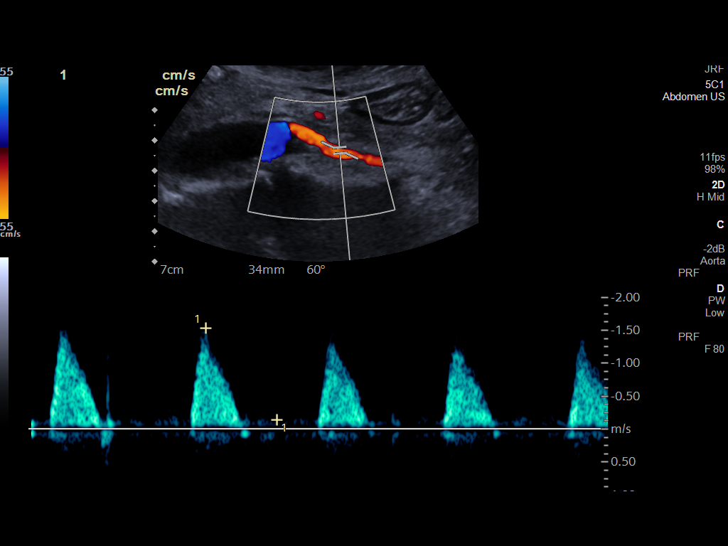

[14 of 16 positions shown; findings below may reference images not displayed]

FINDINGS: Celiac axis: 97 cm/sec

Celiac axis with inspiration: 110 cm/sec

Celiac axis with expiration: 107 cm/sec

Splenic artery: 97 cm/sec

Hepatic artery: 168 cm/sec

SMA: 209 cm/sec

Mid SMA: 189 centimeter/second

IMA: 153 cm/sec

Aorta: 98 cm/sec

Aortic size: 2.2 cm proximally, 2.0 cm in the mid segment and 1.4 cm
distally
IMPRESSION: Mesenteric duplex demonstrates no evidence of high-grade stenosis of
the mesenteric arteries.

## 2022-04-18 DIAGNOSIS — J479 Bronchiectasis, uncomplicated: Secondary | ICD-10-CM | POA: Diagnosis not present

## 2022-05-09 DIAGNOSIS — J449 Chronic obstructive pulmonary disease, unspecified: Secondary | ICD-10-CM | POA: Diagnosis not present

## 2022-05-17 ENCOUNTER — Encounter: Payer: Self-pay | Admitting: Pulmonary Disease

## 2022-05-17 DIAGNOSIS — J479 Bronchiectasis, uncomplicated: Secondary | ICD-10-CM | POA: Diagnosis not present

## 2022-05-24 ENCOUNTER — Other Ambulatory Visit: Payer: Self-pay | Admitting: Internal Medicine

## 2022-05-24 NOTE — Telephone Encounter (Signed)
Please contact pt for future appointment. Pt overdue for 6 month f/u. Pt needing refills. 

## 2022-05-24 NOTE — Telephone Encounter (Signed)
Patient states she will get get refills from PCP appt already scheduled

## 2022-05-25 ENCOUNTER — Telehealth: Payer: Self-pay | Admitting: Internal Medicine

## 2022-05-25 NOTE — Telephone Encounter (Signed)
Contacted [name] to schedule their annual wellness visit. Patient declined to schedule AWV at this time. Patient change pcp to [facility] per spouse  Sherol Dade; [facility] Direct Dial: [phone]

## 2022-06-09 DIAGNOSIS — J449 Chronic obstructive pulmonary disease, unspecified: Secondary | ICD-10-CM | POA: Diagnosis not present

## 2022-06-28 DIAGNOSIS — J449 Chronic obstructive pulmonary disease, unspecified: Secondary | ICD-10-CM | POA: Diagnosis not present

## 2022-06-28 DIAGNOSIS — J479 Bronchiectasis, uncomplicated: Secondary | ICD-10-CM | POA: Diagnosis not present

## 2022-06-28 DIAGNOSIS — R0602 Shortness of breath: Secondary | ICD-10-CM | POA: Diagnosis not present

## 2022-06-28 DIAGNOSIS — Z9981 Dependence on supplemental oxygen: Secondary | ICD-10-CM | POA: Diagnosis not present

## 2022-06-29 DIAGNOSIS — J9610 Chronic respiratory failure, unspecified whether with hypoxia or hypercapnia: Secondary | ICD-10-CM | POA: Diagnosis not present

## 2022-06-29 DIAGNOSIS — R7309 Other abnormal glucose: Secondary | ICD-10-CM | POA: Diagnosis not present

## 2022-06-29 DIAGNOSIS — Z8679 Personal history of other diseases of the circulatory system: Secondary | ICD-10-CM | POA: Diagnosis not present

## 2022-06-29 DIAGNOSIS — J449 Chronic obstructive pulmonary disease, unspecified: Secondary | ICD-10-CM | POA: Diagnosis not present

## 2022-06-29 DIAGNOSIS — J9611 Chronic respiratory failure with hypoxia: Secondary | ICD-10-CM | POA: Diagnosis present

## 2022-06-29 DIAGNOSIS — M1990 Unspecified osteoarthritis, unspecified site: Secondary | ICD-10-CM | POA: Diagnosis not present

## 2022-06-29 DIAGNOSIS — E782 Mixed hyperlipidemia: Secondary | ICD-10-CM | POA: Diagnosis not present

## 2022-07-07 DIAGNOSIS — H2513 Age-related nuclear cataract, bilateral: Secondary | ICD-10-CM | POA: Diagnosis not present

## 2022-07-07 DIAGNOSIS — H401131 Primary open-angle glaucoma, bilateral, mild stage: Secondary | ICD-10-CM | POA: Diagnosis not present

## 2022-07-09 DIAGNOSIS — J449 Chronic obstructive pulmonary disease, unspecified: Secondary | ICD-10-CM | POA: Diagnosis not present

## 2022-08-09 DIAGNOSIS — J449 Chronic obstructive pulmonary disease, unspecified: Secondary | ICD-10-CM | POA: Diagnosis not present

## 2022-08-16 DIAGNOSIS — Z79899 Other long term (current) drug therapy: Secondary | ICD-10-CM | POA: Diagnosis not present

## 2022-08-16 DIAGNOSIS — Z08 Encounter for follow-up examination after completed treatment for malignant neoplasm: Secondary | ICD-10-CM | POA: Diagnosis not present

## 2022-08-16 DIAGNOSIS — Z85828 Personal history of other malignant neoplasm of skin: Secondary | ICD-10-CM | POA: Diagnosis not present

## 2022-08-16 DIAGNOSIS — L400 Psoriasis vulgaris: Secondary | ICD-10-CM | POA: Diagnosis not present

## 2022-08-16 DIAGNOSIS — L570 Actinic keratosis: Secondary | ICD-10-CM | POA: Diagnosis not present

## 2022-08-29 DIAGNOSIS — J479 Bronchiectasis, uncomplicated: Secondary | ICD-10-CM | POA: Diagnosis not present

## 2022-08-29 DIAGNOSIS — J449 Chronic obstructive pulmonary disease, unspecified: Secondary | ICD-10-CM | POA: Diagnosis not present

## 2023-02-06 ENCOUNTER — Encounter: Payer: Self-pay | Admitting: Ophthalmology

## 2023-02-09 NOTE — Discharge Instructions (Signed)

## 2023-02-13 ENCOUNTER — Other Ambulatory Visit: Payer: Self-pay

## 2023-02-13 ENCOUNTER — Encounter: Payer: Self-pay | Admitting: Ophthalmology

## 2023-02-13 ENCOUNTER — Ambulatory Visit: Payer: Medicare PPO | Admitting: Anesthesiology

## 2023-02-13 ENCOUNTER — Encounter
Admission: RE | Disposition: A | Discharge status: Home / Self Care | Payer: Self-pay | Source: Home / Self Care | Attending: Ophthalmology

## 2023-02-13 ENCOUNTER — Ambulatory Visit
Admission: RE | Admit: 2023-02-13 | Discharge: 2023-02-13 | Disposition: A | Discharge status: Home / Self Care | Payer: Medicare PPO | Attending: Ophthalmology | Admitting: Ophthalmology

## 2023-02-13 DIAGNOSIS — H2512 Age-related nuclear cataract, left eye: Secondary | ICD-10-CM | POA: Diagnosis present

## 2023-02-13 DIAGNOSIS — L4050 Arthropathic psoriasis, unspecified: Secondary | ICD-10-CM | POA: Insufficient documentation

## 2023-02-13 DIAGNOSIS — Z7982 Long term (current) use of aspirin: Secondary | ICD-10-CM | POA: Diagnosis not present

## 2023-02-13 DIAGNOSIS — J449 Chronic obstructive pulmonary disease, unspecified: Secondary | ICD-10-CM | POA: Diagnosis not present

## 2023-02-13 DIAGNOSIS — Z79899 Other long term (current) drug therapy: Secondary | ICD-10-CM | POA: Insufficient documentation

## 2023-02-13 DIAGNOSIS — K219 Gastro-esophageal reflux disease without esophagitis: Secondary | ICD-10-CM | POA: Insufficient documentation

## 2023-02-13 DIAGNOSIS — I517 Cardiomegaly: Secondary | ICD-10-CM | POA: Diagnosis not present

## 2023-02-13 DIAGNOSIS — M1990 Unspecified osteoarthritis, unspecified site: Secondary | ICD-10-CM | POA: Diagnosis not present

## 2023-02-13 DIAGNOSIS — Z853 Personal history of malignant neoplasm of breast: Secondary | ICD-10-CM | POA: Insufficient documentation

## 2023-02-13 DIAGNOSIS — I10 Essential (primary) hypertension: Secondary | ICD-10-CM | POA: Insufficient documentation

## 2023-02-13 DIAGNOSIS — E785 Hyperlipidemia, unspecified: Secondary | ICD-10-CM | POA: Diagnosis not present

## 2023-02-13 DIAGNOSIS — I071 Rheumatic tricuspid insufficiency: Secondary | ICD-10-CM | POA: Diagnosis not present

## 2023-02-13 DIAGNOSIS — H401132 Primary open-angle glaucoma, bilateral, moderate stage: Secondary | ICD-10-CM | POA: Diagnosis not present

## 2023-02-13 HISTORY — DX: Atherosclerosis of aorta: I70.0

## 2023-02-13 HISTORY — DX: Other ill-defined heart diseases: I51.89

## 2023-02-13 HISTORY — DX: Other thrombophilia: D68.69

## 2023-02-13 HISTORY — DX: Pulmonary hypertension, unspecified: I27.20

## 2023-02-13 HISTORY — PX: CATARACT EXTRACTION W/PHACO: SHX586

## 2023-02-13 HISTORY — DX: Cardiomegaly: I51.7

## 2023-02-13 HISTORY — DX: Chronic obstructive pulmonary disease, unspecified: J44.9

## 2023-02-13 HISTORY — DX: Arthropathic psoriasis, unspecified: L40.50

## 2023-02-13 HISTORY — DX: Unspecified atrial fibrillation: I48.91

## 2023-02-13 HISTORY — DX: Rheumatic mitral stenosis: I05.0

## 2023-02-13 HISTORY — DX: Nausea: R11.0

## 2023-02-13 SURGERY — PHACOEMULSIFICATION, CATARACT, WITH IOL INSERTION
Anesthesia: Monitor Anesthesia Care | Site: Eye | Laterality: Left

## 2023-02-13 MED ORDER — TETRACAINE HCL 0.5 % OP SOLN
1.0000 [drp] | OPHTHALMIC | Status: DC | PRN
  Administered 2023-02-13 (×3): 1 [drp] via OPHTHALMIC

## 2023-02-13 MED ORDER — SIGHTPATH DOSE#1 NA HYALUR & NA CHOND-NA HYALUR IO KIT
PACK | INTRAOCULAR | Status: DC | PRN
  Administered 2023-02-13: 1 via OPHTHALMIC

## 2023-02-13 MED ORDER — FENTANYL CITRATE (PF) 100 MCG/2ML IJ SOLN
INTRAMUSCULAR | Status: AC
  Filled 2023-02-13: qty 2

## 2023-02-13 MED ORDER — TETRACAINE HCL 0.5 % OP SOLN
OPHTHALMIC | Status: AC
Start: 2023-02-13 — End: ?
  Filled 2023-02-13: qty 4

## 2023-02-13 MED ORDER — BRIMONIDINE TARTRATE-TIMOLOL 0.2-0.5 % OP SOLN
OPHTHALMIC | Status: DC | PRN
  Administered 2023-02-13: 1 [drp] via OPHTHALMIC

## 2023-02-13 MED ORDER — MIDAZOLAM HCL 2 MG/2ML IJ SOLN
INTRAMUSCULAR | Status: AC
  Filled 2023-02-13: qty 2

## 2023-02-13 MED ORDER — SIGHTPATH DOSE#1 BSS IO SOLN
INTRAOCULAR | Status: DC | PRN
  Administered 2023-02-13: 15 mL

## 2023-02-13 MED ORDER — SIGHTPATH DOSE#1 BSS IO SOLN
INTRAOCULAR | Status: DC | PRN
  Administered 2023-02-13: 63 mL via OPHTHALMIC

## 2023-02-13 MED ORDER — MOXIFLOXACIN HCL 0.5 % OP SOLN
OPHTHALMIC | Status: DC | PRN
  Administered 2023-02-13: .2 mL via OPHTHALMIC

## 2023-02-13 MED ORDER — LIDOCAINE HCL (PF) 2 % IJ SOLN
INTRAOCULAR | Status: DC | PRN
  Administered 2023-02-13: 1 mL via INTRAOCULAR

## 2023-02-13 MED ORDER — ARMC OPHTHALMIC DILATING DROPS
1.0000 | OPHTHALMIC | Status: DC | PRN
  Administered 2023-02-13 (×3): 1 via OPHTHALMIC

## 2023-02-13 MED ORDER — MIDAZOLAM HCL 2 MG/2ML IJ SOLN
INTRAMUSCULAR | Status: DC | PRN
  Administered 2023-02-13: 2 mg via INTRAVENOUS

## 2023-02-13 SURGICAL SUPPLY — 22 items
BLADE DUAL KAHOOK SINGLE USE (BLADE) IMPLANT
BNDG EYE OVAL 2 1/8 X 2 5/8 (GAUZE/BANDAGES/DRESSINGS) IMPLANT
CANNULA ANT/CHMB 27G (MISCELLANEOUS) IMPLANT
CANNULA ANT/CHMB 27GA (MISCELLANEOUS)
CATARACT SUITE SIGHTPATH (MISCELLANEOUS) ×1
DISSECTOR HYDRO NUCLEUS 50X22 (MISCELLANEOUS) ×1 IMPLANT
DRSG TEGADERM 2-3/8X2-3/4 SM (GAUZE/BANDAGES/DRESSINGS) ×1 IMPLANT
FEE CATARACT SUITE SIGHTPATH (MISCELLANEOUS) ×1 IMPLANT
GLOVE SURG SYN 7.5 E (GLOVE) ×1
GLOVE SURG SYN 7.5 PF PI (GLOVE) ×1 IMPLANT
GLOVE SURG SYN 8.5 E (GLOVE) ×1
GLOVE SURG SYN 8.5 PF PI (GLOVE) ×1 IMPLANT
LENS CLAREON WAGON WHEEL 23.0 (Intraocular Lens) ×1 IMPLANT
LENS IOL CLRN WGN WHL 23.0 (Intraocular Lens) IMPLANT
NDL FILTER BLUNT 18X1 1/2 (NEEDLE) IMPLANT
NEEDLE FILTER BLUNT 18X1 1/2 (NEEDLE)
PACK VIT ANT 23G (MISCELLANEOUS) IMPLANT
RING MALYGIN 7.0 (MISCELLANEOUS) IMPLANT
SUT ETHILON 10-0 CS-B-6CS-B-6 (SUTURE)
SUTURE EHLN 10-0 CS-B-6CS-B-6 (SUTURE) IMPLANT
SYR 3ML LL SCALE MARK (SYRINGE) IMPLANT
SYR 5ML LL (SYRINGE) IMPLANT

## 2023-02-13 NOTE — Op Note (Signed)
OPERATIVE NOTE  SEHEJ SYPNIEWSKI [id number] 02/13/2023   PREOPERATIVE DIAGNOSIS: Nuclear sclerotic cataract left eye. H25.12  Moderate stage Primary Open Angle Glaucoma left eye H40.1122   POSTOPERATIVE DIAGNOSIS: Nuclear sclerotic cataract left eye. H25.12  Moderate stage Primary Open Angle Glaucoma right eye H40.1122   PROCEDURE:  Phacoemusification with posterior chamber intraocular lens placement of the left eye  [place] Dual Blade goniotomy left eye Ultrasound time: Procedure(s) with comments: CATARACT EXTRACTION PHACO AND INTRAOCULAR LENS PLACEMENT (IOC) LEFT  KDB GONIOTOMY (Left) - 10.53 0:55.  LENS:   Implant Name Type Inv. Item Serial No. Manufacturer Lot No. LRB No. Used Action  LENS CLAREON WAGON WHEEL 23.0 - [id number] Intraocular Lens LENS CLAREON WAGON WHEEL 23.0 [id number] [employer]  Left 1 Implanted      SURGEON:  Julious Payer. Rolley Sims, MD   ANESTHESIA:  Topical with tetracaine drops, augmented with 1% preservative-free intracameral lidocaine.   COMPLICATIONS:  None.   DESCRIPTION OF PROCEDURE:  The patient was identified in the holding room and transported to the operating room and placed in the supine position under the operating microscope.  The left eye was identified as the operative eye, which was prepped and draped in the usual sterile ophthalmic fashion.   A 1 millimeter clear-corneal paracentesis was made inferotemporally. Preservative-free 1% lidocaine mixed with 1:1,000 bisulfite-free aqueous solution of epinephrine was injected into the anterior chamber. The anterior chamber was then filled with Viscoat viscoelastic. A 2.4 millimeter keratome was used to make a clear-corneal incision superotemporally. A curvilinear capsulorrhexis was made with a cystotome and capsulorrhexis forceps.   The microscope was adjusted and a gonioprism was used to visualize the trabecular meshwork.  The [facility] Dual Blade was advanced across the anterior chamber under viscoelastic.  The  blade was used to mark the trabecular meshwork at the 7:30 position.  The blade was placed two clock hours clockwise into the meshwork.  Proper positioning was confirmed.  The blade was passed counterclockwise through the meshwork to excise approximately two to three clock-hours of trabecular meshwork. The microscope was adjusted back to its original position.  Balanced salt solution was used to hydrodissect and hydrodelineate the nucleus. Phacoemulsification was then used to remove the lens nucleus and epinucleus. The remaining cortex was then removed using the irrigation and aspiration handpiece. Provisc was then placed into the capsular bag to distend it for lens placement. A +23.00 D [id number] intraocular lens was then injected into the capsular bag. The remaining viscoelastic was aspirated.   Wounds were hydrated with balanced salt solution.  The anterior chamber was inflated to a physiologic pressure with balanced salt solution.  No wound leaks were noted. Vigamox was injected intracamerally.  Timolol and Brimonidine drops were applied to the eye.  The patient was taken to the recovery room in stable condition without complications of anesthesia or surgery.  Rolly Pancake [place] 02/13/2023, 1:06 PM

## 2023-02-13 NOTE — Transfer of Care (Signed)
Immediate Anesthesia Transfer of Care Note  Patient: [name]  Procedure(s) Performed: CATARACT EXTRACTION PHACO AND INTRAOCULAR LENS PLACEMENT (IOC) LEFT  KDB GONIOTOMY (Left: Eye)  Patient Location: PACU  Anesthesia Type: MAC  Level of Consciousness: awake, alert  and patient cooperative  Airway and Oxygen Therapy: Patient Spontanous Breathing and Patient connected to supplemental oxygen  Post-op Assessment: Post-op Vital signs reviewed, Patient's Cardiovascular Status Stable, Respiratory Function Stable, Patent Airway and No signs of Nausea or vomiting  Post-op Vital Signs: Reviewed and stable  Complications: No notable events documented.

## 2023-02-13 NOTE — H&P (Signed)
[facility]   Primary Care Physician:  Danella Penton, MD Ophthalmologist: Dr. Deberah Pelton  Pre-Procedure History & Physical: HPI:  [name] is a 89 y.o. female here for cataract surgery.   Past Medical History:  Diagnosis Date   Acute metabolic encephalopathy 06/19/2020   Allergy    Bronchiectasis (HCC)    Cancer (HCC)    left breast cancer    COPD (chronic obstructive pulmonary disease) (HCC)    GERD (gastroesophageal reflux disease)    Hyperlipemia    Hypertension    Intractable nausea and vomiting 06/20/2018   Psoriatic arthritis [facility])     Past Surgical History:  Procedure Laterality Date   BREAST LUMPECTOMY Left 2008   CHOLECYSTECTOMY     HIP PINNING,CANNULATED Left 06/05/2020   Procedure: CANNULATED HIP PINNING;  Surgeon: Kennedy Bucker, MD;  Location: [facility] ORS;  Service: Orthopedics;  Laterality: Left;   PARTIAL HYSTERECTOMY     TEE WITHOUT CARDIOVERSION N/A 06/10/2020   Procedure: TRANSESOPHAGEAL ECHOCARDIOGRAM (TEE);  Surgeon: Debbe Odea, MD;  Location: [facility] ORS;  Service: Cardiovascular;  Laterality: N/A;    Prior to Admission medications   Medication Sig Start Date End Date Taking? Authorizing Provider  ASPIRIN 81 PO Take by mouth daily.   Yes [provider]  Fluticasone-Umeclidin-Vilant (TRELEGY ELLIPTA) 100-62.5-25 MCG/ACT AEPB Inhale into the lungs daily.   Yes [provider]  magnesium oxide (MAG-OX) 400 (240 Mg) MG tablet Take 400 mg by mouth daily.   Yes [provider]  omeprazole (PRILOSEC) 40 MG capsule Take 40 mg by mouth daily.   Yes [provider]  atorvastatin (LIPITOR) 20 MG tablet TAKE 1 TABLET(20 MG) BY MOUTH DAILY Patient not taking: Reported on 02/06/2023 10/07/21   Reubin Milan, MD  azelastine (ASTELIN) 0.1 % nasal spray Place 1 spray into both nostrils 2 (two) times daily. Use in each nostril as directed Patient not taking: Reported on 02/06/2023 11/05/21   Reubin Milan, MD   cetirizine (ZYRTEC) 10 MG tablet Take 1 tablet (10 mg total) by mouth daily. 06/11/20   Tresa Moore, MD  famotidine (PEPCID) 10 MG tablet Take 10 mg by mouth daily. Patient not taking: Reported on 02/06/2023    [provider]  fluconazole (DIFLUCAN) 150 MG tablet Take 1 tablet (150 mg total) by mouth daily. -For your yeast infection, start the Diflucan (fluconazole)- Take one pill today (day 1). If you're still having symptoms in 3 days, take the second pill. 07/24/21   Rhys Martini, PA-C  fluticasone (FLONASE) 50 MCG/ACT nasal spray SHAKE LIQUID AND USE 2 SPRAYS IN EACH NOSTRIL DAILY AS NEEDED FOR ALLERGIES OR RHINITIS Patient not taking: Reported on 02/06/2023 01/14/21   Reubin Milan, MD  folic acid (FOLVITE) 1 MG tablet Take 1 mg by mouth daily. 07/13/21   [provider]  hydrochlorothiazide (MICROZIDE) 12.5 MG capsule TAKE 1 CAPSULE(12.5 MG) BY MOUTH DAILY 03/01/22   End, Cristal Deer, MD  methotrexate (RHEUMATREX) 2.5 MG tablet Take 2.5 mg by mouth once a week. Caution:Chemotherapy. Protect from light.    [provider]  metoprolol tartrate (LOPRESSOR) 25 MG tablet Take 0.5 tablets (12.5 mg total) by mouth 2 (two) times daily. 10/11/21   Furth, Cadence H, PA-C  ondansetron (ZOFRAN) 4 MG tablet Take 1 tablet (4 mg total) by mouth every 8 (eight) hours as needed for nausea or vomiting. 11/05/21   Reubin Milan, MD  OXYGEN Inhale into the lungs. 2 lpm [place] nightly  Patient not taking: Reported on 02/06/2023    [provider]  promethazine (PHENERGAN) 12.5 MG tablet Take 1 tablet (12.5 mg total) by mouth every 8 (eight) hours as needed for nausea or vomiting. Patient not taking: Reported on 02/06/2023 03/22/21   Reubin Milan, MD  triamcinolone (KENALOG) 0.025 % cream Apply 1 application topically 2 (two) times daily. To external genitalia 11/25/20   Reubin Milan, MD  XARELTO 15 MG TABS tablet TAKE 1 TABLET(15 MG) BY MOUTH DAILY Patient not  taking: Reported on 02/06/2023 03/01/22   End, Cristal Deer, MD    Allergies as of 01/27/2023 - Review Complete 05/17/2022  Allergen Reaction Noted   Amoxicillin Nausea Only 10/20/2020   Eliquis [apixaban] Nausea Only 09/14/2020    Family History  Problem Relation Age of Onset   Breast cancer Sister     Social History   Socioeconomic History   Marital status: Married    Spouse name: Not on file   Number of children: 2   Years of education: Not on file   Highest education level: Not on file  Occupational History   Not on file  Tobacco Use   Smoking status: Never   Smokeless tobacco: Never  Vaping Use   Vaping status: Never Used  Substance and Sexual Activity   Alcohol use: No   Drug use: No   Sexual activity: Not on file  Other Topics Concern   Not on file  Social History Narrative   Not on file   Social Determinants of Health   Financial Resource Strain: Low Risk  (12/21/2022)   Received from [facility]   Overall Financial Resource Strain (CARDIA)    Difficulty of Paying Living Expenses: Not hard at all  Food Insecurity: No Food Insecurity (12/21/2022)   Received from [facility]   Hunger Vital Sign    Worried About Running Out of Food in the Last Year: Never true    Ran Out of Food in the Last Year: Never true  Transportation Needs: No Transportation Needs (12/21/2022)   Received from [facility] - [facility]    In the past 12 months, has lack of transportation kept you from medical appointments or from getting medications?: No    Lack of Transportation (Non-Medical): No  Physical Activity: Sufficiently Active (02/17/2021)   Exercise Vital Sign    Days of Exercise per Week: 7 days    Minutes of Exercise per Session: 30 min  Stress: No Stress Concern Present (02/17/2021)   [employer] of Occupational Health - Occupational Stress Questionnaire    Feeling of Stress : Not at all  Social  Connections: Moderately Isolated (02/17/2021)   Social Connection and Isolation Panel [NHANES]    Frequency of Communication with Friends and Family: More than three times a week    Frequency of Social Gatherings with Friends and Family: Once a week    Attends Religious Services: Never    Database administrator or Organizations: No    Attends Banker Meetings: Never    Marital Status: Married  Catering manager Violence: Not At Risk (11/05/2021)   Humiliation, Afraid, Rape, and Kick questionnaire    Fear of Current or Ex-Partner: No    Emotionally Abused: No    Physically Abused: No    Sexually Abused: No    Review of Systems: See HPI, otherwise negative ROS  Physical Exam: Ht 5\' 3"  (1.6 m)   Wt 54.4  kg   BMI 21.26 kg/m  General:   Alert, cooperative in NAD Head:  Normocephalic and atraumatic. Respiratory:  Normal work of breathing. Cardiovascular:  RRR  Impression/Plan: [name] is here for cataract surgery.  Risks, benefits, limitations, and alternatives regarding cataract surgery have been reviewed with the patient.  Questions have been answered.  All parties agreeable.   Estanislado Pandy, MD  02/13/2023, 11:39 AM

## 2023-02-13 NOTE — Anesthesia Preprocedure Evaluation (Addendum)
Anesthesia Evaluation  Patient identified by MRN, date of birth, ID band Patient awake    Reviewed: Allergy & Precautions, H&P , NPO status , Patient's Chart, lab work & pertinent test results  Airway Mallampati: III  TM Distance: <3 FB Neck ROM: Full   Comment: Lower jaw 1 FB TMD, likely very anterior airway, likely difficult airway if intubation were needed Dental no notable dental hx. (+) Upper Dentures, Lower Dentures Upper dentures don't stay in well, so patient will remove:   Pulmonary neg pulmonary ROS, COPD   Pulmonary exam normal breath sounds clear to auscultation       Cardiovascular hypertension, Normal cardiovascular exam Rhythm:Regular Rate:Normal  06-11-21 1. Left ventricular ejection fraction, by estimation, is 60 to 65%. The  left ventricle has normal function. The left ventricle has no regional  wall motion abnormalities. There is mild left ventricular hypertrophy.  Left ventricular diastolic parameters  are consistent with Grade II diastolic dysfunction (pseudonormalization).   2. Right ventricular systolic function is normal. The right ventricular  size is normal. There is mildly elevated pulmonary artery systolic  pressure. The estimated right ventricular systolic pressure is 44.4 mmHg.   3. The mitral valve is normal in structure. No evidence of mitral valve  regurgitation. Borderline mild mitral stenosis. The mean mitral valve  gradient is 4.0 mmHg.   4. Tricuspid valve regurgitation is mild to moderate.   5. The aortic valve is normal in structure. Aortic valve regurgitation is  not visualized. No aortic stenosis is present.   6. The inferior vena cava is normal in size with greater than 50%  respiratory variability, suggesting right atrial pressure of 3 mmHg.   Not in atrial fibrillation at this time    Neuro/Psych negative neurological ROS  negative psych ROS   GI/Hepatic negative GI ROS, Neg liver  ROS,GERD  ,,  Endo/Other  negative endocrine ROS    Renal/GU negative Renal ROS  negative genitourinary   Musculoskeletal negative musculoskeletal ROS (+) Arthritis ,    Abdominal   Peds negative pediatric ROS (+)  Hematology negative hematology ROS (+)   Anesthesia Other Findings Cancer  Hypertension GERD (gastroesophageal reflux disease) Hyperlipemia Bronchiectasis  Allergy Intractable nausea and vomiting for 2 years, was medication-related, not nauseated today Acute metabolic encephalopathy COPD (chronic obstructive pulmonary disease)   Psoriatic arthritis  06-09-21 new onset atrial fibrillation    Reproductive/Obstetrics negative OB ROS                             Anesthesia Physical Anesthesia Plan  ASA: 3  Anesthesia Plan: MAC   Post-op Pain Management:    Induction: Intravenous  PONV Risk Score and Plan:   Airway Management Planned: Natural Airway and Nasal Cannula  Additional Equipment:   Intra-op Plan:   Post-operative Plan:   Informed Consent: I have reviewed the patients History and Physical, chart, labs and discussed the procedure including the risks, benefits and alternatives for the proposed anesthesia with the patient or authorized representative who has indicated his/her understanding and acceptance.     Dental Advisory Given  Plan Discussed with: Anesthesiologist, CRNA and Surgeon  Anesthesia Plan Comments: (Patient consented for risks of anesthesia including but not limited to:  - adverse reactions to medications - damage to eyes, teeth, lips or other oral mucosa - nerve damage due to positioning  - sore throat or hoarseness - Damage to heart, brain, nerves, lungs, other parts of body or  loss of life  Patient voiced understanding and assent.)       Anesthesia Quick Evaluation

## 2023-02-14 ENCOUNTER — Encounter: Payer: Self-pay | Admitting: Ophthalmology

## 2023-02-14 NOTE — Anesthesia Postprocedure Evaluation (Signed)
Anesthesia Post Note  Patient: [name]  Procedure(s) Performed: CATARACT EXTRACTION PHACO AND INTRAOCULAR LENS PLACEMENT (IOC) LEFT  KDB GONIOTOMY (Left: Eye)  Patient location during evaluation: PACU Anesthesia Type: MAC Level of consciousness: awake and alert Pain management: pain level controlled Vital Signs Assessment: post-procedure vital signs reviewed and stable Respiratory status: spontaneous breathing, nonlabored ventilation, respiratory function stable and patient connected to nasal cannula oxygen Cardiovascular status: stable and blood pressure returned to baseline Postop Assessment: no apparent nausea or vomiting Anesthetic complications: no   No notable events documented.   Last Vitals:  Vitals:   02/13/23 1309 02/13/23 1315  BP: (!) 143/68 (!) 154/47  Pulse: 65 65  Resp: 15 16  Temp: (!) 36.1 C (!) 36.1 C  SpO2: 100% 98%    Last Pain:  Vitals:   02/13/23 1142  TempSrc: Temporal                 Jamiaya Bina C Keldrick Pomplun

## 2023-02-16 ENCOUNTER — Encounter: Payer: Self-pay | Admitting: Ophthalmology

## 2023-02-17 NOTE — Anesthesia Preprocedure Evaluation (Addendum)
Anesthesia Evaluation  Patient identified by MRN, date of birth, ID band Patient awake    Reviewed: Allergy & Precautions, H&P , NPO status , Patient's Chart, lab work & pertinent test results  Airway Mallampati: III  TM Distance: <3 FB Neck ROM: Full   Comment: Comment: Lower jaw 1 FB TMD, likely very anterior airway, likely difficult airway if intubation were needed Dental no notable dental hx.  Upper dentures don't stay in well, so patient will remove: :   Pulmonary neg pulmonary ROS, COPD   Pulmonary exam normal breath sounds clear to auscultation       Cardiovascular hypertension, Normal cardiovascular exam Rhythm:Regular Rate:Normal  06-11-21 1. Left ventricular ejection fraction, by estimation, is 60 to 65%. The  left ventricle has normal function. The left ventricle has no regional  wall motion abnormalities. There is mild left ventricular hypertrophy.  Left ventricular diastolic parameters  are consistent with Grade II diastolic dysfunction (pseudonormalization).   2. Right ventricular systolic function is normal. The right ventricular  size is normal. There is mildly elevated pulmonary artery systolic  pressure. The estimated right ventricular systolic pressure is 44.4 mmHg.   3. The mitral valve is normal in structure. No evidence of mitral valve  regurgitation. Borderline mild mitral stenosis. The mean mitral valve  gradient is 4.0 mmHg.   4. Tricuspid valve regurgitation is mild to moderate.   5. The aortic valve is normal in structure. Aortic valve regurgitation is  not visualized. No aortic stenosis is present.   6. The inferior vena cava is normal in size with greater than 50%  respiratory variability, suggesting right atrial pressure of 3 mmHg.    Not in atrial fibrillation at this time      Neuro/Psych negative neurological ROS  negative psych ROS   GI/Hepatic negative GI ROS, Neg liver ROS,GERD  ,,   Endo/Other  negative endocrine ROS    Renal/GU negative Renal ROS  negative genitourinary   Musculoskeletal negative musculoskeletal ROS (+) Arthritis ,    Abdominal   Peds negative pediatric ROS (+)  Hematology negative hematology ROS (+)   Anesthesia Other Findings Previous cataract surgery 02-13-23 Dr. Juel Burrow anesthesiologist and Domenic Moras CRNA, had Versed 2 mg IV   Hx intractable n/v but that has resolved Cancer (HCC)  Hypertension GERD (gastroesophageal reflux disease) Hyperlipemia Bronchiectasis (HCC)  Allergy Intractable nausea and vomiting  Acute metabolic encephalopathy COPD (chronic obstructive pulmonary disease) (HCC)  Psoriatic arthritis (HCC) Stage 2 moderate COPD by GOLD classification (HCC)  Acquired thrombophilia (HCC) Atherosclerosis of aorta (HCC)  Grade II diastolic dysfunction Atrial fibrillation (HCC)  Chronic nausea Mild mitral stenosis by prior echocardiogram Mild concentric left ventricular hypertrophy (LVH) Mild pulmonary hypertension (HCC)      Reproductive/Obstetrics negative OB ROS                             Anesthesia Physical Anesthesia Plan  ASA: 3  Anesthesia Plan: MAC   Post-op Pain Management:    Induction: Intravenous  PONV Risk Score and Plan:   Airway Management Planned: Natural Airway and Nasal Cannula  Additional Equipment:   Intra-op Plan:   Post-operative Plan:   Informed Consent: I have reviewed the patients History and Physical, chart, labs and discussed the procedure including the risks, benefits and alternatives for the proposed anesthesia with the patient or authorized representative who has indicated his/her understanding and acceptance.     Dental Advisory Given  Plan  Discussed with: Anesthesiologist, CRNA and Surgeon  Anesthesia Plan Comments: (Patient consented for risks of anesthesia including but not limited to:  - adverse reactions to medications - damage to  eyes, teeth, lips or other oral mucosa - nerve damage due to positioning  - sore throat or hoarseness - Damage to heart, brain, nerves, lungs, other parts of body or loss of life  Patient voiced understanding and assent.)        Anesthesia Quick Evaluation

## 2023-02-21 NOTE — Discharge Instructions (Signed)

## 2023-02-27 ENCOUNTER — Ambulatory Visit: Payer: Medicare PPO | Admitting: Anesthesiology

## 2023-02-27 ENCOUNTER — Other Ambulatory Visit: Payer: Self-pay

## 2023-02-27 ENCOUNTER — Encounter: Payer: Self-pay | Admitting: Ophthalmology

## 2023-02-27 ENCOUNTER — Encounter
Admission: RE | Disposition: A | Discharge status: Home / Self Care | Payer: Self-pay | Source: Home / Self Care | Attending: Ophthalmology

## 2023-02-27 ENCOUNTER — Ambulatory Visit
Admission: RE | Admit: 2023-02-27 | Discharge: 2023-02-27 | Disposition: A | Discharge status: Home / Self Care | Payer: Medicare PPO | Attending: Ophthalmology | Admitting: Ophthalmology

## 2023-02-27 DIAGNOSIS — J449 Chronic obstructive pulmonary disease, unspecified: Secondary | ICD-10-CM | POA: Diagnosis not present

## 2023-02-27 DIAGNOSIS — H401112 Primary open-angle glaucoma, right eye, moderate stage: Secondary | ICD-10-CM | POA: Diagnosis not present

## 2023-02-27 DIAGNOSIS — H2511 Age-related nuclear cataract, right eye: Secondary | ICD-10-CM | POA: Insufficient documentation

## 2023-02-27 DIAGNOSIS — K219 Gastro-esophageal reflux disease without esophagitis: Secondary | ICD-10-CM | POA: Insufficient documentation

## 2023-02-27 DIAGNOSIS — I10 Essential (primary) hypertension: Secondary | ICD-10-CM | POA: Diagnosis not present

## 2023-02-27 HISTORY — PX: CATARACT EXTRACTION W/PHACO: SHX586

## 2023-02-27 SURGERY — PHACOEMULSIFICATION, CATARACT, WITH IOL INSERTION
Anesthesia: Monitor Anesthesia Care | Site: Eye | Laterality: Right

## 2023-02-27 MED ORDER — LIDOCAINE HCL (PF) 2 % IJ SOLN
INTRAOCULAR | Status: DC | PRN
  Administered 2023-02-27: 1 mL via INTRAOCULAR

## 2023-02-27 MED ORDER — MIDAZOLAM HCL 2 MG/2ML IJ SOLN
INTRAMUSCULAR | Status: DC | PRN
  Administered 2023-02-27 (×2): 1 mg via INTRAVENOUS

## 2023-02-27 MED ORDER — SIGHTPATH DOSE#1 BSS IO SOLN
INTRAOCULAR | Status: DC | PRN
  Administered 2023-02-27: 98 mL via OPHTHALMIC

## 2023-02-27 MED ORDER — SIGHTPATH DOSE#1 BSS IO SOLN
INTRAOCULAR | Status: DC | PRN
  Administered 2023-02-27: 15 mL via INTRAOCULAR

## 2023-02-27 MED ORDER — ONDANSETRON HCL 4 MG/2ML IJ SOLN: 4.0000 mg | Freq: Once | INTRAMUSCULAR | Status: DC

## 2023-02-27 MED ORDER — TETRACAINE HCL 0.5 % OP SOLN
1.0000 [drp] | OPHTHALMIC | Status: DC | PRN
  Administered 2023-02-27 (×3): 1 [drp] via OPHTHALMIC

## 2023-02-27 MED ORDER — MIDAZOLAM HCL 2 MG/2ML IJ SOLN
INTRAMUSCULAR | Status: AC
  Filled 2023-02-27: qty 2

## 2023-02-27 MED ORDER — BRIMONIDINE TARTRATE-TIMOLOL 0.2-0.5 % OP SOLN
OPHTHALMIC | Status: DC | PRN
  Administered 2023-02-27: 1 [drp] via OPHTHALMIC

## 2023-02-27 MED ORDER — SIGHTPATH DOSE#1 NA HYALUR & NA CHOND-NA HYALUR IO KIT
PACK | INTRAOCULAR | Status: DC | PRN
  Administered 2023-02-27: 1 via OPHTHALMIC

## 2023-02-27 MED ORDER — ARMC OPHTHALMIC DILATING DROPS
OPHTHALMIC | Status: AC
  Filled 2023-02-27: qty 0.5

## 2023-02-27 MED ORDER — ARMC OPHTHALMIC DILATING DROPS
1.0000 | OPHTHALMIC | Status: DC | PRN
Start: 2023-02-27 — End: 2023-02-27
  Administered 2023-02-27 (×3): 1 via OPHTHALMIC

## 2023-02-27 MED ORDER — TETRACAINE HCL 0.5 % OP SOLN
OPHTHALMIC | Status: AC
  Filled 2023-02-27: qty 4

## 2023-02-27 MED ORDER — MOXIFLOXACIN HCL 0.5 % OP SOLN
OPHTHALMIC | Status: DC | PRN
  Administered 2023-02-27: .2 mL via OPHTHALMIC

## 2023-02-27 SURGICAL SUPPLY — 11 items
BLADE DUAL KAHOOK SINGLE USE (BLADE) IMPLANT
CATARACT SUITE SIGHTPATH (MISCELLANEOUS) ×1
DISSECTOR HYDRO NUCLEUS 50X22 (MISCELLANEOUS) ×1 IMPLANT
DRSG TEGADERM 2-3/8X2-3/4 SM (GAUZE/BANDAGES/DRESSINGS) ×1 IMPLANT
FEE CATARACT SUITE SIGHTPATH (MISCELLANEOUS) ×1 IMPLANT
GLOVE SURG SYN 7.5 E (GLOVE) ×1
GLOVE SURG SYN 7.5 PF PI (GLOVE) ×1 IMPLANT
GLOVE SURG SYN 8.5 E (GLOVE) ×1
GLOVE SURG SYN 8.5 PF PI (GLOVE) ×1 IMPLANT
LENS CLAREON WAGON WHEEL 23.0 (Intraocular Lens) ×1 IMPLANT
LENS IOL CLRN WGN WHL 23.0 (Intraocular Lens) IMPLANT

## 2023-02-27 NOTE — H&P (Signed)
[facility]   Primary Care Physician:  Danella Penton, MD Ophthalmologist: Dr. Deberah Pelton  Pre-Procedure History & Physical: HPI:  [name] is a 89 y.o. female here for cataract surgery.   Past Medical History:  Diagnosis Date   Acquired thrombophilia (HCC)    Acute metabolic encephalopathy 06/19/2020   Allergy    Atherosclerosis of aorta (HCC)    Atrial fibrillation (HCC)    Bronchiectasis (HCC)    Cancer (HCC)    left breast cancer    Chronic nausea    COPD (chronic obstructive pulmonary disease) (HCC)    GERD (gastroesophageal reflux disease)    Grade II diastolic dysfunction    Hyperlipemia    Hypertension    Intractable nausea and vomiting 06/20/2018   Mild concentric left ventricular hypertrophy (LVH)    Mild mitral stenosis by prior echocardiogram    Mild pulmonary hypertension (HCC)    Psoriatic arthritis (HCC)    Stage 2 moderate COPD by GOLD classification [facility])     Past Surgical History:  Procedure Laterality Date   BREAST LUMPECTOMY Left 2008   CATARACT EXTRACTION W/PHACO Left 02/13/2023   Procedure: CATARACT EXTRACTION PHACO AND INTRAOCULAR LENS PLACEMENT (IOC) LEFT  KDB GONIOTOMY;  Surgeon: Estanislado Pandy, MD;  Location: [facility];  Service: Ophthalmology;  Laterality: Left;  10.53 0:55.   CHOLECYSTECTOMY     HIP PINNING,CANNULATED Left 06/05/2020   Procedure: CANNULATED HIP PINNING;  Surgeon: Kennedy Bucker, MD;  Location: [facility] ORS;  Service: Orthopedics;  Laterality: Left;   PARTIAL HYSTERECTOMY     TEE WITHOUT CARDIOVERSION N/A 06/10/2020   Procedure: TRANSESOPHAGEAL ECHOCARDIOGRAM (TEE);  Surgeon: Debbe Odea, MD;  Location: [facility] ORS;  Service: Cardiovascular;  Laterality: N/A;    Prior to Admission medications   Medication Sig Start Date End Date Taking? Authorizing Provider  ASPIRIN 81 PO Take by mouth daily.    [provider]  atorvastatin (LIPITOR) 20 MG tablet TAKE 1 TABLET(20 MG) BY MOUTH  DAILY Patient not taking: Reported on 02/06/2023 10/07/21   Reubin Milan, MD  azelastine (ASTELIN) 0.1 % nasal spray Place 1 spray into both nostrils 2 (two) times daily. Use in each nostril as directed Patient not taking: Reported on 02/06/2023 11/05/21   Reubin Milan, MD  cetirizine (ZYRTEC) 10 MG tablet Take 1 tablet (10 mg total) by mouth daily. 06/11/20   Tresa Moore, MD  famotidine (PEPCID) 10 MG tablet Take 10 mg by mouth daily. Patient not taking: Reported on 02/06/2023    [provider]  fluconazole (DIFLUCAN) 150 MG tablet Take 1 tablet (150 mg total) by mouth daily. -For your yeast infection, start the Diflucan (fluconazole)- Take one pill today (day 1). If you're still having symptoms in 3 days, take the second pill. Patient not taking: Reported on 02/13/2023 07/24/21   Rhys Martini, PA-C  fluticasone [facility]) 50 MCG/ACT nasal spray SHAKE LIQUID AND USE 2 SPRAYS IN [facility] NOSTRIL DAILY AS NEEDED FOR ALLERGIES OR RHINITIS Patient not taking: Reported on 02/06/2023 01/14/21   Reubin Milan, MD  Fluticasone-Umeclidin-Vilant (TRELEGY ELLIPTA) 100-62.5-25 MCG/ACT AEPB Inhale into the lungs daily.    [provider]  folic acid (FOLVITE) 1 MG tablet Take 1 mg by mouth daily. 07/13/21   [provider]  hydrochlorothiazide (MICROZIDE) 12.5 MG capsule TAKE 1 CAPSULE(12.5 MG) BY MOUTH DAILY 03/01/22   End, Cristal Deer, MD  magnesium oxide (MAG-OX) 400 (240 Mg) MG tablet Take 400 mg by mouth daily.  [provider]  methotrexate (RHEUMATREX) 2.5 MG tablet Take 2.5 mg by mouth once a week. Caution:Chemotherapy. Protect from light.    [provider]  metoprolol tartrate (LOPRESSOR) 25 MG tablet Take 0.5 tablets (12.5 mg total) by mouth 2 (two) times daily. 10/11/21   Furth, Cadence H, PA-C  omeprazole (PRILOSEC) 40 MG capsule Take 40 mg by mouth daily.    [provider]  ondansetron (ZOFRAN) 4 MG tablet Take 1 tablet (4 mg total) by  mouth every 8 (eight) hours as needed for nausea or vomiting. 11/05/21   Reubin Milan, MD  OXYGEN Inhale into the lungs. 2 lpm [place] nightly Patient not taking: Reported on 02/06/2023    [provider]  promethazine (PHENERGAN) 12.5 MG tablet Take 1 tablet (12.5 mg total) by mouth every 8 (eight) hours as needed for nausea or vomiting. Patient not taking: Reported on 02/06/2023 03/22/21   Reubin Milan, MD  triamcinolone (KENALOG) 0.025 % cream Apply 1 application topically 2 (two) times daily. To external genitalia Patient not taking: Reported on 02/13/2023 11/25/20   Reubin Milan, MD  XARELTO 15 MG TABS tablet TAKE 1 TABLET(15 MG) BY MOUTH DAILY Patient not taking: Reported on 02/06/2023 03/01/22   End, Cristal Deer, MD    Allergies as of 01/27/2023 - Review Complete 05/17/2022  Allergen Reaction Noted   Amoxicillin Nausea Only 10/20/2020   Eliquis [apixaban] Nausea Only 09/14/2020    Family History  Problem Relation Age of Onset   Breast cancer Sister     Social History   Socioeconomic History   Marital status: Married    Spouse name: Not on file   Number of children: 2   Years of education: Not on file   Highest education level: Not on file  Occupational History   Not on file  Tobacco Use   Smoking status: Never   Smokeless tobacco: Never  Vaping Use   Vaping status: Never Used  Substance and Sexual Activity   Alcohol use: No   Drug use: No   Sexual activity: Not on file  Other Topics Concern   Not on file  Social History Narrative   Not on file   Social Drivers of Health   Financial Resource Strain: Low Risk  (12/21/2022)   Received from [facility]   Overall Financial Resource Strain (CARDIA)    Difficulty of Paying Living Expenses: Not hard at all  Food Insecurity: No Food Insecurity (12/21/2022)   Received from [facility]   Hunger Vital Sign    Worried About Running Out of Food in the Last Year: Never true     Ran Out of Food in the Last Year: Never true  Transportation Needs: No Transportation Needs (12/21/2022)   Received from [facility] Transportation    In the past 12 months, has lack of transportation kept you from medical appointments or from getting medications?: No    Lack of Transportation (Non-Medical): No  Physical Activity: Sufficiently Active (02/17/2021)   Exercise Vital Sign    Days of Exercise per Week: 7 days    Minutes of Exercise per Session: 30 min  Stress: No Stress Concern Present (02/17/2021)   [employer] of Occupational Health - Occupational Stress Questionnaire    Feeling of Stress : Not at all  Social Connections: Moderately Isolated (02/17/2021)   Social Connection and Isolation Panel [NHANES]    Frequency of Communication with Friends and Family:  More than three times a week    Frequency of Social Gatherings with Friends and Family: Once a week    Attends Religious Services: Never    Database administrator or Organizations: No    Attends Banker Meetings: Never    Marital Status: Married  Catering manager Violence: Not At Risk (11/05/2021)   Humiliation, Afraid, Rape, and Kick questionnaire    Fear of Current or Ex-Partner: No    Emotionally Abused: No    Physically Abused: No    Sexually Abused: No    Review of Systems: See HPI, otherwise negative ROS  Physical Exam: Ht 5\' 3"  (1.6 m)   Wt 55.6 kg   BMI 21.70 kg/m  General:   Alert, cooperative in NAD Head:  Normocephalic and atraumatic. Respiratory:  Normal work of breathing. Cardiovascular:  RRR  Impression/Plan: [name] is here for cataract surgery.  Risks, benefits, limitations, and alternatives regarding cataract surgery have been reviewed with the patient.  Questions have been answered.  All parties agreeable.   Estanislado Pandy, MD  02/27/2023, 11:20 AM

## 2023-02-27 NOTE — Transfer of Care (Signed)
Immediate Anesthesia Transfer of Care Note  Patient: [name]  Procedure(s) Performed: CATARACT EXTRACTION PHACO AND INTRAOCULAR LENS PLACEMENT (IOC) RIGHT  [place], goniotomy 15.90 01:28.6 (Right: Eye)  Patient Location: PACU  Anesthesia Type: MAC  Level of Consciousness: awake, alert  and patient cooperative  Airway and Oxygen Therapy: Patient Spontanous Breathing and Patient connected to supplemental oxygen  Post-op Assessment: Post-op Vital signs reviewed, Patient's Cardiovascular Status Stable, Respiratory Function Stable, Patent Airway and No signs of Nausea or vomiting  Post-op Vital Signs: Reviewed and stable  Complications: No notable events documented.

## 2023-02-27 NOTE — Op Note (Signed)
OPERATIVE NOTE  [name] [id number] 02/27/2023   PREOPERATIVE DIAGNOSIS: Nuclear sclerotic cataract right eye. H25.11  Moderate stage Primary Open Angle Glaucoma right eye H40.1112   POSTOPERATIVE DIAGNOSIS: Nuclear sclerotic cataract right eye. H25.11  Moderate stage Primary Open Angle Glaucoma right eye H40.1112   PROCEDURE:  Phacoemusification with posterior chamber intraocular lens placement of the right eye  [place] Dual Blade goniotomy right eye Ultrasound time: Procedure(s): CATARACT EXTRACTION PHACO AND INTRAOCULAR LENS PLACEMENT (IOC) RIGHT  [place], goniotomy 15.90 01:28.6 (Right)  LENS:   Implant Name Type Inv. Item Serial No. Manufacturer Lot No. LRB No. Used Action  LENS [place] WAGON WHEEL 23.0 - [id number] Intraocular Lens LENS [place] WAGON WHEEL 23.0 [id number] [employer]  Right 1 Implanted      SURGEON:  Julious Payer. Rolley Sims, MD   ANESTHESIA:  Topical with tetracaine drops, augmented with 1% preservative-free intracameral lidocaine.   COMPLICATIONS:  None.   DESCRIPTION OF PROCEDURE:  The patient was identified in the holding room and transported to the operating room and placed in the supine position under the operating microscope.  The right eye was identified as the operative eye, which was prepped and draped in the usual sterile ophthalmic fashion.   A 1 millimeter clear-corneal paracentesis was made superotemporally. Preservative-free 1% lidocaine mixed with 1:1,000 bisulfite-free aqueous solution of epinephrine was injected into the anterior chamber. The anterior chamber was then filled with Viscoat viscoelastic. A 2.4 millimeter keratome was used to make a clear-corneal incision inferotemporally. A curvilinear capsulorrhexis was made with a cystotome and capsulorrhexis forceps.   The microscope was adjusted and a gonioprism was used to visualize the trabecular meshwork.  [facility] Dual Blade was advanced across the anterior chamber under viscoelastic.  The  blade was used to mark the trabecular meshwork at the 1:30 position.  The blade was placed two clock hours clockwise into the meshwork.  Proper positioning was confirmed.  The blade was passed counterclockwise through the meshwork to excise approximately two to three clock-hours of trabecular meshwork. The microscope was adjusted back to its original position.  Balanced salt solution was used to hydrodissect and hydrodelineate the nucleus. Phacoemulsification was then used to remove the lens nucleus and epinucleus. The remaining cortex was then removed using the irrigation and aspiration handpiece. Provisc was then placed into the capsular bag to distend it for lens placement. A +23.00 D [id number] intraocular lens was then injected into the capsular bag. The remaining viscoelastic was aspirated.   Wounds were hydrated with balanced salt solution.  The anterior chamber was inflated to a physiologic pressure with balanced salt solution.  No wound leaks were noted. Vigamox was injected intracamerally.  Timolol and Brimonidine drops were applied to the eye.  The patient was taken to the recovery room in stable condition without complications of anesthesia or surgery.  [name] 02/27/2023, 1:48 PM

## 2023-03-01 ENCOUNTER — Encounter: Payer: Self-pay | Admitting: Ophthalmology

## 2023-03-03 NOTE — Anesthesia Postprocedure Evaluation (Signed)
Anesthesia Post Note  Patient: [name]  Procedure(s) Performed: CATARACT EXTRACTION PHACO AND INTRAOCULAR LENS PLACEMENT (IOC) RIGHT  [place], goniotomy 15.90 01:28.6 (Right: Eye)  Anesthesia Type: MAC Anesthetic complications: no   No notable events documented.   Last Vitals:  Vitals:   02/27/23 1352 02/27/23 1353  BP: (!) 154/60   Pulse: 60 64  Resp: 17 11  Temp:    SpO2: 100% 100%    Last Pain:  Vitals:   02/27/23 1353  TempSrc:   PainSc: 0-No pain                 Willoughby Doell C Jesse Hirst

## 2023-05-18 ENCOUNTER — Other Ambulatory Visit: Payer: Self-pay

## 2023-05-18 ENCOUNTER — Emergency Department

## 2023-05-18 ENCOUNTER — Observation Stay
Admission: EM | Admit: 2023-05-18 | Discharge: 2023-05-19 | Disposition: A | Discharge status: Home / Self Care | Attending: Obstetrics and Gynecology | Admitting: Obstetrics and Gynecology

## 2023-05-18 DIAGNOSIS — J9611 Chronic respiratory failure with hypoxia: Secondary | ICD-10-CM | POA: Insufficient documentation

## 2023-05-18 DIAGNOSIS — J449 Chronic obstructive pulmonary disease, unspecified: Secondary | ICD-10-CM | POA: Diagnosis not present

## 2023-05-18 DIAGNOSIS — Z79899 Other long term (current) drug therapy: Secondary | ICD-10-CM | POA: Diagnosis not present

## 2023-05-18 DIAGNOSIS — I5032 Chronic diastolic (congestive) heart failure: Secondary | ICD-10-CM | POA: Diagnosis not present

## 2023-05-18 DIAGNOSIS — Z853 Personal history of malignant neoplasm of breast: Secondary | ICD-10-CM | POA: Insufficient documentation

## 2023-05-18 DIAGNOSIS — Z7982 Long term (current) use of aspirin: Secondary | ICD-10-CM | POA: Insufficient documentation

## 2023-05-18 DIAGNOSIS — R002 Palpitations: Principal | ICD-10-CM | POA: Insufficient documentation

## 2023-05-18 DIAGNOSIS — D72825 Bandemia: Secondary | ICD-10-CM | POA: Diagnosis not present

## 2023-05-18 DIAGNOSIS — L4050 Arthropathic psoriasis, unspecified: Secondary | ICD-10-CM | POA: Diagnosis not present

## 2023-05-18 DIAGNOSIS — Z1152 Encounter for screening for COVID-19: Secondary | ICD-10-CM | POA: Insufficient documentation

## 2023-05-18 DIAGNOSIS — D72829 Elevated white blood cell count, unspecified: Secondary | ICD-10-CM

## 2023-05-18 DIAGNOSIS — I110 Hypertensive heart disease with heart failure: Secondary | ICD-10-CM | POA: Diagnosis not present

## 2023-05-18 DIAGNOSIS — R0602 Shortness of breath: Secondary | ICD-10-CM | POA: Diagnosis present

## 2023-05-18 DIAGNOSIS — I480 Paroxysmal atrial fibrillation: Secondary | ICD-10-CM | POA: Diagnosis not present

## 2023-05-18 DIAGNOSIS — I2489 Other forms of acute ischemic heart disease: Secondary | ICD-10-CM | POA: Diagnosis not present

## 2023-05-18 DIAGNOSIS — I5030 Unspecified diastolic (congestive) heart failure: Secondary | ICD-10-CM

## 2023-05-18 DIAGNOSIS — I10 Essential (primary) hypertension: Secondary | ICD-10-CM | POA: Diagnosis present

## 2023-05-18 DIAGNOSIS — R0789 Other chest pain: Secondary | ICD-10-CM

## 2023-05-18 LAB — TROPONIN I (HIGH SENSITIVITY)
Troponin I (High Sensitivity): 37 ng/L — ABNORMAL HIGH (ref <18)
Troponin I (High Sensitivity): 37 ng/L — ABNORMAL HIGH (ref <18)
Troponin I (High Sensitivity): 41 ng/L — ABNORMAL HIGH (ref <18)

## 2023-05-18 LAB — BASIC METABOLIC PANEL
Anion gap: 8 (ref 5–15)
BUN: 27 mg/dL — ABNORMAL HIGH (ref 8–23)
CO2: 24 mmol/L (ref 22–32)
Calcium: 8.9 mg/dL (ref 8.9–10.3)
Chloride: 107 mmol/L (ref 98–111)
Creatinine, Ser: 0.95 mg/dL (ref 0.44–1.00)
GFR, Estimated: 58 mL/min — ABNORMAL LOW (ref >60)
Glucose, Bld: 140 mg/dL — ABNORMAL HIGH (ref 70–99)
Potassium: 3.6 mmol/L (ref 3.5–5.1)
Sodium: 139 mmol/L (ref 135–145)

## 2023-05-18 LAB — RESP PANEL BY RT-PCR (RSV, FLU A&B, COVID)  RVPGX2
Influenza A by PCR: NEGATIVE
Influenza B by PCR: NEGATIVE
Resp Syncytial Virus by PCR: NEGATIVE
SARS Coronavirus 2 by RT PCR: NEGATIVE

## 2023-05-18 LAB — CBC
HCT: 44.8 % (ref 36.0–46.0)
Hemoglobin: 14.8 g/dL (ref 12.0–15.0)
MCH: 30.1 pg (ref 26.0–34.0)
MCHC: 33 g/dL (ref 30.0–36.0)
MCV: 91.1 fL (ref 80.0–100.0)
Platelets: 259 10*3/uL (ref 150–400)
RBC: 4.92 MIL/uL (ref 3.87–5.11)
RDW: 14 % (ref 11.5–15.5)
WBC: 11.6 10*3/uL — ABNORMAL HIGH (ref 4.0–10.5)
nRBC: 0 % (ref 0.0–0.2)

## 2023-05-18 LAB — BRAIN NATRIURETIC PEPTIDE: B Natriuretic Peptide: 638 pg/mL — ABNORMAL HIGH (ref 0.0–100.0)

## 2023-05-18 LAB — TSH: TSH: 2.812 u[IU]/mL (ref 0.350–4.500)

## 2023-05-18 MED ORDER — SODIUM CHLORIDE 0.9 % IV BOLUS
500.0000 mL | Freq: Once | INTRAVENOUS | Status: AC
  Administered 2023-05-18: 500 mL via INTRAVENOUS

## 2023-05-18 MED ORDER — ACETAMINOPHEN 325 MG PO TABS
650.0000 mg | ORAL_TABLET | Freq: Four times a day (QID) | ORAL | Status: DC | PRN
  Administered 2023-05-18: 650 mg via ORAL
  Filled 2023-05-18: qty 2

## 2023-05-18 MED ORDER — LORATADINE 10 MG PO TABS
10.0000 mg | ORAL_TABLET | Freq: Every day | ORAL | Status: DC
  Administered 2023-05-19: 10 mg via ORAL
  Filled 2023-05-18: qty 1

## 2023-05-18 MED ORDER — PANTOPRAZOLE SODIUM 40 MG PO TBEC
40.0000 mg | DELAYED_RELEASE_TABLET | Freq: Every day | ORAL | Status: DC
  Administered 2023-05-19: 40 mg via ORAL
  Filled 2023-05-18: qty 1

## 2023-05-18 MED ORDER — ONDANSETRON HCL 4 MG/2ML IJ SOLN: 4.0000 mg | Freq: Four times a day (QID) | INTRAMUSCULAR | Status: DC | PRN

## 2023-05-18 MED ORDER — METOPROLOL TARTRATE 25 MG PO TABS
12.5000 mg | ORAL_TABLET | Freq: Two times a day (BID) | ORAL | Status: DC
  Administered 2023-05-18 – 2023-05-19 (×2): 12.5 mg via ORAL
  Filled 2023-05-18 (×2): qty 1

## 2023-05-18 MED ORDER — FLUTICASONE FUROATE-VILANTEROL 100-25 MCG/ACT IN AEPB
1.0000 | INHALATION_SPRAY | Freq: Every day | RESPIRATORY_TRACT | Status: DC
  Filled 2023-05-18: qty 28

## 2023-05-18 MED ORDER — POLYETHYLENE GLYCOL 3350 17 G PO PACK: 17.0000 g | PACK | Freq: Every day | ORAL | Status: DC | PRN

## 2023-05-18 MED ORDER — ASPIRIN 325 MG PO TABS
325.0000 mg | ORAL_TABLET | Freq: Every day | ORAL | Status: DC
  Administered 2023-05-18: 325 mg via ORAL
  Filled 2023-05-18: qty 1

## 2023-05-18 MED ORDER — SODIUM CHLORIDE 0.9% FLUSH
3.0000 mL | Freq: Two times a day (BID) | INTRAVENOUS | Status: DC
  Administered 2023-05-18 – 2023-05-19 (×3): 3 mL via INTRAVENOUS

## 2023-05-18 MED ORDER — IOHEXOL 350 MG/ML SOLN
75.0000 mL | Freq: Once | INTRAVENOUS | Status: AC | PRN
  Administered 2023-05-18: 50 mL via INTRAVENOUS

## 2023-05-18 MED ORDER — ASPIRIN 81 MG PO TBEC
81.0000 mg | DELAYED_RELEASE_TABLET | Freq: Every day | ORAL | Status: DC
  Administered 2023-05-19: 81 mg via ORAL
  Filled 2023-05-18: qty 1

## 2023-05-18 MED ORDER — FOLIC ACID 1 MG PO TABS
1.0000 mg | ORAL_TABLET | Freq: Every day | ORAL | Status: DC
  Administered 2023-05-19: 1 mg via ORAL
  Filled 2023-05-18: qty 1

## 2023-05-18 MED ORDER — ENOXAPARIN SODIUM 40 MG/0.4ML IJ SOSY
40.0000 mg | PREFILLED_SYRINGE | INTRAMUSCULAR | Status: DC
  Administered 2023-05-18: 40 mg via SUBCUTANEOUS
  Filled 2023-05-18: qty 0.4

## 2023-05-18 MED ORDER — HYDROCHLOROTHIAZIDE 12.5 MG PO TABS
12.5000 mg | ORAL_TABLET | Freq: Every day | ORAL | Status: DC
  Administered 2023-05-19: 12.5 mg via ORAL
  Filled 2023-05-18: qty 1

## 2023-05-18 MED ORDER — ACETAMINOPHEN 650 MG RE SUPP: 650.0000 mg | Freq: Four times a day (QID) | RECTAL | Status: DC | PRN

## 2023-05-18 MED ORDER — ONDANSETRON HCL 4 MG PO TABS: 4.0000 mg | ORAL_TABLET | Freq: Four times a day (QID) | ORAL | Status: DC | PRN

## 2023-05-18 MED ORDER — UMECLIDINIUM BROMIDE 62.5 MCG/ACT IN AEPB
1.0000 | INHALATION_SPRAY | Freq: Every day | RESPIRATORY_TRACT | Status: DC
  Filled 2023-05-18: qty 7

## 2023-05-18 NOTE — ED Triage Notes (Signed)
 Patient states last night she felt like her heart was beating fast, checked at home and it was in the 130's; reports feeling weak and fatigued.

## 2023-05-18 NOTE — Assessment & Plan Note (Signed)
-   Resume home regimen

## 2023-05-18 NOTE — Assessment & Plan Note (Signed)
 Very minimally elevated troponin, likely due to demand in the setting of prolonged tachycardia.  She is chest pain-free and repeat EKGs without ischemic changes.

## 2023-05-18 NOTE — Assessment & Plan Note (Signed)
 Per chart view, patient has a history of HFpEF with last EF of 60-65% and grade 2 diastolic dysfunction.  Interestingly, her BNP is elevated at 638, however she appears euvolemic on examination.  There is no evidence of pulmonary edema on CTA.  Rales heard on examination are likely due to underlying ILD.  Due to this, we will hold off on IV diuretic use.  - Daily weights

## 2023-05-18 NOTE — Assessment & Plan Note (Signed)
 Per chart review, patient has a distant history of atrial fibrillation after a femur fracture in 2022 but has not had any recurrence and remains off anticoagulation.  As noted above, wonder if current episode of palpitations may be due to flutter.

## 2023-05-18 NOTE — Assessment & Plan Note (Signed)
 On methotrexate, managed by rheumatology.  - Continue daily folic acid

## 2023-05-18 NOTE — Assessment & Plan Note (Addendum)
 Patient is presenting with sudden onset palpitations associated with shortness of breath and bilateral arm weakness, with initial EKG rhythm unclear.  Rate is regular, however could be sinus tachycardia with first-degree AV block versus atrial flutter with 2-1 block.  She does have a history of atrial fibrillation but no history of flutter.  She is now back into normal sinus rhythm with regular rate.   - Telemetry monitoring - Recheck troponin later this evening - CTA pending - Echocardiogram - Hold off on heparin infusion - S/p one-time dose of aspirin

## 2023-05-18 NOTE — Assessment & Plan Note (Signed)
 Leukocytosis noted today, with no obvious signs of infection.  It seems she previously has a history frequently elevated WBC.  - Recheck CBC in the a.m.

## 2023-05-18 NOTE — H&P (Addendum)
 History and Physical    Patient: [name] JYN:[id number] DOB: [birth year] DOA: 05/18/2023 DOS: the patient was seen and examined on 05/18/2023 PCP: Danella Penton, MD  Patient coming from: Home  Chief Complaint:  Chief Complaint  Patient presents with   Palpitations   HPI: [name] is a 89 y.o. female with medical history significant of COPD, bronchiectasis, chronic hypoxic respiratory failure on 2 L, atrial fibrillation not on AC, HFpEF, Psoriatic arthritis, mild pulmonary hypertension, hypertension, hyperlipidemia, breast cancer s/p lumpectomy, who presents to the ED due to palpitations.  [name] states that in the last month, she has had 3 episodes of palpitations that have become suddenly, and have been associated with nausea, shortness of breath and bilateral arm heaviness.  No chest pain or diaphoresis.  Typically the episodes last less than an hour and when she takes her oxygen level and heart rate measurements in the morning, they are back to normal.  However, yesterday evening, she had sudden onset symptoms and when she took her vitals this morning, her heart rate was still in the 140s.  Due to this, she came to the ED.  She notes now that her heart rate is back to normal, she feels back to baseline with no palpitations or shortness of breath.  She denies any chest pain, lower extremity swelling, orthopnea.  She denies any recent illness, rhinorrhea, sinus congestion, cough, vomiting, diarrhea.  ED course: On arrival to the ED, patient was hypertensive at 148/92 with heart rate of 124.  She was saturating at 94% on room air.  She was afebrile at 98.5.  Initial workup notable for WBC of 11.6, and unremarkable BMP.  TSH within normal limits.  Troponin elevated at 37 with flat trend to 37.  Patient started on aspirin and IV fluids.  TRH contacted for admission.  Review of Systems: As mentioned in the history of present illness. All other systems reviewed and are negative. Past  Medical History:  Diagnosis Date   Acquired thrombophilia (HCC)    Acute metabolic encephalopathy 06/19/2020   Allergy    Atherosclerosis of aorta (HCC)    Atrial fibrillation (HCC)    Bronchiectasis (HCC)    Cancer (HCC)    left breast cancer    Chronic nausea    COPD (chronic obstructive pulmonary disease) (HCC)    GERD (gastroesophageal reflux disease)    Grade II diastolic dysfunction    Hyperlipemia    Hypertension    Intractable nausea and vomiting 06/20/2018   Mild concentric left ventricular hypertrophy (LVH)    Mild mitral stenosis by prior echocardiogram    Mild pulmonary hypertension (HCC)    Psoriatic arthritis (HCC)    Stage 2 moderate COPD by GOLD classification [facility])    Past Surgical History:  Procedure Laterality Date   BREAST LUMPECTOMY Left 2008   CATARACT EXTRACTION W/PHACO Left 02/13/2023   Procedure: CATARACT EXTRACTION PHACO AND INTRAOCULAR LENS PLACEMENT (IOC) LEFT  KDB GONIOTOMY;  Surgeon: Estanislado Pandy, MD;  Location: [facility];  Service: Ophthalmology;  Laterality: Left;  10.53 0:55.   CATARACT EXTRACTION W/PHACO Right 02/27/2023   Procedure: CATARACT EXTRACTION PHACO AND INTRAOCULAR LENS PLACEMENT (IOC) RIGHT  [place], goniotomy 15.90 01:28.6;  Surgeon: Estanislado Pandy, MD;  Location: [facility];  Service: Ophthalmology;  Laterality: Right;   CHOLECYSTECTOMY     HIP PINNING,CANNULATED Left 06/05/2020   Procedure: CANNULATED HIP PINNING;  Surgeon: Kennedy Bucker, MD;  Location: [facility];  Service: Orthopedics;  Laterality: Left;   PARTIAL HYSTERECTOMY     TEE WITHOUT CARDIOVERSION N/A 06/10/2020   Procedure: TRANSESOPHAGEAL ECHOCARDIOGRAM (TEE);  Surgeon: Debbe Odea, MD;  Location: [facility];  Service: Cardiovascular;  Laterality: N/A;   Social History:  reports that she has never smoked. She has never used smokeless tobacco. She reports that she does not drink alcohol and does not use drugs.  Allergies  Allergen  Reactions   Amoxicillin Nausea Only   Eliquis [Apixaban] Nausea Only    Family History  Problem Relation Age of Onset   Breast cancer Sister     Prior to Admission medications   Medication Sig Start Date End Date Taking? Authorizing Provider  albuterol (VENTOLIN HFA) 108 (90 Base) MCG/ACT inhaler Inhale 2 puffs into the lungs every 6 (six) hours as needed for shortness of breath or wheezing. 06/28/22 06/28/23 Yes [provider]  ASPIRIN 81 PO Take 81 mg by mouth daily.   Yes [provider]  cetirizine (ZYRTEC) 10 MG tablet Take 1 tablet (10 mg total) by mouth daily. 06/11/20  Yes Sreenath, Sudheer B, MD  Fluticasone-Umeclidin-Vilant (TRELEGY ELLIPTA) 100-62.5-25 MCG/ACT AEPB Inhale 1 puff into the lungs daily.   Yes [provider]  folic acid (FOLVITE) 1 MG tablet Take 1 mg by mouth daily. 07/13/21  Yes [provider]  hydrochlorothiazide (MICROZIDE) 12.5 MG capsule TAKE 1 CAPSULE(12.5 MG) BY MOUTH DAILY 03/01/22  Yes End, Cristal Deer, MD  magnesium oxide (MAG-OX) 400 (240 Mg) MG tablet Take 400 mg by mouth daily.   Yes [provider]  methotrexate (RHEUMATREX) 2.5 MG tablet Take 2.5 mg by mouth once a week. Caution:Chemotherapy. Protect from light.   Yes [provider]  metoprolol tartrate (LOPRESSOR) 25 MG tablet Take 0.5 tablets (12.5 mg total) by mouth 2 (two) times daily. 10/11/21  Yes Furth, Cadence H, PA-C  omeprazole (PRILOSEC) 40 MG capsule Take 40 mg by mouth daily.   Yes [provider]    Physical Exam: Vitals:   05/18/23 1140 05/18/23 1145 05/18/23 1200 05/18/23 1422  BP:  128/63 (!) 146/63   Pulse: 74 73 71   Resp:   18   Temp:    (!) 97.5 F (36.4 C)  TempSrc:    Oral  SpO2: 100% 100% 100%   Weight:      Height:       Physical Exam Vitals and nursing note reviewed.  Constitutional:      General: She is not in acute distress.    Appearance: She is normal weight. She is not toxic-appearing.  HENT:      Head: Normocephalic and atraumatic.     Mouth/Throat:     Mouth: Mucous membranes are moist.     Pharynx: Oropharynx is clear.  Eyes:     Conjunctiva/sclera: Conjunctivae normal.     Pupils: Pupils are equal, round, and reactive to light.  Cardiovascular:     Rate and Rhythm: Normal rate and regular rhythm.     Heart sounds: No murmur heard.    No gallop.  Pulmonary:     Effort: Pulmonary effort is normal. No respiratory distress.     Breath sounds: Rales (fine rales throughout) present. No wheezing or rhonchi.  Abdominal:     General: Bowel sounds are normal.     Palpations: Abdomen is soft.  Musculoskeletal:     Right lower leg: No edema.     Left lower leg: No edema.  Skin:    General: Skin is warm  and dry.  Neurological:     General: No focal deficit present.     Mental Status: She is alert and oriented to person, place, and time. Mental status is at baseline.  Psychiatric:        Mood and Affect: Mood normal.        Behavior: Behavior normal.    Data Reviewed: CBC with WBC of 11.6, hemoglobin of 14.8, platelets of 259 BMP with sodium of 139, potassium 3.6, bicarb 24, glucose 140, BUN 27, creatinine 0.95 with GFR 58. Troponin 37 and then 37 TSH 2.8 Influenza, RSV and COVID-19 PCR negative  Initial EKG personally reviewed.  Narrow complex tachycardia with rate of 124.  Consistent either with sinus tachycardia with first-degree AV block versus flutter with 2-1 block.  Repeat EKG personally reviewed.  Sinus rhythm now with rate of 73.  No AV block.  CT Angio Chest PE W and/or Wo Contrast Result Date: 05/18/2023 CLINICAL DATA:  Pulmonary embolism (PE) suspected, high prob. Tachycardia. Weakness, fatigue EXAM: CT ANGIOGRAPHY CHEST WITH CONTRAST TECHNIQUE: Multidetector CT imaging of the chest was performed using the standard protocol during bolus administration of intravenous contrast. Multiplanar CT image reconstructions and MIPs were obtained to evaluate the vascular anatomy.  RADIATION DOSE REDUCTION: This exam was performed according to the departmental dose-optimization program which includes automated exposure control, adjustment of the mA and/or kV according to patient size and/or use of iterative reconstruction technique. CONTRAST:  50mL OMNIPAQUE IOHEXOL 350 MG/ML SOLN COMPARISON:  08/16/2020 FINDINGS: Cardiovascular: No filling defects in the pulmonary arteries to suggest pulmonary emboli. Heart is borderline in size. Coronary artery and aortic calcifications. No evidence of aortic aneurysm. Mediastinum/Nodes: No mediastinal, hilar, or axillary adenopathy. Trachea and esophagus are unremarkable. Thyroid unremarkable. Lungs/Pleura: Biapical scarring. Few small scattered nodules are stable since prior study compatible with benign nodules. Scarring in the lung bases. No confluent acute opacities or effusions. Upper Abdomen: No acute findings Musculoskeletal: Chest wall soft tissues are unremarkable. No acute bony abnormality. Review of the MIP images confirms the above findings. IMPRESSION: No evidence of pulmonary embolus. Coronary artery disease. Biapical and bibasilar scarring. Aortic Atherosclerosis (ICD10-I70.0). Electronically Signed   By: Charlett Nose M.D.   On: 05/18/2023 14:58   DG Chest 2 View Result Date: 05/18/2023 CLINICAL DATA:  Cardiac palpitations. EXAM: CHEST - 2 VIEW COMPARISON:  August 31, 2021. FINDINGS: The heart size and mediastinal contours are within normal limits. Both lungs are clear. Hyperinflation of the lungs is again noted. The visualized skeletal structures are unremarkable. IMPRESSION: No active cardiopulmonary disease. Electronically Signed   By: Lupita Raider M.D.   On: 05/18/2023 12:34   Results are pending, will review when available.  Assessment and Plan:  * Palpitations Patient is presenting with sudden onset palpitations associated with shortness of breath and bilateral arm weakness, with initial EKG rhythm unclear.  Rate is regular,  however could be sinus tachycardia with first-degree AV block versus atrial flutter with 2-1 block.  She does have a history of atrial fibrillation but no history of flutter.  She is now back into normal sinus rhythm with regular rate.   - Telemetry monitoring - Recheck troponin later this evening - CTA pending - Echocardiogram - Hold off on heparin infusion - S/p one-time dose of aspirin  Demand ischemia (HCC) Very minimally elevated troponin, likely due to demand in the setting of prolonged tachycardia.  She is chest pain-free and repeat EKGs without ischemic changes.  (HFpEF) heart failure with preserved  ejection fraction (HCC) Per chart view, patient has a history of HFpEF with last EF of 60-65% and grade 2 diastolic dysfunction.  Interestingly, her BNP is elevated at 638, however she appears euvolemic on examination.  There is no evidence of pulmonary edema on CTA.  Rales heard on examination are likely due to underlying ILD.  Due to this, we will hold off on IV diuretic use.  - Daily weights  Leukocytosis Leukocytosis noted today, with no obvious signs of infection.  It seems she previously has a history frequently elevated WBC.  - Recheck CBC in the a.m.  Psoriatic arthritis (HCC) On methotrexate, managed by rheumatology.  - Continue daily folic acid  Chronic respiratory failure with hypoxia (HCC) Patient was historically on 2 L of supplemental oxygen chronically.  She no longer has required continuous oxygen since starting on Trelegy for her COPD though.  - Continue supplemental oxygen as needed to maintain oxygen saturation above 88%  AF (paroxysmal atrial fibrillation) (HCC) Per chart review, patient has a distant history of atrial fibrillation after a femur fracture in 2022 but has not had any recurrence and remains off anticoagulation.  As noted above, wonder if current episode of palpitations may be due to flutter.  Stage 2 moderate COPD by GOLD classification (HCC) -  Continue home bronchodilators - DuoNebs as needed  Essential (primary) hypertension - Resume home regimen  Advance Care Planning:   Code Status: Full Code verified by patient with her husband at bedside  Consults: None  Family Communication: Patient's husband updated at bedside  Severity of Illness: The appropriate patient status for this patient is OBSERVATION. Observation status is judged to be reasonable and necessary in order to provide the required intensity of service to ensure the patient's safety. The patient's presenting symptoms, physical exam findings, and initial radiographic and laboratory data in the context of their medical condition is felt to place them at decreased risk for further clinical deterioration. Furthermore, it is anticipated that the patient will be medically stable for discharge from the hospital within 2 midnights of admission.   Author: Verdene Lennert, MD 05/18/2023 3:49 PM  For on call review [email or web address].

## 2023-05-18 NOTE — ED Provider Notes (Signed)
 [facility] Provider Note    Event Date/Time   First MD Initiated Contact with Patient 05/18/23 1041     (approximate)   History   Palpitations   HPI  [name] is a 89 y.o. female with a history of A-fib not currently on anticoagulation presents to the ER for evaluation of palpitations and shortness of breath starting last night.  No recent illnesses.  No lower extremity swelling.  No pleuritic pain.  Denies any abdominal pain nausea or vomiting.  Have history of COPD does not feel like she is wheezing.     Physical Exam   Triage Vital Signs: ED Triage Vitals [05/18/23 0950]  Encounter Vitals Group     BP (!) 148/92     Systolic BP Percentile      Diastolic BP Percentile      Pulse Rate (!) 124     Resp 20     Temp 98.5 F (36.9 C)     Temp Source Oral     SpO2 94 %     Weight 120 lb (54.4 kg)     Height 5\' 2"  (1.575 m)     Head Circumference      Peak Flow      Pain Score 0     Pain Loc      Pain Education      Exclude from Growth Chart     Most recent vital signs: Vitals:   05/18/23 1200 05/18/23 1422  BP: (!) 146/63   Pulse: 71   Resp: 18   Temp:  (!) 97.5 F (36.4 C)  SpO2: 100%      Constitutional: Alert  Eyes: Conjunctivae are normal.  Head: Atraumatic. Nose: No congestion/rhinnorhea. Mouth/Throat: Mucous membranes are moist.   Neck: Painless ROM.  Cardiovascular:   Good peripheral circulation.  Tachycardic but well-perfused. Respiratory: Normal respiratory effort.  No retractions.  Gastrointestinal: Soft and nontender.  Musculoskeletal:  no deformity Neurologic:  MAE spontaneously. No gross focal neurologic deficits are appreciated.  Skin:  Skin is warm, dry and intact. No rash noted. Psychiatric: Mood and affect are normal. Speech and behavior are normal.    ED Results / Procedures / Treatments   Labs (all labs ordered are listed, but only abnormal results are displayed) Labs Reviewed  BASIC METABOLIC  PANEL - Abnormal; Notable for the following components:      Result Value   Glucose, Bld 140 (*)    BUN 27 (*)    GFR, Estimated 58 (*)    All other components within normal limits  CBC - Abnormal; Notable for the following components:   WBC 11.6 (*)    All other components within normal limits  TROPONIN I (HIGH SENSITIVITY) - Abnormal; Notable for the following components:   Troponin I (High Sensitivity) 37 (*)    All other components within normal limits  TROPONIN I (HIGH SENSITIVITY) - Abnormal; Notable for the following components:   Troponin I (High Sensitivity) 37 (*)    All other components within normal limits  RESP PANEL BY RT-PCR (RSV, FLU A&B, COVID)  [id number]  TSH     EKG  ED ECG REPORT I, Willy Eddy, the attending physician, personally viewed and interpreted this ECG.   Date: 05/18/2023  EKG Time: 9:53  Rate: 125  Rhythm: sinus  Axis: normal  Intervals:normal  ST&T Change: nonspecific t wave bn, no stemi    RADIOLOGY Please see ED Course for my review and interpretation.  I personally reviewed all radiographic images ordered to evaluate for the above acute complaints and reviewed radiology reports and findings.  These findings were personally discussed with the patient.  Please see medical record for radiology report.    PROCEDURES:  Critical Care performed: No  Procedures   MEDICATIONS ORDERED IN ED: Medications  aspirin tablet 325 mg (325 mg Oral Given 05/18/23 1422)  sodium chloride 0.9 % bolus 500 mL (0 mLs Intravenous Stopped 05/18/23 1359)  iohexol (OMNIPAQUE) 350 MG/ML injection 75 mL (50 mLs Intravenous Contrast Given 05/18/23 1120)     IMPRESSION / MDM / ASSESSMENT AND PLAN / ED COURSE  I reviewed the triage vital signs and the nursing notes.                              Differential diagnosis includes, but is not limited to, Asthma, copd, CHF, pna, ptx, malignancy, Pe, anemia  Patient presenting to the ER for evaluation of  symptoms as described above.  Based on symptoms, risk factors and considered above differential, this presenting complaint could reflect a potentially life-threatening illness therefore the patient will be placed on continuous pulse oximetry and telemetry for monitoring.  Laboratory evaluation will be sent to evaluate for the above complaints.  Chest x-ray on my review and interpretation without evidence of dense consolidation.  Given tachycardia symptoms and risk factors will order CTA to further evaluate for PE.  Will give IV fluids.    Clinical Course as of 05/18/23 1430  Thu May 18, 2023  1144 CTA on my review and interpretation without evidence of saddle pulmonary embolism. [PR]  1221 Troponins are flat.  Heart rate now 70.  Will repeat EKG. [PR]  1243 Repeat EKG shows sinus rhythm.  No tachycardia. [PR]  1408 Still awaiting CTA. [PR]  1430 Based on patient's presentation and risk factors I discussed the case in consultation with hospitalist who agreed admit patient for further workup. [PR]    Clinical Course User Index [PR] Willy Eddy, MD     FINAL CLINICAL IMPRESSION(S) / ED DIAGNOSES   Final diagnoses:  Palpitations  Atypical chest pain     Rx / DC Orders   ED Discharge Orders     None        Note:  This document was prepared using Dragon voice recognition software and may include unintentional dictation errors.    Willy Eddy, MD 05/18/23 1430

## 2023-05-18 NOTE — Assessment & Plan Note (Signed)
-   Continue home bronchodilators - DuoNebs as needed

## 2023-05-18 NOTE — ED Notes (Signed)
 Pt taken to CT at this time.

## 2023-05-18 NOTE — Assessment & Plan Note (Addendum)
 Patient was historically on 2 L of supplemental oxygen chronically.  She no longer has required continuous oxygen since starting on Trelegy for her COPD though.  - Continue supplemental oxygen as needed to maintain oxygen saturation above 88%

## 2023-05-19 ENCOUNTER — Observation Stay (HOSPITAL_BASED_OUTPATIENT_CLINIC_OR_DEPARTMENT_OTHER)
Admit: 2023-05-19 | Discharge: 2023-05-19 | Disposition: A | Discharge status: Home / Self Care | Attending: Internal Medicine | Admitting: Internal Medicine

## 2023-05-19 ENCOUNTER — Telehealth: Payer: Self-pay | Admitting: *Deleted

## 2023-05-19 ENCOUNTER — Other Ambulatory Visit (INDEPENDENT_AMBULATORY_CARE_PROVIDER_SITE_OTHER): Payer: Self-pay

## 2023-05-19 DIAGNOSIS — I2489 Other forms of acute ischemic heart disease: Secondary | ICD-10-CM | POA: Diagnosis not present

## 2023-05-19 DIAGNOSIS — R002 Palpitations: Secondary | ICD-10-CM | POA: Diagnosis not present

## 2023-05-19 DIAGNOSIS — I480 Paroxysmal atrial fibrillation: Secondary | ICD-10-CM

## 2023-05-19 LAB — ECHOCARDIOGRAM COMPLETE
AR max vel: 1.55 cm2
AV Area VTI: 1.51 cm2
AV Area mean vel: 1.41 cm2
AV Mean grad: 4 mmHg
AV Peak grad: 7.7 mmHg
Ao pk vel: 1.39 m/s
Area-P 1/2: 2.19 cm2
Calc EF: 70 %
Height: 62 in
MV VTI: 0.86 cm2
S' Lateral: 2.2 cm
Single Plane A2C EF: 68 %
Single Plane A4C EF: 70.5 %
Weight: 1920 [oz_av]

## 2023-05-19 LAB — CBC
HCT: 39 % (ref 36.0–46.0)
Hemoglobin: 13 g/dL (ref 12.0–15.0)
MCH: 29.7 pg (ref 26.0–34.0)
MCHC: 33.3 g/dL (ref 30.0–36.0)
MCV: 89 fL (ref 80.0–100.0)
Platelets: 245 10*3/uL (ref 150–400)
RBC: 4.38 MIL/uL (ref 3.87–5.11)
RDW: 13.9 % (ref 11.5–15.5)
WBC: 10.3 10*3/uL (ref 4.0–10.5)
nRBC: 0 % (ref 0.0–0.2)

## 2023-05-19 LAB — BASIC METABOLIC PANEL
Anion gap: 8 (ref 5–15)
BUN: 26 mg/dL — ABNORMAL HIGH (ref 8–23)
CO2: 27 mmol/L (ref 22–32)
Calcium: 8.9 mg/dL (ref 8.9–10.3)
Chloride: 105 mmol/L (ref 98–111)
Creatinine, Ser: 0.69 mg/dL (ref 0.44–1.00)
GFR, Estimated: >60 mL/min (ref >60)
Glucose, Bld: 102 mg/dL — ABNORMAL HIGH (ref 70–99)
Potassium: 3.5 mmol/L (ref 3.5–5.1)
Sodium: 140 mmol/L (ref 135–145)

## 2023-05-19 MED ORDER — RIVAROXABAN 15 MG PO TABS: 15.0000 mg | ORAL_TABLET | Freq: Every day | ORAL | 1 refills | Status: DC

## 2023-05-19 MED ORDER — METOPROLOL TARTRATE 25 MG PO TABS: 25.0000 mg | ORAL_TABLET | Freq: Two times a day (BID) | ORAL | 3 refills | Status: DC

## 2023-05-19 NOTE — Plan of Care (Signed)

## 2023-05-19 NOTE — Progress Notes (Signed)
*  PRELIMINARY RESULTS* Echocardiogram 2D Echocardiogram has been performed.  Carolyne Fiscal 05/19/2023, 10:41 AM

## 2023-05-19 NOTE — TOC Transition Note (Signed)
 Transition of Care [facility]) - Discharge Note   Patient Details  Name: [name] MRN: [id number] Date of Birth: 1933/05/15  Transition of Care [facility]) CM/SW Contact:  Truddie Hidden, RN Phone Number: 05/19/2023, 1:02 PM   Clinical Narrative:    TOC continuing to follow patient's progress throughout discharge planning.         Patient Goals and CMS Choice            Discharge Placement                       Discharge Plan and Services Additional resources added to the After Visit Summary for                                       Social Drivers of Health (SDOH) Interventions SDOH Screenings   Food Insecurity: No Food Insecurity (05/19/2023)  Housing: Low Risk  (05/19/2023)  Transportation Needs: No Transportation Needs (05/19/2023)  Utilities: Not At Risk (05/19/2023)  Alcohol Screen: Low Risk  (02/17/2021)  Depression (PHQ2-9): Medium Risk (11/05/2021)  Financial Resource Strain: Low Risk  (05/10/2023)   Received from [facility]  Physical Activity: Sufficiently Active (02/17/2021)  Social Connections: Moderately Isolated (05/19/2023)  Stress: No Stress Concern Present (02/17/2021)  Tobacco Use: Low Risk  (05/18/2023)  Recent Concern: Tobacco Use - Medium Risk (05/10/2023)   Received from [facility]     Readmission Risk Interventions     No data to display

## 2023-05-19 NOTE — Progress Notes (Signed)
 AVS reviewed and given to patient. PIV removed. All questions answered.

## 2023-05-19 NOTE — Telephone Encounter (Signed)
 Secure chat received requesting placement of Zio AT for patient to be discharged.

## 2023-05-19 NOTE — Plan of Care (Signed)
  Problem: Education: Goal: Knowledge of General Education information will improve Description: Including pain rating scale, medication(s)/side effects and non-pharmacologic comfort measures 05/19/2023 1330 by Serita Grit, RN Outcome: Completed/Met 05/19/2023 1021 by Serita Grit, RN Outcome: Progressing   Problem: Health Behavior/Discharge Planning: Goal: Ability to manage health-related needs will improve 05/19/2023 1330 by Serita Grit, RN Outcome: Completed/Met 05/19/2023 1021 by Serita Grit, RN Outcome: Progressing   Problem: Clinical Measurements: Goal: Ability to maintain clinical measurements within normal limits will improve 05/19/2023 1330 by Serita Grit, RN Outcome: Completed/Met 05/19/2023 1021 by Serita Grit, RN Outcome: Progressing Goal: Will remain free from infection 05/19/2023 1330 by Serita Grit, RN Outcome: Completed/Met 05/19/2023 1021 by Serita Grit, RN Outcome: Progressing Goal: Diagnostic test results will improve 05/19/2023 1330 by Serita Grit, RN Outcome: Completed/Met 05/19/2023 1021 by Serita Grit, RN Outcome: Progressing Goal: Respiratory complications will improve 05/19/2023 1330 by Serita Grit, RN Outcome: Completed/Met 05/19/2023 1021 by Serita Grit, RN Outcome: Progressing Goal: Cardiovascular complication will be avoided 05/19/2023 1330 by Serita Grit, RN Outcome: Completed/Met 05/19/2023 1021 by Serita Grit, RN Outcome: Progressing   Problem: Activity: Goal: Risk for activity intolerance will decrease 05/19/2023 1330 by Serita Grit, RN Outcome: Completed/Met 05/19/2023 1021 by Serita Grit, RN Outcome: Progressing   Problem: Nutrition: Goal: Adequate nutrition will be maintained 05/19/2023 1330 by Serita Grit, RN Outcome: Completed/Met 05/19/2023 1021 by Serita Grit, RN Outcome: Progressing   Problem: Coping: Goal: Level of anxiety will  decrease 05/19/2023 1330 by Serita Grit, RN Outcome: Completed/Met 05/19/2023 1021 by Serita Grit, RN Outcome: Progressing   Problem: Elimination: Goal: Will not experience complications related to bowel motility 05/19/2023 1330 by Serita Grit, RN Outcome: Completed/Met 05/19/2023 1021 by Serita Grit, RN Outcome: Progressing Goal: Will not experience complications related to urinary retention 05/19/2023 1330 by Serita Grit, RN Outcome: Completed/Met 05/19/2023 1021 by Serita Grit, RN Outcome: Progressing   Problem: Pain Managment: Goal: General experience of comfort will improve and/or be controlled 05/19/2023 1330 by Serita Grit, RN Outcome: Completed/Met 05/19/2023 1021 by Serita Grit, RN Outcome: Progressing   Problem: Safety: Goal: Ability to remain free from injury will improve 05/19/2023 1330 by Serita Grit, RN Outcome: Completed/Met 05/19/2023 1021 by Serita Grit, RN Outcome: Progressing   Problem: Skin Integrity: Goal: Risk for impaired skin integrity will decrease 05/19/2023 1330 by Serita Grit, RN Outcome: Completed/Met 05/19/2023 1021 by Serita Grit, RN Outcome: Progressing

## 2023-05-19 NOTE — Plan of Care (Signed)
  Problem: Clinical Measurements: Goal: Will remain free from infection Outcome: Progressing   Problem: Activity: Goal: Risk for activity intolerance will decrease Outcome: Progressing   Problem: Coping: Goal: Level of anxiety will decrease Outcome: Progressing   Problem: Pain Managment: Goal: General experience of comfort will improve and/or be controlled Outcome: Progressing

## 2023-05-19 NOTE — Discharge Summary (Signed)
 [name] ZOX:[id number] DOB: 12/26/1933 DOA: 05/18/2023  PCP: Danella Penton, MD  Admit date: 05/18/2023 Discharge date: 05/19/2023  Time spent: 35 minutes  Recommendations for Outpatient Follow-up:  Cardiology f/u 2 weeks     Discharge Diagnoses:  Principal Problem:   Palpitations Active Problems:   Demand ischemia (HCC)   (HFpEF) heart failure with preserved ejection fraction (HCC)   Leukocytosis   Essential (primary) hypertension   Stage 2 moderate COPD by GOLD classification (HCC)   AF (paroxysmal atrial fibrillation) (HCC)   Chronic respiratory failure with hypoxia (HCC)   Psoriatic arthritis (HCC)   Discharge Condition: stable  Diet recommendation: heart healthy  Filed Weights   05/18/23 0950  Weight: 54.4 kg    History of present illness:  From admission h and p [name] is a 89 y.o. female with medical history significant of COPD, bronchiectasis, chronic hypoxic respiratory failure on 2 L, atrial fibrillation not on AC, HFpEF, Psoriatic arthritis, mild pulmonary hypertension, hypertension, hyperlipidemia, breast cancer s/p lumpectomy, who presents to the ED due to palpitations.   [name] states that in the last month, she has had 3 episodes of palpitations that have become suddenly, and have been associated with nausea, shortness of breath and bilateral arm heaviness.  No chest pain or diaphoresis.  Typically the episodes last less than an hour and when she takes her oxygen level and heart rate measurements in the morning, they are back to normal.  However, yesterday evening, she had sudden onset symptoms and when she took her vitals this morning, her heart rate was still in the 140s.  Due to this, she came to the ED.  She notes now that her heart rate is back to normal, she feels back to baseline with no palpitations or shortness of breath.   She denies any chest pain, lower extremity swelling, orthopnea.  She denies any recent illness, rhinorrhea, sinus  congestion, cough, vomiting, diarrhea.    Hospital Course:  Patient presents with symptomatic palpitations. Found to be in sinus tach on arrival with elevated bnp. Hemodynamically stable. Symptoms now resolved, ambulating without difficulty. EKG with no significant conduction disturbance. TTE relatively unremarkable for age. Reviewed case with PA Mr. Shea Evans of [facility] cardiology. Plan will be ziopatch (placed prior to discharge) with cardiology f/u in a couple of weeks. Will increase home metoprolol dose (and pause home hydrochlorothiazide) in the time being. We also advised resuming home xarelto given history of a-fib, patient in agreement.   Procedures: none   Consultations: none  Discharge Exam: Vitals:   05/19/23 0842 05/19/23 1210  BP: (!) 148/68 (!) 154/65  Pulse: 64 65  Resp:    Temp: 97.7 F (36.5 C) (!) 97.5 F (36.4 C)  SpO2: 93% 94%    General: NAD Cardiovascular: RRR Respiratory: CTAB Ext: warm  Discharge Instructions   Discharge Instructions     Diet - low sodium heart healthy   Complete by: As directed    Increase activity slowly   Complete by: As directed       Allergies as of 05/19/2023       Reactions   Amoxicillin Nausea Only   Eliquis [apixaban] Nausea Only        Medication List     PAUSE taking these medications    hydrochlorothiazide 12.5 MG capsule Wait to take this until your doctor or other care provider tells you to start again. Commonly known as: MICROZIDE TAKE 1 CAPSULE(12.5 MG) BY MOUTH DAILY  STOP taking these medications    ASPIRIN 81 PO       TAKE these medications    albuterol 108 (90 Base) MCG/ACT inhaler Commonly known as: VENTOLIN HFA Inhale 2 puffs into the lungs every 6 (six) hours as needed for shortness of breath or wheezing.   cetirizine 10 MG tablet Commonly known as: ZYRTEC Take 1 tablet (10 mg total) by mouth daily.   folic acid 1 MG tablet Commonly known as: FOLVITE Take 1 mg by mouth  daily.   magnesium oxide 400 (240 Mg) MG tablet Commonly known as: MAG-OX Take 400 mg by mouth daily.   methotrexate 2.5 MG tablet Commonly known as: RHEUMATREX Take 2.5 mg by mouth once a week. Caution:Chemotherapy. Protect from light.   metoprolol tartrate 25 MG tablet Commonly known as: LOPRESSOR Take 1 tablet (25 mg total) by mouth 2 (two) times daily. What changed: how much to take   omeprazole 40 MG capsule Commonly known as: PRILOSEC Take 40 mg by mouth daily.   Rivaroxaban 15 MG Tabs tablet Commonly known as: XARELTO Take 1 tablet (15 mg total) by mouth daily with supper.   Trelegy Ellipta 100-62.5-25 MCG/ACT Aepb Generic drug: Fluticasone-Umeclidin-Vilant Inhale 1 puff into the lungs daily.       Allergies  Allergen Reactions   Amoxicillin Nausea Only   Eliquis [Apixaban] Nausea Only    Follow-up Information     Furth, Cadence H, PA-C Follow up.   Specialty: Cardiology Why: in about 2 weeks Contact information: [address] [place] [address] [phone]         Danella Penton, MD Follow up.   Specialty: Internal Medicine Contact information: [phone] [facility] [address] [facility] [place] [address] [phone]                  The results of significant diagnostics from this hospitalization (including imaging, microbiology, ancillary and laboratory) are listed below for reference.    Significant Diagnostic Studies: ECHOCARDIOGRAM COMPLETE Result Date: 05/19/2023    ECHOCARDIOGRAM REPORT   Patient Name:   [name] Date of Exam: 05/19/2023 Medical Rec #:  [id number]     Height:       62.0 in Accession #:    [id number]    Weight:       120.0 lb Date of Birth:  [birth year]     BSA:          1.539 m Patient Age:    89 years      BP:           148/68 mmHg Patient Gender: F             HR:           76 bpm. Exam Location:  [facility] Procedure: 2D Echo, Cardiac Doppler, Color Doppler and Strain Analysis (Both             Spectral and Color Flow Doppler were utilized during procedure). Indications:     Palpitations  History:         Patient has prior history of Echocardiogram examinations, most                  recent 06/11/2021. CHF, COPD and Pulmonary HTN, Arrythmias:Atrial                  Fibrillation and PAC; Risk Factors:Hypertension and  Dyslipidemia. Breast CA.  Sonographer:     Mikki Harbor Referring Phys:  [id number] Verdene Lennert Diagnosing Phys: Lorine Bears MD  Sonographer Comments: Image acquisition challenging due to COPD. Global longitudinal strain was attempted. IMPRESSIONS  1. Left ventricular ejection fraction, by estimation, is 60 to 65%. The left ventricle has normal function. The left ventricle has no regional wall motion abnormalities. There is mild left ventricular hypertrophy. Left ventricular diastolic parameters are consistent with Grade II diastolic dysfunction (pseudonormalization).  2. Right ventricular systolic function is normal. The right ventricular size is normal. There is mildly elevated pulmonary artery systolic pressure.  3. Left atrial size was mildly dilated.  4. The mitral valve is normal in structure. No evidence of mitral valve regurgitation. Mild mitral stenosis. The mean mitral valve gradient is 4.0 mmHg. Severe mitral annular calcification.  5. Tricuspid valve regurgitation is mild to moderate.  6. The aortic valve is normal in structure. Aortic valve regurgitation is not visualized. Aortic valve sclerosis is present, with no evidence of aortic valve stenosis.  7. The inferior vena cava is normal in size with greater than 50% respiratory variability, suggesting right atrial pressure of 3 mmHg. FINDINGS  Left Ventricle: Left ventricular ejection fraction, by estimation, is 60 to 65%. The left ventricle has normal function. The left ventricle has no regional wall motion abnormalities. Strain was performed and the global longitudinal strain is indeterminate. Global  longitudinal strain performed but not reported based on interpreter judgement due to suboptimal tracking. The left ventricular internal cavity size was normal in size. There is mild left ventricular hypertrophy. Left ventricular diastolic parameters are consistent with Grade II diastolic dysfunction (pseudonormalization). Right Ventricle: The right ventricular size is normal. No increase in right ventricular wall thickness. Right ventricular systolic function is normal. There is mildly elevated pulmonary artery systolic pressure. The tricuspid regurgitant velocity is 2.81  m/s, and with an assumed right atrial pressure of 3 mmHg, the estimated right ventricular systolic pressure is 34.6 mmHg. Left Atrium: Left atrial size was mildly dilated. Right Atrium: Right atrial size was normal in size. Pericardium: Trivial pericardial effusion is present. Mitral Valve: The mitral valve is normal in structure. Severe mitral annular calcification. No evidence of mitral valve regurgitation. Mild mitral valve stenosis. MV peak gradient, 11.6 mmHg. The mean mitral valve gradient is 4.0 mmHg. Tricuspid Valve: The tricuspid valve is normal in structure. Tricuspid valve regurgitation is mild to moderate. No evidence of tricuspid stenosis. Aortic Valve: The aortic valve is normal in structure. Aortic valve regurgitation is not visualized. Aortic valve sclerosis is present, with no evidence of aortic valve stenosis. Aortic valve mean gradient measures 4.0 mmHg. Aortic valve peak gradient measures 7.7 mmHg. Aortic valve area, by VTI measures 1.51 cm. Pulmonic Valve: The pulmonic valve was normal in structure. Pulmonic valve regurgitation is mild. No evidence of pulmonic stenosis. Aorta: The aortic root is normal in size and structure. Venous: The inferior vena cava is normal in size with greater than 50% respiratory variability, suggesting right atrial pressure of 3 mmHg. IAS/Shunts: No atrial level shunt detected by color flow  Doppler.  LEFT VENTRICLE PLAX 2D LVIDd:         3.70 cm     Diastology LVIDs:         2.20 cm     LV e' medial:    8.62 cm/s LV PW:         1.30 cm     LV E/e' medial:  17.9 LV IVS:  1.20 cm     LV e' lateral:   6.53 cm/s LVOT diam:     1.80 cm     LV E/e' lateral: 23.6 LV SV:         49 LV SV Index:   32 LVOT Area:     2.54 cm  LV Volumes (MOD) LV vol d, MOD A2C: 21.9 ml LV vol d, MOD A4C: 20.5 ml LV vol s, MOD A2C: 7.0 ml LV vol s, MOD A4C: 6.1 ml LV SV MOD A2C:     14.9 ml LV SV MOD A4C:     20.5 ml LV SV MOD BP:      15.5 ml RIGHT VENTRICLE RV Basal diam:  2.80 cm RV Mid diam:    2.80 cm TAPSE (M-mode): 2.5 cm LEFT ATRIUM             Index        RIGHT ATRIUM           Index LA diam:        3.30 cm 2.14 cm/m   RA Area:     13.50 cm LA Vol (A2C):   56.6 ml 36.79 ml/m  RA Volume:   28.70 ml  18.65 ml/m LA Vol (A4C):   55.0 ml 35.75 ml/m LA Biplane Vol: 55.5 ml 36.07 ml/m  AORTIC VALVE                    PULMONIC VALVE AV Area (Vmax):    1.55 cm     PV Vmax:       1.05 m/s AV Area (Vmean):   1.41 cm     PV Peak grad:  4.4 mmHg AV Area (VTI):     1.51 cm AV Vmax:           139.00 cm/s AV Vmean:          92.000 cm/s AV VTI:            0.326 m AV Peak Grad:      7.7 mmHg AV Mean Grad:      4.0 mmHg LVOT Vmax:         84.60 cm/s LVOT Vmean:        50.900 cm/s LVOT VTI:          0.194 m LVOT/AV VTI ratio: 0.60  AORTA Ao Root diam: 2.60 cm MITRAL VALVE                TRICUSPID VALVE MV Area (PHT): 2.19 cm     TR Peak grad:   31.6 mmHg MV Area VTI:   0.86 cm     TR Vmax:        281.00 cm/s MV Peak grad:  11.6 mmHg MV Mean grad:  4.0 mmHg     SHUNTS MV Vmax:       1.70 m/s     Systemic VTI:  0.19 m MV Vmean:      94.6 cm/s    Systemic Diam: 1.80 cm MV Decel Time: 347 msec MV E velocity: 154.00 cm/s MV A velocity: 95.40 cm/s MV E/A ratio:  1.61 Lorine Bears MD Electronically signed by Lorine Bears MD Signature Date/Time: 05/19/2023/12:12:04 PM    Final    CT Angio Chest PE W and/or Wo Contrast Result  Date: 05/18/2023 CLINICAL DATA:  Pulmonary embolism (PE) suspected, high prob. Tachycardia. Weakness, fatigue EXAM: CT ANGIOGRAPHY CHEST WITH CONTRAST TECHNIQUE: Multidetector CT imaging of the chest was performed using  the standard protocol during bolus administration of intravenous contrast. Multiplanar CT image reconstructions and MIPs were obtained to evaluate the vascular anatomy. RADIATION DOSE REDUCTION: This exam was performed according to the departmental dose-optimization program which includes automated exposure control, adjustment of the mA and/or kV according to patient size and/or use of iterative reconstruction technique. CONTRAST:  50mL OMNIPAQUE IOHEXOL 350 MG/ML SOLN COMPARISON:  08/16/2020 FINDINGS: Cardiovascular: No filling defects in the pulmonary arteries to suggest pulmonary emboli. Heart is borderline in size. Coronary artery and aortic calcifications. No evidence of aortic aneurysm. Mediastinum/Nodes: No mediastinal, hilar, or axillary adenopathy. Trachea and esophagus are unremarkable. Thyroid unremarkable. Lungs/Pleura: Biapical scarring. Few small scattered nodules are stable since prior study compatible with benign nodules. Scarring in the lung bases. No confluent acute opacities or effusions. Upper Abdomen: No acute findings Musculoskeletal: Chest wall soft tissues are unremarkable. No acute bony abnormality. Review of the MIP images confirms the above findings. IMPRESSION: No evidence of pulmonary embolus. Coronary artery disease. Biapical and bibasilar scarring. Aortic Atherosclerosis (ICD10-I70.0). Electronically Signed   By: Charlett Nose M.D.   On: 05/18/2023 14:58   DG Chest 2 View Result Date: 05/18/2023 CLINICAL DATA:  Cardiac palpitations. EXAM: CHEST - 2 VIEW COMPARISON:  August 31, 2021. FINDINGS: The heart size and mediastinal contours are within normal limits. Both lungs are clear. Hyperinflation of the lungs is again noted. The visualized skeletal structures are  unremarkable. IMPRESSION: No active cardiopulmonary disease. Electronically Signed   By: Lupita Raider M.D.   On: 05/18/2023 12:34    Microbiology: Recent Results (from the past 240 hours)  Resp panel by RT-PCR (RSV, Flu A&B, Covid) Anterior Nasal Swab     Status: None   Collection Time: 05/18/23 11:05 AM   Specimen: Anterior Nasal Swab  Result Value Ref Range Status   SARS Coronavirus 2 by RT PCR NEGATIVE NEGATIVE Final    Comment: (NOTE) SARS-CoV-2 target nucleic acids are NOT DETECTED.  The SARS-CoV-2 RNA is generally detectable in upper respiratory specimens during the acute phase of infection. The lowest concentration of SARS-CoV-2 viral copies this assay can detect is 138 copies/mL. A negative result does not preclude SARS-Cov-2 infection and should not be used as the sole basis for treatment or other patient management decisions. A negative result may occur with  improper specimen collection/handling, submission of specimen other than nasopharyngeal swab, presence of viral mutation(s) within the areas targeted by this assay, and inadequate number of viral copies(<138 copies/mL). A negative result must be combined with clinical observations, patient history, and epidemiological information. The expected result is Negative.  Fact Sheet for Patients:  BloggerCourse.com  Fact Sheet for Healthcare Providers:  SeriousBroker.it  This test is no t yet approved or cleared by the [place] FDA and  has been authorized for detection and/or diagnosis of SARS-CoV-2 by FDA under an Emergency Use Authorization (EUA). This EUA will remain  in effect (meaning this test can be used) for the duration of the COVID-19 declaration under Section 564(b)(1) of the Act, 21 U.S.C.section 360bbb-3(b)(1), unless the authorization is terminated  or revoked sooner.       Influenza A by PCR NEGATIVE NEGATIVE Final   Influenza B by PCR NEGATIVE  NEGATIVE Final    Comment: (NOTE) The Xpert Xpress SARS-CoV-2/FLU/RSV plus assay is intended as an aid in the diagnosis of influenza from Nasopharyngeal swab specimens and should not be used as a sole basis for treatment. Nasal washings and aspirates are unacceptable for Xpert Xpress SARS-CoV-2/FLU/RSV  testing.  Fact Sheet for Patients: BloggerCourse.com  Fact Sheet for Healthcare Providers: SeriousBroker.it  This test is not yet approved or cleared by the [place] FDA and has been authorized for detection and/or diagnosis of SARS-CoV-2 by FDA under an Emergency Use Authorization (EUA). This EUA will remain in effect (meaning this test can be used) for the duration of the COVID-19 declaration under Section 564(b)(1) of the Act, 21 U.S.C. section 360bbb-3(b)(1), unless the authorization is terminated or revoked.     Resp Syncytial Virus by PCR NEGATIVE NEGATIVE Final    Comment: (NOTE) Fact Sheet for Patients: BloggerCourse.com  Fact Sheet for Healthcare Providers: SeriousBroker.it  This test is not yet approved or cleared by the [place] FDA and has been authorized for detection and/or diagnosis of SARS-CoV-2 by FDA under an Emergency Use Authorization (EUA). This EUA will remain in effect (meaning this test can be used) for the duration of the COVID-19 declaration under Section 564(b)(1) of the Act, 21 U.S.C. section 360bbb-3(b)(1), unless the authorization is terminated or revoked.  Performed at [facility], [address] [place], [place] [address]      Labs: Basic Metabolic Panel: Recent Labs  Lab 05/18/23 0953 05/19/23 0518  NA 139 140  K 3.6 3.5  CL 107 105  CO2 24 27  GLUCOSE 140* 102*  BUN 27* 26*  CREATININE 0.95 0.69  CALCIUM 8.9 8.9   Liver Function Tests: No results for input(s): "AST", "ALT", "ALKPHOS", "BILITOT", "PROT",  "ALBUMIN" in the last 168 hours. No results for input(s): "LIPASE", "AMYLASE" in the last 168 hours. No results for input(s): "AMMONIA" in the last 168 hours. CBC: Recent Labs  Lab 05/18/23 0953 05/19/23 0518  WBC 11.6* 10.3  HGB 14.8 13.0  HCT 44.8 39.0  MCV 91.1 89.0  PLT 259 245   Cardiac Enzymes: No results for input(s): "CKTOTAL", "CKMB", "CKMBINDEX", "TROPONINI" in the last 168 hours. BNP: BNP (last 3 results) Recent Labs    05/18/23 0953  BNP 638.0*    ProBNP (last 3 results) No results for input(s): "PROBNP" in the last 8760 hours.  CBG: No results for input(s): "GLUCAP" in the last 168 hours.     Signed:  Silvano Bilis MD.  [facility] 05/19/2023, 12:30 PM

## 2023-05-19 NOTE — Care Management Obs Status (Signed)
 MEDICARE OBSERVATION STATUS NOTIFICATION   Patient Details  Name: [name] MRN: [id number] Date of Birth: March 18, 1933   Medicare Observation Status Notification Given:   (went to the room to deliver obs letter.  pt has been d/c'd since approx.1:30pm.  still on the tracking board at time of attempted delivery)    Guadalupe Kerekes W, CMA 05/19/2023, 2:55 PM

## 2023-06-01 ENCOUNTER — Telehealth: Payer: Self-pay | Admitting: Cardiology

## 2023-06-01 NOTE — Telephone Encounter (Signed)
 Received call from [facility] about critical alert. Per last discharge summary 05/18/23, the iRhythm was ordered for palpitations with plan for cardiology follow-up in a few weeks. iRhythm now reporting that at 1:23am, patient had ~90 seconds of atrial fibrillation w/ RVR 110-150s bpm, spontaneously resolved. iRhythm called the patient to check for symptoms and got ahold of their partner who reported that the [name] was sleeping comfortably. Reviewed chart, appears to have a history of paroxysmal atrial fibrillation, already on xarelto 15mg  nightly (appropriately renally dosed given last CrCl 49). Will CC her cardiologist Dr. Okey Dupre as an Lorain Childes.  Bella Kennedy, MD Cardiology 06/01/2023

## 2023-06-05 NOTE — Telephone Encounter (Signed)
 Continue current medications and follow-up as scheduled next week.  Yvonne Kendall, MD [facility]

## 2023-06-09 ENCOUNTER — Telehealth: Payer: Self-pay | Admitting: Internal Medicine

## 2023-06-09 NOTE — Telephone Encounter (Signed)
 Received ZIO in fax Indexed and printed - given to [facility]

## 2023-06-09 NOTE — Telephone Encounter (Signed)
 The patient and her husband called. They both said they do not want to schedule an appointment, as she already saw her PCP today.

## 2023-06-09 NOTE — Telephone Encounter (Signed)
 Sherie Don NP reviewed monitor report and requested that she have appointment with her general cardiologist.

## 2023-06-09 NOTE — Telephone Encounter (Signed)
 LMOV to schedule

## 2023-06-09 NOTE — Telephone Encounter (Signed)
 Calling with a critical zio monitor report. Please advise

## 2023-06-09 NOTE — Telephone Encounter (Signed)
 [facility] Cardiology office received critical report on monitor that was ordered by Sherie Don NP, and primary cardiologist is Dr. Cristal Deer End. She requested to email to our office and provided that number. She did not have any further information at this time.

## 2023-06-12 ENCOUNTER — Ambulatory Visit: Admitting: Medical

## 2023-06-12 DIAGNOSIS — I480 Paroxysmal atrial fibrillation: Secondary | ICD-10-CM | POA: Diagnosis not present

## 2023-07-18 ENCOUNTER — Emergency Department

## 2023-07-18 ENCOUNTER — Encounter: Payer: Self-pay | Admitting: Emergency Medicine

## 2023-07-18 ENCOUNTER — Other Ambulatory Visit: Payer: Self-pay

## 2023-07-18 ENCOUNTER — Inpatient Hospital Stay
Admission: EM | Admit: 2023-07-18 | Discharge: 2023-07-21 | DRG: 308 | Disposition: A | Discharge status: Home Health | Attending: Internal Medicine | Admitting: Internal Medicine

## 2023-07-18 DIAGNOSIS — Z923 Personal history of irradiation: Secondary | ICD-10-CM | POA: Diagnosis not present

## 2023-07-18 DIAGNOSIS — J9610 Chronic respiratory failure, unspecified whether with hypoxia or hypercapnia: Secondary | ICD-10-CM | POA: Diagnosis present

## 2023-07-18 DIAGNOSIS — J449 Chronic obstructive pulmonary disease, unspecified: Secondary | ICD-10-CM | POA: Diagnosis present

## 2023-07-18 DIAGNOSIS — Z8701 Personal history of pneumonia (recurrent): Secondary | ICD-10-CM | POA: Diagnosis not present

## 2023-07-18 DIAGNOSIS — E872 Acidosis, unspecified: Secondary | ICD-10-CM | POA: Diagnosis present

## 2023-07-18 DIAGNOSIS — K219 Gastro-esophageal reflux disease without esophagitis: Secondary | ICD-10-CM | POA: Diagnosis present

## 2023-07-18 DIAGNOSIS — R5381 Other malaise: Secondary | ICD-10-CM | POA: Diagnosis present

## 2023-07-18 DIAGNOSIS — I4891 Unspecified atrial fibrillation: Principal | ICD-10-CM | POA: Diagnosis present

## 2023-07-18 DIAGNOSIS — E876 Hypokalemia: Secondary | ICD-10-CM | POA: Diagnosis not present

## 2023-07-18 DIAGNOSIS — E782 Mixed hyperlipidemia: Secondary | ICD-10-CM | POA: Diagnosis present

## 2023-07-18 DIAGNOSIS — Z9221 Personal history of antineoplastic chemotherapy: Secondary | ICD-10-CM | POA: Diagnosis not present

## 2023-07-18 DIAGNOSIS — I10 Essential (primary) hypertension: Secondary | ICD-10-CM | POA: Diagnosis not present

## 2023-07-18 DIAGNOSIS — I480 Paroxysmal atrial fibrillation: Secondary | ICD-10-CM | POA: Diagnosis not present

## 2023-07-18 DIAGNOSIS — I272 Pulmonary hypertension, unspecified: Secondary | ICD-10-CM | POA: Diagnosis present

## 2023-07-18 DIAGNOSIS — Z79631 Long term (current) use of antimetabolite agent: Secondary | ICD-10-CM | POA: Diagnosis not present

## 2023-07-18 DIAGNOSIS — I5033 Acute on chronic diastolic (congestive) heart failure: Secondary | ICD-10-CM | POA: Diagnosis present

## 2023-07-18 DIAGNOSIS — Z7901 Long term (current) use of anticoagulants: Secondary | ICD-10-CM | POA: Diagnosis not present

## 2023-07-18 DIAGNOSIS — Z90711 Acquired absence of uterus with remaining cervical stump: Secondary | ICD-10-CM | POA: Diagnosis not present

## 2023-07-18 DIAGNOSIS — I2489 Other forms of acute ischemic heart disease: Secondary | ICD-10-CM | POA: Diagnosis present

## 2023-07-18 DIAGNOSIS — I4892 Unspecified atrial flutter: Secondary | ICD-10-CM | POA: Diagnosis present

## 2023-07-18 DIAGNOSIS — Z7902 Long term (current) use of antithrombotics/antiplatelets: Secondary | ICD-10-CM | POA: Diagnosis not present

## 2023-07-18 DIAGNOSIS — L4050 Arthropathic psoriasis, unspecified: Secondary | ICD-10-CM | POA: Diagnosis present

## 2023-07-18 DIAGNOSIS — Z853 Personal history of malignant neoplasm of breast: Secondary | ICD-10-CM

## 2023-07-18 DIAGNOSIS — I110 Hypertensive heart disease with heart failure: Secondary | ICD-10-CM | POA: Diagnosis present

## 2023-07-18 DIAGNOSIS — Z79899 Other long term (current) drug therapy: Secondary | ICD-10-CM

## 2023-07-18 LAB — BASIC METABOLIC PANEL WITH GFR
Anion gap: 10 (ref 5–15)
BUN: 34 mg/dL — ABNORMAL HIGH (ref 8–23)
CO2: 25 mmol/L (ref 22–32)
Calcium: 9.1 mg/dL (ref 8.9–10.3)
Chloride: 105 mmol/L (ref 98–111)
Creatinine, Ser: 0.81 mg/dL (ref 0.44–1.00)
GFR, Estimated: >60 mL/min (ref >60)
Glucose, Bld: 131 mg/dL — ABNORMAL HIGH (ref 70–99)
Potassium: 3.5 mmol/L (ref 3.5–5.1)
Sodium: 140 mmol/L (ref 135–145)

## 2023-07-18 LAB — LACTIC ACID, PLASMA
Lactic Acid, Venous: 2 mmol/L (ref 0.5–1.9)
Lactic Acid, Venous: 1.5 mmol/L (ref 0.5–1.9)

## 2023-07-18 LAB — CBC
HCT: 41 % (ref 36.0–46.0)
Hemoglobin: 13.5 g/dL (ref 12.0–15.0)
MCH: 29.5 pg (ref 26.0–34.0)
MCHC: 32.9 g/dL (ref 30.0–36.0)
MCV: 89.7 fL (ref 80.0–100.0)
Platelets: 207 10*3/uL (ref 150–400)
RBC: 4.57 MIL/uL (ref 3.87–5.11)
RDW: 15.3 % (ref 11.5–15.5)
WBC: 17.6 10*3/uL — ABNORMAL HIGH (ref 4.0–10.5)
nRBC: 0 % (ref 0.0–0.2)

## 2023-07-18 LAB — TSH: TSH: 1.96 u[IU]/mL (ref 0.350–4.500)

## 2023-07-18 LAB — TROPONIN I (HIGH SENSITIVITY): Troponin I (High Sensitivity): 19 ng/L — ABNORMAL HIGH (ref <18)

## 2023-07-18 LAB — BRAIN NATRIURETIC PEPTIDE: B Natriuretic Peptide: 451.7 pg/mL — ABNORMAL HIGH (ref 0.0–100.0)

## 2023-07-18 LAB — D-DIMER, QUANTITATIVE: D-Dimer, Quant: 0.92 ug{FEU}/mL — ABNORMAL HIGH (ref 0.00–0.50)

## 2023-07-18 LAB — PROCALCITONIN: Procalcitonin: <0.1 ng/mL

## 2023-07-18 LAB — T4, FREE: Free T4: 1.38 ng/dL — ABNORMAL HIGH (ref 0.61–1.12)

## 2023-07-18 MED ORDER — HYDROCHLOROTHIAZIDE 12.5 MG PO TABS
12.5000 mg | ORAL_TABLET | Freq: Every day | ORAL | Status: DC
  Administered 2023-07-19: 12.5 mg via ORAL
  Filled 2023-07-18: qty 1

## 2023-07-18 MED ORDER — ACETAMINOPHEN 650 MG RE SUPP: 650.0000 mg | Freq: Four times a day (QID) | RECTAL | Status: DC | PRN

## 2023-07-18 MED ORDER — ASPIRIN 81 MG PO TBEC
81.0000 mg | DELAYED_RELEASE_TABLET | Freq: Every day | ORAL | Status: DC
  Administered 2023-07-19 – 2023-07-20 (×2): 81 mg via ORAL
  Filled 2023-07-18 (×2): qty 1

## 2023-07-18 MED ORDER — IOHEXOL 350 MG/ML SOLN
75.0000 mL | Freq: Once | INTRAVENOUS | Status: AC | PRN
  Administered 2023-07-18: 75 mL via INTRAVENOUS

## 2023-07-18 MED ORDER — FOLIC ACID 1 MG PO TABS
1.0000 mg | ORAL_TABLET | Freq: Every day | ORAL | Status: DC
  Administered 2023-07-19 – 2023-07-21 (×3): 1 mg via ORAL
  Filled 2023-07-18 (×3): qty 1

## 2023-07-18 MED ORDER — LORAZEPAM 0.5 MG PO TABS: 0.5000 mg | ORAL_TABLET | Freq: Every day | ORAL | Status: DC | PRN

## 2023-07-18 MED ORDER — DILTIAZEM HCL 25 MG/5ML IV SOLN
10.0000 mg | Freq: Once | INTRAVENOUS | Status: AC
  Administered 2023-07-18: 10 mg via INTRAVENOUS
  Filled 2023-07-18: qty 5

## 2023-07-18 MED ORDER — ENOXAPARIN SODIUM 40 MG/0.4ML IJ SOSY
40.0000 mg | PREFILLED_SYRINGE | INTRAMUSCULAR | Status: DC
  Administered 2023-07-18 – 2023-07-19 (×2): 40 mg via SUBCUTANEOUS
  Filled 2023-07-18 (×2): qty 0.4

## 2023-07-18 MED ORDER — AMIODARONE IV BOLUS ONLY 150 MG/100ML
150.0000 mg | Freq: Once | INTRAVENOUS | Status: AC
  Administered 2023-07-18: 150 mg via INTRAVENOUS
  Filled 2023-07-18: qty 100

## 2023-07-18 MED ORDER — AMIODARONE LOAD VIA INFUSION
150.0000 mg | Freq: Once | INTRAVENOUS | Status: AC
  Administered 2023-07-19: 150 mg via INTRAVENOUS
  Filled 2023-07-18: qty 83.34

## 2023-07-18 MED ORDER — BUDESON-GLYCOPYRROL-FORMOTEROL 160-9-4.8 MCG/ACT IN AERO
2.0000 | INHALATION_SPRAY | Freq: Two times a day (BID) | RESPIRATORY_TRACT | Status: DC
  Administered 2023-07-20 – 2023-07-21 (×3): 2 via RESPIRATORY_TRACT
  Filled 2023-07-18: qty 5.9

## 2023-07-18 MED ORDER — DILTIAZEM HCL-DEXTROSE 125-5 MG/125ML-% IV SOLN (PREMIX)
5.0000 mg/h | INTRAVENOUS | Status: DC
  Administered 2023-07-18: 5 mg/h via INTRAVENOUS
  Administered 2023-07-19: 15 mg/h via INTRAVENOUS
  Administered 2023-07-19 (×2): 5 mg/h via INTRAVENOUS
  Filled 2023-07-18 (×3): qty 125

## 2023-07-18 MED ORDER — AMIODARONE HCL IN DEXTROSE 360-4.14 MG/200ML-% IV SOLN
30.0000 mg/h | INTRAVENOUS | Status: DC
  Administered 2023-07-19 – 2023-07-20 (×3): 30 mg/h via INTRAVENOUS
  Filled 2023-07-18 (×2): qty 200

## 2023-07-18 MED ORDER — AMIODARONE HCL IN DEXTROSE 360-4.14 MG/200ML-% IV SOLN
60.0000 mg/h | INTRAVENOUS | Status: AC
  Administered 2023-07-18 – 2023-07-19 (×2): 60 mg/h via INTRAVENOUS
  Filled 2023-07-18 (×2): qty 200

## 2023-07-18 MED ORDER — ACETAMINOPHEN 325 MG PO TABS: 650.0000 mg | ORAL_TABLET | Freq: Four times a day (QID) | ORAL | Status: DC | PRN

## 2023-07-18 MED ORDER — FUROSEMIDE 10 MG/ML IJ SOLN
40.0000 mg | Freq: Once | INTRAMUSCULAR | Status: AC
  Administered 2023-07-18: 40 mg via INTRAVENOUS
  Filled 2023-07-18: qty 4

## 2023-07-18 NOTE — ED Notes (Signed)
 [place] placed

## 2023-07-18 NOTE — Hospital Course (Signed)
 [name] is a 89 y.o. female with medical history significant of COPD, paroxysmal atrial fibrillation not on anticoagulation, HFpEF, hypertension, hyperlipidemia, breast cancer status postlumpectomy who presents the ED with weakness and shortness of breath.  He was found to have atrial fibrillation with RVR with a heart rate of 161 7 days, she is placed on diltiazem drip, and amiodarone . She also had a BNP of 451, was given 2 dose of IV Lasix .  CT scan negative for PE. Transition to oral Lasix  5/15.

## 2023-07-18 NOTE — H&P (Signed)
 History and Physical    Patient: [name] RUE:[id number] DOB: 1933-12-20 DOA: 07/18/2023 DOS: the patient was seen and examined on 07/18/2023 PCP: Sari Cunning, MD  Patient coming from: Home  Chief Complaint:  Chief Complaint  Patient presents with   Shortness of Breath   HPI: Olivia ATTKISSON is a 89 y.o. female with medical history significant of COPD, paroxysmal atrial fibrillation not on anticoagulation, HFpEF, hypertension, hyperlipidemia, breast cancer status postlumpectomy who presents the ED with weakness and shortness of breath.   Patient states that for the past several weeks she has had progressively worsening shortness of breath and weakness.  Over the past week it worsened to the point where she was only able to ambulate a few feet before needing to rest.  She has had some lower extremity edema which has been present for a few weeks as well and she takes every other day hydrochlorothiazide  for this.  She denies any chest pain or palpitations.  She has had a dry cough for the past 2 weeks but no fever.  She denies dysuria.  Of note, patient was recently treated with Levaquin and prednisone  for CAP and COPD exacerbation.  She also has recent unremarkable TTE.   ED course: In the ED patient was noted to be in A-fib with RVR with rates up to the 160s to 170s.  She remained normotensive.  Labs notable for normal serum creatinine with elevated BUN.  Leukocytosis 17.6 k, negative procalcitonin, elevated BNP 451, mildly elevated troponin 19, elevated D-dimer, mildly elevated lactate.  CTA chest negative for PE, notable for interstitial edema and small bilateral pleural effusions.  She was started on a diltiazem drip which was titrated up to 10 mL/h to minimal effect and given IV Lasix .    Review of Systems: As mentioned in the history of present illness. All other systems reviewed and are negative. Past Medical History:  Diagnosis Date   Acquired thrombophilia (HCC)    Acute  metabolic encephalopathy 06/19/2020   Allergy    Atherosclerosis of aorta (HCC)    Atrial fibrillation (HCC)    Bronchiectasis (HCC)    Cancer (HCC)    left breast cancer    Chronic nausea    COPD (chronic obstructive pulmonary disease) (HCC)    GERD (gastroesophageal reflux disease)    Grade II diastolic dysfunction    Hyperlipemia    Hypertension    Intractable nausea and vomiting 06/20/2018   Mild concentric left ventricular hypertrophy (LVH)    Mild mitral stenosis by prior echocardiogram    Mild pulmonary hypertension (HCC)    Psoriatic arthritis (HCC)    Stage 2 moderate COPD by GOLD classification [facility])    Past Surgical History:  Procedure Laterality Date   BREAST LUMPECTOMY Left 2008   CATARACT EXTRACTION W/PHACO Left 02/13/2023   Procedure: CATARACT EXTRACTION PHACO AND INTRAOCULAR LENS PLACEMENT (IOC) LEFT  KDB GONIOTOMY;  Surgeon: Trudi Fus, MD;  Location: [facility];  Service: Ophthalmology;  Laterality: Left;  10.53 0:55.   CATARACT EXTRACTION W/PHACO Right 02/27/2023   Procedure: CATARACT EXTRACTION PHACO AND INTRAOCULAR LENS PLACEMENT (IOC) RIGHT  [place], goniotomy 15.90 01:28.6;  Surgeon: Trudi Fus, MD;  Location: [facility];  Service: Ophthalmology;  Laterality: Right;   CHOLECYSTECTOMY     HIP PINNING,CANNULATED Left 06/05/2020   Procedure: CANNULATED HIP PINNING;  Surgeon: Molli Angelucci, MD;  Location: [facility] ORS;  Service: Orthopedics;  Laterality: Left;   PARTIAL HYSTERECTOMY     TEE  WITHOUT CARDIOVERSION N/A 06/10/2020   Procedure: TRANSESOPHAGEAL ECHOCARDIOGRAM (TEE);  Surgeon: Constancia Delton, MD;  Location: [facility] ORS;  Service: Cardiovascular;  Laterality: N/A;   Social History:  reports that she has never smoked. She has never used smokeless tobacco. She reports that she does not drink alcohol and does not use drugs.  Allergies  Allergen Reactions   Amoxicillin  Nausea Only   Eliquis  [Apixaban ] Nausea Only     Family History  Problem Relation Age of Onset   Breast cancer Sister     Prior to Admission medications   Medication Sig Start Date End Date Taking? Authorizing Provider  diltiazem (CARDIZEM CD) 180 MG 24 hr capsule Take 180 mg by mouth at bedtime. 06/09/23 06/08/24 Yes [provider]  Fluticasone -Umeclidin-Vilant (TRELEGY ELLIPTA) 100-62.5-25 MCG/ACT AEPB Inhale 1 puff into the lungs daily.   Yes [provider]  folic acid  (FOLVITE ) 1 MG tablet Take 1 mg by mouth daily. Pt takes this med the days she does not take methotrexate  07/13/21  Yes [provider]  LORazepam (ATIVAN) 0.5 MG tablet Take 0.5 mg by mouth in the morning. 06/21/23 09/19/23 Yes [provider]  methotrexate  (RHEUMATREX) 2.5 MG tablet Take 5 mg by mouth once a week. Caution:Chemotherapy. Protect from light. Pt take 2 weekly.   Yes [provider]  metoprolol  tartrate (LOPRESSOR ) 25 MG tablet Take 1 tablet (25 mg total) by mouth 2 (two) times daily. 05/19/23  Yes Wouk, Haynes Lips, MD  omeprazole  (PRILOSEC) 40 MG capsule Take 40 mg by mouth daily.   Yes [provider]  predniSONE  (DELTASONE ) 20 MG tablet Take 20 mg by mouth daily with breakfast. 06/26/23  Yes [provider]  Rivaroxaban  (XARELTO ) 15 MG TABS tablet Take 1 tablet (15 mg total) by mouth daily with supper. 05/19/23  Yes Wouk, Haynes Lips, MD  cefUROXime  (CEFTIN ) 250 MG tablet Take 250 mg by mouth 2 (two) times daily. Patient not taking: Reported on 07/18/2023 06/21/23   [provider]  cetirizine  (ZYRTEC ) 10 MG tablet Take 1 tablet (10 mg total) by mouth daily. Patient not taking: Reported on 07/18/2023 06/11/20   Margery Sheets B, MD  hydrochlorothiazide  (MICROZIDE ) 12.5 MG capsule TAKE 1 CAPSULE(12.5 MG) BY MOUTH DAILY 03/01/22   End, Veryl Gottron, MD  levofloxacin (LEVAQUIN) 250 MG tablet Take 250 mg by mouth daily. Patient not taking: Reported on 07/18/2023 06/26/23   [provider]  magnesium  oxide (MAG-OX) 400 (240 Mg) MG tablet Take 400 mg by mouth daily. Patient not taking: Reported on 07/18/2023    [provider]    Physical Exam: Vitals:   07/18/23 1830 07/18/23 1900 07/18/23 2000 07/18/23 2040  BP:  (!) 147/109 123/75 130/77  Pulse: (!) 142 68 85 (!) 116  Resp: 20 (!) 25 (!) 29 (!) 27  Temp:      TempSrc:      SpO2:  94% 93% 92%  Weight:      Height:       Physical Exam  Constitutional: In no distress.  Cardiovascular: Tachycardic rate, irregularly irregular rhythm. 1+ bilateral lower extremity edema  Pulmonary: Non labored breathing on room air, no wheezing or rales.   Abdominal: Soft. Normal bowel sounds. Non distended and non tender Musculoskeletal: Normal range of motion.     Neurological: Alert and oriented to person, place, and time. Non focal  Skin: Skin is warm and dry.   Data Reviewed:  CTA chest w/ no PE, interstitial edema w/ small bilateral pleural  effusions.   Assessment and Plan:  A-fib with RVR Patient has a history of paroxysmal A-fib.  She is on Cardizem for rate control only.  Per chart review amiodarone  and Xarelto  made patient nauseous.  Unclear what precipitated her RVR.  She has no evidence of a pneumonia on imaging and a negative procalcitonin.  No urinary symptoms to suggest a urinary tract infection.  She does have a leukocytosis, but no fever this could be related to recent steroid use. Plan: Continue diltiazem drip for now, if ineffective will trial amiodarone   Acute on chronic HFpEF TTE 05/2023, EF 60 to 65%, mild LVH, grade 2 LV diastolic dysfunction, TR mild to moderate, mildly elevated PA systolic pressure. Patient with elevated BNP and imaging suggestive of heart failure exacerbation in the setting of A-fib with RVR.  Plan: Redose IV lasix  in the AM  Will hold on repeating TTE for now   COPD Does not appear to be in exacerbation. Plan: Resume equivalent of home inhalers  Elevated lactate Mild,  in the setting of A-fib with RVR.  History of psoriatic arthritis Patient is on methotrexate  and folate.   Hypertension Resume home hydrochlorothiazide    Advance Care Planning:   Code Status: Full Code   Consults: None  Family Communication: Husband is at bedside  Severity of Illness: The appropriate patient status for this patient is INPATIENT. Inpatient status is judged to be reasonable and necessary in order to provide the required intensity of service to ensure the patient's safety. The patient's presenting symptoms, physical exam findings, and initial radiographic and laboratory data in the context of their chronic comorbidities is felt to place them at high risk for further clinical deterioration. Furthermore, it is not anticipated that the patient will be medically stable for discharge from the hospital within 2 midnights of admission.   * I certify that at the point of admission it is my clinical judgment that the patient will require inpatient hospital care spanning beyond 2 midnights from the point of admission due to high intensity of service, high risk for further deterioration and high frequency of surveillance required.*  Author: Joette Mustard, MD 07/18/2023 9:09 PM  For on call review [email or web address].

## 2023-07-18 NOTE — ED Provider Notes (Signed)
 [facility] Provider Note    Event Date/Time   First MD Initiated Contact with Patient 07/18/23 1018     (approximate)   History   Shortness of Breath   HPI  [name] is a 89 y.o. female.  Of COPD, paroxysmal atrial fibrillation, demand ischemia, heart failure with preserved EF.  Chronic respiratory failure on 2 L, pulmonary hypertension according to note by Dr. Eustaquio Hight.  During March hospitalization noted to have a TTE unremarkable for age.  Recent primary care note reports history of recent treatment with Levaquin and prednisone  for resolving community-acquired pneumonia.  Also noted to be taking diltiazem but not interested in Eliquis   Has been having shortness of breath for last few days.  Also noticed swelling in both of her lower feet and ankles       Physical Exam   Triage Vital Signs: ED Triage Vitals  Encounter Vitals Group     BP 07/18/23 0944 (!) 159/111     Systolic BP Percentile --      Diastolic BP Percentile --      Pulse Rate 07/18/23 0944 71     Resp 07/18/23 0944 (!) 22     Temp 07/18/23 0944 (!) 97.5 F (36.4 C)     Temp Source 07/18/23 0944 Oral     SpO2 07/18/23 0944 95 %     Weight 07/18/23 0945 121 lb (54.9 kg)     Height 07/18/23 0945 5\' 3"  (1.6 m)     Head Circumference --      Peak Flow --      Pain Score 07/18/23 0945 0     Pain Loc --      Pain Education --      Exclude from Growth Chart --     Most recent vital signs: Vitals:   07/18/23 1330 07/18/23 1400  BP:  (!) 132/102  Pulse:  (!) 146  Resp:  (!) 26  Temp: 97.9 F (36.6 C)   SpO2:  93%     General: Awake, no distress.  Appears to have mild dyspnea with mild accessory muscle use but in no acute distress CV:  Good peripheral perfusion.  Tachycardia, heart rate approaching 180 with a regular Resp:  Normal effort.  Slight diminishment in the lungs on the lower.  Slight Rales lower lower lungs Abd:  No distention.  Other:  Mild bilateral lower  extremity edema patient reports noticed to be new over the last few days   ED Results / Procedures / Treatments   Labs (all labs ordered are listed, but only abnormal results are displayed) Labs Reviewed  BASIC METABOLIC PANEL WITH GFR - Abnormal; Notable for the following components:      Result Value   Glucose, Bld 131 (*)    BUN 34 (*)    All other components within normal limits  CBC - Abnormal; Notable for the following components:   WBC 17.6 (*)    All other components within normal limits  BRAIN NATRIURETIC PEPTIDE - Abnormal; Notable for the following components:   B Natriuretic Peptide 451.7 (*)    All other components within normal limits  D-DIMER, QUANTITATIVE - Abnormal; Notable for the following components:   D-Dimer, Quant 0.92 (*)    All other components within normal limits  LACTIC ACID, PLASMA - Abnormal; Notable for the following components:   Lactic Acid, Venous 2.0 (*)    All other components within normal limits  TROPONIN I (HIGH  SENSITIVITY) - Abnormal; Notable for the following components:   Troponin I (High Sensitivity) 19 (*)    All other components within normal limits  CULTURE, BLOOD (ROUTINE X 2)  CULTURE, BLOOD (ROUTINE X 2)  PROCALCITONIN  LACTIC ACID, PLASMA     EKG  And interpreted by me at 950 heart rate 170 QRS 70 QTc 490 Probable atrial fibrillation with rapid ventricular response.  No evidence of frank ischemia.  Nonspecific T wave abnormality   RADIOLOGY  CT PE study inter by me is negative for gross embolism  CT Angio Chest PE W and/or Wo Contrast Result Date: 07/18/2023 CLINICAL DATA:  Concern for pulmonary embolism. EXAM: CT ANGIOGRAPHY CHEST WITH CONTRAST TECHNIQUE: Multidetector CT imaging of the chest was performed using the standard protocol during bolus administration of intravenous contrast. Multiplanar CT image reconstructions and MIPs were obtained to evaluate the vascular anatomy. RADIATION DOSE REDUCTION: This exam was  performed according to the departmental dose-optimization program which includes automated exposure control, adjustment of the mA and/or kV according to patient size and/or use of iterative reconstruction technique. CONTRAST:  75mL OMNIPAQUE  IOHEXOL  350 MG/ML SOLN COMPARISON:  Chest CT dated 05/18/2023. FINDINGS: Evaluation of this exam is limited due to respiratory motion. Cardiovascular: There is no cardiomegaly or pericardial effusion. Retrograde flow of contrast from the right atrium into the IVC. Coronary vascular calcification and calcification of the mitral annulus. Moderate atherosclerotic calcification of the thoracic aorta. No aneurysmal dilatation. Evaluation of the pulmonary arteries is limited due to respiratory motion. No central pulmonary artery embolus identified. Mediastinum/Nodes: No hilar adenopathy. Subcarinal lymph nodes measure 12 mm short axis. Small hiatal hernia. The esophagus is grossly unremarkable. No mediastinal fluid collection. Lungs/Pleura: Small bilateral pleural effusions, new since the prior CT. There is diffuse interstitial and interlobular septal prominence with patchy ground-glass densities most consistent with edema. Pneumonia is not excluded. The central airways are patent. Upper Abdomen: No acute abnormality. Musculoskeletal: No acute osseous pathology. Review of the MIP images confirms the above findings. IMPRESSION: 1. No CT evidence of central pulmonary artery embolus. 2. Interstitial edema and small bilateral pleural effusions. Pneumonia is not excluded. 3.  Aortic Atherosclerosis (ICD10-I70.0). Electronically Signed   By: Angus Bark M.D.   On: 07/18/2023 13:25   DG Chest 2 View Result Date: 07/18/2023 CLINICAL DATA:  Shortness of breath.  COPD. EXAM: CHEST - 2 VIEW COMPARISON:  05/18/2023. FINDINGS: Low lung volume. There is new left basilar opacity obscuring the left hemidiaphragm, descending thoracic aorta and blunting the left lateral costophrenic angle,  suggesting combination of left lung atelectasis and/or consolidation with pleural effusion. There are biapical pleural calcifications, similar to the prior study, nonspecific but commonly seen as a sequela of asbestos related disease. Bilateral lung fields are otherwise clear. There is trace right pleural effusion as well blunting the right costophrenic angle. Next Stable cardio-mediastinal silhouette. No acute osseous abnormalities. The soft tissues are within normal limits. IMPRESSION: New left retrocardiac opacity, as described above. Electronically Signed   By: Beula Brunswick M.D.   On: 07/18/2023 10:54      PROCEDURES:  Critical Care performed: Yes, see critical care procedure note(s)  Procedures   MEDICATIONS ORDERED IN ED: Medications  diltiazem (CARDIZEM) 125 mg in dextrose  5% 125 mL (1 mg/mL) infusion (10 mg/hr Intravenous Rate/Dose Change 07/18/23 1352)  diltiazem (CARDIZEM) injection 10 mg (10 mg Intravenous Given 07/18/23 1137)  diltiazem (CARDIZEM) injection 10 mg (10 mg Intravenous Given 07/18/23 1248)  iohexol  (OMNIPAQUE ) 350 MG/ML injection 75  mL (75 mLs Intravenous Contrast Given 07/18/23 1217)  furosemide  (LASIX ) injection 40 mg (40 mg Intravenous Given 07/18/23 1423)     IMPRESSION / MDM / ASSESSMENT AND PLAN / ED COURSE  I reviewed the triage vital signs and the nursing notes.                              Differential diagnosis includes, but is not limited to, CHF COPD volume overload ACS cardiogenic, pneumothorax pneumonia etc.  Patient with A-fib with RVR.  Takes home diltiazem, not currently anticoagulated.  She is notably quite tachycardic with irregularity.  Given diltiazem bolus x 2 with improvement in heart rate to the 120s to 140s.  Initiating diltiazem drip slowly.  Also diuresing.  After further workup review of CT etc. concern for volume overload, CHF or acute cardiogenic source is strongly considered.  No chest pain no findings to be hide suggestive of  ACS.  Patient's presentation is most consistent with acute complicated illness / injury requiring diagnostic workup.   The patient is on the cardiac monitor to evaluate for evidence of arrhythmia and/or significant heart rate changes.  ----------------------------------------- 2:51 PM on 07/18/2023 ----------------------------------------- Consulted with patient accepted to hospitalist by Dr. Lynelle Sara  Currently remains on diltiazem drip.  Heart rate has once again started to increase again to approximately 160s, plan to uptitrate diltiazem drip. BP 152/104        FINAL CLINICAL IMPRESSION(S) / ED DIAGNOSES   Final diagnoses:  Atrial fibrillation with rapid ventricular response (HCC)     Rx / DC Orders   ED Discharge Orders     None        Note:  This document was prepared using Dragon voice recognition software and may include unintentional dictation errors.   Iver Marker, MD 07/18/23 1455

## 2023-07-18 NOTE — ED Notes (Addendum)
 Dr. Jillyn Motto made aware of pt HR still elevated on 10mg /hr of Cardizem

## 2023-07-18 NOTE — ED Triage Notes (Signed)
 Patient to ED via POV for SOB for the past few days. Hx of COPD. Pt reports dyspnea with exertion. Recently taken off her BP meds. Reports intermittent palpitations when laying down- none at this time. States also having dry cough.

## 2023-07-19 ENCOUNTER — Other Ambulatory Visit: Payer: Self-pay

## 2023-07-19 DIAGNOSIS — E876 Hypokalemia: Secondary | ICD-10-CM | POA: Insufficient documentation

## 2023-07-19 DIAGNOSIS — I10 Essential (primary) hypertension: Secondary | ICD-10-CM | POA: Diagnosis not present

## 2023-07-19 DIAGNOSIS — I480 Paroxysmal atrial fibrillation: Secondary | ICD-10-CM | POA: Diagnosis not present

## 2023-07-19 DIAGNOSIS — I5033 Acute on chronic diastolic (congestive) heart failure: Secondary | ICD-10-CM | POA: Diagnosis not present

## 2023-07-19 LAB — CBC WITH DIFFERENTIAL/PLATELET
Abs Immature Granulocytes: 0.16 10*3/uL — ABNORMAL HIGH (ref 0.00–0.07)
Basophils Absolute: 0 10*3/uL (ref 0.0–0.1)
Basophils Relative: 0 %
Eosinophils Absolute: 0.1 10*3/uL (ref 0.0–0.5)
Eosinophils Relative: 1 %
HCT: 34.8 % — ABNORMAL LOW (ref 36.0–46.0)
Hemoglobin: 11.6 g/dL — ABNORMAL LOW (ref 12.0–15.0)
Immature Granulocytes: 1 %
Lymphocytes Relative: 9 %
Lymphs Abs: 1.3 10*3/uL (ref 0.7–4.0)
MCH: 29.6 pg (ref 26.0–34.0)
MCHC: 33.3 g/dL (ref 30.0–36.0)
MCV: 88.8 fL (ref 80.0–100.0)
Monocytes Absolute: 0.7 10*3/uL (ref 0.1–1.0)
Monocytes Relative: 5 %
Neutro Abs: 12.1 10*3/uL — ABNORMAL HIGH (ref 1.7–7.7)
Neutrophils Relative %: 84 %
Platelets: 165 10*3/uL (ref 150–400)
RBC: 3.92 MIL/uL (ref 3.87–5.11)
RDW: 15.1 % (ref 11.5–15.5)
WBC: 14.4 10*3/uL — ABNORMAL HIGH (ref 4.0–10.5)
nRBC: 0 % (ref 0.0–0.2)

## 2023-07-19 LAB — COMPREHENSIVE METABOLIC PANEL WITH GFR
ALT: 79 U/L — ABNORMAL HIGH (ref 0–44)
AST: 25 U/L (ref 15–41)
Albumin: 3.1 g/dL — ABNORMAL LOW (ref 3.5–5.0)
Alkaline Phosphatase: 99 U/L (ref 38–126)
Anion gap: 9 (ref 5–15)
BUN: 32 mg/dL — ABNORMAL HIGH (ref 8–23)
CO2: 26 mmol/L (ref 22–32)
Calcium: 8.4 mg/dL — ABNORMAL LOW (ref 8.9–10.3)
Chloride: 103 mmol/L (ref 98–111)
Creatinine, Ser: 0.69 mg/dL (ref 0.44–1.00)
GFR, Estimated: >60 mL/min (ref >60)
Glucose, Bld: 169 mg/dL — ABNORMAL HIGH (ref 70–99)
Potassium: 2.9 mmol/L — ABNORMAL LOW (ref 3.5–5.1)
Sodium: 138 mmol/L (ref 135–145)
Total Bilirubin: 0.7 mg/dL (ref 0.0–1.2)
Total Protein: 6.3 g/dL — ABNORMAL LOW (ref 6.5–8.1)

## 2023-07-19 LAB — MAGNESIUM: Magnesium: 2 mg/dL (ref 1.7–2.4)

## 2023-07-19 MED ORDER — POTASSIUM CHLORIDE 10 MEQ/100ML IV SOLN
10.0000 meq | Freq: Once | INTRAVENOUS | Status: AC
  Administered 2023-07-19: 10 meq via INTRAVENOUS
  Filled 2023-07-19: qty 100

## 2023-07-19 MED ORDER — METOPROLOL TARTRATE 25 MG PO TABS
25.0000 mg | ORAL_TABLET | Freq: Two times a day (BID) | ORAL | Status: DC
  Administered 2023-07-19 – 2023-07-20 (×3): 25 mg via ORAL
  Filled 2023-07-19 (×3): qty 1

## 2023-07-19 MED ORDER — FUROSEMIDE 10 MG/ML IJ SOLN
40.0000 mg | Freq: Once | INTRAMUSCULAR | Status: AC
  Administered 2023-07-19: 40 mg via INTRAVENOUS
  Filled 2023-07-19: qty 4

## 2023-07-19 MED ORDER — POTASSIUM CHLORIDE CRYS ER 20 MEQ PO TBCR
40.0000 meq | EXTENDED_RELEASE_TABLET | ORAL | Status: AC
  Administered 2023-07-19 (×2): 40 meq via ORAL
  Filled 2023-07-19 (×2): qty 2

## 2023-07-19 MED ORDER — SENNOSIDES-DOCUSATE SODIUM 8.6-50 MG PO TABS
2.0000 | ORAL_TABLET | Freq: Two times a day (BID) | ORAL | Status: DC
  Administered 2023-07-20 – 2023-07-21 (×3): 2 via ORAL
  Filled 2023-07-19 (×3): qty 2

## 2023-07-19 MED ORDER — ONDANSETRON HCL 4 MG/2ML IJ SOLN
4.0000 mg | Freq: Four times a day (QID) | INTRAMUSCULAR | Status: DC | PRN
  Administered 2023-07-19 (×2): 4 mg via INTRAVENOUS
  Filled 2023-07-19 (×3): qty 2

## 2023-07-19 MED ORDER — PANTOPRAZOLE SODIUM 40 MG PO TBEC
40.0000 mg | DELAYED_RELEASE_TABLET | Freq: Every day | ORAL | Status: DC
  Administered 2023-07-19 – 2023-07-21 (×3): 40 mg via ORAL
  Filled 2023-07-19 (×3): qty 1

## 2023-07-19 NOTE — ED Notes (Signed)
Informed RN bed assigned 

## 2023-07-19 NOTE — ED Notes (Signed)
 Patient in NSR, will repeat EKG and notify Dr. Jeane Miguel.

## 2023-07-19 NOTE — ED Notes (Signed)
 Per Dr. Jeane Miguel, titrate patient off cardizem and keep on amio.

## 2023-07-19 NOTE — ED Notes (Signed)
 Patient complaining of nausea, MD Jeane Miguel made aware and requested order for Zofran .

## 2023-07-19 NOTE — Progress Notes (Signed)
 PT Cancellation Note  Patient Details Name: KIMARIE GONZALO MRN: [id number] DOB: [birth year]   Cancelled Treatment:    Reason Eval/Treat Not Completed: Medical issues which prohibited therapy Chart reviewed, tachycardia remains an issues.  Spoke with nurse, she reports despite 2 drips HR remains uncontrolled and during our conversation had short lived spike up to 140-150 range.  Nurse asks to hold for now, will maintain on caseload and follow from a distance until cardiovascularly stable.   Darice Edelman, DPT 07/19/2023, 9:57 AM

## 2023-07-19 NOTE — ED Notes (Signed)
 Patient complaining of shortness of breath, placed on 2L [place] for comfort and see if it improves symptoms.

## 2023-07-19 NOTE — Progress Notes (Signed)
  Progress Note   Patient: [name] WGN:[id number] DOB: [birth year] DOA: 07/18/2023     1 DOS: the patient was seen and examined on 07/19/2023   Brief hospital course: ANNYSSA MOFFIT is a 89 y.o. female with medical history significant of COPD, paroxysmal atrial fibrillation not on anticoagulation, HFpEF, hypertension, hyperlipidemia, breast cancer status postlumpectomy who presents the ED with weakness and shortness of breath.  He was found to have atrial fibrillation with RVR with a heart rate of 161 7 days, she is placed on diltiazem drip, and amiodarone . She also had a BNP of 451, was given 1 dose of IV Lasix .  CT scan negative for PE.   Principal Problem:   Atrial fibrillation (HCC) Active Problems:   Essential (primary) hypertension   Stage 2 moderate COPD by GOLD classification (HCC)   Paroxysmal atrial fibrillation with RVR (HCC)   Acute on chronic diastolic CHF (congestive heart failure) (HCC)   Hypokalemia   Assessment and Plan: Paroxysmal atrial fibrillation with RVR. Patient has recurrent atrial fibrillation, not able to tolerate anticoagulation. Patient was given amiodarone  as well as diltiazem drip.  Currently converted to sinus rhythm.  Discontinue diltiazem.  Continue oral metoprolol . Give another day of amiodarone  drip.  Likely transition to oral tomorrow.  Acute on chronic diastolic congestive heart failure. TTE 05/2023, EF 60 to 65%, mild LVH, grade 2 LV diastolic dysfunction, TR mild to moderate, mildly elevated PA systolic pressure.  Patient had a significant worsening short of breath for the last 3 days, she has significant elevation of BNP.  She received IV Lasix  yesterday, she diuresing well, already feeling better.  Will give another dose of IV Lasix .  Will monitor patient closely, to decide if patient need more Lasix .  COPD. Appear to be stable.  Patient states that she was not on oxygen  at home.  Essential hypertension. Continue to follow, discontinue  hydrochlorothiazide  as the patient was receiving Lasix .      Subjective:  Still feels short of breath with exertion.  No cough.  Physical Exam: Vitals:   07/19/23 1130 07/19/23 1200 07/19/23 1245 07/19/23 1300  BP: (!) 129/92 118/88  136/78  Pulse: 60  68 65  Resp: (!) 22 (!) 24 (!) 24 (!) 28  Temp:      TempSrc:      SpO2: 98% 96% 95% 93%  Weight:      Height:       General exam: Appears calm and comfortable  Respiratory system: Some crackles in the bases.Aaron Aas Respiratory effort normal. Cardiovascular system: S1 & S2 heard, RRR. No JVD, murmurs, rubs, gallops or clicks. No pedal edema. Gastrointestinal system: Abdomen is nondistended, soft and nontender. No organomegaly or masses felt. Normal bowel sounds heard. Central nervous system: Alert and oriented x3. No focal neurological deficits. Extremities: Symmetric 5 x 5 power. Skin: No rashes, lesions or ulcers Psychiatry: Judgement and insight appear normal. Mood & affect appropriate.    Data Reviewed:  Chest x-ray and lab results reviewed.  Family Communication: Husband and daughter updated at bedside.  Disposition: Status is: Inpatient Remains inpatient appropriate because: Severity of disease, IV treatment.     Time spent: 50 minutes  Author: Donaciano Frizzle, MD 07/19/2023 1:55 PM  For on call review [email or web address].

## 2023-07-20 ENCOUNTER — Other Ambulatory Visit (HOSPITAL_COMMUNITY): Payer: Self-pay

## 2023-07-20 DIAGNOSIS — J449 Chronic obstructive pulmonary disease, unspecified: Secondary | ICD-10-CM

## 2023-07-20 DIAGNOSIS — I10 Essential (primary) hypertension: Secondary | ICD-10-CM | POA: Diagnosis not present

## 2023-07-20 DIAGNOSIS — I480 Paroxysmal atrial fibrillation: Secondary | ICD-10-CM | POA: Diagnosis not present

## 2023-07-20 DIAGNOSIS — I5033 Acute on chronic diastolic (congestive) heart failure: Secondary | ICD-10-CM | POA: Diagnosis not present

## 2023-07-20 LAB — BASIC METABOLIC PANEL WITH GFR
Anion gap: 12 (ref 5–15)
BUN: 36 mg/dL — ABNORMAL HIGH (ref 8–23)
CO2: 24 mmol/L (ref 22–32)
Calcium: 8.7 mg/dL — ABNORMAL LOW (ref 8.9–10.3)
Chloride: 102 mmol/L (ref 98–111)
Creatinine, Ser: 0.76 mg/dL (ref 0.44–1.00)
GFR, Estimated: >60 mL/min (ref >60)
Glucose, Bld: 111 mg/dL — ABNORMAL HIGH (ref 70–99)
Potassium: 4.1 mmol/L (ref 3.5–5.1)
Sodium: 138 mmol/L (ref 135–145)

## 2023-07-20 LAB — MAGNESIUM: Magnesium: 2.1 mg/dL (ref 1.7–2.4)

## 2023-07-20 LAB — PHOSPHORUS: Phosphorus: 3.4 mg/dL (ref 2.5–4.6)

## 2023-07-20 MED ORDER — AMIODARONE HCL 200 MG PO TABS
400.0000 mg | ORAL_TABLET | Freq: Two times a day (BID) | ORAL | Status: DC
  Administered 2023-07-20 – 2023-07-21 (×3): 400 mg via ORAL
  Filled 2023-07-20 (×3): qty 2

## 2023-07-20 MED ORDER — METOPROLOL TARTRATE 25 MG PO TABS
37.5000 mg | ORAL_TABLET | Freq: Two times a day (BID) | ORAL | Status: DC
  Administered 2023-07-20 – 2023-07-21 (×2): 37.5 mg via ORAL
  Filled 2023-07-20 (×2): qty 2

## 2023-07-20 MED ORDER — FUROSEMIDE 40 MG PO TABS
40.0000 mg | ORAL_TABLET | Freq: Every day | ORAL | Status: DC
  Administered 2023-07-20 – 2023-07-21 (×2): 40 mg via ORAL
  Filled 2023-07-20 (×2): qty 1

## 2023-07-20 MED ORDER — DABIGATRAN ETEXILATE MESYLATE 150 MG PO CAPS
150.0000 mg | ORAL_CAPSULE | Freq: Two times a day (BID) | ORAL | Status: DC
  Administered 2023-07-20 – 2023-07-21 (×3): 150 mg via ORAL
  Filled 2023-07-20 (×4): qty 1

## 2023-07-20 MED ORDER — AMIODARONE HCL 200 MG PO TABS: 200.0000 mg | ORAL_TABLET | Freq: Every day | ORAL | Status: DC

## 2023-07-20 MED ORDER — AMIODARONE HCL 200 MG PO TABS: 200.0000 mg | ORAL_TABLET | Freq: Two times a day (BID) | ORAL | Status: DC

## 2023-07-20 NOTE — Plan of Care (Signed)
  Problem: Education: Goal: Knowledge of General Education information will improve Description: Including pain rating scale, medication(s)/side effects and non-pharmacologic comfort measures Outcome: Progressing   Problem: Clinical Measurements: Goal: Ability to maintain clinical measurements within normal limits will improve Outcome: Progressing   Problem: Clinical Measurements: Goal: Will remain free from infection Outcome: Progressing   Problem: Clinical Measurements: Goal: Diagnostic test results will improve Outcome: Progressing   Problem: Activity: Goal: Risk for activity intolerance will decrease Outcome: Progressing   Problem: Coping: Goal: Level of anxiety will decrease Outcome: Progressing   Problem: Safety: Goal: Ability to remain free from injury will improve Outcome: Progressing   Plan of care ongoing, see MAR, see flowsheet

## 2023-07-20 NOTE — Evaluation (Signed)
 Physical Therapy Evaluation Patient Details Name: [name] MRN: [id number] DOB: 06-Oct-1933 Today's Date: 07/20/2023  History of Present Illness  Pt is an 89 y.o. female presenting to hospital 07/18/23 with c/o SOB.  Pt admitted with a-fib with RVR, acute on chronic HFpEF.  PMH includes COPD, paroxysmal a-fib, demand ischemia, HF with preserved EF, chronic respiratory failure on 2L, pulmonary htn.  Clinical Impression  Prior to recent medical concerns, pt reports being modified independent ambulating with SPC; lives with her husband.  Currently pt is min assist with bed mobility; min assist progressing to CGA with transfers; and CGA ambulating up to 120 feet with RW use (pt appeared more steady ambulating with RW compared to [facility] use).  Trialed pt on room air (pt reports not using supplemental O2 at home).  SpO2 sats 92% at rest on room air beginning of session; SpO2 sats 90% or greater on room air with sessions activities; SpO2 sats 91% at rest end of session on room air (pt placed back on 2 L supplemental O2 to increased O2 92% or greater end of session).  HR 74-85 bpm during sessions activities.  Pt would currently benefit from skilled PT to address noted impairments and functional limitations (see below for any additional details).  Upon hospital discharge, pt would benefit from ongoing therapy.     If plan is discharge home, recommend the following: A little help with walking and/or transfers;A little help with bathing/dressing/bathroom;Assistance with cooking/housework;Assist for transportation;Help with stairs or ramp for entrance   Can travel by private vehicle        Equipment Recommendations Rolling walker (2 wheels) (pt has RW at home already)  Recommendations for Other Services       Functional Status Assessment Patient has had a recent decline in their functional status and demonstrates the ability to make significant improvements in function in a reasonable and predictable amount  of time.     Precautions / Restrictions Precautions Precautions: Fall Recall of Precautions/Restrictions: Intact Restrictions Weight Bearing Restrictions Per Provider Order: No      Mobility  Bed Mobility Overal bed mobility: Needs Assistance Bed Mobility: Supine to Sit, Sit to Supine     Supine to sit: Min assist, HOB elevated (assist to scoot to edge of bed) Sit to supine: Min assist, HOB elevated (assist for B LE's)        Transfers Overall transfer level: Needs assistance Equipment used: Rolling walker (2 wheels) Transfers: Sit to/from Stand Sit to Stand: Min assist, Contact guard assist           General transfer comment: vc's for UE placement; min assist to stand from bed; CGA to stand from toilet (using grab bar)    Ambulation/Gait Ambulation/Gait assistance: Contact guard assist Gait Distance (Feet):  (15 feet with SPC; 120 feet with RW) Assistive device: Straight cane, Rolling walker (2 wheels) Gait Pattern/deviations: Step-through pattern, Decreased step length - right, Decreased step length - left Gait velocity: decreased     General Gait Details: pt appearing unsteady ambulating with SPC so switched to [employer]            Wheelchair Mobility     Tilt Bed    Modified Rankin (Stroke Patients Only)       Balance Overall balance assessment: Needs assistance Sitting-balance support: No upper extremity supported, Feet supported Sitting balance-Leahy Scale: Good Sitting balance - Comments: steady reaching within BOS   Standing balance support: Single extremity supported Standing balance-Leahy Scale: Fair Standing  balance comment: pt requiring at least single UE support for static standing balance                             Pertinent Vitals/Pain Pain Assessment Pain Assessment: No/denies pain HR 74-85 bpm during sessions activities.    Home Living Family/patient expects to be discharged to:: Private residence Living  Arrangements: Spouse/significant other Available Help at Discharge: Family;Available 24 hours/day Type of Home: House Home Access: Stairs to enter Entrance Stairs-Rails: Right;Left;Can reach both Entrance Stairs-Number of Steps: 5   Home Layout: One level;Other (Comment) (bonus room (does not need to access)) Home Equipment: Grab bars - toilet;Grab bars - tub/shower;Cane - single Librarian, academic (2 wheels);Transport chair      Prior Function Prior Level of Function : Independent/Modified Independent             Mobility Comments: Modified independent ambulating with SPC (to help with balance).  No recent falls reported.  Takes breathing treatment in morning to help with respiratory status.       Extremity/Trunk Assessment   Upper Extremity Assessment Upper Extremity Assessment: Generalized weakness    Lower Extremity Assessment Lower Extremity Assessment: Generalized weakness    Cervical / Trunk Assessment Cervical / Trunk Assessment: Normal  Communication   Communication Communication: No apparent difficulties (pt did appear soft spoken)    Cognition Arousal: Alert Behavior During Therapy: WFL for tasks assessed/performed   PT - Cognitive impairments: No apparent impairments                         Following commands: Intact       Cueing Cueing Techniques: Verbal cues     General Comments  Nursing cleared pt for participation in physical therapy.  Pt agreeable to PT session.    Exercises     Assessment/Plan    PT Assessment Patient needs continued PT services  PT Problem List Decreased strength;Decreased activity tolerance;Decreased balance;Decreased mobility;Decreased knowledge of use of DME;Decreased knowledge of precautions;Cardiopulmonary status limiting activity       PT Treatment Interventions DME instruction;Gait training;Stair training;Functional mobility training;Therapeutic activities;Therapeutic exercise;Balance  training;Patient/family education    PT Goals (Current goals can be found in the Care Plan section)  Acute Rehab PT Goals Patient Stated Goal: to improve strength and walking PT Goal Formulation: With patient Time For Goal Achievement: 08/03/23 Potential to Achieve Goals: Good    Frequency Min 2X/week     Co-evaluation               AM-PAC PT "6 Clicks" Mobility  Outcome Measure Help needed turning from your back to your side while in a flat bed without using bedrails?: None Help needed moving from lying on your back to sitting on the side of a flat bed without using bedrails?: A Little Help needed moving to and from a bed to a chair (including a wheelchair)?: A Little Help needed standing up from a chair using your arms (e.g., wheelchair or bedside chair)?: A Little Help needed to walk in hospital room?: A Little Help needed climbing 3-5 steps with a railing? : A Little 6 Click Score: 19    End of Session Equipment Utilized During Treatment: Gait belt Activity Tolerance: Patient tolerated treatment well Patient left: in bed;with call bell/phone within reach;with bed alarm set;with family/visitor present Nurse Communication: Mobility status;Precautions;Other (comment) (pt doesn't use oxygen  at baseline) PT Visit Diagnosis: Unsteadiness on feet (R26.81);Other  abnormalities of gait and mobility (R26.89);Muscle weakness (generalized) (M62.81)    Time: 5621-3086 PT Time Calculation (min) (ACUTE ONLY): 38 min   Charges:   PT Evaluation $PT Eval Low Complexity: 1 Low PT Treatments $Gait Training: 8-22 mins $Therapeutic Activity: 8-22 mins PT General Charges $$ ACUTE PT VISIT: 1 Visit        Amador Junes, PT 07/20/23, 6:14 PM

## 2023-07-20 NOTE — Consult Note (Addendum)
 Cardiology Consultation   Patient ID: YOMARA GRAPE MRN: [id number]; DOB: 11/01/1933  Admit date: 07/18/2023 Date of Consult: 07/20/2023  PCP:  Sari Cunning, MD   [facility] Providers Cardiologist:  Sammy Crisp, MD        Patient Profile:   [name] is a 89 y.o. female with a hx of paroxysmal atrial fibrillation, primary hypertension, GERD, COPD on 2 L of oxygen  at night, hyperlipidemia, history of breast cancer status post chemoradiation, chronic HFpEF, who is being seen 07/20/2023 for the evaluation of paroxysmal atrial fibrillation and chronic HFpEF at the request of Dr Jeane Miguel.  History of Present Illness:   [name] had originally started being followed by cardiology in 06/2020 after she had presented to the [facility] emergency department status post fall and was found to have a left hip fracture.  1/5 send new onset atrial fibrillation a flutter with RVR thought precipitated by the left hip fracture.  Echocardiogram revealed an LVEF of 65 to 70%, and no RWMA, mild LVH, G2 DD, RVSP 49.5 mmHg/pulmonary hypertension, mild LAE, mitral valve was noted to be degenerative with mild mitral stenosis estimated RAP 3 mmHg.  Over the years she has been treated with several different medications and for various GI symptoms medications have been stopped.  She has unable to tolerate apixaban , rivaroxaban , not interested in warfarin, amiodarone , and was ultimately started on Pradaxa .  She had been tolerating Pradaxa  okay when she was last seen in 2023.  Since then she has been lost to follow-up with cardiology.  She was admitted to [facility] 3/13-20/14/2025 with complaint of palpitations.  She stated she had 3 episodes of palpitations that had began suddenly.  She said they always come on suddenly and have been associate with nausea, shortness of breath and bilateral arm heaviness.  No chest pain or diaphoresis.  Typically episodes last less than an hour and when she takes her oxygen  level and  heart rate measurements in the morning they are back to normal.  However yesterday evening she had sudden onset of symptoms prior to her arrival.  She was found to be sinus tach on arrival with an elevated BNP but otherwise was hemodynamically stable.  Symptoms had resolved and she was ambulated without difficulty.  EKG with no significant conduction disturbance.  TTE relatively unremarkable.  She was planned to wear a ZIO monitor on discharge and follow-up with cardiology in a few weeks.  Her home metoprolol  was also increased.  She was discharged on rivaroxaban  15 mg daily that she did not resume taking.  She presented to the [facility] emergency department 07/18/2023 for shortness of breath over the past few days.  She reported dyspnea with exertion and stated she was recently taken off of her blood pressure medications.  She reported intermittent palpitations when lying down.  She also stated that she continued to have a dry cough.  She also stated she was having some swelling to both of her bilateral lower extremities.  Initial vitals: Blood pressure 159/111, pulse of 71, respirations of 22, temperature of 97.5  Pertinent labs: Blood glucose 131, BUN 34, WBCs of 17.6, BNP 451.7, D-dimer 0.92, lactic acid of 2, high-sensitivity troponin of 19  Imaging: Chest x-ray revealed a new left retrocardiac opacity; CTA of the chest rule out PE revealed no PE, interstitial edema and small bilateral pleural effusions, pneumonia not excluded, aortic atherosclerosis  Medications administered in the emergency department: Diltiazem drip, diltiazem bolus 10 mg IVP x 2, and  furosemide  40 mg IVP  Patient has been in the hospital since the 13th cardiology consulted today for atrial fibrillation and chronic HFpEF.  During her hospitalization she converted to sinus rhythm and diltiazem drip was discontinued.  She remained on amiodarone  infusion per family request until seen by cardiology.  Past Medical History:  Diagnosis  Date   Acquired thrombophilia (HCC)    Acute metabolic encephalopathy 06/19/2020   Allergy    Atherosclerosis of aorta (HCC)    Atrial fibrillation (HCC)    Bronchiectasis (HCC)    Cancer (HCC)    left breast cancer    Chronic nausea    COPD (chronic obstructive pulmonary disease) (HCC)    GERD (gastroesophageal reflux disease)    Grade II diastolic dysfunction    Hyperlipemia    Hypertension    Intractable nausea and vomiting 06/20/2018   Mild concentric left ventricular hypertrophy (LVH)    Mild mitral stenosis by prior echocardiogram    Mild pulmonary hypertension (HCC)    Psoriatic arthritis (HCC)    Stage 2 moderate COPD by GOLD classification [facility])     Past Surgical History:  Procedure Laterality Date   BREAST LUMPECTOMY Left 2008   CATARACT EXTRACTION W/PHACO Left 02/13/2023   Procedure: CATARACT EXTRACTION PHACO AND INTRAOCULAR LENS PLACEMENT (IOC) LEFT  KDB GONIOTOMY;  Surgeon: Trudi Fus, MD;  Location: [facility];  Service: Ophthalmology;  Laterality: Left;  10.53 0:55.   CATARACT EXTRACTION W/PHACO Right 02/27/2023   Procedure: CATARACT EXTRACTION PHACO AND INTRAOCULAR LENS PLACEMENT (IOC) RIGHT  [place], goniotomy 15.90 01:28.6;  Surgeon: Trudi Fus, MD;  Location: [facility];  Service: Ophthalmology;  Laterality: Right;   CHOLECYSTECTOMY     HIP PINNING,CANNULATED Left 06/05/2020   Procedure: CANNULATED HIP PINNING;  Surgeon: Molli Angelucci, MD;  Location: [facility];  Service: Orthopedics;  Laterality: Left;   PARTIAL HYSTERECTOMY     TEE WITHOUT CARDIOVERSION N/A 06/10/2020   Procedure: TRANSESOPHAGEAL ECHOCARDIOGRAM (TEE);  Surgeon: Constancia Delton, MD;  Location: [facility];  Service: Cardiovascular;  Laterality: N/A;     Home Medications:  Prior to Admission medications   Medication Sig Start Date End Date Taking? Authorizing Provider  diltiazem (CARDIZEM CD) 180 MG 24 hr capsule Take 180 mg by mouth at bedtime. 06/09/23  06/08/24 Yes [provider]  Fluticasone -Umeclidin-Vilant (TRELEGY ELLIPTA) 100-62.5-25 MCG/ACT AEPB Inhale 1 puff into the lungs daily.   Yes [provider]  folic acid  (FOLVITE ) 1 MG tablet Take 1 mg by mouth daily. Pt takes this med the days she does not take methotrexate  07/13/21  Yes [provider]  LORazepam (ATIVAN) 0.5 MG tablet Take 0.5 mg by mouth in the morning. 06/21/23 09/19/23 Yes [provider]  methotrexate  (RHEUMATREX) 2.5 MG tablet Take 5 mg by mouth once a week. Caution:Chemotherapy. Protect from light. Pt take 2 weekly.   Yes [provider]  metoprolol  tartrate (LOPRESSOR ) 25 MG tablet Take 1 tablet (25 mg total) by mouth 2 (two) times daily. 05/19/23  Yes Wouk, Haynes Lips, MD  omeprazole  (PRILOSEC) 40 MG capsule Take 40 mg by mouth daily.   Yes [provider]  predniSONE  (DELTASONE ) 20 MG tablet Take 20 mg by mouth daily with breakfast. 06/26/23  Yes [provider]  Rivaroxaban  (XARELTO ) 15 MG TABS tablet Take 1 tablet (15 mg total) by mouth daily with supper. 05/19/23  Yes Wouk, Haynes Lips, MD  cefUROXime  (CEFTIN ) 250 MG tablet Take 250 mg by mouth 2 (two) times daily.  Patient not taking: Reported on 07/18/2023 06/21/23   [provider]  cetirizine  (ZYRTEC ) 10 MG tablet Take 1 tablet (10 mg total) by mouth daily. Patient not taking: Reported on 07/18/2023 06/11/20   Margery Sheets B, MD  hydrochlorothiazide  (MICROZIDE ) 12.5 MG capsule TAKE 1 CAPSULE(12.5 MG) BY MOUTH DAILY 03/01/22   End, Veryl Gottron, MD  levofloxacin (LEVAQUIN) 250 MG tablet Take 250 mg by mouth daily. Patient not taking: Reported on 07/18/2023 06/26/23   [provider]  magnesium  oxide (MAG-OX) 400 (240 Mg) MG tablet Take 400 mg by mouth daily. Patient not taking: Reported on 07/18/2023    [provider]    Inpatient Medications: Scheduled Meds:  amiodarone   400 mg Oral BID   Followed by   Cecily Cohen ON 07/27/2023]  amiodarone   200 mg Oral BID   Followed by   Cecily Cohen ON 08/03/2023] amiodarone   200 mg Oral Daily   budesonide-glycopyrrolate -formoterol  2 puff Inhalation BID   dabigatran   150 mg Oral Q12H   folic acid   1 mg Oral Daily   furosemide   40 mg Oral Daily   metoprolol  tartrate  25 mg Oral BID   pantoprazole   40 mg Oral Daily   senna-docusate  2 tablet Oral BID   Continuous Infusions:   PRN Meds: acetaminophen  **OR** acetaminophen , LORazepam, ondansetron  (ZOFRAN ) IV  Allergies:    Allergies  Allergen Reactions   Amoxicillin  Nausea Only   Eliquis  [Apixaban ] Nausea Only    Social History:   Social History   Socioeconomic History   Marital status: Married    Spouse name: Not on file   Number of children: 2   Years of education: Not on file   Highest education level: Not on file  Occupational History   Not on file  Tobacco Use   Smoking status: Never   Smokeless tobacco: Never  Vaping Use   Vaping status: Never Used  Substance and Sexual Activity   Alcohol use: No   Drug use: No   Sexual activity: Not on file  Other Topics Concern   Not on file  Social History Narrative   Not on file   Social Drivers of Health   Financial Resource Strain: Low Risk  (05/10/2023)   Received from [facility]   Overall Financial Resource Strain (CARDIA)    Difficulty of Paying Living Expenses: Not hard at all  Food Insecurity: No Food Insecurity (07/18/2023)   Hunger Vital Sign    Worried About Running Out of Food in the Last Year: Never true    Ran Out of Food in the Last Year: Never true  Transportation Needs: No Transportation Needs (07/18/2023)   PRAPARE - Administrator, Civil Service (Medical): No    Lack of Transportation (Non-Medical): No  Physical Activity: Sufficiently Active (02/17/2021)   Exercise Vital Sign    Days of Exercise per Week: 7 days    Minutes of Exercise per Session: 30 min  Stress: No Stress Concern Present (02/17/2021)   [employer] of Occupational Health - Occupational Stress Questionnaire    Feeling of Stress : Not at all  Social Connections: Moderately Isolated (07/18/2023)   Social Connection and Isolation Panel [NHANES]    Frequency of Communication with Friends and Family: More than three times a week    Frequency of Social Gatherings with Friends and Family: More than three times a week    Attends Religious Services: Never    Database administrator or Organizations: No  Attends Banker Meetings: Never    Marital Status: Married  Catering manager Violence: Not At Risk (07/18/2023)   Humiliation, Afraid, Rape, and Kick questionnaire    Fear of Current or Ex-Partner: No    Emotionally Abused: No    Physically Abused: No    Sexually Abused: No    Family History:    Family History  Problem Relation Age of Onset   Breast cancer Sister      ROS:  Please see the history of present illness.  Review of Systems  Constitutional:  Positive for malaise/fatigue.  Respiratory:  Positive for shortness of breath.   Cardiovascular:  Positive for palpitations and leg swelling.  Neurological:  Positive for weakness.    All other ROS reviewed and negative.     Physical Exam/Data:   Vitals:   07/20/23 0517 07/20/23 0620 07/20/23 0724 07/20/23 1118  BP:  131/72 118/74 124/62  Pulse:  72 72 78  Resp:  18 18 17   Temp:   98.9 F (37.2 C) 97.9 F (36.6 C)  TempSrc:      SpO2:  94% 97% 97%  Weight: 54.7 kg     Height:        Intake/Output Summary (Last 24 hours) at 07/20/2023 1236 Last data filed at 07/20/2023 0700 Gross per 24 hour  Intake 458.46 ml  Output 400 ml  Net 58.46 ml      07/20/2023    5:17 AM 07/19/2023    7:45 PM 07/18/2023    9:45 AM  Last 3 Weights  Weight (lbs) 120 lb 9.5 oz 113 lb 12.1 oz 121 lb  Weight (kg) 54.7 kg 51.6 kg 54.885 kg     Body mass index is 22.79 kg/m.  General:  Well nourished, well developed, in no acute distress HEENT: normal Neck: no  JVD Vascular: No carotid bruits; Distal pulses 2+ bilaterally Cardiac:  normal S1, S2; RRR; no murmur  Lungs:  clear to auscultation bilaterally, no wheezing, rhonchi or rales, respirations are unlabored at rest on 2 L of O2 via nasal cannula Abd: soft, nontender, no hepatomegaly  Ext: no edema Musculoskeletal:  No deformities, BUE and BLE strength normal and equal Skin: warm and dry  Neuro:  CNs 2-12 intact, no focal abnormalities noted Psych:  Normal affect   EKG:  The EKG was personally reviewed and demonstrates: On 07/18/2023 revealed atrial fibrillation with a rate of 168, nonspecific ST and T wave changes Telemetry:  Telemetry was personally reviewed and demonstrates: Sinus rhythm with rates of 70 and 80  Relevant CV Studies: 2D echo 05/19/2023 1. Left ventricular ejection fraction, by estimation, is 60 to 65%. The  left ventricle has normal function. The left ventricle has no regional  wall motion abnormalities. There is mild left ventricular hypertrophy.  Left ventricular diastolic parameters  are consistent with Grade II diastolic dysfunction (pseudonormalization).   2. Right ventricular systolic function is normal. The right ventricular  size is normal. There is mildly elevated pulmonary artery systolic  pressure.   3. Left atrial size was mildly dilated.   4. The mitral valve is normal in structure. No evidence of mitral valve  regurgitation. Mild mitral stenosis. The mean mitral valve gradient is 4.0  mmHg. Severe mitral annular calcification.   5. Tricuspid valve regurgitation is mild to moderate.   6. The aortic valve is normal in structure. Aortic valve regurgitation is  not visualized. Aortic valve sclerosis is present, with no evidence of  aortic valve stenosis.  7. The inferior vena cava is normal in size with greater than 50%  respiratory variability, suggesting right atrial pressure of 3 mmHg.    Laboratory Data:  High Sensitivity Troponin:   Recent Labs   Lab 07/18/23 1109  TROPONINIHS 19*     Chemistry Recent Labs  Lab 07/18/23 1109 07/19/23 0509 07/20/23 0433  NA 140 138 138  K 3.5 2.9* 4.1  CL 105 103 102  CO2 25 26 24   GLUCOSE 131* 169* 111*  BUN 34* 32* 36*  CREATININE 0.81 0.69 0.76  CALCIUM  9.1 8.4* 8.7*  MG  --  2.0 2.1  GFRNONAA >60 >60 >60  ANIONGAP 10 9 12     Recent Labs  Lab 07/19/23 0509  PROT 6.3*  ALBUMIN 3.1*  AST 25  ALT 79*  ALKPHOS 99  BILITOT 0.7   Lipids No results for input(s): "CHOL", "TRIG", "HDL", "LABVLDL", "LDLCALC", "CHOLHDL" in the last 168 hours.  Hematology Recent Labs  Lab 07/18/23 1109 07/19/23 0509  WBC 17.6* 14.4*  RBC 4.57 3.92  HGB 13.5 11.6*  HCT 41.0 34.8*  MCV 89.7 88.8  MCH 29.5 29.6  MCHC 32.9 33.3  RDW 15.3 15.1  PLT 207 165   Thyroid   Recent Labs  Lab 07/18/23 1109  TSH 1.960  FREET4 1.38*    BNP Recent Labs  Lab 07/18/23 1109  BNP 451.7*    DDimer  Recent Labs  Lab 07/18/23 1109  DDIMER 0.92*     Radiology/Studies:  CT Angio Chest PE W and/or Wo Contrast Result Date: 07/18/2023 CLINICAL DATA:  Concern for pulmonary embolism. EXAM: CT ANGIOGRAPHY CHEST WITH CONTRAST TECHNIQUE: Multidetector CT imaging of the chest was performed using the standard protocol during bolus administration of intravenous contrast. Multiplanar CT image reconstructions and MIPs were obtained to evaluate the vascular anatomy. RADIATION DOSE REDUCTION: This exam was performed according to the departmental dose-optimization program which includes automated exposure control, adjustment of the mA and/or kV according to patient size and/or use of iterative reconstruction technique. CONTRAST:  75mL OMNIPAQUE  IOHEXOL  350 MG/ML SOLN COMPARISON:  Chest CT dated 05/18/2023. FINDINGS: Evaluation of this exam is limited due to respiratory motion. Cardiovascular: There is no cardiomegaly or pericardial effusion. Retrograde flow of contrast from the right atrium into the IVC. Coronary  vascular calcification and calcification of the mitral annulus. Moderate atherosclerotic calcification of the thoracic aorta. No aneurysmal dilatation. Evaluation of the pulmonary arteries is limited due to respiratory motion. No central pulmonary artery embolus identified. Mediastinum/Nodes: No hilar adenopathy. Subcarinal lymph nodes measure 12 mm short axis. Small hiatal hernia. The esophagus is grossly unremarkable. No mediastinal fluid collection. Lungs/Pleura: Small bilateral pleural effusions, new since the prior CT. There is diffuse interstitial and interlobular septal prominence with patchy ground-glass densities most consistent with edema. Pneumonia is not excluded. The central airways are patent. Upper Abdomen: No acute abnormality. Musculoskeletal: No acute osseous pathology. Review of the MIP images confirms the above findings. IMPRESSION: 1. No CT evidence of central pulmonary artery embolus. 2. Interstitial edema and small bilateral pleural effusions. Pneumonia is not excluded. 3.  Aortic Atherosclerosis (ICD10-I70.0). Electronically Signed   By: Angus Bark M.D.   On: 07/18/2023 13:25   DG Chest 2 View Result Date: 07/18/2023 CLINICAL DATA:  Shortness of breath.  COPD. EXAM: CHEST - 2 VIEW COMPARISON:  05/18/2023. FINDINGS: Low lung volume. There is new left basilar opacity obscuring the left hemidiaphragm, descending thoracic aorta and blunting the left lateral costophrenic angle, suggesting combination of left lung  atelectasis and/or consolidation with pleural effusion. There are biapical pleural calcifications, similar to the prior study, nonspecific but commonly seen as a sequela of asbestos related disease. Bilateral lung fields are otherwise clear. There is trace right pleural effusion as well blunting the right costophrenic angle. Next Stable cardio-mediastinal silhouette. No acute osseous abnormalities. The soft tissues are within normal limits. IMPRESSION: New left retrocardiac  opacity, as described above. Electronically Signed   By: Beula Brunswick M.D.   On: 07/18/2023 10:54     Assessment and Plan:   Atrial fibrillation with RVR -Presented with A-fib RVR -Was initially placed on diltiazem drip and given amiodarone  as well converted to sinus rhythm -Converted to sinus rhythm on 07/19/2023 at 12:36 PM per telemetry review -Once conversion to sinus rhythm diltiazem drip was discontinued -Amiodarone  infusion discontinued and she was transition to amiodarone  oral dosing 400 mg twice daily x 7 days, then 200 mg twice daily x 7 days, followed by 200 mg daily -She was also started back on Pradaxa  150 mg twice daily as she stated previously she has not tolerated OAC due to GI side effects but continued conversation she has had GI issues prior to any of the medications so she and her husband are agreeable to try Pradaxa  for her CHA2DS2-VASc score of at least 5 for stroke prophylaxis -Aspirin  and Lovenox  were discontinued -Continue telemetry monitoring -TSH 1.60 and free T4 1.38 - Continue home metoprolol  25 mg twice daily - PTA diltiazem 180 mg daily and currently remains on hold   Acute on chronic HFpEF -Presented with shortness of breath -Elevated D-dimer -CTA of the chest negative for PE -Last echocardiogram completed in March 2025 with an LVEF of 60 to 65%, mild LVH, G2 DD, mild to moderate TR, and mildly elevated PA systolic pressure -Slight elevation in BNP - Previously had been on IV furosemide  x 2 doses and then transition to oral dosing -NYHA class II symptoms -Appears to be euvolemic on exam -Daily weights, I's and O's  COPD -Not currently in exacerbation -Continued on inhalers -Continue with oxygen  therapy  Primary hypertension -Blood pressure 124/62 - Continued on furosemide  40 mg daily metoprolol  tartrate 25 mg twice daily -PTA oral diltiazem remains on hold -Vital signs per unit protocol  Debility -Patient has not been out of bed since arrival  to the hospital -PT evaluation and treatment    Risk Assessment/Risk Scores:        New [place]  Heart Association (NYHA) Functional Class NYHA Class II  CHA2DS2-VASc Score = 5   This indicates a 7.2% annual risk of stroke. The patient's score is based upon: CHF History: 1 HTN History: 1 Diabetes History: 0 Stroke History: 0 Vascular Disease History: 0 Age Score: 2 Gender Score: 1         For questions or updates, please contact [facility] Please consult [email or web address] for contact info under    Signed, Jailynn Lavalais, NP  07/20/2023 12:36 PM

## 2023-07-20 NOTE — TOC Benefit Eligibility Note (Signed)
 Pharmacy Patient Advocate Encounter  Insurance verification completed.    The patient is insured through [place]. Patient has Medicare and is not eligible for a copay card, but may be able to apply for patient assistance or Medicare RX Payment Plan (Patient Must reach out to their plan, if eligible for payment plan), if available.    Ran test claim for dabigatran  150mg  (Pradaxa ) and the current 30 day co-pay is $10.   This test claim was processed through [facility]- copay amounts may vary at other pharmacies due to [employer], or as the patient moves through the different stages of their insurance plan.

## 2023-07-20 NOTE — Care Management Important Message (Signed)
 Important Message  Patient Details  Name: [name] MRN: [id number] Date of Birth: [birth year]   Important Message Given:  Yes - Medicare IM     Anise Kerns 07/20/2023, 12:59 PM

## 2023-07-20 NOTE — Progress Notes (Signed)
  Progress Note   Patient: [name] OZH:[id number] DOB: March 04, 1934 DOA: 07/18/2023     2 DOS: the patient was seen and examined on 07/20/2023   Brief hospital course: HARLEE ABERNATHEY is a 89 y.o. female with medical history significant of COPD, paroxysmal atrial fibrillation not on anticoagulation, HFpEF, hypertension, hyperlipidemia, breast cancer status postlumpectomy who presents the ED with weakness and shortness of breath.  He was found to have atrial fibrillation with RVR with a heart rate of 161 7 days, she is placed on diltiazem drip, and amiodarone . She also had a BNP of 451, was given 2 dose of IV Lasix .  CT scan negative for PE. Transition to oral Lasix  5/15.   Principal Problem:   Atrial fibrillation (HCC) Active Problems:   Essential (primary) hypertension   Stage 2 moderate COPD by GOLD classification (HCC)   Paroxysmal atrial fibrillation with RVR (HCC)   Acute on chronic diastolic CHF (congestive heart failure) (HCC)   Hypokalemia   Assessment and Plan: Paroxysmal atrial fibrillation with RVR. Patient has recurrent atrial fibrillation, not able to tolerate anticoagulation. Patient was given amiodarone  as well as diltiazem drip.  Currently converted to sinus rhythm.  Discontinued diltiazem.  Continued oral metoprolol . She is on amiodarone  drip, per family request, will obtain cardiology consult.   Acute on chronic diastolic congestive heart failure. TTE 05/2023, EF 60 to 65%, mild LVH, grade 2 LV diastolic dysfunction, TR mild to moderate, mildly elevated PA systolic pressure.  Patient had a significant worsening short of breath for the last 3 days, she has significant elevation of BNP.  She received daily IV Lasix  x 2, transition to oral Lasix  today.  Volume status better.   COPD. Appear to be stable.  Patient states that she was not on oxygen  at home.   Essential hypertension. Continue to follow, discontinued hydrochlorothiazide  as the patient was receiving  Lasix .   Hypokalemia Resolved.  Debility. Obtain PT eval, patient and husband prefer to go home with home care.      Subjective:  Patient no longer has any short of breath today.  No cough.  Physical Exam: Vitals:   07/20/23 0403 07/20/23 0517 07/20/23 0620 07/20/23 0724  BP: 120/75  131/72 118/74  Pulse: 72  72 72  Resp: 18  18 18   Temp: (!) 97.5 F (36.4 C)   98.9 F (37.2 C)  TempSrc:      SpO2: 95%  94% 97%  Weight:  54.7 kg    Height:       General exam: Appears calm and comfortable  Respiratory system: Clear to auscultation. Respiratory effort normal. Cardiovascular system: S1 & S2 heard, RRR. No JVD, murmurs, rubs, gallops or clicks. No pedal edema. Gastrointestinal system: Abdomen is nondistended, soft and nontender. No organomegaly or masses felt. Normal bowel sounds heard. Central nervous system: Alert and oriented x2. No focal neurological deficits. Extremities: Symmetric 5 x 5 power. Skin: No rashes, lesions or ulcers Psychiatry: Judgement and insight appear normal. Mood & affect appropriate.    Data Reviewed:  Lab results reviewed.  Family Communication: Husband updated at bedside.  Disposition: Status is: Inpatient Remains inpatient appropriate because: Severity of disease,     Time spent: 35 minutes  Author: Donaciano Frizzle, MD 07/20/2023 10:50 AM  For on call review [email or web address].

## 2023-07-21 ENCOUNTER — Other Ambulatory Visit: Payer: Self-pay

## 2023-07-21 DIAGNOSIS — I4891 Unspecified atrial fibrillation: Secondary | ICD-10-CM

## 2023-07-21 DIAGNOSIS — I5033 Acute on chronic diastolic (congestive) heart failure: Secondary | ICD-10-CM | POA: Diagnosis not present

## 2023-07-21 DIAGNOSIS — I10 Essential (primary) hypertension: Secondary | ICD-10-CM | POA: Diagnosis not present

## 2023-07-21 DIAGNOSIS — I480 Paroxysmal atrial fibrillation: Secondary | ICD-10-CM | POA: Diagnosis not present

## 2023-07-21 MED ORDER — METOPROLOL TARTRATE 25 MG PO TABS
37.5000 mg | ORAL_TABLET | Freq: Two times a day (BID) | ORAL | 0 refills | Status: DC
  Filled 2023-07-21: qty 20, 7d supply, fill #0

## 2023-07-21 MED ORDER — DABIGATRAN ETEXILATE MESYLATE 150 MG PO CAPS
150.0000 mg | ORAL_CAPSULE | Freq: Two times a day (BID) | ORAL | 0 refills | Status: AC
  Filled 2023-07-21: qty 60, 30d supply, fill #0

## 2023-07-21 MED ORDER — AMIODARONE HCL 200 MG PO TABS
ORAL_TABLET | ORAL | 0 refills | Status: DC
  Filled 2023-07-21: qty 72, 44d supply, fill #0

## 2023-07-21 MED ORDER — FUROSEMIDE 40 MG PO TABS
40.0000 mg | ORAL_TABLET | Freq: Every day | ORAL | 0 refills | Status: DC
  Filled 2023-07-21: qty 30, 30d supply, fill #0

## 2023-07-21 NOTE — Progress Notes (Signed)
 Physical Therapy Treatment Patient Details Name: [name] MRN: [id number] DOB: 07-23-1933 Today's Date: 07/21/2023   History of Present Illness Pt is an 89 y.o. female presenting to hospital 07/18/23 with c/o SOB.  Pt admitted with a-fib with RVR, acute on chronic HFpEF.  PMH includes COPD, paroxysmal a-fib, demand ischemia, HF with preserved EF, chronic respiratory failure, pulmonary htn.    PT Comments  Pt resting in bed upon PT arrival; pt agreeable to therapy; pt's daughter and husband present.  During session pt was modified independent with bed mobility; CGA with transfers; and CGA with ambulation using RW (utilized hand hold assist walking to bathroom for toileting per pt request).  Pt reports plan to discharge home today and has no questions/concerns for therapist.    If plan is discharge home, recommend the following: A little help with walking and/or transfers;A little help with bathing/dressing/bathroom;Assistance with cooking/housework;Assist for transportation;Help with stairs or ramp for entrance   Can travel by private vehicle      Yes  Equipment Recommendations  Rolling walker (2 wheels) (pt has RW at home already)    Recommendations for Other Services       Precautions / Restrictions Precautions Precautions: Fall Recall of Precautions/Restrictions: Intact Restrictions Weight Bearing Restrictions Per Provider Order: No     Mobility  Bed Mobility   Bed Mobility: Supine to Sit, Sit to Supine     Supine to sit: Modified independent (Device/Increase time), HOB elevated Sit to supine: Modified independent (Device/Increase time), HOB elevated   General bed mobility comments: mild increased effort to perform on own    Transfers Overall transfer level: Needs assistance Equipment used: 1 person hand held assist, Rolling walker (2 wheels) Transfers: Sit to/from Stand Sit to Stand: Contact guard assist           General transfer comment: x1 trial standing  from bed (hand hold assist) and x1 trial standing from toilet (RW use)    Ambulation/Gait Ambulation/Gait assistance: Contact guard assist, Min assist (CGA with RW; min assist/hand hold assist) Gait Distance (Feet):  (15 feet x2 (to/from bathroom) with hand hold assist; 60 feet with RW) Assistive device: 1 person hand held assist, Rolling walker (2 wheels) Gait Pattern/deviations: Step-through pattern, Decreased step length - right, Decreased step length - left Gait velocity: decreased     General Gait Details: pt requesting hand hold assist to walk to bathroom (pt's husband had been walking to/from bathroom with hand hold assist); steady ambulation with RW use (CGA)   Stairs             Wheelchair Mobility     Tilt Bed    Modified Rankin (Stroke Patients Only)       Balance Overall balance assessment: Needs assistance Sitting-balance support: No upper extremity supported, Feet supported Sitting balance-Leahy Scale: Good Sitting balance - Comments: steady reaching within BOS   Standing balance support: No upper extremity supported Standing balance-Leahy Scale: Good Standing balance comment: steady managing underwear in standing for toileting                            Communication Communication Communication: No apparent difficulties (soft spoken)  Cognition Arousal: Alert Behavior During Therapy: WFL for tasks assessed/performed   PT - Cognitive impairments: No apparent impairments                         Following commands: Intact  Cueing Cueing Techniques: Verbal cues  Exercises      General Comments  Nursing cleared pt for participation in physical therapy.  Pt agreeable to PT session.      Pertinent Vitals/Pain Pain Assessment Pain Assessment: No/denies pain Pt's SpO2 sats at least 90% on room air beginning/end of session at rest. HR stable during session.    Home Living                          Prior  Function            PT Goals (current goals can now be found in the care plan section) Acute Rehab PT Goals Patient Stated Goal: to improve strength and walking PT Goal Formulation: With patient Time For Goal Achievement: 08/03/23 Potential to Achieve Goals: Good Progress towards PT goals: Progressing toward goals    Frequency    Min 2X/week      PT Plan      Co-evaluation              AM-PAC PT "6 Clicks" Mobility   Outcome Measure  Help needed turning from your back to your side while in a flat bed without using bedrails?: None Help needed moving from lying on your back to sitting on the side of a flat bed without using bedrails?: None Help needed moving to and from a bed to a chair (including a wheelchair)?: A Little Help needed standing up from a chair using your arms (e.g., wheelchair or bedside chair)?: A Little Help needed to walk in hospital room?: A Little Help needed climbing 3-5 steps with a railing? : A Little 6 Click Score: 20    End of Session Equipment Utilized During Treatment: Gait belt Activity Tolerance: Patient tolerated treatment well Patient left: in bed;with call bell/phone within reach;with bed alarm set;with family/visitor present Nurse Communication: Mobility status;Precautions PT Visit Diagnosis: Unsteadiness on feet (R26.81);Other abnormalities of gait and mobility (R26.89);Muscle weakness (generalized) (M62.81)     Time: 0981-1914 PT Time Calculation (min) (ACUTE ONLY): 12 min  Charges:    $Therapeutic Activity: 8-22 mins PT General Charges $$ ACUTE PT VISIT: 1 Visit                     Amador Junes, PT 07/21/23, 4:22 PM

## 2023-07-21 NOTE — TOC Progression Note (Signed)
 Transition of Care [facility]) - Progression Note    Patient Details  Name: [name] MRN: [id number] Date of Birth: Jul 16, 1933  Transition of [facility]) CM/SW Contact  Zoe Hinds, RN Phone Number: 07/21/2023, 4:27 PM  Clinical Narrative:    Pt medically cleared to dc per MD order . This CM spoke with  pt and her husband regarding HH recommendation and confirmed no preference . This CM coordinated HH PT services with [facility].Pt's husband confirmed he will provide dc transportation & that pt has DME at home including RW & travel chair  .      Barriers to Discharge: No Barriers Identified  Expected Discharge Plan and Services         Expected Discharge Date: 07/21/23                         HH Arranged: PT HH Agency: [facility] Date [facility] Agency Contacted: 07/21/23 Time HH Agency Contacted: [phone]     Social Determinants of Health (SDOH) Interventions SDOH Screenings   Food Insecurity: No Food Insecurity (07/18/2023)  Housing: Low Risk  (07/18/2023)  Transportation Needs: No Transportation Needs (07/18/2023)  Utilities: Not At Risk (07/18/2023)  Alcohol Screen: Low Risk  (02/17/2021)  Depression (PHQ2-9): Medium Risk (11/05/2021)  Financial Resource Strain: Low Risk  (05/10/2023)   Received from [facility]  Physical Activity: Sufficiently Active (02/17/2021)  Social Connections: Moderately Isolated (07/18/2023)  Stress: No Stress Concern Present (02/17/2021)  Tobacco Use: Low Risk  (07/18/2023)  Recent Concern: Tobacco Use - Medium Risk (06/26/2023)   Received from [facility]    Readmission Risk Interventions     No data to display

## 2023-07-21 NOTE — Discharge Summary (Signed)
 Physician Discharge Summary   Patient: [name] MRN: [id number] DOB: 06-29-1933  Admit date:     07/18/2023  Discharge date: 07/21/23  Discharge Physician: Donaciano Frizzle   PCP: Sari Cunning, MD   Recommendations at discharge:   Follow-up with PCP in 1 week. Follow-up with cardiology as scheduled. Check a BMP at next office visit.  Discharge Diagnoses: Principal Problem:   Atrial fibrillation (HCC) Active Problems:   Essential (primary) hypertension   Stage 2 moderate COPD by GOLD classification (HCC)   Atrial fibrillation with rapid ventricular response (HCC)   Paroxysmal atrial fibrillation with RVR (HCC)   Acute on chronic diastolic CHF (congestive heart failure) (HCC)   Hypokalemia  Resolved Problems:   * No resolved hospital problems. *  Hospital Course: [name] is a 89 y.o. female with medical history significant of COPD, paroxysmal atrial fibrillation not on anticoagulation, HFpEF, hypertension, hyperlipidemia, breast cancer status postlumpectomy who presents the ED with weakness and shortness of breath.  He was found to have atrial fibrillation with RVR with a heart rate of 161 7 days, she is placed on diltiazem drip, and amiodarone . She also had a BNP of 451, was given 2 dose of IV Lasix .  CT scan negative for PE. Transition to oral Lasix  5/15. Patient condition had improved, currently in sinus, seen by cardiology, cleared for discharge.  Will continue amiodarone , Lasix .  Also started Pradaxa . Assessment and Plan: Paroxysmal atrial fibrillation with RVR. Patient has recurrent atrial fibrillation, not able to tolerate anticoagulation. Patient was given amiodarone  as well as diltiazem drip.  Currently converted to sinus rhythm.  Discontinued diltiazem.  Continued oral metoprolol . Per cardiology recommendation, increase dose of metoprolol , continue tapering dose of amiodarone .  Also started on Pradaxa  as patient has not been tolerating Xarelto .   Acute on  chronic diastolic congestive heart failure. TTE 05/2023, EF 60 to 65%, mild LVH, grade 2 LV diastolic dysfunction, TR mild to moderate, mildly elevated PA systolic pressure.  Patient had a significant worsening short of breath for the last 3 days, she has significant elevation of BNP.  She received daily IV Lasix  x 2, transition to oral Lasix .  Condition has improved.   COPD. Appear to be stable.  Home O2 eval does not see any hypoxemia.   Essential hypertension. Continue to follow, discontinued hydrochlorothiazide  as the patient was receiving Lasix .   Hypokalemia Resolved.   Debility. Home PT.          Consultants: Cardiology Procedures performed: None  Disposition: Home health Diet recommendation:  Discharge Diet Orders (From admission, onward)     Start     Ordered   07/21/23 0000  Diet - low sodium heart healthy        07/21/23 1512           Cardiac diet DISCHARGE MEDICATION: Allergies as of 07/21/2023       Reactions   Amoxicillin  Nausea Only   Eliquis  [apixaban ] Nausea Only        Medication List     STOP taking these medications    cefUROXime  250 MG tablet Commonly known as: CEFTIN    cetirizine  10 MG tablet Commonly known as: ZYRTEC    diltiazem 180 MG 24 hr capsule Commonly known as: CARDIZEM CD   hydrochlorothiazide  12.5 MG capsule Commonly known as: MICROZIDE    levofloxacin 250 MG tablet Commonly known as: LEVAQUIN   magnesium  oxide 400 (240 Mg) MG tablet Commonly known as: MAG-OX   predniSONE  20 MG tablet Commonly  known as: DELTASONE    Rivaroxaban  15 MG Tabs tablet Commonly known as: XARELTO        TAKE these medications    amiodarone  200 MG tablet Commonly known as: PACERONE  Take 2 tablets (400 mg total) by mouth 2 (two) times daily for 7 days, THEN 1 tablet (200 mg total) 2 (two) times daily for 7 days, THEN 1 tablet (200 mg total) daily. Start taking on: Jul 21, 2023   dabigatran  150 MG Caps capsule Commonly known as:  PRADAXA  Take 1 capsule (150 mg total) by mouth every 12 (twelve) hours.   folic acid  1 MG tablet Commonly known as: FOLVITE  Take 1 mg by mouth daily. Pt takes this med the days she does not take methotrexate    furosemide  40 MG tablet Commonly known as: LASIX  Take 1 tablet (40 mg total) by mouth daily. Start taking on: Jul 22, 2023   LORazepam 0.5 MG tablet Commonly known as: ATIVAN Take 0.5 mg by mouth in the morning.   methotrexate  2.5 MG tablet Commonly known as: RHEUMATREX Take 5 mg by mouth once a week. Caution:Chemotherapy. Protect from light. Pt take 2 weekly.   Metoprolol  Tartrate 37.5 MG Tabs Take 1 tablet (37.5 mg total) by mouth 2 (two) times daily. What changed:  medication strength how much to take   omeprazole  40 MG capsule Commonly known as: PRILOSEC Take 40 mg by mouth daily.   Trelegy Ellipta 100-62.5-25 MCG/ACT Aepb Generic drug: Fluticasone -Umeclidin-Vilant Inhale 1 puff into the lungs daily.        Follow-up Information     Sari Cunning, MD Follow up in 1 week(s).   Specialty: Internal Medicine Contact information: [phone] [facility] [address] [facility] [place] [address] [phone]         Devorah Fonder, MD Follow up in 2 week(s).   Specialty: Cardiology Contact information: [address] [place] [address] [phone]                Discharge Exam: [name] Weights   07/19/23 1945 07/20/23 0517 07/21/23 0459  Weight: 51.6 kg 54.7 kg 53.7 kg   General exam: Appears calm and comfortable  Respiratory system: Clear to auscultation. Respiratory effort normal. Cardiovascular system: S1 & S2 heard, RRR. No JVD, murmurs, rubs, gallops or clicks. No pedal edema. Gastrointestinal system: Abdomen is nondistended, soft and nontender. No organomegaly or masses felt. Normal bowel sounds heard. Central nervous system: Alert and oriented. No focal neurological deficits. Extremities: Symmetric  5 x 5 power. Skin: No rashes, lesions or ulcers Psychiatry: Judgement and insight appear normal. Mood & affect appropriate.    Condition at discharge: good  The results of significant diagnostics from this hospitalization (including imaging, microbiology, ancillary and laboratory) are listed below for reference.   Imaging Studies: CT Angio Chest PE W and/or Wo Contrast Result Date: 07/18/2023 CLINICAL DATA:  Concern for pulmonary embolism. EXAM: CT ANGIOGRAPHY CHEST WITH CONTRAST TECHNIQUE: Multidetector CT imaging of the chest was performed using the standard protocol during bolus administration of intravenous contrast. Multiplanar CT image reconstructions and MIPs were obtained to evaluate the vascular anatomy. RADIATION DOSE REDUCTION: This exam was performed according to the departmental dose-optimization program which includes automated exposure control, adjustment of the mA and/or kV according to patient size and/or use of iterative reconstruction technique. CONTRAST:  75mL OMNIPAQUE  IOHEXOL  350 MG/ML SOLN COMPARISON:  Chest CT dated 05/18/2023. FINDINGS: Evaluation of this exam is limited due to respiratory motion. Cardiovascular: There is no  cardiomegaly or pericardial effusion. Retrograde flow of contrast from the right atrium into the IVC. Coronary vascular calcification and calcification of the mitral annulus. Moderate atherosclerotic calcification of the thoracic aorta. No aneurysmal dilatation. Evaluation of the pulmonary arteries is limited due to respiratory motion. No central pulmonary artery embolus identified. Mediastinum/Nodes: No hilar adenopathy. Subcarinal lymph nodes measure 12 mm short axis. Small hiatal hernia. The esophagus is grossly unremarkable. No mediastinal fluid collection. Lungs/Pleura: Small bilateral pleural effusions, new since the prior CT. There is diffuse interstitial and interlobular septal prominence with patchy ground-glass densities most consistent with edema.  Pneumonia is not excluded. The central airways are patent. Upper Abdomen: No acute abnormality. Musculoskeletal: No acute osseous pathology. Review of the MIP images confirms the above findings. IMPRESSION: 1. No CT evidence of central pulmonary artery embolus. 2. Interstitial edema and small bilateral pleural effusions. Pneumonia is not excluded. 3.  Aortic Atherosclerosis (ICD10-I70.0). Electronically Signed   By: Angus Bark M.D.   On: 07/18/2023 13:25   DG Chest 2 View Result Date: 07/18/2023 CLINICAL DATA:  Shortness of breath.  COPD. EXAM: CHEST - 2 VIEW COMPARISON:  05/18/2023. FINDINGS: Low lung volume. There is new left basilar opacity obscuring the left hemidiaphragm, descending thoracic aorta and blunting the left lateral costophrenic angle, suggesting combination of left lung atelectasis and/or consolidation with pleural effusion. There are biapical pleural calcifications, similar to the prior study, nonspecific but commonly seen as a sequela of asbestos related disease. Bilateral lung fields are otherwise clear. There is trace right pleural effusion as well blunting the right costophrenic angle. Next Stable cardio-mediastinal silhouette. No acute osseous abnormalities. The soft tissues are within normal limits. IMPRESSION: New left retrocardiac opacity, as described above. Electronically Signed   By: Beula Brunswick M.D.   On: 07/18/2023 10:54    Microbiology: Results for orders placed or performed during the hospital encounter of 07/18/23  Blood Culture (routine x 2)     Status: None (Preliminary result)   Collection Time: 07/18/23 12:29 PM   Specimen: BLOOD  Result Value Ref Range Status   Specimen Description BLOOD RIGHT ANTECUBITAL  Final   Special Requests   Final    BOTTLES DRAWN AEROBIC AND ANAEROBIC Blood Culture results may not be optimal due to an inadequate volume of blood received in culture bottles   Culture   Final    NO GROWTH 3 DAYS Performed at [facility], [address] [place], [place] [address]    Report Status PENDING  Incomplete  Blood Culture (routine x 2)     Status: None (Preliminary result)   Collection Time: 07/18/23  1:20 PM   Specimen: BLOOD  Result Value Ref Range Status   Specimen Description BLOOD BLOOD RIGHT WRIST  Final   Special Requests   Final    BOTTLES DRAWN AEROBIC ONLY Blood Culture results may not be optimal due to an inadequate volume of blood received in culture bottles   Culture   Final    NO GROWTH 3 DAYS Performed at [facility], [address] [place], [place] [address]    Report Status PENDING  Incomplete    Labs: CBC: Recent Labs  Lab 07/18/23 1109 07/19/23 0509  WBC 17.6* 14.4*  NEUTROABS  --  12.1*  HGB 13.5 11.6*  HCT 41.0 34.8*  MCV 89.7 88.8  PLT 207 165   Basic Metabolic Panel: Recent Labs  Lab 07/18/23 1109 07/19/23 0509 07/20/23 0433  NA 140 138 138  K 3.5  2.9* 4.1  CL 105 103 102  CO2 25 26 24   GLUCOSE 131* 169* 111*  BUN 34* 32* 36*  CREATININE 0.81 0.69 0.76  CALCIUM  9.1 8.4* 8.7*  MG  --  2.0 2.1  PHOS  --   --  3.4   Liver Function Tests: Recent Labs  Lab 07/19/23 0509  AST 25  ALT 79*  ALKPHOS 99  BILITOT 0.7  PROT 6.3*  ALBUMIN 3.1*   CBG: No results for input(s): "GLUCAP" in the last 168 hours.  Discharge time spent: greater than 30 minutes.  Signed: Donaciano Frizzle, MD [facility] 07/21/2023

## 2023-07-21 NOTE — Progress Notes (Signed)
 Rounding Note    Patient Name: [name] Date of Encounter: 07/21/2023  [facility] Cardiologist: Sammy Crisp, MD   Subjective   Patient seen on a.m. rounds.  Sitting on the side of the bed eating breakfast.  Family at the bedside.  Denies any chest pain or shortness of breath.  Has maintained sinus rhythm.  Has received first oral doses of oral amiodarone  and Pradaxa  without any adverse GI side effects.  Inpatient Medications    Scheduled Meds:  amiodarone   400 mg Oral BID   Followed by   [name] ON 07/27/2023] amiodarone   200 mg Oral BID   Followed by   [name] ON 08/03/2023] amiodarone   200 mg Oral Daily   budesonide-glycopyrrolate -formoterol  2 puff Inhalation BID   dabigatran   150 mg Oral Q12H   folic acid   1 mg Oral Daily   furosemide   40 mg Oral Daily   metoprolol  tartrate  37.5 mg Oral BID   pantoprazole   40 mg Oral Daily   senna-docusate  2 tablet Oral BID   Continuous Infusions:  PRN Meds: acetaminophen  **OR** acetaminophen , LORazepam, ondansetron  (ZOFRAN ) IV   Vital Signs    Vitals:   07/20/23 2308 07/21/23 0309 07/21/23 0459 07/21/23 0742  BP: 125/66 130/69  136/64  Pulse: 72 67  72  Resp: 18 18  17   Temp: 97.7 F (36.5 C) 97.7 F (36.5 C)  97.9 F (36.6 C)  TempSrc:    Oral  SpO2: 98% 99%  94%  Weight:   53.7 kg   Height:        Intake/Output Summary (Last 24 hours) at 07/21/2023 0905 Last data filed at 07/20/2023 2000 Gross per 24 hour  Intake 325.2 ml  Output 400 ml  Net -74.8 ml      07/21/2023    4:59 AM 07/20/2023    5:17 AM 07/19/2023    7:45 PM  Last 3 Weights  Weight (lbs) 118 lb 4.8 oz 120 lb 9.5 oz 113 lb 12.1 oz  Weight (kg) 53.661 kg 54.7 kg 51.6 kg      Telemetry    Sinus rhythm rates 70-80- Personally Reviewed  ECG    No new tracings- Personally Reviewed  Physical Exam   GEN: No acute distress.   Neck: No JVD Cardiac: RRR, no murmurs, rubs, or gallops.  Respiratory: Clear with rales in the bases to  auscultation bilaterally. Respirations are unlabored at rest on 2L of O2 via [place] GI: Soft, nontender, non-distended  MS: No edema; No deformity. Neuro:  Nonfocal  Psych: Normal affect   Labs    High Sensitivity Troponin:   Recent Labs  Lab 07/18/23 1109  TROPONINIHS 19*     Chemistry Recent Labs  Lab 07/18/23 1109 07/19/23 0509 07/20/23 0433  NA 140 138 138  K 3.5 2.9* 4.1  CL 105 103 102  CO2 25 26 24   GLUCOSE 131* 169* 111*  BUN 34* 32* 36*  CREATININE 0.81 0.69 0.76  CALCIUM  9.1 8.4* 8.7*  MG  --  2.0 2.1  PROT  --  6.3*  --   ALBUMIN  --  3.1*  --   AST  --  25  --   ALT  --  79*  --   ALKPHOS  --  99  --   BILITOT  --  0.7  --   GFRNONAA >60 >60 >60  ANIONGAP 10 9 12     Lipids No results for input(s): "CHOL", "TRIG", "HDL", "LABVLDL", "  LDLCALC", "CHOLHDL" in the last 168 hours.  Hematology Recent Labs  Lab 07/18/23 1109 07/19/23 0509  WBC 17.6* 14.4*  RBC 4.57 3.92  HGB 13.5 11.6*  HCT 41.0 34.8*  MCV 89.7 88.8  MCH 29.5 29.6  MCHC 32.9 33.3  RDW 15.3 15.1  PLT 207 165   Thyroid   Recent Labs  Lab 07/18/23 1109  TSH 1.960  FREET4 1.38*    BNP Recent Labs  Lab 07/18/23 1109  BNP 451.7*    DDimer  Recent Labs  Lab 07/18/23 1109  DDIMER 0.92*     Radiology    No results found.  Cardiac Studies   2D echo 05/19/2023 1. Left ventricular ejection fraction, by estimation, is 60 to 65%. The  left ventricle has normal function. The left ventricle has no regional  wall motion abnormalities. There is mild left ventricular hypertrophy.  Left ventricular diastolic parameters  are consistent with Grade II diastolic dysfunction (pseudonormalization).   2. Right ventricular systolic function is normal. The right ventricular  size is normal. There is mildly elevated pulmonary artery systolic  pressure.   3. Left atrial size was mildly dilated.   4. The mitral valve is normal in structure. No evidence of mitral valve  regurgitation. Mild  mitral stenosis. The mean mitral valve gradient is 4.0  mmHg. Severe mitral annular calcification.   5. Tricuspid valve regurgitation is mild to moderate.   6. The aortic valve is normal in structure. Aortic valve regurgitation is  not visualized. Aortic valve sclerosis is present, with no evidence of  aortic valve stenosis.   7. The inferior vena cava is normal in size with greater than 50%  respiratory variability, suggesting right atrial pressure of 3 mmHg.   Patient Profile     89 y.o. female with a past medical history of paroxysmal atrial fibrillation, primary hypertension, GERD, COPD on 2 L of oxygen  at night, mixed hyperlipidemia, history of breast cancer status post chemoradiation, chronic HFpEF, who is being seen for evaluation of paroxysmal atrial fibrillation and acute on chronic HFpEF at [name]  Assessment & Plan    Atrial fibrillation with RVR -Presented with A-fib RVR was initially placed on diltiazem with the addition of amiodarone  infusion she converted to sinus rhythm on 07/19/2023 at 12:36 PM -Once she converted back to sinus rhythm diltiazem drip was discontinued -She is continued on the escalating doses of oral amiodarone  400 mg twice daily x 7 days then 200 mg twice daily x 7 days followed by 200 mg daily -She was also restarted on Pradaxa  150 mg twice daily for CHA2DS2-VASc score of at least 5 for stroke prophylaxis -Continued on metoprolol  37.5 mg twice daily -PTA diltiazem remains on hold -Continue with telemetry monitoring  Acute on chronic HFpEF -Presented with shortness of breath elevated D-dimer -CTA of the chest negative for PE -Echocardiogram revealed an LVEF of 60 to 65%, mild LVH, G2 DD, mild to moderate TR, moderately elevated PA systolic pressure -Slight elevation in BNP -Continued on furosemide  40 mg daily -Appears to be euvolemic on exam -NYHA class II symptoms -No accurate acidosis been documented -Daily weights and I's and  O's  COPD - Not currently in exacerbation -Continued on inhalers -Continued on oxygen  therapy with attempting to wean back to nightly use only  Primary hypertension -Blood pressure -Continued on furosemide  40 mg daily, metoprolol  tartrate 37.5 mg twice daily -PTA diltiazem remains on hold -Vital signs per unit protocol  Debility -Continue physical therapy  For questions or updates, please contact [facility] Please consult [email or web address] for contact info under        Signed, Chelsey Redondo, NP  07/21/2023, 9:05 AM

## 2023-07-21 NOTE — Plan of Care (Signed)
 O2 qualification: On Room air at rest O2 sat: 96% On Room air while ambulating O2 sat: 91%

## 2023-07-23 LAB — CULTURE, BLOOD (ROUTINE X 2)
Culture: NO GROWTH
Culture: NO GROWTH

## 2023-08-09 ENCOUNTER — Other Ambulatory Visit: Payer: Self-pay | Admitting: Nurse Practitioner

## 2023-08-09 DIAGNOSIS — E782 Mixed hyperlipidemia: Secondary | ICD-10-CM

## 2023-08-09 DIAGNOSIS — I5032 Chronic diastolic (congestive) heart failure: Secondary | ICD-10-CM

## 2023-08-09 DIAGNOSIS — R001 Bradycardia, unspecified: Secondary | ICD-10-CM

## 2023-08-09 DIAGNOSIS — R079 Chest pain, unspecified: Secondary | ICD-10-CM

## 2023-08-09 DIAGNOSIS — I10 Essential (primary) hypertension: Secondary | ICD-10-CM

## 2023-08-09 DIAGNOSIS — I480 Paroxysmal atrial fibrillation: Secondary | ICD-10-CM

## 2023-08-09 DIAGNOSIS — I7090 Unspecified atherosclerosis: Secondary | ICD-10-CM

## 2023-08-11 ENCOUNTER — Ambulatory Visit: Admitting: Medical

## 2023-08-16 ENCOUNTER — Emergency Department

## 2023-08-16 ENCOUNTER — Inpatient Hospital Stay: Admitting: Certified Registered Nurse Anesthetist

## 2023-08-16 ENCOUNTER — Inpatient Hospital Stay

## 2023-08-16 ENCOUNTER — Other Ambulatory Visit: Payer: Self-pay

## 2023-08-16 ENCOUNTER — Telehealth (HOSPITAL_COMMUNITY): Payer: Self-pay | Admitting: Emergency Medicine

## 2023-08-16 ENCOUNTER — Inpatient Hospital Stay
Admission: EM | Admit: 2023-08-16 | Discharge: 2023-08-21 | DRG: 481 | Disposition: A | Discharge status: Skilled Nursing Facility | Attending: Internal Medicine | Admitting: Internal Medicine

## 2023-08-16 ENCOUNTER — Encounter
Admission: EM | Disposition: A | Discharge status: Skilled Nursing Facility | Payer: Self-pay | Source: Home / Self Care | Attending: Internal Medicine

## 2023-08-16 DIAGNOSIS — S7291XS Unspecified fracture of right femur, sequela: Secondary | ICD-10-CM | POA: Diagnosis not present

## 2023-08-16 DIAGNOSIS — I10 Essential (primary) hypertension: Secondary | ICD-10-CM | POA: Diagnosis present

## 2023-08-16 DIAGNOSIS — J449 Chronic obstructive pulmonary disease, unspecified: Secondary | ICD-10-CM | POA: Diagnosis not present

## 2023-08-16 DIAGNOSIS — W010XXA Fall on same level from slipping, tripping and stumbling without subsequent striking against object, initial encounter: Secondary | ICD-10-CM | POA: Diagnosis present

## 2023-08-16 DIAGNOSIS — N390 Urinary tract infection, site not specified: Secondary | ICD-10-CM | POA: Diagnosis present

## 2023-08-16 DIAGNOSIS — Z7901 Long term (current) use of anticoagulants: Secondary | ICD-10-CM | POA: Diagnosis not present

## 2023-08-16 DIAGNOSIS — Z880 Allergy status to penicillin: Secondary | ICD-10-CM | POA: Diagnosis not present

## 2023-08-16 DIAGNOSIS — Z9841 Cataract extraction status, right eye: Secondary | ICD-10-CM

## 2023-08-16 DIAGNOSIS — I700 Atherosclerosis of aorta: Secondary | ICD-10-CM | POA: Diagnosis present

## 2023-08-16 DIAGNOSIS — L4050 Arthropathic psoriasis, unspecified: Secondary | ICD-10-CM | POA: Diagnosis present

## 2023-08-16 DIAGNOSIS — Z961 Presence of intraocular lens: Secondary | ICD-10-CM | POA: Diagnosis present

## 2023-08-16 DIAGNOSIS — S72001A Fracture of unspecified part of neck of right femur, initial encounter for closed fracture: Secondary | ICD-10-CM | POA: Diagnosis present

## 2023-08-16 DIAGNOSIS — Z803 Family history of malignant neoplasm of breast: Secondary | ICD-10-CM | POA: Diagnosis not present

## 2023-08-16 DIAGNOSIS — Y92002 Bathroom of unspecified non-institutional (private) residence single-family (private) house as the place of occurrence of the external cause: Secondary | ICD-10-CM | POA: Diagnosis not present

## 2023-08-16 DIAGNOSIS — Z9842 Cataract extraction status, left eye: Secondary | ICD-10-CM | POA: Diagnosis not present

## 2023-08-16 DIAGNOSIS — Z79899 Other long term (current) drug therapy: Secondary | ICD-10-CM | POA: Diagnosis not present

## 2023-08-16 DIAGNOSIS — I482 Chronic atrial fibrillation, unspecified: Secondary | ICD-10-CM | POA: Diagnosis present

## 2023-08-16 DIAGNOSIS — D72829 Elevated white blood cell count, unspecified: Secondary | ICD-10-CM | POA: Diagnosis present

## 2023-08-16 DIAGNOSIS — J4489 Other specified chronic obstructive pulmonary disease: Secondary | ICD-10-CM | POA: Diagnosis present

## 2023-08-16 DIAGNOSIS — I272 Pulmonary hypertension, unspecified: Secondary | ICD-10-CM | POA: Diagnosis present

## 2023-08-16 DIAGNOSIS — M1611 Unilateral primary osteoarthritis, right hip: Secondary | ICD-10-CM | POA: Diagnosis present

## 2023-08-16 DIAGNOSIS — K219 Gastro-esophageal reflux disease without esophagitis: Secondary | ICD-10-CM | POA: Diagnosis present

## 2023-08-16 DIAGNOSIS — M25551 Pain in right hip: Principal | ICD-10-CM | POA: Diagnosis present

## 2023-08-16 DIAGNOSIS — M47812 Spondylosis without myelopathy or radiculopathy, cervical region: Secondary | ICD-10-CM | POA: Diagnosis present

## 2023-08-16 DIAGNOSIS — Z888 Allergy status to other drugs, medicaments and biological substances status: Secondary | ICD-10-CM | POA: Diagnosis not present

## 2023-08-16 DIAGNOSIS — D6859 Other primary thrombophilia: Secondary | ICD-10-CM | POA: Diagnosis present

## 2023-08-16 DIAGNOSIS — Z853 Personal history of malignant neoplasm of breast: Secondary | ICD-10-CM

## 2023-08-16 DIAGNOSIS — E785 Hyperlipidemia, unspecified: Secondary | ICD-10-CM | POA: Diagnosis present

## 2023-08-16 HISTORY — PX: HIP PINNING,CANNULATED: SHX1758

## 2023-08-16 LAB — CBC WITH DIFFERENTIAL/PLATELET
Abs Immature Granulocytes: 0.47 10*3/uL — ABNORMAL HIGH (ref 0.00–0.07)
Basophils Absolute: 0.1 10*3/uL (ref 0.0–0.1)
Basophils Relative: 0 %
Eosinophils Absolute: 0.1 10*3/uL (ref 0.0–0.5)
Eosinophils Relative: 0 %
HCT: 46.2 % — ABNORMAL HIGH (ref 36.0–46.0)
Hemoglobin: 15 g/dL (ref 12.0–15.0)
Immature Granulocytes: 2 %
Lymphocytes Relative: 5 %
Lymphs Abs: 1.3 10*3/uL (ref 0.7–4.0)
MCH: 29.2 pg (ref 26.0–34.0)
MCHC: 32.5 g/dL (ref 30.0–36.0)
MCV: 89.9 fL (ref 80.0–100.0)
Monocytes Absolute: 1.6 10*3/uL — ABNORMAL HIGH (ref 0.1–1.0)
Monocytes Relative: 6 %
Neutro Abs: 22.5 10*3/uL — ABNORMAL HIGH (ref 1.7–7.7)
Neutrophils Relative %: 87 %
Platelets: 248 10*3/uL (ref 150–400)
RBC: 5.14 MIL/uL — ABNORMAL HIGH (ref 3.87–5.11)
RDW: 14.6 % (ref 11.5–15.5)
Smear Review: NORMAL
WBC: 26.1 10*3/uL — ABNORMAL HIGH (ref 4.0–10.5)
nRBC: 0 % (ref 0.0–0.2)

## 2023-08-16 LAB — COMPREHENSIVE METABOLIC PANEL WITH GFR
ALT: 23 U/L (ref 0–44)
AST: 23 U/L (ref 15–41)
Albumin: 3.5 g/dL (ref 3.5–5.0)
Alkaline Phosphatase: 66 U/L (ref 38–126)
Anion gap: 12 (ref 5–15)
BUN: 20 mg/dL (ref 8–23)
CO2: 24 mmol/L (ref 22–32)
Calcium: 8.9 mg/dL (ref 8.9–10.3)
Chloride: 104 mmol/L (ref 98–111)
Creatinine, Ser: 0.89 mg/dL (ref 0.44–1.00)
GFR, Estimated: >60 mL/min (ref >60)
Glucose, Bld: 129 mg/dL — ABNORMAL HIGH (ref 70–99)
Potassium: 3.4 mmol/L — ABNORMAL LOW (ref 3.5–5.1)
Sodium: 140 mmol/L (ref 135–145)
Total Bilirubin: 0.7 mg/dL (ref 0.0–1.2)
Total Protein: 7.3 g/dL (ref 6.5–8.1)

## 2023-08-16 LAB — TYPE AND SCREEN
ABO/RH(D): O POS
Antibody Screen: NEGATIVE

## 2023-08-16 LAB — APTT: aPTT: 39 s — ABNORMAL HIGH (ref 24–36)

## 2023-08-16 SURGERY — FIXATION, FEMUR, NECK, PERCUTANEOUS, USING SCREW
Anesthesia: General | Site: Hip | Laterality: Right

## 2023-08-16 MED ORDER — DROPERIDOL 2.5 MG/ML IJ SOLN: 0.6250 mg | Freq: Once | INTRAMUSCULAR | Status: DC | PRN

## 2023-08-16 MED ORDER — PROPOFOL 1000 MG/100ML IV EMUL
INTRAVENOUS | Status: AC
  Filled 2023-08-16: qty 100

## 2023-08-16 MED ORDER — BUPIVACAINE-EPINEPHRINE (PF) 0.5% -1:200000 IJ SOLN
INTRAMUSCULAR | Status: AC
Start: 2023-08-16 — End: 2023-08-16
  Filled 2023-08-16: qty 30

## 2023-08-16 MED ORDER — EPHEDRINE SULFATE-NACL 50-0.9 MG/10ML-% IV SOSY
PREFILLED_SYRINGE | INTRAVENOUS | Status: DC | PRN
  Administered 2023-08-16: 10 mg via INTRAVENOUS
  Administered 2023-08-16: 5 mg via INTRAVENOUS
  Administered 2023-08-16: 10 mg via INTRAVENOUS

## 2023-08-16 MED ORDER — FENTANYL CITRATE (PF) 100 MCG/2ML IJ SOLN
INTRAMUSCULAR | Status: AC
  Filled 2023-08-16: qty 2

## 2023-08-16 MED ORDER — LACTATED RINGERS IV SOLN: INTRAVENOUS | Status: DC | PRN

## 2023-08-16 MED ORDER — ONDANSETRON HCL 4 MG/2ML IJ SOLN
INTRAMUSCULAR | Status: AC
  Filled 2023-08-16: qty 2

## 2023-08-16 MED ORDER — OXYCODONE HCL 5 MG/5ML PO SOLN: 5.0000 mg | Freq: Once | ORAL | Status: DC | PRN

## 2023-08-16 MED ORDER — ONDANSETRON HCL 4 MG PO TABS
4.0000 mg | ORAL_TABLET | Freq: Four times a day (QID) | ORAL | Status: DC | PRN
  Administered 2023-08-20: 4 mg via ORAL
  Filled 2023-08-16 (×2): qty 1

## 2023-08-16 MED ORDER — DEXAMETHASONE SODIUM PHOSPHATE 10 MG/ML IJ SOLN
INTRAMUSCULAR | Status: AC
  Filled 2023-08-16: qty 1

## 2023-08-16 MED ORDER — FENTANYL CITRATE (PF) 100 MCG/2ML IJ SOLN
INTRAMUSCULAR | Status: AC
Start: 2023-08-16 — End: 2023-08-16
  Filled 2023-08-16: qty 2

## 2023-08-16 MED ORDER — METOPROLOL TARTRATE 25 MG PO TABS
37.5000 mg | ORAL_TABLET | Freq: Two times a day (BID) | ORAL | Status: DC
  Administered 2023-08-16 – 2023-08-21 (×10): 37.5 mg via ORAL
  Filled 2023-08-16 (×10): qty 2

## 2023-08-16 MED ORDER — ONDANSETRON HCL 4 MG/2ML IJ SOLN
INTRAMUSCULAR | Status: DC | PRN
  Administered 2023-08-16: 4 mg via INTRAVENOUS

## 2023-08-16 MED ORDER — HYDROCHLOROTHIAZIDE 25 MG PO TABS
25.0000 mg | ORAL_TABLET | Freq: Every day | ORAL | Status: DC
  Administered 2023-08-17 – 2023-08-21 (×5): 25 mg via ORAL
  Filled 2023-08-16 (×5): qty 1

## 2023-08-16 MED ORDER — ACETAMINOPHEN 500 MG PO TABS
500.0000 mg | ORAL_TABLET | Freq: Four times a day (QID) | ORAL | Status: AC
  Administered 2023-08-17 (×2): 500 mg via ORAL
  Filled 2023-08-16 (×3): qty 1

## 2023-08-16 MED ORDER — SENNA 8.6 MG PO TABS
1.0000 | ORAL_TABLET | Freq: Two times a day (BID) | ORAL | Status: DC
  Administered 2023-08-16 – 2023-08-18 (×5): 8.6 mg via ORAL
  Filled 2023-08-16 (×9): qty 1

## 2023-08-16 MED ORDER — PHENYLEPHRINE 80 MCG/ML (10ML) SYRINGE FOR IV PUSH (FOR BLOOD PRESSURE SUPPORT)
PREFILLED_SYRINGE | INTRAVENOUS | Status: DC | PRN
  Administered 2023-08-16 (×2): 80 ug via INTRAVENOUS

## 2023-08-16 MED ORDER — ACETAMINOPHEN 325 MG PO TABS
650.0000 mg | ORAL_TABLET | Freq: Four times a day (QID) | ORAL | Status: DC | PRN
  Administered 2023-08-19: 650 mg via ORAL
  Filled 2023-08-16: qty 2

## 2023-08-16 MED ORDER — ACETAMINOPHEN 10 MG/ML IV SOLN
INTRAVENOUS | Status: AC
  Filled 2023-08-16: qty 100

## 2023-08-16 MED ORDER — SODIUM CHLORIDE 0.9 % IV SOLN: INTRAVENOUS | Status: DC

## 2023-08-16 MED ORDER — BUDESON-GLYCOPYRROL-FORMOTEROL 160-9-4.8 MCG/ACT IN AERO
2.0000 | INHALATION_SPRAY | Freq: Two times a day (BID) | RESPIRATORY_TRACT | Status: DC
  Administered 2023-08-17 – 2023-08-21 (×9): 2 via RESPIRATORY_TRACT
  Filled 2023-08-16: qty 5.9

## 2023-08-16 MED ORDER — ONDANSETRON 4 MG PO TBDP
4.0000 mg | ORAL_TABLET | Freq: Once | ORAL | Status: AC
  Administered 2023-08-16: 4 mg via ORAL
  Filled 2023-08-16: qty 1

## 2023-08-16 MED ORDER — ONDANSETRON HCL 4 MG/2ML IJ SOLN
4.0000 mg | Freq: Four times a day (QID) | INTRAMUSCULAR | Status: DC | PRN
  Administered 2023-08-16 – 2023-08-19 (×3): 4 mg via INTRAVENOUS
  Filled 2023-08-16 (×3): qty 2

## 2023-08-16 MED ORDER — HYDROCODONE-ACETAMINOPHEN 5-325 MG PO TABS
1.0000 | ORAL_TABLET | ORAL | Status: DC | PRN
  Administered 2023-08-18: 2 via ORAL
  Filled 2023-08-16: qty 2

## 2023-08-16 MED ORDER — FENTANYL CITRATE (PF) 100 MCG/2ML IJ SOLN
25.0000 ug | INTRAMUSCULAR | Status: DC | PRN
  Administered 2023-08-16: 25 ug via INTRAVENOUS

## 2023-08-16 MED ORDER — PROPOFOL 10 MG/ML IV BOLUS
INTRAVENOUS | Status: AC
  Filled 2023-08-16: qty 20

## 2023-08-16 MED ORDER — MORPHINE SULFATE (PF) 2 MG/ML IV SOLN: 2.0000 mg | INTRAVENOUS | Status: DC | PRN

## 2023-08-16 MED ORDER — METHOTREXATE SODIUM 2.5 MG PO TABS
5.0000 mg | ORAL_TABLET | ORAL | Status: DC
  Administered 2023-08-20: 5 mg via ORAL
  Filled 2023-08-16: qty 2

## 2023-08-16 MED ORDER — CEFAZOLIN SODIUM-DEXTROSE 2-4 GM/100ML-% IV SOLN
2.0000 g | INTRAVENOUS | Status: AC
  Administered 2023-08-16: 2 g via INTRAVENOUS
  Filled 2023-08-16: qty 100

## 2023-08-16 MED ORDER — FENTANYL CITRATE (PF) 100 MCG/2ML IJ SOLN
INTRAMUSCULAR | Status: DC | PRN
  Administered 2023-08-16 (×2): 50 ug via INTRAVENOUS

## 2023-08-16 MED ORDER — FLEET ENEMA RE ENEM: 1.0000 | ENEMA | Freq: Once | RECTAL | Status: DC | PRN

## 2023-08-16 MED ORDER — EPHEDRINE 5 MG/ML INJ
INTRAVENOUS | Status: AC
  Filled 2023-08-16: qty 5

## 2023-08-16 MED ORDER — OXYCODONE HCL 5 MG PO TABS
5.0000 mg | ORAL_TABLET | Freq: Once | ORAL | Status: AC
  Administered 2023-08-16: 5 mg via ORAL
  Filled 2023-08-16: qty 1

## 2023-08-16 MED ORDER — BUPIVACAINE-EPINEPHRINE (PF) 0.5% -1:200000 IJ SOLN
INTRAMUSCULAR | Status: DC | PRN
  Administered 2023-08-16: 30 mL

## 2023-08-16 MED ORDER — BUDESON-GLYCOPYRROL-FORMOTEROL 160-9-4.8 MCG/ACT IN AERO
2.0000 | INHALATION_SPRAY | Freq: Two times a day (BID) | RESPIRATORY_TRACT | Status: DC
  Filled 2023-08-16 (×2): qty 5.9

## 2023-08-16 MED ORDER — POLYETHYLENE GLYCOL 3350 17 G PO PACK: 17.0000 g | PACK | Freq: Every day | ORAL | Status: DC | PRN

## 2023-08-16 MED ORDER — 0.9 % SODIUM CHLORIDE (POUR BTL) OPTIME
TOPICAL | Status: DC | PRN
  Administered 2023-08-16: 500 mL

## 2023-08-16 MED ORDER — BISACODYL 10 MG RE SUPP: 10.0000 mg | Freq: Every day | RECTAL | Status: DC | PRN

## 2023-08-16 MED ORDER — METOCLOPRAMIDE HCL 5 MG/ML IJ SOLN: 5.0000 mg | Freq: Three times a day (TID) | INTRAMUSCULAR | Status: DC | PRN

## 2023-08-16 MED ORDER — FOLIC ACID 1 MG PO TABS
1.0000 mg | ORAL_TABLET | Freq: Every day | ORAL | Status: DC
  Administered 2023-08-17 – 2023-08-21 (×4): 1 mg via ORAL
  Filled 2023-08-16 (×5): qty 1

## 2023-08-16 MED ORDER — DEXAMETHASONE SODIUM PHOSPHATE 10 MG/ML IJ SOLN
INTRAMUSCULAR | Status: DC | PRN
  Administered 2023-08-16: 5 mg via INTRAVENOUS

## 2023-08-16 MED ORDER — TRAZODONE HCL 50 MG PO TABS: 25.0000 mg | ORAL_TABLET | Freq: Every evening | ORAL | Status: DC | PRN

## 2023-08-16 MED ORDER — CEFAZOLIN SODIUM-DEXTROSE 2-4 GM/100ML-% IV SOLN
2.0000 g | Freq: Four times a day (QID) | INTRAVENOUS | Status: AC
  Administered 2023-08-16 – 2023-08-17 (×2): 2 g via INTRAVENOUS
  Filled 2023-08-16 (×2): qty 100

## 2023-08-16 MED ORDER — DOCUSATE SODIUM 100 MG PO CAPS
100.0000 mg | ORAL_CAPSULE | Freq: Two times a day (BID) | ORAL | Status: DC
  Administered 2023-08-16 – 2023-08-19 (×6): 100 mg via ORAL
  Filled 2023-08-16 (×9): qty 1

## 2023-08-16 MED ORDER — ACETAMINOPHEN 650 MG RE SUPP: 650.0000 mg | Freq: Four times a day (QID) | RECTAL | Status: DC | PRN

## 2023-08-16 MED ORDER — DIPHENHYDRAMINE HCL 12.5 MG/5ML PO ELIX: 12.5000 mg | ORAL_SOLUTION | ORAL | Status: DC | PRN

## 2023-08-16 MED ORDER — PHENYLEPHRINE 80 MCG/ML (10ML) SYRINGE FOR IV PUSH (FOR BLOOD PRESSURE SUPPORT)
PREFILLED_SYRINGE | INTRAVENOUS | Status: AC
  Filled 2023-08-16: qty 10

## 2023-08-16 MED ORDER — DABIGATRAN ETEXILATE MESYLATE 150 MG PO CAPS
150.0000 mg | ORAL_CAPSULE | Freq: Two times a day (BID) | ORAL | Status: DC
  Administered 2023-08-17 – 2023-08-21 (×9): 150 mg via ORAL
  Filled 2023-08-16 (×10): qty 1

## 2023-08-16 MED ORDER — SUCCINYLCHOLINE CHLORIDE 200 MG/10ML IV SOSY
PREFILLED_SYRINGE | INTRAVENOUS | Status: DC | PRN
  Administered 2023-08-16: 80 mg via INTRAVENOUS

## 2023-08-16 MED ORDER — BUPIVACAINE LIPOSOME 1.3 % IJ SUSP
INTRAMUSCULAR | Status: DC | PRN
  Administered 2023-08-16: 10 mL

## 2023-08-16 MED ORDER — PROPOFOL 10 MG/ML IV BOLUS
INTRAVENOUS | Status: DC | PRN
  Administered 2023-08-16: 120 mg via INTRAVENOUS
  Administered 2023-08-16: 75 ug/kg/min via INTRAVENOUS

## 2023-08-16 MED ORDER — SEVOFLURANE IN SOLN
RESPIRATORY_TRACT | Status: AC
  Filled 2023-08-16: qty 250

## 2023-08-16 MED ORDER — ACETAMINOPHEN 10 MG/ML IV SOLN: 1000.0000 mg | Freq: Once | INTRAVENOUS | Status: DC | PRN

## 2023-08-16 MED ORDER — LIDOCAINE HCL (CARDIAC) PF 100 MG/5ML IV SOSY
PREFILLED_SYRINGE | INTRAVENOUS | Status: DC | PRN
  Administered 2023-08-16: 60 mg via INTRAVENOUS

## 2023-08-16 MED ORDER — ROCURONIUM BROMIDE 10 MG/ML (PF) SYRINGE
PREFILLED_SYRINGE | INTRAVENOUS | Status: AC
  Filled 2023-08-16: qty 10

## 2023-08-16 MED ORDER — PANTOPRAZOLE SODIUM 40 MG PO TBEC
40.0000 mg | DELAYED_RELEASE_TABLET | Freq: Every day | ORAL | Status: DC
  Administered 2023-08-17 – 2023-08-21 (×5): 40 mg via ORAL
  Filled 2023-08-16 (×5): qty 1

## 2023-08-16 MED ORDER — LORAZEPAM 0.5 MG PO TABS
0.5000 mg | ORAL_TABLET | Freq: Every morning | ORAL | Status: DC
  Administered 2023-08-17 – 2023-08-21 (×5): 0.5 mg via ORAL
  Filled 2023-08-16 (×5): qty 1

## 2023-08-16 MED ORDER — AMIODARONE HCL 200 MG PO TABS
200.0000 mg | ORAL_TABLET | Freq: Every day | ORAL | Status: DC
  Administered 2023-08-17 – 2023-08-21 (×5): 200 mg via ORAL
  Filled 2023-08-16 (×5): qty 1

## 2023-08-16 MED ORDER — OXYCODONE HCL 5 MG PO TABS: 5.0000 mg | ORAL_TABLET | Freq: Once | ORAL | Status: DC | PRN

## 2023-08-16 MED ORDER — POTASSIUM CHLORIDE CRYS ER 20 MEQ PO TBCR
20.0000 meq | EXTENDED_RELEASE_TABLET | Freq: Once | ORAL | Status: AC
  Administered 2023-08-16: 20 meq via ORAL
  Filled 2023-08-16: qty 1

## 2023-08-16 MED ORDER — SUCCINYLCHOLINE CHLORIDE 200 MG/10ML IV SOSY
PREFILLED_SYRINGE | INTRAVENOUS | Status: AC
  Filled 2023-08-16: qty 10

## 2023-08-16 MED ORDER — ACETAMINOPHEN 10 MG/ML IV SOLN
INTRAVENOUS | Status: DC | PRN
  Administered 2023-08-16: 1000 mg via INTRAVENOUS

## 2023-08-16 MED ORDER — METOCLOPRAMIDE HCL 5 MG PO TABS: 5.0000 mg | ORAL_TABLET | Freq: Three times a day (TID) | ORAL | Status: DC | PRN

## 2023-08-16 MED ORDER — FUROSEMIDE 40 MG PO TABS
40.0000 mg | ORAL_TABLET | Freq: Every day | ORAL | Status: DC
  Administered 2023-08-16 – 2023-08-17 (×2): 40 mg via ORAL
  Filled 2023-08-16 (×2): qty 1

## 2023-08-16 MED ORDER — SODIUM CHLORIDE 0.9 % IV SOLN: INTRAVENOUS | Status: AC

## 2023-08-16 SURGICAL SUPPLY — 33 items
BIT DRILL 4.8X300 (BIT) IMPLANT
BNDG COHESIVE 4X5 TAN STRL LF (GAUZE/BANDAGES/DRESSINGS) ×1 IMPLANT
BNDG COHESIVE 6X5 TAN ST LF (GAUZE/BANDAGES/DRESSINGS) ×1 IMPLANT
CHLORAPREP W/TINT 26 (MISCELLANEOUS) ×2 IMPLANT
DRSG OPSITE POSTOP 3X4 (GAUZE/BANDAGES/DRESSINGS) ×1 IMPLANT
ELECTRODE REM PT RTRN 9FT ADLT (ELECTROSURGICAL) ×1 IMPLANT
GLOVE BIO SURGEON STRL SZ8 (GLOVE) ×2 IMPLANT
GLOVE INDICATOR 8.0 STRL GRN (GLOVE) ×1 IMPLANT
GOWN STRL REUS W/ TWL LRG LVL3 (GOWN DISPOSABLE) ×1 IMPLANT
GOWN STRL REUS W/ TWL XL LVL3 (GOWN DISPOSABLE) ×1 IMPLANT
HANDLE YANKAUER SUCT OPEN TIP (MISCELLANEOUS) ×1 IMPLANT
MANIFOLD NEPTUNE II (INSTRUMENTS) ×1 IMPLANT
MAT ABSORB FLUID 56X50 GRAY (MISCELLANEOUS) ×1 IMPLANT
NDL FILTER BLUNT 18X1 1/2 (NEEDLE) ×1 IMPLANT
NDL HYPO 22X1.5 SAFETY MO (MISCELLANEOUS) ×1 IMPLANT
NEEDLE FILTER BLUNT 18X1 1/2 (NEEDLE) ×1 IMPLANT
NEEDLE HYPO 22X1.5 SAFETY MO (MISCELLANEOUS) ×1 IMPLANT
PACK HIP COMPR (MISCELLANEOUS) ×1 IMPLANT
PENCIL SMOKE EVACUATOR (MISCELLANEOUS) ×1 IMPLANT
PIN GUIDE DRIL TIP 2.8X300 STE (PIN) IMPLANT
SCREW 16MM THREAD 6.5X75MM (Screw) IMPLANT
SCREW 16MM THREAD 6.5X85MM (Screw) IMPLANT
STAPLER SKIN PROX 35W (STAPLE) ×1 IMPLANT
STRAP SAFETY 5IN WIDE (MISCELLANEOUS) ×1 IMPLANT
SUT PROLENE 2 0 FS (SUTURE) IMPLANT
SUT VIC AB 0 CT1 36 (SUTURE) ×1 IMPLANT
SUT VIC AB 0 SH 27 (SUTURE) IMPLANT
SUT VIC AB 2-0 CT1 TAPERPNT 27 (SUTURE) ×1 IMPLANT
SYR 20ML LL LF (SYRINGE) ×1 IMPLANT
SYR 5ML LL (SYRINGE) ×1 IMPLANT
TRAP FLUID SMOKE EVACUATOR (MISCELLANEOUS) ×1 IMPLANT
WASHER FLAT 6.5MM (Washer) IMPLANT
WATER STERILE IRR 500ML POUR (IV SOLUTION) ×1 IMPLANT

## 2023-08-16 NOTE — Anesthesia Procedure Notes (Signed)
 Procedure Name: Intubation Date/Time: 08/16/2023 4:23 PM  Performed by: Izell Marsh, CRNAPre-anesthesia Checklist: Patient identified, Patient being monitored, Timeout performed, Emergency Drugs available and Suction available Patient Re-evaluated:Patient Re-evaluated prior to induction Oxygen  Delivery Method: Circle system utilized Preoxygenation: Pre-oxygenation with 100% oxygen  Induction Type: IV induction Ventilation: Mask ventilation without difficulty Laryngoscope Size: 3 and McGrath Grade View: Grade I Tube type: Oral Tube size: 6.5 mm Number of attempts: 1 Airway Equipment and Method: Stylet Placement Confirmation: ETT inserted through vocal cords under direct vision, positive ETCO2 and breath sounds checked- equal and bilateral Secured at: 21 cm Tube secured with: Tape Dental Injury: Teeth and Oropharynx as per pre-operative assessment

## 2023-08-16 NOTE — Transfer of Care (Signed)
 Immediate Anesthesia Transfer of Care Note  Patient: [name]  Procedure(s) Performed: FIXATION, FEMUR, NECK, PERCUTANEOUS, USING SCREW (Right: Hip)  Patient Location: PACU  Anesthesia Type:General  Level of Consciousness: drowsy and patient cooperative  Airway & Oxygen  Therapy: Patient Spontanous Breathing and Patient connected to nasal cannula oxygen   Post-op Assessment: Report given to RN and Post -op Vital signs reviewed and stable  Post vital signs: Reviewed and stable  Last Vitals:  Vitals Value Taken Time  BP 127/53 08/16/23 1724  Temp    Pulse 74 08/16/23 1724  Resp 16 08/16/23 1724  SpO2 100 % 08/16/23 1724  Vitals shown include unfiled device data.  Last Pain:  Vitals:   08/16/23 1546  TempSrc: Temporal  PainSc: 0-No pain         Complications: No notable events documented.

## 2023-08-16 NOTE — ED Triage Notes (Signed)
 Pt to ED via POV from home. Pt reports was sitting on stool in bathroom and fell off hitting head on right side and right hip. Pt denies LOC. Pt reports is on blood thinner. Pt denies dizziness. Pt has been able to stand on right hip with difficulty.

## 2023-08-16 NOTE — Progress Notes (Signed)
 1508 Informed consent signed and place in chart. CHG wipes completed

## 2023-08-16 NOTE — Op Note (Signed)
 08/16/2023  5:17 PM  Patient:   [name]  Pre-Op Diagnosis:   Valgus-impacted right femoral neck fracture.  Post-Op Diagnosis:   Same.  Procedure:   In situ cannulated screw fixation of valgus-impacted right femoral neck fracture.  Surgeon:   Lonnie Roberts, MD  Assistant:   None  Anesthesia:   GET  Findings:   As above.  Complications:   None  EBL:   15 cc  Fluids:   150 cc crystalloid  UOP:   None  TT:   None  Drains:   None  Closure:   Staples  Implants:   Biomet 6.5 mm cannulated screws (16 mm) 3  Brief Clinical Note:   The patient is an 89 year old female who sustained the above-noted injury earlier this morning when she apparently lost her balance and fell off a stool in her bathroom while reaching for her lipstick. The patient presented to the emergency room where x-rays demonstrated the above-noted findings. The patient has been cleared medically and presents at this time for definitive management of this injury.  Procedure:   The patient was brought into the operating room and lain in the supine position. After adequate general endotracheal intubation and anesthesia was obtained, the patient was repositioned in the supine position on the fracture table. The uninjured leg was placed in a flexed and abducted position over the well-leg holder while the operative leg was placed in gentle longitudinal traction with some internal rotation. The adequacy of fracture position was verified fluoroscopically in AP and lateral projections before the lateral aspect of the right hip and thigh were prepped with ChloraPrep solution and draped sterilely. Preoperative antibiotics were administered. A timeout was performed to verify the appropriate surgical site.   An approximately 3-4 cm incision was made over the lateral aspect of the lower part of the greater trochanter as verified fluoroscopically. This incision was carried down through the subcutaneous cutaneous tissues to  expose the iliotibial band. This was split the length the incision to expose the lateral aspect of the proximal femur at the level of the inferior most part of the greater trochanter. Under fluoroscopic guidance, a guide wire was drilled up through the femoral neck along the calcar into the femoral head to rest within 5 mm of subchondral bone. Its position was assessed fluoroscopically in AP and lateral projections and found to be excellent. Two additional guide wires were placed in parallel fashion more proximally, anterior and posterior to the original pin in an inverted triangular configuration. Again the position of these pins was verified fluoroscopically in AP, lateral, and oblique projections and found to be excellent. The length of each of these pins was measured before each pin was overdrilled with the appropriate cannulated drill. Each of these screws was inserted and advanced to within 8-10 mm of subchondral bone. Again the position of each of these screws was assessed fluoroscopically in AP, lateral, and oblique projections, and found to be excellent.  The wound was copiously irrigated with sterile saline solution before the IT band was reapproximated using #0 Vicryl interrupted sutures. The subcutaneous tissues also were closed using 2-0 Vicryl interrupted sutures before the skin was closed using staples. A total of 10 cc of Exparel plus 30 cc of 0.5% Sensorcaine with epinephrine  was injected in and around the surgical incision before a sterile occlusive dressing was applied to the wound. The patient was awakened, extubated, and returned to the recovery room in satisfactory condition after tolerating the procedure well.

## 2023-08-16 NOTE — ED Provider Notes (Signed)
 [facility] Provider Note    Event Date/Time   First MD Initiated Contact with Patient 08/16/23 1025     (approximate)   History   Fall   HPI  [name] is a 89 y.o. female  with history of HTN, atrial fibrillation on Pradaxa , COPD and as listed in EMR presents to the emergency department for treatment and evaluation of right hip pain after mechanical, nonsyncopal fall.  She was sitting on a stool in the bathroom and reached forward to get her lipstick off the counter, lost her balance, and fell.  She landed on her hip.  She also hit the right side of her head.  No loss of consciousness.  Injury was witnessed by family.      Physical Exam   Triage Vital Signs: ED Triage Vitals  Encounter Vitals Group     BP 08/16/23 1002 (!) 172/70     Systolic BP Percentile --      Diastolic BP Percentile --      Pulse Rate 08/16/23 1002 66     Resp 08/16/23 1002 20     Temp 08/16/23 1002 97.9 F (36.6 C)     Temp Source 08/16/23 1002 Oral     SpO2 08/16/23 1002 98 %     Weight --      Height --      Head Circumference --      Peak Flow --      Pain Score 08/16/23 1003 7     Pain Loc --      Pain Education --      Exclude from Growth Chart --     Most recent vital signs: Vitals:   08/16/23 1002 08/16/23 1510  BP: (!) 172/70 (!) 156/68  Pulse: 66 81  Resp: 20 18  Temp: 97.9 F (36.6 C) 98.4 F (36.9 C)  SpO2: 98% 92%    General: Awake, no distress.  CV:  Good peripheral perfusion.  Resp:  Normal effort.  Abd:  No distention.  Other:  Focal right hip pain with attempt to flex.  Pain increases with internal rotation.  No shortening noted.   ED Results / Procedures / Treatments   Labs (all labs ordered are listed, but only abnormal results are displayed) Labs Reviewed  COMPREHENSIVE METABOLIC PANEL WITH GFR - Abnormal; Notable for the following components:      Result Value   Potassium 3.4 (*)    Glucose, Bld 129 (*)    All other  components within normal limits  CBC WITH DIFFERENTIAL/PLATELET - Abnormal; Notable for the following components:   WBC 26.1 (*)    RBC 5.14 (*)    HCT 46.2 (*)    Neutro Abs 22.5 (*)    Monocytes Absolute 1.6 (*)    Abs Immature Granulocytes 0.47 (*)    All other components within normal limits  APTT - Abnormal; Notable for the following components:   aPTT 39 (*)    All other components within normal limits  TYPE AND SCREEN     EKG  Not indicated   RADIOLOGY  Image and radiology report reviewed and interpreted by me. Radiology report consistent with the same.  X-ray of the right hip negative for acute concerns.  CT of the head and cervical spine negative for acute findings.  CT right hip shows a cortical and trabecular lucency along the right posterior femoral head and neck junction potentially reflecting a nondisplaced fracture.  PROCEDURES:  Critical Care performed: No  Procedures   IMPRESSION / MDM / ASSESSMENT AND PLAN / ED COURSE   I have reviewed the triage note.  Differential diagnosis includes, but is not limited to, ICH, cervical vertebral injury, right hip strain, right hip fracture.  Patient's presentation is most consistent with acute complicated illness / injury requiring diagnostic workup.  89 year old female presenting to the emergency department for treatment evaluation after falling from a seated position off of a stool in the bathroom this morning while reaching for her lipstick.  See HPI.  After she fell, she was able to stand with assistance and was then able to continue with her family to a restaurant.  After eating, she began having increasing pain in the hip and had great difficulty getting into/out of the car.  X-ray imaging was inconclusive for fracture.  CT ordered which shows a possible nondisplaced fracture at the posterior femoral head and neck junction.  Upon reassessment, the patient is still unable to tolerate movement of the hip even  after pain medication.  Plan will be to admit her via hospitalist service and consult orthopedics.  Imaging results and plan for admission discussed with the patient and family.  Consulted with Dr Daun Epstein of orthopedic surgery who will come see the patient this afternoon.  Patient accepted for admission by hospitalist service.      FINAL CLINICAL IMPRESSION(S) / ED DIAGNOSES   Final diagnoses:  Acute hip pain, right     Rx / DC Orders   ED Discharge Orders     None        Note:  This document was prepared using Dragon voice recognition software and may include unintentional dictation errors.   Sherryle Don, FNP 08/16/23 1520    Bryson Carbine, MD 08/16/23 [phone]

## 2023-08-16 NOTE — Anesthesia Preprocedure Evaluation (Signed)
 Anesthesia Evaluation  Patient identified by MRN, date of birth, ID band Patient awake    Reviewed: Allergy & Precautions, H&P , NPO status , Patient's Chart, lab work & pertinent test results, reviewed documented beta blocker date and time   Airway Mallampati: II  TM Distance: >3 FB Neck ROM: full    Dental  (+) Teeth Intact   Pulmonary COPD   Pulmonary exam normal        Cardiovascular Exercise Tolerance: Poor hypertension, On Medications +CHF  Atrial Fibrillation  Rhythm:regular Rate:Normal     Neuro/Psych negative neurological ROS  negative psych ROS   GI/Hepatic Neg liver ROS,GERD  Medicated,,  Endo/Other  negative endocrine ROS    Renal/GU negative Renal ROS  negative genitourinary   Musculoskeletal   Abdominal   Peds  Hematology negative hematology ROS (+)   Anesthesia Other Findings Past Medical History: No date: Acquired thrombophilia (HCC) 06/19/2020: Acute metabolic encephalopathy No date: Allergy No date: Atherosclerosis of aorta (HCC) No date: Atrial fibrillation (HCC) No date: Bronchiectasis (HCC) No date: Cancer [facility])     Comment:  left breast cancer  No date: Chronic nausea No date: COPD (chronic obstructive pulmonary disease) (HCC) No date: GERD (gastroesophageal reflux disease) No date: Grade II diastolic dysfunction No date: Hyperlipemia No date: Hypertension 06/20/2018: Intractable nausea and vomiting No date: Mild concentric left ventricular hypertrophy (LVH) No date: Mild mitral stenosis by prior echocardiogram No date: Mild pulmonary hypertension (HCC) No date: Psoriatic arthritis (HCC) No date: Stage 2 moderate COPD by GOLD classification [facility]) Past Surgical History: 2008: BREAST LUMPECTOMY; Left 02/13/2023: CATARACT EXTRACTION W/PHACO; Left     Comment:  Procedure: CATARACT EXTRACTION PHACO AND INTRAOCULAR               LENS PLACEMENT (IOC) LEFT  KDB GONIOTOMY;  Surgeon:                Trudi Fus, MD;  Location: [facility];  Service: Ophthalmology;  Laterality: Left;                10.53 0:55. 02/27/2023: CATARACT EXTRACTION W/PHACO; Right     Comment:  Procedure: CATARACT EXTRACTION PHACO AND INTRAOCULAR               LENS PLACEMENT (IOC) RIGHT  [place], goniotomy 15.90               01:28.6;  Surgeon: Trudi Fus, MD;  Location:              [facility];  Service: Ophthalmology;                Laterality: Right; No date: CHOLECYSTECTOMY 06/05/2020: HIP PINNING,CANNULATED; Left     Comment:  Procedure: CANNULATED HIP PINNING;  Surgeon: Molli Angelucci, MD;  Location: [facility] ORS;  Service: Orthopedics;               Laterality: Left; No date: PARTIAL HYSTERECTOMY 06/10/2020: TEE WITHOUT CARDIOVERSION; N/A     Comment:  Procedure: TRANSESOPHAGEAL ECHOCARDIOGRAM (TEE);                Surgeon: Constancia Delton, MD;  Location: [facility] ORS;                Service: Cardiovascular;  Laterality: N/A;   Reproductive/Obstetrics negative OB ROS  Anesthesia Physical Anesthesia Plan  ASA: 3 and emergent  Anesthesia Plan: General ETT   Post-op Pain Management:    Induction:   PONV Risk Score and Plan: 4 or greater  Airway Management Planned:   Additional Equipment:   Intra-op Plan:   Post-operative Plan:   Informed Consent: I have reviewed the patients History and Physical, chart, labs and discussed the procedure including the risks, benefits and alternatives for the proposed anesthesia with the patient or authorized representative who has indicated his/her understanding and acceptance.     Dental Advisory Given  Plan Discussed with: CRNA  Anesthesia Plan Comments:        Anesthesia Quick Evaluation

## 2023-08-16 NOTE — Telephone Encounter (Signed)
 Unable to reach patient or daughter Olivia Mount RN Navigator Cardiac Imaging [facility] [phone] Office  [phone] Cell

## 2023-08-16 NOTE — H&P (Addendum)
 History and Physical    Patient: [name] ZHY:[id number] DOB: 1933-11-22 DOA: 08/16/2023 DOS: the patient was seen and examined on 08/16/2023 PCP: Sari Cunning, MD  Patient coming from: Home  Chief Complaint:  Chief Complaint  Patient presents with   Fall   HPI: [name] is a 89 y.o. female with medical history significant of chronic atrial fibrillation on anticoagulation with Pradaxa , history of asthma/COPD, [name], hypertension comes to the emergency room after she had an accidental fall at home while she was trying to grab a lipstick sitting on the stool in the bathroom and slid off the stool is a tilted. Patient did hit her head however did not lose consciousness. Thereafter patient started having pain in her right hip. She was brought to the emergency room where x-ray did not show any acute fracture.  CT head CT cervical spine No CT evidence of acute intracranial abnormality.  No acute fracture or traumatic malalignment of the cervical spine.  Chronic appearing deformity of the medial left clavicle.  Redemonstrated chronic microvascular ischemic changes.  Degenerative changes of the cervical spine as above.   CT of the right hip showed subtle region of cortical and trabecular lucency along the right posterior femoral head and neck junction may reflect a nondisplaced fracture. The remainder of the visualized bones are intact 2. Moderate osteoarthritis of the right hip.  Orthopedic consultation was obtained with Dr.Poggi and he recommended MRI of the right hip which is still pending. Patient is in significant pain unable to walk. She is being admitted for acute right hip pain following fall possible right hip fracture.  Patient took her Pradaxa  this morning.  Patient ambulates by herself. She is otherwise quite functional. She has had no complications with previous surgeries. Denies any chest pain or shortness of breath.  Review of Systems: As mentioned in the history of  present illness. All other systems reviewed and are negative. Past Medical History:  Diagnosis Date   Acquired thrombophilia (HCC)    Acute metabolic encephalopathy 06/19/2020   Allergy    Atherosclerosis of aorta (HCC)    Atrial fibrillation (HCC)    Bronchiectasis (HCC)    Cancer (HCC)    left breast cancer    Chronic nausea    COPD (chronic obstructive pulmonary disease) (HCC)    GERD (gastroesophageal reflux disease)    Grade II diastolic dysfunction    Hyperlipemia    Hypertension    Intractable nausea and vomiting 06/20/2018   Mild concentric left ventricular hypertrophy (LVH)    Mild mitral stenosis by prior echocardiogram    Mild pulmonary hypertension (HCC)    Psoriatic arthritis (HCC)    Stage 2 moderate COPD by GOLD classification [facility])    Past Surgical History:  Procedure Laterality Date   BREAST LUMPECTOMY Left 2008   CATARACT EXTRACTION W/PHACO Left 02/13/2023   Procedure: CATARACT EXTRACTION PHACO AND INTRAOCULAR LENS PLACEMENT (IOC) LEFT  KDB GONIOTOMY;  Surgeon: Trudi Fus, MD;  Location: [facility] [place] [facility];  Service: Ophthalmology;  Laterality: Left;  10.53 0:55.   CATARACT EXTRACTION W/PHACO Right 02/27/2023   Procedure: CATARACT EXTRACTION PHACO AND INTRAOCULAR LENS PLACEMENT (IOC) RIGHT  [place], goniotomy 15.90 01:28.6;  Surgeon: Trudi Fus, MD;  Location: [facility];  Service: Ophthalmology;  Laterality: Right;   CHOLECYSTECTOMY     HIP PINNING,CANNULATED Left 06/05/2020   Procedure: CANNULATED HIP PINNING;  Surgeon: Molli Angelucci, MD;  Location: [facility];  Service: Orthopedics;  Laterality: Left;  PARTIAL HYSTERECTOMY     TEE WITHOUT CARDIOVERSION N/A 06/10/2020   Procedure: TRANSESOPHAGEAL ECHOCARDIOGRAM (TEE);  Surgeon: Constancia Delton, MD;  Location: [facility];  Service: Cardiovascular;  Laterality: N/A;   Social History:  reports that she has never smoked. She has never used smokeless tobacco. She reports that she does  not drink alcohol and does not use drugs.  Allergies  Allergen Reactions   Amoxicillin  Nausea Only   Eliquis  [Apixaban ] Nausea Only    Family History  Problem Relation Age of Onset   Breast cancer Sister     Prior to Admission medications   Medication Sig Start Date End Date Taking? Authorizing Provider  hydrochlorothiazide  (HYDRODIURIL ) 25 MG tablet Take 1 tablet by mouth daily. 08/10/23 08/09/24 Yes [provider]  amiodarone  (PACERONE ) 200 MG tablet Take 2 tablets (400 mg total) by mouth 2 (two) times daily for 7 days, THEN 1 tablet (200 mg total) 2 (two) times daily for 7 days, THEN 1 tablet (200 mg total) daily. 07/21/23 09/03/23  Donaciano Frizzle, MD  dabigatran  (PRADAXA ) 150 MG CAPS capsule Take 1 capsule (150 mg total) by mouth every 12 (twelve) hours. 07/21/23   Donaciano Frizzle, MD  Fluticasone -Umeclidin-Vilant (TRELEGY ELLIPTA) 100-62.5-25 MCG/ACT AEPB Inhale 1 puff into the lungs daily.    [provider]  folic acid  (FOLVITE ) 1 MG tablet Take 1 mg by mouth daily. Pt takes this med the days she does not take methotrexate  07/13/21   [provider]  furosemide  (LASIX ) 40 MG tablet Take 1 tablet (40 mg total) by mouth daily. 07/22/23   Donaciano Frizzle, MD  LORazepam  (ATIVAN ) 0.5 MG tablet Take 0.5 mg by mouth in the morning. 06/21/23 09/19/23  [provider]  methotrexate  (RHEUMATREX) 2.5 MG tablet Take 5 mg by mouth once a week. Caution:Chemotherapy. Protect from light. Pt take 2 weekly.    [provider]  metoprolol  tartrate (LOPRESSOR ) 25 MG tablet Take 1.5 tablets (37.5 mg total) by mouth 2 (two) times daily. 07/21/23   Donaciano Frizzle, MD  omeprazole  (PRILOSEC) 40 MG capsule Take 40 mg by mouth daily.    [provider]    Physical Exam: Vitals:   08/16/23 1002  BP: (!) 172/70  Pulse: 66  Resp: 20  Temp: 97.9 F (36.6 C)  TempSrc: Oral  SpO2: 98%   Physical Exam Constitutional:      Appearance: Normal appearance.  HENT:     Head:  Normocephalic and atraumatic.  Eyes:     Pupils: Pupils are equal, round, and reactive to light.  Cardiovascular:     Rate and Rhythm: Normal rate and regular rhythm.  Pulmonary:     Effort: Pulmonary effort is normal.     Breath sounds: Normal breath sounds.  Abdominal:     General: Abdomen is flat.     Palpations: Abdomen is soft.  Musculoskeletal:        General: Tenderness present.  Skin:    General: Skin is warm.  Neurological:     General: No focal deficit present.     Mental Status: She is alert and oriented to person, place, and time.     Assessment and Plan:  [name] is a 89 y.o. female with medical history significant of chronic atrial fibrillation on anticoagulation with Pradaxa , history of asthma/COPD, [name], hypertension comes to the emergency room after she had an accidental fall at home while she was trying to grab a lipstick sitting on the stool in the bathroom and slid  off the stool is a tilted. Patient did hit her head however did not lose consciousness.  Mechanical fall with acute right hip pain suspected femoral/hip fracture -- MRI hip pending -- orthopedic consultation with Dr.Poggi -- PO and PRN IV pain meds -- hold DVT prophylaxis till final decision made by orthopedic. --Medically pt is best optimized for surgery. She is at low-intermediate risk  History of chronic atrial fibrillation -- continue amiodarone  and metoprolol  -- hold Pradaxa -- patient may need surgery pending MRI  Hypertension -- continue metoprolol  and hydrochlorothiazide   Asthma/COPD -- sats stable -- inhalers/nebulizer as needed  Elevated white count -- appears reactive. So far no source of infection identified. Will follow CBC    Advance Care Planning:   Code Status: Full Code   Consults: ortho  Family Communication: husband in the ER  Severity of Illness: The appropriate patient status for this patient is INPATIENT. Inpatient status is judged to be reasonable and  necessary in order to provide the required intensity of service to ensure the patient's safety. The patient's presenting symptoms, physical exam findings, and initial radiographic and laboratory data in the context of their chronic comorbidities is felt to place them at high risk for further clinical deterioration. Furthermore, it is not anticipated that the patient will be medically stable for discharge from the hospital within 2 midnights of admission.   * I certify that at the point of admission it is my clinical judgment that the patient will require inpatient hospital care spanning beyond 2 midnights from the point of admission due to high intensity of service, high risk for further deterioration and high frequency of surveillance required.*  Author: Melvinia Stager, MD 08/16/2023 2:15 PM  For on call review [email or web address].

## 2023-08-16 NOTE — Consult Note (Addendum)
 ORTHOPAEDIC CONSULTATION  REQUESTING PHYSICIAN: Melvinia Stager, MD  Chief Complaint:   Right hip pain.  History of Present Illness: [name] is a 89 y.o. female with multiple medical problems including a recent diagnosis of atrial fibrillation for which she has now on Pradaxa , hypertension, mild pulmonary hypertension, COPD, hyperlipidemia, gastroesophageal reflux disease, and mild mitral stenosis who normally lives independently.  The patient was in her usual state of health this morning when she apparently lost her balance and fell off a stool in her bathroom this morning while reaching out to grab her lipstick off the counter, landing on her right hip.  She noted increased pain when trying to stand up and so was brought to the emergency room where x-rays and subsequently CT scan were performed.  The CT scan was concerning for a minimally impacted fracture through the subcapital region of the right hip, so orthopedic consultation was requested.  The patient denies any associated injuries.  She did bump her head when she fell, but did not become lightheaded or lose consciousness.  The patient also denies any lightheadedness, dizziness, chest pain, shortness of breath, or other symptoms which may have precipitated her fall.  She is status post an in situ cannulated screw fixation of a valgus impacted left femoral neck fracture 3 years ago from which she has done quite well.  Past Medical History:  Diagnosis Date   Acquired thrombophilia (HCC)    Acute metabolic encephalopathy 06/19/2020   Allergy    Atherosclerosis of aorta (HCC)    Atrial fibrillation (HCC)    Bronchiectasis (HCC)    Cancer (HCC)    left breast cancer    Chronic nausea    COPD (chronic obstructive pulmonary disease) (HCC)    GERD (gastroesophageal reflux disease)    Grade II diastolic dysfunction    Hyperlipemia    Hypertension    Intractable nausea and  vomiting 06/20/2018   Mild concentric left ventricular hypertrophy (LVH)    Mild mitral stenosis by prior echocardiogram    Mild pulmonary hypertension (HCC)    Psoriatic arthritis (HCC)    Stage 2 moderate COPD by GOLD classification [facility])    Past Surgical History:  Procedure Laterality Date   BREAST LUMPECTOMY Left 2008   CATARACT EXTRACTION W/PHACO Left 02/13/2023   Procedure: CATARACT EXTRACTION PHACO AND INTRAOCULAR LENS PLACEMENT (IOC) LEFT  KDB GONIOTOMY;  Surgeon: Trudi Fus, MD;  Location: [facility];  Service: Ophthalmology;  Laterality: Left;  10.53 0:55.   CATARACT EXTRACTION W/PHACO Right 02/27/2023   Procedure: CATARACT EXTRACTION PHACO AND INTRAOCULAR LENS PLACEMENT (IOC) RIGHT  [place], goniotomy 15.90 01:28.6;  Surgeon: Trudi Fus, MD;  Location: [facility];  Service: Ophthalmology;  Laterality: Right;   CHOLECYSTECTOMY     HIP PINNING,CANNULATED Left 06/05/2020   Procedure: CANNULATED HIP PINNING;  Surgeon: Molli Angelucci, MD;  Location: [facility];  Service: Orthopedics;  Laterality: Left;   PARTIAL HYSTERECTOMY     TEE WITHOUT CARDIOVERSION N/A 06/10/2020   Procedure: TRANSESOPHAGEAL ECHOCARDIOGRAM (TEE);  Surgeon: Constancia Delton, MD;  Location: [facility];  Service: Cardiovascular;  Laterality: N/A;   Social History   Socioeconomic History   Marital status: Married    Spouse name: Not on file   Number of children: 2   Years of education: Not on file   Highest education level: Not on file  Occupational History   Not on file  Tobacco Use   Smoking status: Never   Smokeless tobacco: Never  Vaping  Use   Vaping status: Never Used  Substance and Sexual Activity   Alcohol use: No   Drug use: No   Sexual activity: Not on file  Other Topics Concern   Not on file  Social History Narrative   Not on file   Social Drivers of Health   Financial Resource Strain: Low Risk  (08/10/2023)   Received from [facility]    Overall Financial Resource Strain (CARDIA)    Difficulty of Paying Living Expenses: Not hard at all  Food Insecurity: No Food Insecurity (08/10/2023)   Received from [facility]   Hunger Vital Sign    Worried About Running Out of Food in the Last Year: Never true    Ran Out of Food in the Last Year: Never true  Transportation Needs: No Transportation Needs (08/10/2023)   Received from [facility] - [facility]    In the past 12 months, has lack of transportation kept you from medical appointments or from getting medications?: No    Lack of Transportation (Non-Medical): No  Physical Activity: Sufficiently Active (02/17/2021)   Exercise Vital Sign    Days of Exercise per Week: 7 days    Minutes of Exercise per Session: 30 min  Stress: No Stress Concern Present (02/17/2021)   [employer] of Occupational Health - Occupational Stress Questionnaire    Feeling of Stress : Not at all  Social Connections: Moderately Isolated (07/18/2023)   Social Connection and Isolation Panel [NHANES]    Frequency of Communication with Friends and Family: More than three times a week    Frequency of Social Gatherings with Friends and Family: More than three times a week    Attends Religious Services: Never    Database administrator or Organizations: No    Attends Engineer, structural: Never    Marital Status: Married   Family History  Problem Relation Age of Onset   Breast cancer Sister    Allergies  Allergen Reactions   Amoxicillin  Nausea Only   Eliquis  [Apixaban ] Nausea Only   Prior to Admission medications   Medication Sig Start Date End Date Taking? Authorizing Provider  amiodarone  (PACERONE ) 200 MG tablet Take 2 tablets (400 mg total) by mouth 2 (two) times daily for 7 days, THEN 1 tablet (200 mg total) 2 (two) times daily for 7 days, THEN 1 tablet (200 mg total) daily. 07/21/23 09/03/23 Yes Donaciano Frizzle, MD  dabigatran  (PRADAXA ) 150 MG  CAPS capsule Take 1 capsule (150 mg total) by mouth every 12 (twelve) hours. 07/21/23  Yes Donaciano Frizzle, MD  Fluticasone -Umeclidin-Vilant (TRELEGY ELLIPTA) 100-62.5-25 MCG/ACT AEPB Inhale 1 puff into the lungs daily.   Yes [provider]  folic acid  (FOLVITE ) 1 MG tablet Take 1 mg by mouth daily. Pt takes this med the days she does not take methotrexate  07/13/21  Yes [provider]  furosemide  (LASIX ) 40 MG tablet Take 1 tablet (40 mg total) by mouth daily. 07/22/23  Yes Donaciano Frizzle, MD  hydrochlorothiazide  (HYDRODIURIL ) 25 MG tablet Take 1 tablet by mouth daily. 08/10/23 08/09/24 Yes [provider]  LORazepam  (ATIVAN ) 0.5 MG tablet Take 0.5 mg by mouth in the morning. 06/21/23 09/19/23 Yes [provider]  methotrexate  (RHEUMATREX) 2.5 MG tablet Take 5 mg by mouth once a week. Caution:Chemotherapy. Protect from light. Pt take 2 weekly.   Yes [provider]  metoprolol  tartrate (LOPRESSOR ) 25 MG tablet Take 1.5 tablets (37.5 mg total) by  mouth 2 (two) times daily. 07/21/23  Yes Donaciano Frizzle, MD  omeprazole  (PRILOSEC) 40 MG capsule Take 40 mg by mouth daily.   Yes [provider]  ondansetron  (ZOFRAN ) 4 MG tablet Take 4 mg by mouth every 8 (eight) hours as needed for nausea or vomiting.   Yes [provider]   CT Hip Right Wo Contrast Result Date: 08/16/2023 CLINICAL DATA:  Right hip pain after fall. EXAM: CT OF THE RIGHT HIP WITHOUT CONTRAST TECHNIQUE: Multidetector CT imaging of the right hip was performed according to the standard protocol. Multiplanar CT image reconstructions were also generated. RADIATION DOSE REDUCTION: This exam was performed according to the departmental dose-optimization program which includes automated exposure control, adjustment of the mA and/or kV according to patient size and/or use of iterative reconstruction technique. COMPARISON:  Same day radiographs of the right hip dated 08/16/2023. FINDINGS:  Bones/Joint/Cartilage Subtle region of cortical and trabecular lucency along the posterior femoral head and neck junction (series 7, image 60), may reflect a nondisplaced fracture. The remainder of the visualized bones are intact. The right femoral head is seated within the acetabulum. Moderate osteoarthritis of the right hip manifested by joint space narrowing and marginal osteophytosis. Ligaments Ligaments are suboptimally evaluated by CT. Muscles and Tendons Muscles are within normal limits. No intramuscular fluid collection or hematoma. Soft tissue No fluid collection or hematoma. No soft tissue mass. Small fat containing right inguinal hernia. IMPRESSION: 1. Subtle region of cortical and trabecular lucency along the right posterior femoral head and neck junction may reflect a nondisplaced fracture. The remainder of the visualized bones are intact 2. Moderate osteoarthritis of the right hip. Electronically Signed   By: Mannie Seek M.D.   On: 08/16/2023 12:33   DG Hip Unilat W or Wo Pelvis 2-3 Views Right Result Date: 08/16/2023 CLINICAL DATA:  Right hip pain after fall today. EXAM: DG HIP (WITH OR WITHOUT PELVIS) 2-3V RIGHT COMPARISON:  None Available. FINDINGS: There is no evidence of hip fracture or dislocation. There is no evidence of arthropathy or other focal bone abnormality. IMPRESSION: Negative. Electronically Signed   By: Rosalene Colon M.D.   On: 08/16/2023 11:16   CT Head Wo Contrast Result Date: 08/16/2023 CLINICAL DATA:  Trauma, fall, fell off of stool and hit right-side of head. EXAM: CT HEAD WITHOUT CONTRAST CT CERVICAL SPINE WITHOUT CONTRAST TECHNIQUE: Multidetector CT imaging of the head and cervical spine was performed following the standard protocol without intravenous contrast. Multiplanar CT image reconstructions of the cervical spine were also generated. RADIATION DOSE REDUCTION: This exam was performed according to the departmental dose-optimization program which includes  automated exposure control, adjustment of the mA and/or kV according to patient size and/or use of iterative reconstruction technique. COMPARISON:  CT head 06/05/2020. FINDINGS: CT HEAD FINDINGS Brain: No acute intracranial hemorrhage. Redemonstrated remote lacunar infarct versus prominent perivascular space in the inferior right basal ganglia. Increased heterogeneity of the basal ganglia particularly on the left likely related to chronic microvascular changes. Nonspecific hypoattenuation in the periventricular and subcortical white matter favored to reflect chronic microvascular ischemic changes. Mild parenchymal volume loss. No edema, mass effect, or midline shift. The basilar cisterns are patent. Ventricles: The ventricles are normal. Vascular: Atherosclerotic calcifications of the carotid siphons and intracranial vertebral arteries. No hyperdense vessel. Skull: No acute or aggressive finding. Orbits: Orbits are symmetric. Sinuses: The visualized paranasal sinuses are clear. Other: Mastoid air cells are clear. CT CERVICAL SPINE FINDINGS Alignment: Cervical lordosis is maintained. Trace retrolisthesis of  C4 on C5. No facet subluxation or dislocation. Skull base and vertebrae: No compression fracture or displaced fracture in the cervical spine. No suspicious osseous lesion. Chronic appearing deformity of the medial left clavicle. Soft tissues and spinal canal: No prevertebral fluid or swelling. No visible canal hematoma. Disc levels: Intervertebral disc space narrowing greatest at C4-5. Disc osteophyte complexes at multiple levels. No high-grade osseous spinal canal stenosis. Facet arthrosis and uncovertebral hypertrophy throughout the cervical spine. Foraminal stenosis most pronounced on the right at C6-7. Upper chest: Biapical pleuroparenchymal scarring and pleural calcifications. Other: None IMPRESSION: No CT evidence of acute intracranial abnormality. No acute fracture or traumatic malalignment of the cervical  spine. Chronic appearing deformity of the medial left clavicle. Redemonstrated chronic microvascular ischemic changes. Degenerative changes of the cervical spine as above. Electronically Signed   By: Denny Flack M.D.   On: 08/16/2023 11:07   CT Cervical Spine Wo Contrast Result Date: 08/16/2023 CLINICAL DATA:  Trauma, fall, fell off of stool and hit right-side of head. EXAM: CT HEAD WITHOUT CONTRAST CT CERVICAL SPINE WITHOUT CONTRAST TECHNIQUE: Multidetector CT imaging of the head and cervical spine was performed following the standard protocol without intravenous contrast. Multiplanar CT image reconstructions of the cervical spine were also generated. RADIATION DOSE REDUCTION: This exam was performed according to the departmental dose-optimization program which includes automated exposure control, adjustment of the mA and/or kV according to patient size and/or use of iterative reconstruction technique. COMPARISON:  CT head 06/05/2020. FINDINGS: CT HEAD FINDINGS Brain: No acute intracranial hemorrhage. Redemonstrated remote lacunar infarct versus prominent perivascular space in the inferior right basal ganglia. Increased heterogeneity of the basal ganglia particularly on the left likely related to chronic microvascular changes. Nonspecific hypoattenuation in the periventricular and subcortical white matter favored to reflect chronic microvascular ischemic changes. Mild parenchymal volume loss. No edema, mass effect, or midline shift. The basilar cisterns are patent. Ventricles: The ventricles are normal. Vascular: Atherosclerotic calcifications of the carotid siphons and intracranial vertebral arteries. No hyperdense vessel. Skull: No acute or aggressive finding. Orbits: Orbits are symmetric. Sinuses: The visualized paranasal sinuses are clear. Other: Mastoid air cells are clear. CT CERVICAL SPINE FINDINGS Alignment: Cervical lordosis is maintained. Trace retrolisthesis of C4 on C5. No facet subluxation or  dislocation. Skull base and vertebrae: No compression fracture or displaced fracture in the cervical spine. No suspicious osseous lesion. Chronic appearing deformity of the medial left clavicle. Soft tissues and spinal canal: No prevertebral fluid or swelling. No visible canal hematoma. Disc levels: Intervertebral disc space narrowing greatest at C4-5. Disc osteophyte complexes at multiple levels. No high-grade osseous spinal canal stenosis. Facet arthrosis and uncovertebral hypertrophy throughout the cervical spine. Foraminal stenosis most pronounced on the right at C6-7. Upper chest: Biapical pleuroparenchymal scarring and pleural calcifications. Other: None IMPRESSION: No CT evidence of acute intracranial abnormality. No acute fracture or traumatic malalignment of the cervical spine. Chronic appearing deformity of the medial left clavicle. Redemonstrated chronic microvascular ischemic changes. Degenerative changes of the cervical spine as above. Electronically Signed   By: Denny Flack M.D.   On: 08/16/2023 11:07    Positive ROS: All other systems have been reviewed and were otherwise negative with the exception of those mentioned in the HPI and as above.  Physical Exam: General:  Alert, no acute distress Psychiatric:  Patient appears to be competent for consent with normal mood and affect   Cardiovascular:  No pedal edema Respiratory:  No wheezing, non-labored breathing GI:  Abdomen is soft and non-tender  Skin:  No lesions in the area of chief complaint Neurologic:  Sensation intact distally Lymphatic:  No axillary or cervical lymphadenopathy  Orthopedic Exam:  Orthopedic examination is limited to the right hip and lower extremity.  The right lower extremity is symmetrically aligned when compared to the right.  Skin inspection around the right hip is unremarkable.  No swelling, erythema, ecchymosis, abrasions, or other skin abnormalities are identified.  She has mild tenderness to palpation over  the anterolateral aspect of the hip.  She has more severe pain with any attempted active or passive motion of the hip.  She is grossly neurovascularly intact to the right lower extremity and foot.  X-rays:  Recent x-rays and CT scan of the pelvis and right hip are available for review and have been reviewed by myself.  These films demonstrate what appears to be a minimally impacted subcapital fracture of the right hip.  No significant degenerative changes of the right hip joint are noted.  No other lytic lesions or acute bony abnormalities are identified.  Of note, the patient is status post a cannulated screw fixation of a similar injury on the left which appears to be well-healed.  Assessment: Valgus impacted right femoral neck fracture.  Plan: The treatment options, including both surgical and nonsurgical choices, have been discussed in detail with the patient and her family who is at the bedside.  The patient and her family would like to proceed with surgical intervention to include an in situ cannulated screw fixation of the valgus impacted right femoral neck fracture.  The risks (including bleeding, infection, nerve and/or blood vessel injury, persistent or recurrent pain, malunion and/or nonunion, stiffness of the hip, need for further surgery, blood clots, strokes, heart attacks or arrhythmias, pneumonia, etc.) and benefits of the surgical procedure were discussed.  The patient states her understanding and agrees to proceed.  A formal written consent will be obtained by the nursing staff.  Thank you for asking me to participate in the care of this most pleasant and unfortunate woman.  I will be happy to follow her with you.   Lonnie Roberts, MD  Beeper #:  [phone]  08/16/2023 2:30 PM

## 2023-08-17 ENCOUNTER — Encounter: Payer: Self-pay | Admitting: Surgery

## 2023-08-17 ENCOUNTER — Ambulatory Visit

## 2023-08-17 DIAGNOSIS — S7291XS Unspecified fracture of right femur, sequela: Secondary | ICD-10-CM | POA: Diagnosis not present

## 2023-08-17 DIAGNOSIS — J449 Chronic obstructive pulmonary disease, unspecified: Secondary | ICD-10-CM | POA: Diagnosis not present

## 2023-08-17 DIAGNOSIS — M25551 Pain in right hip: Secondary | ICD-10-CM | POA: Diagnosis not present

## 2023-08-17 DIAGNOSIS — I10 Essential (primary) hypertension: Secondary | ICD-10-CM | POA: Diagnosis not present

## 2023-08-17 LAB — CBC
HCT: 37.1 % (ref 36.0–46.0)
Hemoglobin: 12.5 g/dL (ref 12.0–15.0)
MCH: 29.6 pg (ref 26.0–34.0)
MCHC: 33.7 g/dL (ref 30.0–36.0)
MCV: 87.7 fL (ref 80.0–100.0)
Platelets: 228 10*3/uL (ref 150–400)
RBC: 4.23 MIL/uL (ref 3.87–5.11)
RDW: 14.6 % (ref 11.5–15.5)
WBC: 18.2 10*3/uL — ABNORMAL HIGH (ref 4.0–10.5)
nRBC: 0 % (ref 0.0–0.2)

## 2023-08-17 LAB — BASIC METABOLIC PANEL WITH GFR
Anion gap: 11 (ref 5–15)
BUN: 16 mg/dL (ref 8–23)
CO2: 25 mmol/L (ref 22–32)
Calcium: 8.1 mg/dL — ABNORMAL LOW (ref 8.9–10.3)
Chloride: 101 mmol/L (ref 98–111)
Creatinine, Ser: 0.72 mg/dL (ref 0.44–1.00)
GFR, Estimated: >60 mL/min (ref >60)
Glucose, Bld: 170 mg/dL — ABNORMAL HIGH (ref 70–99)
Potassium: 3.4 mmol/L — ABNORMAL LOW (ref 3.5–5.1)
Sodium: 137 mmol/L (ref 135–145)

## 2023-08-17 MED ORDER — POTASSIUM CHLORIDE CRYS ER 20 MEQ PO TBCR
40.0000 meq | EXTENDED_RELEASE_TABLET | Freq: Once | ORAL | Status: AC
  Administered 2023-08-17: 40 meq via ORAL
  Filled 2023-08-17: qty 2

## 2023-08-17 NOTE — Progress Notes (Signed)
 Subjective: 1 Day Post-Op Procedure(s) (LRB): FIXATION, FEMUR, NECK, PERCUTANEOUS, USING SCREW (Right) Patient reports pain as mild.   Patient is well, and has had no acute complaints or problems PT and care management to assist with discharge planning. Negative for chest pain and shortness of breath Fever: no Gastrointestinal:Negative for nausea and vomiting Reports she is passing some gas.  No BM yet.  Objective: Vital signs in last 24 hours: Temp:  [97.5 F (36.4 C)-98.8 F (37.1 C)] 97.9 F (36.6 C) (06/12 0745) Pulse Rate:  [62-81] 67 (06/12 0745) Resp:  [14-20] 17 (06/12 0745) BP: (91-172)/(32-69) 110/53 (06/12 0745) SpO2:  [92 %-100 %] 97 % (06/12 0745) Weight:  [52.2 kg-52.9 kg] 52.2 kg (06/11 1609)  Intake/Output from previous day:  Intake/Output Summary (Last 24 hours) at 08/17/2023 1020 Last data filed at 08/17/2023 0841 Gross per 24 hour  Intake 775 ml  Output 910 ml  Net -135 ml    Intake/Output this shift: Total I/O In: 200 [IV Piggyback:200] Out: 900 [Urine:900]  Labs: Recent Labs    08/16/23 1307 08/17/23 0326  HGB 15.0 12.5   Recent Labs    08/16/23 1307 08/17/23 0326  WBC 26.1* 18.2*  RBC 5.14* 4.23  HCT 46.2* 37.1  PLT 248 228   Recent Labs    08/16/23 1307 08/17/23 0326  NA 140 137  K 3.4* 3.4*  CL 104 101  CO2 24 25  BUN 20 16  CREATININE 0.89 0.72  GLUCOSE 129* 170*  CALCIUM  8.9 8.1*   No results for input(s): LABPT, INR in the last 72 hours.   EXAM General - Patient is Alert, Appropriate, and Oriented Extremity - ABD soft Neurovascular intact Dorsiflexion/Plantar flexion intact Incision: moderate drainage No cellulitis present Compartment soft Dressing/Incision - Moderate bloody drainage noted to the right hip honeycomb dressing.  New honeycomb dressing applied. Motor Function - intact, moving foot and toes well on exam.  Abdomen soft with intact bowel sounds.  Past Medical History:  Diagnosis Date    Acquired thrombophilia (HCC)    Acute metabolic encephalopathy 06/19/2020   Allergy    Atherosclerosis of aorta (HCC)    Atrial fibrillation (HCC)    Bronchiectasis (HCC)    Cancer (HCC)    left breast cancer    Chronic nausea    COPD (chronic obstructive pulmonary disease) (HCC)    GERD (gastroesophageal reflux disease)    Grade II diastolic dysfunction    Hyperlipemia    Hypertension    Intractable nausea and vomiting 06/20/2018   Mild concentric left ventricular hypertrophy (LVH)    Mild mitral stenosis by prior echocardiogram    Mild pulmonary hypertension (HCC)    Psoriatic arthritis (HCC)    Stage 2 moderate COPD by GOLD classification (HCC)     Assessment/Plan: 1 Day Post-Op Procedure(s) (LRB): FIXATION, FEMUR, NECK, PERCUTANEOUS, USING SCREW (Right) Principal Problem:   Hip pain, acute, right Active Problems:   Primary hypertension   Chronic obstructive pulmonary disease (HCC)  Estimated body mass index is 21.03 kg/m as calculated from the following:   Height as of this encounter: 5' 2 (1.575 m).   Weight as of this encounter: 52.2 kg. Advance diet Up with therapy D/C IV fluids when tolerating po intake.  Labs and vitals reviewed.  WBC 18.2, down from 26.1 yesterday. Continue to wean off of O2 as tolerated. Up with PT.   Continue to work on a BM.  DVT Prophylaxis - TED hose and Pradaxa  50% weightbearing to the right  leg.  Antoine Bathe, PA-C [facility] Orthopaedic Surgery 08/17/2023, 10:20 AM

## 2023-08-17 NOTE — Evaluation (Signed)
 Physical Therapy Evaluation Patient Details Name: [name] MRN: [id number] DOB: 1934-02-16 Today's Date: 08/17/2023  History of Present Illness  [name] is a 89 y.o. female  with history of HTN, atrial fibrillation on Pradaxa , COPD and presented to the emergency department for treatment and evaluation of right hip pain after mechanical, nonsyncopal fall.Imaging showed valgus impacted R femoral neck fracture. Patient is s/p in situ cannulated screw fixation of fracture. Partial WB on RLE.     Clinical Impression  Orders Received, Chart Reviewed. Patient admitted with above diagnosis. Prior to admission, patient lives with spouse in town home. Was ambulatory with use of RW, and had assist from husband for ADLs. On evaluation, patient required MOD A for supine > sit, with increased time for transition and assist for RLE. Patient able to stand from bed with MOD A + 2 for lines/lead and safety. Patient require cues to maintain PWB precautions on RLE with mobility. Patient will benefit from skilled acute PT services to address functional impairments (see below for additional) and maximize functional mobility. Anticipate the need for follow up PT services upon acute hospital discharge. Patient will benefit from continued inpatient follow up therapy, <3 hours/day. Will continue to follow acutely.       If plan is discharge home, recommend the following: A lot of help with walking and/or transfers;Assist for transportation;Help with stairs or ramp for entrance   Can travel by private vehicle   No    Equipment Recommendations Other (comment) (TBD at next level of care)  Recommendations for Other Services       Functional Status Assessment Patient has had a recent decline in their functional status and demonstrates the ability to make significant improvements in function in a reasonable and predictable amount of time.     Precautions / Restrictions Precautions Precautions: Fall Recall  of Precautions/Restrictions: Impaired Restrictions Weight Bearing Restrictions Per Provider Order: Yes RLE Weight Bearing Per Provider Order: Partial weight bearing RLE Partial Weight Bearing Percentage or Pounds: 50%      Mobility  Bed Mobility Overal bed mobility: Needs Assistance Bed Mobility: Supine to Sit     Supine to sit: Mod assist     General bed mobility comments: Pt require MOD A for supine > sit; slow transition, intermittent assist for RLE and cues for technique    Transfers Overall transfer level: Needs assistance Equipment used: Rolling walker (2 wheels) Transfers: Sit to/from Stand, Bed to chair/wheelchair/BSC Sit to Stand: Mod assist, +2 safety/equipment   Step pivot transfers: Mod assist       General transfer comment: Educated on PWB RLE precautions, heavy UE reliance on RW to maintain. +2 for line / lead mgmt, MOD A to rise from bed. Pt with slow shuffled steps for transfer. Cues to maintain WBing precautions. Several standing bouts completed from lower recliner surface, cues for hand placement and to control descent.    Ambulation/Gait               General Gait Details: Pt able to lateral step, more shuffled steps to transition from bed > recliner. Mod A +2, heavy reliance on UE to maintain precautions. Pt denies pain  Stairs            Wheelchair Mobility     Tilt Bed    Modified Rankin (Stroke Patients Only)       Balance Overall balance assessment: Needs assistance Sitting-balance support: Single extremity supported, Feet supported Sitting balance-Leahy Scale: Fair  Standing balance support: Bilateral upper extremity supported, Reliant on assistive device for balance, During functional activity Standing balance-Leahy Scale: Poor Standing balance comment: increased UE support on RW                             Pertinent Vitals/Pain Pain Assessment Pain Assessment: No/denies pain    Home Living  Family/patient expects to be discharged to:: Private residence Living Arrangements: Spouse/significant other Available Help at Discharge: Family;Available 24 hours/day Type of Home: House Home Access: Stairs to enter Entrance Stairs-Rails: Right;Left;Can reach both Entrance Stairs-Number of Steps: 5   Home Layout: Able to live on main level with bedroom/bathroom;Two level Home Equipment: Grab bars - toilet;Grab bars - tub/shower;Cane - single Librarian, academic (2 wheels);Transport chair;Shower seat - built in      Prior Function Prior Level of Function : Independent/Modified Independent             Mobility Comments: Patient reports has been using a RW since had pneumonia approx 2 months ago. ADLs Comments: Reports husband assist with ADLs/bathings; assist into/out of shower.     Extremity/Trunk Assessment   Upper Extremity Assessment Upper Extremity Assessment: Defer to OT evaluation    Lower Extremity Assessment Lower Extremity Assessment: Generalized weakness;RLE deficits/detail RLE Deficits / Details: generalized weakness on RLE > LLE, s/p surgery RLE Sensation: WNL    Cervical / Trunk Assessment Cervical / Trunk Assessment: Normal  Communication   Communication Communication: No apparent difficulties    Cognition Arousal: Alert Behavior During Therapy: WFL for tasks assessed/performed   PT - Cognitive impairments: No apparent impairments                         Following commands: Intact       Cueing Cueing Techniques: Verbal cues, Tactile cues     General Comments General comments (skin integrity, edema, etc.): Pt on 1L O2 upon arrival with SpO2 97%, attempted to trial RA with SpO2 at 88%. Placed on 2L with SpO2 94% end of session.    Exercises     Assessment/Plan    PT Assessment Patient needs continued PT services  PT Problem List Decreased strength;Decreased range of motion;Decreased activity tolerance;Decreased balance;Decreased  mobility;Pain       PT Treatment Interventions DME instruction;Gait training;Stair training;Functional mobility training;Therapeutic activities;Therapeutic exercise;Balance training;Neuromuscular re-education;Patient/family education    PT Goals (Current goals can be found in the Care Plan section)  Acute Rehab PT Goals Patient Stated Goal: Get Home PT Goal Formulation: With patient Time For Goal Achievement: 08/31/23 Potential to Achieve Goals: Good    Frequency 7X/week     Co-evaluation PT/OT/SLP Co-Evaluation/Treatment: Yes Reason for Co-Treatment: For patient/therapist safety;To address functional/ADL transfers PT goals addressed during session: Mobility/safety with mobility;Balance OT goals addressed during session: ADL's and self-care       AM-PAC PT 6 Clicks Mobility  Outcome Measure Help needed turning from your back to your side while in a flat bed without using bedrails?: A Lot Help needed moving from lying on your back to sitting on the side of a flat bed without using bedrails?: A Lot Help needed moving to and from a bed to a chair (including a wheelchair)?: A Lot Help needed standing up from a chair using your arms (e.g., wheelchair or bedside chair)?: A Lot Help needed to walk in hospital room?: A Lot Help needed climbing 3-5 steps with a railing? : A Lot 6  Click Score: 12    End of Session Equipment Utilized During Treatment: Gait belt;Oxygen  Activity Tolerance: Patient tolerated treatment well Patient left: in chair;with call bell/phone within reach;with chair alarm set;with family/visitor present Nurse Communication: Mobility status PT Visit Diagnosis: Unsteadiness on feet (R26.81);Other abnormalities of gait and mobility (R26.89);History of falling (Z91.81);Muscle weakness (generalized) (M62.81);Difficulty in walking, not elsewhere classified (R26.2)    Time: 4098-1191 PT Time Calculation (min) (ACUTE ONLY): 34 min   Charges:   PT Evaluation $PT  Eval Moderate Complexity: 1 Mod   PT General Charges $$ ACUTE PT VISIT: 1 Visit         Quillian Brunt Fairly, PT, DPT 08/17/23 11:36 AM

## 2023-08-17 NOTE — Progress Notes (Addendum)
 [facility]  - [place] [facility]   PATIENT NAME: [name]    MR#:  [id number]  DATE OF BIRTH:  [birth year]  SUBJECTIVE:  husband at bedside. Patient tolerated procedure well. Denies any other issues. PT OT to work today. No issues per RN. Tolerating PO diet.   VITALS:  Blood pressure (!) 110/53, pulse 67, temperature 97.9 F (36.6 C), resp. rate 17, height 5' 2 (1.575 m), weight 52.2 kg, SpO2 97%.  PHYSICAL EXAMINATION:   GENERAL:  89 y.o.-year-old patient with no acute distress.  LUNGS: Normal breath sounds bilaterally, no wheezing CARDIOVASCULAR: S1, S2 normal. No murmur   ABDOMEN: Soft, nontender, nondistended. Bowel sounds present.  EXTREMITIES: No  edema b/l.   Surgical dressing+ NEUROLOGIC: nonfocal  patient is alert and awake  LABORATORY PANEL:  CBC Recent Labs  Lab 08/17/23 0326  WBC 18.2*  HGB 12.5  HCT 37.1  PLT 228    Chemistries  Recent Labs  Lab 08/16/23 1307 08/17/23 0326  NA 140 137  K 3.4* 3.4*  CL 104 101  CO2 24 25  GLUCOSE 129* 170*  BUN 20 16  CREATININE 0.89 0.72  CALCIUM  8.9 8.1*  AST 23  --   ALT 23  --   ALKPHOS 66  --   BILITOT 0.7  --    Cardiac Enzymes No results for input(s): TROPONINI in the last 168 hours. RADIOLOGY:  DG HIP UNILAT WITH PELVIS 2-3 VIEWS RIGHT Result Date: 08/16/2023 CLINICAL DATA:  Elective surgery.  Right hip. EXAM: DG HIP (WITH OR WITHOUT PELVIS) 2-3V RIGHT COMPARISON:  Pelvis and right hip radiographs 08/16/2023 and CT right hip 08/16/2023 FINDINGS: Images were performed intraoperatively without the presence of a radiologist. The patient is undergoing ORIF of the previously seen subcapital proximal right femoral fracture with 3 longitudinal screws. Total fluoroscopy images: 5 Total fluoroscopy time: 34 seconds Total dose: Radiation Exposure Index (as provided by the fluoroscopic device): 5.1 mGy air Kerma Please see intraoperative findings for further detail. IMPRESSION: Intraoperative  fluoroscopy for ORIF of the proximal right femoral fracture. Electronically Signed   By: Bertina Broccoli M.D.   On: 08/16/2023 17:46   DG C-Arm 1-60 Min-No Report Result Date: 08/16/2023 Fluoroscopy was utilized by the requesting physician.  No radiographic interpretation.   CT Hip Right Wo Contrast Result Date: 08/16/2023 CLINICAL DATA:  Right hip pain after fall. EXAM: CT OF THE RIGHT HIP WITHOUT CONTRAST TECHNIQUE: Multidetector CT imaging of the right hip was performed according to the standard protocol. Multiplanar CT image reconstructions were also generated. RADIATION DOSE REDUCTION: This exam was performed according to the departmental dose-optimization program which includes automated exposure control, adjustment of the mA and/or kV according to patient size and/or use of iterative reconstruction technique. COMPARISON:  Same day radiographs of the right hip dated 08/16/2023. FINDINGS: Bones/Joint/Cartilage Subtle region of cortical and trabecular lucency along the posterior femoral head and neck junction (series 7, image 60), may reflect a nondisplaced fracture. The remainder of the visualized bones are intact. The right femoral head is seated within the acetabulum. Moderate osteoarthritis of the right hip manifested by joint space narrowing and marginal osteophytosis. Ligaments Ligaments are suboptimally evaluated by CT. Muscles and Tendons Muscles are within normal limits. No intramuscular fluid collection or hematoma. Soft tissue No fluid collection or hematoma. No soft tissue mass. Small fat containing right inguinal hernia. IMPRESSION: 1. Subtle region of cortical and trabecular lucency along the right posterior femoral head and neck junction may reflect  a nondisplaced fracture. The remainder of the visualized bones are intact 2. Moderate osteoarthritis of the right hip. Electronically Signed   By: Mannie Seek M.D.   On: 08/16/2023 12:33   DG Hip Unilat W or Wo Pelvis 2-3 Views Right Result  Date: 08/16/2023 CLINICAL DATA:  Right hip pain after fall today. EXAM: DG HIP (WITH OR WITHOUT PELVIS) 2-3V RIGHT COMPARISON:  None Available. FINDINGS: There is no evidence of hip fracture or dislocation. There is no evidence of arthropathy or other focal bone abnormality. IMPRESSION: Negative. Electronically Signed   By: Rosalene Colon M.D.   On: 08/16/2023 11:16   CT Head Wo Contrast Result Date: 08/16/2023 CLINICAL DATA:  Trauma, fall, fell off of stool and hit right-side of head. EXAM: CT HEAD WITHOUT CONTRAST CT CERVICAL SPINE WITHOUT CONTRAST TECHNIQUE: Multidetector CT imaging of the head and cervical spine was performed following the standard protocol without intravenous contrast. Multiplanar CT image reconstructions of the cervical spine were also generated. RADIATION DOSE REDUCTION: This exam was performed according to the departmental dose-optimization program which includes automated exposure control, adjustment of the mA and/or kV according to patient size and/or use of iterative reconstruction technique. COMPARISON:  CT head 06/05/2020. FINDINGS: CT HEAD FINDINGS Brain: No acute intracranial hemorrhage. Redemonstrated remote lacunar infarct versus prominent perivascular space in the inferior right basal ganglia. Increased heterogeneity of the basal ganglia particularly on the left likely related to chronic microvascular changes. Nonspecific hypoattenuation in the periventricular and subcortical white matter favored to reflect chronic microvascular ischemic changes. Mild parenchymal volume loss. No edema, mass effect, or midline shift. The basilar cisterns are patent. Ventricles: The ventricles are normal. Vascular: Atherosclerotic calcifications of the carotid siphons and intracranial vertebral arteries. No hyperdense vessel. Skull: No acute or aggressive finding. Orbits: Orbits are symmetric. Sinuses: The visualized paranasal sinuses are clear. Other: Mastoid air cells are clear. CT CERVICAL  SPINE FINDINGS Alignment: Cervical lordosis is maintained. Trace retrolisthesis of C4 on C5. No facet subluxation or dislocation. Skull base and vertebrae: No compression fracture or displaced fracture in the cervical spine. No suspicious osseous lesion. Chronic appearing deformity of the medial left clavicle. Soft tissues and spinal canal: No prevertebral fluid or swelling. No visible canal hematoma. Disc levels: Intervertebral disc space narrowing greatest at C4-5. Disc osteophyte complexes at multiple levels. No high-grade osseous spinal canal stenosis. Facet arthrosis and uncovertebral hypertrophy throughout the cervical spine. Foraminal stenosis most pronounced on the right at C6-7. Upper chest: Biapical pleuroparenchymal scarring and pleural calcifications. Other: None IMPRESSION: No CT evidence of acute intracranial abnormality. No acute fracture or traumatic malalignment of the cervical spine. Chronic appearing deformity of the medial left clavicle. Redemonstrated chronic microvascular ischemic changes. Degenerative changes of the cervical spine as above. Electronically Signed   By: Denny Flack M.D.   On: 08/16/2023 11:07   CT Cervical Spine Wo Contrast Result Date: 08/16/2023 CLINICAL DATA:  Trauma, fall, fell off of stool and hit right-side of head. EXAM: CT HEAD WITHOUT CONTRAST CT CERVICAL SPINE WITHOUT CONTRAST TECHNIQUE: Multidetector CT imaging of the head and cervical spine was performed following the standard protocol without intravenous contrast. Multiplanar CT image reconstructions of the cervical spine were also generated. RADIATION DOSE REDUCTION: This exam was performed according to the departmental dose-optimization program which includes automated exposure control, adjustment of the mA and/or kV according to patient size and/or use of iterative reconstruction technique. COMPARISON:  CT head 06/05/2020. FINDINGS: CT HEAD FINDINGS Brain: No acute intracranial hemorrhage. Redemonstrated  remote lacunar infarct versus prominent perivascular space in the inferior right basal ganglia. Increased heterogeneity of the basal ganglia particularly on the left likely related to chronic microvascular changes. Nonspecific hypoattenuation in the periventricular and subcortical white matter favored to reflect chronic microvascular ischemic changes. Mild parenchymal volume loss. No edema, mass effect, or midline shift. The basilar cisterns are patent. Ventricles: The ventricles are normal. Vascular: Atherosclerotic calcifications of the carotid siphons and intracranial vertebral arteries. No hyperdense vessel. Skull: No acute or aggressive finding. Orbits: Orbits are symmetric. Sinuses: The visualized paranasal sinuses are clear. Other: Mastoid air cells are clear. CT CERVICAL SPINE FINDINGS Alignment: Cervical lordosis is maintained. Trace retrolisthesis of C4 on C5. No facet subluxation or dislocation. Skull base and vertebrae: No compression fracture or displaced fracture in the cervical spine. No suspicious osseous lesion. Chronic appearing deformity of the medial left clavicle. Soft tissues and spinal canal: No prevertebral fluid or swelling. No visible canal hematoma. Disc levels: Intervertebral disc space narrowing greatest at C4-5. Disc osteophyte complexes at multiple levels. No high-grade osseous spinal canal stenosis. Facet arthrosis and uncovertebral hypertrophy throughout the cervical spine. Foraminal stenosis most pronounced on the right at C6-7. Upper chest: Biapical pleuroparenchymal scarring and pleural calcifications. Other: None IMPRESSION: No CT evidence of acute intracranial abnormality. No acute fracture or traumatic malalignment of the cervical spine. Chronic appearing deformity of the medial left clavicle. Redemonstrated chronic microvascular ischemic changes. Degenerative changes of the cervical spine as above. Electronically Signed   By: Denny Flack M.D.   On: 08/16/2023 11:07     Assessment and Plan  [name] is a 89 y.o. female with medical history significant of chronic atrial fibrillation on anticoagulation with Pradaxa , history of asthma/COPD, Tonna Frederic, hypertension comes to the emergency room after she had an accidental fall at home while she was trying to grab a lipstick sitting on the stool in the bathroom and slid off the stool is a tilted. Patient did hit her head however did not lose consciousness.   Mechanical fall with acute right hip pain suspected femoral/hip fracture -- orthopedic consultation with Dr.Poggi -- PO and PRN IV pain meds ---6/12--POD#1 did well with surgery. PT/OT to see today.    History of chronic atrial fibrillation -- continue amiodarone  and metoprolol  -- resumed Pradaxa  --HR stable   Hypertension -- continue metoprolol  and hydrochlorothiazide    Asthma/COPD -- sats stable -- inhalers/nebulizer as needed   Elevated white count -- appears reactive. So far no source of infection identified.  --CBC trending down      Advance Care Planning:   Code Status: Full Code   Consults: ortho  Family Communication: husband  Procedures:  In situ cannulated screw fixation of valgus-impacted right femoral neck fracture  DVT Prophylaxis :Pradaxa  Level of care: Med-Surg Status is: Inpatient Remains inpatient appropriate because: POD #1, PT/OT to see pt and TOC for d/c planning    TOTAL TIME TAKING CARE OF THIS PATIENT: 40 minutes.  >50% time spent on counselling and coordination of care  Note: This dictation was prepared with Dragon dictation along with smaller phrase technology. Any transcriptional errors that result from this process are unintentional.  Melvinia Stager M.D    [facility]   CC: Primary care physician; Sari Cunning, MD

## 2023-08-17 NOTE — Plan of Care (Signed)

## 2023-08-17 NOTE — TOC Initial Note (Signed)
 Transition of Care [facility]) - Initial/Assessment Note    Patient Details  Name: [name] MRN: [id number] Date of Birth: 04-03-1933  Transition [facility]) CM/SW Contact:    Alexandra Ice, RN Phone Number: 08/17/2023, 1:23 PM  Clinical Narrative:                  Patient needing short term rehab for discharge. PASRR, FL2 and bed search completed. Pending bed offers.  Expected Discharge Plan: Skilled Nursing Facility Barriers to Discharge: Continued Medical Work up   Patient Goals and CMS Choice            Expected Discharge Plan and Services     Post Acute Care Choice: Skilled Nursing Facility Living arrangements for the past 2 months: Single Family Home                   DME Agency: NA       HH Arranged: NA          Prior Living Arrangements/Services Living arrangements for the past 2 months: Single Family Home Lives with:: Spouse Patient language and need for interpreter reviewed:: Yes Do you feel safe going back to the place where you live?: Yes      Need for Family Participation in Patient Care: No (Comment) Care giver support system in place?: Yes (comment) Current home services: DME (grab-bars, single point cane, rolling walker, transport chair, shower seat) Criminal Activity/Legal Involvement Pertinent to Current Situation/Hospitalization: No - Comment as needed  Activities of Daily Living   ADL Screening (condition at time of admission) Independently performs ADLs?: No Does the patient have a NEW difficulty with bathing/dressing/toileting/self-feeding that is expected to last >3 days?: Yes (Initiates electronic notice to provider for possible OT consult) Does the patient have a NEW difficulty with getting in/out of bed, walking, or climbing stairs that is expected to last >3 days?: Yes (Initiates electronic notice to provider for possible PT consult) Does the patient have a NEW difficulty with communication that is expected to last >3  days?: No Is the patient deaf or have difficulty hearing?: No Does the patient have difficulty seeing, even when wearing glasses/contacts?: No Does the patient have difficulty concentrating, remembering, or making decisions?: No  Permission Sought/Granted                  Emotional Assessment Appearance:: Appears stated age Attitude/Demeanor/Rapport: Engaged Affect (typically observed): Appropriate Orientation: : Oriented to Self, Oriented to Place, Oriented to  Time, Oriented to Situation Alcohol / Substance Use: Not Applicable Psych Involvement: No (comment)  Admission diagnosis:  Acute hip pain, right [M25.551] Hip pain, acute, right [M25.551] Patient Active Problem List   Diagnosis Date Noted   Closed fracture of right femur, unspecified fracture morphology, sequela 08/17/2023   Hip pain, acute, right 08/16/2023   Acute on chronic heart failure with preserved ejection fraction (HFpEF) (HCC) 07/19/2023   Hypokalemia 07/19/2023   Atrial fibrillation (HCC) 07/18/2023   Palpitations 05/18/2023   Leukocytosis 05/18/2023   Demand ischemia (HCC) 05/18/2023   (HFpEF) heart failure with preserved ejection fraction (HCC) 05/18/2023   Psoriatic arthritis (HCC) 05/18/2023   Chronic respiratory failure with hypoxia (HCC) 06/29/2022   Chronic nausea 12/09/2020   Closed intracapsular fracture of left femur with routine healing 08/28/2020   Acquired thrombophilia (HCC) 07/15/2020   Paroxysmal atrial fibrillation with RVR (HCC) 06/19/2020   Atrial fibrillation with rapid ventricular response (HCC) 06/09/2020   Fracture of femoral neck, left, closed (HCC)  06/05/2020   Atherosclerosis of aorta (HCC) 01/09/2020   Chronic obstructive pulmonary disease (HCC) 01/09/2020   HSV (herpes simplex virus) infection 01/09/2020   BCC (basal cell carcinoma), face 10/12/2017   Environmental and seasonal allergies 10/30/2014   Psoriasis vulgaris 10/30/2014   Primary hypertension 10/30/2014    Gastro-esophageal reflux disease without esophagitis 10/30/2014   Hot flash, menopausal 10/30/2014   Hyperlipidemia, mixed 10/30/2014   History of cancer of left breast 10/30/2014   Bronchiectasis (HCC) 09/24/2013   PCP:  Sari Cunning, MD Pharmacy:   [facility] [id number] - [place], [place] - [address] AT [facility] [address] [place] [address] Phone: [phone] Fax: [phone]  [facility] [address] [place] [address] Phone: [phone] Fax: [phone]     Social Drivers of Health (SDOH) Social History: SDOH Screenings   Food Insecurity: Patient Declined (08/16/2023)  Housing: Patient Declined (08/16/2023)  Transportation Needs: Patient Declined (08/16/2023)  Utilities: Patient Declined (08/16/2023)  Alcohol Screen: Low Risk  (02/17/2021)  Depression (PHQ2-9): Medium Risk (11/05/2021)  Financial Resource Strain: Low Risk  (08/10/2023)   Received from [facility]  Physical Activity: Sufficiently Active (02/17/2021)  Social Connections: Patient Declined (08/16/2023)  Recent Concern: Social Connections - Moderately Isolated (07/18/2023)  Stress: No Stress Concern Present (02/17/2021)  Tobacco Use: Low Risk  (08/16/2023)  Recent Concern: Tobacco Use - Medium Risk (08/10/2023)   Received from [facility]   SDOH Interventions:     Readmission Risk Interventions     No data to display

## 2023-08-17 NOTE — Evaluation (Signed)
 Occupational Therapy Evaluation Patient Details Name: [name] MRN: [id number] DOB: Dec 02, 1933 Today's Date: 08/17/2023   History of Present Illness   [name] is a 89 y.o. female  with history of HTN, atrial fibrillation on Pradaxa , COPD and presented to the emergency department for treatment and evaluation of right hip pain after mechanical, nonsyncopal fall.Imaging showed valgus impacted R femoral neck fracture. Patient is s/p in situ cannulated screw fixation of fracture. Partial WB on RLE.     Clinical Impressions Pt admitted with above diagnosis. PTA, pt required RW for mobility and assist from spouse for bathing/LB ADLs. On OT eval, pt required MOD A for bed mobility and increased time, MOD A +2 (line + lead mgmt) for STS + functional transfers using RW, cues to maintain PWB on RLE, and MAX A for pericare/LB dressing after incontinent urine void in standing.  Pt would benefit from skilled OT services to address noted impairments and functional limitations (see below for any additional details) in order to maximize safety and independence while minimizing falls risk and caregiver burden. Anticipate the need for follow up OT services upon acute hospital DC. Patient will benefit from continued inpatient follow up therapy, <3 hours/day.      If plan is discharge home, recommend the following:   A lot of help with walking and/or transfers;A lot of help with bathing/dressing/bathroom;Assistance with cooking/housework;Help with stairs or ramp for entrance;Assist for transportation     Functional Status Assessment   Patient has had a recent decline in their functional status and demonstrates the ability to make significant improvements in function in a reasonable and predictable amount of time.     Equipment Recommendations   Other (comment)      Precautions/Restrictions   Precautions Precautions: Fall Recall of Precautions/Restrictions: Impaired Restrictions Weight  Bearing Restrictions Per Provider Order: Yes RLE Weight Bearing Per Provider Order: Partial weight bearing RLE Partial Weight Bearing Percentage or Pounds: 50%     Mobility Bed Mobility Overal bed mobility: Needs Assistance Bed Mobility: Supine to Sit     Supine to sit: Mod assist          Transfers Overall transfer level: Needs assistance Equipment used: Rolling walker (2 wheels) Transfers: Sit to/from Stand, Bed to chair/wheelchair/BSC Sit to Stand: Mod assist, +2 safety/equipment     Step pivot transfers: Mod assist     General transfer comment: pt educated on PWB RLE precautions, required heavy UE reliance on RW to maintain. +2 for line / lead mgmt, MOD A to rise from bed and pt able to perform slow steps to sit in recliner. several standing bouts completed from lower recliner surface, cues for hand placement and to control descent      Balance Overall balance assessment: Needs assistance Sitting-balance support: Single extremity supported, Feet supported Sitting balance-Leahy Scale: Fair     Standing balance support: Bilateral upper extremity supported, Reliant on assistive device for balance, During functional activity Standing balance-Leahy Scale: Poor Standing balance comment: requires UE support on RW                           ADL either performed or assessed with clinical judgement   ADL Overall ADL's : Needs assistance/impaired Eating/Feeding: Sitting;Independent   Grooming: Sitting;Set up       Lower Body Bathing: Maximal assistance;Sit to/from stand;Sitting/lateral leans;Minimal assistance Lower Body Bathing Details (indicate cue type and reason): pt assists with LB bathing after incontinent episode, reaches  thighs, OT provides assist for feet and lower legs Upper Body Dressing : Sitting;Minimal assistance Upper Body Dressing Details (indicate cue type and reason): doffs and dons gown seated Lower Body Dressing: Maximal assistance;Sit  to/from stand Lower Body Dressing Details (indicate cue type and reason): doffs / dons socks, MAX A to don underwear due to void urgency and UE support required on RW in standing to maintain PWB RLE Toilet Transfer: +2 for safety/equipment;Moderate assistance;BSC/3in1;Rolling walker (2 wheels) Toilet Transfer Details (indicate cue type and reason): simulated to recliner Toileting- Clothing Manipulation and Hygiene: Sit to/from stand;Moderate assistance Toileting - Clothing Manipulation Details (indicate cue type and reason): pt incontinent of urine in standing / pure wick failure.     Functional mobility during ADLs: Moderate assistance;Rolling walker (2 wheels);Cueing for sequencing;+2 for safety/equipment General ADL Comments: presents with generalized weakness and decreased activity tolerance from baseline     Vision Baseline Vision/History: 1 Wears glasses Ability to See in Adequate Light: 0 Adequate Patient Visual Report: No change from baseline              Pertinent Vitals/Pain Pain Assessment Pain Assessment: No/denies pain     Extremity/Trunk Assessment Upper Extremity Assessment Upper Extremity Assessment: Generalized weakness;Right hand dominant   Lower Extremity Assessment Lower Extremity Assessment: Defer to PT evaluation   Cervical / Trunk Assessment Cervical / Trunk Assessment: Normal   Communication Communication Communication: No apparent difficulties   Cognition Arousal: Alert Behavior During Therapy: WFL for tasks assessed/performed                                 Following commands: Intact       Cueing  General Comments   Cueing Techniques: Verbal cues;Tactile cues  Pt on 1L O2 upon arrival with SpO2 97%, attempted to trial RA with SpO2 at 88%. Placed on 2L with SpO2 94% end of session. RN updated on pt's position, need for new pure wick           Home Living Family/patient expects to be discharged to:: Private  residence Living Arrangements: Spouse/significant other Available Help at Discharge: Family;Available 24 hours/day Type of Home: House Home Access: Stairs to enter [employer] of Steps: 5 Entrance Stairs-Rails: Right;Left;Can reach both Home Layout: Able to live on main level with bedroom/bathroom;Two level     Bathroom Shower/Tub: Producer, television/film/video: Handicapped height     Home Equipment: Grab bars - toilet;Grab bars - tub/shower;Cane - single Librarian, academic (2 wheels);Transport chair;Shower seat - built in          Prior Functioning/Environment Prior Level of Function : Independent/Modified Independent             Mobility Comments: Patient reports has been using a RW since had pneumonia approx 2 months ago. ADLs Comments: Reports husband assist with ADLs/bathings; assist into/out of shower.    OT Problem List: Decreased strength;Decreased range of motion;Decreased activity tolerance;Impaired balance (sitting and/or standing);Decreased knowledge of precautions;Decreased knowledge of use of DME or AE   OT Treatment/Interventions: Self-care/ADL training;Therapeutic exercise;Patient/family education;Balance training;DME and/or AE instruction      OT Goals(Current goals can be found in the care plan section)   Acute Rehab OT Goals OT Goal Formulation: With patient Time For Goal Achievement: 08/31/23 Potential to Achieve Goals: Good   OT Frequency:  Min 2X/week    Co-evaluation PT/OT/SLP Co-Evaluation/Treatment: Yes Reason for Co-Treatment: For patient/therapist safety;To address functional/ADL transfers  PT goals addressed during session: Mobility/safety with mobility;Balance OT goals addressed during session: ADL's and self-care      AM-PAC OT 6 Clicks Daily Activity     Outcome Measure Help from another person eating meals?: None Help from another person taking care of personal grooming?: A Little Help from another person  toileting, which includes using toliet, bedpan, or urinal?: A Lot Help from another person bathing (including washing, rinsing, drying)?: A Lot Help from another person to put on and taking off regular upper body clothing?: A Little Help from another person to put on and taking off regular lower body clothing?: A Lot 6 Click Score: 16   End of Session Equipment Utilized During Treatment: Gait belt;Rolling walker (2 wheels);Oxygen  Nurse Communication: Mobility status;Other (comment) (O2 levels and pure wick)  Activity Tolerance: Patient tolerated treatment well Patient left: in chair;with call bell/phone within reach;with chair alarm set;with family/visitor present  OT Visit Diagnosis: Unsteadiness on feet (R26.81);Other abnormalities of gait and mobility (R26.89);Muscle weakness (generalized) (M62.81);History of falling (Z91.81)                Time: 1610-9604 OT Time Calculation (min): 30 min Charges:  OT General Charges $OT Visit: 1 Visit OT Evaluation $OT Eval Low Complexity: 1 Low  Ryden Wainer L. Krystiana Fornes, OTR/L  08/17/23, 11:10 AM

## 2023-08-17 NOTE — NC FL2 (Signed)
 [place]  MEDICAID FL2 LEVEL OF CARE FORM     IDENTIFICATION  Patient Name: [name] Birthdate: 1933/08/12 Sex: female Admission Date (Current Location): 08/16/2023  [facility] and [place] Number:  Chiropodist and Address:  [facility], [address], [place], [place] [address]      Provider Number: [id number]  Attending Physician Name and Address:  Melvinia Stager, MD  Relative Name and Phone Number:  Spouse: Laree Garron, [phone]    Current Level of Care: Hospital Recommended Level of Care: Skilled Nursing Facility Prior Approval Number:    Date Approved/Denied:   PASRR Number: [id number]  Discharge Plan: SNF    Current Diagnoses: Patient Active Problem List   Diagnosis Date Noted   Closed fracture of right femur, unspecified fracture morphology, sequela 08/17/2023   Hip pain, acute, right 08/16/2023   Acute on chronic heart failure with preserved ejection fraction (HFpEF) (HCC) 07/19/2023   Hypokalemia 07/19/2023   Atrial fibrillation (HCC) 07/18/2023   Palpitations 05/18/2023   Leukocytosis 05/18/2023   Demand ischemia (HCC) 05/18/2023   (HFpEF) heart failure with preserved ejection fraction (HCC) 05/18/2023   Psoriatic arthritis (HCC) 05/18/2023   Chronic respiratory failure with hypoxia (HCC) 06/29/2022   Chronic nausea 12/09/2020   Closed intracapsular fracture of left femur with routine healing 08/28/2020   Acquired thrombophilia (HCC) 07/15/2020   Paroxysmal atrial fibrillation with RVR (HCC) 06/19/2020   Atrial fibrillation with rapid ventricular response (HCC) 06/09/2020   Fracture of femoral neck, left, closed (HCC) 06/05/2020   Atherosclerosis of aorta (HCC) 01/09/2020   Chronic obstructive pulmonary disease (HCC) 01/09/2020   HSV (herpes simplex virus) infection 01/09/2020   BCC (basal cell carcinoma), face 10/12/2017   Environmental and seasonal allergies 10/30/2014   Psoriasis vulgaris 10/30/2014   Primary  hypertension 10/30/2014   Gastro-esophageal reflux disease without esophagitis 10/30/2014   Hot flash, menopausal 10/30/2014   Hyperlipidemia, mixed 10/30/2014   History of cancer of left breast 10/30/2014   Bronchiectasis (HCC) 09/24/2013    Orientation RESPIRATION BLADDER Height & Weight     Self, Time, Situation, Place  Normal Incontinent Weight: 52.2 kg Height:  5' 2 (157.5 cm)  BEHAVIORAL SYMPTOMS/MOOD NEUROLOGICAL BOWEL NUTRITION STATUS      Continent Diet (Heart healthy, thin liquids)  AMBULATORY STATUS COMMUNICATION OF NEEDS Skin   Extensive Assist   Surgical wounds                       Personal Care Assistance Level of Assistance  Bathing, Feeding, Dressing Bathing Assistance: Limited assistance Feeding assistance: Limited assistance Dressing Assistance: Limited assistance     Functional Limitations Info             SPECIAL CARE FACTORS FREQUENCY  PT (By licensed PT), OT (By licensed OT)     PT Frequency: 5 times per week OT Frequency: 5 times per week            Contractures Contractures Info: Not present    Additional Factors Info  Code Status, Allergies Code Status Info: Full code Allergies Info: Amoxicillin , Eliquis            Current Medications (08/17/2023):  This is the current hospital active medication list Current Facility-Administered Medications  Medication Dose Route Frequency Provider Last Rate Last Admin   0.9 %  sodium chloride  infusion   Intravenous Continuous Poggi, John J, MD 75 mL/hr at 08/16/23 1835 New Bag at 08/16/23 1835   acetaminophen  (TYLENOL ) tablet 650  mg  650 mg Oral Q6H PRN Patel, Sona, MD       Or   acetaminophen  (TYLENOL ) suppository 650 mg  650 mg Rectal Q6H PRN Patel, Sona, MD       acetaminophen  (TYLENOL ) tablet 500 mg  500 mg Oral Q6H Poggi, Kaylene Pascal, MD   500 mg at 08/17/23 0631   amiodarone  (PACERONE ) tablet 200 mg  200 mg Oral Daily Poggi, John J, MD   200 mg at 08/17/23 0454   bisacodyl  (DULCOLAX)  suppository 10 mg  10 mg Rectal Daily PRN Patel, Sona, MD       budesonide -glycopyrrolate -formoterol  (BREZTRI ) 160-9-4.8 MCG/ACT inhaler 2 puff  2 puff Inhalation BID Poggi, Kaylene Pascal, MD   2 puff at 08/17/23 0908   dabigatran  (PRADAXA ) capsule 150 mg  150 mg Oral Q12H Poggi, John J, MD   150 mg at 08/17/23 0907   diphenhydrAMINE (BENADRYL) 12.5 MG/5ML elixir 12.5-25 mg  12.5-25 mg Oral Q4H PRN Poggi, John J, MD       docusate sodium  (COLACE) capsule 100 mg  100 mg Oral BID Poggi, John J, MD   100 mg at 08/17/23 0981   folic acid  (FOLVITE ) tablet 1 mg  1 mg Oral Daily Poggi, John J, MD   1 mg at 08/17/23 1914   furosemide  (LASIX ) tablet 40 mg  40 mg Oral Daily Poggi, Kaylene Pascal, MD   40 mg at 08/17/23 7829   hydrochlorothiazide  (HYDRODIURIL ) tablet 25 mg  25 mg Oral Daily Poggi, John J, MD   25 mg at 08/17/23 5621   HYDROcodone -acetaminophen  (NORCO/VICODIN) 5-325 MG per tablet 1-2 tablet  1-2 tablet Oral Q4H PRN Patel, Sona, MD       LORazepam  (ATIVAN ) tablet 0.5 mg  0.5 mg Oral q AM Poggi, John J, MD   0.5 mg at 08/17/23 0631   [START ON 08/20/2023] methotrexate  (RHEUMATREX) tablet 5 mg  5 mg Oral Weekly Poggi, Kaylene Pascal, MD       metoCLOPramide  (REGLAN ) tablet 5-10 mg  5-10 mg Oral Q8H PRN Poggi, John J, MD       Or   metoCLOPramide  (REGLAN ) injection 5-10 mg  5-10 mg Intravenous Q8H PRN Poggi, John J, MD       metoprolol  tartrate (LOPRESSOR ) tablet 37.5 mg  37.5 mg Oral BID Poggi, John J, MD   37.5 mg at 08/17/23 3086   morphine  (PF) 2 MG/ML injection 2 mg  2 mg Intravenous Q2H PRN Patel, Sona, MD       ondansetron  (ZOFRAN ) tablet 4 mg  4 mg Oral Q6H PRN Patel, Sona, MD       Or   ondansetron  (ZOFRAN ) injection 4 mg  4 mg Intravenous Q6H PRN Patel, Sona, MD   4 mg at 08/16/23 1849   pantoprazole  (PROTONIX ) EC tablet 40 mg  40 mg Oral Daily Poggi, John J, MD   40 mg at 08/17/23 0907   polyethylene glycol (MIRALAX  / GLYCOLAX ) packet 17 g  17 g Oral Daily PRN Patel, Sona, MD       senna (SENOKOT) tablet  8.6 mg  1 tablet Oral BID Patel, Sona, MD   8.6 mg at 08/17/23 0907   sodium phosphate  (FLEET) enema 1 enema  1 enema Rectal Once PRN Poggi, Kaylene Pascal, MD       traZODone  (DESYREL ) tablet 25 mg  25 mg Oral QHS PRN Patel, Sona, MD         Discharge Medications: Please see discharge summary for a  list of discharge medications.  Relevant Imaging Results:  Relevant Lab Results:   Additional Information SS# [id number]  Alexandra Ice, RN

## 2023-08-17 NOTE — Anesthesia Postprocedure Evaluation (Signed)
 Anesthesia Post Note  Patient: [name]  Procedure(s) Performed: FIXATION, FEMUR, NECK, PERCUTANEOUS, USING SCREW (Right: Hip)  Patient location during evaluation: PACU Anesthesia Type: General Level of consciousness: awake and alert Pain management: pain level controlled Vital Signs Assessment: post-procedure vital signs reviewed and stable Respiratory status: spontaneous breathing, nonlabored ventilation, respiratory function stable and patient connected to nasal cannula oxygen  Cardiovascular status: blood pressure returned to baseline and stable Postop Assessment: no apparent nausea or vomiting Anesthetic complications: no   No notable events documented.   Last Vitals:  Vitals:   08/16/23 2332 08/17/23 0243  BP: 130/64 (!) 109/54  Pulse: 68 62  Resp: 16 16  Temp: (!) 36.4 C 36.8 C  SpO2: 97% 95%    Last Pain:  Vitals:   08/17/23 0031  TempSrc:   PainSc: 0-No pain                 Zula Hitch

## 2023-08-18 DIAGNOSIS — S7291XS Unspecified fracture of right femur, sequela: Secondary | ICD-10-CM | POA: Diagnosis not present

## 2023-08-18 DIAGNOSIS — M25551 Pain in right hip: Secondary | ICD-10-CM | POA: Diagnosis not present

## 2023-08-18 DIAGNOSIS — I10 Essential (primary) hypertension: Secondary | ICD-10-CM | POA: Diagnosis not present

## 2023-08-18 DIAGNOSIS — J449 Chronic obstructive pulmonary disease, unspecified: Secondary | ICD-10-CM | POA: Diagnosis not present

## 2023-08-18 LAB — CBC
HCT: 35.7 % — ABNORMAL LOW (ref 36.0–46.0)
Hemoglobin: 12 g/dL (ref 12.0–15.0)
MCH: 29.6 pg (ref 26.0–34.0)
MCHC: 33.6 g/dL (ref 30.0–36.0)
MCV: 88.1 fL (ref 80.0–100.0)
Platelets: 219 10*3/uL (ref 150–400)
RBC: 4.05 MIL/uL (ref 3.87–5.11)
RDW: 14.9 % (ref 11.5–15.5)
WBC: 19 10*3/uL — ABNORMAL HIGH (ref 4.0–10.5)
nRBC: 0 % (ref 0.0–0.2)

## 2023-08-18 NOTE — Progress Notes (Signed)
 Subjective: 2 Days Post-Op Procedure(s) (LRB): FIXATION, FEMUR, NECK, PERCUTANEOUS, USING SCREW (Right) Patient reports pain as mild.   Patient is well, and has had no acute complaints or problems PT and care management to assist with discharge planning.  Possible d/c to SNF. Negative for chest pain and shortness of breath Fever: no Gastrointestinal:Negative for nausea and vomiting Reports she is passing some gas.  No BM yet.  Objective: Vital signs in last 24 hours: Temp:  [97.9 F (36.6 C)-98.2 F (36.8 C)] 98.1 F (36.7 C) (06/13 0351) Pulse Rate:  [67-89] 69 (06/13 0351) Resp:  [16-19] 19 (06/13 0351) BP: (110-152)/(47-68) 126/51 (06/13 0351) SpO2:  [94 %-98 %] 96 % (06/13 0351)  Intake/Output from previous day:  Intake/Output Summary (Last 24 hours) at 08/18/2023 0717 Last data filed at 08/17/2023 1500 Gross per 24 hour  Intake 1640 ml  Output 900 ml  Net 740 ml    Intake/Output this shift: No intake/output data recorded.  Labs: Recent Labs    08/16/23 1307 08/17/23 0326 08/18/23 0315  HGB 15.0 12.5 12.0   Recent Labs    08/17/23 0326 08/18/23 0315  WBC 18.2* 19.0*  RBC 4.23 4.05  HCT 37.1 35.7*  PLT 228 219   Recent Labs    08/16/23 1307 08/17/23 0326  NA 140 137  K 3.4* 3.4*  CL 104 101  CO2 24 25  BUN 20 16  CREATININE 0.89 0.72  GLUCOSE 129* 170*  CALCIUM  8.9 8.1*   No results for input(s): LABPT, INR in the last 72 hours.   EXAM General - Patient is Alert, Appropriate, and Oriented Extremity - ABD soft Neurovascular intact Dorsiflexion/Plantar flexion intact Incision: moderate drainage No cellulitis present Compartment soft Dressing/Incision - No drainage noted to the right hip honeycomb dressing. Motor Function - intact, moving foot and toes well on exam.  Abdomen soft with intact bowel sounds.  Past Medical History:  Diagnosis Date   Acquired thrombophilia (HCC)    Acute metabolic encephalopathy 06/19/2020   Allergy     Atherosclerosis of aorta (HCC)    Atrial fibrillation (HCC)    Bronchiectasis (HCC)    Cancer (HCC)    left breast cancer    Chronic nausea    COPD (chronic obstructive pulmonary disease) (HCC)    GERD (gastroesophageal reflux disease)    Grade II diastolic dysfunction    Hyperlipemia    Hypertension    Intractable nausea and vomiting 06/20/2018   Mild concentric left ventricular hypertrophy (LVH)    Mild mitral stenosis by prior echocardiogram    Mild pulmonary hypertension (HCC)    Psoriatic arthritis (HCC)    Stage 2 moderate COPD by GOLD classification (HCC)     Assessment/Plan: 2 Days Post-Op Procedure(s) (LRB): FIXATION, FEMUR, NECK, PERCUTANEOUS, USING SCREW (Right) Principal Problem:   Hip pain, acute, right Active Problems:   Primary hypertension   Chronic obstructive pulmonary disease (HCC)   Closed fracture of right femur, unspecified fracture morphology, sequela  Estimated body mass index is 21.03 kg/m as calculated from the following:   Height as of this encounter: 5' 2 (1.575 m).   Weight as of this encounter: 52.2 kg. Advance diet Up with therapy D/C IV fluids when tolerating po intake.  Labs and vitals reviewed.  WBC 19.0.  No signs of infection to the right hip. Up with PT.   Continue to work on a BM.  Following discharge, follow-up with [facility] in 10-14 days for staple removal and x-rays of the  right hip.  DVT Prophylaxis - TED hose and Pradaxa  50% weightbearing to the right leg.  Antoine Bathe, PA-C [facility] 08/18/2023, 7:17 AM

## 2023-08-18 NOTE — TOC Progression Note (Signed)
 Transition of Care [facility]) - Progression Note    Patient Details  Name: [name] MRN: [id number] Date of Birth: 1933-10-09  Transition of Care [facility] CM/SW Contact  Elsie Halo, RN Phone Number: 08/18/2023, 11:57 AM  Clinical Narrative:    TOC shared the bed offers with the patient and her family in the room. The patient chooses [facility]. Don Fritter is requesting insurance authorization.     Expected Discharge Plan: Skilled Nursing Facility Barriers to Discharge: Continued Medical Work up  Expected Discharge Plan and Services     Post Acute Care Choice: Skilled Nursing Facility Living arrangements for the past 2 months: Single Family Home                   DME Agency: NA       HH Arranged: NA           Social Determinants of Health (SDOH) Interventions SDOH Screenings   Food Insecurity: Patient Declined (08/16/2023)  Housing: Patient Declined (08/16/2023)  Transportation Needs: Patient Declined (08/16/2023)  Utilities: Patient Declined (08/16/2023)  Alcohol Screen: Low Risk  (02/17/2021)  Depression (PHQ2-9): Medium Risk (11/05/2021)  Financial Resource Strain: Low Risk  (08/10/2023)   Received from [facility]  Physical Activity: Sufficiently Active (02/17/2021)  Social Connections: Patient Declined (08/16/2023)  Recent Concern: Social Connections - Moderately Isolated (07/18/2023)  Stress: No Stress Concern Present (02/17/2021)  Tobacco Use: Low Risk  (08/16/2023)  Recent Concern: Tobacco Use - Medium Risk (08/10/2023)   Received from [facility]    Readmission Risk Interventions     No data to display

## 2023-08-18 NOTE — Care Management Important Message (Signed)
 Important Message  Patient Details  Name: [name] MRN: [id number] Date of Birth: 1933-07-16   Important Message Given:  Yes - Medicare IM     Lori-Ann Lindfors W, CMA 08/18/2023, 1:36 PM

## 2023-08-18 NOTE — Progress Notes (Signed)
 Physical Therapy Treatment Patient Details Name: [name] MRN: [id number] DOB: 1933-09-21 Today's Date: 08/18/2023   History of Present Illness [name] is a 89 y.o. female  with history of HTN, atrial fibrillation on Pradaxa , COPD and presented to the emergency department for treatment and evaluation of right hip pain after mechanical, nonsyncopal fall.Imaging showed valgus impacted R femoral neck fracture. Patient is s/p in situ cannulated screw fixation of fracture. Partial WB on RLE.    PT Comments  Pt was sitting in recliner upon arrival. She is alert but somewhat lethargic due to pain meds.  Does endorse nausea but was willing to participate. Pt stood several times during session and was able to tolerate ambulation ~ 15 ft with RW prior to having vomiting episode. RN gave meds after for nausea. Overall, pt is progressing well but will benefit from skilled PT at DC to maximize independence and safety with all ADLs. Acute PT will continue to follow per current POC.    If plan is discharge home, recommend the following: A lot of help with walking and/or transfers;Assist for transportation;Help with stairs or ramp for entrance     Equipment Recommendations  Other (comment) (defer to next level of care)       Precautions / Restrictions Precautions Precautions: Fall Recall of Precautions/Restrictions: Impaired Restrictions Weight Bearing Restrictions Per Provider Order: Yes RLE Weight Bearing Per Provider Order: Partial weight bearing RLE Partial Weight Bearing Percentage or Pounds: 50%     Mobility  Bed Mobility  General bed mobility comments: Pt was in recliner pre/post session    Transfers Overall transfer level: Needs assistance Equipment used: Rolling walker (2 wheels) Transfers: Sit to/from Stand Sit to Stand: Min assist, Mod assist  General transfer comment: Pt was able to stand form recliner 3 x throughout session. Vcs for fwd wt shift, handplacement, and overall  sequencing improvements    Ambulation/Gait Ambulation/Gait assistance: Contact guard assist, Min assist Gait Distance (Feet): 15 Feet Assistive device: Rolling walker (2 wheels) Gait Pattern/deviations: Step-to pattern Gait velocity: decreased  General Gait Details: Pt was able to ambulate ~ 15 ft (recliner to doorway of room prior to haveing severe nasuea + vomiting in emesis bag    Balance Overall balance assessment: Needs assistance Sitting-balance support: Bilateral upper extremity supported, Feet supported Sitting balance-Leahy Scale: Fair  Standing balance support: Bilateral upper extremity supported, During functional activity, Reliant on assistive device for balance Standing balance-Leahy Scale: Fair Standing balance comment: reliant on AD for standing       Communication Communication Communication: No apparent difficulties  Cognition Arousal: Alert Behavior During Therapy: WFL for tasks assessed/performed   PT - Cognitive impairments: No apparent impairments    PT - Cognition Comments: Pt is A and O x 4 Following commands: Intact      Cueing Cueing Techniques: Verbal cues, Tactile cues         Pertinent Vitals/Pain Pain Assessment Pain Assessment: 0-10 Pain Score: 3  Pain Descriptors / Indicators: Discomfort, Sore Pain Intervention(s): Limited activity within patient's tolerance, Monitored during session, Premedicated before session, Repositioned     PT Goals (current goals can now be found in the care plan section) Acute Rehab PT Goals Patient Stated Goal: less pain and nasuea Progress towards PT goals: Progressing toward goals    Frequency    7X/week           Co-evaluation     PT goals addressed during session: Mobility/safety with mobility;Balance;Proper use of DME;Strengthening/ROM  AM-PAC PT 6 Clicks Mobility   Outcome Measure  Help needed turning from your back to your side while in a flat bed without using bedrails?: A  Lot Help needed moving from lying on your back to sitting on the side of a flat bed without using bedrails?: A Lot Help needed moving to and from a bed to a chair (including a wheelchair)?: A Lot Help needed standing up from a chair using your arms (e.g., wheelchair or bedside chair)?: A Lot Help needed to walk in hospital room?: A Lot Help needed climbing 3-5 steps with a railing? : A Lot 6 Click Score: 12    End of Session   Activity Tolerance: Patient tolerated treatment well;Other (comment);Treatment limited secondary to medical complications (Comment) (limited by nausea/vomiting) Patient left: in chair;with call bell/phone within reach;with chair alarm set;with family/visitor present Nurse Communication: Mobility status PT Visit Diagnosis: Unsteadiness on feet (R26.81);Other abnormalities of gait and mobility (R26.89);History of falling (Z91.81);Muscle weakness (generalized) (M62.81);Difficulty in walking, not elsewhere classified (R26.2)     Time: 1610-9604 PT Time Calculation (min) (ACUTE ONLY): 31 min  Charges:    $Gait Training: 8-22 mins $Therapeutic Activity: 8-22 mins PT General Charges $$ ACUTE PT VISIT: 1 Visit                    Chester Costa PTA 08/18/23, 3:35 PM

## 2023-08-18 NOTE — Progress Notes (Signed)
 [facility]  - [place] [facility]   PATIENT NAME: [name]    MR#:  [id number]  DATE OF BIRTH:  11-07-1933  SUBJECTIVE:  No issues. Tolerating PT well. Minimal pain   VITALS:  Blood pressure (!) 131/59, pulse 76, temperature 98.1 F (36.7 C), resp. rate 16, height 5' 2 (1.575 m), weight 52.2 kg, SpO2 95%.  PHYSICAL EXAMINATION:   GENERAL:  89 y.o.-year-old patient with no acute distress.  LUNGS: Normal breath sounds bilaterally, no wheezing CARDIOVASCULAR: S1, S2 normal. No murmur   ABDOMEN: Soft, nontender, nondistended. Bowel sounds present.  EXTREMITIES: No  edema b/l.   Surgical dressing+ NEUROLOGIC: nonfocal  patient is alert and awake  LABORATORY PANEL:  CBC Recent Labs  Lab 08/18/23 0315  WBC 19.0*  HGB 12.0  HCT 35.7*  PLT 219    Chemistries  Recent Labs  Lab 08/16/23 1307 08/17/23 0326  NA 140 137  K 3.4* 3.4*  CL 104 101  CO2 24 25  GLUCOSE 129* 170*  BUN 20 16  CREATININE 0.89 0.72  CALCIUM  8.9 8.1*  AST 23  --   ALT 23  --   ALKPHOS 66  --   BILITOT 0.7  --    Cardiac Enzymes No results for input(s): TROPONINI in the last 168 hours. RADIOLOGY:  DG HIP UNILAT WITH PELVIS 2-3 VIEWS RIGHT Result Date: 08/16/2023 CLINICAL DATA:  Elective surgery.  Right hip. EXAM: DG HIP (WITH OR WITHOUT PELVIS) 2-3V RIGHT COMPARISON:  Pelvis and right hip radiographs 08/16/2023 and CT right hip 08/16/2023 FINDINGS: Images were performed intraoperatively without the presence of a radiologist. The patient is undergoing ORIF of the previously seen subcapital proximal right femoral fracture with 3 longitudinal screws. Total fluoroscopy images: 5 Total fluoroscopy time: 34 seconds Total dose: Radiation Exposure Index (as provided by the fluoroscopic device): 5.1 mGy air Kerma Please see intraoperative findings for further detail. IMPRESSION: Intraoperative fluoroscopy for ORIF of the proximal right femoral fracture. Electronically Signed   By:  Bertina Broccoli M.D.   On: 08/16/2023 17:46   DG C-Arm 1-60 Min-No Report Result Date: 08/16/2023 Fluoroscopy was utilized by the requesting physician.  No radiographic interpretation.   CT Hip Right Wo Contrast Result Date: 08/16/2023 CLINICAL DATA:  Right hip pain after fall. EXAM: CT OF THE RIGHT HIP WITHOUT CONTRAST TECHNIQUE: Multidetector CT imaging of the right hip was performed according to the standard protocol. Multiplanar CT image reconstructions were also generated. RADIATION DOSE REDUCTION: This exam was performed according to the departmental dose-optimization program which includes automated exposure control, adjustment of the mA and/or kV according to patient size and/or use of iterative reconstruction technique. COMPARISON:  Same day radiographs of the right hip dated 08/16/2023. FINDINGS: Bones/Joint/Cartilage Subtle region of cortical and trabecular lucency along the posterior femoral head and neck junction (series 7, image 60), may reflect a nondisplaced fracture. The remainder of the visualized bones are intact. The right femoral head is seated within the acetabulum. Moderate osteoarthritis of the right hip manifested by joint space narrowing and marginal osteophytosis. Ligaments Ligaments are suboptimally evaluated by CT. Muscles and Tendons Muscles are within normal limits. No intramuscular fluid collection or hematoma. Soft tissue No fluid collection or hematoma. No soft tissue mass. Small fat containing right inguinal hernia. IMPRESSION: 1. Subtle region of cortical and trabecular lucency along the right posterior femoral head and neck junction may reflect a nondisplaced fracture. The remainder of the visualized bones are intact 2. Moderate osteoarthritis of the  right hip. Electronically Signed   By: Mannie Seek M.D.   On: 08/16/2023 12:33    Assessment and Plan  Marlisha Vanwyk Andringa is a 89 y.o. female with medical history significant of chronic atrial fibrillation on anticoagulation  with Pradaxa , history of asthma/COPD, Tonna Frederic, hypertension comes to the emergency room after she had an accidental fall at home while she was trying to grab a lipstick sitting on the stool in the bathroom and slid off the stool is a tilted. Patient did hit her head however did not lose consciousness.   Mechanical fall with acute right hip pain suspected femoral/hip fracture -- orthopedic consultation with Dr.Poggi -- PO and PRN IV pain meds ---6/12--POD#1 did well with surgery. PT/OT to see today.  --6/13--POD#2--tolerating PT/OT--awaiting d/c to rehab   History of chronic atrial fibrillation -- continue amiodarone  and metoprolol  -- resumed Pradaxa  --HR stable   Hypertension -- continue metoprolol  and hydrochlorothiazide    Asthma/COPD -- sats stable -- inhalers/nebulizer as needed   Elevated white count -- appears reactive. So far no source of infection identified.  --CBC trending down      Advance Care Planning:   Code Status: Full Code   Consults: ortho  Family Communication: husband  Procedures:  In situ cannulated screw fixation of valgus-impacted right femoral neck fracture  DVT Prophylaxis :Pradaxa  Level of care: Med-Surg Status is: Inpatient Remains inpatient appropriate because:pt medically stable for d/c once bed obtained    TOTAL TIME TAKING CARE OF THIS PATIENT: 30 minutes.  >50% time spent on counselling and coordination of care  Note: This dictation was prepared with Dragon dictation along with smaller phrase technology. Any transcriptional errors that result from this process are unintentional.  Melvinia Stager M.D    [facility]   CC: Primary care physician; Sari Cunning, MD

## 2023-08-18 NOTE — Plan of Care (Signed)

## 2023-08-19 DIAGNOSIS — S7291XS Unspecified fracture of right femur, sequela: Secondary | ICD-10-CM | POA: Diagnosis not present

## 2023-08-19 DIAGNOSIS — M25551 Pain in right hip: Secondary | ICD-10-CM | POA: Diagnosis not present

## 2023-08-19 DIAGNOSIS — J449 Chronic obstructive pulmonary disease, unspecified: Secondary | ICD-10-CM | POA: Diagnosis not present

## 2023-08-19 DIAGNOSIS — I10 Essential (primary) hypertension: Secondary | ICD-10-CM | POA: Diagnosis not present

## 2023-08-19 MED ORDER — HYDROCODONE-ACETAMINOPHEN 5-325 MG PO TABS: 1.0000 | ORAL_TABLET | ORAL | 0 refills | Status: DC | PRN

## 2023-08-19 NOTE — Discharge Instructions (Signed)
 INSTRUCTIONS AFTER Surgery  Remove items at home which could result in a fall. This includes throw rugs or furniture in walking pathways ICE to the affected joint every three hours while awake for 30 minutes at a time, for at least the first 3-5 days, and then as needed for pain and swelling.  Continue to use ice for pain and swelling. You may notice swelling that will progress down to the foot and ankle.  This is normal after surgery.  Elevate your leg when you are not up walking on it.   Continue to use the breathing machine you got in the hospital (incentive spirometer) which will help keep your temperature down.  It is common for your temperature to cycle up and down following surgery, especially at night when you are not up moving around and exerting yourself.  The breathing machine keeps your lungs expanded and your temperature down.   DIET:  As you were doing prior to hospitalization, we recommend a well-balanced diet.  DRESSING / WOUND CARE / SHOWERING  Dressing change as needed.  No showering.  Follow-up with [facility] orth[facility] in 10 to 14 days for staple removal  ACTIVITY  Increase activity slowly as tolerated, but follow the weight bearing instructions below.   No driving for 6 weeks or until further direction given by your physician.  You cannot drive while taking narcotics.  No lifting or carrying greater than 10 lbs. until further directed by your surgeon. Avoid periods of inactivity such as sitting longer than an hour when not asleep. This helps prevent blood clots.  You may return to work once you are authorized by your doctor.     WEIGHT BEARING  Bearing as tolerated on the right   EXERCISES Gait training and ambulation training with physical therapy and Occupational Therapy  CONSTIPATION  Constipation is defined medically as fewer than three stools per week and severe constipation as less than one stool per week.  Even if you have a regular bowel pattern at  home, your normal regimen is likely to be disrupted due to multiple reasons following surgery.  Combination of anesthesia, postoperative narcotics, change in appetite and fluid intake all can affect your bowels.   YOU MUST use at least one of the following options; they are listed in order of increasing strength to get the job done.  They are all available over the counter, and you may need to use some, POSSIBLY even all of these options:    Drink plenty of fluids (prune juice may be helpful) and high fiber foods Colace 100 mg by mouth twice a day  Senokot for constipation as directed and as needed Dulcolax (bisacodyl ), take with full glass of water  Miralax  (polyethylene glycol) once or twice a day as needed.  If you have tried all these things and are unable to have a bowel movement in the first 3-4 days after surgery call either your surgeon or your primary doctor.    If you experience loose stools or diarrhea, hold the medications until you stool forms back up.  If your symptoms do not get better within 1 week or if they get worse, check with your doctor.  If you experience the worst abdominal pain ever or develop nausea or vomiting, please contact the office immediately for further recommendations for treatment.   ITCHING:  If you experience itching with your medications, try taking only a single pain pill, or even half a pain pill at a time.  You can  also use Benadryl  over the counter for itching or also to help with sleep.   TED HOSE STOCKINGS:  Use stockings on both legs until for at least 2 weeks or as directed by physician office. They may be removed at night for sleeping.  MEDICATIONS:  See your medication summary on the "After Visit Summary" that nursing will review with you.  You may have some home medications which will be placed on hold until you complete the course of blood thinner medication.  It is important for you to complete the blood thinner medication as  prescribed.  PRECAUTIONS:  If you experience chest pain or shortness of breath - call 911 immediately for transfer to the hospital emergency department.   If you develop a fever greater that 101 F, purulent drainage from wound, increased redness or drainage from wound, foul odor from the wound/dressing, or calf pain - CONTACT YOUR SURGEON.                                                   FOLLOW-UP APPOINTMENTS:  If you do not already have a post-op appointment, please call the office for an appointment to be seen by your surgeon.  Guidelines for how soon to be seen are listed in your "After Visit Summary", but are typically between 1-4 weeks after surgery.  OTHER INSTRUCTIONS:     MAKE SURE YOU:  Understand these instructions.  Get help right away if you are not doing well or get worse.    Thank you for letting us  be a part of your medical care team.  It is a privilege we respect greatly.  We hope these instructions will help you stay on track for a fast and full recovery!

## 2023-08-19 NOTE — Progress Notes (Signed)
 Subjective: 3 Days Post-Op Procedure(s) (LRB): FIXATION, FEMUR, NECK, PERCUTANEOUS, USING SCREW (Right) Patient reports pain as mild.   Patient is well, and has had no acute complaints or problems PT and care management to assist with discharge planning.  Possible d/c to SNF. Negative for chest pain and shortness of breath Fever: no Gastrointestinal:Negative for nausea and vomiting Reports she is passing some gas.  No BM yet.  Objective: Vital signs in last 24 hours: Temp:  [97.5 F (36.4 C)-98.5 F (36.9 C)] 97.8 F (36.6 C) (06/14 0339) Pulse Rate:  [63-78] 78 (06/14 0339) Resp:  [16-19] 18 (06/14 0339) BP: (128-145)/(48-61) 143/61 (06/14 0339) SpO2:  [90 %-95 %] 92 % (06/14 0339)  Intake/Output from previous day:  Intake/Output Summary (Last 24 hours) at 08/19/2023 0714 Last data filed at 08/19/2023 0620 Gross per 24 hour  Intake 795 ml  Output 50 ml  Net 745 ml    Intake/Output this shift: No intake/output data recorded.  Labs: Recent Labs    08/16/23 1307 08/17/23 0326 08/18/23 0315  HGB 15.0 12.5 12.0   Recent Labs    08/17/23 0326 08/18/23 0315  WBC 18.2* 19.0*  RBC 4.23 4.05  HCT 37.1 35.7*  PLT 228 219   Recent Labs    08/16/23 1307 08/17/23 0326  NA 140 137  K 3.4* 3.4*  CL 104 101  CO2 24 25  BUN 20 16  CREATININE 0.89 0.72  GLUCOSE 129* 170*  CALCIUM  8.9 8.1*   No results for input(s): LABPT, INR in the last 72 hours.   EXAM General - Patient is Alert, Appropriate, and Oriented Extremity - ABD soft Neurovascular intact Dorsiflexion/Plantar flexion intact Incision: moderate drainage No cellulitis present Compartment soft Dressing/Incision - No drainage noted to the right hip honeycomb dressing. Motor Function - intact, moving foot and toes well on exam.  Abdomen soft with intact bowel sounds.  Past Medical History:  Diagnosis Date   Acquired thrombophilia (HCC)    Acute metabolic encephalopathy 06/19/2020   Allergy     Atherosclerosis of aorta (HCC)    Atrial fibrillation (HCC)    Bronchiectasis (HCC)    Cancer (HCC)    left breast cancer    Chronic nausea    COPD (chronic obstructive pulmonary disease) (HCC)    GERD (gastroesophageal reflux disease)    Grade II diastolic dysfunction    Hyperlipemia    Hypertension    Intractable nausea and vomiting 06/20/2018   Mild concentric left ventricular hypertrophy (LVH)    Mild mitral stenosis by prior echocardiogram    Mild pulmonary hypertension (HCC)    Psoriatic arthritis (HCC)    Stage 2 moderate COPD by GOLD classification (HCC)     Assessment/Plan: 3 Days Post-Op Procedure(s) (LRB): FIXATION, FEMUR, NECK, PERCUTANEOUS, USING SCREW (Right) Principal Problem:   Hip pain, acute, right Active Problems:   Primary hypertension   Chronic obstructive pulmonary disease (HCC)   Closed fracture of right femur, unspecified fracture morphology, sequela  Estimated body mass index is 21.03 kg/m as calculated from the following:   Height as of this encounter: 5' 2 (1.575 m).   Weight as of this encounter: 52.2 kg. Advance diet Up with therapy D/C IV fluids when tolerating po intake.  Labs and vitals reviewed.  WBC 19.0.  No signs of infection to the right hip. Up with PT.   Continue to work on a BM.  Following discharge, follow-up with [facility] in 10-14 days for staple removal and x-rays of the  right hip.  DVT Prophylaxis - TED hose and Pradaxa  50% weightbearing to the right leg.  Bert Britain, PA-C [facility] 08/19/2023, 7:14 AM

## 2023-08-19 NOTE — Progress Notes (Signed)
 [facility]  - [place] at [facility]   PATIENT NAME: Pami Wool    MR#:  [id number]  DATE OF BIRTH:  12-Jan-1934  SUBJECTIVE:  No issues. Tolerating PT well. Minimal pain. No fever Husband at bedside  VITALS:  Blood pressure (!) 136/58, pulse 73, temperature 98.6 F (37 C), temperature source Oral, resp. rate 15, height 5' 2 (1.575 m), weight 52.2 kg, SpO2 94%.  PHYSICAL EXAMINATION:   GENERAL:  89 y.o.-year-old patient with no acute distress.  LUNGS: Normal breath sounds bilaterally, no wheezing CARDIOVASCULAR: S1, S2 normal. No murmur   ABDOMEN: Soft, nontender, nondistended. Bowel sounds present.  EXTREMITIES: No  edema b/l.   Surgical dressing+ NEUROLOGIC: nonfocal  patient is alert and awake  LABORATORY PANEL:  CBC Recent Labs  Lab 08/18/23 0315  WBC 19.0*  HGB 12.0  HCT 35.7*  PLT 219    Chemistries  Recent Labs  Lab 08/16/23 1307 08/17/23 0326  NA 140 137  K 3.4* 3.4*  CL 104 101  CO2 24 25  GLUCOSE 129* 170*  BUN 20 16  CREATININE 0.89 0.72  CALCIUM  8.9 8.1*  AST 23  --   ALT 23  --   ALKPHOS 66  --   BILITOT 0.7  --     Assessment and Plan  Kyrra P Mondesir is a 89 y.o. female with medical history significant of chronic atrial fibrillation on anticoagulation with Pradaxa , history of asthma/COPD, Tonna Frederic, hypertension comes to the emergency room after she had an accidental fall at home while she was trying to grab a lipstick sitting on the stool in the bathroom and slid off the stool is a tilted. Patient did hit her head however did not lose consciousness.   Mechanical fall with acute right hip pain suspected femoral/hip fracture -- orthopedic consultation with Dr.Poggi -- PO and PRN IV pain meds ---6/12--POD#1 did well with surgery. PT/OT to see today.  --6/13--POD#2--tolerating PT/OT--awaiting d/c to rehab --6/14 POD#3 doing well. No fever. Has elevated WBC. Will check UC. --pt has pattern of elevated WBC in the past --so far  stable. Will cont to monitor   History of chronic atrial fibrillation -- continue amiodarone  and metoprolol  -- resumed Pradaxa  --HR stable   Hypertension -- continue metoprolol  and hydrochlorothiazide    Asthma/COPD -- sats stable -- inhalers/nebulizer as needed   Elevated white count -- appears reactive. So far no source of infection identified.  --CBC trending down      Advance Care Planning:   Code Status: Full Code   Consults: ortho  Family Communication: husband  Procedures:  In situ cannulated screw fixation of valgus-impacted right femoral neck fracture  DVT Prophylaxis :Pradaxa  Level of care: Med-Surg Status is: Inpatient Remains inpatient appropriate because:pt medically stable for d/c once bed obtained    TOTAL TIME TAKING CARE OF THIS PATIENT: 30 minutes.  >50% time spent on counselling and coordination of care  Note: This dictation was prepared with Dragon dictation along with smaller phrase technology. Any transcriptional errors that result from this process are unintentional.  Melvinia Stager M.D    [facility]   CC: Primary care physician; Sari Cunning, MD

## 2023-08-19 NOTE — Plan of Care (Signed)

## 2023-08-19 NOTE — Progress Notes (Signed)
 Occupational Therapy Treatment Patient Details Name: [name] MRN: [id number] DOB: Aug 22, 1933 Today's Date: 08/19/2023   History of present illness [name] is a 89 y.o. female  with history of HTN, atrial fibrillation on Pradaxa , COPD and presented to the emergency department for treatment and evaluation of right hip pain after mechanical, nonsyncopal fall.Imaging showed valgus impacted R femoral neck fracture. Patient is s/p in situ cannulated screw fixation of fracture. Partial WB on RLE.   OT comments  Pt seen for OT treatment session  this date with focus on bed mobility, transfers and grooming while standing at the sink.  Pt states, This is the most I have done!  Reports she felt much better after session.  Pt requires mod assist with bed mobility to manage lower extremity.  Min assist this date for sit to stand transfer and cues for arm placement.  IV placement in right arm hinders use with transfers and use of AD.  Pt able to ambulate with RW with min assist and cues for PWB to sink for grooming in standing.  Pt able to shift weight with cues to utilize alternating UEs to complete oral care and grooming tasks.  Mod assist from sit to supine and to readjust in bed.  She progressed well this session and required less assistance with sit to stand transfers, benefits from assistance and cues to maintain PWB status.  Continue to work towards goals in plan of care to maximize safety and independence in necessary daily tasks.       If plan is discharge home, recommend the following:  A lot of help with walking and/or transfers;A lot of help with bathing/dressing/bathroom;Assistance with cooking/housework;Help with stairs or ramp for entrance;Assist for transportation   Equipment Recommendations       Recommendations for Other Services      Precautions / Restrictions Precautions Precautions: Fall Recall of Precautions/Restrictions: Impaired Restrictions Weight Bearing Restrictions  Per Provider Order: Yes RLE Weight Bearing Per Provider Order: Partial weight bearing RLE Partial Weight Bearing Percentage or Pounds: 50%       Mobility Bed Mobility Overal bed mobility: Needs Assistance Bed Mobility: Supine to Sit     Supine to sit: Mod assist     General bed mobility comments: assist for trunk and B LE's; assist to scoot to edge of bed    Transfers Overall transfer level: Needs assistance Equipment used: Rolling walker (2 wheels) Transfers: Sit to/from Stand Sit to Stand: Min assist     Step pivot transfers: Min assist     General transfer comment: vc's for UE positioning/placement; assist to initiate and come to full stand and steady; assist to control descent sitting     Balance Overall balance assessment: Needs assistance Sitting-balance support: No upper extremity supported, Feet supported Sitting balance-Leahy Scale: Good Sitting balance - Comments: steady reaching within BOS   Standing balance support: Bilateral upper extremity supported, During functional activity, Reliant on assistive device for balance Standing balance-Leahy Scale: Fair Standing balance comment: steady static standing with B UE support on RW                           ADL either performed or assessed with clinical judgement   ADL Overall ADL's : Needs assistance/impaired Eating/Feeding: Sitting;Independent   Grooming: Standing;Oral care;Wash/dry face;Wash/dry hands;Contact guard assist;Minimal assistance  Extremity/Trunk Assessment Upper Extremity Assessment Upper Extremity Assessment: Generalized weakness            Vision       Perception     Praxis     Communication Communication Communication: No apparent difficulties   Cognition Arousal: Alert Behavior During Therapy: WFL for tasks assessed/performed                                 Following commands: Intact         Cueing   Cueing Techniques: Verbal cues, Tactile cues  Exercises      Shoulder Instructions       General Comments      Pertinent Vitals/ Pain       Pain Assessment Pain Score: 4  Pain Location: R hip Pain Descriptors / Indicators: Discomfort, Sore Pain Intervention(s): Limited activity within patient's tolerance, Monitored during session, Repositioned  Home Living Family/patient expects to be discharged to:: Private residence Living Arrangements: Spouse/significant other Available Help at Discharge: Family;Available 24 hours/day Type of Home: House Home Access: Stairs to enter [employer] of Steps: 5 Entrance Stairs-Rails: Right;Left;Can reach both Home Layout: Able to live on main level with bedroom/bathroom;Two level     Bathroom Shower/Tub: Walk-in Pensions consultant: Handicapped height Bathroom Accessibility: Yes   Home Equipment: Grab bars - toilet;Grab bars - tub/shower;Cane - single Librarian, academic (2 wheels);Transport chair;Shower seat - built in          Prior Functioning/Environment              Frequency  Min 2X/week        Progress Toward Goals  OT Goals(current goals can now be found in the care plan section)  Progress towards OT goals: Progressing toward goals  Acute Rehab OT Goals OT Goal Formulation: With patient Time For Goal Achievement: 08/31/23 Potential to Achieve Goals: Good  Plan      Co-evaluation                 AM-PAC OT 6 Clicks Daily Activity     Outcome Measure   Help from another person eating meals?: None Help from another person taking care of personal grooming?: A Little Help from another person toileting, which includes using toliet, bedpan, or urinal?: A Little Help from another person bathing (including washing, rinsing, drying)?: A Lot Help from another person to put on and taking off regular upper body clothing?: A Little Help from another person to put on and  taking off regular lower body clothing?: A Lot 6 Click Score: 17    End of Session Equipment Utilized During Treatment: Gait belt;Rolling walker (2 wheels);Oxygen   OT Visit Diagnosis: Unsteadiness on feet (R26.81);Muscle weakness (generalized) (M62.81);Pain Pain - Right/Left: Right Pain - part of body: Hip   Activity Tolerance Patient tolerated treatment well   Patient Left with family/visitor present;in bed;with bed alarm set;with call bell/phone within reach   Nurse Communication          Time: 1430-1456 OT Time Calculation (min): 26 min  Charges: OT General Charges $OT Visit: 1 Visit OT Treatments $Self Care/Home Management : 23-37 mins  [name], OTR/L, CLT   Kayvon Mo 08/19/2023, 5:00 PM

## 2023-08-19 NOTE — Progress Notes (Signed)
 Physical Therapy Treatment Patient Details Name: [name] MRN: [id number] DOB: 09-22-1933 Today's Date: 08/19/2023   History of Present Illness [name] is a 89 y.o. female  with history of HTN, atrial fibrillation on Pradaxa , COPD and presented to the emergency department for treatment and evaluation of right hip pain after mechanical, nonsyncopal fall.Imaging showed valgus impacted R femoral neck fracture. Patient is s/p in situ cannulated screw fixation of fracture. Partial WB on RLE.    PT Comments  Pt resting in bed upon PT arrival; pt's husband present; pt agreeable to therapy.  R hip pain 0/10 at rest beginning/end of session but increased to 5/10 with activity.  PWB'ing precautions reviewed with pt (pt would benefit from continued review).  During session pt was mod assist semi-supine to sitting EOB; min to mod assist to stand from bed up to RW; and CGA to min assist to ambulate 8 feet with RW use (limited d/t pt fatigue and ability to maintain PWB'ing status).  No N/V noted during session.  Post ambulation pt's HR 81 bpm and SpO2 sats 90-91% on room air at rest (nurse notified).  Will continue to focus on strengthening, balance, and progressive functional mobility during hospitalization.   If plan is discharge home, recommend the following: A lot of help with walking and/or transfers;Assist for transportation;Help with stairs or ramp for entrance;A little help with bathing/dressing/bathroom   Can travel by private vehicle     No  Equipment Recommendations  Other (comment) (TBD at next facility)    Recommendations for Other Services       Precautions / Restrictions Precautions Precautions: Fall Recall of Precautions/Restrictions: Impaired Restrictions Weight Bearing Restrictions Per Provider Order: Yes RLE Weight Bearing Per Provider Order: Partial weight bearing RLE Partial Weight Bearing Percentage or Pounds: 50%     Mobility  Bed Mobility Overal bed mobility:  Needs Assistance Bed Mobility: Supine to Sit     Supine to sit: Mod assist     General bed mobility comments: assist for trunk and B LE's; assist to scoot to edge of bed    Transfers Overall transfer level: Needs assistance Equipment used: Rolling walker (2 wheels) Transfers: Sit to/from Stand Sit to Stand: Min assist, Mod assist           General transfer comment: vc's for UE positioning/placement; assist to initiate and come to full stand and steady; assist to control descent sitting    Ambulation/Gait Ambulation/Gait assistance: Contact guard assist, Min assist Gait Distance (Feet): 8 Feet Assistive device: Rolling walker (2 wheels) Gait Pattern/deviations: Step-to pattern Gait velocity: decreased     General Gait Details: small B LE step length; vc's to increase UE support through RW to offweight R LE for PWB'ing precautions; limited distance d/t pt fatigue   Stairs             Wheelchair Mobility     Tilt Bed    Modified Rankin (Stroke Patients Only)       Balance Overall balance assessment: Needs assistance Sitting-balance support: No upper extremity supported, Feet supported Sitting balance-Leahy Scale: Good Sitting balance - Comments: steady reaching within BOS   Standing balance support: Bilateral upper extremity supported, During functional activity, Reliant on assistive device for balance Standing balance-Leahy Scale: Fair Standing balance comment: steady static standing with B UE support on RW  Communication Communication Communication: No apparent difficulties  Cognition Arousal: Alert Behavior During Therapy: WFL for tasks assessed/performed   PT - Cognitive impairments: No apparent impairments                       PT - Cognition Comments: Pt is A and O x 4 Following commands: Intact      Cueing Cueing Techniques: Verbal cues, Tactile cues  Exercises      General Comments   Nursing cleared pt for participation in physical therapy.  Pt agreeable to PT session.      Pertinent Vitals/Pain Pain Assessment Pain Assessment: 0-10 Pain Score: 0-No pain (0/10 at rest beginning/end of session; 5/10 with activity) Pain Location: R hip Pain Descriptors / Indicators: Discomfort, Sore Pain Intervention(s): Limited activity within patient's tolerance, Monitored during session, Repositioned, Other (comment) (RN updated regarding pt's pain status)    Home Living                          Prior Function            PT Goals (current goals can now be found in the care plan section) Acute Rehab PT Goals Patient Stated Goal: less pain and nasuea PT Goal Formulation: With patient Time For Goal Achievement: 08/31/23 Potential to Achieve Goals: Good Progress towards PT goals: Progressing toward goals    Frequency    7X/week      PT Plan      Co-evaluation              AM-PAC PT 6 Clicks Mobility   Outcome Measure  Help needed turning from your back to your side while in a flat bed without using bedrails?: A Little Help needed moving from lying on your back to sitting on the side of a flat bed without using bedrails?: A Lot Help needed moving to and from a bed to a chair (including a wheelchair)?: A Little Help needed standing up from a chair using your arms (e.g., wheelchair or bedside chair)?: A Lot Help needed to walk in hospital room?: A Lot Help needed climbing 3-5 steps with a railing? : Total 6 Click Score: 13    End of Session Equipment Utilized During Treatment: Gait belt Activity Tolerance: Patient tolerated treatment well Patient left: in chair;with call bell/phone within reach;with chair alarm set;with family/visitor present;with SCD's reapplied Nurse Communication: Mobility status;Precautions;Weight bearing status;Other (comment) (Pt's SpO2 sats 90-91% on room air end of session) PT Visit Diagnosis: Unsteadiness on feet  (R26.81);Other abnormalities of gait and mobility (R26.89);History of falling (Z91.81);Muscle weakness (generalized) (M62.81);Difficulty in walking, not elsewhere classified (R26.2)     Time: 8119-1478 PT Time Calculation (min) (ACUTE ONLY): 24 min  Charges:    $Gait Training: 8-22 mins $Therapeutic Activity: 8-22 mins PT General Charges $$ ACUTE PT VISIT: 1 Visit                     Amador Junes, PT 08/19/23, 9:26 AM

## 2023-08-20 DIAGNOSIS — I10 Essential (primary) hypertension: Secondary | ICD-10-CM | POA: Diagnosis not present

## 2023-08-20 DIAGNOSIS — M25551 Pain in right hip: Secondary | ICD-10-CM | POA: Diagnosis not present

## 2023-08-20 DIAGNOSIS — J449 Chronic obstructive pulmonary disease, unspecified: Secondary | ICD-10-CM | POA: Diagnosis not present

## 2023-08-20 DIAGNOSIS — S7291XS Unspecified fracture of right femur, sequela: Secondary | ICD-10-CM | POA: Diagnosis not present

## 2023-08-20 MED ORDER — LORAZEPAM 0.5 MG PO TABS
0.5000 mg | ORAL_TABLET | Freq: Every day | ORAL | Status: DC
  Administered 2023-08-20: 0.5 mg via ORAL
  Filled 2023-08-20: qty 1

## 2023-08-20 NOTE — Progress Notes (Signed)
 [facility]  - [place] at [facility]   PATIENT NAME: [name]    MR#:  [id number]  DATE OF BIRTH:  1934-03-05  SUBJECTIVE:  No issues. Tolerating PT well. Minimal pain. No fever. Denies dysuria or freq Husband at bedside  VITALS:  Blood pressure (!) 151/66, pulse 71, temperature 97.8 F (36.6 C), resp. rate 17, height 5' 2 (1.575 m), weight 52.2 kg, SpO2 93%.  PHYSICAL EXAMINATION:   GENERAL:  89 y.o.-year-old patient with no acute distress.  LUNGS: Normal breath sounds bilaterally, no wheezing CARDIOVASCULAR: S1, S2 normal. No murmur   ABDOMEN: Soft, nontender, nondistended. Bowel sounds present.  EXTREMITIES: No  edema b/l.   Surgical dressing+ NEUROLOGIC: nonfocal  patient is alert and awake  LABORATORY PANEL:  CBC Recent Labs  Lab 08/18/23 0315  WBC 19.0*  HGB 12.0  HCT 35.7*  PLT 219    Chemistries  Recent Labs  Lab 08/16/23 1307 08/17/23 0326  NA 140 137  K 3.4* 3.4*  CL 104 101  CO2 24 25  GLUCOSE 129* 170*  BUN 20 16  CREATININE 0.89 0.72  CALCIUM  8.9 8.1*  AST 23  --   ALT 23  --   ALKPHOS 66  --   BILITOT 0.7  --     Assessment and Plan  [name] is a 89 y.o. female with medical history significant of chronic atrial fibrillation on anticoagulation with Pradaxa , history of asthma/COPD, Tonna Frederic, hypertension comes to the emergency room after she had an accidental fall at home while she was trying to grab a lipstick sitting on the stool in the bathroom and slid off the stool is a tilted. Patient did hit her head however did not lose consciousness.   Mechanical fall with acute right hip pain suspected femoral/hip fracture -- orthopedic consultation with Dr.Poggi -- PO and PRN IV pain meds ---6/12--POD#1 did well with surgery. PT/OT to see today.  --6/13--POD#2--tolerating PT/OT--awaiting d/c to rehab --6/14 POD#3 doing well. No fever. Has elevated WBC. Will check UC. --pt has pattern of elevated WBC in the past --so far  stable. Will cont to monitor --6/15--POD#4--doing well    History of chronic atrial fibrillation -- continue amiodarone  and metoprolol  -- resumed Pradaxa  --HR stable   Hypertension -- continue metoprolol  and hydrochlorothiazide    Asthma/COPD -- sats stable -- inhalers/nebulizer as needed   Elevated white count -- appears reactive. So far no source of infection identified.  --CBC trending down -UC pending      Advance Care Planning:   Code Status: Full Code   Consults: ortho  Family Communication: husband  Procedures:  In situ cannulated screw fixation of valgus-impacted right femoral neck fracture  DVT Prophylaxis :Pradaxa  Level of care: Med-Surg Status is: Inpatient Remains inpatient appropriate because:pt medically stable for d/c once bed obtained    TOTAL TIME TAKING CARE OF THIS PATIENT: 30 minutes.  >50% time spent on counselling and coordination of care  Note: This dictation was prepared with Dragon dictation along with smaller phrase technology. Any transcriptional errors that result from this process are unintentional.  Melvinia Stager M.D    [facility]   CC: Primary care physician; Sari Cunning, MD

## 2023-08-20 NOTE — Progress Notes (Signed)
  Subjective: 4 Days Post-Op Procedure(s) (LRB): FIXATION, FEMUR, NECK, PERCUTANEOUS, USING SCREW (Right) Patient reports pain as mild.   Patient is well, and has had no acute complaints or problems PT and care management to assist with discharge planning.  Possible d/c to SNF. Negative for chest pain and shortness of breath Fever: no Gastrointestinal:Negative for nausea and vomiting Reports she is passing some gas.  No BM yet.  Objective: Vital signs in last 24 hours: Temp:  [97.8 F (36.6 C)-98.6 F (37 C)] 97.8 F (36.6 C) (06/15 0332) Pulse Rate:  [71-76] 71 (06/15 0332) Resp:  [15-18] 18 (06/14 2001) BP: (136-164)/(52-67) 164/67 (06/15 0332) SpO2:  [91 %-94 %] 91 % (06/15 0332)  Intake/Output from previous day:  Intake/Output Summary (Last 24 hours) at 08/20/2023 0709 Last data filed at 08/19/2023 2110 Gross per 24 hour  Intake 460 ml  Output 300 ml  Net 160 ml    Intake/Output this shift: No intake/output data recorded.  Labs: Recent Labs    08/18/23 0315  HGB 12.0   Recent Labs    08/18/23 0315  WBC 19.0*  RBC 4.05  HCT 35.7*  PLT 219   No results for input(s): NA, K, CL, CO2, BUN, CREATININE, GLUCOSE, CALCIUM  in the last 72 hours.  No results for input(s): LABPT, INR in the last 72 hours.   EXAM General - Patient is Alert, Appropriate, and Oriented Extremity - ABD soft Neurovascular intact Dorsiflexion/Plantar flexion intact Incision: moderate drainage No cellulitis present Compartment soft Dressing/Incision - No drainage noted to the right hip honeycomb dressing. Motor Function - intact, moving foot and toes well on exam. Ambulated 8 feet with PT. Abdomen soft with intact bowel sounds.  Past Medical History:  Diagnosis Date   Acquired thrombophilia (HCC)    Acute metabolic encephalopathy 06/19/2020   Allergy    Atherosclerosis of aorta (HCC)    Atrial fibrillation (HCC)    Bronchiectasis (HCC)    Cancer (HCC)    left  breast cancer    Chronic nausea    COPD (chronic obstructive pulmonary disease) (HCC)    GERD (gastroesophageal reflux disease)    Grade II diastolic dysfunction    Hyperlipemia    Hypertension    Intractable nausea and vomiting 06/20/2018   Mild concentric left ventricular hypertrophy (LVH)    Mild mitral stenosis by prior echocardiogram    Mild pulmonary hypertension (HCC)    Psoriatic arthritis (HCC)    Stage 2 moderate COPD by GOLD classification (HCC)     Assessment/Plan: 4 Days Post-Op Procedure(s) (LRB): FIXATION, FEMUR, NECK, PERCUTANEOUS, USING SCREW (Right) Principal Problem:   Hip pain, acute, right Active Problems:   Primary hypertension   Chronic obstructive pulmonary disease (HCC)   Closed fracture of right femur, unspecified fracture morphology, sequela  Estimated body mass index is 21.03 kg/m as calculated from the following:   Height as of this encounter: 5' 2 (1.575 m).   Weight as of this encounter: 52.2 kg. Advance diet Up with therapy D/C IV fluids when tolerating po intake.  Labs and vitals reviewed.  WBC 19.0.  No signs of infection to the right hip. Up with PT.   Continue to work on a BM.  Following discharge, follow-up with [facility] in 10-14 days for staple removal and x-rays of the right hip.  DVT Prophylaxis - TED hose and Pradaxa  50% weightbearing to the right leg.  Bert Britain, PA-C [facility] 08/20/2023, 7:09 AM

## 2023-08-20 NOTE — Progress Notes (Signed)
 Physical Therapy Treatment Patient Details Name: [name] MRN: [id number] DOB: 1933/08/03 Today's Date: 08/20/2023   History of Present Illness [name] is a 89 y.o. female  with history of HTN, atrial fibrillation on Pradaxa , COPD and presented to the emergency department for treatment and evaluation of right hip pain after mechanical, nonsyncopal fall.Imaging showed valgus impacted R femoral neck fracture. Patient is s/p in situ cannulated screw fixation of fracture. Partial WB on RLE.    PT Comments  Pt seated EOB upon arrival, in the presence of her husband and agreeable to PT today. PT reinforced PWB precautions and reviewed with pt (continued review of precautions needed for increased recall ability). Pt was able to perform 2x trials of STS to RW (from bed>standing, BSC>standing) with min A for initiation and to maintain precautions, was able to perform a standing pivot transfer with min A and ambulate 28 feet with RW use. Increased fatigue noted with transfers and with increased distance during ambulation. Pt will benefit from skilled PT to address goals and work towards PLOF. R hip pain 0/10 throughout session, incision is dry without noted exudate.     If plan is discharge home, recommend the following: Assist for transportation;Help with stairs or ramp for entrance;A little help with bathing/dressing/bathroom;A little help with walking and/or transfers   Can travel by private vehicle     No  Equipment Recommendations       Recommendations for Other Services       Precautions / Restrictions Precautions Precautions: Fall Recall of Precautions/Restrictions: Impaired Restrictions Weight Bearing Restrictions Per Provider Order: Yes RLE Weight Bearing Per Provider Order: Partial weight bearing RLE Partial Weight Bearing Percentage or Pounds: 50%     Mobility  Bed Mobility               General bed mobility comments: Min A at hips for scooting EOB.     Transfers Overall transfer level: Needs assistance Equipment used: Rolling walker (2 wheels) Transfers: Sit to/from Stand Sit to Stand: Min assist (for increased safety and to maintain PWB precautions)   Step pivot transfers: Min assist (for increased safety and to maintain PWB precautions)       General transfer comment: vc's for UE positioning/ placement and sequencing with precautions; assist to initiate movement to come to standing, and assist to control descent to sitting on BSC.    Ambulation/Gait Ambulation/Gait assistance: Min assist (w/c follow) Gait Distance (Feet): 28 Feet Assistive device: Rolling walker (2 wheels) Gait Pattern/deviations: Step-to pattern, Antalgic, Decreased step length - right, Decreased stance time - right, Decreased step length - left Gait velocity: decreased     General Gait Details: BUE on RW to offload RLE to maintain PWB precautions   Stairs             Wheelchair Mobility     Tilt Bed    Modified Rankin (Stroke Patients Only)       Balance Overall balance assessment: Needs assistance Sitting-balance support: No upper extremity supported, Feet supported Sitting balance-Leahy Scale: Good Sitting balance - Comments: steady reaching within BOS   Standing balance support: Bilateral upper extremity supported, During functional activity, Reliant on assistive device for balance Standing balance-Leahy Scale: Good Standing balance comment: steady static standing with B UE support on RW                            Communication Communication Communication: No apparent difficulties  Cognition Arousal: Alert Behavior During Therapy: WFL for tasks assessed/performed   PT - Cognitive impairments: No apparent impairments                       PT - Cognition Comments: Pt is very pleasant Following commands: Intact      Cueing Cueing Techniques: Verbal cues, Tactile cues  Exercises      General Comments  General comments (skin integrity, edema, etc.): Pre session: HR: 90 bpm SpO2: 92+% on room air, Post session: HR: 90 bpm SpO2: 93+% on room air,      Pertinent Vitals/Pain Pain Assessment Pain Assessment: No/denies pain    Home Living                          Prior Function            PT Goals (current goals can now be found in the care plan section) Acute Rehab PT Goals Patient Stated Goal: less pain and nasuea PT Goal Formulation: With patient Time For Goal Achievement: 08/31/23 Potential to Achieve Goals: Good Progress towards PT goals: Progressing toward goals    Frequency    7X/week      PT Plan      Co-evaluation              AM-PAC PT 6 Clicks Mobility   Outcome Measure  Help needed turning from your back to your side while in a flat bed without using bedrails?: A Little Help needed moving from lying on your back to sitting on the side of a flat bed without using bedrails?: A Little Help needed moving to and from a bed to a chair (including a wheelchair)?: A Little Help needed standing up from a chair using your arms (e.g., wheelchair or bedside chair)?: A Little Help needed to walk in hospital room?: A Little Help needed climbing 3-5 steps with a railing? : Total 6 Click Score: 16    End of Session Equipment Utilized During Treatment: Gait belt Activity Tolerance: Patient tolerated treatment well Patient left: in chair;with call bell/phone within reach;with chair alarm set;with family/visitor present;with SCD's reapplied Nurse Communication: Mobility status;Precautions;Weight bearing status;Other (comment) (Clean catch taken during session for analysis per nurse request) PT Visit Diagnosis: Unsteadiness on feet (R26.81);Other abnormalities of gait and mobility (R26.89);History of falling (Z91.81);Muscle weakness (generalized) (M62.81);Difficulty in walking, not elsewhere classified (R26.2)     Time: 1610-9604 PT Time Calculation (min)  (ACUTE ONLY): 33 min  Charges:                 Freedom Peddy, SPT 08/20/23, 1:29 PM

## 2023-08-20 NOTE — Plan of Care (Signed)

## 2023-08-21 DIAGNOSIS — M25551 Pain in right hip: Secondary | ICD-10-CM | POA: Diagnosis not present

## 2023-08-21 MED ORDER — LORAZEPAM 0.5 MG PO TABS: 0.5000 mg | ORAL_TABLET | Freq: Every day | ORAL | 0 refills | Status: DC

## 2023-08-21 MED ORDER — HYDROCODONE-ACETAMINOPHEN 5-325 MG PO TABS: 1.0000 | ORAL_TABLET | Freq: Four times a day (QID) | ORAL | Status: DC | PRN

## 2023-08-21 MED ORDER — CEPHALEXIN 500 MG PO CAPS
500.0000 mg | ORAL_CAPSULE | Freq: Two times a day (BID) | ORAL | Status: DC
  Administered 2023-08-21: 500 mg via ORAL
  Filled 2023-08-21: qty 1

## 2023-08-21 MED ORDER — CEPHALEXIN 500 MG PO CAPS: 500.0000 mg | ORAL_CAPSULE | Freq: Two times a day (BID) | ORAL | 0 refills | Status: AC

## 2023-08-21 MED ORDER — AMIODARONE HCL 200 MG PO TABS: 200.0000 mg | ORAL_TABLET | Freq: Every day | ORAL | 0 refills | Status: DC

## 2023-08-21 MED ORDER — DOCUSATE SODIUM 100 MG PO CAPS: 100.0000 mg | ORAL_CAPSULE | Freq: Two times a day (BID) | ORAL | 0 refills | Status: AC

## 2023-08-21 NOTE — Discharge Summary (Signed)
 Physician Discharge Summary   Patient: [name] MRN: [id number] DOB: 02-19-1934  Admit date:     08/16/2023  Discharge date: 08/21/23  Discharge Physician: Melvinia Stager   PCP: Sari Cunning, MD   Recommendations at discharge:    F/u PCP Dr Annabell Key in 1-2 weeks F/u Ortho Magalene Schmitt in 2 weeks  Discharge Diagnoses: Principal Problem:   Hip pain, acute, right Active Problems:   Primary hypertension   Chronic obstructive pulmonary disease (HCC)   Closed fracture of right femur, unspecified fracture morphology, sequela  [name] is a 89 y.o. female with medical history significant of chronic atrial fibrillation on anticoagulation with Pradaxa , history of asthma/COPD, Tonna Frederic, hypertension comes to the emergency room after she had an accidental fall at home while she was trying to grab a lipstick sitting on the stool in the bathroom and slid off the stool is a tilted. Patient did hit her head however did not lose consciousness.   Mechanical fall with acute right hip pain suspected femoral/hip fracture -- orthopedic consultation with Dr.Poggi -- PO and PRN IV pain meds ---6/12--POD#1 did well with surgery. PT/OT to see today.  --6/13--POD#2--tolerating PT/OT--awaiting d/c to rehab --6/14 POD#3 doing well. No fever. Has elevated WBC. Will check UC. --pt has pattern of elevated WBC in the past --so far stable. Will cont to monitor --6/15--POD#4--doing well  --6/16--POD#5 no complaints. UC 70K GNR will start po keflex  empiric for UTI   History of chronic atrial fibrillation -- continue amiodarone  and metoprolol  -- resumed Pradaxa  --HR stable   Hypertension -- continue metoprolol  and hydrochlorothiazide  --pt is not on Lasix  at home (per out pt PCP/cardiology notes)   Asthma/COPD -- sats stable -- inhalers/nebulizer as needed   Elevated white count -- appears reactive. So far no source of infection identified.  --CBC trending down -UC 70K GNR--start po keflex   Per TOC  insurance auth obtained      Advance Care Planning:   Code Status: Full Code   Consults: ortho  Family Communication: husband  Procedures: In situ cannulated screw fixation of valgus-impacted right femoral neck fracture  DVT Prophylaxis :Pradaxa      Pain control - [place]  Controlled Substance Reporting System database was reviewed. and patient was instructed, not to drive, operate heavy machinery, perform activities at heights, swimming or participation in water activities or provide baby-sitting services while on Pain, Sleep and Anxiety Medications; until their outpatient Physician has advised to do so again. Also recommended to not to take more than prescribed Pain, Sleep and Anxiety Medications.  Disposition: Skilled nursing facility Diet recommendation:  Discharge Diet Orders (From admission, onward)     Start     Ordered   08/21/23 0000  Diet - low sodium heart healthy        08/21/23 1227           Cardiac diet DISCHARGE MEDICATION: Allergies as of 08/21/2023       Reactions   Amoxicillin  Nausea Only   Eliquis  [apixaban ] Nausea Only        Medication List     PAUSE taking these medications    furosemide  40 MG tablet Wait to take this until your doctor or other care provider tells you to start again. Commonly known as: LASIX  Take 1 tablet (40 mg total) by mouth daily.       TAKE these medications    amiodarone  200 MG tablet Commonly known as: PACERONE  Take 1 tablet (200 mg total) by mouth daily. Start  taking on: August 22, 2023 What changed: See the new instructions.   cephALEXin  500 MG capsule Commonly known as: KEFLEX  Take 1 capsule (500 mg total) by mouth every 12 (twelve) hours for 10 doses.   dabigatran  150 MG Caps capsule Commonly known as: PRADAXA  Take 1 capsule (150 mg total) by mouth every 12 (twelve) hours.   docusate sodium  100 MG capsule Commonly known as: COLACE Take 1 capsule (100 mg total) by mouth 2 (two) times daily.    folic acid  1 MG tablet Commonly known as: FOLVITE  Take 1 mg by mouth daily. Pt takes this med the days she does not take methotrexate    hydrochlorothiazide  25 MG tablet Commonly known as: HYDRODIURIL  Take 1 tablet by mouth daily.   HYDROcodone -acetaminophen  5-325 MG tablet Commonly known as: NORCO/VICODIN Take 1-2 tablets by mouth every 4 (four) hours as needed for moderate pain (pain score 4-6).   LORazepam  0.5 MG tablet Commonly known as: ATIVAN  Take 1 tablet (0.5 mg total) by mouth at bedtime. What changed: when to take this   methotrexate  2.5 MG tablet Commonly known as: RHEUMATREX Take 5 mg by mouth once a week. Caution:Chemotherapy. Protect from light. Pt take 2 weekly.   metoprolol  tartrate 25 MG tablet Commonly known as: LOPRESSOR  Take 1.5 tablets (37.5 mg total) by mouth 2 (two) times daily.   omeprazole  40 MG capsule Commonly known as: PRILOSEC Take 40 mg by mouth daily.   ondansetron  4 MG tablet Commonly known as: ZOFRAN  Take 4 mg by mouth every 8 (eight) hours as needed for nausea or vomiting.   Trelegy Ellipta 100-62.5-25 MCG/ACT Aepb Generic drug: Fluticasone -Umeclidin-Vilant Inhale 1 puff into the lungs daily.        Contact information for follow-up providers     Rojelio Clement, PA-C Follow up in 2 week(s).   Specialty: Physician Assistant Why: For staple removal Contact information: [address] [place] [address] [phone]         Sari Cunning, MD. Schedule an appointment as soon as possible for a visit in 1 week(s).   Specialty: Internal Medicine Contact information: [phone] [facility] [address] [facility] [place] [address] [phone]              Contact information for after-discharge care     Destination     [facility] .   Service: Skilled Nursing Contact information: [address] [place]  [address] [phone]                     Discharge Exam: Filed Weights   08/16/23 1510 08/16/23 1609  Weight: 52.9 kg 52.2 kg   GENERAL:  89 y.o.-year-old patient with no acute distress.  LUNGS: Normal breath sounds bilaterally, no wheezing CARDIOVASCULAR: S1, S2 normal. No murmur   ABDOMEN: Soft, nontender, nondistended. Bowel sounds present.  EXTREMITIES: No  edema b/l.   Surgical dressing+ NEUROLOGIC: nonfocal  patient is alert and awake  Condition at discharge: fair  The results of significant diagnostics from this hospitalization (including imaging, microbiology, ancillary and laboratory) are listed below for reference.   Imaging Studies: DG HIP UNILAT WITH PELVIS 2-3 VIEWS RIGHT Result Date: 08/16/2023 CLINICAL DATA:  Elective surgery.  Right hip. EXAM: DG HIP (WITH OR WITHOUT PELVIS) 2-3V RIGHT COMPARISON:  Pelvis and right hip radiographs 08/16/2023 and CT right hip 08/16/2023 FINDINGS: Images were performed intraoperatively without the presence of a radiologist. The patient is undergoing ORIF of the  previously seen subcapital proximal right femoral fracture with 3 longitudinal screws. Total fluoroscopy images: 5 Total fluoroscopy time: 34 seconds Total dose: Radiation Exposure Index (as provided by the fluoroscopic device): 5.1 mGy air Kerma Please see intraoperative findings for further detail. IMPRESSION: Intraoperative fluoroscopy for ORIF of the proximal right femoral fracture. Electronically Signed   By: Bertina Broccoli M.D.   On: 08/16/2023 17:46   DG C-Arm 1-60 Min-No Report Result Date: 08/16/2023 Fluoroscopy was utilized by the requesting physician.  No radiographic interpretation.   CT Hip Right Wo Contrast Result Date: 08/16/2023 CLINICAL DATA:  Right hip pain after fall. EXAM: CT OF THE RIGHT HIP WITHOUT CONTRAST TECHNIQUE: Multidetector CT imaging of the right hip was performed according to the standard protocol. Multiplanar CT image reconstructions were also generated. RADIATION DOSE REDUCTION: This  exam was performed according to the departmental dose-optimization program which includes automated exposure control, adjustment of the mA and/or kV according to patient size and/or use of iterative reconstruction technique. COMPARISON:  Same day radiographs of the right hip dated 08/16/2023. FINDINGS: Bones/Joint/Cartilage Subtle region of cortical and trabecular lucency along the posterior femoral head and neck junction (series 7, image 60), may reflect a nondisplaced fracture. The remainder of the visualized bones are intact. The right femoral head is seated within the acetabulum. Moderate osteoarthritis of the right hip manifested by joint space narrowing and marginal osteophytosis. Ligaments Ligaments are suboptimally evaluated by CT. Muscles and Tendons Muscles are within normal limits. No intramuscular fluid collection or hematoma. Soft tissue No fluid collection or hematoma. No soft tissue mass. Small fat containing right inguinal hernia. IMPRESSION: 1. Subtle region of cortical and trabecular lucency along the right posterior femoral head and neck junction may reflect a nondisplaced fracture. The remainder of the visualized bones are intact 2. Moderate osteoarthritis of the right hip. Electronically Signed   By: Mannie Seek M.D.   On: 08/16/2023 12:33   DG Hip Unilat W or Wo Pelvis 2-3 Views Right Result Date: 08/16/2023 CLINICAL DATA:  Right hip pain after fall today. EXAM: DG HIP (WITH OR WITHOUT PELVIS) 2-3V RIGHT COMPARISON:  None Available. FINDINGS: There is no evidence of hip fracture or dislocation. There is no evidence of arthropathy or other focal bone abnormality. IMPRESSION: Negative. Electronically Signed   By: Rosalene Colon M.D.   On: 08/16/2023 11:16   CT Head Wo Contrast Result Date: 08/16/2023 CLINICAL DATA:  Trauma, fall, fell off of stool and hit right-side of head. EXAM: CT HEAD WITHOUT CONTRAST CT CERVICAL SPINE WITHOUT CONTRAST TECHNIQUE: Multidetector CT imaging of the  head and cervical spine was performed following the standard protocol without intravenous contrast. Multiplanar CT image reconstructions of the cervical spine were also generated. RADIATION DOSE REDUCTION: This exam was performed according to the departmental dose-optimization program which includes automated exposure control, adjustment of the mA and/or kV according to patient size and/or use of iterative reconstruction technique. COMPARISON:  CT head 06/05/2020. FINDINGS: CT HEAD FINDINGS Brain: No acute intracranial hemorrhage. Redemonstrated remote lacunar infarct versus prominent perivascular space in the inferior right basal ganglia. Increased heterogeneity of the basal ganglia particularly on the left likely related to chronic microvascular changes. Nonspecific hypoattenuation in the periventricular and subcortical white matter favored to reflect chronic microvascular ischemic changes. Mild parenchymal volume loss. No edema, mass effect, or midline shift. The basilar cisterns are patent. Ventricles: The ventricles are normal. Vascular: Atherosclerotic calcifications of the carotid siphons and intracranial vertebral arteries. No hyperdense vessel. Skull: No acute  or aggressive finding. Orbits: Orbits are symmetric. Sinuses: The visualized paranasal sinuses are clear. Other: Mastoid air cells are clear. CT CERVICAL SPINE FINDINGS Alignment: Cervical lordosis is maintained. Trace retrolisthesis of C4 on C5. No facet subluxation or dislocation. Skull base and vertebrae: No compression fracture or displaced fracture in the cervical spine. No suspicious osseous lesion. Chronic appearing deformity of the medial left clavicle. Soft tissues and spinal canal: No prevertebral fluid or swelling. No visible canal hematoma. Disc levels: Intervertebral disc space narrowing greatest at C4-5. Disc osteophyte complexes at multiple levels. No high-grade osseous spinal canal stenosis. Facet arthrosis and uncovertebral  hypertrophy throughout the cervical spine. Foraminal stenosis most pronounced on the right at C6-7. Upper chest: Biapical pleuroparenchymal scarring and pleural calcifications. Other: None IMPRESSION: No CT evidence of acute intracranial abnormality. No acute fracture or traumatic malalignment of the cervical spine. Chronic appearing deformity of the medial left clavicle. Redemonstrated chronic microvascular ischemic changes. Degenerative changes of the cervical spine as above. Electronically Signed   By: Denny Flack M.D.   On: 08/16/2023 11:07   CT Cervical Spine Wo Contrast Result Date: 08/16/2023 CLINICAL DATA:  Trauma, fall, fell off of stool and hit right-side of head. EXAM: CT HEAD WITHOUT CONTRAST CT CERVICAL SPINE WITHOUT CONTRAST TECHNIQUE: Multidetector CT imaging of the head and cervical spine was performed following the standard protocol without intravenous contrast. Multiplanar CT image reconstructions of the cervical spine were also generated. RADIATION DOSE REDUCTION: This exam was performed according to the departmental dose-optimization program which includes automated exposure control, adjustment of the mA and/or kV according to patient size and/or use of iterative reconstruction technique. COMPARISON:  CT head 06/05/2020. FINDINGS: CT HEAD FINDINGS Brain: No acute intracranial hemorrhage. Redemonstrated remote lacunar infarct versus prominent perivascular space in the inferior right basal ganglia. Increased heterogeneity of the basal ganglia particularly on the left likely related to chronic microvascular changes. Nonspecific hypoattenuation in the periventricular and subcortical white matter favored to reflect chronic microvascular ischemic changes. Mild parenchymal volume loss. No edema, mass effect, or midline shift. The basilar cisterns are patent. Ventricles: The ventricles are normal. Vascular: Atherosclerotic calcifications of the carotid siphons and intracranial vertebral arteries. No  hyperdense vessel. Skull: No acute or aggressive finding. Orbits: Orbits are symmetric. Sinuses: The visualized paranasal sinuses are clear. Other: Mastoid air cells are clear. CT CERVICAL SPINE FINDINGS Alignment: Cervical lordosis is maintained. Trace retrolisthesis of C4 on C5. No facet subluxation or dislocation. Skull base and vertebrae: No compression fracture or displaced fracture in the cervical spine. No suspicious osseous lesion. Chronic appearing deformity of the medial left clavicle. Soft tissues and spinal canal: No prevertebral fluid or swelling. No visible canal hematoma. Disc levels: Intervertebral disc space narrowing greatest at C4-5. Disc osteophyte complexes at multiple levels. No high-grade osseous spinal canal stenosis. Facet arthrosis and uncovertebral hypertrophy throughout the cervical spine. Foraminal stenosis most pronounced on the right at C6-7. Upper chest: Biapical pleuroparenchymal scarring and pleural calcifications. Other: None IMPRESSION: No CT evidence of acute intracranial abnormality. No acute fracture or traumatic malalignment of the cervical spine. Chronic appearing deformity of the medial left clavicle. Redemonstrated chronic microvascular ischemic changes. Degenerative changes of the cervical spine as above. Electronically Signed   By: Denny Flack M.D.   On: 08/16/2023 11:07    Microbiology: Results for orders placed or performed during the hospital encounter of 08/16/23  Urine Culture (for pregnant, neutropenic or urologic patients or patients with an indwelling urinary catheter)     Status: Abnormal (  Preliminary result)   Collection Time: 08/20/23 10:41 AM   Specimen: Urine, Clean Catch  Result Value Ref Range Status   Specimen Description   Final    URINE, CLEAN CATCH Performed at [facility], [address] [place], [place] [address]    Special Requests   Final    NONE Performed at [facility], [address], [place], [place]  [address]    Culture (A)  Final    70,000 COLONIES/mL Hillis Lu NEGATIVE RODS SUSCEPTIBILITIES TO FOLLOW Performed at [facility], [address], [place], [place] [address]    Report Status PENDING  Incomplete    Labs: CBC: Recent Labs  Lab 08/16/23 1307 08/17/23 0326 08/18/23 0315  WBC 26.1* 18.2* 19.0*  NEUTROABS 22.5*  --   --   HGB 15.0 12.5 12.0  HCT 46.2* 37.1 35.7*  MCV 89.9 87.7 88.1  PLT 248 228 219   Basic Metabolic Panel: Recent Labs  Lab 08/16/23 1307 08/17/23 0326  NA 140 137  K 3.4* 3.4*  CL 104 101  CO2 24 25  GLUCOSE 129* 170*  BUN 20 16  CREATININE 0.89 0.72  CALCIUM  8.9 8.1*   Liver Function Tests: Recent Labs  Lab 08/16/23 1307  AST 23  ALT 23  ALKPHOS 66  BILITOT 0.7  PROT 7.3  ALBUMIN 3.5    Discharge time spent: greater than 30 minutes.  Signed: Melvinia Stager, MD [facility] 08/21/2023

## 2023-08-21 NOTE — TOC Transition Note (Signed)
 Transition of Care [facility]) - Discharge Note   Patient Details  Name: [name] MRN: [id number] Date of Birth: Apr 13, 1933  [facility]) CM/SW Contact:  Odilia Bennett, LCSW Phone Number: 08/21/2023, 12:59 PM   Clinical Narrative:   Patient has orders to discharge to [facility] today. RN has already called report. [facility] has been arranged and they estimate that they will be here around 2:30. No further concerns. CSW signing off.  Final next level of care: Skilled Nursing Facility Barriers to Discharge: Barriers Resolved   Patient Goals and CMS Choice            Discharge Placement   Existing PASRR number confirmed : 08/17/23          Patient chooses bed at: Other - please specify in the comment section below: Programme [facility], [facility]) Patient to be transferred to facility by: [facility] Name of family member notified: Avagrace Botelho Patient and family notified of of transfer: 08/21/23  Discharge Plan and Services Additional resources added to the After Visit Summary for       Post Acute Care Choice: Skilled Nursing Facility            DME Agency: NA       HH Arranged: NA          Social Drivers of Health (SDOH) Interventions SDOH Screenings   Food Insecurity: Patient Declined (08/16/2023)  Housing: Patient Declined (08/16/2023)  Transportation Needs: Patient Declined (08/16/2023)  Utilities: Patient Declined (08/16/2023)  Alcohol Screen: Low Risk  (02/17/2021)  Depression (PHQ2-9): Medium Risk (11/05/2021)  Financial Resource Strain: Low Risk  (08/10/2023)   Received from [facility]  Physical Activity: Sufficiently Active (02/17/2021)  Social Connections: Patient Declined (08/16/2023)  Recent Concern: Social Connections - Moderately Isolated (07/18/2023)  Stress: No Stress Concern Present (02/17/2021)  Tobacco Use: Low Risk  (08/16/2023)  Recent Concern: Tobacco Use -  Medium Risk (08/10/2023)   Received from [facility]     Readmission Risk Interventions     No data to display

## 2023-08-21 NOTE — Progress Notes (Signed)
 Subjective: 5 Days Post-Op Procedure(s) (LRB): FIXATION, FEMUR, NECK, PERCUTANEOUS, USING SCREW (Right) Patient reports pain as mild in right hip.   Patient is well, and has had no acute complaints or problems Current plan is for discharge to SNF. Negative for chest pain and shortness of breath Fever: no Gastrointestinal:Negative for nausea and vomiting Reports she has had a BM since surgery.  Objective: Vital signs in last 24 hours: Temp:  [97.8 F (36.6 C)-98.8 F (37.1 C)] 98.8 F (37.1 C) (06/16 0416) Pulse Rate:  [68-73] 68 (06/16 0416) Resp:  [17-18] 18 (06/16 0416) BP: (143-151)/(54-66) 147/60 (06/16 0416) SpO2:  [90 %-94 %] 91 % (06/16 0416)  Intake/Output from previous day:  Intake/Output Summary (Last 24 hours) at 08/21/2023 0745 Last data filed at 08/20/2023 2010 Gross per 24 hour  Intake 240 ml  Output 500 ml  Net -260 ml    Intake/Output this shift: No intake/output data recorded.  Labs: No results for input(s): HGB in the last 72 hours.  No results for input(s): WBC, RBC, HCT, PLT in the last 72 hours.  No results for input(s): NA, K, CL, CO2, BUN, CREATININE, GLUCOSE, CALCIUM  in the last 72 hours.  No results for input(s): LABPT, INR in the last 72 hours.   EXAM General - Patient is Alert, Appropriate, and Oriented Extremity - ABD soft Neurovascular intact Dorsiflexion/Plantar flexion intact Incision: dressing C/D/I No cellulitis present Compartment soft Dressing/Incision - No drainage noted to the right hip honeycomb dressing. Motor Function - intact, moving foot and toes well on exam. Ambulated 28 feet with PT yesterday. Abdomen soft with intact bowel sounds.  Past Medical History:  Diagnosis Date   Acquired thrombophilia (HCC)    Acute metabolic encephalopathy 06/19/2020   Allergy    Atherosclerosis of aorta (HCC)    Atrial fibrillation (HCC)    Bronchiectasis (HCC)    Cancer (HCC)    left breast cancer     Chronic nausea    COPD (chronic obstructive pulmonary disease) (HCC)    GERD (gastroesophageal reflux disease)    Grade II diastolic dysfunction    Hyperlipemia    Hypertension    Intractable nausea and vomiting 06/20/2018   Mild concentric left ventricular hypertrophy (LVH)    Mild mitral stenosis by prior echocardiogram    Mild pulmonary hypertension (HCC)    Psoriatic arthritis (HCC)    Stage 2 moderate COPD by GOLD classification (HCC)     Assessment/Plan: 5 Days Post-Op Procedure(s) (LRB): FIXATION, FEMUR, NECK, PERCUTANEOUS, USING SCREW (Right) Principal Problem:   Hip pain, acute, right Active Problems:   Primary hypertension   Chronic obstructive pulmonary disease (HCC)   Closed fracture of right femur, unspecified fracture morphology, sequela  Estimated body mass index is 21.03 kg/m as calculated from the following:   Height as of this encounter: 5' 2 (1.575 m).   Weight as of this encounter: 52.2 kg. Advance diet Up with therapy D/C IV fluids when tolerating po intake.  Vitals reviewed this AM.  No signs of infection to the right hip. Up with PT.   Patient has had a BM.  Following discharge, follow-up with [facility] in 10-14 days for staple removal and x-rays of the right hip.  DVT Prophylaxis - TED hose and Pradaxa  50% weightbearing to the right leg.  Antoine Bathe, PA-C [facility] 08/21/2023, 7:45 AM

## 2023-08-21 NOTE — TOC Progression Note (Signed)
 Transition of Care [facility]) - Progression Note    Patient Details  Name: [name] MRN: [id number] Date of Birth: [birth year]  [facility]) CM/SW Contact  Odilia Bennett, LCSW Phone Number: 08/21/2023, 12:20 PM  Clinical Narrative:   CSW notified [facility] admissions coordinator that bed offer was accepted. Provided auth details: [id number]. Valid 6/13-6/17. [facility] can accept her today and needs discharge summary by 3:00. MD is aware.  Expected Discharge Plan: Skilled Nursing Facility Barriers to Discharge: Continued Medical Work up  Expected Discharge Plan and Services     Post Acute Care Choice: Skilled Nursing Facility Living arrangements for the past 2 months: Single Family Home                   DME Agency: NA       HH Arranged: NA           Social Determinants of Health (SDOH) Interventions SDOH Screenings   Food Insecurity: Patient Declined (08/16/2023)  Housing: Patient Declined (08/16/2023)  Transportation Needs: Patient Declined (08/16/2023)  Utilities: Patient Declined (08/16/2023)  Alcohol Screen: Low Risk  (02/17/2021)  Depression (PHQ2-9): Medium Risk (11/05/2021)  Financial Resource Strain: Low Risk  (08/10/2023)   Received from [facility]  Physical Activity: Sufficiently Active (02/17/2021)  Social Connections: Patient Declined (08/16/2023)  Recent Concern: Social Connections - Moderately Isolated (07/18/2023)  Stress: No Stress Concern Present (02/17/2021)  Tobacco Use: Low Risk  (08/16/2023)  Recent Concern: Tobacco Use - Medium Risk (08/10/2023)   Received from [facility]    Readmission Risk Interventions     No data to display

## 2023-08-21 NOTE — Progress Notes (Signed)
 Occupational Therapy Treatment Patient Details Name: [name] MRN: [id number] DOB: 05/05/1933 Today's Date: 08/21/2023   History of present illness [name] is a 89 y.o. female  with history of HTN, atrial fibrillation on Pradaxa , COPD and presented to the emergency department for treatment and evaluation of right hip pain after mechanical, nonsyncopal fall.Imaging showed valgus impacted R femoral neck fracture. Patient is s/p in situ cannulated screw fixation of fracture. Partial WB on RLE.   OT comments  Upon entering the room, pt seated in recliner chair with family present in room and agreeable to OT intervention. Pt stands with min A from recliner chair and cues for hand placement on RW. Pt needing mod multimodal cues to properly maintain PWB with mobility tasks. Pt ambulates to bathroom with min A and use of RW for toileting needs. Pt able to perform hygiene while seated and stands with mod A secondary to standing from low surface. PT arrives and pt transitions to PT session without issue.       If plan is discharge home, recommend the following:  A lot of help with walking and/or transfers;A lot of help with bathing/dressing/bathroom;Assistance with cooking/housework;Help with stairs or ramp for entrance;Assist for transportation   Equipment Recommendations  Other (comment) (defer to next venue of care)       Precautions / Restrictions Precautions Precautions: Fall Recall of Precautions/Restrictions: Impaired Restrictions Weight Bearing Restrictions Per Provider Order: Yes RLE Weight Bearing Per Provider Order: Partial weight bearing RLE Partial Weight Bearing Percentage or Pounds: 50%       Mobility Bed Mobility Overal bed mobility: Needs Assistance Bed Mobility: Supine to Sit     Supine to sit: Mod assist          Transfers Overall transfer level: Needs assistance Equipment used: Rolling walker (2 wheels) Transfers: Sit to/from Stand Sit to Stand: Mod  assist     Step pivot transfers: Min assist           Balance Overall balance assessment: Needs assistance Sitting-balance support: No upper extremity supported, Feet supported Sitting balance-Leahy Scale: Good     Standing balance support: Bilateral upper extremity supported, During functional activity, Reliant on assistive device for balance Standing balance-Leahy Scale: Good                             ADL either performed or assessed with clinical judgement   ADL Overall ADL's : Needs assistance/impaired                         Toilet Transfer: Moderate assistance;Regular Toilet;BSC/3in1;Ambulation   Toileting- Clothing Manipulation and Hygiene: Sit to/from stand;Moderate assistance              Extremity/Trunk Assessment Upper Extremity Assessment Upper Extremity Assessment: Generalized weakness   Lower Extremity Assessment Lower Extremity Assessment: Generalized weakness        Vision Baseline Vision/History: 1 Wears glasses Patient Visual Report: No change from baseline     Perception     Praxis     Communication Communication Communication: No apparent difficulties   Cognition Arousal: Alert Behavior During Therapy: WFL for tasks assessed/performed                                 Following commands: Intact        Cueing   Cueing Techniques:  Verbal cues, Tactile cues             Pertinent Vitals/ Pain       Pain Assessment Pain Assessment: No/denies pain         Frequency  Min 2X/week        Progress Toward Goals  OT Goals(current goals can now be found in the care plan section)  Progress towards OT goals: Progressing toward goals      AM-PAC OT 6 Clicks Daily Activity     Outcome Measure   Help from another person eating meals?: None Help from another person taking care of personal grooming?: A Little Help from another person toileting, which includes using toliet, bedpan, or  urinal?: A Little Help from another person bathing (including washing, rinsing, drying)?: A Lot Help from another person to put on and taking off regular upper body clothing?: A Little Help from another person to put on and taking off regular lower body clothing?: A Lot 6 Click Score: 17    End of Session Equipment Utilized During Treatment: Gait belt;Rolling walker (2 wheels);Oxygen   OT Visit Diagnosis: Unsteadiness on feet (R26.81);Muscle weakness (generalized) (M62.81) Pain - Right/Left: Right Pain - part of body: Hip   Activity Tolerance Patient tolerated treatment well   Patient Left with family/visitor present;in bed;with bed alarm set;with call bell/phone within reach   Nurse Communication Mobility status        Time: 1610-9604 OT Time Calculation (min): 10 min  Charges: OT General Charges $OT Visit: 1 Visit OT Treatments $Self Care/Home Management : 8-22 mins   George Kinder, MS, OTR/L , CBIS ascom [phone]  08/21/23, 1:17 PM

## 2023-08-21 NOTE — Progress Notes (Signed)
 Physical Therapy Treatment Patient Details Name: [name] MRN: [id number] DOB: 25-Oct-1933 Today's Date: 08/21/2023   History of Present Illness [name] is a 89 y.o. female  with history of HTN, atrial fibrillation on Pradaxa , COPD and presented to the emergency department for treatment and evaluation of right hip pain after mechanical, nonsyncopal fall.Imaging showed valgus impacted R femoral neck fracture. Patient is s/p in situ cannulated screw fixation of fracture. Partial WB on RLE.    PT Comments  Patient alert, agreeable to PT, up in bathroom with OT. modA to stand from standard commode. Pt ambulated ~36ft in room back to recliner, but noted to have moderate difficulty maintaining PWB status. Needed several instances of Pt led breaks to attempt to improve awareness and BUE support with limited success. Pt unable to ambulate further after a rest break due to fatigue, but participated in seated therex. The patient would benefit from further skilled PT intervention to continue to progress towards goals.    If plan is discharge home, recommend the following: Assist for transportation;Help with stairs or ramp for entrance;A little help with bathing/dressing/bathroom;A little help with walking and/or transfers   Can travel by private vehicle     No  Equipment Recommendations  Other (comment) (TBD at next facility)    Recommendations for Other Services       Precautions / Restrictions Precautions Precautions: Fall Recall of Precautions/Restrictions: Impaired Restrictions Weight Bearing Restrictions Per Provider Order: Yes RLE Weight Bearing Per Provider Order: Partial weight bearing RLE Partial Weight Bearing Percentage or Pounds: 50%     Mobility  Bed Mobility               General bed mobility comments: OOB in chair/in bathroom with OT    Transfers Overall transfer level: Needs assistance Equipment used: Rolling walker (2 wheels) Transfers: Sit to/from  Stand Sit to Stand: Mod assist           General transfer comment: modA from standard commode    Ambulation/Gait Ambulation/Gait assistance: Contact guard assist Gait Distance (Feet): 15 Feet Assistive device: Rolling walker (2 wheels) Gait Pattern/deviations: Step-to pattern, Antalgic, Decreased step length - right, Decreased stance time - right, Decreased step length - left Gait velocity: decreased     General Gait Details: constant cues for PWB, pt challenged by maintaining throughout   Stairs             Wheelchair Mobility     Tilt Bed    Modified Rankin (Stroke Patients Only)       Balance Overall balance assessment: Needs assistance Sitting-balance support: No upper extremity supported, Feet supported Sitting balance-Leahy Scale: Good     Standing balance support: Bilateral upper extremity supported, During functional activity, Reliant on assistive device for balance Standing balance-Leahy Scale: Good                              Communication Communication Communication: No apparent difficulties  Cognition Arousal: Alert Behavior During Therapy: WFL for tasks assessed/performed   PT - Cognitive impairments: No apparent impairments                                Cueing    Exercises Other Exercises Other Exercises: seated calf raises, LAQ, marching x10 ea bilaterally, SAQ x10    General Comments        Pertinent Vitals/Pain Pain  Assessment Pain Assessment: No/denies pain    Home Living                          Prior Function            PT Goals (current goals can now be found in the care plan section) Progress towards PT goals: Progressing toward goals    Frequency    7X/week      PT Plan      Co-evaluation              AM-PAC PT 6 Clicks Mobility   Outcome Measure  Help needed turning from your back to your side while in a flat bed without using bedrails?: A Little Help  needed moving from lying on your back to sitting on the side of a flat bed without using bedrails?: A Little Help needed moving to and from a bed to a chair (including a wheelchair)?: A Little Help needed standing up from a chair using your arms (e.g., wheelchair or bedside chair)?: A Little Help needed to walk in hospital room?: A Little Help needed climbing 3-5 steps with a railing? : A Lot 6 Click Score: 17    End of Session Equipment Utilized During Treatment: Gait belt Activity Tolerance: Patient tolerated treatment well;Patient limited by fatigue Patient left: in chair;with call bell/phone within reach;with chair alarm set;with family/visitor present;with SCD's reapplied Nurse Communication: Mobility status;Precautions;Weight bearing status;Other (comment) (Clean catch taken during session for analysis) PT Visit Diagnosis: Unsteadiness on feet (R26.81);Other abnormalities of gait and mobility (R26.89);History of falling (Z91.81);Muscle weakness (generalized) (M62.81);Difficulty in walking, not elsewhere classified (R26.2)     Time: 8295-6213 PT Time Calculation (min) (ACUTE ONLY): 11 min  Charges:    $Therapeutic Activity: 8-22 mins PT General Charges $$ ACUTE PT VISIT: 1 Visit                     Darien Eden PT, DPT 1:18 PM,08/21/23

## 2023-08-21 NOTE — Progress Notes (Signed)
 Patient discharging all belongings sent with patient, IV removed. Discharge education and teaching provided.

## 2023-08-22 LAB — URINE CULTURE: Culture: 70000 — AB

## 2023-08-28 ENCOUNTER — Other Ambulatory Visit: Payer: Self-pay

## 2023-08-28 ENCOUNTER — Emergency Department
Admission: EM | Admit: 2023-08-28 | Discharge: 2023-08-28 | Disposition: A | Discharge status: Home / Self Care | Attending: Emergency Medicine | Admitting: Emergency Medicine

## 2023-08-28 DIAGNOSIS — Z7901 Long term (current) use of anticoagulants: Secondary | ICD-10-CM | POA: Insufficient documentation

## 2023-08-28 DIAGNOSIS — I509 Heart failure, unspecified: Secondary | ICD-10-CM | POA: Diagnosis not present

## 2023-08-28 DIAGNOSIS — J449 Chronic obstructive pulmonary disease, unspecified: Secondary | ICD-10-CM | POA: Insufficient documentation

## 2023-08-28 DIAGNOSIS — E876 Hypokalemia: Secondary | ICD-10-CM | POA: Insufficient documentation

## 2023-08-28 DIAGNOSIS — R110 Nausea: Secondary | ICD-10-CM | POA: Diagnosis present

## 2023-08-28 LAB — COMPREHENSIVE METABOLIC PANEL WITH GFR
ALT: 15 U/L (ref 0–44)
AST: 19 U/L (ref 15–41)
Albumin: 3.3 g/dL — ABNORMAL LOW (ref 3.5–5.0)
Alkaline Phosphatase: 68 U/L (ref 38–126)
Anion gap: 14 (ref 5–15)
BUN: 17 mg/dL (ref 8–23)
CO2: 27 mmol/L (ref 22–32)
Calcium: 8.8 mg/dL — ABNORMAL LOW (ref 8.9–10.3)
Chloride: 96 mmol/L — ABNORMAL LOW (ref 98–111)
Creatinine, Ser: 0.7 mg/dL (ref 0.44–1.00)
GFR, Estimated: >60 mL/min (ref >60)
Glucose, Bld: 114 mg/dL — ABNORMAL HIGH (ref 70–99)
Potassium: 3.2 mmol/L — ABNORMAL LOW (ref 3.5–5.1)
Sodium: 137 mmol/L (ref 135–145)
Total Bilirubin: 1 mg/dL (ref 0.0–1.2)
Total Protein: 6.7 g/dL (ref 6.5–8.1)

## 2023-08-28 LAB — LACTIC ACID, PLASMA: Lactic Acid, Venous: 1.5 mmol/L (ref 0.5–1.9)

## 2023-08-28 LAB — LIPASE, BLOOD: Lipase: 29 U/L (ref 11–51)

## 2023-08-28 LAB — ACETAMINOPHEN LEVEL: Acetaminophen (Tylenol), Serum: <10 ug/mL — ABNORMAL LOW (ref 10–30)

## 2023-08-28 MED ORDER — METOCLOPRAMIDE HCL 5 MG/ML IJ SOLN
10.0000 mg | Freq: Once | INTRAMUSCULAR | Status: AC
  Administered 2023-08-28: 10 mg via INTRAVENOUS
  Filled 2023-08-28: qty 2

## 2023-08-28 MED ORDER — SODIUM CHLORIDE 0.9 % IV BOLUS
1000.0000 mL | Freq: Once | INTRAVENOUS | Status: AC
  Administered 2023-08-28: 1000 mL via INTRAVENOUS

## 2023-08-28 MED ORDER — METOCLOPRAMIDE HCL 10 MG PO TABS
10.0000 mg | ORAL_TABLET | Freq: Three times a day (TID) | ORAL | 0 refills | Status: DC
Start: 2023-08-28 — End: 2023-09-21

## 2023-08-28 MED ORDER — IPRATROPIUM-ALBUTEROL 0.5-2.5 (3) MG/3ML IN SOLN
3.0000 mL | Freq: Once | RESPIRATORY_TRACT | Status: AC
  Administered 2023-08-28: 3 mL via RESPIRATORY_TRACT
  Filled 2023-08-28: qty 3

## 2023-08-28 NOTE — ED Notes (Signed)
 Pt is moaning, when asked why she stated idk. When asked if she feels SOB, Pt reports yes but unsure when it started, MD notified

## 2023-08-28 NOTE — ED Notes (Signed)
Pt given crackers and water for PO challenge, will reassess shortly

## 2023-08-28 NOTE — ED Provider Notes (Signed)
 [facility] Provider Note   Event Date/Time   First MD Initiated Contact with Patient 08/28/23 0940     (approximate) History  Nausea  HPI [name] is a 89 y.o. female with a past medical history of chronic nausea vomiting, GERD, bronchiectasis, hyperlipidemia, COPD, CHF, pulmonary hypertension, and atrial fibrillation/atrial flutter who presents complaining of nausea/vomiting and p.o. intolerance since a femur surgery last week.  Patient states that she has taken Zofran , meclizine, and omeprazole  without relief.  Patient states she has a follow-up with GI in 2 weeks but has been unable to make it to this appointment. ROS: Patient currently denies any vision changes, tinnitus, difficulty speaking, facial droop, sore throat, chest pain, shortness of breath, abdominal pain, diarrhea, dysuria, or weakness/numbness/paresthesias in any extremity   Physical Exam  Triage Vital Signs: ED Triage Vitals  Encounter Vitals Group     BP 08/28/23 0942 (!) 140/42     Girls Systolic BP Percentile --      Girls Diastolic BP Percentile --      Boys Systolic BP Percentile --      Boys Diastolic BP Percentile --      Pulse Rate 08/28/23 0942 (!) 48     Resp 08/28/23 0942 16     Temp 08/28/23 0943 98 F (36.7 C)     Temp Source 08/28/23 0943 Oral     SpO2 08/28/23 0942 97 %     Weight --      Height --      Head Circumference --      Peak Flow --      Pain Score 08/28/23 0941 0     Pain Loc --      Pain Education --      Exclude from Growth Chart --    Most recent vital signs: Vitals:   08/28/23 1033 08/28/23 1344  BP: (!) 119/54 (!) 151/46  Pulse: (!) 49 76  Resp: 17 16  Temp:  98 F (36.7 C)  SpO2: 96% 96%   General: Awake, oriented x4. CV:  Good peripheral perfusion. Resp:  Normal effort. Abd:  No distention. Other:  Elderly overweight Caucasian female resting comfortably in no acute distress ED Results / Procedures / Treatments  Labs (all labs  ordered are listed, but only abnormal results are displayed) Labs Reviewed  ACETAMINOPHEN  LEVEL - Abnormal; Notable for the following components:      Result Value   Acetaminophen  (Tylenol ), Serum <10 (*)    All other components within normal limits  COMPREHENSIVE METABOLIC PANEL WITH GFR - Abnormal; Notable for the following components:   Potassium 3.2 (*)    Chloride 96 (*)    Glucose, Bld 114 (*)    Calcium  8.8 (*)    Albumin 3.3 (*)    All other components within normal limits  LACTIC ACID, PLASMA  LIPASE, BLOOD   EKG ED ECG REPORT I, Artist MARLA Kerns, the attending physician, personally viewed and interpreted this ECG. Date: 08/28/2023 EKG Time: 0942 Rate: 49 Rhythm: Bradycardic sinus rhythm QRS Axis: normal Intervals: normal ST/T Wave abnormalities: normal Narrative Interpretation: Bradycardic sinus rhythm.  No evidence of acute ischemia PROCEDURES: Critical Care performed: No Procedures MEDICATIONS ORDERED IN ED: Medications  sodium chloride  0.9 % bolus 1,000 mL (0 mLs Intravenous Stopped 08/28/23 1251)  metoCLOPramide  (REGLAN ) injection 10 mg (10 mg Intravenous Given 08/28/23 1141)  ipratropium-albuterol  (DUONEB) 0.5-2.5 (3) MG/3ML nebulizer solution 3 mL (3 mLs Nebulization Given 08/28/23 1033)  IMPRESSION / MDM / ASSESSMENT AND PLAN / ED COURSE  I reviewed the triage vital signs and the nursing notes.                             The patient is on the cardiac monitor to evaluate for evidence of arrhythmia and/or significant heart rate changes. Patient's presentation is most consistent with acute presentation with potential threat to life or bodily function. Patient presents for acute nausea/vomiting The cause of the patient's symptoms is not clear, but the patient is overall well appearing and is suspected to have a transient course of illness.  Given History and Exam there does not appear to be an emergent cause of the symptoms such as small bowel obstruction,  coronary syndrome, bowel ischemia, DKA, pancreatitis, appendicitis, other acute abdomen or other emergent problem.  Reassessment: After treatment, the patient is feeling much better, tolerating PO fluids, and shows no signs of dehydration.   Disposition: Discharge home with prompt primary care physician follow up in the next 48 hours. Strict return precautions discussed.   FINAL CLINICAL IMPRESSION(S) / ED DIAGNOSES   Final diagnoses:  Nausea   Rx / DC Orders   ED Discharge Orders          Ordered    metoCLOPramide  (REGLAN ) 10 MG tablet  3 times daily before meals & bedtime        08/28/23 1418           Note:  This document was prepared using Dragon voice recognition software and may include unintentional dictation errors.   Crit Obremski K, MD 08/28/23 [phone]

## 2023-08-28 NOTE — ED Notes (Signed)
 Pt stuck x3 times without success, IV team consulted

## 2023-08-28 NOTE — ED Triage Notes (Signed)
 BIBEMS, coming from [facility]. Pt had surgery on femur fx last week. Pt reports after surgery she has had chronic dry heaves. Pt has been able to eat food though appetite has decreased. GI f/u in 2 weeks, unable to wait until appt. Tried zofran , meclizine, omeprazole  without relief. PMH: GERD, a-fib. GCS 15. HR 47-50, VSS. BGL: 108

## 2023-09-12 ENCOUNTER — Inpatient Hospital Stay
Admission: EM | Admit: 2023-09-12 | Discharge: 2023-09-21 | DRG: 229 | Disposition: A | Discharge status: Skilled Nursing Facility | Attending: Internal Medicine | Admitting: Internal Medicine

## 2023-09-12 ENCOUNTER — Emergency Department

## 2023-09-12 ENCOUNTER — Other Ambulatory Visit: Payer: Self-pay

## 2023-09-12 DIAGNOSIS — Z7901 Long term (current) use of anticoagulants: Secondary | ICD-10-CM

## 2023-09-12 DIAGNOSIS — J449 Chronic obstructive pulmonary disease, unspecified: Secondary | ICD-10-CM | POA: Diagnosis present

## 2023-09-12 DIAGNOSIS — L400 Psoriasis vulgaris: Secondary | ICD-10-CM | POA: Diagnosis present

## 2023-09-12 DIAGNOSIS — I5030 Unspecified diastolic (congestive) heart failure: Secondary | ICD-10-CM | POA: Diagnosis present

## 2023-09-12 DIAGNOSIS — Z90711 Acquired absence of uterus with remaining cervical stump: Secondary | ICD-10-CM

## 2023-09-12 DIAGNOSIS — I442 Atrioventricular block, complete: Principal | ICD-10-CM | POA: Diagnosis present

## 2023-09-12 DIAGNOSIS — I2489 Other forms of acute ischemic heart disease: Secondary | ICD-10-CM | POA: Diagnosis present

## 2023-09-12 DIAGNOSIS — I480 Paroxysmal atrial fibrillation: Secondary | ICD-10-CM | POA: Diagnosis present

## 2023-09-12 DIAGNOSIS — Z853 Personal history of malignant neoplasm of breast: Secondary | ICD-10-CM

## 2023-09-12 DIAGNOSIS — Z803 Family history of malignant neoplasm of breast: Secondary | ICD-10-CM | POA: Diagnosis not present

## 2023-09-12 DIAGNOSIS — Z006 Encounter for examination for normal comparison and control in clinical research program: Secondary | ICD-10-CM | POA: Diagnosis not present

## 2023-09-12 DIAGNOSIS — Z66 Do not resuscitate: Secondary | ICD-10-CM | POA: Diagnosis present

## 2023-09-12 DIAGNOSIS — Z79899 Other long term (current) drug therapy: Secondary | ICD-10-CM

## 2023-09-12 DIAGNOSIS — I5A Non-ischemic myocardial injury (non-traumatic): Secondary | ICD-10-CM | POA: Diagnosis present

## 2023-09-12 DIAGNOSIS — J4489 Other specified chronic obstructive pulmonary disease: Secondary | ICD-10-CM | POA: Diagnosis present

## 2023-09-12 DIAGNOSIS — E785 Hyperlipidemia, unspecified: Secondary | ICD-10-CM | POA: Diagnosis present

## 2023-09-12 DIAGNOSIS — E876 Hypokalemia: Secondary | ICD-10-CM | POA: Diagnosis present

## 2023-09-12 DIAGNOSIS — L4050 Arthropathic psoriasis, unspecified: Secondary | ICD-10-CM | POA: Diagnosis present

## 2023-09-12 DIAGNOSIS — R112 Nausea with vomiting, unspecified: Secondary | ICD-10-CM

## 2023-09-12 DIAGNOSIS — I272 Pulmonary hypertension, unspecified: Secondary | ICD-10-CM | POA: Diagnosis present

## 2023-09-12 DIAGNOSIS — J8410 Pulmonary fibrosis, unspecified: Secondary | ICD-10-CM | POA: Diagnosis present

## 2023-09-12 DIAGNOSIS — K219 Gastro-esophageal reflux disease without esophagitis: Secondary | ICD-10-CM | POA: Diagnosis present

## 2023-09-12 DIAGNOSIS — I10 Essential (primary) hypertension: Secondary | ICD-10-CM | POA: Diagnosis present

## 2023-09-12 DIAGNOSIS — I110 Hypertensive heart disease with heart failure: Secondary | ICD-10-CM | POA: Diagnosis present

## 2023-09-12 DIAGNOSIS — Z880 Allergy status to penicillin: Secondary | ICD-10-CM | POA: Diagnosis not present

## 2023-09-12 DIAGNOSIS — R001 Bradycardia, unspecified: Secondary | ICD-10-CM | POA: Diagnosis not present

## 2023-09-12 DIAGNOSIS — I5032 Chronic diastolic (congestive) heart failure: Secondary | ICD-10-CM | POA: Diagnosis present

## 2023-09-12 DIAGNOSIS — Z888 Allergy status to other drugs, medicaments and biological substances status: Secondary | ICD-10-CM | POA: Diagnosis not present

## 2023-09-12 DIAGNOSIS — D6859 Other primary thrombophilia: Secondary | ICD-10-CM | POA: Diagnosis present

## 2023-09-12 DIAGNOSIS — I493 Ventricular premature depolarization: Secondary | ICD-10-CM | POA: Diagnosis present

## 2023-09-12 DIAGNOSIS — R531 Weakness: Secondary | ICD-10-CM | POA: Diagnosis not present

## 2023-09-12 DIAGNOSIS — Z7951 Long term (current) use of inhaled steroids: Secondary | ICD-10-CM

## 2023-09-12 DIAGNOSIS — I482 Chronic atrial fibrillation, unspecified: Secondary | ICD-10-CM | POA: Diagnosis present

## 2023-09-12 DIAGNOSIS — Z6820 Body mass index (BMI) 20.0-20.9, adult: Secondary | ICD-10-CM

## 2023-09-12 DIAGNOSIS — Z515 Encounter for palliative care: Secondary | ICD-10-CM | POA: Diagnosis not present

## 2023-09-12 DIAGNOSIS — R638 Other symptoms and signs concerning food and fluid intake: Secondary | ICD-10-CM | POA: Diagnosis not present

## 2023-09-12 DIAGNOSIS — E440 Moderate protein-calorie malnutrition: Secondary | ICD-10-CM | POA: Diagnosis present

## 2023-09-12 DIAGNOSIS — D72829 Elevated white blood cell count, unspecified: Secondary | ICD-10-CM | POA: Diagnosis not present

## 2023-09-12 DIAGNOSIS — F03A0 Unspecified dementia, mild, without behavioral disturbance, psychotic disturbance, mood disturbance, and anxiety: Secondary | ICD-10-CM | POA: Diagnosis present

## 2023-09-12 DIAGNOSIS — J479 Bronchiectasis, uncomplicated: Secondary | ICD-10-CM

## 2023-09-12 DIAGNOSIS — R630 Anorexia: Secondary | ICD-10-CM

## 2023-09-12 LAB — CBC WITH DIFFERENTIAL/PLATELET
Abs Immature Granulocytes: 0.28 K/uL — ABNORMAL HIGH (ref 0.00–0.07)
Basophils Absolute: 0.1 K/uL (ref 0.0–0.1)
Basophils Relative: 0 %
Eosinophils Absolute: 0 K/uL (ref 0.0–0.5)
Eosinophils Relative: 0 %
HCT: 41.7 % (ref 36.0–46.0)
Hemoglobin: 14.1 g/dL (ref 12.0–15.0)
Immature Granulocytes: 1 %
Lymphocytes Relative: 4 %
Lymphs Abs: 0.8 K/uL (ref 0.7–4.0)
MCH: 29.7 pg (ref 26.0–34.0)
MCHC: 33.8 g/dL (ref 30.0–36.0)
MCV: 87.8 fL (ref 80.0–100.0)
Monocytes Absolute: 2.1 K/uL — ABNORMAL HIGH (ref 0.1–1.0)
Monocytes Relative: 9 %
Neutro Abs: 19.9 K/uL — ABNORMAL HIGH (ref 1.7–7.7)
Neutrophils Relative %: 86 %
Platelets: 273 K/uL (ref 150–400)
RBC: 4.75 MIL/uL (ref 3.87–5.11)
RDW: 15.2 % (ref 11.5–15.5)
WBC: 23.2 K/uL — ABNORMAL HIGH (ref 4.0–10.5)
nRBC: 0 % (ref 0.0–0.2)

## 2023-09-12 LAB — BRAIN NATRIURETIC PEPTIDE: B Natriuretic Peptide: 776.7 pg/mL — ABNORMAL HIGH (ref 0.0–100.0)

## 2023-09-12 LAB — COMPREHENSIVE METABOLIC PANEL WITH GFR
ALT: 12 U/L (ref 0–44)
AST: 20 U/L (ref 15–41)
Albumin: 3.3 g/dL — ABNORMAL LOW (ref 3.5–5.0)
Alkaline Phosphatase: 87 U/L (ref 38–126)
Anion gap: 13 (ref 5–15)
BUN: 22 mg/dL (ref 8–23)
CO2: 24 mmol/L (ref 22–32)
Calcium: 8.9 mg/dL (ref 8.9–10.3)
Chloride: 98 mmol/L (ref 98–111)
Creatinine, Ser: 0.64 mg/dL (ref 0.44–1.00)
GFR, Estimated: >60 mL/min (ref >60)
Glucose, Bld: 120 mg/dL — ABNORMAL HIGH (ref 70–99)
Potassium: 3.5 mmol/L (ref 3.5–5.1)
Sodium: 135 mmol/L (ref 135–145)
Total Bilirubin: 1 mg/dL (ref 0.0–1.2)
Total Protein: 7.2 g/dL (ref 6.5–8.1)

## 2023-09-12 LAB — TROPONIN I (HIGH SENSITIVITY)
Troponin I (High Sensitivity): 21 ng/L — ABNORMAL HIGH (ref <18)
Troponin I (High Sensitivity): 20 ng/L — ABNORMAL HIGH
Troponin I (High Sensitivity): 16 ng/L (ref <18)

## 2023-09-12 LAB — MAGNESIUM: Magnesium: 1.9 mg/dL (ref 1.7–2.4)

## 2023-09-12 LAB — PROTIME-INR
INR: 1.3 — ABNORMAL HIGH (ref 0.8–1.2)
Prothrombin Time: 17 s — ABNORMAL HIGH (ref 11.4–15.2)

## 2023-09-12 LAB — APTT: aPTT: 51 s — ABNORMAL HIGH (ref 24–36)

## 2023-09-12 MED ORDER — DM-GUAIFENESIN ER 30-600 MG PO TB12: 1.0000 | ORAL_TABLET | Freq: Two times a day (BID) | ORAL | Status: DC | PRN

## 2023-09-12 MED ORDER — DOCUSATE SODIUM 100 MG PO CAPS
100.0000 mg | ORAL_CAPSULE | Freq: Two times a day (BID) | ORAL | Status: DC | PRN
  Administered 2023-09-17: 100 mg via ORAL
  Filled 2023-09-12: qty 1

## 2023-09-12 MED ORDER — ALBUTEROL SULFATE (2.5 MG/3ML) 0.083% IN NEBU: 2.5000 mg | INHALATION_SOLUTION | RESPIRATORY_TRACT | Status: DC | PRN

## 2023-09-12 MED ORDER — PANTOPRAZOLE SODIUM 40 MG PO TBEC
40.0000 mg | DELAYED_RELEASE_TABLET | Freq: Every day | ORAL | Status: DC
  Filled 2023-09-12: qty 1

## 2023-09-12 MED ORDER — FOLIC ACID 1 MG PO TABS
1.0000 mg | ORAL_TABLET | Freq: Every day | ORAL | Status: DC
  Administered 2023-09-14 – 2023-09-21 (×8): 1 mg via ORAL
  Filled 2023-09-12 (×8): qty 1

## 2023-09-12 MED ORDER — HYDROCODONE-ACETAMINOPHEN 5-325 MG PO TABS: 1.0000 | ORAL_TABLET | ORAL | Status: DC | PRN

## 2023-09-12 MED ORDER — HYDROCHLOROTHIAZIDE 25 MG PO TABS
25.0000 mg | ORAL_TABLET | Freq: Every day | ORAL | Status: DC
  Filled 2023-09-12: qty 1

## 2023-09-12 MED ORDER — HEPARIN (PORCINE) 25000 UT/250ML-% IV SOLN
950.0000 [IU]/h | INTRAVENOUS | Status: DC
  Administered 2023-09-12: 800 [IU]/h via INTRAVENOUS
  Administered 2023-09-13 – 2023-09-15 (×2): 950 [IU]/h via INTRAVENOUS
  Filled 2023-09-12 (×5): qty 250

## 2023-09-12 MED ORDER — SODIUM CHLORIDE 0.9 % IV BOLUS
1000.0000 mL | Freq: Once | INTRAVENOUS | Status: AC
  Administered 2023-09-12: 1000 mL via INTRAVENOUS

## 2023-09-12 MED ORDER — ATROPINE SULFATE 1 MG/10ML IJ SOSY: 1.0000 mg | PREFILLED_SYRINGE | INTRAMUSCULAR | Status: DC | PRN

## 2023-09-12 MED ORDER — LORAZEPAM 0.5 MG PO TABS
0.5000 mg | ORAL_TABLET | Freq: Every day | ORAL | Status: DC
  Administered 2023-09-13 – 2023-09-20 (×5): 0.5 mg via ORAL
  Filled 2023-09-12 (×6): qty 1

## 2023-09-12 MED ORDER — HYDRALAZINE HCL 20 MG/ML IJ SOLN
5.0000 mg | INTRAMUSCULAR | Status: DC | PRN
  Administered 2023-09-15 – 2023-09-20 (×4): 5 mg via INTRAVENOUS
  Filled 2023-09-12 (×4): qty 1

## 2023-09-12 MED ORDER — BUDESON-GLYCOPYRROL-FORMOTEROL 160-9-4.8 MCG/ACT IN AERO
2.0000 | INHALATION_SPRAY | Freq: Two times a day (BID) | RESPIRATORY_TRACT | Status: DC
  Administered 2023-09-13 – 2023-09-21 (×14): 2 via RESPIRATORY_TRACT
  Filled 2023-09-12: qty 5.9

## 2023-09-12 MED ORDER — ACETAMINOPHEN 325 MG PO TABS
650.0000 mg | ORAL_TABLET | Freq: Four times a day (QID) | ORAL | Status: DC | PRN
  Administered 2023-09-20: 650 mg via ORAL
  Filled 2023-09-12: qty 2

## 2023-09-12 MED ORDER — DIPHENHYDRAMINE HCL 50 MG/ML IJ SOLN: 12.5000 mg | Freq: Three times a day (TID) | INTRAMUSCULAR | Status: DC | PRN

## 2023-09-12 NOTE — ED Provider Notes (Signed)
 [facility] Provider Note    Event Date/Time   First MD Initiated Contact with Patient 09/12/23 1800     (approximate)   History   Weakness   HPI Sharleen Szczesny Bartosik is a 89 y.o. female with history of HLD, HFpEF, A-fib on Pradaxa  presenting today for weakness.  Patient reports having hip surgery approximately 1 month ago.  She had been doing well and ambulating since then.  In the past 7 days they have noticed worsening weakness to the point in which during the middle of the day today, she was no longer able to ambulate with assistance with a walker.  She describes weakness but otherwise denies chest pain, nausea, vomiting, shortness of breath, abdominal pain, cough, congestion, dysuria.  They report her heart rate has been in the 40s when checking.  Chart review: Reviewed recent admission for hip fracture     Physical Exam   Triage Vital Signs: ED Triage Vitals  Encounter Vitals Group     BP 09/12/23 1745 (!) 158/52     Girls Systolic BP Percentile --      Girls Diastolic BP Percentile --      Boys Systolic BP Percentile --      Boys Diastolic BP Percentile --      Pulse Rate 09/12/23 1745 (!) 49     Resp 09/12/23 1745 15     Temp 09/12/23 1745 98.8 F (37.1 C)     Temp Source 09/12/23 1745 Oral     SpO2 09/12/23 1745 91 %     Weight --      Height 09/12/23 1742 5' 2 (1.575 m)     Head Circumference --      Peak Flow --      Pain Score 09/12/23 1742 0     Pain Loc --      Pain Education --      Exclude from Growth Chart --     Most recent vital signs: Vitals:   09/12/23 1745  BP: (!) 158/52  Pulse: (!) 49  Resp: 15  Temp: 98.8 F (37.1 C)  SpO2: 91%   I have reviewed the vital signs. General:  Awake, alert,  Fatigued appearing Head:  Normocephalic, Atraumatic. EENT:  PERRL, EOMI, Oral mucosa pink and moist, Neck is supple. Cardiovascular: Bradycardic rate, 2+ distal pulses. Respiratory:  Normal respiratory effort, symmetrical  expansion, no distress.   Extremities:  Moving all four extremities through full ROM without pain.   Neuro:  Alert and oriented.  Interacting appropriately.   Skin:  Warm, dry, no rash.   Psych: Appropriate affect.    ED Results / Procedures / Treatments   Labs (all labs ordered are listed, but only abnormal results are displayed) Labs Reviewed  CBC WITH DIFFERENTIAL/PLATELET - Abnormal; Notable for the following components:      Result Value   WBC 23.2 (*)    Neutro Abs 19.9 (*)    Monocytes Absolute 2.1 (*)    Abs Immature Granulocytes 0.28 (*)    All other components within normal limits  COMPREHENSIVE METABOLIC PANEL WITH GFR - Abnormal; Notable for the following components:   Glucose, Bld 120 (*)    Albumin 3.3 (*)    All other components within normal limits  TROPONIN I (HIGH SENSITIVITY) - Abnormal; Notable for the following components:   Troponin I (High Sensitivity) 21 (*)    All other components within normal limits  URINALYSIS, ROUTINE W REFLEX MICROSCOPIC  MAGNESIUM   EKG My EKG interpretation: 2 EKGs showing heart rates in the 40s to 50s.  Both appear to show complete third-degree heart block.  No obvious ST elevations or depressions.   RADIOLOGY    PROCEDURES:  Critical Care performed: Yes, see critical care procedure note(s)  .Critical Care  Performed by: Malvina Alm DASEN, MD Authorized by: Malvina Alm DASEN, MD   Critical care provider statement:    Critical care time (minutes):  35   Critical care was necessary to treat or prevent imminent or life-threatening deterioration of the following conditions: Complete heart block.   Critical care was time spent personally by me on the following activities:  Development of treatment plan with patient or surrogate, discussions with consultants, evaluation of patient's response to treatment, examination of patient, ordering and review of laboratory studies, ordering and review of radiographic studies, ordering and  performing treatments and interventions, pulse oximetry, re-evaluation of patient's condition and review of old charts   I assumed direction of critical care for this patient from another provider in my specialty: no     Care discussed with: admitting provider      MEDICATIONS ORDERED IN ED: Medications  sodium chloride  0.9 % bolus 1,000 mL (has no administration in time range)     IMPRESSION / MDM / ASSESSMENT AND PLAN / ED COURSE  I reviewed the triage vital signs and the nursing notes.                              Differential diagnosis includes, but is not limited to, complete heart block, electrolyte abnormality, dehydration, AKI  Patient's presentation is most consistent with acute presentation with potential threat to life or bodily function.  Patient is an 89 year old female presenting today for weakness.  On arrival, patient is bradycardic in the upper 40s but blood pressure is stable.  Physical exam unremarkable outside of bradycardia with no unilateral weakness or numbness anywhere.  EKG which was performed twice shows evidence of complete heart block with patient having no history of the same.  Laboratory workup with CBC shows leukocytosis although similar to her recent levels.  No obvious infectious symptoms that I can find but will get chest x-ray as well.  CMP unremarkable.  Troponin at 21 with no chest pain.  Consulted cardiology, Dr. Florencio.  Thinks this could potentially be related to her combination of amiodarone  and metoprolol .  Recommend stopping these and holding them at this time as well as giving her fluids.  She is otherwise hemodynamically stable but was placed on cardiac pads at the bedside as a precaution.  Will admit to hospitalist for ongoing monitoring.  No emergent indication for pacemaker per cardiology at this time given she is hemodynamically stable.  The patient is on the cardiac monitor to evaluate for evidence of arrhythmia and/or significant heart rate  changes. Clinical Course as of 09/12/23 1836  Tue Sep 12, 2023  1819 Dr. Florencio - cardiology - stop amiodaraone, give fluids, stop metoprolol . Will monitor her response to that before any consideration of pacemaker while still hemodynamically stable [DW]    Clinical Course User Index [DW] Malvina Alm DASEN, MD     FINAL CLINICAL IMPRESSION(S) / ED DIAGNOSES   Final diagnoses:  Complete heart block (HCC)  Weakness     Rx / DC Orders   ED Discharge Orders     None        Note:  This document was  prepared using Conservation officer, historic buildings and may include unintentional dictation errors.   Malvina Alm DASEN, MD 09/12/23 [phone]

## 2023-09-12 NOTE — Consult Note (Signed)
 PHARMACY - ANTICOAGULATION CONSULT NOTE  Pharmacy Consult for Heparin   Indication: atrial fibrillation  Allergies  Allergen Reactions   Amoxicillin  Nausea Only   Eliquis  [Apixaban ] Nausea Only    Patient Measurements: Height: 5' 2 (157.5 cm) IBW/kg (Calculated) : 50.1 Heparin  weight 58.9 kg   Vital Signs: Temp: 98.8 F (37.1 C) (07/08 1745) Temp Source: Oral (07/08 1745) BP: 162/46 (07/08 1913) Pulse Rate: 48 (07/08 1913)  Labs: Recent Labs    09/12/23 1745  HGB 14.1  HCT 41.7  PLT 273  CREATININE 0.64  TROPONINIHS 21*    CrCl cannot be calculated (Unknown ideal weight.).   Medical History: Past Medical History:  Diagnosis Date   Acquired thrombophilia (HCC)    Acute metabolic encephalopathy 06/19/2020   Allergy    Atherosclerosis of aorta (HCC)    Atrial fibrillation (HCC)    Bronchiectasis (HCC)    Cancer (HCC)    left breast cancer    Chronic nausea    COPD (chronic obstructive pulmonary disease) (HCC)    GERD (gastroesophageal reflux disease)    Grade II diastolic dysfunction    Hyperlipemia    Hypertension    Intractable nausea and vomiting 06/20/2018   Mild concentric left ventricular hypertrophy (LVH)    Mild mitral stenosis by prior echocardiogram    Mild pulmonary hypertension (HCC)    Psoriatic arthritis (HCC)    Stage 2 moderate COPD by GOLD classification (HCC)     Medications:  (Not in a hospital admission)  Scheduled:  Infusions:  PRN: acetaminophen , albuterol , atropine , dextromethorphan-guaiFENesin , diphenhydrAMINE , hydrALAZINE  Anti-infectives (From admission, onward)    None       Assessment: 89 y.o. female with history of HLD, HFpEF, A-fib on Pradaxa  presenting today for weakness. She had a hip surgery about 1 month ago putting her at an increase risk for clots. Patient has complete heart block now. CHADSVASc 5.  Pharmacy consulted to start heparin  for afib. CBC stable.   Goal of Therapy:  Heparin  level 0.3-0.7  units/ml Monitor platelets by anticoagulation protocol: Yes   Plan:  Start heparin  infusion at 800 units/hr Check anti-Xa level in 8 hours and daily while on heparin  Continue to monitor H&H and platelets  Cathaleen GORMAN Blanch, PharmD, BCPS 09/12/2023,7:30 PM

## 2023-09-12 NOTE — H&P (Addendum)
 History and Physical    [name] FMW:[id number] DOB: 07-Jun-1933 DOA: 09/12/2023  Referring MD/NP/PA:   PCP: Cleotilde Oneil FALCON, MD   Patient coming from:  The patient is coming from home.     Chief Complaint: Weakness  HPI: [name] is a 89 y.o. female with medical history significant of HTN, HLD, HFpEF, A-fib on Pradaxa , COPD, bronchiectasis, GERD, breast cancer s/p of a left lumpectomy), Psoriatec arthritis, mild pulmonary hypertension, recent right hip fracture, who presents with weakness.  Patient was recently hospitalized from 6/11 - 6/16 due to mechanical fall and right hip fracture.  Patient was s/p of right hip surgery.  Patient states that her surgical site has been healing well.  In the past several days she has been feeling generalized weak, with poor appetite and decreased oral intake, cannot ambulate without assistance due to weakness.  No fall.  She has nausea, no vomiting, diarrhea or abdominal pain.  No chest pain, cough, SOB.  Denies symptoms of UTI.  No rectal bleeding or dark stool.  Per her husband, patient's heart rate was at 40s when he checked it for her at home.  Pt was found to have new complete heart block on EKG with heart rate in 40s in ED. Pt is taking amiodarone  200 mg daily and metoprolol  37.5 mg twice daily for A-fib currently.  Data reviewed independently and ED Course: pt was found to have WBC 23.2, potassium 3.5, Mg 1.9, GFR> 60, troponin 21.  Temperature normal, blood pressure 158/52, heart rate of 49, RR 15, oxygen  saturation 91% on room air.  Patient is admitted to stepdown as inpatient.  Dr. Florencio of cardiology is consulted.  CXR:  Persistent but improving left basilar atelectasis or infiltrate. No visible effusions.    EKG: I have personally reviewed.  Complete heart block, QTc 552, poor R wave progression   Review of Systems:   General: no fevers, chills, no body weight gain, has poor appetite, has fatigue HEENT: no blurry vision,  hearing changes or sore throat Respiratory: no dyspnea, coughing, wheezing CV: no chest pain, no palpitations GI: has nausea, no vomiting, abdominal pain, diarrhea, constipation GU: no dysuria, burning on urination, increased urinary frequency, hematuria  Ext: no leg edema Neuro: no unilateral weakness, numbness, or tingling, no vision change or hearing loss Skin: no rash, no skin tear. MSK: No muscle spasm, no deformity, no limitation of range of movement in spin Heme: No easy bruising.  Travel history: No recent long distant travel.   Allergy:  Allergies  Allergen Reactions   Amoxicillin  Nausea Only   Eliquis  [Apixaban ] Nausea Only    Past Medical History:  Diagnosis Date   Acquired thrombophilia (HCC)    Acute metabolic encephalopathy 06/19/2020   Allergy    Atherosclerosis of aorta (HCC)    Atrial fibrillation (HCC)    Bronchiectasis (HCC)    Cancer (HCC)    left breast cancer    Chronic nausea    COPD (chronic obstructive pulmonary disease) (HCC)    GERD (gastroesophageal reflux disease)    Grade II diastolic dysfunction    Hyperlipemia    Hypertension    Intractable nausea and vomiting 06/20/2018   Mild concentric left ventricular hypertrophy (LVH)    Mild mitral stenosis by prior echocardiogram    Mild pulmonary hypertension (HCC)    Psoriatic arthritis (HCC)    Stage 2 moderate COPD by GOLD classification [facility])     Past Surgical History:  Procedure Laterality Date  BREAST LUMPECTOMY Left 2008   CATARACT EXTRACTION W/PHACO Left 02/13/2023   Procedure: CATARACT EXTRACTION PHACO AND INTRAOCULAR LENS PLACEMENT (IOC) LEFT  KDB GONIOTOMY;  Surgeon: Enola Feliciano Hugger, MD;  Location: [facility];  Service: Ophthalmology;  Laterality: Left;  10.53 0:55.   CATARACT EXTRACTION W/PHACO Right 02/27/2023   Procedure: CATARACT EXTRACTION PHACO AND INTRAOCULAR LENS PLACEMENT (IOC) RIGHT  [place], goniotomy 15.90 01:28.6;  Surgeon: Enola Feliciano Hugger, MD;   Location: [facility];  Service: Ophthalmology;  Laterality: Right;   CHOLECYSTECTOMY     HIP PINNING,CANNULATED Left 06/05/2020   Procedure: CANNULATED HIP PINNING;  Surgeon: Kathlynn Sharper, MD;  Location: [facility] ORS;  Service: Orthopedics;  Laterality: Left;   HIP PINNING,CANNULATED Right 08/16/2023   Procedure: FIXATION, FEMUR, NECK, PERCUTANEOUS, USING SCREW;  Surgeon: Edie Norleen PARAS, MD;  Location: [facility] ORS;  Service: Orthopedics;  Laterality: Right;   PARTIAL HYSTERECTOMY     TEE WITHOUT CARDIOVERSION N/A 06/10/2020   Procedure: TRANSESOPHAGEAL ECHOCARDIOGRAM (TEE);  Surgeon: Darliss Rogue, MD;  Location: [facility] ORS;  Service: Cardiovascular;  Laterality: N/A;    Social History:  reports that she has never smoked. She has never used smokeless tobacco. She reports that she does not drink alcohol and does not use drugs.  Family History:  Family History  Problem Relation Age of Onset   Breast cancer Sister      Prior to Admission medications   Medication Sig Start Date End Date Taking? Authorizing Provider  amiodarone  (PACERONE ) 200 MG tablet Take 1 tablet (200 mg total) by mouth daily. 08/22/23   Patel, Sona, MD  dabigatran  (PRADAXA ) 150 MG CAPS capsule Take 1 capsule (150 mg total) by mouth every 12 (twelve) hours. 07/21/23   Laurita Pillion, MD  docusate sodium  (COLACE) 100 MG capsule Take 1 capsule (100 mg total) by mouth 2 (two) times daily. 08/21/23   Patel, Sona, MD  Fluticasone -Umeclidin-Vilant (TRELEGY ELLIPTA) 100-62.5-25 MCG/ACT AEPB Inhale 1 puff into the lungs daily.    [provider]  folic acid  (FOLVITE ) 1 MG tablet Take 1 mg by mouth daily. Pt takes this med the days she does not take methotrexate  07/13/21   [provider]  furosemide  (LASIX ) 40 MG tablet Take 1 tablet (40 mg total) by mouth daily. 07/22/23   Laurita Pillion, MD  hydrochlorothiazide  (HYDRODIURIL ) 25 MG tablet Take 1 tablet by mouth daily. 08/10/23 08/09/24  [provider]   HYDROcodone -acetaminophen  (NORCO/VICODIN) 5-325 MG tablet Take 1-2 tablets by mouth every 4 (four) hours as needed for moderate pain (pain score 4-6). 08/19/23   Verlinda Boas, PA-C  LORazepam  (ATIVAN ) 0.5 MG tablet Take 1 tablet (0.5 mg total) by mouth at bedtime. 08/21/23   Patel, Sona, MD  methotrexate  (RHEUMATREX) 2.5 MG tablet Take 5 mg by mouth once a week. Caution:Chemotherapy. Protect from light. Pt take 2 weekly.    [provider]  metoCLOPramide  (REGLAN ) 10 MG tablet Take 1 tablet (10 mg total) by mouth 4 (four) times daily -  before meals and at bedtime. 08/28/23 09/27/23  Bradler, Evan K, MD  metoprolol  tartrate (LOPRESSOR ) 25 MG tablet Take 1.5 tablets (37.5 mg total) by mouth 2 (two) times daily. 07/21/23   Laurita Pillion, MD  omeprazole  (PRILOSEC) 40 MG capsule Take 40 mg by mouth daily.    [provider]  ondansetron  (ZOFRAN ) 4 MG tablet Take 4 mg by mouth every 8 (eight) hours as needed for nausea or vomiting.    [provider]    Physical Exam:  Vitals:   09/12/23 1742 09/12/23 1745 09/12/23 1913 09/12/23 1936  BP:  (!) 158/52 (!) 162/46   Pulse:  (!) 49 (!) 48   Resp:  15 (!) 23   Temp:  98.8 F (37.1 C)    TempSrc:  Oral    SpO2:  91% 96%   Weight:    58.9 kg  Height: 5' 2 (1.575 m)   5' 2 (1.575 m)   General: Not in acute distress HEENT:       Eyes: PERRL, EOMI, no jaundice       ENT: No discharge from the ears and nose, no pharynx injection, no tonsillar enlargement.        Neck: No JVD, no bruit, no mass felt. Heme: No neck lymph node enlargement. Cardiac: S1/S2, RRR, No gallops or rubs. Respiratory: No rales, wheezing, rhonchi or rubs. GI: Soft, nondistended, nontender, no rebound pain, no organomegaly, BS present. GU: No hematuria Ext: No pitting leg edema bilaterally. 1+DP/PT pulse bilaterally. Musculoskeletal: No joint deformities, No joint redness or warmth, no limitation of ROM in spin. Skin: No rashes.  Neuro: Alert, oriented  X3, cranial nerves II-XII grossly intact, moves all extremities. Psych: Patient is not psychotic, no suicidal or hemocidal ideation.  Labs on Admission: I have personally reviewed following labs and imaging studies  CBC: Recent Labs  Lab 09/12/23 1745  WBC 23.2*  NEUTROABS 19.9*  HGB 14.1  HCT 41.7  MCV 87.8  PLT 273   Basic Metabolic Panel: Recent Labs  Lab 09/12/23 1745  NA 135  K 3.5  CL 98  CO2 24  GLUCOSE 120*  BUN 22  CREATININE 0.64  CALCIUM  8.9  MG 1.9   GFR: Estimated Creatinine Clearance: 39.2 mL/min (by C-G formula based on SCr of 0.64 mg/dL). Liver Function Tests: Recent Labs  Lab 09/12/23 1745  AST 20  ALT 12  ALKPHOS 87  BILITOT 1.0  PROT 7.2  ALBUMIN 3.3*   No results for input(s): LIPASE, AMYLASE in the last 168 hours. No results for input(s): AMMONIA in the last 168 hours. Coagulation Profile: No results for input(s): INR, PROTIME in the last 168 hours. Cardiac Enzymes: No results for input(s): CKTOTAL, CKMB, CKMBINDEX, TROPONINI in the last 168 hours. BNP (last 3 results) No results for input(s): PROBNP in the last 8760 hours. HbA1C: No results for input(s): HGBA1C in the last 72 hours. CBG: No results for input(s): GLUCAP in the last 168 hours. Lipid Profile: No results for input(s): CHOL, HDL, LDLCALC, TRIG, CHOLHDL, LDLDIRECT in the last 72 hours. Thyroid  Function Tests: No results for input(s): TSH, T4TOTAL, FREET4, T3FREE, THYROIDAB in the last 72 hours. Anemia Panel: No results for input(s): VITAMINB12, FOLATE, FERRITIN, TIBC, IRON , RETICCTPCT in the last 72 hours. Urine analysis:    Component Value Date/Time   COLORURINE YELLOW 07/24/2021 1054   APPEARANCEUR CLOUDY (A) 07/24/2021 1054   LABSPEC 1.020 07/24/2021 1054   PHURINE 7.0 07/24/2021 1054   GLUCOSEU NEGATIVE 07/24/2021 1054   HGBUR MODERATE (A) 07/24/2021 1054   BILIRUBINUR NEGATIVE 07/24/2021 1054    BILIRUBINUR neg 01/14/2021 0848   KETONESUR TRACE (A) 07/24/2021 1054   PROTEINUR 30 (A) 07/24/2021 1054   UROBILINOGEN 0.2 01/14/2021 0848   NITRITE POSITIVE (A) 07/24/2021 1054   LEUKOCYTESUR LARGE (A) 07/24/2021 1054   Sepsis Labs: @LABRCNTIP (procalcitonin:4,lacticidven:4) )No results found for this or any previous visit (from the past 240 hours).   Radiological Exams on Admission:   Assessment/Plan Principal Problem:   Complete heart block (  HCC) Active Problems:   Atrial fibrillation, chronic (HCC)   Myocardial injury   (HFpEF) heart failure with preserved ejection fraction (HCC)   Primary hypertension   Leukocytosis   Bronchiectasis (HCC)   Chronic obstructive pulmonary disease (HCC)   Psoriasis vulgaris   Psoriatic arthritis (HCC)   Assessment and Plan:  Complete heart block [facility]): Patient is hemodynamically stable currently.  No chest pain or SOB.  ED physician consulted Dr. Florencio of cardiology, who recommended hold metoprolol  and amiodarone .  -Admitted to stepdown as inpatient -Pacemaker to the bedside - hold metoprolol  and amiodarone  -As needed atropine  for unstable symptomatic bradycardia -may consider dopamine drip for heart rates less than 40.    Chronic A-fib: - Hold amiodarone  and metoprolol  - Switch Pradaxa  to IV heparin  in case patient needs pacemaker placement  Myocardial injury: trop 21.  Patient does not have chest pain or SOB.  Likely demand ischemia. - Trend troponin - Patient is on IV heparin  as above  (HFpEF) heart failure with preserved ejection fraction (HCC): 2D echo on 05/19/2023 showed EF 60 to 65% with grade 2 diastolic dysfunction.  Patient does not have leg edema or JVD.  No SOB.  CHF seems to be compensated. -Check BNP  Primary hypertension -hydrochlorothiazide  - IV hydralazine  as needed - Metoprolol  is on hold  Leukocytosis: WBC 23.2, no source infection identified so far.  This seems to be chronic issue.  May be related to  psoriasis. -Follow-up with CBC - Follow-up UA  Bronchiectasis (HCC) and Chronic obstructive pulmonary disease (HCC):  stable.  No wheezing, no SOB. - Bronchodilators and as needed Mucinex   Hx of psoriasis vulgaris and psoriatic arthritis (HCC): - Patient is on weekly methotrexate  (every Sunday)         DVT ppx: on IV Heparin        Code Status: Full code    Family Communication:   Yes, patient's husband and son at bed side   Disposition Plan:  Anticipate discharge back to previous environment  Consults called: Dr. Florencio of cardiology is consulted.   Admission status and Level of care:  as inpt        Dispo: The patient is from: Home              Anticipated d/c is to: Home              Anticipated d/c date is: 2 days              Patient currently is not medically stable to d/c.    Severity of Illness:  The appropriate patient status for this patient is INPATIENT. Inpatient status is judged to be reasonable and necessary in order to provide the required intensity of service to ensure the patient's safety. The patient's presenting symptoms, physical exam findings, and initial radiographic and laboratory data in the context of their chronic comorbidities is felt to place them at high risk for further clinical deterioration. Furthermore, it is not anticipated that the patient will be medically stable for discharge from the hospital within 2 midnights of admission.   * I certify that at the point of admission it is my clinical judgment that the patient will require inpatient hospital care spanning beyond 2 midnights from the point of admission due to high intensity of service, high risk for further deterioration and high frequency of surveillance required.*       Date of Service 09/12/2023    Caleb Exon [facility]   If 7PM-7AM, please contact  night-coverage [email or web address] 09/12/2023, 7:43 PM

## 2023-09-12 NOTE — ED Triage Notes (Signed)
 First Nurse Note: Patient to ED via [facility] from home for weakness. Had surgery a few weeks ago. Not been eating much.  183/61 50 HR 90% RA 116 cbg

## 2023-09-12 NOTE — ED Triage Notes (Signed)
 Pt with decreased PO intake for several weeks. Pt recently discharged home from rehab post hip fracture. Pt is unable to get up today and her spouse says he is unable to pick her up and get her to the bathroom. Pt denies pain. She states, I feel lousy.  Pt endorses some nausea.

## 2023-09-13 DIAGNOSIS — R531 Weakness: Secondary | ICD-10-CM

## 2023-09-13 DIAGNOSIS — J449 Chronic obstructive pulmonary disease, unspecified: Secondary | ICD-10-CM

## 2023-09-13 DIAGNOSIS — J479 Bronchiectasis, uncomplicated: Secondary | ICD-10-CM | POA: Diagnosis not present

## 2023-09-13 DIAGNOSIS — R001 Bradycardia, unspecified: Secondary | ICD-10-CM

## 2023-09-13 DIAGNOSIS — I5030 Unspecified diastolic (congestive) heart failure: Secondary | ICD-10-CM | POA: Diagnosis not present

## 2023-09-13 DIAGNOSIS — I442 Atrioventricular block, complete: Secondary | ICD-10-CM | POA: Diagnosis not present

## 2023-09-13 DIAGNOSIS — Z515 Encounter for palliative care: Secondary | ICD-10-CM

## 2023-09-13 LAB — TSH: TSH: 3.407 u[IU]/mL (ref 0.350–4.500)

## 2023-09-13 LAB — CBC
HCT: 37.1 % (ref 36.0–46.0)
Hemoglobin: 12.2 g/dL (ref 12.0–15.0)
MCH: 29.4 pg (ref 26.0–34.0)
MCHC: 32.9 g/dL (ref 30.0–36.0)
MCV: 89.4 fL (ref 80.0–100.0)
Platelets: 230 K/uL (ref 150–400)
RBC: 4.15 MIL/uL (ref 3.87–5.11)
RDW: 15.3 % (ref 11.5–15.5)
WBC: 21.5 K/uL — ABNORMAL HIGH (ref 4.0–10.5)
nRBC: 0 % (ref 0.0–0.2)

## 2023-09-13 LAB — BASIC METABOLIC PANEL WITH GFR
Anion gap: 14 (ref 5–15)
BUN: 19 mg/dL (ref 8–23)
CO2: 23 mmol/L (ref 22–32)
Calcium: 7.9 mg/dL — ABNORMAL LOW (ref 8.9–10.3)
Chloride: 99 mmol/L (ref 98–111)
Creatinine, Ser: 0.51 mg/dL (ref 0.44–1.00)
GFR, Estimated: >60 mL/min (ref >60)
Glucose, Bld: 92 mg/dL (ref 70–99)
Potassium: 2.8 mmol/L — ABNORMAL LOW (ref 3.5–5.1)
Sodium: 136 mmol/L (ref 135–145)

## 2023-09-13 LAB — URINALYSIS, ROUTINE W REFLEX MICROSCOPIC
Bilirubin Urine: NEGATIVE
Glucose, UA: NEGATIVE mg/dL
Hgb urine dipstick: NEGATIVE
Ketones, ur: 20 mg/dL — AB
Leukocytes,Ua: NEGATIVE
Nitrite: NEGATIVE
Protein, ur: NEGATIVE mg/dL
Specific Gravity, Urine: 1.018 (ref 1.005–1.030)
pH: 5 (ref 5.0–8.0)

## 2023-09-13 LAB — HEPARIN LEVEL (UNFRACTIONATED)
Heparin Unfractionated: 0.2 [IU]/mL — ABNORMAL LOW (ref 0.30–0.70)
Heparin Unfractionated: 0.5 [IU]/mL (ref 0.30–0.70)
Heparin Unfractionated: 0.53 [IU]/mL (ref 0.30–0.70)

## 2023-09-13 LAB — TROPONIN I (HIGH SENSITIVITY): Troponin I (High Sensitivity): 22 ng/L — ABNORMAL HIGH (ref <18)

## 2023-09-13 MED ORDER — MIRTAZAPINE 15 MG PO TABS
15.0000 mg | ORAL_TABLET | Freq: Every day | ORAL | Status: DC
  Administered 2023-09-13 – 2023-09-20 (×6): 15 mg via ORAL
  Filled 2023-09-13 (×6): qty 1

## 2023-09-13 MED ORDER — HEPARIN BOLUS VIA INFUSION
900.0000 [IU] | Freq: Once | INTRAVENOUS | Status: AC
  Administered 2023-09-13: 900 [IU] via INTRAVENOUS
  Filled 2023-09-13: qty 900

## 2023-09-13 MED ORDER — METOCLOPRAMIDE HCL 5 MG/ML IJ SOLN
10.0000 mg | Freq: Three times a day (TID) | INTRAMUSCULAR | Status: DC
  Administered 2023-09-13 – 2023-09-15 (×7): 10 mg via INTRAVENOUS
  Filled 2023-09-13 (×7): qty 2

## 2023-09-13 MED ORDER — LOSARTAN POTASSIUM 25 MG PO TABS
25.0000 mg | ORAL_TABLET | Freq: Every day | ORAL | Status: DC
  Administered 2023-09-14 – 2023-09-16 (×3): 25 mg via ORAL
  Filled 2023-09-13 (×3): qty 1

## 2023-09-13 MED ORDER — PANTOPRAZOLE SODIUM 40 MG IV SOLR
40.0000 mg | INTRAVENOUS | Status: DC
  Administered 2023-09-13 – 2023-09-14 (×2): 40 mg via INTRAVENOUS
  Filled 2023-09-13 (×2): qty 10

## 2023-09-13 MED ORDER — POTASSIUM CHLORIDE CRYS ER 20 MEQ PO TBCR
40.0000 meq | EXTENDED_RELEASE_TABLET | ORAL | Status: AC
  Administered 2023-09-13: 40 meq via ORAL
  Filled 2023-09-13 (×2): qty 2

## 2023-09-13 MED ORDER — ONDANSETRON HCL 4 MG/2ML IJ SOLN
4.0000 mg | Freq: Four times a day (QID) | INTRAMUSCULAR | Status: DC | PRN
  Administered 2023-09-17: 4 mg via INTRAVENOUS
  Filled 2023-09-13: qty 2

## 2023-09-13 NOTE — Progress Notes (Signed)
 PROGRESS NOTE    [name]  FMW:[id number] DOB: 08-28-1933 DOA: 09/12/2023 PCP: Cleotilde Oneil FALCON, MD    Brief Narrative:  89 y.o. female with medical history significant of HTN, HLD, HFpEF, A-fib on Pradaxa , COPD, bronchiectasis, GERD, breast cancer s/p of a left lumpectomy), Psoriatec arthritis, mild pulmonary hypertension, recent right hip fracture, who presents with weakness.   Patient was recently hospitalized from 6/11 - 6/16 due to mechanical fall and right hip fracture.  Patient was s/p of right hip surgery.  Patient states that her surgical site has been healing well.  In the past several days she has been feeling generalized weak, with poor appetite and decreased oral intake, cannot ambulate without assistance due to weakness.  No fall.  She has nausea, no vomiting, diarrhea or abdominal pain.  No chest pain, cough, SOB.  Denies symptoms of UTI.  No rectal bleeding or dark stool.  Per her husband, patient's heart rate was at 40s when he checked it for her at home.   Pt was found to have new complete heart block on EKG with heart rate in 40s in ED. Pt is taking amiodarone  200 mg daily and metoprolol  37.5 mg twice daily for A-fib currently.    Assessment & Plan:   Principal Problem:   Complete heart block (HCC) Active Problems:   Atrial fibrillation, chronic (HCC)   Myocardial injury   (HFpEF) heart failure with preserved ejection fraction (HCC)   Primary hypertension   Leukocytosis   Bronchiectasis (HCC)   Chronic obstructive pulmonary disease (HCC)   Psoriasis vulgaris   Psoriatic arthritis (HCC)   Symptomatic bradycardia  High degree heart block Symptomatic bradycardia Currently hemodynamically stable.  No chest pain or shortness of breath.  Heart rate 40.  EDP consulted with cardiology who recommend holding metoprolol  and amiodarone . Plan: Downgrade to cardiac telemetry Continue holding amiodarone  and metoprolol  Telemetry monitoring Cardiology following Currently no  plans for pacemaker  Chronic A-fib: Currently bradycardic.  Amiodarone  metoprolol  on hold   Myocardial injury:  trop 21.  Patient does not have chest pain or SOB.  Likely demand ischemia.  (HFpEF) heart failure with preserved ejection fraction (HCC):  2D echo on 05/19/2023 showed EF 60 to 65% with grade 2 diastolic dysfunction.  Patient does not have leg edema or JVD.  No SOB.  CHF seems to be compensated. - No indication for diuretic currently   Primary hypertension Continue hydrochlorothiazide .  IV hydralazine  as needed.  Home metoprolol    Leukocytosis:  Appears to be WBC 23.2, no source infection identified so far.  Appears to be chronic issue .  No indication of infection.  Hold antibiotics.   Bronchiectasis (HCC) and Chronic obstructive pulmonary disease (HCC):  stable.  No wheezing, no SOB. - Continue bronchodilators and as needed Mucinex    Hx of psoriasis vulgaris and psoriatic arthritis (HCC): - Patient is on weekly methotrexate  (every Sunday)  Dry heaving Poor p.o. intake Intractable nausea Again appears to be chronic issue for patient.  Previously responded to Reglan  and Protonix .  No longer responding.  Patient's oral intake has been very poor per husband over past several weeks however upon review of medical record seems that patient has had these issues since at least 2023 when she was evaluated by outpatient GI.  Will involve SLP.  Rule out oropharyngeal dysphagia.  Involve RD.  If patient agreeable may involve GI for consideration of endoluminal evaluation   DVT prophylaxis: Heparin  Code Status: Full Family Communication: Husband at bedside 7/9 Disposition  Plan: Status is: Inpatient Remains inpatient appropriate because: Multiple acute issues as above   Level of care: Telemetry Cardiac  Consultants:  Cardiology  Procedures:  None  Antimicrobials: None   Subjective: Seen and examined.  Resting in bed.  Appears very weak and lethargic.  Husband at  bedside.  Objective: Vitals:   09/13/23 1400 09/13/23 1416 09/13/23 1430 09/13/23 1500  BP: (!) 162/53  (!) 148/50 (!) 165/52  Pulse: (!) 45  (!) 45 (!) 44  Resp: (!) 24  (!) 25 (!) 23  Temp:  98.5 F (36.9 C)    TempSrc:  Oral    SpO2: 96%  95% 96%  Weight:      Height:        Intake/Output Summary (Last 24 hours) at 09/13/2023 1555 Last data filed at 09/12/2023 2000 Gross per 24 hour  Intake 1000 ml  Output --  Net 1000 ml   Filed Weights   09/12/23 1936  Weight: 58.9 kg    Examination:  General exam: NAD.  Appears weak and lethargic Respiratory system: Poor respiratory effort.  Lungs clear.  Normal work of breathing.  Room air Cardiovascular system: S1 S2, bradycardic, regular rhythm, no murmurs, no pedal edema Gastrointestinal system: Soft, NT/ND, normal bowel sounds Central nervous system: Alert and oriented. No focal neurological deficits. Extremities: Decreased power bilateral lower extremities Skin: No rashes, lesions or ulcers Psychiatry: Judgement and insight appear impaired. Mood & affect depressed.     Data Reviewed: I have personally reviewed following labs and imaging studies  CBC: Recent Labs  Lab 09/12/23 1745 09/13/23 0353  WBC 23.2* 21.5*  NEUTROABS 19.9*  --   HGB 14.1 12.2  HCT 41.7 37.1  MCV 87.8 89.4  PLT 273 230   Basic Metabolic Panel: Recent Labs  Lab 09/12/23 1745 09/13/23 0353  NA 135 136  K 3.5 2.8*  CL 98 99  CO2 24 23  GLUCOSE 120* 92  BUN 22 19  CREATININE 0.64 0.51  CALCIUM  8.9 7.9*  MG 1.9  --    GFR: Estimated Creatinine Clearance: 39.2 mL/min (by C-G formula based on SCr of 0.51 mg/dL). Liver Function Tests: Recent Labs  Lab 09/12/23 1745  AST 20  ALT 12  ALKPHOS 87  BILITOT 1.0  PROT 7.2  ALBUMIN 3.3*   No results for input(s): LIPASE, AMYLASE in the last 168 hours. No results for input(s): AMMONIA in the last 168 hours. Coagulation Profile: Recent Labs  Lab 09/12/23 2322  INR 1.3*    Cardiac Enzymes: No results for input(s): CKTOTAL, CKMB, CKMBINDEX, TROPONINI in the last 168 hours. BNP (last 3 results) No results for input(s): PROBNP in the last 8760 hours. HbA1C: No results for input(s): HGBA1C in the last 72 hours. CBG: No results for input(s): GLUCAP in the last 168 hours. Lipid Profile: No results for input(s): CHOL, HDL, LDLCALC, TRIG, CHOLHDL, LDLDIRECT in the last 72 hours. Thyroid  Function Tests: Recent Labs    09/13/23 0103  TSH 3.407   Anemia Panel: No results for input(s): VITAMINB12, FOLATE, FERRITIN, TIBC, IRON , RETICCTPCT in the last 72 hours. Sepsis Labs: No results for input(s): PROCALCITON, LATICACIDVEN in the last 168 hours.  No results found for this or any previous visit (from the past 240 hours).       Radiology Studies: DG Chest Portable 1 View Result Date: 09/12/2023 CLINICAL DATA:  Weakness, shortness of breath EXAM: PORTABLE CHEST 1 VIEW COMPARISON:  07/18/2023 FINDINGS: Heart and mediastinal contours within normal limits. Aortic  atherosclerosis. Right lung clear. Patchy opacity in the left lower lung again noted, improved since prior study. No visible effusions or acute bony abnormality. Biapical scarring. IMPRESSION: Persistent but improving left basilar atelectasis or infiltrate. No visible effusions. Electronically Signed   By: Franky Crease M.D.   On: 09/12/2023 19:30        Scheduled Meds:  budesonide -glycopyrrolate -formoterol   2 puff Inhalation BID   folic acid   1 mg Oral Daily   LORazepam   0.5 mg Oral QHS   losartan   25 mg Oral Daily   metoCLOPramide  (REGLAN ) injection  10 mg Intravenous Q8H   mirtazapine   15 mg Oral QHS   pantoprazole  (PROTONIX ) IV  40 mg Intravenous Q24H   Continuous Infusions:  heparin  950 Units/hr (09/13/23 1355)     LOS: 1 day      Calvin KATHEE Robson, MD [facility]   If 7PM-7AM, please contact night-coverage  09/13/2023, 3:55 PM

## 2023-09-13 NOTE — Consult Note (Signed)
 [facility] CARDIOLOGY CONSULT NOTE       Patient ID: [name] MRN: [id number] DOB/AGE: 1933-10-19 89 y.o.  Admit date: 09/12/2023 Referring Physician Dr. Hilma Primary Physician Cleotilde Oneil FALCON, MD Primary Cardiologist Tinnie Maiden, NP; Dr. Dewane Reason for Consultation AV heart block  HPI: [name] is a 89 y.o. female  with a past medical history of history bradycardia, hypokalemia, paroxysmal atrial fibrillation (on Pradaxa ), chronic HFpEF, mild pulmonary hypertension, hypertension, hyperlipidemia, pulmonary fibrosis, COPD who presented to the ED on 09/12/2023 for worsening generalized weakness and poor intake.  Of note patient had a recent fall that resulted in right hip fracture and 08/16/2023 and recently got out of rehab about a week ago.  Heart rates were found to be in the 40s.  Cardiology was consulted for further evaluation.   Patient presented to the ED with generalized weakness and low HR in 40s. Work up in the ED notable for sodium 135, potassium 3.5, creatinine 0.64, hemoglobin 14.1, platelets 273.  LFTs within normal limits.  TSH within normal limits.  Albumin 3.3.  Chest x-ray with no evidence of congestion or pleural effusions.  BNP elevated at 770.  Troponins minimally elevated and flat 21 > 20 > 16 > 22.  EKG with high grade AV block, rate 50 bpm with nonspecific ST-T wave changes.  Patient's home metoprolol  and Amio held.  External pacing pads placed in ED.   At the time of my evaluation this AM, patient was resting comfortably in ED stretcher.  We discussed patient's symptoms in further detail.  Patient states she came into the ED due to worsening generalized weakness and unable to walk.  Spoke to patient's husband and daughter and they report patient has been having generalized weakness for several weeks now and states yesterday they noticed significant worsening in weakness.  Patient lives at home with husband and has not states he has been taking care of her  however that has been more difficult since patient has not been able to stand as well due to weakness.  Family also reports poor oral intake patient does not want to eat or drink due to persistent nausea that is been going on for several weeks.  Patient used to be very active about a year ago able to play around the golf and go on walks with her husband, ever this has changed over the last several weeks.   Pertinent Cardiac History (Most recent) 3 day Holter monitor:08/01/2023 to 08/04/2023 Predominant rhythm is sinus rhythm. Maximum heart rate 141 bpm, minimum heart rate 57 bpm the average heart rate of 74 bpm Occasional less than 1% PACs and rare PVCs noted 8 occurrences of short lasting supraventricular tachycardia, longest episode 17 beats 0 patient triggers  Impression: Predominant rhythm is sinus rhythm. No significant arrhythmias.   Review of systems complete and found to be negative unless listed above    Past Medical History:  Diagnosis Date   Acquired thrombophilia (HCC)    Acute metabolic encephalopathy 06/19/2020   Allergy    Atherosclerosis of aorta (HCC)    Atrial fibrillation (HCC)    Bronchiectasis (HCC)    Cancer (HCC)    left breast cancer    Chronic nausea    COPD (chronic obstructive pulmonary disease) (HCC)    GERD (gastroesophageal reflux disease)    Grade II diastolic dysfunction    Hyperlipemia    Hypertension    Intractable nausea and vomiting 06/20/2018   Mild concentric left ventricular hypertrophy (LVH)  Mild mitral stenosis by prior echocardiogram    Mild pulmonary hypertension (HCC)    Psoriatic arthritis (HCC)    Stage 2 moderate COPD by GOLD classification [facility])     Past Surgical History:  Procedure Laterality Date   BREAST LUMPECTOMY Left 2008   CATARACT EXTRACTION W/PHACO Left 02/13/2023   Procedure: CATARACT EXTRACTION PHACO AND INTRAOCULAR LENS PLACEMENT (IOC) LEFT  KDB GONIOTOMY;  Surgeon: Enola Feliciano Hugger, MD;  Location: [facility]  SURGERY CNTR;  Service: Ophthalmology;  Laterality: Left;  10.53 0:55.   CATARACT EXTRACTION W/PHACO Right 02/27/2023   Procedure: CATARACT EXTRACTION PHACO AND INTRAOCULAR LENS PLACEMENT (IOC) RIGHT  [place], goniotomy 15.90 01:28.6;  Surgeon: Enola Feliciano Hugger, MD;  Location: [facility] SURGERY [facility];  Service: Ophthalmology;  Laterality: Right;   CHOLECYSTECTOMY     HIP PINNING,CANNULATED Left 06/05/2020   Procedure: CANNULATED HIP PINNING;  Surgeon: Kathlynn Sharper, MD;  Location: [facility] ORS;  Service: Orthopedics;  Laterality: Left;   HIP PINNING,CANNULATED Right 08/16/2023   Procedure: FIXATION, FEMUR, NECK, PERCUTANEOUS, USING SCREW;  Surgeon: Edie Norleen PARAS, MD;  Location: [facility] ORS;  Service: Orthopedics;  Laterality: Right;   PARTIAL HYSTERECTOMY     TEE WITHOUT CARDIOVERSION N/A 06/10/2020   Procedure: TRANSESOPHAGEAL ECHOCARDIOGRAM (TEE);  Surgeon: Darliss Rogue, MD;  Location: [facility] ORS;  Service: Cardiovascular;  Laterality: N/A;    (Not in a hospital admission)  Social History   Socioeconomic History   Marital status: Married    Spouse name: Not on file   Number of children: 2   Years of education: Not on file   Highest education level: Not on file  Occupational History   Not on file  Tobacco Use   Smoking status: Never   Smokeless tobacco: Never  Vaping Use   Vaping status: Never Used  Substance and Sexual Activity   Alcohol use: No   Drug use: No   Sexual activity: Not on file  Other Topics Concern   Not on file  Social History Narrative   Not on file   Social Drivers of Health   Financial Resource Strain: Low Risk  (08/10/2023)   Received from [facility]   Overall Financial Resource Strain (CARDIA)    Difficulty of Paying Living Expenses: Not hard at all  Food Insecurity: Patient Declined (08/16/2023)   Hunger Vital Sign    Worried About Running Out of Food in the Last Year: Patient declined    Ran Out of Food in the Last Year: Patient  declined  Transportation Needs: Patient Declined (08/16/2023)   PRAPARE - Administrator, Civil Service (Medical): Patient declined    Lack of Transportation (Non-Medical): Patient declined  Physical Activity: Sufficiently Active (02/17/2021)   Exercise Vital Sign    Days of Exercise per Week: 7 days    Minutes of Exercise per Session: 30 min  Stress: No Stress Concern Present (02/17/2021)   [employer] of Occupational Health - Occupational Stress Questionnaire    Feeling of Stress : Not at all  Social Connections: Patient Declined (08/16/2023)   Social Connection and Isolation Panel    Frequency of Communication with Friends and Family: Patient declined    Frequency of Social Gatherings with Friends and Family: Patient declined    Attends Religious Services: Patient declined    Database administrator or Organizations: Patient declined    Attends Banker Meetings: Patient declined    Marital Status: Patient declined  Recent Concern: Social Connections - Moderately Isolated (  07/18/2023)   Social Connection and Isolation Panel    Frequency of Communication with Friends and Family: More than three times a week    Frequency of Social Gatherings with Friends and Family: More than three times a week    Attends Religious Services: Never    Database administrator or Organizations: No    Attends Banker Meetings: Never    Marital Status: Married  Catering manager Violence: Patient Declined (08/16/2023)   Humiliation, Afraid, Rape, and Kick questionnaire    Fear of Current or Ex-Partner: Patient declined    Emotionally Abused: Patient declined    Physically Abused: Patient declined    Sexually Abused: Patient declined    Family History  Problem Relation Age of Onset   Breast cancer Sister      Vitals:   09/13/23 0630 09/13/23 0700 09/13/23 0730 09/13/23 0800  BP: (!) 133/39 (!) 134/41 (!) 135/39 (!) 140/43  Pulse:  (!) 46 (!) 45 (!) 45  Resp:  (!) 23 (!) 23 (!) 23 (!) 23  Temp:      TempSrc:      SpO2:  95% 94% 96%  Weight:      Height:        PHYSICAL EXAM General: Chronically ill-appearing elderly female, well nourished, in no acute distress. HEENT: Normocephalic and atraumatic. Neck: No JVD.   Lungs: Normal respiratory effort on room air. Clear bilaterally to auscultation. No wheezes, crackles, rhonchi.  Heart: HRR, slow rates. Normal S1 and S2 without gallops or murmurs.  Abdomen: Non-distended appearing.  Msk: Normal strength and tone for age. Extremities: Warm and well perfused. No clubbing, cyanosis, edema.  Neuro: Alert and oriented X 3. Psych: Answers questions appropriately.   Labs: Basic Metabolic Panel: Recent Labs    09/12/23 1745 09/13/23 0353  NA 135 136  K 3.5 2.8*  CL 98 99  CO2 24 23  GLUCOSE 120* 92  BUN 22 19  CREATININE 0.64 0.51  CALCIUM  8.9 7.9*  MG 1.9  --    Liver Function Tests: Recent Labs    09/12/23 1745  AST 20  ALT 12  ALKPHOS 87  BILITOT 1.0  PROT 7.2  ALBUMIN 3.3*   No results for input(s): LIPASE, AMYLASE in the last 72 hours. CBC: Recent Labs    09/12/23 1745 09/13/23 0353  WBC 23.2* 21.5*  NEUTROABS 19.9*  --   HGB 14.1 12.2  HCT 41.7 37.1  MCV 87.8 89.4  PLT 273 230   Cardiac Enzymes: Recent Labs    09/12/23 1856 09/12/23 2322 09/13/23 0103  TROPONINIHS 20* 16 22*   BNP: Recent Labs    09/12/23 1745  BNP 776.7*   D-Dimer: No results for input(s): DDIMER in the last 72 hours. Hemoglobin A1C: No results for input(s): HGBA1C in the last 72 hours. Fasting Lipid Panel: No results for input(s): CHOL, HDL, LDLCALC, TRIG, CHOLHDL, LDLDIRECT in the last 72 hours. Thyroid  Function Tests: Recent Labs    09/13/23 0103  TSH 3.407   Anemia Panel: No results for input(s): VITAMINB12, FOLATE, FERRITIN, TIBC, IRON , RETICCTPCT in the last 72 hours.   Radiology: DG Chest Portable 1 View Result Date: 09/12/2023 CLINICAL  DATA:  Weakness, shortness of breath EXAM: PORTABLE CHEST 1 VIEW COMPARISON:  07/18/2023 FINDINGS: Heart and mediastinal contours within normal limits. Aortic atherosclerosis. Right lung clear. Patchy opacity in the left lower lung again noted, improved since prior study. No visible effusions or acute bony abnormality. Biapical scarring. IMPRESSION: Persistent  but improving left basilar atelectasis or infiltrate. No visible effusions. Electronically Signed   By: Franky Crease M.D.   On: 09/12/2023 19:30   DG HIP UNILAT WITH PELVIS 2-3 VIEWS RIGHT Result Date: 08/16/2023 CLINICAL DATA:  Elective surgery.  Right hip. EXAM: DG HIP (WITH OR WITHOUT PELVIS) 2-3V RIGHT COMPARISON:  Pelvis and right hip radiographs 08/16/2023 and CT right hip 08/16/2023 FINDINGS: Images were performed intraoperatively without the presence of a radiologist. The patient is undergoing ORIF of the previously seen subcapital proximal right femoral fracture with 3 longitudinal screws. Total fluoroscopy images: 5 Total fluoroscopy time: 34 seconds Total dose: Radiation Exposure Index (as provided by the fluoroscopic device): 5.1 mGy air Kerma Please see intraoperative findings for further detail. IMPRESSION: Intraoperative fluoroscopy for ORIF of the proximal right femoral fracture. Electronically Signed   By: Tanda Lyons M.D.   On: 08/16/2023 17:46   DG C-Arm 1-60 Min-No Report Result Date: 08/16/2023 Fluoroscopy was utilized by the requesting physician.  No radiographic interpretation.   CT Hip Right Wo Contrast Result Date: 08/16/2023 CLINICAL DATA:  Right hip pain after fall. EXAM: CT OF THE RIGHT HIP WITHOUT CONTRAST TECHNIQUE: Multidetector CT imaging of the right hip was performed according to the standard protocol. Multiplanar CT image reconstructions were also generated. RADIATION DOSE REDUCTION: This exam was performed according to the departmental dose-optimization program which includes automated exposure control, adjustment  of the mA and/or kV according to patient size and/or use of iterative reconstruction technique. COMPARISON:  Same day radiographs of the right hip dated 08/16/2023. FINDINGS: Bones/Joint/Cartilage Subtle region of cortical and trabecular lucency along the posterior femoral head and neck junction (series 7, image 60), may reflect a nondisplaced fracture. The remainder of the visualized bones are intact. The right femoral head is seated within the acetabulum. Moderate osteoarthritis of the right hip manifested by joint space narrowing and marginal osteophytosis. Ligaments Ligaments are suboptimally evaluated by CT. Muscles and Tendons Muscles are within normal limits. No intramuscular fluid collection or hematoma. Soft tissue No fluid collection or hematoma. No soft tissue mass. Small fat containing right inguinal hernia. IMPRESSION: 1. Subtle region of cortical and trabecular lucency along the right posterior femoral head and neck junction may reflect a nondisplaced fracture. The remainder of the visualized bones are intact 2. Moderate osteoarthritis of the right hip. Electronically Signed   By: Harrietta Sherry M.D.   On: 08/16/2023 12:33   DG Hip Unilat W or Wo Pelvis 2-3 Views Right Result Date: 08/16/2023 CLINICAL DATA:  Right hip pain after fall today. EXAM: DG HIP (WITH OR WITHOUT PELVIS) 2-3V RIGHT COMPARISON:  None Available. FINDINGS: There is no evidence of hip fracture or dislocation. There is no evidence of arthropathy or other focal bone abnormality. IMPRESSION: Negative. Electronically Signed   By: Lynwood Landy Raddle M.D.   On: 08/16/2023 11:16   CT Head Wo Contrast Result Date: 08/16/2023 CLINICAL DATA:  Trauma, fall, fell off of stool and hit right-side of head. EXAM: CT HEAD WITHOUT CONTRAST CT CERVICAL SPINE WITHOUT CONTRAST TECHNIQUE: Multidetector CT imaging of the head and cervical spine was performed following the standard protocol without intravenous contrast. Multiplanar CT image  reconstructions of the cervical spine were also generated. RADIATION DOSE REDUCTION: This exam was performed according to the departmental dose-optimization program which includes automated exposure control, adjustment of the mA and/or kV according to patient size and/or use of iterative reconstruction technique. COMPARISON:  CT head 06/05/2020. FINDINGS: CT HEAD FINDINGS Brain: No acute intracranial  hemorrhage. Redemonstrated remote lacunar infarct versus prominent perivascular space in the inferior right basal ganglia. Increased heterogeneity of the basal ganglia particularly on the left likely related to chronic microvascular changes. Nonspecific hypoattenuation in the periventricular and subcortical white matter favored to reflect chronic microvascular ischemic changes. Mild parenchymal volume loss. No edema, mass effect, or midline shift. The basilar cisterns are patent. Ventricles: The ventricles are normal. Vascular: Atherosclerotic calcifications of the carotid siphons and intracranial vertebral arteries. No hyperdense vessel. Skull: No acute or aggressive finding. Orbits: Orbits are symmetric. Sinuses: The visualized paranasal sinuses are clear. Other: Mastoid air cells are clear. CT CERVICAL SPINE FINDINGS Alignment: Cervical lordosis is maintained. Trace retrolisthesis of C4 on C5. No facet subluxation or dislocation. Skull base and vertebrae: No compression fracture or displaced fracture in the cervical spine. No suspicious osseous lesion. Chronic appearing deformity of the medial left clavicle. Soft tissues and spinal canal: No prevertebral fluid or swelling. No visible canal hematoma. Disc levels: Intervertebral disc space narrowing greatest at C4-5. Disc osteophyte complexes at multiple levels. No high-grade osseous spinal canal stenosis. Facet arthrosis and uncovertebral hypertrophy throughout the cervical spine. Foraminal stenosis most pronounced on the right at C6-7. Upper chest: Biapical  pleuroparenchymal scarring and pleural calcifications. Other: None IMPRESSION: No CT evidence of acute intracranial abnormality. No acute fracture or traumatic malalignment of the cervical spine. Chronic appearing deformity of the medial left clavicle. Redemonstrated chronic microvascular ischemic changes. Degenerative changes of the cervical spine as above. Electronically Signed   By: Donnice Mania M.D.   On: 08/16/2023 11:07   CT Cervical Spine Wo Contrast Result Date: 08/16/2023 CLINICAL DATA:  Trauma, fall, fell off of stool and hit right-side of head. EXAM: CT HEAD WITHOUT CONTRAST CT CERVICAL SPINE WITHOUT CONTRAST TECHNIQUE: Multidetector CT imaging of the head and cervical spine was performed following the standard protocol without intravenous contrast. Multiplanar CT image reconstructions of the cervical spine were also generated. RADIATION DOSE REDUCTION: This exam was performed according to the departmental dose-optimization program which includes automated exposure control, adjustment of the mA and/or kV according to patient size and/or use of iterative reconstruction technique. COMPARISON:  CT head 06/05/2020. FINDINGS: CT HEAD FINDINGS Brain: No acute intracranial hemorrhage. Redemonstrated remote lacunar infarct versus prominent perivascular space in the inferior right basal ganglia. Increased heterogeneity of the basal ganglia particularly on the left likely related to chronic microvascular changes. Nonspecific hypoattenuation in the periventricular and subcortical white matter favored to reflect chronic microvascular ischemic changes. Mild parenchymal volume loss. No edema, mass effect, or midline shift. The basilar cisterns are patent. Ventricles: The ventricles are normal. Vascular: Atherosclerotic calcifications of the carotid siphons and intracranial vertebral arteries. No hyperdense vessel. Skull: No acute or aggressive finding. Orbits: Orbits are symmetric. Sinuses: The visualized paranasal  sinuses are clear. Other: Mastoid air cells are clear. CT CERVICAL SPINE FINDINGS Alignment: Cervical lordosis is maintained. Trace retrolisthesis of C4 on C5. No facet subluxation or dislocation. Skull base and vertebrae: No compression fracture or displaced fracture in the cervical spine. No suspicious osseous lesion. Chronic appearing deformity of the medial left clavicle. Soft tissues and spinal canal: No prevertebral fluid or swelling. No visible canal hematoma. Disc levels: Intervertebral disc space narrowing greatest at C4-5. Disc osteophyte complexes at multiple levels. No high-grade osseous spinal canal stenosis. Facet arthrosis and uncovertebral hypertrophy throughout the cervical spine. Foraminal stenosis most pronounced on the right at C6-7. Upper chest: Biapical pleuroparenchymal scarring and pleural calcifications. Other: None IMPRESSION: No CT evidence of acute  intracranial abnormality. No acute fracture or traumatic malalignment of the cervical spine. Chronic appearing deformity of the medial left clavicle. Redemonstrated chronic microvascular ischemic changes. Degenerative changes of the cervical spine as above. Electronically Signed   By: Donnice Mania M.D.   On: 08/16/2023 11:07    ECHO ordered. 05/19/2023 1. Left ventricular ejection fraction, by estimation, is 60 to 65%. The left ventricle has normal function. The left ventricle has no regional wall motion abnormalities. There is mild left ventricular hypertrophy. Left ventricular diastolic parameters are consistent with Grade II diastolic dysfunction (pseudonormalization).   2. Right ventricular systolic function is normal. The right ventricular size is normal. There is mildly elevated pulmonary artery systolic pressure.   3. Left atrial size was mildly dilated.   4. The mitral valve is normal in structure. No evidence of mitral valve regurgitation. Mild mitral stenosis. The mean mitral valve gradient is 4.0 mmHg. Severe mitral annular  calcification.   5. Tricuspid valve regurgitation is mild to moderate.   6. The aortic valve is normal in structure. Aortic valve regurgitation is not visualized. Aortic valve sclerosis is present, with no evidence of aortic valve stenosis.   7. The inferior vena cava is normal in size with greater than 50% respiratory variability, suggesting right atrial pressure of 3 mmHg.    TELEMETRY reviewed by me 09/13/2023: high grade AV block, rate 40s  EKG reviewed by me:  high grade AV block, rate 50 bpm with nonspecific ST-T wave changes  Data reviewed by me 09/13/2023: last 24h vitals tele labs imaging I/O ED provider note, admission H&P.  Principal Problem:   Complete heart block (HCC) Active Problems:   Psoriasis vulgaris   Primary hypertension   Bronchiectasis (HCC)   Chronic obstructive pulmonary disease (HCC)   Leukocytosis   (HFpEF) heart failure with preserved ejection fraction (HCC)   Psoriatic arthritis (HCC)   Myocardial injury   Atrial fibrillation, chronic (HCC)    ASSESSMENT AND PLAN:  [name] is a 89 y.o. female  with a past medical history of history bradycardia, hypokalemia, paroxysmal atrial fibrillation (on Pradaxa ), chronic HFpEF, mild pulmonary hypertension, hyperlipidemia, pulmonary fibrosis, COPD who presented to the ED on 09/12/2023 for worsening generalized weakness and poor intake.  Of note patient had a recent fall that resulted in right hip fracture and 08/16/2023 and recently got out of rehab about a week ago.  Heart rates were found to be in the 40s.  Cardiology was consulted for further evaluation.  # High-grade AV block # History of bradycardia # Paroxysmal atrial fibrillation Patient presents with worsening generalized weakness and poor intake for the last several weeks.  Per husband heart rates have been running in mid 40s for the last several weeks.  EKG in ED revealed high-grade AV block with rate 50.  Per telemetry rate remains in 40s.  TSH within normal  limits.   -Echo ordered. -Avoid all AVN blockers.  -Hold home metoprolol  and amiodarone . (Last taken on 07/08) -Monitor and replenish electrolytes for a goal K >4, Mag >2  -Continue heparin  infusion for stroke risk reduction. (Home Pradaxa  held) -Discussed with patient, patient's husband and daughter regarding slow heart rates. Family states patient has HR in mid 40s for past several weeks. Per tele patient HR remains in 40s with no significant pauses. Discussed holding home metoprolol  and amiodarone  and allowing medication to get out of system. Also discussed possibility of PPM if HR do not improve or worsen and that would require transfer to  [facility] for procedure. Family and patient agree with monitoring telemetry/HR for now to see if HR improves off of AVN blockers. -Agree with palliative consult.  -Recommend adequate hydration and p.o. intake.  Recommend PT for deconditioning.  # Chronic HFpEF Patient without shortness of breath or LEE. Chest x-ray with no evidence of congestion or pleural effusions.  BNP elevated at 770.  -Continue to hold home PO lasix  40 mg daily as patient does not appear volume up.  -Will plan to optimize GDMT as BP and renal function allow.   # Hypertension # Hyperlipidemia # Demand ischemia Patient without chest pain. Troponins minimally elevated and flat 21 > 20 > 16 > 22.  EKG with high grade AV block, rate 50 bpm with nonspecific ST-T wave changes.  -D/c hydrochlorothiazide  due to electrolyte abnormalities, hypokalemia. -Ordered losartan  25 mg daily.  -Hold metoprolol  as stated above.  This patient's plan of care was discussed and created with Dr. Florencio and he is in agreement.  Signed: Dorene Comfort, PA-C  09/13/2023, 8:15 AM [facility]

## 2023-09-13 NOTE — Evaluation (Signed)
 Clinical/Bedside Swallow Evaluation Patient Details  Name: [name] MRN: [id number] Date of Birth: 05-31-1933  Today's Date: 09/13/2023 Time: SLP Start Time (ACUTE ONLY): 1500 SLP Stop Time (ACUTE ONLY): 1535 SLP Time Calculation (min) (ACUTE ONLY): 35 min  Past Medical History:  Past Medical History:  Diagnosis Date   Acquired thrombophilia (HCC)    Acute metabolic encephalopathy 06/19/2020   Allergy    Atherosclerosis of aorta (HCC)    Atrial fibrillation (HCC)    Bronchiectasis (HCC)    Cancer (HCC)    left breast cancer    Chronic nausea    COPD (chronic obstructive pulmonary disease) (HCC)    GERD (gastroesophageal reflux disease)    Grade II diastolic dysfunction    Hyperlipemia    Hypertension    Intractable nausea and vomiting 06/20/2018   Mild concentric left ventricular hypertrophy (LVH)    Mild mitral stenosis by prior echocardiogram    Mild pulmonary hypertension (HCC)    Psoriatic arthritis (HCC)    Stage 2 moderate COPD by GOLD classification [facility])    Past Surgical History:  Past Surgical History:  Procedure Laterality Date   BREAST LUMPECTOMY Left 2008   CATARACT EXTRACTION W/PHACO Left 02/13/2023   Procedure: CATARACT EXTRACTION PHACO AND INTRAOCULAR LENS PLACEMENT (IOC) LEFT  KDB GONIOTOMY;  Surgeon: Enola Feliciano Hugger, MD;  Location: [facility];  Service: Ophthalmology;  Laterality: Left;  10.53 0:55.   CATARACT EXTRACTION W/PHACO Right 02/27/2023   Procedure: CATARACT EXTRACTION PHACO AND INTRAOCULAR LENS PLACEMENT (IOC) RIGHT  [place], goniotomy 15.90 01:28.6;  Surgeon: Enola Feliciano Hugger, MD;  Location: [facility];  Service: Ophthalmology;  Laterality: Right;   CHOLECYSTECTOMY     HIP PINNING,CANNULATED Left 06/05/2020   Procedure: CANNULATED HIP PINNING;  Surgeon: Kathlynn Sharper, MD;  Location: [facility];  Service: Orthopedics;  Laterality: Left;   HIP PINNING,CANNULATED Right 08/16/2023   Procedure: FIXATION, FEMUR, NECK,  PERCUTANEOUS, USING SCREW;  Surgeon: Edie Norleen PARAS, MD;  Location: [facility];  Service: Orthopedics;  Laterality: Right;   PARTIAL HYSTERECTOMY     TEE WITHOUT CARDIOVERSION N/A 06/10/2020   Procedure: TRANSESOPHAGEAL ECHOCARDIOGRAM (TEE);  Surgeon: Darliss Rogue, MD;  Location: [facility];  Service: Cardiovascular;  Laterality: N/A;   HPI:  Per H&P, [name] is a 89 y.o. female with medical history significant of HTN, HLD, HFpEF, A-fib on Pradaxa , COPD, bronchiectasis, GERD, breast cancer s/p of a left lumpectomy), Psoriatec arthritis, mild pulmonary hypertension, recent right hip fracture, who presents with weakness.     Patient was recently hospitalized from 6/11 - 6/16 due to mechanical fall and right hip fracture.  Patient was s/p of right hip surgery.  Patient states that her surgical site has been healing well.  In the past several days she has been feeling generalized weak, with poor appetite and decreased oral intake, cannot ambulate without assistance due to weakness.  No fall.  She has nausea, no vomiting, diarrhea or abdominal pain.  No chest pain, cough, SOB.  Denies symptoms of UTI.  No rectal bleeding or dark stool.  Per her husband, patient's heart rate was at 40s when he checked it for her at home.     Pt was found to have new complete heart block on EKG with heart rate in 40s in ED. Pt is taking amiodarone  200 mg daily and metoprolol  37.5 mg twice daily for A-fib currently.  DG Chest 7/8: Persistent but improving left basilar atelectasis or infiltrate.     No visible  effusions.    Assessment / Plan / Recommendation  Clinical Impression  Pt seen for bedside swallow evaluation in the setting of decreased oral intake/persistent dry heaving. Chart review revealing hx of GI concerns of similar nature Chronic nausea -uncertain etiology,gastritis,pud,gastric dysmotility. Per GI note on 12/2021, with recommendations for medical management. Family endorsing current symptoms (dry heaving)  appears acutely worse, but GI concerns (dry heaving, globus sensation, reduced appetite) have persisted for some time.  Pt is s/p reglan  x2 today, which seems to aid acceptance/ingestion of thin liquid x4 via straw. No overt or subtle s/sx pharyngeal dysphagia noted. No change to vocal quality across trials. Vitals stable for duration of trials (~98 for O2 saturation). Oral phase grossly intact for acceptance and clearance. Pt refusing all solids attempts. Education shared regarding general aspiration precautions and selection of familiar/moist food (pt previously reporting globus sensation with dry solids). Pt/family reported understanding.  Recommend continuation of unrestricted diet with self selection of soft/moist options. Increased risk for aspiration noted in the setting of acute deconditioning, GI concerns, and limited endurance for intake. Risk is managed with general aspiration precautions. Recommend consult with GI and dietician. MD and RN aware of recommendations. Given lack of evidence for oropharyngeal deficit, no further SLP services indicated at this time.    SLP Visit Diagnosis: Dysphagia, unspecified (R13.10) (? esophageal component)    Aspiration Risk  Moderate aspiration risk    Diet Recommendation    (continue with unrestricted diet)  Medication Administration: Whole meds with liquid (education shared regarding alt methods- pt refusing options)    Other  Recommendations Recommended Consults: Consider GI evaluation Oral Care Recommendations: Oral care BID     Assistance Recommended at Discharge    Functional Status Assessment Patient has not had a recent decline in their functional status    Swallow Study   General Date of Onset: 09/13/23 HPI: Per H&P, [name] is a 89 y.o. female with medical history significant of HTN, HLD, HFpEF, A-fib on Pradaxa , COPD, bronchiectasis, GERD, breast cancer s/p of a left lumpectomy), Psoriatec arthritis, mild pulmonary  hypertension, recent right hip fracture, who presents with weakness.     Patient was recently hospitalized from 6/11 - 6/16 due to mechanical fall and right hip fracture.  Patient was s/p of right hip surgery.  Patient states that her surgical site has been healing well.  In the past several days she has been feeling generalized weak, with poor appetite and decreased oral intake, cannot ambulate without assistance due to weakness.  No fall.  She has nausea, no vomiting, diarrhea or abdominal pain.  No chest pain, cough, SOB.  Denies symptoms of UTI.  No rectal bleeding or dark stool.  Per her husband, patient's heart rate was at 40s when he checked it for her at home.     Pt was found to have new complete heart block on EKG with heart rate in 40s in ED. Pt is taking amiodarone  200 mg daily and metoprolol  37.5 mg twice daily for A-fib currently.  DG Chest 7/8: Persistent but improving left basilar atelectasis or infiltrate.     No visible effusions. Type of Study: Bedside Swallow Evaluation Previous Swallow Assessment: none in chart Diet Prior to this Study: Regular;Thin liquids (Level 0) Temperature Spikes Noted: No (WBC elevated 21.5; temp 99.3) Respiratory Status: Nasal cannula (2L) History of Recent Intubation: No Behavior/Cognition: Lethargic/Drowsy;Requires cueing Oral Cavity Assessment: Within Functional Limits Oral Care Completed by SLP: Recent completion by staff Self-Feeding Abilities: Needs set  up Patient Positioning: Upright in bed Baseline Vocal Quality: Low vocal intensity (minimal vocalizations)    Oral/Motor/Sensory Function Overall Oral Motor/Sensory Function: Within functional limits   Ice Chips Ice chips: Not tested   Thin Liquid Thin Liquid: Within functional limits Presentation: Straw    Nectar Thick Nectar Thick Liquid: Not tested   Honey Thick Honey Thick Liquid: Not tested   Puree Puree:  (refused)   Solid     Solid: Not tested (refused)     Olivia Betzaida Cremeens Clapp,  MS, CCC-SLP Speech Language Pathologist Rehab Services; [facility] [phone] (ascom)   [name] 09/13/2023,3:37 PM

## 2023-09-13 NOTE — TOC Initial Note (Signed)
 Transition of Care (TOC) - Initial/Assessment Note   ED SW met with pt at bedside in ED. Pt husband Sheppard [place]) and daughter Lessie Barge) were present as well.  Pt gave verbal permission for ED SW to speak with family.  Per family report:  Pt lives with husband.  No safety concerns at this time.  PCP: Dr. Cleotilde and Pharmacy: [facility].  HH was scheduled to see pt today (name unknown) but pt is now in ED. DME: walker, wheelchair, raised commode.  Husband drives and will provide transportation once discharges if appropriate. Support: husband and daughter.  No SDOH needs identified at this time.  Husband expressed that pt recently had issues with swallowing medications and keeping things down and nausea. No incontinence issues reported.  Pt was in rehab recently for broken hip.  Husband stated that pt was so weak yesterday that he couldn't help her get up.  SW will continue to monitor and follow up with pt as needed while in ED.    Patient Details  Name: [name] MRN: [id number] Date of Birth: 08-04-1933  Transition of Care [facility]) CM/SW Contact:    Edsel DELENA Fischer, LCSW Phone Number: 09/13/2023, 4:58 PM  Clinical Narrative:                     Barriers to Discharge: Continued Medical Work up   Patient Goals and CMS Choice            Expected Discharge Plan and Services In-house Referral: Clinical Social Work     Living arrangements for the past 2 months: Apartment (lives in [place])                   DME Agency: Other - Comment (Unknown)                  Prior Living Arrangements/Services Living arrangements for the past 2 months: Apartment (lives in [place]) Lives with:: Spouse Patient language and need for interpreter reviewed:: Yes Do you feel safe going back to the place where you live?: Yes      Need for Family Participation in Patient Care: Yes (Comment) Care giver support system in place?: Yes (comment) Current home services: DME (walker, wheelchair,  raised commode)    Activities of Daily Living      Permission Sought/Granted                  Emotional Assessment Appearance:: Appears stated age Attitude/Demeanor/Rapport: Lethargic Affect (typically observed): Quiet     Psych Involvement: No (comment)  Admission diagnosis:  Complete heart block (HCC) [I44.2] Symptomatic bradycardia [R00.1] Patient Active Problem List   Diagnosis Date Noted   Symptomatic bradycardia 09/13/2023   Complete heart block (HCC) 09/12/2023   Myocardial injury 09/12/2023   Atrial fibrillation, chronic (HCC) 09/12/2023   Closed fracture of right femur, unspecified fracture morphology, sequela 08/17/2023   Hip pain, acute, right 08/16/2023   Acute on chronic heart failure with preserved ejection fraction (HFpEF) (HCC) 07/19/2023   Hypokalemia 07/19/2023   Atrial fibrillation (HCC) 07/18/2023   Palpitations 05/18/2023   Leukocytosis 05/18/2023   Demand ischemia (HCC) 05/18/2023   (HFpEF) heart failure with preserved ejection fraction (HCC) 05/18/2023   Psoriatic arthritis (HCC) 05/18/2023   Chronic respiratory failure with hypoxia (HCC) 06/29/2022   Chronic nausea 12/09/2020   Closed intracapsular fracture of left femur with routine healing 08/28/2020   Acquired thrombophilia (HCC) 07/15/2020   Paroxysmal atrial fibrillation with RVR (HCC) 06/19/2020  Atrial fibrillation with rapid ventricular response (HCC) 06/09/2020   Fracture of femoral neck, left, closed (HCC) 06/05/2020   Atherosclerosis of aorta (HCC) 01/09/2020   Chronic obstructive pulmonary disease (HCC) 01/09/2020   HSV (herpes simplex virus) infection 01/09/2020   BCC (basal cell carcinoma), face 10/12/2017   Environmental and seasonal allergies 10/30/2014   Psoriasis vulgaris 10/30/2014   Primary hypertension 10/30/2014   Gastro-esophageal reflux disease without esophagitis 10/30/2014   Hot flash, menopausal 10/30/2014   Hyperlipidemia, mixed 10/30/2014   History of  cancer of left breast 10/30/2014   Bronchiectasis (HCC) 09/24/2013   PCP:  Cleotilde Oneil FALCON, MD Pharmacy:   [facility] [phone] - [place], [place] - [address] AT [facility] [address] [place] [address] Phone: [phone] Fax: [phone]  [facility] [address] [place] [address] Phone: [phone] Fax: [phone]     Social Drivers of Health (SDOH) Social History: SDOH Screenings   Food Insecurity: Patient Declined (08/16/2023)  Housing: Patient Declined (08/16/2023)  Transportation Needs: Patient Declined (08/16/2023)  Utilities: Patient Declined (08/16/2023)  Alcohol Screen: Low Risk  (02/17/2021)  Depression (PHQ2-9): Medium Risk (11/05/2021)  Financial Resource Strain: Low Risk  (08/10/2023)   Received from [facility]  Physical Activity: Sufficiently Active (02/17/2021)  Social Connections: Patient Declined (08/16/2023)  Recent Concern: Social Connections - Moderately Isolated (07/18/2023)  Stress: No Stress Concern Present (02/17/2021)  Tobacco Use: Low Risk  (08/16/2023)  Recent Concern: Tobacco Use - Medium Risk (08/10/2023)   Received from [facility]   SDOH Interventions:     Readmission Risk Interventions     No data to display

## 2023-09-13 NOTE — ED Notes (Signed)
Report given to Alaina RN

## 2023-09-13 NOTE — ED Notes (Signed)
 Pt attempted to take 1 half of a potassium pill and then refused all other PO medication.  Sts she cannot take anything PO w/o dry heaving.  Reglan  had been given.  Will inform Hospitalist.

## 2023-09-13 NOTE — Consult Note (Signed)
 Consultation Note Date: 09/13/2023 at 1100   Patient Name: [name]  DOB: 1933/05/02  MRN: [id number]  Age / Sex: 89 y.o., female  PCP: Cleotilde Oneil FALCON, MD Referring Physician: Jhonny Calvin NOVAK, MD  HPI/Patient Profile: 89 y.o. female  with past medical history of HTN, HLD, HFpEF, A-fib on Pradaxa , COPD, bronchiectasis, GERD, breast cancer s/p of a left lumpectomy), Psoriatec arthritis, mild pulmonary hypertension, recent right hip fracture  admitted on 09/12/2023 with weakness and poor PO intake.  Patient is being treated for complete heart block with symptomatic bradycardia, A-fib, bronchiectasis, and COPD.   Cardiology and SLP have been consulted in addition to PMT to support patient with goals of care discussions.  Clinical Assessment and Goals of Care: Extensive chart review completed prior to meeting patient including labs, vital signs, imaging, progress notes, orders, and available advanced directive documents from current and previous encounters. I then met with patient, her husband [name] and her daughter Arland at bedside in the ED to discuss diagnosis prognosis, GOC, EOL wishes, disposition and options.  I introduced Palliative Medicine as specialized medical care for people living with serious illness. It focuses on providing relief from the symptoms and stress of a serious illness. The goal is to improve quality of life for both the patient and the family.  We discussed a brief life review of the patient.  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  She has 1 son and 1 daughter.  She has been married to [place] for 43 years.  In retirement, she enjoyed golfing up until just a few months ago when her poor p.o. intake and low energy began.  As far as functional and nutritional status patient's husband endorses patient can take small sips of tea without dry heaving or appearing to want  to throw up.  He shares that she hardly eats anything for fear that she will vomit.  Up until a few weeks ago, patient was able to ambulate with walker assistance.  However, over the last few weeks she has become so weak that she cannot get herself out of bed.  Husband endorses patient has had some dry heaving and inability to pass p.o. for some time now.  On chart review,GI workup in October 2023 revealed:  -nml CT scan abdo/pelvis -fatty liver on u/s -nml ugi xray 2022 -nml mesenteric doppler -neg H pylori via breath test -Prev report of reglan ,creon w/o benefit. Zofran  not helpful but phenergan ,dramamine helpful -Was switched from xarelto  to pradaxa  to xarelto  w/o change in sx.  -Recent switch from famotidine  10mg  per day(possibly due to renal insuff) to omeprazole  40mg  bid and reglan  5mg  w significant improvement in sx.   I attempted to elicit values and goals of care important to the patient.  Patient states that she would never want to be kept alive on machines.  However, when daughter pressed if patient was sure that this was her final answer, patient backed off from giving a definitive yes/no.  Detailed discussion of full code versus DNR/DNI  status held.  Patient still is undecided and therefore full code remains.  Patient's husband asked about a feeding tube.  Risk and benefits of feeding tube as well as prognostic indicators of feeding tube discussed.  Patient again voiced that she would never want a feeding tube.  Discussed that a feeding tube is not currently recommended but that we will discuss in further detail should that become a recommendation.  Discussed temporary pacer stickers on patient's chest as well as per minute pacemaker.  Plan is to not place a pacemaker until at least 3 to 4 days have passed for metoprolol /amiodarone  to be excreted.  Ideally, patient's detox from metoprolol /amiodarone  will help improve heart rate and avoid pacemaker placement.  When asked if she would be  accepting of the pacemaker, patient initially said no.  However, she again backtracked when asked if she was sure and would not give a final answer.  Advance directives, concepts specific to code status, artificial feeding and hydration, and rehospitalization were considered and discussed.  Education offered regarding concept specific to human mortality and the limitations of medical interventions to prolong life when the body begins to fail to thrive.  Family is facing treatment option decisions, advanced directive, and anticipatory care needs.     Extensive discussion of multifactorial contribution of patient's weakness and poor p.o. intake.  During my visit, cardiology joined the discussion.  Discussed importance of stopping metoprolol  and amiodarone  to see if this helps with bradycardia.  Additionally discussed poor p.o. intake related to a separate issue/not related to cardiac issue.  Space and opportunity provided for patient and family to ask questions and voiced concerns both with PMT and cardiology present.  Counseled with attending to discuss patient's dry heaving/inability to take PO.  SLP evaluated and found no oropharyngeal abnormalities or high risk of aspiration.    Recommended cortisol a.m. level to be drawn to rule out adrenal insufficiency.  Also recommended GI consult to explore other causes of patient's immediate nausea and gagging/heaving after p.o. intake.  Discussed with patient/family the importance of continued conversation with family and the medical providers regarding overall plan of care and treatment options, ensuring decisions are within the context of the patient's values and GOCs.  No adjustment to plan of care at this time remains.  PT will continue to follow and support patient and family with goals of care discussions.  Primary Decision Maker PATIENT  Physical Exam Vitals reviewed.  Constitutional:      Appearance: She is normal weight.  HENT:      Mouth/Throat:     Mouth: Mucous membranes are moist.  Eyes:     Pupils: Pupils are equal, round, and reactive to light.  Pulmonary:     Effort: Pulmonary effort is normal.  Abdominal:     Palpations: Abdomen is soft.  Skin:    General: Skin is warm and dry.     Coloration: Skin is pale.  Neurological:     Mental Status: She is alert and oriented to person, place, and time.  Psychiatric:        Mood and Affect: Mood normal.        Behavior: Behavior normal.        Thought Content: Thought content normal.        Judgment: Judgment normal.     Palliative Assessment/Data: 40-50%     Thank you for this consult. Palliative medicine will continue to follow and assist holistically.   Time Total: 150 minutes  Time spent  includes: Detailed review of medical records (labs, imaging, vital signs), medically appropriate exam (mental status, respiratory, cardiac, skin), discussed with treatment team, counseling and educating patient, family and staff, documenting clinical information, medication management and coordination of care.  Signed by: Lamarr Gunner, DNP, FNP-BC Palliative Medicine   Please contact Palliative Medicine Team providers via [facility] for questions and concerns.

## 2023-09-13 NOTE — Consult Note (Signed)
 PHARMACY - ANTICOAGULATION CONSULT NOTE  Pharmacy Consult for Heparin   Indication: atrial fibrillation  Allergies  Allergen Reactions   Amoxicillin  Nausea Only   Eliquis  [Apixaban ] Nausea Only    Patient Measurements: Height: 5' 2 (157.5 cm) Weight: 58.9 kg (129 lb 12.8 oz) IBW/kg (Calculated) : 50.1 HEPARIN  DW (KG): 58.9 Heparin  weight 58.9 kg   Vital Signs: Temp: 98.6 F (37 C) (07/09 0949) Temp Source: Oral (07/09 0949) BP: 139/46 (07/09 0930) Pulse Rate: 44 (07/09 0930)  Labs: Recent Labs    09/12/23 1745 09/12/23 1856 09/12/23 2322 09/13/23 0103 09/13/23 0353  HGB 14.1  --   --   --  12.2  HCT 41.7  --   --   --  37.1  PLT 273  --   --   --  230  APTT  --   --  51*  --   --   LABPROT  --   --  17.0*  --   --   INR  --   --  1.3*  --   --   HEPARINUNFRC  --   --   --   --  0.20*  CREATININE 0.64  --   --   --  0.51  TROPONINIHS 21* 20* 16 22*  --     Estimated Creatinine Clearance: 39.2 mL/min (by C-G formula based on SCr of 0.51 mg/dL).   Medical History: Past Medical History:  Diagnosis Date   Acquired thrombophilia (HCC)    Acute metabolic encephalopathy 06/19/2020   Allergy    Atherosclerosis of aorta (HCC)    Atrial fibrillation (HCC)    Bronchiectasis (HCC)    Cancer (HCC)    left breast cancer    Chronic nausea    COPD (chronic obstructive pulmonary disease) (HCC)    GERD (gastroesophageal reflux disease)    Grade II diastolic dysfunction    Hyperlipemia    Hypertension    Intractable nausea and vomiting 06/20/2018   Mild concentric left ventricular hypertrophy (LVH)    Mild mitral stenosis by prior echocardiogram    Mild pulmonary hypertension (HCC)    Psoriatic arthritis (HCC)    Stage 2 moderate COPD by GOLD classification (HCC)     Medications:  (Not in a hospital admission)  Scheduled:   budesonide -glycopyrrolate -formoterol   2 puff Inhalation BID   folic acid   1 mg Oral Daily   hydrochlorothiazide   25 mg Oral Daily    LORazepam   0.5 mg Oral QHS   metoCLOPramide  (REGLAN ) injection  10 mg Intravenous Q8H   mirtazapine   15 mg Oral QHS   pantoprazole   40 mg Oral Daily   potassium chloride   40 mEq Oral Q2H   Infusions:   heparin  950 Units/hr (09/13/23 0438)   PRN: acetaminophen , albuterol , atropine , dextromethorphan-guaiFENesin , diphenhydrAMINE , docusate sodium , hydrALAZINE , HYDROcodone -acetaminophen , ondansetron  (ZOFRAN ) IV Anti-infectives (From admission, onward)    None       Assessment: 89 y.o. female with history of HLD, HFpEF, A-fib on Pradaxa  presenting today for weakness. She had a hip surgery about 1 month ago putting her at an increase risk for clots. Patient has complete heart block now. CHADSVASc 5.  Pharmacy consulted to start heparin  for afib. CBC stable.   Goal of Therapy:  anti-Xa level 0.3-0.7 units/ml Monitor platelets by anticoagulation protocol: Yes   Plan: anti-Xa level therapeutic x 1 ---continue heparin  infusion at 950 units/hr ---Recheck anti-Xa level in 8 hours to confirm ---Continue to monitor H&H and platelets  Adriana Bolster, PharmD, BCPS 09/13/2023  10:07 AM

## 2023-09-13 NOTE — ED Notes (Signed)
 Hospitalist on call, Cleatus MD, made aware pt bladder scan showed urine retention. Pt states unable to void and no urge to void at this time.

## 2023-09-13 NOTE — Consult Note (Signed)
 PHARMACY - ANTICOAGULATION CONSULT NOTE  Pharmacy Consult for Heparin   Indication: atrial fibrillation  Allergies  Allergen Reactions   Amoxicillin  Nausea Only   Eliquis  [Apixaban ] Nausea Only    Patient Measurements: Height: 5' 2 (157.5 cm) Weight: 58.9 kg (129 lb 12.8 oz) IBW/kg (Calculated) : 50.1 HEPARIN  DW (KG): 58.9 Heparin  weight 58.9 kg   Vital Signs: Temp: 98.7 F (37.1 C) (07/08 2321) Temp Source: Oral (07/08 2321) BP: 141/41 (07/09 0330) Pulse Rate: 47 (07/09 0330)  Labs: Recent Labs    09/12/23 1745 09/12/23 1856 09/12/23 2322 09/13/23 0103 09/13/23 0353  HGB 14.1  --   --   --  12.2  HCT 41.7  --   --   --  37.1  PLT 273  --   --   --  230  APTT  --   --  51*  --   --   LABPROT  --   --  17.0*  --   --   INR  --   --  1.3*  --   --   HEPARINUNFRC  --   --   --   --  0.20*  CREATININE 0.64  --   --   --   --   TROPONINIHS 21* 20* 16 22*  --     Estimated Creatinine Clearance: 39.2 mL/min (by C-G formula based on SCr of 0.64 mg/dL).   Medical History: Past Medical History:  Diagnosis Date   Acquired thrombophilia (HCC)    Acute metabolic encephalopathy 06/19/2020   Allergy    Atherosclerosis of aorta (HCC)    Atrial fibrillation (HCC)    Bronchiectasis (HCC)    Cancer (HCC)    left breast cancer    Chronic nausea    COPD (chronic obstructive pulmonary disease) (HCC)    GERD (gastroesophageal reflux disease)    Grade II diastolic dysfunction    Hyperlipemia    Hypertension    Intractable nausea and vomiting 06/20/2018   Mild concentric left ventricular hypertrophy (LVH)    Mild mitral stenosis by prior echocardiogram    Mild pulmonary hypertension (HCC)    Psoriatic arthritis (HCC)    Stage 2 moderate COPD by GOLD classification (HCC)     Medications:  (Not in a hospital admission)  Scheduled:   budesonide -glycopyrrolate -formoterol   2 puff Inhalation BID   folic acid   1 mg Oral Daily   hydrochlorothiazide   25 mg Oral Daily    LORazepam   0.5 mg Oral QHS   pantoprazole   40 mg Oral Daily   Infusions:   heparin  800 Units/hr (09/12/23 1957)   PRN: acetaminophen , albuterol , atropine , dextromethorphan-guaiFENesin , diphenhydrAMINE , docusate sodium , hydrALAZINE , HYDROcodone -acetaminophen  Anti-infectives (From admission, onward)    None       Assessment: 89 y.o. female with history of HLD, HFpEF, A-fib on Pradaxa  presenting today for weakness. She had a hip surgery about 1 month ago putting her at an increase risk for clots. Patient has complete heart block now. CHADSVASc 5.  Pharmacy consulted to start heparin  for afib. CBC stable.   Goal of Therapy:  Heparin  level 0.3-0.7 units/ml Monitor platelets by anticoagulation protocol: Yes  7/09 0353 HL 0.20, subtherapeutic   Plan:  Bolus 900 units x 1 Increase heparin  infusion to 950 units/hr Recheck HL in 8 hours after rate change Continue to monitor H&H and platelets  Rankin CANDIE Dills, PharmD, [facility] 09/13/2023 4:27 AM

## 2023-09-13 NOTE — ED Notes (Signed)
 Cleatus MD made aware K 2.8

## 2023-09-13 NOTE — ED Notes (Signed)
 Pt took sips of ENSURE x4.  Pt refused apple sauce and anything else.

## 2023-09-13 NOTE — ED Notes (Signed)
 Arm board placed on Pt for heparin  infusion. Site flushed, clean, dry and intact. Blood return noted.

## 2023-09-13 NOTE — ED Notes (Signed)
 Speech Therapy at bedside

## 2023-09-13 NOTE — ED Notes (Signed)
 Pt placed on bedpan and given instructions to use call light when done.

## 2023-09-14 ENCOUNTER — Encounter: Payer: Self-pay | Admitting: Internal Medicine

## 2023-09-14 DIAGNOSIS — R638 Other symptoms and signs concerning food and fluid intake: Secondary | ICD-10-CM

## 2023-09-14 DIAGNOSIS — J449 Chronic obstructive pulmonary disease, unspecified: Secondary | ICD-10-CM | POA: Diagnosis not present

## 2023-09-14 DIAGNOSIS — R630 Anorexia: Secondary | ICD-10-CM

## 2023-09-14 DIAGNOSIS — I5030 Unspecified diastolic (congestive) heart failure: Secondary | ICD-10-CM | POA: Diagnosis not present

## 2023-09-14 DIAGNOSIS — E440 Moderate protein-calorie malnutrition: Secondary | ICD-10-CM | POA: Insufficient documentation

## 2023-09-14 DIAGNOSIS — R112 Nausea with vomiting, unspecified: Secondary | ICD-10-CM

## 2023-09-14 DIAGNOSIS — J479 Bronchiectasis, uncomplicated: Secondary | ICD-10-CM | POA: Diagnosis not present

## 2023-09-14 DIAGNOSIS — I442 Atrioventricular block, complete: Secondary | ICD-10-CM | POA: Diagnosis not present

## 2023-09-14 LAB — BASIC METABOLIC PANEL WITH GFR
Anion gap: 12 (ref 5–15)
BUN: 30 mg/dL — ABNORMAL HIGH (ref 8–23)
CO2: 23 mmol/L (ref 22–32)
Calcium: 8.3 mg/dL — ABNORMAL LOW (ref 8.9–10.3)
Chloride: 102 mmol/L (ref 98–111)
Creatinine, Ser: 0.47 mg/dL (ref 0.44–1.00)
GFR, Estimated: >60 mL/min (ref >60)
Glucose, Bld: 124 mg/dL — ABNORMAL HIGH (ref 70–99)
Potassium: 3.7 mmol/L (ref 3.5–5.1)
Sodium: 137 mmol/L (ref 135–145)

## 2023-09-14 LAB — CBC
HCT: 35.2 % — ABNORMAL LOW (ref 36.0–46.0)
Hemoglobin: 11.6 g/dL — ABNORMAL LOW (ref 12.0–15.0)
MCH: 29.3 pg (ref 26.0–34.0)
MCHC: 33 g/dL (ref 30.0–36.0)
MCV: 88.9 fL (ref 80.0–100.0)
Platelets: 229 K/uL (ref 150–400)
RBC: 3.96 MIL/uL (ref 3.87–5.11)
RDW: 15.3 % (ref 11.5–15.5)
WBC: 17.4 K/uL — ABNORMAL HIGH (ref 4.0–10.5)
nRBC: 0 % (ref 0.0–0.2)

## 2023-09-14 LAB — HEPARIN LEVEL (UNFRACTIONATED)
Heparin Unfractionated: >1.1 [IU]/mL — ABNORMAL HIGH (ref 0.30–0.70)
Heparin Unfractionated: 0.42 [IU]/mL (ref 0.30–0.70)

## 2023-09-14 LAB — CORTISOL-AM, BLOOD: Cortisol - AM: 11 ug/dL (ref 6.7–22.6)

## 2023-09-14 LAB — MAGNESIUM: Magnesium: 1.8 mg/dL (ref 1.7–2.4)

## 2023-09-14 MED ORDER — ADULT MULTIVITAMIN W/MINERALS CH
1.0000 | ORAL_TABLET | Freq: Every day | ORAL | Status: DC
  Administered 2023-09-14 – 2023-09-21 (×8): 1 via ORAL
  Filled 2023-09-14 (×8): qty 1

## 2023-09-14 MED ORDER — ENSURE PLUS HIGH PROTEIN PO LIQD
237.0000 mL | Freq: Two times a day (BID) | ORAL | Status: DC
  Administered 2023-09-14 – 2023-09-21 (×8): 237 mL via ORAL

## 2023-09-14 MED ORDER — MAGNESIUM SULFATE IN D5W 1-5 GM/100ML-% IV SOLN
1.0000 g | Freq: Once | INTRAVENOUS | Status: AC
  Administered 2023-09-14: 1 g via INTRAVENOUS
  Filled 2023-09-14: qty 100

## 2023-09-14 MED ORDER — POTASSIUM CHLORIDE CRYS ER 20 MEQ PO TBCR
40.0000 meq | EXTENDED_RELEASE_TABLET | Freq: Once | ORAL | Status: AC
  Administered 2023-09-14: 40 meq via ORAL
  Filled 2023-09-14: qty 2

## 2023-09-14 NOTE — Progress Notes (Signed)
 Initial Nutrition Assessment  DOCUMENTATION CODES:   Non-severe (moderate) malnutrition in context of chronic illness  INTERVENTION:   -Liberalize diet to 2 gram sodium for wider variety of meal selections -Ensure Plus High Protein po BID, each supplement provides 350 kcal and 20 grams of protein  -MVI with minerals daily  NUTRITION DIAGNOSIS:   Moderate Malnutrition related to chronic illness (CHF) as evidenced by mild fat depletion, moderate fat depletion, mild muscle depletion, moderate muscle depletion.  GOAL:   Patient will meet greater than or equal to 90% of their needs  MONITOR:   PO intake, Supplement acceptance  REASON FOR ASSESSMENT:   Consult Assessment of nutrition requirement/status  ASSESSMENT:   Pt with medical history significant of HTN, HLD, HFpEF, A-fib on Pradaxa , COPD, bronchiectasis, GERD, breast cancer s/p of a left lumpectomy), Psoriatec arthritis, mild pulmonary hypertension, recent right hip fracture, who presents with weakness.  Pt admitted with complete heart block.   Reviewed I/O's: +250 ml x 24 hours  Pt asleep at time of visit, but arouse to voice. Pt with flat affect and did not engage much in conversation. She did no provide much history. No family present to provide additional history.   Pt currently on a heart healthy diet. Noted meal completions 90%.   No wt loss noted over the past 4 months.   Per TOC notes, pt was at [facility] from 6/16-09/07/23 and was discharged home with home health services [facility]).   Palliative care consult pending for goals of care.   Case discussed with RN.   Medications reviewed and include folic acid , reglan , remeron ,and protonix .   Labs reviewed.    NUTRITION - FOCUSED PHYSICAL EXAM:  Flowsheet Row Most Recent Value  Orbital Region Moderate depletion  Upper Arm Region Moderate depletion  Thoracic and Lumbar Region No depletion  Buccal Region No depletion  Temple Region Moderate  depletion  Clavicle Bone Region Mild depletion  Clavicle and Acromion Bone Region Mild depletion  Scapular Bone Region Mild depletion  Dorsal Hand Mild depletion  Patellar Region Moderate depletion  Anterior Thigh Region Moderate depletion  Posterior Calf Region Moderate depletion  Edema (RD Assessment) None  Hair Reviewed  Eyes Reviewed  Mouth Reviewed  Skin Reviewed  Nails Reviewed    Diet Order:   Diet Order             Diet 2 gram sodium Fluid consistency: Thin  Diet effective now                   EDUCATION NEEDS:   No education needs have been identified at this time  Skin:  Skin Assessment: Skin Integrity Issues: Skin Integrity Issues:: Incisions Incisions: closed rt hip  Last BM:  09/11/23  Height:   Ht Readings from Last 1 Encounters:  09/12/23 5' 2 (1.575 m)    Weight:   Wt Readings from Last 1 Encounters:  09/14/23 62.2 kg    Ideal Body Weight:  50 kg  BMI:  Body mass index is 25.08 kg/m.  Estimated Nutritional Needs:   Kcal:  1500-1700  Protein:  75-90 grams  Fluid:  1.5-1.7 L    Margery ORN, RD, LDN, CDCES Registered Dietitian III Certified Diabetes Care and Education Specialist If unable to reach this RD, please use RD Inpatient group chat on secure chat between hours of 8am-4 pm daily

## 2023-09-14 NOTE — Progress Notes (Signed)
 PROGRESS NOTE    [name]  FMW:[id number] DOB: [birth year] DOA: 09/12/2023 PCP: Cleotilde Oneil FALCON, MD    Brief Narrative:  89 y.o. female with medical history significant of HTN, HLD, HFpEF, A-fib on Pradaxa , COPD, bronchiectasis, GERD, breast cancer s/p of a left lumpectomy), Psoriatec arthritis, mild pulmonary hypertension, recent right hip fracture, who presents with weakness.   Patient was recently hospitalized from 6/11 - 6/16 due to mechanical fall and right hip fracture.  Patient was s/p of right hip surgery.  Patient states that her surgical site has been healing well.  In the past several days she has been feeling generalized weak, with poor appetite and decreased oral intake, cannot ambulate without assistance due to weakness.  No fall.  She has nausea, no vomiting, diarrhea or abdominal pain.  No chest pain, cough, SOB.  Denies symptoms of UTI.  No rectal bleeding or dark stool.  Per her husband, patient's heart rate was at 40s when he checked it for her at home.   Pt was found to have new complete heart block on EKG with heart rate in 40s in ED. Pt is taking amiodarone  200 mg daily and metoprolol  37.5 mg twice daily for A-fib currently.    Assessment & Plan:   Principal Problem:   Complete heart block (HCC) Active Problems:   Atrial fibrillation, chronic (HCC)   Myocardial injury   (HFpEF) heart failure with preserved ejection fraction (HCC)   Primary hypertension   Leukocytosis   Bronchiectasis (HCC)   Chronic obstructive pulmonary disease (HCC)   Psoriasis vulgaris   Psoriatic arthritis (HCC)   Symptomatic bradycardia   Malnutrition of moderate degree   Anorexia   Nausea and vomiting  High degree heart block Symptomatic bradycardia Currently hemodynamically stable.  No chest pain or shortness of breath.  Heart rate 40.  EDP consulted with cardiology who recommend holding metoprolol  and amiodarone . Plan: Continue telemetry Continue holding amiodarone  and  metoprolol  Cardiology following Currently no plans for pacemaker  Chronic A-fib: Currently bradycardic.   Amiodarone  metoprolol  on hold Telemetry as above   Myocardial injury:  trop 21.  Patient does not have chest pain or SOB.   Likely demand ischemia. Continue telemetry  (HFpEF) heart failure with preserved ejection fraction (HCC):  2D echo on 05/19/2023 showed EF 60 to 65% with grade 2 diastolic dysfunction.  Patient does not have leg edema or JVD.  No SOB.  CHF seems to be compensated. - Diuretic   Primary hypertension Discontinue hydrochlorothiazide .  IV hydralazine  as needed.  Continue metoprolol    Leukocytosis:  Appears to be WBC 23.2, no source infection identified so far.   Appears to be chronic issue .  No indication of infection.  Continue to hold antibiotics.   Bronchiectasis (HCC) and Chronic obstructive pulmonary disease (HCC):  stable.  No wheezing, no SOB. - Continue bronchodilators and as needed Mucinex    Hx of psoriasis vulgaris and psoriatic arthritis (HCC): - Patient is on weekly methotrexate  (every Sunday).  Defer to outpatient  Dry heaving Poor p.o. intake Intractable nausea Again appears to be chronic issue for patient.  Previously responded to Reglan  and Protonix .  No longer responding.  Could be medication related.  Oral intake seems to be improving after discontinuation of metoprolol  and amiodarone .  GI consulted consideration for endoscopy.    DVT prophylaxis: Heparin  Code Status: Full Family Communication: Husband at bedside 7/9, 7/10.  Daughter at bedside 7/10 Disposition Plan: Status is: Inpatient Remains inpatient appropriate because: Multiple acute issues  as above   Level of care: Telemetry Cardiac  Consultants:  Cardiology  Procedures:  None  Antimicrobials: None   Subjective: Seen and examined.  Worsening in bed.  Energy level remains low but seems to be eating more.  Objective: Vitals:   09/14/23 0409 09/14/23 0500  09/14/23 0721 09/14/23 1145  BP: (!) 131/44  (!) 156/45 (!) 164/50  Pulse: (!) 43  (!) 46 (!) 48  Resp: 20  16 16   Temp: 98.9 F (37.2 C)  97.7 F (36.5 C) 98.6 F (37 C)  TempSrc:   Oral   SpO2: 95%  95% 94%  Weight:  62.2 kg    Height:        Intake/Output Summary (Last 24 hours) at 09/14/2023 1427 Last data filed at 09/14/2023 1022 Gross per 24 hour  Intake 469.58 ml  Output --  Net 469.58 ml   Filed Weights   09/12/23 1936 09/13/23 2300 09/14/23 0500  Weight: 58.9 kg 62.4 kg 62.2 kg    Examination:  General exam: No acute distress.  Appears fatigued Respiratory system: Shallow respirations.  Lungs clear.  Normal work of breathing.  Room air Cardiovascular system: S1-S2, bradycardic, irregular rhythm, no murmurs, no pedal edema Gastrointestinal system: Soft, NT/ND, normal bowel sounds Central nervous system: Alert and oriented. No focal neurological deficits. Extremities: Decreased power bilateral lower extremities Skin: No rashes, lesions or ulcers Psychiatry: Judgement and insight appear impaired. Mood & affect depressed.     Data Reviewed: I have personally reviewed following labs and imaging studies  CBC: Recent Labs  Lab 09/12/23 1745 09/13/23 0353 09/14/23 0343  WBC 23.2* 21.5* 17.4*  NEUTROABS 19.9*  --   --   HGB 14.1 12.2 11.6*  HCT 41.7 37.1 35.2*  MCV 87.8 89.4 88.9  PLT 273 230 229   Basic Metabolic Panel: Recent Labs  Lab 09/12/23 1745 09/13/23 0353 09/14/23 0343  NA 135 136 137  K 3.5 2.8* 3.7  CL 98 99 102  CO2 24 23 23   GLUCOSE 120* 92 124*  BUN 22 19 30*  CREATININE 0.64 0.51 0.47  CALCIUM  8.9 7.9* 8.3*  MG 1.9  --  1.8   GFR: Estimated Creatinine Clearance: 42.9 mL/min (by C-G formula based on SCr of 0.47 mg/dL). Liver Function Tests: Recent Labs  Lab 09/12/23 1745  AST 20  ALT 12  ALKPHOS 87  BILITOT 1.0  PROT 7.2  ALBUMIN 3.3*   No results for input(s): LIPASE, AMYLASE in the last 168 hours. No results for  input(s): AMMONIA in the last 168 hours. Coagulation Profile: Recent Labs  Lab 09/12/23 2322  INR 1.3*   Cardiac Enzymes: No results for input(s): CKTOTAL, CKMB, CKMBINDEX, TROPONINI in the last 168 hours. BNP (last 3 results) No results for input(s): PROBNP in the last 8760 hours. HbA1C: No results for input(s): HGBA1C in the last 72 hours. CBG: No results for input(s): GLUCAP in the last 168 hours. Lipid Profile: No results for input(s): CHOL, HDL, LDLCALC, TRIG, CHOLHDL, LDLDIRECT in the last 72 hours. Thyroid  Function Tests: Recent Labs    09/13/23 0103  TSH 3.407   Anemia Panel: No results for input(s): VITAMINB12, FOLATE, FERRITIN, TIBC, IRON , RETICCTPCT in the last 72 hours. Sepsis Labs: No results for input(s): PROCALCITON, LATICACIDVEN in the last 168 hours.  No results found for this or any previous visit (from the past 240 hours).       Radiology Studies: DG Chest Portable 1 View Result Date: 09/12/2023 CLINICAL DATA:  Weakness, shortness of breath EXAM: PORTABLE CHEST 1 VIEW COMPARISON:  07/18/2023 FINDINGS: Heart and mediastinal contours within normal limits. Aortic atherosclerosis. Right lung clear. Patchy opacity in the left lower lung again noted, improved since prior study. No visible effusions or acute bony abnormality. Biapical scarring. IMPRESSION: Persistent but improving left basilar atelectasis or infiltrate. No visible effusions. Electronically Signed   By: Franky Crease M.D.   On: 09/12/2023 19:30        Scheduled Meds:  budesonide -glycopyrrolate -formoterol   2 puff Inhalation BID   feeding supplement  237 mL Oral BID BM   folic acid   1 mg Oral Daily   LORazepam   0.5 mg Oral QHS   losartan   25 mg Oral Daily   metoCLOPramide  (REGLAN ) injection  10 mg Intravenous Q8H   mirtazapine   15 mg Oral QHS   multivitamin with minerals  1 tablet Oral Daily   pantoprazole  (PROTONIX ) IV  40 mg Intravenous Q24H    Continuous Infusions:  heparin  950 Units/hr (09/13/23 2320)     LOS: 2 days      Calvin KATHEE Robson, MD [facility]   If 7PM-7AM, please contact night-coverage  09/14/2023, 2:27 PM

## 2023-09-14 NOTE — Plan of Care (Signed)

## 2023-09-14 NOTE — Consult Note (Signed)
 PHARMACY - ANTICOAGULATION CONSULT NOTE  Pharmacy Consult for Heparin   Indication: atrial fibrillation  Allergies  Allergen Reactions   Amoxicillin  Nausea Only   Eliquis  [Apixaban ] Nausea Only    Patient Measurements: Height: 5' 2 (157.5 cm) Weight: 62.2 kg (137 lb 2 oz) IBW/kg (Calculated) : 50.1 HEPARIN  DW (KG): 58.9 Heparin  weight 58.9 kg   Vital Signs: Temp: 98.9 F (37.2 C) (07/10 0409) Temp Source: Oral (07/09 2300) BP: 131/44 (07/10 0409) Pulse Rate: 43 (07/10 0409)  Labs: Recent Labs    09/12/23 1745 09/12/23 1856 09/12/23 2322 09/13/23 0103 09/13/23 0353 09/13/23 1329 09/13/23 2312 09/14/23 0343 09/14/23 0541  HGB 14.1  --   --   --  12.2  --   --  11.6*  --   HCT 41.7  --   --   --  37.1  --   --  35.2*  --   PLT 273  --   --   --  230  --   --  229  --   APTT  --   --  51*  --   --   --   --   --   --   LABPROT  --   --  17.0*  --   --   --   --   --   --   INR  --   --  1.3*  --   --   --   --   --   --   HEPARINUNFRC  --   --   --   --  0.20*   < > 0.53 >1.10* 0.42  CREATININE 0.64  --   --   --  0.51  --   --  0.47  --   TROPONINIHS 21* 20* 16 22*  --   --   --   --   --    < > = values in this interval not displayed.    Estimated Creatinine Clearance: 42.9 mL/min (by C-G formula based on SCr of 0.47 mg/dL).   Medical History: Past Medical History:  Diagnosis Date   Acquired thrombophilia (HCC)    Acute metabolic encephalopathy 06/19/2020   Allergy    Atherosclerosis of aorta (HCC)    Atrial fibrillation (HCC)    Bronchiectasis (HCC)    Cancer (HCC)    left breast cancer    Chronic nausea    COPD (chronic obstructive pulmonary disease) (HCC)    GERD (gastroesophageal reflux disease)    Grade II diastolic dysfunction    Hyperlipemia    Hypertension    Intractable nausea and vomiting 06/20/2018   Mild concentric left ventricular hypertrophy (LVH)    Mild mitral stenosis by prior echocardiogram    Mild pulmonary hypertension (HCC)     Psoriatic arthritis (HCC)    Stage 2 moderate COPD by GOLD classification (HCC)     Medications:  Medications Prior to Admission  Medication Sig Dispense Refill Last Dose/Taking   amiodarone  (PACERONE ) 200 MG tablet Take 1 tablet (200 mg total) by mouth daily. 30 tablet 0 09/12/2023 Morning   dabigatran  (PRADAXA ) 150 MG CAPS capsule Take 1 capsule (150 mg total) by mouth every 12 (twelve) hours. 60 capsule 0 09/12/2023 at  8:00 AM   docusate sodium  (COLACE) 100 MG capsule Take 1 capsule (100 mg total) by mouth 2 (two) times daily. 10 capsule 0 Past Month   Fluticasone -Umeclidin-Vilant (TRELEGY ELLIPTA) 100-62.5-25 MCG/ACT AEPB Inhale 1 puff into the lungs daily.  09/12/2023 Morning   folic acid  (FOLVITE ) 1 MG tablet Take 1 mg by mouth daily. Pt takes this med the days she does not take methotrexate    09/12/2023 Morning   hydrochlorothiazide  (HYDRODIURIL ) 25 MG tablet Take 1 tablet by mouth daily.   09/12/2023 Morning   HYDROcodone -acetaminophen  (NORCO/VICODIN) 5-325 MG tablet Take 1-2 tablets by mouth every 4 (four) hours as needed for moderate pain (pain score 4-6). 30 tablet 0 09/12/2023   LORazepam  (ATIVAN ) 0.5 MG tablet Take 1 tablet (0.5 mg total) by mouth at bedtime. 10 tablet 0 09/12/2023   methotrexate  (RHEUMATREX) 2.5 MG tablet Take 5 mg by mouth once a week. Caution:Chemotherapy. Protect from light. Pt take 2 weekly.   09/10/2023   metoCLOPramide  (REGLAN ) 10 MG tablet Take 1 tablet (10 mg total) by mouth 4 (four) times daily -  before meals and at bedtime. 120 tablet 0 09/12/2023 Morning   metoprolol  tartrate (LOPRESSOR ) 25 MG tablet Take 1.5 tablets (37.5 mg total) by mouth 2 (two) times daily. 90 tablet 0 Past Week   omeprazole  (PRILOSEC) 40 MG capsule Take 40 mg by mouth daily.   09/12/2023 Morning   ondansetron  (ZOFRAN ) 4 MG tablet Take 4 mg by mouth every 8 (eight) hours as needed for nausea or vomiting.   Unknown   [Paused] furosemide  (LASIX ) 40 MG tablet Take 1 tablet (40 mg total) by mouth  daily. 30 tablet 0    Scheduled:   budesonide -glycopyrrolate -formoterol   2 puff Inhalation BID   feeding supplement  237 mL Oral BID BM   folic acid   1 mg Oral Daily   LORazepam   0.5 mg Oral QHS   losartan   25 mg Oral Daily   metoCLOPramide  (REGLAN ) injection  10 mg Intravenous Q8H   mirtazapine   15 mg Oral QHS   pantoprazole  (PROTONIX ) IV  40 mg Intravenous Q24H   Infusions:   heparin  950 Units/hr (09/13/23 2320)   PRN: acetaminophen , albuterol , atropine , dextromethorphan-guaiFENesin , diphenhydrAMINE , docusate sodium , hydrALAZINE , HYDROcodone -acetaminophen , ondansetron  (ZOFRAN ) IV Anti-infectives (From admission, onward)    None       Assessment: 89 y.o. female with history of HLD, HFpEF, A-fib on Pradaxa  presenting today for weakness. She had a hip surgery about 1 month ago putting her at an increase risk for clots. Patient has complete heart block now. CHADSVASc 5.  Pharmacy consulted to start heparin  for afib. CBC stable.   Goal of Therapy:  anti-Xa level 0.3-0.7 units/ml Monitor platelets by anticoagulation protocol: Yes   Plan: anti-Xa level therapeutic x 3 ---continue heparin  infusion at 950 units/hr ---Recheck HL daily w/ AM labs while therapeutic ---Continue to monitor H&H and platelets  Rankin CANDIE Dills, PharmD, [facility] 09/14/2023 6:51 AM

## 2023-09-14 NOTE — TOC Progression Note (Addendum)
 Transition of Care [facility]) - Progression Note    Patient Details  Name: [name] MRN: [id number] Date of Birth: 16-Dec-1933  Transition of Care [facility]) CM/SW Contact  Lauraine JAYSON Carpen, LCSW Phone Number: 09/14/2023, 9:33 AM  Clinical Narrative:  Per chart review, patient discharged to [facility] SNF on 6/16. CSW left a voicemail for their social worker to see which home health agency she was set up with.   11:28 am: Received voicemail from SNF social worker stating that patient discharged from their facility on 7/3 and they set her up with [facility]. CSW sent secure email to liaison to see which services were ordered.    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[place])                   DME Agency: Other - Comment (Unknown)                   Social Determinants of Health (SDOH) Interventions SDOH Screenings   Food Insecurity: No Food Insecurity (09/14/2023)  Housing: Low Risk  (09/14/2023)  Transportation Needs: No Transportation Needs (09/14/2023)  Utilities: Not At Risk (09/14/2023)  Alcohol Screen: Low Risk  (02/17/2021)  Depression (PHQ2-9): Medium Risk (11/05/2021)  Financial Resource Strain: Low Risk  (08/10/2023)   Received from [facility]  Physical Activity: Sufficiently Active (02/17/2021)  Social Connections: Moderately Isolated (09/14/2023)  Stress: No Stress Concern Present (02/17/2021)  Tobacco Use: Low Risk  (09/14/2023)  Recent Concern: Tobacco Use - Medium Risk (08/10/2023)   Received from [facility]    Readmission Risk Interventions     No data to display

## 2023-09-14 NOTE — Consult Note (Signed)
 Olivia Copping, MD [facility]  [address], [address] [place], [place] [address] Phone: [phone] Fax : [phone]  Consultation  Referring Provider:     Dr. Jhonny Primary Care Physician:  Cleotilde Oneil FALCON, MD Primary Gastroenterologist:  Dr. Aundria         Reason for Consultation:     Nausea vomiting  Date of Admission:  09/12/2023 Date of Consultation:  09/14/2023         HPI:   [name] is a 89 y.o. female who has a history of nausea and vomiting and failure to thrive in the past.  The patient is not able to give much history as she is eating at the time of my interview.  The patient does have a history of hypertension, heart failure, A-fib, COPD, GERD and breast cancer.  She also has psoriatic arthritis and pulmonary hypertension with a recent hip fracture.  The patients husband reports that the patient had the symptoms back when she was seen by Dr. Janalyn and Dr. Aundria in 2022 and 2023 and at that time had her medications adjusted and her symptoms resolved.  The the patient has been states that she has had her medications changed again and her symptoms reoccurred.  She has now been taken off some of those medications which appear to have caused her to have a heart block and now her husband states that she is eating well and has been consuming most of her plate while I am in the room interviewing her and her family.  The patient has also been started on heparin  and is on her second day of heparin  infusion. Upon admission the patient was found to have complete heart block with an EKG showing her heart rate in the 40s.  The patient's husband states that the patient is having dry heaving after her first bite of food on many occasions.  She is not actually vomiting up the food.  Past Medical History:  Diagnosis Date   Acquired thrombophilia (HCC)    Acute metabolic encephalopathy 06/19/2020   Allergy    Atherosclerosis of aorta (HCC)    Atrial fibrillation (HCC)    Bronchiectasis  (HCC)    Cancer (HCC)    left breast cancer    Chronic nausea    COPD (chronic obstructive pulmonary disease) (HCC)    GERD (gastroesophageal reflux disease)    Grade II diastolic dysfunction    Hyperlipemia    Hypertension    Intractable nausea and vomiting 06/20/2018   Mild concentric left ventricular hypertrophy (LVH)    Mild mitral stenosis by prior echocardiogram    Mild pulmonary hypertension (HCC)    Psoriatic arthritis (HCC)    Stage 2 moderate COPD by GOLD classification [facility])     Past Surgical History:  Procedure Laterality Date   BREAST LUMPECTOMY Left 2008   CATARACT EXTRACTION W/PHACO Left 02/13/2023   Procedure: CATARACT EXTRACTION PHACO AND INTRAOCULAR LENS PLACEMENT (IOC) LEFT  KDB GONIOTOMY;  Surgeon: Enola Feliciano Hugger, MD;  Location: [facility];  Service: Ophthalmology;  Laterality: Left;  10.53 0:55.   CATARACT EXTRACTION W/PHACO Right 02/27/2023   Procedure: CATARACT EXTRACTION PHACO AND INTRAOCULAR LENS PLACEMENT (IOC) RIGHT  [place], goniotomy 15.90 01:28.6;  Surgeon: Enola Feliciano Hugger, MD;  Location: [facility];  Service: Ophthalmology;  Laterality: Right;   CHOLECYSTECTOMY     HIP PINNING,CANNULATED Left 06/05/2020   Procedure: CANNULATED HIP PINNING;  Surgeon: Kathlynn Sharper, MD;  Location: [facility];  Service: Orthopedics;  Laterality: Left;   HIP PINNING,CANNULATED Right 08/16/2023   Procedure: FIXATION, FEMUR, NECK, PERCUTANEOUS, USING SCREW;  Surgeon: Edie Norleen PARAS, MD;  Location: [facility];  Service: Orthopedics;  Laterality: Right;   PARTIAL HYSTERECTOMY     TEE WITHOUT CARDIOVERSION N/A 06/10/2020   Procedure: TRANSESOPHAGEAL ECHOCARDIOGRAM (TEE);  Surgeon: Darliss Rogue, MD;  Location: [facility];  Service: Cardiovascular;  Laterality: N/A;    Prior to Admission medications   Medication Sig Start Date End Date Taking? Authorizing Provider  amiodarone  (PACERONE ) 200 MG tablet Take 1 tablet (200 mg total) by mouth daily. 08/22/23   Yes Patel, Sona, MD  dabigatran  (PRADAXA ) 150 MG CAPS capsule Take 1 capsule (150 mg total) by mouth every 12 (twelve) hours. 07/21/23  Yes Laurita Pillion, MD  docusate sodium  (COLACE) 100 MG capsule Take 1 capsule (100 mg total) by mouth 2 (two) times daily. 08/21/23  Yes Patel, Sona, MD  Fluticasone -Umeclidin-Vilant (TRELEGY ELLIPTA) 100-62.5-25 MCG/ACT AEPB Inhale 1 puff into the lungs daily.   Yes [provider]  folic acid  (FOLVITE ) 1 MG tablet Take 1 mg by mouth daily. Pt takes this med the days she does not take methotrexate  07/13/21  Yes [provider]  hydrochlorothiazide  (HYDRODIURIL ) 25 MG tablet Take 1 tablet by mouth daily. 08/10/23 08/09/24 Yes [provider]  HYDROcodone -acetaminophen  (NORCO/VICODIN) 5-325 MG tablet Take 1-2 tablets by mouth every 4 (four) hours as needed for moderate pain (pain score 4-6). 08/19/23  Yes Verlinda Boas, PA-C  LORazepam  (ATIVAN ) 0.5 MG tablet Take 1 tablet (0.5 mg total) by mouth at bedtime. 08/21/23  Yes Tobie Calix, MD  methotrexate  (RHEUMATREX) 2.5 MG tablet Take 5 mg by mouth once a week. Caution:Chemotherapy. Protect from light. Pt take 2 weekly.   Yes [provider]  metoCLOPramide  (REGLAN ) 10 MG tablet Take 1 tablet (10 mg total) by mouth 4 (four) times daily -  before meals and at bedtime. 08/28/23 09/27/23 Yes Bradler, Evan K, MD  metoprolol  tartrate (LOPRESSOR ) 25 MG tablet Take 1.5 tablets (37.5 mg total) by mouth 2 (two) times daily. 07/21/23  Yes Laurita Pillion, MD  omeprazole  (PRILOSEC) 40 MG capsule Take 40 mg by mouth daily.   Yes [provider]  ondansetron  (ZOFRAN ) 4 MG tablet Take 4 mg by mouth every 8 (eight) hours as needed for nausea or vomiting.   Yes [provider]  furosemide  (LASIX ) 40 MG tablet Take 1 tablet (40 mg total) by mouth daily. 07/22/23   Laurita Pillion, MD    Family History  Problem Relation Age of Onset   Breast cancer Sister      Social History   Tobacco Use   Smoking  status: Never   Smokeless tobacco: Never  Vaping Use   Vaping status: Never Used  Substance Use Topics   Alcohol use: No   Drug use: No    Allergies as of 09/12/2023 - Review Complete 09/12/2023  Allergen Reaction Noted   Amoxicillin  Nausea Only 10/20/2020   Eliquis  [apixaban ] Nausea Only 09/14/2020    Review of Systems:    All systems reviewed and negative except where noted in HPI.   Physical Exam:  Vital signs in last 24 hours: Temp:  [97.7 F (36.5 C)-98.9 F (37.2 C)] 98.6 F (37 C) (07/10 1145) Pulse Rate:  [43-50] 48 (07/10 1145) Resp:  [16-27] 16 (07/10 1145) BP: (131-186)/(40-55) 164/50 (07/10 1145) SpO2:  [93 %-97 %] 94 % (07/10 1145) Weight:  [62.2 kg-62.4 kg] 62.2 kg (07/10 0500) Last BM Date :  09/11/23 General:   Pleasant, cooperative in NAD Head:  Normocephalic and atraumatic. Eyes:   No icterus.   Conjunctiva pink. PERRLA. Ears:  Normal auditory acuity. Rectal:  Not performed. Msk:  Symmetrical without gross deformities.    Extremities:  Without edema, cyanosis or clubbing. Neurologic:  Alert and oriented x3;  grossly normal neurologically. Skin:  Intact without significant lesions or rashes. Psych:  Alert and cooperative. Normal affect.  LAB RESULTS: Recent Labs    09/12/23 1745 09/13/23 0353 09/14/23 0343  WBC 23.2* 21.5* 17.4*  HGB 14.1 12.2 11.6*  HCT 41.7 37.1 35.2*  PLT 273 230 229   BMET Recent Labs    09/12/23 1745 09/13/23 0353 09/14/23 0343  NA 135 136 137  K 3.5 2.8* 3.7  CL 98 99 102  CO2 24 23 23   GLUCOSE 120* 92 124*  BUN 22 19 30*  CREATININE 0.64 0.51 0.47  CALCIUM  8.9 7.9* 8.3*   LFT Recent Labs    09/12/23 1745  PROT 7.2  ALBUMIN 3.3*  AST 20  ALT 12  ALKPHOS 87  BILITOT 1.0   PT/INR Recent Labs    09/12/23 2322  LABPROT 17.0*  INR 1.3*    STUDIES: DG Chest Portable 1 View Result Date: 09/12/2023 CLINICAL DATA:  Weakness, shortness of breath EXAM: PORTABLE CHEST 1 VIEW COMPARISON:  07/18/2023  FINDINGS: Heart and mediastinal contours within normal limits. Aortic atherosclerosis. Right lung clear. Patchy opacity in the left lower lung again noted, improved since prior study. No visible effusions or acute bony abnormality. Biapical scarring. IMPRESSION: Persistent but improving left basilar atelectasis or infiltrate. No visible effusions. Electronically Signed   By: Franky Crease M.D.   On: 09/12/2023 19:30      Impression / Plan:   Assessment: Principal Problem:   Complete heart block (HCC) Active Problems:   Psoriasis vulgaris   Primary hypertension   Bronchiectasis (HCC)   Chronic obstructive pulmonary disease (HCC)   Leukocytosis   (HFpEF) heart failure with preserved ejection fraction (HCC)   Psoriatic arthritis (HCC)   Myocardial injury   Atrial fibrillation, chronic (HCC)   Symptomatic bradycardia   [name] is a 89 y.o. y/o female with poor p.o. intake that the family including the husband report to be medication related.  The patient has had her medication changed and now has finished most of her plate while I am sitting and talk to the family.  The patient is also on heparin  which precludes any endoscopic procedures at this time.  Plan:  I recommend continued supportive care with following her p.o. intake.  If the patient should stop eating again now that the medications have been changed then we may proceed with a upper endoscopy at that time after anticoagulation had stopped and that she has been cleared by cardiology after her recent complete heart block found on EKG.  I discussed my plan with the family and they agree with the plan.  Thank you for involving me in the care of this patient.      LOS: 2 days   Olivia Copping, MD, MD. NOLIA 09/14/2023, 2:11 PM,  Pager [phone] 7am-5pm  Check AMION for 5pm -7am coverage and on weekends   Note: This dictation was prepared with Dragon dictation along with smaller phrase technology. Any transcriptional errors  that result from this process are unintentional.

## 2023-09-14 NOTE — Progress Notes (Signed)
 [facility] CLINIC CARDIOLOGY PROGRESS NOTE       Patient ID: [name] MRN: [id number] DOB/AGE: September 09, 1933 89 y.o.  Admit date: 09/12/2023 Referring Physician Dr. Hilma Primary Physician Cleotilde Oneil FALCON, MD Primary Cardiologist Tinnie Maiden, NP; Dr. Dewane Reason for Consultation complete heart block  HPI: [name] is a 89 y.o. female  with a past medical history of history bradycardia, hypokalemia, paroxysmal atrial fibrillation (on Pradaxa ), chronic HFpEF, mild pulmonary hypertension, hypertension, hyperlipidemia, pulmonary fibrosis, COPD who presented to the ED on 09/12/2023 for worsening generalized weakness and poor intake.  Of note patient had a recent fall that resulted in right hip fracture and 08/16/2023 and recently got out of rehab about a week ago.  Heart rates were found to be in the 40s.  Cardiology was consulted for further evaluation.   Interval History: -Patient seen and examined this AM and laying comfortably in hospital bed. Patient states she feels okay and denies chest pain, lightheadedness or dizziness. Patient states she doesn't want PPM. When family is around patient seems to waver regarding PPM. Palliative following to discuss GOC. -Patient continues to refuse to take medications and eat/drink water due to nausea/dry heaving.  -Patients BP elevated and HR has remained stable in 40s. Per tele remains in complete heart block. -Patient remains on 2L with stable SpO2.    Pertinent Cardiac History (Most recent) 3 day Holter monitor:08/01/2023 to 08/04/2023 Predominant rhythm is sinus rhythm. Maximum heart rate 141 bpm, minimum heart rate 57 bpm the average heart rate of 74 bpm Occasional less than 1% PACs and rare PVCs noted 8 occurrences of short lasting supraventricular tachycardia, longest episode 17 beats 0 patient triggers  Impression: Predominant rhythm is sinus rhythm. No significant arrhythmias.   Review of systems complete and found to be negative  unless listed above    Past Medical History:  Diagnosis Date   Acquired thrombophilia (HCC)    Acute metabolic encephalopathy 06/19/2020   Allergy    Atherosclerosis of aorta (HCC)    Atrial fibrillation (HCC)    Bronchiectasis (HCC)    Cancer (HCC)    left breast cancer    Chronic nausea    COPD (chronic obstructive pulmonary disease) (HCC)    GERD (gastroesophageal reflux disease)    Grade II diastolic dysfunction    Hyperlipemia    Hypertension    Intractable nausea and vomiting 06/20/2018   Mild concentric left ventricular hypertrophy (LVH)    Mild mitral stenosis by prior echocardiogram    Mild pulmonary hypertension (HCC)    Psoriatic arthritis (HCC)    Stage 2 moderate COPD by GOLD classification [facility])     Past Surgical History:  Procedure Laterality Date   BREAST LUMPECTOMY Left 2008   CATARACT EXTRACTION W/PHACO Left 02/13/2023   Procedure: CATARACT EXTRACTION PHACO AND INTRAOCULAR LENS PLACEMENT (IOC) LEFT  KDB GONIOTOMY;  Surgeon: Enola Feliciano Hugger, MD;  Location: [facility];  Service: Ophthalmology;  Laterality: Left;  10.53 0:55.   CATARACT EXTRACTION W/PHACO Right 02/27/2023   Procedure: CATARACT EXTRACTION PHACO AND INTRAOCULAR LENS PLACEMENT (IOC) RIGHT  [place], goniotomy 15.90 01:28.6;  Surgeon: Enola Feliciano Hugger, MD;  Location: [facility];  Service: Ophthalmology;  Laterality: Right;   CHOLECYSTECTOMY     HIP PINNING,CANNULATED Left 06/05/2020   Procedure: CANNULATED HIP PINNING;  Surgeon: Kathlynn Sharper, MD;  Location: [facility];  Service: Orthopedics;  Laterality: Left;   HIP PINNING,CANNULATED Right 08/16/2023   Procedure: FIXATION, FEMUR, NECK, PERCUTANEOUS, USING SCREW;  Surgeon:  Poggi, Norleen PARAS, MD;  Location: [facility];  Service: Orthopedics;  Laterality: Right;   PARTIAL HYSTERECTOMY     TEE WITHOUT CARDIOVERSION N/A 06/10/2020   Procedure: TRANSESOPHAGEAL ECHOCARDIOGRAM (TEE);  Surgeon: Darliss Rogue, MD;  Location: [facility];   Service: Cardiovascular;  Laterality: N/A;    Medications Prior to Admission  Medication Sig Dispense Refill Last Dose/Taking   amiodarone  (PACERONE ) 200 MG tablet Take 1 tablet (200 mg total) by mouth daily. 30 tablet 0 09/12/2023 Morning   dabigatran  (PRADAXA ) 150 MG CAPS capsule Take 1 capsule (150 mg total) by mouth every 12 (twelve) hours. 60 capsule 0 09/12/2023 at  8:00 AM   docusate sodium  (COLACE) 100 MG capsule Take 1 capsule (100 mg total) by mouth 2 (two) times daily. 10 capsule 0 Past Month   Fluticasone -Umeclidin-Vilant (TRELEGY ELLIPTA) 100-62.5-25 MCG/ACT AEPB Inhale 1 puff into the lungs daily.   09/12/2023 Morning   folic acid  (FOLVITE ) 1 MG tablet Take 1 mg by mouth daily. Pt takes this med the days she does not take methotrexate    09/12/2023 Morning   hydrochlorothiazide  (HYDRODIURIL ) 25 MG tablet Take 1 tablet by mouth daily.   09/12/2023 Morning   HYDROcodone -acetaminophen  (NORCO/VICODIN) 5-325 MG tablet Take 1-2 tablets by mouth every 4 (four) hours as needed for moderate pain (pain score 4-6). 30 tablet 0 09/12/2023   LORazepam  (ATIVAN ) 0.5 MG tablet Take 1 tablet (0.5 mg total) by mouth at bedtime. 10 tablet 0 09/12/2023   methotrexate  (RHEUMATREX) 2.5 MG tablet Take 5 mg by mouth once a week. Caution:Chemotherapy. Protect from light. Pt take 2 weekly.   09/10/2023   metoCLOPramide  (REGLAN ) 10 MG tablet Take 1 tablet (10 mg total) by mouth 4 (four) times daily -  before meals and at bedtime. 120 tablet 0 09/12/2023 Morning   metoprolol  tartrate (LOPRESSOR ) 25 MG tablet Take 1.5 tablets (37.5 mg total) by mouth 2 (two) times daily. 90 tablet 0 Past Week   omeprazole  (PRILOSEC) 40 MG capsule Take 40 mg by mouth daily.   09/12/2023 Morning   ondansetron  (ZOFRAN ) 4 MG tablet Take 4 mg by mouth every 8 (eight) hours as needed for nausea or vomiting.   Unknown   [Paused] furosemide  (LASIX ) 40 MG tablet Take 1 tablet (40 mg total) by mouth daily. 30 tablet 0    Social History   Socioeconomic  History   Marital status: Married    Spouse name: Not on file   Number of children: 2   Years of education: Not on file   Highest education level: Not on file  Occupational History   Not on file  Tobacco Use   Smoking status: Never   Smokeless tobacco: Never  Vaping Use   Vaping status: Never Used  Substance and Sexual Activity   Alcohol use: No   Drug use: No   Sexual activity: Not on file  Other Topics Concern   Not on file  Social History Narrative   Not on file   Social Drivers of Health   Financial Resource Strain: Low Risk  (08/10/2023)   Received from [facility]   Overall Financial Resource Strain (CARDIA)    Difficulty of Paying Living Expenses: Not hard at all  Food Insecurity: No Food Insecurity (09/14/2023)   Hunger Vital Sign    Worried About Running Out of Food in the Last Year: Never true    Ran Out of Food in the Last Year: Never true  Transportation Needs: No Transportation Needs (09/14/2023)  PRAPARE - Administrator, Civil Service (Medical): No    Lack of Transportation (Non-Medical): No  Physical Activity: Sufficiently Active (02/17/2021)   Exercise Vital Sign    Days of Exercise per Week: 7 days    Minutes of Exercise per Session: 30 min  Stress: No Stress Concern Present (02/17/2021)   [employer] of Occupational Health - Occupational Stress Questionnaire    Feeling of Stress : Not at all  Social Connections: Moderately Isolated (09/14/2023)   Social Connection and Isolation Panel    Frequency of Communication with Friends and Family: More than three times a week    Frequency of Social Gatherings with Friends and Family: Three times a week    Attends Religious Services: Never    Active Member of Clubs or Organizations: No    Attends Banker Meetings: Never    Marital Status: Married  Catering manager Violence: Not At Risk (09/14/2023)   Humiliation, Afraid, Rape, and Kick questionnaire    Fear of  Current or Ex-Partner: No    Emotionally Abused: No    Physically Abused: No    Sexually Abused: No    Family History  Problem Relation Age of Onset   Breast cancer Sister      Vitals:   09/13/23 2330 09/14/23 0409 09/14/23 0500 09/14/23 0721  BP: (!) 162/50 (!) 131/44  (!) 156/45  Pulse: (!) 50 (!) 43  (!) 46  Resp:  20  16  Temp:  98.9 F (37.2 C)  97.7 F (36.5 C)  TempSrc:    Oral  SpO2:  95%  95%  Weight:   62.2 kg   Height:        PHYSICAL EXAM General: Chronically ill-appearing elderly female, well nourished, in no acute distress. HEENT: Normocephalic and atraumatic. Neck: No JVD.   Lungs: Normal respiratory effort on room air. Clear bilaterally to auscultation. No wheezes, crackles, rhonchi.  Heart: HRR, slow rates. Normal S1 and S2 without gallops or murmurs.  Abdomen: Non-distended appearing.  Msk: Normal strength and tone for age. Extremities: Warm and well perfused. No clubbing, cyanosis, edema.  Neuro: Alert and oriented X 3. Psych: Answers questions appropriately.   Labs: Basic Metabolic Panel: Recent Labs    09/12/23 1745 09/13/23 0353 09/14/23 0343  NA 135 136 137  K 3.5 2.8* 3.7  CL 98 99 102  CO2 24 23 23   GLUCOSE 120* 92 124*  BUN 22 19 30*  CREATININE 0.64 0.51 0.47  CALCIUM  8.9 7.9* 8.3*  MG 1.9  --  1.8   Liver Function Tests: Recent Labs    09/12/23 1745  AST 20  ALT 12  ALKPHOS 87  BILITOT 1.0  PROT 7.2  ALBUMIN 3.3*   No results for input(s): LIPASE, AMYLASE in the last 72 hours. CBC: Recent Labs    09/12/23 1745 09/13/23 0353 09/14/23 0343  WBC 23.2* 21.5* 17.4*  NEUTROABS 19.9*  --   --   HGB 14.1 12.2 11.6*  HCT 41.7 37.1 35.2*  MCV 87.8 89.4 88.9  PLT 273 230 229   Cardiac Enzymes: Recent Labs    09/12/23 1856 09/12/23 2322 09/13/23 0103  TROPONINIHS 20* 16 22*   BNP: Recent Labs    09/12/23 1745  BNP 776.7*   D-Dimer: No results for input(s): DDIMER in the last 72 hours. Hemoglobin  A1C: No results for input(s): HGBA1C in the last 72 hours. Fasting Lipid Panel: No results for input(s): CHOL, HDL, LDLCALC, TRIG, CHOLHDL,  LDLDIRECT in the last 72 hours. Thyroid  Function Tests: Recent Labs    09/13/23 0103  TSH 3.407   Anemia Panel: No results for input(s): VITAMINB12, FOLATE, FERRITIN, TIBC, IRON , RETICCTPCT in the last 72 hours.   Radiology: DG Chest Portable 1 View Result Date: 09/12/2023 CLINICAL DATA:  Weakness, shortness of breath EXAM: PORTABLE CHEST 1 VIEW COMPARISON:  07/18/2023 FINDINGS: Heart and mediastinal contours within normal limits. Aortic atherosclerosis. Right lung clear. Patchy opacity in the left lower lung again noted, improved since prior study. No visible effusions or acute bony abnormality. Biapical scarring. IMPRESSION: Persistent but improving left basilar atelectasis or infiltrate. No visible effusions. Electronically Signed   By: Franky Crease M.D.   On: 09/12/2023 19:30   DG HIP UNILAT WITH PELVIS 2-3 VIEWS RIGHT Result Date: 08/16/2023 CLINICAL DATA:  Elective surgery.  Right hip. EXAM: DG HIP (WITH OR WITHOUT PELVIS) 2-3V RIGHT COMPARISON:  Pelvis and right hip radiographs 08/16/2023 and CT right hip 08/16/2023 FINDINGS: Images were performed intraoperatively without the presence of a radiologist. The patient is undergoing ORIF of the previously seen subcapital proximal right femoral fracture with 3 longitudinal screws. Total fluoroscopy images: 5 Total fluoroscopy time: 34 seconds Total dose: Radiation Exposure Index (as provided by the fluoroscopic device): 5.1 mGy air Kerma Please see intraoperative findings for further detail. IMPRESSION: Intraoperative fluoroscopy for ORIF of the proximal right femoral fracture. Electronically Signed   By: Tanda Lyons M.D.   On: 08/16/2023 17:46   DG C-Arm 1-60 Min-No Report Result Date: 08/16/2023 Fluoroscopy was utilized by the requesting physician.  No radiographic  interpretation.   CT Hip Right Wo Contrast Result Date: 08/16/2023 CLINICAL DATA:  Right hip pain after fall. EXAM: CT OF THE RIGHT HIP WITHOUT CONTRAST TECHNIQUE: Multidetector CT imaging of the right hip was performed according to the standard protocol. Multiplanar CT image reconstructions were also generated. RADIATION DOSE REDUCTION: This exam was performed according to the departmental dose-optimization program which includes automated exposure control, adjustment of the mA and/or kV according to patient size and/or use of iterative reconstruction technique. COMPARISON:  Same day radiographs of the right hip dated 08/16/2023. FINDINGS: Bones/Joint/Cartilage Subtle region of cortical and trabecular lucency along the posterior femoral head and neck junction (series 7, image 60), may reflect a nondisplaced fracture. The remainder of the visualized bones are intact. The right femoral head is seated within the acetabulum. Moderate osteoarthritis of the right hip manifested by joint space narrowing and marginal osteophytosis. Ligaments Ligaments are suboptimally evaluated by CT. Muscles and Tendons Muscles are within normal limits. No intramuscular fluid collection or hematoma. Soft tissue No fluid collection or hematoma. No soft tissue mass. Small fat containing right inguinal hernia. IMPRESSION: 1. Subtle region of cortical and trabecular lucency along the right posterior femoral head and neck junction may reflect a nondisplaced fracture. The remainder of the visualized bones are intact 2. Moderate osteoarthritis of the right hip. Electronically Signed   By: Harrietta Sherry M.D.   On: 08/16/2023 12:33   DG Hip Unilat W or Wo Pelvis 2-3 Views Right Result Date: 08/16/2023 CLINICAL DATA:  Right hip pain after fall today. EXAM: DG HIP (WITH OR WITHOUT PELVIS) 2-3V RIGHT COMPARISON:  None Available. FINDINGS: There is no evidence of hip fracture or dislocation. There is no evidence of arthropathy or other focal  bone abnormality. IMPRESSION: Negative. Electronically Signed   By: Lynwood Landy Raddle M.D.   On: 08/16/2023 11:16   CT Head Wo Contrast Result Date: 08/16/2023  CLINICAL DATA:  Trauma, fall, fell off of stool and hit right-side of head. EXAM: CT HEAD WITHOUT CONTRAST CT CERVICAL SPINE WITHOUT CONTRAST TECHNIQUE: Multidetector CT imaging of the head and cervical spine was performed following the standard protocol without intravenous contrast. Multiplanar CT image reconstructions of the cervical spine were also generated. RADIATION DOSE REDUCTION: This exam was performed according to the departmental dose-optimization program which includes automated exposure control, adjustment of the mA and/or kV according to patient size and/or use of iterative reconstruction technique. COMPARISON:  CT head 06/05/2020. FINDINGS: CT HEAD FINDINGS Brain: No acute intracranial hemorrhage. Redemonstrated remote lacunar infarct versus prominent perivascular space in the inferior right basal ganglia. Increased heterogeneity of the basal ganglia particularly on the left likely related to chronic microvascular changes. Nonspecific hypoattenuation in the periventricular and subcortical white matter favored to reflect chronic microvascular ischemic changes. Mild parenchymal volume loss. No edema, mass effect, or midline shift. The basilar cisterns are patent. Ventricles: The ventricles are normal. Vascular: Atherosclerotic calcifications of the carotid siphons and intracranial vertebral arteries. No hyperdense vessel. Skull: No acute or aggressive finding. Orbits: Orbits are symmetric. Sinuses: The visualized paranasal sinuses are clear. Other: Mastoid air cells are clear. CT CERVICAL SPINE FINDINGS Alignment: Cervical lordosis is maintained. Trace retrolisthesis of C4 on C5. No facet subluxation or dislocation. Skull base and vertebrae: No compression fracture or displaced fracture in the cervical spine. No suspicious osseous lesion.  Chronic appearing deformity of the medial left clavicle. Soft tissues and spinal canal: No prevertebral fluid or swelling. No visible canal hematoma. Disc levels: Intervertebral disc space narrowing greatest at C4-5. Disc osteophyte complexes at multiple levels. No high-grade osseous spinal canal stenosis. Facet arthrosis and uncovertebral hypertrophy throughout the cervical spine. Foraminal stenosis most pronounced on the right at C6-7. Upper chest: Biapical pleuroparenchymal scarring and pleural calcifications. Other: None IMPRESSION: No CT evidence of acute intracranial abnormality. No acute fracture or traumatic malalignment of the cervical spine. Chronic appearing deformity of the medial left clavicle. Redemonstrated chronic microvascular ischemic changes. Degenerative changes of the cervical spine as above. Electronically Signed   By: Donnice Mania M.D.   On: 08/16/2023 11:07   CT Cervical Spine Wo Contrast Result Date: 08/16/2023 CLINICAL DATA:  Trauma, fall, fell off of stool and hit right-side of head. EXAM: CT HEAD WITHOUT CONTRAST CT CERVICAL SPINE WITHOUT CONTRAST TECHNIQUE: Multidetector CT imaging of the head and cervical spine was performed following the standard protocol without intravenous contrast. Multiplanar CT image reconstructions of the cervical spine were also generated. RADIATION DOSE REDUCTION: This exam was performed according to the departmental dose-optimization program which includes automated exposure control, adjustment of the mA and/or kV according to patient size and/or use of iterative reconstruction technique. COMPARISON:  CT head 06/05/2020. FINDINGS: CT HEAD FINDINGS Brain: No acute intracranial hemorrhage. Redemonstrated remote lacunar infarct versus prominent perivascular space in the inferior right basal ganglia. Increased heterogeneity of the basal ganglia particularly on the left likely related to chronic microvascular changes. Nonspecific hypoattenuation in the  periventricular and subcortical white matter favored to reflect chronic microvascular ischemic changes. Mild parenchymal volume loss. No edema, mass effect, or midline shift. The basilar cisterns are patent. Ventricles: The ventricles are normal. Vascular: Atherosclerotic calcifications of the carotid siphons and intracranial vertebral arteries. No hyperdense vessel. Skull: No acute or aggressive finding. Orbits: Orbits are symmetric. Sinuses: The visualized paranasal sinuses are clear. Other: Mastoid air cells are clear. CT CERVICAL SPINE FINDINGS Alignment: Cervical lordosis is maintained. Trace retrolisthesis of C4 on  C5. No facet subluxation or dislocation. Skull base and vertebrae: No compression fracture or displaced fracture in the cervical spine. No suspicious osseous lesion. Chronic appearing deformity of the medial left clavicle. Soft tissues and spinal canal: No prevertebral fluid or swelling. No visible canal hematoma. Disc levels: Intervertebral disc space narrowing greatest at C4-5. Disc osteophyte complexes at multiple levels. No high-grade osseous spinal canal stenosis. Facet arthrosis and uncovertebral hypertrophy throughout the cervical spine. Foraminal stenosis most pronounced on the right at C6-7. Upper chest: Biapical pleuroparenchymal scarring and pleural calcifications. Other: None IMPRESSION: No CT evidence of acute intracranial abnormality. No acute fracture or traumatic malalignment of the cervical spine. Chronic appearing deformity of the medial left clavicle. Redemonstrated chronic microvascular ischemic changes. Degenerative changes of the cervical spine as above. Electronically Signed   By: Donnice Mania M.D.   On: 08/16/2023 11:07    ECHO ordered. 05/19/2023 1. Left ventricular ejection fraction, by estimation, is 60 to 65%. The left ventricle has normal function. The left ventricle has no regional wall motion abnormalities. There is mild left ventricular hypertrophy. Left  ventricular diastolic parameters are consistent with Grade II diastolic dysfunction (pseudonormalization).   2. Right ventricular systolic function is normal. The right ventricular size is normal. There is mildly elevated pulmonary artery systolic pressure.   3. Left atrial size was mildly dilated.   4. The mitral valve is normal in structure. No evidence of mitral valve regurgitation. Mild mitral stenosis. The mean mitral valve gradient is 4.0 mmHg. Severe mitral annular calcification.   5. Tricuspid valve regurgitation is mild to moderate.   6. The aortic valve is normal in structure. Aortic valve regurgitation is not visualized. Aortic valve sclerosis is present, with no evidence of aortic valve stenosis.   7. The inferior vena cava is normal in size with greater than 50% respiratory variability, suggesting right atrial pressure of 3 mmHg.    TELEMETRY reviewed by me 09/14/2023: complete heart block, rate 40s   EKG reviewed by me:  complete heart block, rate 50 bpm with nonspecific ST-T wave changes  Data reviewed by me 09/14/2023: last 24h vitals tele labs imaging I/O hospitalist progress notes.  Principal Problem:   Complete heart block (HCC) Active Problems:   Psoriasis vulgaris   Primary hypertension   Bronchiectasis (HCC)   Chronic obstructive pulmonary disease (HCC)   Leukocytosis   (HFpEF) heart failure with preserved ejection fraction (HCC)   Psoriatic arthritis (HCC)   Myocardial injury   Atrial fibrillation, chronic (HCC)   Symptomatic bradycardia    ASSESSMENT AND PLAN:  [name] is a 89 y.o. female  with a past medical history of history bradycardia, hypokalemia, paroxysmal atrial fibrillation (on Pradaxa ), chronic HFpEF, mild pulmonary hypertension, hyperlipidemia, pulmonary fibrosis, COPD who presented to the ED on 09/12/2023 for worsening generalized weakness and poor intake.  Of note patient had a recent fall that resulted in right hip fracture and 08/16/2023 and  recently got out of rehab about a week ago.  Heart rates were found to be in the 40s.  Cardiology was consulted for further evaluation.  # Complete heart block # History of bradycardia # Paroxysmal atrial fibrillation Patient presents with worsening generalized weakness and poor intake for the last several weeks.  Per husband heart rates have been running in mid 40s for the last several weeks.  EKG in ED revealed high-grade AV block with rate 50.  Per telemetry rate remains in 40s.  TSH within normal limits.   -Echo ordered. -  Avoid all AVN blockers.  -Hold home metoprolol  and amiodarone . (Last taken on 07/08) -Monitor and replenish electrolytes for a goal K >4, Mag >2  -Continue heparin  infusion for stroke risk reduction. (Home Pradaxa  held) -Discussed with patient, patient's husband and daughter regarding slow heart rates. Family states patient has HR in mid 40s for past several weeks. Per tele patient HR remains in 40s in complete heart block. Discussed holding home metoprolol  and amiodarone  and allowing medication to get out of system. Also discussed possibility of PPM if HR does not improve or worsen and that would require transfer to [facility] for PPM procedure. Family and patient agree with monitoring telemetry/HR for now to see if HR improves off of AVN blockers. On 07/09 and 07/10 patient states she doesn't want PPM.  -Palliative following to discuss GOC. On 07/09 per palliative patient endorsed DNR/DNI and no artificial intervention - no pacer, no feeding tube. Discussions will continue with family and patient.  -Recommend adequate hydration and p.o. intake.  Recommend PT for deconditioning.  # Chronic HFpEF Patient without shortness of breath or LEE. Chest x-ray with no evidence of congestion or pleural effusions.  BNP elevated at 770.  -Continue to hold home PO lasix  40 mg daily as patient does not appear volume up and refusing to drink water.  -Will plan to optimize GDMT as BP and renal  function allow.   # Hypertension # Hyperlipidemia # Demand ischemia Patient without chest pain. Troponins minimally elevated and flat 21 > 20 > 16 > 22.  EKG with high grade AV block, rate 50 bpm with nonspecific ST-T wave changes.  -D/c home hydrochlorothiazide  due to electrolyte abnormalities, hypokalemia. -Continue losartan  25 mg daily.  -Hold metoprolol  as stated above.  This patient's plan of care was discussed and created with Dr. Wilburn and he is in agreement.  Signed: Dorene Comfort, PA-C  09/14/2023, 7:23 AM [facility]

## 2023-09-14 NOTE — Care Management Important Message (Signed)
 Important Message  Patient Details  Name: [name] MRN: [id number] Date of Birth: [birth year]   Important Message Given:  Yes - Medicare IM     [name] 09/14/2023, 2:11 PM

## 2023-09-14 NOTE — Progress Notes (Signed)
 Palliative Care Progress Note, Assessment & Plan   Patient Name: [name]       Date: 09/14/2023 DOB: September 24, 1933  Age: 89 y.o. MRN#: [id number] Attending Physician: Jhonny Calvin NOVAK, MD Primary Care Physician: Cleotilde Oneil FALCON, MD Admit Date: 09/12/2023  Subjective: Patient is sitting up in bed no apparent distress.  She acknowledges my presence and is able to make her wishes known.  Her husband [name] and daughter [name] are at bedside during my visit.  HPI: 89 y.o. female  with past medical history of HTN, HLD, HFpEF, A-fib on Pradaxa , COPD, bronchiectasis, GERD, breast cancer s/p of a left lumpectomy), Psoriatec arthritis, mild pulmonary hypertension, recent right hip fracture  admitted on 09/12/2023 with weakness and poor PO intake.   Patient is being treated for complete heart block with symptomatic bradycardia, A-fib, bronchiectasis, and COPD.    Cardiology and SLP have been consulted in addition to PMT to support patient with goals of care discussions.  Summary of counseling/coordination of care: Extensive chart review completed prior to meeting patient including labs, vital signs, imaging, progress notes, orders, and available advanced directive documents from current and previous encounters.   After reviewing the patient's chart and assessing the patient at bedside, I spoke with patient in regards to symptom management and goals of care.   Symptoms assessed.  Patient complains of dry mouth.  Oral moisturizer provided.  No other acute complaints at this time.  Discussed results from blood work this morning.  Cortisol level is within normal limits and therefore rules out adrenal insufficiency.  Discussed that GI consult has been placed for further evaluation of patient's difficulty keeping food  down.  Family endorses patient ate a lot of her breakfast.  Family Dors is patient ate 1 pancake and drink some milk.  Patient shares this is more than she has had in a while.  We discussed food being for fuel and not for fun.  She endorses that she got dry heaves or feel the need to vomit when she ate this morning.  Encouraged continued p.o. intake and small bites/snacks throughout the day.  Discussed that cardiology's recommendations are to continue to hold metoprolol  and amiodarone .  Patient's heart rate has not significantly improved since yesterday but has not gone down anymore.  Again reviewed the pros and cons of placement of pacemaker.  Patient again confirms that she does not want a pacemaker.  However, her daughter [name] says are you sure?  And then patient shares not less I have to.  Advised patient and family to have discussions of thoughts and opinions regarding pacemaker but that ultimately patient's views, opinions, and choice is solely hers.  Family shares understanding and shares they will continue to discuss.  No emergent need for pacemaker at this time but encouraged family to voice their opinions and then let patient keep autonomy to make a decision for her own self/body.  After visiting with family, counseled with attending.  Advised attending that patient has had difficulty urinating in bed.  Patient may need In-N-Out catheter or placement of Foley though she has never Kolls for retention at this time.  No adjustment to plan of care at this time.  PMT  will continue to follow and support.    Physical Exam Vitals reviewed.  Constitutional:      General: She is not in acute distress.    Appearance: She is normal weight.  HENT:     Head: Normocephalic.     Mouth/Throat:     Mouth: Mucous membranes are dry.  Eyes:     Pupils: Pupils are equal, round, and reactive to light.  Pulmonary:     Effort: Pulmonary effort is normal.  Abdominal:     Palpations: Abdomen is soft.   Skin:    General: Skin is warm and dry.  Neurological:     Mental Status: She is alert and oriented to person, place, and time.  Psychiatric:        Mood and Affect: Mood normal.        Behavior: Behavior normal.        Thought Content: Thought content normal.        Judgment: Judgment normal.             Total Time 50 minutes   Time spent includes: Detailed review of medical records (labs, imaging, vital signs), medically appropriate exam (mental status, respiratory, cardiac, skin), discussed with treatment team, counseling and educating patient, family and staff, documenting clinical information, medication management and coordination of care.  Lamarr L. Arvid, DNP, FNP-BC Palliative Medicine Team

## 2023-09-15 ENCOUNTER — Inpatient Hospital Stay

## 2023-09-15 ENCOUNTER — Inpatient Hospital Stay: Admit: 2023-09-15 | Discharge: 2023-09-15 | Disposition: A | Discharge status: Home / Self Care

## 2023-09-15 DIAGNOSIS — I442 Atrioventricular block, complete: Secondary | ICD-10-CM | POA: Diagnosis not present

## 2023-09-15 DIAGNOSIS — R001 Bradycardia, unspecified: Secondary | ICD-10-CM | POA: Diagnosis not present

## 2023-09-15 DIAGNOSIS — R531 Weakness: Secondary | ICD-10-CM | POA: Diagnosis not present

## 2023-09-15 DIAGNOSIS — R638 Other symptoms and signs concerning food and fluid intake: Secondary | ICD-10-CM | POA: Diagnosis not present

## 2023-09-15 DIAGNOSIS — I5030 Unspecified diastolic (congestive) heart failure: Secondary | ICD-10-CM | POA: Diagnosis not present

## 2023-09-15 LAB — CBC
HCT: 33.9 % — ABNORMAL LOW (ref 36.0–46.0)
Hemoglobin: 11.5 g/dL — ABNORMAL LOW (ref 12.0–15.0)
MCH: 29.9 pg (ref 26.0–34.0)
MCHC: 33.9 g/dL (ref 30.0–36.0)
MCV: 88.3 fL (ref 80.0–100.0)
Platelets: 231 K/uL (ref 150–400)
RBC: 3.84 MIL/uL — ABNORMAL LOW (ref 3.87–5.11)
RDW: 15.5 % (ref 11.5–15.5)
WBC: 14.5 K/uL — ABNORMAL HIGH (ref 4.0–10.5)
nRBC: 0 % (ref 0.0–0.2)

## 2023-09-15 LAB — BASIC METABOLIC PANEL WITH GFR
Anion gap: 9 (ref 5–15)
BUN: 24 mg/dL — ABNORMAL HIGH (ref 8–23)
CO2: 23 mmol/L (ref 22–32)
Calcium: 8.5 mg/dL — ABNORMAL LOW (ref 8.9–10.3)
Chloride: 105 mmol/L (ref 98–111)
Creatinine, Ser: 0.57 mg/dL (ref 0.44–1.00)
GFR, Estimated: >60 mL/min (ref >60)
Glucose, Bld: 110 mg/dL — ABNORMAL HIGH (ref 70–99)
Potassium: 4.8 mmol/L (ref 3.5–5.1)
Sodium: 137 mmol/L (ref 135–145)

## 2023-09-15 LAB — ECHOCARDIOGRAM COMPLETE
AR max vel: 1.61 cm2
AV Area VTI: 1.81 cm2
AV Area mean vel: 1.5 cm2
AV Mean grad: 5 mmHg
AV Peak grad: 7.8 mmHg
Ao pk vel: 1.4 m/s
Area-P 1/2: 2.36 cm2
Height: 62 in
MV VTI: 0.78 cm2
S' Lateral: 2.6 cm
Weight: 2204.6 [oz_av]

## 2023-09-15 LAB — MAGNESIUM: Magnesium: 2.4 mg/dL (ref 1.7–2.4)

## 2023-09-15 LAB — HEPARIN LEVEL (UNFRACTIONATED): Heparin Unfractionated: 0.44 [IU]/mL (ref 0.30–0.70)

## 2023-09-15 MED ORDER — PANTOPRAZOLE SODIUM 40 MG PO TBEC
40.0000 mg | DELAYED_RELEASE_TABLET | Freq: Every day | ORAL | Status: DC
  Administered 2023-09-15 – 2023-09-21 (×6): 40 mg via ORAL
  Filled 2023-09-15 (×7): qty 1

## 2023-09-15 MED ORDER — METOCLOPRAMIDE HCL 10 MG PO TABS
10.0000 mg | ORAL_TABLET | Freq: Three times a day (TID) | ORAL | Status: DC
  Administered 2023-09-15 – 2023-09-21 (×19): 10 mg via ORAL
  Filled 2023-09-15 (×18): qty 1

## 2023-09-15 NOTE — TOC Progression Note (Signed)
 Transition of Care [facility]) - Progression Note    Patient Details  Name: [name] MRN: [id number] Date of Birth: 07/30/1933  [facility] CM/SW Contact  Lauraine JAYSON Carpen, LCSW Phone Number: 09/15/2023, 4:30 PM  Clinical Narrative:   Patient is active with [facility] for PT.    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[place])                   DME Agency: Other - Comment (Unknown)                   Social Determinants of Health (SDOH) Interventions SDOH Screenings   Food Insecurity: No Food Insecurity (09/14/2023)  Housing: Low Risk  (09/14/2023)  Transportation Needs: No Transportation Needs (09/14/2023)  Utilities: Not At Risk (09/14/2023)  Alcohol Screen: Low Risk  (02/17/2021)  Depression (PHQ2-9): Medium Risk (11/05/2021)  Financial Resource Strain: Low Risk  (08/10/2023)   Received from [facility]  Physical Activity: Sufficiently Active (02/17/2021)  Social Connections: Moderately Isolated (09/14/2023)  Stress: No Stress Concern Present (02/17/2021)  Tobacco Use: Low Risk  (09/14/2023)  Recent Concern: Tobacco Use - Medium Risk (08/10/2023)   Received from [facility]    Readmission Risk Interventions     No data to display

## 2023-09-15 NOTE — Progress Notes (Signed)
*  PRELIMINARY RESULTS* Echocardiogram 2D Echocardiogram has been performed.  [name] 09/15/2023, 11:04 AM

## 2023-09-15 NOTE — Progress Notes (Signed)
 PROGRESS NOTE    [name]  FMW:[id number] DOB: Sep 15, 1933 DOA: 09/12/2023 PCP: Cleotilde Oneil FALCON, MD    Brief Narrative:  89 y.o. female with medical history significant of HTN, HLD, HFpEF, A-fib on Pradaxa , COPD, bronchiectasis, GERD, breast cancer s/p of a left lumpectomy), Psoriatec arthritis, mild pulmonary hypertension, recent right hip fracture, who presents with weakness.   Patient was recently hospitalized from 6/11 - 6/16 due to mechanical fall and right hip fracture.  Patient was s/p of right hip surgery.  Patient states that her surgical site has been healing well.  In the past several days she has been feeling generalized weak, with poor appetite and decreased oral intake, cannot ambulate without assistance due to weakness.  No fall.  She has nausea, no vomiting, diarrhea or abdominal pain.  No chest pain, cough, SOB.  Denies symptoms of UTI.  No rectal bleeding or dark stool.  Per her husband, patient's heart rate was at 40s when he checked it for her at home.   Pt was found to have new complete heart block on EKG with heart rate in 40s in ED. Pt is taking amiodarone  200 mg daily and metoprolol  37.5 mg twice daily for A-fib currently.    Assessment & Plan:   Principal Problem:   Complete heart block (HCC) Active Problems:   Atrial fibrillation, chronic (HCC)   Myocardial injury   (HFpEF) heart failure with preserved ejection fraction (HCC)   Primary hypertension   Leukocytosis   Bronchiectasis (HCC)   Chronic obstructive pulmonary disease (HCC)   Psoriasis vulgaris   Psoriatic arthritis (HCC)   Symptomatic bradycardia   Malnutrition of moderate degree   Anorexia   Nausea and vomiting  High degree heart block Symptomatic bradycardia Currently hemodynamically stable.  No chest pain or shortness of breath.  Heart rate 40.  EDP consulted with cardiology who recommend holding metoprolol  and amiodarone .  Remains in heart block.  Was previously refusing PPM but now  agreeable upon repeat conversation. Plan: Continue telemetry Continue holding amiodarone  and metoprolol  Cardiology following Pacemaker plan Monday 7/14  Chronic A-fib: Currently bradycardic.   Amiodarone  metoprolol  on hold Telemetry as above Continue heparin  GTT   Myocardial injury:  trop 21.  Patient does not have chest pain or SOB.   Likely demand ischemia. Continue telemetry  (HFpEF) heart failure with preserved ejection fraction (HCC):  2D echo on 05/19/2023 showed EF 60 to 65% with grade 2 diastolic dysfunction.  Patient does not have leg edema or JVD.  No SOB.  CHF seems to be compensated. - No diuretic   Primary hypertension Discontinue hydrochlorothiazide .   IV hydralazine  as needed.     Leukocytosis:  Appears to be WBC 23.2, no source infection identified so far.   Appears to be chronic issue .  No indication of infection.  Continue to hold antibiotics.   Bronchiectasis (HCC) and Chronic obstructive pulmonary disease (HCC):  stable.  No wheezing, no SOB. - Continue bronchodilators and as needed Mucinex    Hx of psoriasis vulgaris and psoriatic arthritis (HCC): - Patient is on weekly methotrexate  (every Sunday).  Defer to outpatient  Dry heaving Poor p.o. intake Intractable nausea Much improved.  Appears to be medication related.  Possibly secondary to amiodarone .  Amiodarone  on hold and oral intake is much improved.  GI consult requested but have signed off.  No plans for inpatient endoscopy.  Discontinue IV Reglan  and IV Protonix .  Start p.o. Reglan  and p.o. Protonix .  Diet as tolerated.  DVT prophylaxis: Heparin  Code Status: Full Family Communication: Husband at bedside 7/9, 7/10, 7/11.  Daughter at bedside 7/10, 7/11 Disposition Plan: Status is: Inpatient Remains inpatient appropriate because: Multiple acute issues as above   Level of care: Telemetry Cardiac  Consultants:  Cardiology  Procedures:  None  Antimicrobials: None   Subjective: Seen  and examined.  Resting in chair.  Affect flattened but answers questions appropriately.  Seems to be eating more.  Objective: Vitals:   09/15/23 0500 09/15/23 0733 09/15/23 1122 09/15/23 1227  BP:  (!) 167/48 (!) 182/43 (!) 151/38  Pulse:  (!) 42 (!) 46 (!) 50  Resp:      Temp:  98.3 F (36.8 C) 97.7 F (36.5 C)   TempSrc:      SpO2:  99% 93%   Weight: 62.5 kg     Height:        Intake/Output Summary (Last 24 hours) at 09/15/2023 1332 Last data filed at 09/15/2023 0900 Gross per 24 hour  Intake 621.16 ml  Output --  Net 621.16 ml   Filed Weights   09/13/23 2300 09/14/23 0500 09/15/23 0500  Weight: 62.4 kg 62.2 kg 62.5 kg    Examination:  General exam: NAD.  Appears weak and fatigued Respiratory system: Poor respiratory effort.  Normal work of breathing.  Room air Cardiovascular system: 1 S2, bradycardic, irregular rhythm, no murmurs, no pedal edema Gastrointestinal system: Soft, NT/ND, normal bowel sounds Central nervous system: Alert and oriented. No focal neurological deficits. Extremities: Decreased power bilateral lower extremities Skin: No rashes, lesions or ulcers Psychiatry: Judgement and insight appear impaired. Mood & affect depressed.     Data Reviewed: I have personally reviewed following labs and imaging studies  CBC: Recent Labs  Lab 09/12/23 1745 09/13/23 0353 09/14/23 0343 09/15/23 0640  WBC 23.2* 21.5* 17.4* 14.5*  NEUTROABS 19.9*  --   --   --   HGB 14.1 12.2 11.6* 11.5*  HCT 41.7 37.1 35.2* 33.9*  MCV 87.8 89.4 88.9 88.3  PLT 273 230 229 231   Basic Metabolic Panel: Recent Labs  Lab 09/12/23 1745 09/13/23 0353 09/14/23 0343 09/15/23 0640  NA 135 136 137 137  K 3.5 2.8* 3.7 4.8  CL 98 99 102 105  CO2 24 23 23 23   GLUCOSE 120* 92 124* 110*  BUN 22 19 30* 24*  CREATININE 0.64 0.51 0.47 0.57  CALCIUM  8.9 7.9* 8.3* 8.5*  MG 1.9  --  1.8 2.4   GFR: Estimated Creatinine Clearance: 43.1 mL/min (by C-G formula based on SCr of 0.57  mg/dL). Liver Function Tests: Recent Labs  Lab 09/12/23 1745  AST 20  ALT 12  ALKPHOS 87  BILITOT 1.0  PROT 7.2  ALBUMIN 3.3*   No results for input(s): LIPASE, AMYLASE in the last 168 hours. No results for input(s): AMMONIA in the last 168 hours. Coagulation Profile: Recent Labs  Lab 09/12/23 2322  INR 1.3*   Cardiac Enzymes: No results for input(s): CKTOTAL, CKMB, CKMBINDEX, TROPONINI in the last 168 hours. BNP (last 3 results) No results for input(s): PROBNP in the last 8760 hours. HbA1C: No results for input(s): HGBA1C in the last 72 hours. CBG: No results for input(s): GLUCAP in the last 168 hours. Lipid Profile: No results for input(s): CHOL, HDL, LDLCALC, TRIG, CHOLHDL, LDLDIRECT in the last 72 hours. Thyroid  Function Tests: Recent Labs    09/13/23 0103  TSH 3.407   Anemia Panel: No results for input(s): VITAMINB12, FOLATE, FERRITIN, TIBC, IRON , RETICCTPCT in  the last 72 hours. Sepsis Labs: No results for input(s): PROCALCITON, LATICACIDVEN in the last 168 hours.  No results found for this or any previous visit (from the past 240 hours).       Radiology Studies: ECHOCARDIOGRAM COMPLETE Result Date: 09/15/2023    ECHOCARDIOGRAM REPORT   Patient Name:   [name] [facility] Date of Exam: 09/15/2023 Medical Rec #:  [id number]     Height:       62.0 in Accession #:    [id number]    Weight:       137.8 lb Date of Birth:  Mar 31, 1933     BSA:          1.632 m Patient Age:    89 years      BP:           167/48 mmHg Patient Gender: F             HR:           42 bpm. Exam Location:  [facility] Procedure: 2D Echo, Cardiac Doppler and Color Doppler (Both Spectral and Color            Flow Doppler were utilized during procedure). Indications:     Abnormal ECG R94.31  History:         Patient has prior history of Echocardiogram examinations, most                  recent 05/19/2023. Arrythmias:Atrial Fibrillation; Risk                   Factors:Hypertension.  Sonographer:     Christopher Furnace Referring Phys:  [id number] GABRIELLA DECOSTE Diagnosing Phys: Keller Alluri IMPRESSIONS  1. Left ventricular ejection fraction, by estimation, is 70 to 75%. The left ventricle has hyperdynamic function. The left ventricle has no regional wall motion abnormalities. There is mild left ventricular hypertrophy. Left ventricular diastolic parameters are indeterminate.  2. Right ventricular systolic function is normal. The right ventricular size is normal. There is severely elevated pulmonary artery systolic pressure. The estimated right ventricular systolic pressure is 63.7 mmHg.  3. Trivial mitral valve regurgitation. Mild mitral stenosis. Severe mitral annular calcification.  4. The aortic valve is tricuspid. Aortic valve regurgitation is not visualized. FINDINGS  Left Ventricle: Left ventricular ejection fraction, by estimation, is 70 to 75%. The left ventricle has hyperdynamic function. The left ventricle has no regional wall motion abnormalities. The left ventricular internal cavity size was normal in size. There is mild left ventricular hypertrophy. Left ventricular diastolic parameters are indeterminate. Right Ventricle: The right ventricular size is normal. No increase in right ventricular wall thickness. Right ventricular systolic function is normal. There is severely elevated pulmonary artery systolic pressure. The tricuspid regurgitant velocity is 3.83 m/s, and with an assumed right atrial pressure of 5 mmHg, the estimated right ventricular systolic pressure is 63.7 mmHg. Left Atrium: Left atrial size was normal in size. Right Atrium: Right atrial size was normal in size. Pericardium: There is no evidence of pericardial effusion. Mitral Valve: There is mild thickening of the mitral valve leaflet(s). Severe mitral annular calcification. Trivial mitral valve regurgitation. Mild mitral valve stenosis. MV peak gradient, 16.5 mmHg. The mean mitral valve gradient is  6.0 mmHg. Tricuspid Valve: The tricuspid valve is normal in structure. Tricuspid valve regurgitation is mild. Aortic Valve: The aortic valve is tricuspid. Aortic valve regurgitation is not visualized. Aortic valve mean gradient measures 5.0 mmHg. Aortic valve peak gradient measures 7.8 mmHg. Aortic valve  area, by VTI measures 1.81 cm. Pulmonic Valve: The pulmonic valve was not well visualized. Pulmonic valve regurgitation is trivial. Aorta: The aortic root and ascending aorta are structurally normal, with no evidence of dilitation. Venous: The inferior vena cava was not well visualized. IAS/Shunts: The atrial septum is grossly normal. Additional Comments: There is pleural effusion in the left lateral region.  LEFT VENTRICLE PLAX 2D LVIDd:         3.50 cm   Diastology LVIDs:         2.60 cm   LV e' medial:    4.68 cm/s LV PW:         1.10 cm   LV E/e' medial:  31.6 LV IVS:        1.30 cm   LV e' lateral:   6.74 cm/s LVOT diam:     2.00 cm   LV E/e' lateral: 22.0 LV SV:         54 LV SV Index:   33 LVOT Area:     3.14 cm  RIGHT VENTRICLE RV Basal diam:  2.80 cm RV Mid diam:    2.70 cm LEFT ATRIUM             Index        RIGHT ATRIUM           Index LA diam:        4.40 cm 2.70 cm/m   RA Area:     15.30 cm LA Vol (A2C):   32.7 ml 20.04 ml/m  RA Volume:   40.60 ml  24.88 ml/m LA Vol (A4C):   39.2 ml 24.02 ml/m LA Biplane Vol: 36.2 ml 22.19 ml/m  AORTIC VALVE AV Area (Vmax):    1.61 cm AV Area (Vmean):   1.50 cm AV Area (VTI):     1.81 cm AV Vmax:           140.00 cm/s AV Vmean:          102.350 cm/s AV VTI:            0.298 m AV Peak Grad:      7.8 mmHg AV Mean Grad:      5.0 mmHg LVOT Vmax:         71.90 cm/s LVOT Vmean:        48.900 cm/s LVOT VTI:          0.171 m LVOT/AV VTI ratio: 0.57  AORTA Ao Root diam: 2.50 cm MITRAL VALVE                TRICUSPID VALVE MV Area (PHT): 2.36 cm     TR Peak grad:   58.7 mmHg MV Area VTI:   0.78 cm     TR Vmax:        383.00 cm/s MV Peak grad:  16.5 mmHg MV Mean grad:   6.0 mmHg     SHUNTS MV Vmax:       2.03 m/s     Systemic VTI:  0.17 m MV Vmean:      113.0 cm/s   Systemic Diam: 2.00 cm MV Decel Time: 322 msec MV E velocity: 148.00 cm/s MV A velocity: 121.00 cm/s MV E/A ratio:  1.22 Keller Alluri Electronically signed by Keller Paterson Signature Date/Time: 09/15/2023/1:31:34 PM    Final         Scheduled Meds:  budesonide -glycopyrrolate -formoterol   2 puff Inhalation BID   feeding supplement  237 mL Oral BID BM   folic  acid  1 mg Oral Daily   LORazepam   0.5 mg Oral QHS   losartan   25 mg Oral Daily   metoCLOPramide   10 mg Oral TID AC   mirtazapine   15 mg Oral QHS   multivitamin with minerals  1 tablet Oral Daily   pantoprazole   40 mg Oral Daily   Continuous Infusions:  heparin  950 Units/hr (09/15/23 0538)     LOS: 3 days      Olivia KATHEE Robson, MD [facility]   If 7PM-7AM, please contact night-coverage  09/15/2023, 1:32 PM

## 2023-09-15 NOTE — Progress Notes (Signed)
 Patient is one month out from a cannulated hip pinning for a right valgus impacted femoral neck fracture.  Honeycomb dressing was removed and skin examination showed a well healing surgical incision without any signs for infection.  Mild ecchymosis still present.  Staples were removed without issue.  Steri-strips applied to the right hip.  She can leave uncovered and can shower and get the incision wet at this time.  Will place an order for a right hip x-ray to check on the surgical fixation.  If x-rays demonstrate no shifting of the fracture or hardware will change her weightbearing to WBAT.  Follow-up with [facility] in 4 weeks.  DOROTHA Gustavo Level, PA-C, CAQ-OS [facility]

## 2023-09-15 NOTE — Progress Notes (Signed)
 PHARMACY - ANTICOAGULATION CONSULT NOTE  Pharmacy Consult for heparin  Indication: atrial fibrillation  Allergies  Allergen Reactions   Amoxicillin  Nausea Only   Eliquis  [Apixaban ] Nausea Only    Patient Measurements: Height: 5' 2 (157.5 cm) Weight: 62.5 kg (137 lb 12.6 oz) IBW/kg (Calculated) : 50.1 HEPARIN  DW (KG): 58.9  Vital Signs: Temp: 97.7 F (36.5 C) (07/11 0354) Temp Source: Oral (07/11 0354) BP: 157/47 (07/11 0354) Pulse Rate: 42 (07/11 0354)  Labs: Recent Labs    09/12/23 1856 09/12/23 2322 09/13/23 0103 09/13/23 0353 09/13/23 1329 09/14/23 0343 09/14/23 0541 09/15/23 0640  HGB  --   --   --  12.2  --  11.6*  --  11.5*  HCT  --   --   --  37.1  --  35.2*  --  33.9*  PLT  --   --   --  230  --  229  --  231  APTT  --  51*  --   --   --   --   --   --   LABPROT  --  17.0*  --   --   --   --   --   --   INR  --  1.3*  --   --   --   --   --   --   HEPARINUNFRC  --   --   --  0.20*   < > >1.10* 0.42 0.44  CREATININE  --   --   --  0.51  --  0.47  --  0.57  TROPONINIHS 20* 16 22*  --   --   --   --   --    < > = values in this interval not displayed.    Estimated Creatinine Clearance: 43.1 mL/min (by C-G formula based on SCr of 0.57 mg/dL).   Medical History: Past Medical History:  Diagnosis Date   Acquired thrombophilia (HCC)    Acute metabolic encephalopathy 06/19/2020   Allergy    Atherosclerosis of aorta (HCC)    Atrial fibrillation (HCC)    Bronchiectasis (HCC)    Cancer (HCC)    left breast cancer    Chronic nausea    COPD (chronic obstructive pulmonary disease) (HCC)    GERD (gastroesophageal reflux disease)    Grade II diastolic dysfunction    Hyperlipemia    Hypertension    Intractable nausea and vomiting 06/20/2018   Mild concentric left ventricular hypertrophy (LVH)    Mild mitral stenosis by prior echocardiogram    Mild pulmonary hypertension (HCC)    Psoriatic arthritis (HCC)    Stage 2 moderate COPD by GOLD classification  (HCC)     Medications:  Scheduled:   budesonide -glycopyrrolate -formoterol   2 puff Inhalation BID   feeding supplement  237 mL Oral BID BM   folic acid   1 mg Oral Daily   LORazepam   0.5 mg Oral QHS   losartan   25 mg Oral Daily   metoCLOPramide  (REGLAN ) injection  10 mg Intravenous Q8H   mirtazapine   15 mg Oral QHS   multivitamin with minerals  1 tablet Oral Daily   pantoprazole  (PROTONIX ) IV  40 mg Intravenous Q24H   Infusions:   heparin  950 Units/hr (09/15/23 0538)   PRN: acetaminophen , albuterol , atropine , dextromethorphan-guaiFENesin , diphenhydrAMINE , docusate sodium , hydrALAZINE , HYDROcodone -acetaminophen , ondansetron  (ZOFRAN ) IV  Assessment: 89 year old female admitted for complete heart block receiving anticoagulation for atrial fibrillation. Anticoagulated with dabigatran  at home. Notable PMH femur fracture (08/2023), breast  cancer, HFpEF, psoriatic arthritis, bronchiectasis. CHAD-VASC score of 5. CBC stable with Hemoglobin 11.5 g/dL and platelet 768 K/uL. Renal function good given her age with Scr 0.57 (CrCL 43.1 mL/min).   Heparin  level today (7/11) of 0.44 within goal range x 4. Patient continues to receive 950 units/hr infusion.   Goal of Therapy:  Heparin  level 0.3-0.7 units/ml Monitor platelets by anticoagulation protocol: Yes   Plan:  Continue heparin  infusion of 950 units/hr. Continue to monitor heparin  level, hemoglobin, and platelets daily.   Breeonna Mone [place] - PharmD Candidate 09/15/2023,7:27 AM

## 2023-09-15 NOTE — Progress Notes (Signed)
 PT Cancellation Note  Patient Details Name: [name] MRN: [id number] DOB: 11/10/1933   Cancelled Treatment:    Reason Eval/Treat Not Completed: Other (comment). Pt up in chair with echo at bedside, PT to re-attempt as able.    Doyal Shams PT, DPT 10:45 AM,09/15/23

## 2023-09-15 NOTE — Progress Notes (Signed)
 Occupational Therapy Evaluation Patient Details Name: [name] MRN: [id number] DOB: 1933/03/15 Today's Date: 09/15/2023   History of Present Illness   Pt is an 89 y.o. female  with past medical history of HTN, HLD, HFpEF, A-fib on Pradaxa , COPD, bronchiectasis, GERD, breast cancer s/p of a left lumpectomy), Psoriatec arthritis, mild pulmonary hypertension, recent right hip fracture  admitted on 09/12/2023 with weakness and poor PO intake, workup for complete heart block and symptomatic bradycardia, afib, COPD.     Clinical Impressions [name] was seen for OT evaluation this date. Prior to hospital admission, pt was improving in independence with ADLs after recent hip fx, however, husband reports pt needing more assistance of late due to weakness. Pt lives w/ spouse. Pt presents to acute OT demonstrating impaired ADL performance and functional mobility 2/2 generalized weakness and decreased activity tolerance (See OT problem list for additional functional deficits). Pt currently requires MIN A +2 physical assist for t/f from bed>BSC>chair; MAX A for pericare in standing. HR remained in high 40's, SpO2 > 90% on RA throughout session.  Pt would benefit from skilled OT services to address noted impairments and functional limitations (see below for any additional details) in order to maximize safety and independence while minimizing falls risk and caregiver burden. Anticipate the need for follow up OT services upon acute hospital DC.      If plan is discharge home, recommend the following:   Assist for transportation;Help with stairs or ramp for entrance;Two people to help with walking and/or transfers;Two people to help with bathing/dressing/bathroom     Functional Status Assessment   Patient has had a recent decline in their functional status and demonstrates the ability to make significant improvements in function in a reasonable and predictable amount of time.     Equipment  Recommendations   Other (comment) (defer to next venue of care)     Recommendations for Other Services         Precautions/Restrictions   Precautions Precautions: Fall Recall of Precautions/Restrictions: Intact Restrictions Weight Bearing Restrictions Per Provider Order: Yes RLE Weight Bearing Per Provider Order: Partial weight bearing RLE Partial Weight Bearing Percentage or Pounds: 50%     Mobility Bed Mobility Overal bed mobility: Needs Assistance Bed Mobility: Supine to Sit     Supine to sit: Mod assist          Transfers Overall transfer level: Needs assistance Equipment used: Rolling walker (2 wheels) Transfers: Sit to/from Stand, Bed to chair/wheelchair/BSC Sit to Stand: Min assist, +2 physical assistance     Step pivot transfers: Min assist, +2 physical assistance            Balance Overall balance assessment: Needs assistance Sitting-balance support: Feet supported, No upper extremity supported Sitting balance-Leahy Scale: Fair     Standing balance support: Bilateral upper extremity supported, Reliant on assistive device for balance Standing balance-Leahy Scale: Poor                             ADL either performed or assessed with clinical judgement   ADL Overall ADL's : Needs assistance/impaired                                       General ADL Comments: MIN A +2 for BSC t/f; MAX A +2 for standing pericare     Vision  Perception         Praxis         Pertinent Vitals/Pain Pain Assessment Pain Assessment: Faces Faces Pain Scale: No hurt     Extremity/Trunk Assessment Upper Extremity Assessment Upper Extremity Assessment: Generalized weakness   Lower Extremity Assessment Lower Extremity Assessment: Generalized weakness       Communication Communication Communication: No apparent difficulties   Cognition Arousal: Alert Behavior During Therapy: WFL for tasks  assessed/performed Cognition: No apparent impairments                               Following commands: Intact       Cueing  General Comments   Cueing Techniques: Verbal cues;Gestural cues  HR remained in high 40's, SpO2 > 90% on RA; noted small skin abrasion on spine notified RN   Exercises     Shoulder Instructions      Home Living Family/patient expects to be discharged to:: Private residence Living Arrangements: Spouse/significant other Available Help at Discharge: Family;Available 24 hours/day Type of Home: House Home Access: Stairs to enter [employer] of Steps: 5 Entrance Stairs-Rails: Right;Left;Can reach both Home Layout: Able to live on main level with bedroom/bathroom;Two level     Bathroom Shower/Tub: Walk-in Pensions consultant: Handicapped height Bathroom Accessibility: Yes   Home Equipment: Grab bars - toilet;Grab bars - tub/shower;Cane - single Librarian, academic (2 wheels);Transport chair;Shower seat - built in          Prior Functioning/Environment Prior Level of Function : Needs assist             Mobility Comments: home for two weeks from SNF, due to progressive weakness needed assistance with all mobility ADLs Comments: Husband reports pt needed help w/ ADLs due to weakness    OT Problem List: Decreased strength;Decreased activity tolerance;Impaired balance (sitting and/or standing);Cardiopulmonary status limiting activity   OT Treatment/Interventions: Self-care/ADL training;Energy conservation;Patient/family education      OT Goals(Current goals can be found in the care plan section)   Acute Rehab OT Goals Patient Stated Goal: to feel stronger OT Goal Formulation: With patient Time For Goal Achievement: 09/29/23 Potential to Achieve Goals: Good ADL Goals Pt Will Perform Grooming: sitting;standing;with set-up Pt Will Perform Lower Body Dressing: sitting/lateral leans;sit to/from stand;with  supervision Pt Will Transfer to Toilet: with contact guard assist;stand pivot transfer;bedside commode   OT Frequency:  Min 2X/week    Co-evaluation              AM-PAC OT 6 Clicks Daily Activity     Outcome Measure Help from another person eating meals?: None Help from another person taking care of personal grooming?: None Help from another person toileting, which includes using toliet, bedpan, or urinal?: A Lot Help from another person bathing (including washing, rinsing, drying)?: A Lot Help from another person to put on and taking off regular upper body clothing?: A Little Help from another person to put on and taking off regular lower body clothing?: A Lot 6 Click Score: 17   End of Session Equipment Utilized During Treatment: Gait belt;Rolling walker (2 wheels) Nurse Communication: Other (comment) (skin abrasion and pt left on RA)  Activity Tolerance: Patient limited by fatigue Patient left: in chair;with call bell/phone within reach;with chair alarm set;with family/visitor present  OT Visit Diagnosis: Unsteadiness on feet (R26.81);Other abnormalities of gait and mobility (R26.89);Muscle weakness (generalized) (M62.81)  Time: 9092-9055 OT Time Calculation (min): 37 min Charges:  OT General Charges $OT Visit: 1 Visit OT Evaluation $OT Eval Moderate Complexity: 1 Mod OT Treatments $Self Care/Home Management : 23-37 mins  [place], Student OT   [employer] 09/15/2023, 1:05 PM

## 2023-09-15 NOTE — Progress Notes (Signed)
 [facility] CLINIC CARDIOLOGY PROGRESS NOTE       Patient ID: [name] MRN: [id number] DOB/AGE: Jul 03, 1933 89 y.o.  Admit date: 09/12/2023 Referring Physician Dr. Hilma Primary Physician Cleotilde Oneil FALCON, MD Primary Cardiologist Tinnie Maiden, NP; Dr. Dewane Reason for Consultation complete heart block  HPI: [name] is a 89 y.o. female  with a past medical history of history bradycardia, hypokalemia, paroxysmal atrial fibrillation (on Pradaxa ), chronic HFpEF, mild pulmonary hypertension, hypertension, hyperlipidemia, pulmonary fibrosis, COPD who presented to the ED on 09/12/2023 for worsening generalized weakness and poor intake. Of note patient had a recent fall that resulted in right hip fracture and 08/16/2023 and recently got out of rehab about a week ago.  Heart rates were found to be in the 40s.  Cardiology was consulted for further evaluation.   Interval History: -Patient seen and examined this AM and laying comfortably in hospital bed. Patient states she feels okay and denies chest pain, lightheadedness or dizziness.  -Patient continues to refuse to take medications and eat/drink water due to nausea/dry heaving.  -Patients BP elevated and HR has remained stable in 40s. Per tele remains in complete heart block. -Explained to patient this AM that our recommendation is for PPM, she continues to decline. No family present at the time of evaluation this AM.  -Patient remains on 2L with stable SpO2.  -Palliative following, appreciate their help with GOC conversations with patient and family.   Pertinent Cardiac History (Most recent) 3 day Holter monitor:08/01/2023 to 08/04/2023 Predominant rhythm is sinus rhythm. Maximum heart rate 141 bpm, minimum heart rate 57 bpm the average heart rate of 74 bpm Occasional less than 1% PACs and rare PVCs noted 8 occurrences of short lasting supraventricular tachycardia, longest episode 17 beats 0 patient triggers  Impression: Predominant rhythm  is sinus rhythm. No significant arrhythmias.   Review of systems complete and found to be negative unless listed above    Past Medical History:  Diagnosis Date   Acquired thrombophilia (HCC)    Acute metabolic encephalopathy 06/19/2020   Allergy    Atherosclerosis of aorta (HCC)    Atrial fibrillation (HCC)    Bronchiectasis (HCC)    Cancer (HCC)    left breast cancer    Chronic nausea    COPD (chronic obstructive pulmonary disease) (HCC)    GERD (gastroesophageal reflux disease)    Grade II diastolic dysfunction    Hyperlipemia    Hypertension    Intractable nausea and vomiting 06/20/2018   Mild concentric left ventricular hypertrophy (LVH)    Mild mitral stenosis by prior echocardiogram    Mild pulmonary hypertension (HCC)    Psoriatic arthritis (HCC)    Stage 2 moderate COPD by GOLD classification [facility])     Past Surgical History:  Procedure Laterality Date   BREAST LUMPECTOMY Left 2008   CATARACT EXTRACTION W/PHACO Left 02/13/2023   Procedure: CATARACT EXTRACTION PHACO AND INTRAOCULAR LENS PLACEMENT (IOC) LEFT  KDB GONIOTOMY;  Surgeon: Enola Feliciano Hugger, MD;  Location: [facility];  Service: Ophthalmology;  Laterality: Left;  10.53 0:55.   CATARACT EXTRACTION W/PHACO Right 02/27/2023   Procedure: CATARACT EXTRACTION PHACO AND INTRAOCULAR LENS PLACEMENT (IOC) RIGHT  [place], goniotomy 15.90 01:28.6;  Surgeon: Enola Feliciano Hugger, MD;  Location: [facility];  Service: Ophthalmology;  Laterality: Right;   CHOLECYSTECTOMY     HIP PINNING,CANNULATED Left 06/05/2020   Procedure: CANNULATED HIP PINNING;  Surgeon: Kathlynn Sharper, MD;  Location: [facility];  Service: Orthopedics;  Laterality: Left;  HIP PINNING,CANNULATED Right 08/16/2023   Procedure: FIXATION, FEMUR, NECK, PERCUTANEOUS, USING SCREW;  Surgeon: Edie Norleen PARAS, MD;  Location: [facility];  Service: Orthopedics;  Laterality: Right;   PARTIAL HYSTERECTOMY     TEE WITHOUT CARDIOVERSION N/A 06/10/2020    Procedure: TRANSESOPHAGEAL ECHOCARDIOGRAM (TEE);  Surgeon: Darliss Rogue, MD;  Location: [facility];  Service: Cardiovascular;  Laterality: N/A;    Medications Prior to Admission  Medication Sig Dispense Refill Last Dose/Taking   amiodarone  (PACERONE ) 200 MG tablet Take 1 tablet (200 mg total) by mouth daily. 30 tablet 0 09/12/2023 Morning   dabigatran  (PRADAXA ) 150 MG CAPS capsule Take 1 capsule (150 mg total) by mouth every 12 (twelve) hours. 60 capsule 0 09/12/2023 at  8:00 AM   docusate sodium  (COLACE) 100 MG capsule Take 1 capsule (100 mg total) by mouth 2 (two) times daily. 10 capsule 0 Past Month   Fluticasone -Umeclidin-Vilant (TRELEGY ELLIPTA) 100-62.5-25 MCG/ACT AEPB Inhale 1 puff into the lungs daily.   09/12/2023 Morning   folic acid  (FOLVITE ) 1 MG tablet Take 1 mg by mouth daily. Pt takes this med the days she does not take methotrexate    09/12/2023 Morning   hydrochlorothiazide  (HYDRODIURIL ) 25 MG tablet Take 1 tablet by mouth daily.   09/12/2023 Morning   HYDROcodone -acetaminophen  (NORCO/VICODIN) 5-325 MG tablet Take 1-2 tablets by mouth every 4 (four) hours as needed for moderate pain (pain score 4-6). 30 tablet 0 09/12/2023   LORazepam  (ATIVAN ) 0.5 MG tablet Take 1 tablet (0.5 mg total) by mouth at bedtime. 10 tablet 0 09/12/2023   methotrexate  (RHEUMATREX) 2.5 MG tablet Take 5 mg by mouth once a week. Caution:Chemotherapy. Protect from light. Pt take 2 weekly.   09/10/2023   metoCLOPramide  (REGLAN ) 10 MG tablet Take 1 tablet (10 mg total) by mouth 4 (four) times daily -  before meals and at bedtime. 120 tablet 0 09/12/2023 Morning   metoprolol  tartrate (LOPRESSOR ) 25 MG tablet Take 1.5 tablets (37.5 mg total) by mouth 2 (two) times daily. 90 tablet 0 Past Week   omeprazole  (PRILOSEC) 40 MG capsule Take 40 mg by mouth daily.   09/12/2023 Morning   ondansetron  (ZOFRAN ) 4 MG tablet Take 4 mg by mouth every 8 (eight) hours as needed for nausea or vomiting.   Unknown   [Paused] furosemide  (LASIX ) 40 MG  tablet Take 1 tablet (40 mg total) by mouth daily. 30 tablet 0    Social History   Socioeconomic History   Marital status: Married    Spouse name: Not on file   Number of children: 2   Years of education: Not on file   Highest education level: Not on file  Occupational History   Not on file  Tobacco Use   Smoking status: Never   Smokeless tobacco: Never  Vaping Use   Vaping status: Never Used  Substance and Sexual Activity   Alcohol use: No   Drug use: No   Sexual activity: Not on file  Other Topics Concern   Not on file  Social History Narrative   Not on file   Social Drivers of Health   Financial Resource Strain: Low Risk  (08/10/2023)   Received from [facility]   Overall Financial Resource Strain (CARDIA)    Difficulty of Paying Living Expenses: Not hard at all  Food Insecurity: No Food Insecurity (09/14/2023)   Hunger Vital Sign    Worried About Running Out of Food in the Last Year: Never true    Ran Out of  Food in the Last Year: Never true  Transportation Needs: No Transportation Needs (09/14/2023)   PRAPARE - Administrator, Civil Service (Medical): No    Lack of Transportation (Non-Medical): No  Physical Activity: Sufficiently Active (02/17/2021)   Exercise Vital Sign    Days of Exercise per Week: 7 days    Minutes of Exercise per Session: 30 min  Stress: No Stress Concern Present (02/17/2021)   [employer] of Occupational Health - Occupational Stress Questionnaire    Feeling of Stress : Not at all  Social Connections: Moderately Isolated (09/14/2023)   Social Connection and Isolation Panel    Frequency of Communication with Friends and Family: More than three times a week    Frequency of Social Gatherings with Friends and Family: Three times a week    Attends Religious Services: Never    Active Member of Clubs or Organizations: No    Attends Banker Meetings: Never    Marital Status: Married  Catering manager  Violence: Not At Risk (09/14/2023)   Humiliation, Afraid, Rape, and Kick questionnaire    Fear of Current or Ex-Partner: No    Emotionally Abused: No    Physically Abused: No    Sexually Abused: No    Family History  Problem Relation Age of Onset   Breast cancer Sister      Vitals:   09/14/23 2339 09/15/23 0354 09/15/23 0500 09/15/23 0733  BP: (!) 154/50 (!) 157/47  (!) 167/48  Pulse: (!) 44 (!) 42  (!) 42  Resp: 17 17    Temp: 98.3 F (36.8 C) 97.7 F (36.5 C)  98.3 F (36.8 C)  TempSrc:  Oral    SpO2: 96% 98%  99%  Weight:   62.5 kg   Height:        PHYSICAL EXAM General: Chronically ill-appearing elderly female, well nourished, in no acute distress. HEENT: Normocephalic and atraumatic. Neck: No JVD.   Lungs: Normal respiratory effort on room air. Clear bilaterally to auscultation. No wheezes, crackles, rhonchi.  Heart: HRR, slow rates. Normal S1 and S2 without gallops or murmurs.  Abdomen: Non-distended appearing.  Msk: Normal strength and tone for age. Extremities: Warm and well perfused. No clubbing, cyanosis, edema.  Neuro: Alert and oriented X 3. Psych: Answers questions appropriately.   Labs: Basic Metabolic Panel: Recent Labs    09/14/23 0343 09/15/23 0640  NA 137 137  K 3.7 4.8  CL 102 105  CO2 23 23  GLUCOSE 124* 110*  BUN 30* 24*  CREATININE 0.47 0.57  CALCIUM  8.3* 8.5*  MG 1.8 2.4   Liver Function Tests: Recent Labs    09/12/23 1745  AST 20  ALT 12  ALKPHOS 87  BILITOT 1.0  PROT 7.2  ALBUMIN 3.3*   No results for input(s): LIPASE, AMYLASE in the last 72 hours. CBC: Recent Labs    09/12/23 1745 09/13/23 0353 09/14/23 0343 09/15/23 0640  WBC 23.2*   < > 17.4* 14.5*  NEUTROABS 19.9*  --   --   --   HGB 14.1   < > 11.6* 11.5*  HCT 41.7   < > 35.2* 33.9*  MCV 87.8   < > 88.9 88.3  PLT 273   < > 229 231   < > = values in this interval not displayed.   Cardiac Enzymes: Recent Labs    09/12/23 1856 09/12/23 2322  09/13/23 0103  TROPONINIHS 20* 16 22*   BNP: Recent Labs  09/12/23 1745  BNP 776.7*   D-Dimer: No results for input(s): DDIMER in the last 72 hours. Hemoglobin A1C: No results for input(s): HGBA1C in the last 72 hours. Fasting Lipid Panel: No results for input(s): CHOL, HDL, LDLCALC, TRIG, CHOLHDL, LDLDIRECT in the last 72 hours. Thyroid  Function Tests: Recent Labs    09/13/23 0103  TSH 3.407   Anemia Panel: No results for input(s): VITAMINB12, FOLATE, FERRITIN, TIBC, IRON , RETICCTPCT in the last 72 hours.   Radiology: DG Chest Portable 1 View Result Date: 09/12/2023 CLINICAL DATA:  Weakness, shortness of breath EXAM: PORTABLE CHEST 1 VIEW COMPARISON:  07/18/2023 FINDINGS: Heart and mediastinal contours within normal limits. Aortic atherosclerosis. Right lung clear. Patchy opacity in the left lower lung again noted, improved since prior study. No visible effusions or acute bony abnormality. Biapical scarring. IMPRESSION: Persistent but improving left basilar atelectasis or infiltrate. No visible effusions. Electronically Signed   By: Franky Crease M.D.   On: 09/12/2023 19:30   DG HIP UNILAT WITH PELVIS 2-3 VIEWS RIGHT Result Date: 08/16/2023 CLINICAL DATA:  Elective surgery.  Right hip. EXAM: DG HIP (WITH OR WITHOUT PELVIS) 2-3V RIGHT COMPARISON:  Pelvis and right hip radiographs 08/16/2023 and CT right hip 08/16/2023 FINDINGS: Images were performed intraoperatively without the presence of a radiologist. The patient is undergoing ORIF of the previously seen subcapital proximal right femoral fracture with 3 longitudinal screws. Total fluoroscopy images: 5 Total fluoroscopy time: 34 seconds Total dose: Radiation Exposure Index (as provided by the fluoroscopic device): 5.1 mGy air Kerma Please see intraoperative findings for further detail. IMPRESSION: Intraoperative fluoroscopy for ORIF of the proximal right femoral fracture. Electronically Signed   By: Tanda Lyons M.D.   On: 08/16/2023 17:46   DG C-Arm 1-60 Min-No Report Result Date: 08/16/2023 Fluoroscopy was utilized by the requesting physician.  No radiographic interpretation.   CT Hip Right Wo Contrast Result Date: 08/16/2023 CLINICAL DATA:  Right hip pain after fall. EXAM: CT OF THE RIGHT HIP WITHOUT CONTRAST TECHNIQUE: Multidetector CT imaging of the right hip was performed according to the standard protocol. Multiplanar CT image reconstructions were also generated. RADIATION DOSE REDUCTION: This exam was performed according to the departmental dose-optimization program which includes automated exposure control, adjustment of the mA and/or kV according to patient size and/or use of iterative reconstruction technique. COMPARISON:  Same day radiographs of the right hip dated 08/16/2023. FINDINGS: Bones/Joint/Cartilage Subtle region of cortical and trabecular lucency along the posterior femoral head and neck junction (series 7, image 60), may reflect a nondisplaced fracture. The remainder of the visualized bones are intact. The right femoral head is seated within the acetabulum. Moderate osteoarthritis of the right hip manifested by joint space narrowing and marginal osteophytosis. Ligaments Ligaments are suboptimally evaluated by CT. Muscles and Tendons Muscles are within normal limits. No intramuscular fluid collection or hematoma. Soft tissue No fluid collection or hematoma. No soft tissue mass. Small fat containing right inguinal hernia. IMPRESSION: 1. Subtle region of cortical and trabecular lucency along the right posterior femoral head and neck junction may reflect a nondisplaced fracture. The remainder of the visualized bones are intact 2. Moderate osteoarthritis of the right hip. Electronically Signed   By: Harrietta Sherry M.D.   On: 08/16/2023 12:33   DG Hip Unilat W or Wo Pelvis 2-3 Views Right Result Date: 08/16/2023 CLINICAL DATA:  Right hip pain after fall today. EXAM: DG HIP (WITH OR WITHOUT  PELVIS) 2-3V RIGHT COMPARISON:  None Available. FINDINGS: There is no evidence  of hip fracture or dislocation. There is no evidence of arthropathy or other focal bone abnormality. IMPRESSION: Negative. Electronically Signed   By: Lynwood Landy Raddle M.D.   On: 08/16/2023 11:16   CT Head Wo Contrast Result Date: 08/16/2023 CLINICAL DATA:  Trauma, fall, fell off of stool and hit right-side of head. EXAM: CT HEAD WITHOUT CONTRAST CT CERVICAL SPINE WITHOUT CONTRAST TECHNIQUE: Multidetector CT imaging of the head and cervical spine was performed following the standard protocol without intravenous contrast. Multiplanar CT image reconstructions of the cervical spine were also generated. RADIATION DOSE REDUCTION: This exam was performed according to the departmental dose-optimization program which includes automated exposure control, adjustment of the mA and/or kV according to patient size and/or use of iterative reconstruction technique. COMPARISON:  CT head 06/05/2020. FINDINGS: CT HEAD FINDINGS Brain: No acute intracranial hemorrhage. Redemonstrated remote lacunar infarct versus prominent perivascular space in the inferior right basal ganglia. Increased heterogeneity of the basal ganglia particularly on the left likely related to chronic microvascular changes. Nonspecific hypoattenuation in the periventricular and subcortical white matter favored to reflect chronic microvascular ischemic changes. Mild parenchymal volume loss. No edema, mass effect, or midline shift. The basilar cisterns are patent. Ventricles: The ventricles are normal. Vascular: Atherosclerotic calcifications of the carotid siphons and intracranial vertebral arteries. No hyperdense vessel. Skull: No acute or aggressive finding. Orbits: Orbits are symmetric. Sinuses: The visualized paranasal sinuses are clear. Other: Mastoid air cells are clear. CT CERVICAL SPINE FINDINGS Alignment: Cervical lordosis is maintained. Trace retrolisthesis of C4 on C5. No  facet subluxation or dislocation. Skull base and vertebrae: No compression fracture or displaced fracture in the cervical spine. No suspicious osseous lesion. Chronic appearing deformity of the medial left clavicle. Soft tissues and spinal canal: No prevertebral fluid or swelling. No visible canal hematoma. Disc levels: Intervertebral disc space narrowing greatest at C4-5. Disc osteophyte complexes at multiple levels. No high-grade osseous spinal canal stenosis. Facet arthrosis and uncovertebral hypertrophy throughout the cervical spine. Foraminal stenosis most pronounced on the right at C6-7. Upper chest: Biapical pleuroparenchymal scarring and pleural calcifications. Other: None IMPRESSION: No CT evidence of acute intracranial abnormality. No acute fracture or traumatic malalignment of the cervical spine. Chronic appearing deformity of the medial left clavicle. Redemonstrated chronic microvascular ischemic changes. Degenerative changes of the cervical spine as above. Electronically Signed   By: Donnice Mania M.D.   On: 08/16/2023 11:07   CT Cervical Spine Wo Contrast Result Date: 08/16/2023 CLINICAL DATA:  Trauma, fall, fell off of stool and hit right-side of head. EXAM: CT HEAD WITHOUT CONTRAST CT CERVICAL SPINE WITHOUT CONTRAST TECHNIQUE: Multidetector CT imaging of the head and cervical spine was performed following the standard protocol without intravenous contrast. Multiplanar CT image reconstructions of the cervical spine were also generated. RADIATION DOSE REDUCTION: This exam was performed according to the departmental dose-optimization program which includes automated exposure control, adjustment of the mA and/or kV according to patient size and/or use of iterative reconstruction technique. COMPARISON:  CT head 06/05/2020. FINDINGS: CT HEAD FINDINGS Brain: No acute intracranial hemorrhage. Redemonstrated remote lacunar infarct versus prominent perivascular space in the inferior right basal ganglia.  Increased heterogeneity of the basal ganglia particularly on the left likely related to chronic microvascular changes. Nonspecific hypoattenuation in the periventricular and subcortical white matter favored to reflect chronic microvascular ischemic changes. Mild parenchymal volume loss. No edema, mass effect, or midline shift. The basilar cisterns are patent. Ventricles: The ventricles are normal. Vascular: Atherosclerotic calcifications of the carotid siphons and intracranial  vertebral arteries. No hyperdense vessel. Skull: No acute or aggressive finding. Orbits: Orbits are symmetric. Sinuses: The visualized paranasal sinuses are clear. Other: Mastoid air cells are clear. CT CERVICAL SPINE FINDINGS Alignment: Cervical lordosis is maintained. Trace retrolisthesis of C4 on C5. No facet subluxation or dislocation. Skull base and vertebrae: No compression fracture or displaced fracture in the cervical spine. No suspicious osseous lesion. Chronic appearing deformity of the medial left clavicle. Soft tissues and spinal canal: No prevertebral fluid or swelling. No visible canal hematoma. Disc levels: Intervertebral disc space narrowing greatest at C4-5. Disc osteophyte complexes at multiple levels. No high-grade osseous spinal canal stenosis. Facet arthrosis and uncovertebral hypertrophy throughout the cervical spine. Foraminal stenosis most pronounced on the right at C6-7. Upper chest: Biapical pleuroparenchymal scarring and pleural calcifications. Other: None IMPRESSION: No CT evidence of acute intracranial abnormality. No acute fracture or traumatic malalignment of the cervical spine. Chronic appearing deformity of the medial left clavicle. Redemonstrated chronic microvascular ischemic changes. Degenerative changes of the cervical spine as above. Electronically Signed   By: Donnice Mania M.D.   On: 08/16/2023 11:07    ECHO pending. 05/19/2023 1. Left ventricular ejection fraction, by estimation, is 60 to 65%. The  left ventricle has normal function. The left ventricle has no regional wall motion abnormalities. There is mild left ventricular hypertrophy. Left ventricular diastolic parameters are consistent with Grade II diastolic dysfunction (pseudonormalization).   2. Right ventricular systolic function is normal. The right ventricular size is normal. There is mildly elevated pulmonary artery systolic pressure.   3. Left atrial size was mildly dilated.   4. The mitral valve is normal in structure. No evidence of mitral valve regurgitation. Mild mitral stenosis. The mean mitral valve gradient is 4.0 mmHg. Severe mitral annular calcification.   5. Tricuspid valve regurgitation is mild to moderate.   6. The aortic valve is normal in structure. Aortic valve regurgitation is not visualized. Aortic valve sclerosis is present, with no evidence of aortic valve stenosis.   7. The inferior vena cava is normal in size with greater than 50% respiratory variability, suggesting right atrial pressure of 3 mmHg.    TELEMETRY reviewed by me 09/15/2023: complete heart block, rate 40s   EKG reviewed by me:  complete heart block, rate 50 bpm with nonspecific ST-T wave changes  Data reviewed by me 09/15/2023: last 24h vitals tele labs imaging I/O hospitalist progress notes.  Principal Problem:   Complete heart block (HCC) Active Problems:   Psoriasis vulgaris   Primary hypertension   Bronchiectasis (HCC)   Chronic obstructive pulmonary disease (HCC)   Leukocytosis   (HFpEF) heart failure with preserved ejection fraction (HCC)   Psoriatic arthritis (HCC)   Myocardial injury   Atrial fibrillation, chronic (HCC)   Symptomatic bradycardia   Malnutrition of moderate degree   Anorexia   Nausea and vomiting    ASSESSMENT AND PLAN:  [name] is a 89 y.o. female  with a past medical history of history bradycardia, hypokalemia, paroxysmal atrial fibrillation (on Pradaxa ), chronic HFpEF, mild pulmonary hypertension,  hyperlipidemia, pulmonary fibrosis, COPD who presented to the ED on 09/12/2023 for worsening generalized weakness and poor intake.  Of note patient had a recent fall that resulted in right hip fracture and 08/16/2023 and recently got out of rehab about a week ago.  Heart rates were found to be in the 40s.  Cardiology was consulted for further evaluation.  # Complete heart block # History of bradycardia # Paroxysmal atrial fibrillation Patient  presents with worsening generalized weakness and poor intake for the last several weeks.  Per husband heart rates have been running in mid 40s for the last several weeks.  EKG in ED revealed high-grade AV block with rate 50.  Per telemetry rate remains in 40s.  TSH within normal limits.   -Echo pending. -Avoid all AVN blockers.  -Hold home metoprolol  and amiodarone . (Last taken on 07/08) -Monitor and replenish electrolytes for a goal K >4, Mag >2  -Continue heparin  infusion for stroke risk reduction. (Home Pradaxa  held) -On 7/9 GDC, PA discussed with patient, patient's husband and daughter regarding slow heart rates. Family states patient has HR in mid 40s for past several weeks. Per tele patient HR remains in 40s in complete heart block. Discussed holding home metoprolol  and amiodarone  and allowing medication to get out of system. Also discussed possibility of PPM if HR does not improve or worsen and that would require transfer to [facility] for PPM procedure. Family and patient agree with monitoring telemetry/HR for now to see if HR improves off of AVN blockers. On 07/09, 07/10, 07/11 patient states she doesn't want PPM.  -7/11 patient seen by myself and Dr. Wilburn and explained that our recommendation is for PPM as HR has not improved and she remains in CHB. She continues to decline PPM. No family present at time of evaluation today. -Palliative following to discuss GOC. On 07/09 per palliative patient endorsed DNR/DNI and no artificial intervention - no pacer, no  feeding tube. Discussions will continue with family and patient.  -Recommend adequate hydration and p.o. intake.  Recommend PT for deconditioning.  # Chronic HFpEF Patient without shortness of breath or LEE. Chest x-ray with no evidence of congestion or pleural effusions.  BNP elevated at 770.  -Continue to hold home PO lasix  40 mg daily as patient does not appear volume up and refusing to drink water.  -Will plan to optimize GDMT as BP and renal function allow.   # Hypertension # Hyperlipidemia # Demand ischemia Patient without chest pain. Troponins minimally elevated and flat 21 > 20 > 16 > 22.  EKG with high grade AV block, rate 50 bpm with nonspecific ST-T wave changes.  -D/c home hydrochlorothiazide  due to electrolyte abnormalities, hypokalemia. -Continue losartan  25 mg daily.  -Hold metoprolol  as stated above.  This patient's plan of care was discussed and created with Dr. Wilburn and he is in agreement.  Signed: Danita Bloch, PA-C  09/15/2023, 8:45 AM [facility]

## 2023-09-15 NOTE — Plan of Care (Signed)

## 2023-09-15 NOTE — Progress Notes (Signed)
 Palliative Care Progress Note, Assessment & Plan   Patient Name: [name]       Date: 09/15/2023 DOB: August 12, 1933  Age: 89 y.o. MRN#: [id number] Attending Physician: Jhonny Calvin NOVAK, MD Primary Care Physician: Cleotilde Oneil FALCON, MD Admit Date: 09/12/2023  Subjective: Patient is out of bed sitting in recliner.  She is awake, alert, oriented, and able to make her wishes known.  Echo tech is just completed echocardiogram.  Cardiology PA, patient's sister [name], and patient's husband [name] are present at bedside during my visit.  HPI: 89 y.o. female  with past medical history of HTN, HLD, HFpEF, A-fib on Pradaxa , COPD, bronchiectasis, GERD, breast cancer s/p of a left lumpectomy), Psoriatec arthritis, mild pulmonary hypertension, recent right hip fracture  admitted on 09/12/2023 with weakness and poor PO intake.   Patient is being treated for complete heart block with symptomatic bradycardia, A-fib, bronchiectasis, and COPD.    Cardiology and SLP have been consulted in addition to PMT to support patient with goals of care discussions.  Summary of counseling/coordination of care: Extensive chart review completed prior to meeting patient including labs, vital signs, imaging, progress notes, orders, and available advanced directive documents from current and previous encounters.   After reviewing the patient's chart and assessing the patient at bedside, I spoke with patient in regards to symptom management and goals of care.   Symptoms assessed.  Patient shares that she is feeling a little bit better today.  She endorses she ate 3 meals yesterday as well as a healthy breakfast this morning.  She endorses her appetite and energy are increasing.  Patient and family are in agreement to move forward with pacemaker  placement, which will happen on Monday at [facility].  Plan is to monitor patient over the weekend and place PPM on Monday.  Alongside cardiology PA, education provided on pacemaker.  No acute palliative needs at this time. Symptom burden is low.  Goals are clear.  PMT will step back from daily visits and monitor the patient peripherally over the weekend.  Please utilize all on-call providers for PMT should acute palliative needs arise over the weekend.  Otherwise, plan to follow-up with patient and family on Tuesday.   Physical Exam Vitals reviewed.  Constitutional:      General: She is not in acute distress.    Appearance: She is normal weight.  HENT:     Head: Normocephalic.     Nose: Nose normal.     Mouth/Throat:     Mouth: Mucous membranes are moist.  Pulmonary:     Effort: Pulmonary effort is normal.  Abdominal:     Palpations: Abdomen is soft.  Skin:    General: Skin is warm and dry.  Neurological:     Mental Status: She is alert and oriented to person, place, and time.  Psychiatric:        Mood and Affect: Mood normal.        Behavior: Behavior normal.        Thought Content: Thought content normal.        Judgment: Judgment normal.             Total Time 35 minutes  Time spent includes: Detailed review of medical records (labs, imaging, vital signs), medically appropriate exam (mental status, respiratory, cardiac, skin), discussed with treatment team, counseling and educating patient, family and staff, documenting clinical information, medication management and coordination of care.  Lamarr L. Arvid, DNP, FNP-BC Palliative Medicine Team

## 2023-09-15 NOTE — Evaluation (Signed)
 Physical Therapy Evaluation Patient Details Name: [name] MRN: [id number] DOB: 1933-09-28 Today's Date: 09/15/2023  History of Present Illness  Pt is an 89 y.o. female  with past medical history of HTN, HLD, HFpEF, A-fib on Pradaxa , COPD, bronchiectasis, GERD, breast cancer s/p of a left lumpectomy), Psoriatec arthritis, mild pulmonary hypertension, recent right hip fracture  admitted on 09/12/2023 with weakness and poor PO intake, workup for complete heart block and symptomatic bradycardia, afib, COPD.   Clinical Impression  Patient alert, seated in recliner with CNA. Noted for recent incontinent episode, gown and socks changed. minAx2 to stand pivot to [facility], maxA for pericare in standing, and then stand pivot to EOB min-modAx2. Pt had difficulty maintaining PWB status due to fatigue. Returned to supine with needs in reach. Overall the patient demonstrated deficits (see PT Problem List) that impede the patient's functional abilities, safety, and mobility and would benefit from skilled PT intervention.          If plan is discharge home, recommend the following: Two people to help with walking and/or transfers;Two people to help with bathing/dressing/bathroom;Direct supervision/assist for medications management;Help with stairs or ramp for entrance;Assistance with cooking/housework;Assist for transportation   Can travel by private vehicle   No    Equipment Recommendations Other (comment) (TBD)  Recommendations for Other Services       Functional Status Assessment Patient has had a recent decline in their functional status and demonstrates the ability to make significant improvements in function in a reasonable and predictable amount of time.     Precautions / Restrictions Precautions Precautions: Fall Recall of Precautions/Restrictions: Intact Restrictions Weight Bearing Restrictions Per Provider Order: Yes RLE Weight Bearing Per Provider Order: Partial weight bearing RLE  Partial Weight Bearing Percentage or Pounds: 50%      Mobility  Bed Mobility Overal bed mobility: Needs Assistance Bed Mobility: Sit to Supine     Supine to sit: Mod assist, +2 for safety/equipment          Transfers Overall transfer level: Needs assistance Equipment used: 2 person hand held assist Transfers: Sit to/from Stand, Bed to chair/wheelchair/BSC Sit to Stand: Min assist, +2 physical assistance Stand pivot transfers: Min assist, +2 physical assistance              Ambulation/Gait               General Gait Details: deferred due to fatigue, pt requested to return back to bed  Stairs            Wheelchair Mobility     Tilt Bed    Modified Rankin (Stroke Patients Only)       Balance Overall balance assessment: Needs assistance Sitting-balance support: Feet supported, No upper extremity supported Sitting balance-Leahy Scale: Fair     Standing balance support: Bilateral upper extremity supported, Reliant on assistive device for balance Standing balance-Leahy Scale: Poor                               Pertinent Vitals/Pain Pain Assessment Pain Assessment: Faces Faces Pain Scale: No hurt    Home Living Family/patient expects to be discharged to:: Private residence Living Arrangements: Spouse/significant other Available Help at Discharge: Family;Available 24 hours/day Type of Home: House Home Access: Stairs to enter Entrance Stairs-Rails: Right;Left;Can reach both Entrance Stairs-Number of Steps: 5   Home Layout: Able to live on main level with bedroom/bathroom;Two level Home Equipment: Grab bars - toilet;Grab bars -  tub/shower;Cane - single Librarian, academic (2 wheels);Transport chair;Shower seat - built in      Prior Function Prior Level of Function : Needs assist             Mobility Comments: home for two weeks from SNF, due to progressive weakness needed assistance with all mobility ADLs Comments: Husband  reports pt needed help w/ ADLs due to weakness     Extremity/Trunk Assessment   Upper Extremity Assessment Upper Extremity Assessment: Generalized weakness    Lower Extremity Assessment Lower Extremity Assessment: Generalized weakness       Communication   Communication Communication: No apparent difficulties    Cognition Arousal: Alert Behavior During Therapy: WFL for tasks assessed/performed                             Following commands: Intact       Cueing       General Comments General comments (skin integrity, edema, etc.): HR remained in high 40's, SpO2 > 90% on RA; noted small skin abrasion on spine notified RN    Exercises     Assessment/Plan    PT Assessment Patient needs continued PT services  PT Problem List Decreased strength;Decreased activity tolerance;Decreased balance;Decreased mobility       PT Treatment Interventions Balance training;Gait training;Neuromuscular re-education;DME instruction;Patient/family education;Functional mobility training;Stair training;Therapeutic activities;Therapeutic exercise    PT Goals (Current goals can be found in the Care Plan section)  Acute Rehab PT Goals Patient Stated Goal: to feel better PT Goal Formulation: With patient Time For Goal Achievement: 09/29/23 Potential to Achieve Goals: Good    Frequency Min 2X/week     Co-evaluation               AM-PAC PT 6 Clicks Mobility  Outcome Measure Help needed turning from your back to your side while in a flat bed without using bedrails?: A Little Help needed moving from lying on your back to sitting on the side of a flat bed without using bedrails?: A Little Help needed moving to and from a bed to a chair (including a wheelchair)?: A Little Help needed standing up from a chair using your arms (e.g., wheelchair or bedside chair)?: A Little Help needed to walk in hospital room?: A Lot Help needed climbing 3-5 steps with a railing? :  Total 6 Click Score: 15    End of Session   Activity Tolerance: Patient limited by fatigue Patient left: in bed;with call bell/phone within reach;with bed alarm set;with family/visitor present Nurse Communication: Mobility status PT Visit Diagnosis: Other abnormalities of gait and mobility (R26.89);Difficulty in walking, not elsewhere classified (R26.2);Muscle weakness (generalized) (M62.81)    Time: 8888-8879 PT Time Calculation (min) (ACUTE ONLY): 9 min   Charges:   PT Evaluation $PT Eval Low Complexity: 1 Low   PT General Charges $$ ACUTE PT VISIT: 1 Visit       Doyal Shams PT, DPT 1:06 PM,09/15/23

## 2023-09-15 NOTE — Progress Notes (Signed)
    Rogelia Copping, MD [facility]   [address], [address] [place], [place] [address] Phone: [phone] Fax : [phone]   Subjective: The patient has been tolerating food well and the patient daughter states that she ate 3 full meals yesterday.  There is no report of any vomiting or dry heaving.  The patient has finished most of her plate when I came in to see her today.   Objective: Vital signs in last 24 hours: Vitals:   09/14/23 2339 09/15/23 0354 09/15/23 0500 09/15/23 0733  BP: (!) 154/50 (!) 157/47  (!) 167/48  Pulse: (!) 44 (!) 42  (!) 42  Resp: 17 17    Temp: 98.3 F (36.8 C) 97.7 F (36.5 C)  98.3 F (36.8 C)  TempSrc:  Oral    SpO2: 96% 98%  99%  Weight:   62.5 kg   Height:       Weight change: 0.1 kg  Intake/Output Summary (Last 24 hours) at 09/15/2023 9096 Last data filed at 09/15/2023 0538 Gross per 24 hour  Intake 721.16 ml  Output --  Net 721.16 ml     Exam: General: The patient is sitting up in bed in no apparent distress finishing off her breakfast   Lab Results: @LABTEST2 @ Micro Results: No results found for this or any previous visit (from the past 240 hours). Studies/Results: No results found. Medications: I have reviewed the patient's current medications. Scheduled Meds:  budesonide -glycopyrrolate -formoterol   2 puff Inhalation BID   feeding supplement  237 mL Oral BID BM   folic acid   1 mg Oral Daily   LORazepam   0.5 mg Oral QHS   losartan   25 mg Oral Daily   metoCLOPramide  (REGLAN ) injection  10 mg Intravenous Q8H   mirtazapine   15 mg Oral QHS   multivitamin with minerals  1 tablet Oral Daily   pantoprazole  (PROTONIX ) IV  40 mg Intravenous Q24H   Continuous Infusions:  heparin  950 Units/hr (09/15/23 0538)   PRN Meds:.acetaminophen , albuterol , atropine , dextromethorphan-guaiFENesin , diphenhydrAMINE , docusate sodium , hydrALAZINE , HYDROcodone -acetaminophen , ondansetron  (ZOFRAN ) IV   Assessment: Principal Problem:   Complete heart block  (HCC) Active Problems:   Psoriasis vulgaris   Primary hypertension   Bronchiectasis (HCC)   Chronic obstructive pulmonary disease (HCC)   Leukocytosis   (HFpEF) heart failure with preserved ejection fraction (HCC)   Psoriatic arthritis (HCC)   Myocardial injury   Atrial fibrillation, chronic (HCC)   Symptomatic bradycardia   Malnutrition of moderate degree   Anorexia   Nausea and vomiting    Plan: This patient was consulted for poor p.o. intake and nausea with intractable vomiting.  She is no longer having this and is tolerating all of her meals.  Please do not hesitate to contact me if her symptoms should return.  I am not planning any intervention on this patient at this time and I have discussed my plans with the daughter who agrees.  I will sign off.  Please call if any further GI concerns or questions.  We would like to thank you for the opportunity to participate in the care of [facility].    LOS: 3 days   Rogelia Copping, MD.FACG 09/15/2023, 9:03 AM Pager [phone] 7am-5pm  Check AMION for 5pm -7am coverage and on weekends

## 2023-09-16 DIAGNOSIS — I442 Atrioventricular block, complete: Secondary | ICD-10-CM | POA: Diagnosis not present

## 2023-09-16 LAB — CBC
HCT: 35.9 % — ABNORMAL LOW (ref 36.0–46.0)
Hemoglobin: 11.6 g/dL — ABNORMAL LOW (ref 12.0–15.0)
MCH: 29.1 pg (ref 26.0–34.0)
MCHC: 32.3 g/dL (ref 30.0–36.0)
MCV: 90 fL (ref 80.0–100.0)
Platelets: 251 K/uL (ref 150–400)
RBC: 3.99 MIL/uL (ref 3.87–5.11)
RDW: 15.5 % (ref 11.5–15.5)
WBC: 13.8 K/uL — ABNORMAL HIGH (ref 4.0–10.5)
nRBC: 0 % (ref 0.0–0.2)

## 2023-09-16 LAB — HEPARIN LEVEL (UNFRACTIONATED): Heparin Unfractionated: 0.45 [IU]/mL (ref 0.30–0.70)

## 2023-09-16 MED ORDER — LOSARTAN POTASSIUM 50 MG PO TABS
50.0000 mg | ORAL_TABLET | Freq: Every day | ORAL | Status: DC
  Administered 2023-09-17 – 2023-09-21 (×5): 50 mg via ORAL
  Filled 2023-09-16 (×5): qty 1

## 2023-09-16 NOTE — Progress Notes (Signed)
 [facility] CLINIC CARDIOLOGY PROGRESS NOTE       Patient ID: [name] MRN: [id number] DOB/AGE: 88-May-1935 88 y.o.  Admit date: 09/12/2023 Referring Physician Dr. Hilma Primary Physician Cleotilde Oneil FALCON, MD Primary Cardiologist Tinnie Maiden, NP; Dr. Dewane Reason for Consultation complete heart block  HPI: [name] is a 89 y.o. female  with a past medical history of history bradycardia, hypokalemia, paroxysmal atrial fibrillation (on Pradaxa ), chronic HFpEF, mild pulmonary hypertension, hypertension, hyperlipidemia, pulmonary fibrosis, COPD who presented to the ED on 09/12/2023 for worsening generalized weakness and poor intake. Of note patient had a recent fall that resulted in right hip fracture and 08/16/2023 and recently got out of rehab about a week ago.  Heart rates were found to be in the 40s.  Cardiology was consulted for further evaluation.   Interval History: -Patient seen and examined this AM, sitting upright in bedside chair with husband present at bedside.  Patient states she feels okay and denies chest pain, lightheadedness or dizziness.  - Continues to have poor p.o. intake but this is slightly improved from admission. - BP remains elevated, heart rate stable in the 40-50s. Patient amenable to proceeding with pacemaker placement. -Palliative following, appreciate their help with GOC conversations with patient and family.   Pertinent Cardiac History (Most recent) 3 day Holter monitor:08/01/2023 to 08/04/2023 Predominant rhythm is sinus rhythm. Maximum heart rate 141 bpm, minimum heart rate 57 bpm the average heart rate of 74 bpm Occasional less than 1% PACs and rare PVCs noted 8 occurrences of short lasting supraventricular tachycardia, longest episode 17 beats 0 patient triggers  Impression: Predominant rhythm is sinus rhythm. No significant arrhythmias.   Review of systems complete and found to be negative unless listed above    Past Medical History:   Diagnosis Date   Acquired thrombophilia (HCC)    Acute metabolic encephalopathy 06/19/2020   Allergy    Atherosclerosis of aorta (HCC)    Atrial fibrillation (HCC)    Bronchiectasis (HCC)    Cancer (HCC)    left breast cancer    Chronic nausea    COPD (chronic obstructive pulmonary disease) (HCC)    GERD (gastroesophageal reflux disease)    Grade II diastolic dysfunction    Hyperlipemia    Hypertension    Intractable nausea and vomiting 06/20/2018   Mild concentric left ventricular hypertrophy (LVH)    Mild mitral stenosis by prior echocardiogram    Mild pulmonary hypertension (HCC)    Psoriatic arthritis (HCC)    Stage 2 moderate COPD by GOLD classification [facility])     Past Surgical History:  Procedure Laterality Date   BREAST LUMPECTOMY Left 2008   CATARACT EXTRACTION W/PHACO Left 02/13/2023   Procedure: CATARACT EXTRACTION PHACO AND INTRAOCULAR LENS PLACEMENT (IOC) LEFT  KDB GONIOTOMY;  Surgeon: Enola Feliciano Hugger, MD;  Location: [facility];  Service: Ophthalmology;  Laterality: Left;  10.53 0:55.   CATARACT EXTRACTION W/PHACO Right 02/27/2023   Procedure: CATARACT EXTRACTION PHACO AND INTRAOCULAR LENS PLACEMENT (IOC) RIGHT  [place], goniotomy 15.90 01:28.6;  Surgeon: Enola Feliciano Hugger, MD;  Location: [facility];  Service: Ophthalmology;  Laterality: Right;   CHOLECYSTECTOMY     HIP PINNING,CANNULATED Left 06/05/2020   Procedure: CANNULATED HIP PINNING;  Surgeon: Kathlynn Sharper, MD;  Location: [facility] OR[facility];  Service: Orthopedics;  Laterality: Left;   HIP PINNING,CANNULATED Right 08/16/2023   Procedure: FIXATION, FEMUR, NECK, PERCUTANEOUS, USING SCREW;  Surgeon: Edie Norleen PARAS, MD;  Location: [facility] OR[facility];  Service: Orthopedics;  Laterality:  Right;   PARTIAL HYSTERECTOMY     TEE WITHOUT CARDIOVERSION N/A 06/10/2020   Procedure: TRANSESOPHAGEAL ECHOCARDIOGRAM (TEE);  Surgeon: Darliss Rogue, MD;  Location: [facility] OR[facility];  Service: Cardiovascular;  Laterality: N/A;     Medications Prior to Admission  Medication Sig Dispense Refill Last Dose/Taking   amiodarone  (PACERONE ) 200 MG tablet Take 1 tablet (200 mg total) by mouth daily. 30 tablet 0 09/12/2023 Morning   dabigatran  (PRADAXA ) 150 MG CAPS capsule Take 1 capsule (150 mg total) by mouth every 12 (twelve) hours. 60 capsule 0 09/12/2023 at  8:00 AM   docusate sodium  (COLACE) 100 MG capsule Take 1 capsule (100 mg total) by mouth 2 (two) times daily. 10 capsule 0 Past Month   Fluticasone -Umeclidin-Vilant (TRELEGY ELLIPTA) 100-62.5-25 MCG/ACT AEPB Inhale 1 puff into the lungs daily.   09/12/2023 Morning   folic acid  (FOLVITE ) 1 MG tablet Take 1 mg by mouth daily. Pt takes this med the days she does not take methotrexate    09/12/2023 Morning   hydrochlorothiazide  (HYDRODIURIL ) 25 MG tablet Take 1 tablet by mouth daily.   09/12/2023 Morning   HYDROcodone -acetaminophen  (NORCO/VICODIN) 5-325 MG tablet Take 1-2 tablets by mouth every 4 (four) hours as needed for moderate pain (pain score 4-6). 30 tablet 0 09/12/2023   LORazepam  (ATIVAN ) 0.5 MG tablet Take 1 tablet (0.5 mg total) by mouth at bedtime. 10 tablet 0 09/12/2023   methotrexate  (RHEUMATREX) 2.5 MG tablet Take 5 mg by mouth once a week. Caution:Chemotherapy. Protect from light. Pt take 2 weekly.   09/10/2023   metoCLOPramide  (REGLAN ) 10 MG tablet Take 1 tablet (10 mg total) by mouth 4 (four) times daily -  before meals and at bedtime. 120 tablet 0 09/12/2023 Morning   metoprolol  tartrate (LOPRESSOR ) 25 MG tablet Take 1.5 tablets (37.5 mg total) by mouth 2 (two) times daily. 90 tablet 0 Past Week   omeprazole  (PRILOSEC) 40 MG capsule Take 40 mg by mouth daily.   09/12/2023 Morning   ondansetron  (ZOFRAN ) 4 MG tablet Take 4 mg by mouth every 8 (eight) hours as needed for nausea or vomiting.   Unknown   [Paused] furosemide  (LASIX ) 40 MG tablet Take 1 tablet (40 mg total) by mouth daily. 30 tablet 0    Social History   Socioeconomic History   Marital status: Married    Spouse  name: Not on file   Number of children: 2   Years of education: Not on file   Highest education level: Not on file  Occupational History   Not on file  Tobacco Use   Smoking status: Never   Smokeless tobacco: Never  Vaping Use   Vaping status: Never Used  Substance and Sexual Activity   Alcohol use: No   Drug use: No   Sexual activity: Not on file  Other Topics Concern   Not on file  Social History Narrative   Not on file   Social Drivers of Health   Financial Resource Strain: Low Risk  (08/10/2023)   Received from [facility]   Overall Financial Resource Strain (CARDIA)    Difficulty of Paying Living Expenses: Not hard at all  Food Insecurity: No Food Insecurity (09/14/2023)   Hunger Vital Sign    Worried About Running Out of Food in the Last Year: Never true    Ran Out of Food in the Last Year: Never true  Transportation Needs: No Transportation Needs (09/14/2023)   PRAPARE - Administrator, Civil Service (Medical): No  Lack of Transportation (Non-Medical): No  Physical Activity: Sufficiently Active (02/17/2021)   Exercise Vital Sign    Days of Exercise per Week: 7 days    Minutes of Exercise per Session: 30 min  Stress: No Stress Concern Present (02/17/2021)   [employer] of Occupational Health - Occupational Stress Questionnaire    Feeling of Stress : Not at all  Social Connections: Moderately Isolated (09/14/2023)   Social Connection and Isolation Panel    Frequency of Communication with Friends and Family: More than three times a week    Frequency of Social Gatherings with Friends and Family: Three times a week    Attends Religious Services: Never    Active Member of Clubs or Organizations: No    Attends Banker Meetings: Never    Marital Status: Married  Catering manager Violence: Not At Risk (09/14/2023)   Humiliation, Afraid, Rape, and Kick questionnaire    Fear of Current or Ex-Partner: No    Emotionally Abused:  No    Physically Abused: No    Sexually Abused: No    Family History  Problem Relation Age of Onset   Breast cancer Sister      Vitals:   09/15/23 2327 09/16/23 0436 09/16/23 0500 09/16/23 0726  BP: (!) 165/41 (!) 164/42  (!) 183/54  Pulse: (!) 48 (!) 48  (!) 49  Resp: 18 18  19   Temp: 97.6 F (36.4 C) 97.9 F (36.6 C)  97.8 F (36.6 C)  TempSrc: Oral Oral  Oral  SpO2: 91% 92%  92%  Weight:   58.6 kg   Height:        PHYSICAL EXAM General: Chronically ill-appearing elderly female, well nourished, in no acute distress. HEENT: Normocephalic and atraumatic. Neck: No JVD.   Lungs: Normal respiratory effort on room air. Clear bilaterally to auscultation. No wheezes, crackles, rhonchi.  Heart: HRR, slow rates. Normal S1 and S2 without gallops or murmurs.  Abdomen: Non-distended appearing.  Msk: Normal strength and tone for age. Extremities: Warm and well perfused. No clubbing, cyanosis, edema.  Neuro: Alert and oriented X 3. Psych: Answers questions appropriately.   Labs: Basic Metabolic Panel: Recent Labs    09/14/23 0343 09/15/23 0640  NA 137 137  K 3.7 4.8  CL 102 105  CO2 23 23  GLUCOSE 124* 110*  BUN 30* 24*  CREATININE 0.47 0.57  CALCIUM  8.3* 8.5*  MG 1.8 2.4   Liver Function Tests: No results for input(s): AST, ALT, ALKPHOS, BILITOT, PROT, ALBUMIN in the last 72 hours.  No results for input(s): LIPASE, AMYLASE in the last 72 hours. CBC: Recent Labs    09/15/23 0640 09/16/23 0428  WBC 14.5* 13.8*  HGB 11.5* 11.6*  HCT 33.9* 35.9*  MCV 88.3 90.0  PLT 231 251   Cardiac Enzymes: No results for input(s): CKTOTAL, CKMB, CKMBINDEX, TROPONINIHS in the last 72 hours.  BNP: No results for input(s): BNP in the last 72 hours.  D-Dimer: No results for input(s): DDIMER in the last 72 hours. Hemoglobin A1C: No results for input(s): HGBA1C in the last 72 hours. Fasting Lipid Panel: No results for input(s): CHOL, HDL,  LDLCALC, TRIG, CHOLHDL, LDLDIRECT in the last 72 hours. Thyroid  Function Tests: No results for input(s): TSH, T4TOTAL, T3FREE, THYROIDAB in the last 72 hours.  Invalid input(s): FREET3  Anemia Panel: No results for input(s): VITAMINB12, FOLATE, FERRITIN, TIBC, IRON , RETICCTPCT in the last 72 hours.   Radiology: DG HIP PORT UNILAT WITH PELVIS 1V RIGHT Result  Date: 09/15/2023 CLINICAL DATA:  Right hip pain, postop 1 month EXAM: DG HIP (WITH OR WITHOUT PELVIS) 1V PORT RIGHT COMPARISON:  Radiographs 08/16/2023 FINDINGS: Screw fixation of both femoral necks. No radiographic evidence of loosening. No acute fracture or dislocation. Degenerative changes pubic symphysis, both hips, SI joints and lower lumbar spine. IMPRESSION: Screw fixation of both femoral necks. No radiographic evidence of loosening. No acute fracture. Electronically Signed   By: Norman Gatlin M.D.   On: 09/15/2023 19:46   ECHOCARDIOGRAM COMPLETE Result Date: 09/15/2023    ECHOCARDIOGRAM REPORT   Patient Name:   [name] Housand Date of Exam: 09/15/2023 Medical Rec #:  [id number]     Height:       62.0 in Accession #:    [id number]    Weight:       137.8 lb Date of Birth:  Sep 14, 1933     BSA:          1.632 m Patient Age:    89 years      BP:           167/48 mmHg Patient Gender: F             HR:           42 bpm. Exam Location:  [facility] Procedure: 2D Echo, Cardiac Doppler and Color Doppler (Both Spectral and Color            Flow Doppler were utilized during procedure). Indications:     Abnormal ECG R94.31  History:         Patient has prior history of Echocardiogram examinations, most                  recent 05/19/2023. Arrythmias:Atrial Fibrillation; Risk                  Factors:Hypertension.  Sonographer:     Christopher Furnace Referring Phys:  [id number] GABRIELLA DECOSTE Diagnosing Phys: Keller Alluri IMPRESSIONS  1. Left ventricular ejection fraction, by estimation, is 70 to 75%. The left ventricle has hyperdynamic  function. The left ventricle has no regional wall motion abnormalities. There is mild left ventricular hypertrophy. Left ventricular diastolic parameters are indeterminate.  2. Right ventricular systolic function is normal. The right ventricular size is normal. There is severely elevated pulmonary artery systolic pressure. The estimated right ventricular systolic pressure is 63.7 mmHg.  3. Trivial mitral valve regurgitation. Mild mitral stenosis. Severe mitral annular calcification.  4. The aortic valve is tricuspid. Aortic valve regurgitation is not visualized. FINDINGS  Left Ventricle: Left ventricular ejection fraction, by estimation, is 70 to 75%. The left ventricle has hyperdynamic function. The left ventricle has no regional wall motion abnormalities. The left ventricular internal cavity size was normal in size. There is mild left ventricular hypertrophy. Left ventricular diastolic parameters are indeterminate. Right Ventricle: The right ventricular size is normal. No increase in right ventricular wall thickness. Right ventricular systolic function is normal. There is severely elevated pulmonary artery systolic pressure. The tricuspid regurgitant velocity is 3.83 m/s, and with an assumed right atrial pressure of 5 mmHg, the estimated right ventricular systolic pressure is 63.7 mmHg. Left Atrium: Left atrial size was normal in size. Right Atrium: Right atrial size was normal in size. Pericardium: There is no evidence of pericardial effusion. Mitral Valve: There is mild thickening of the mitral valve leaflet(s). Severe mitral annular calcification. Trivial mitral valve regurgitation. Mild mitral valve stenosis. MV peak gradient, 16.5 mmHg. The mean mitral valve gradient is 6.0  mmHg. Tricuspid Valve: The tricuspid valve is normal in structure. Tricuspid valve regurgitation is mild. Aortic Valve: The aortic valve is tricuspid. Aortic valve regurgitation is not visualized. Aortic valve mean gradient measures 5.0  mmHg. Aortic valve peak gradient measures 7.8 mmHg. Aortic valve area, by VTI measures 1.81 cm. Pulmonic Valve: The pulmonic valve was not well visualized. Pulmonic valve regurgitation is trivial. Aorta: The aortic root and ascending aorta are structurally normal, with no evidence of dilitation. Venous: The inferior vena cava was not well visualized. IAS/Shunts: The atrial septum is grossly normal. Additional Comments: There is pleural effusion in the left lateral region.  LEFT VENTRICLE PLAX 2D LVIDd:         3.50 cm   Diastology LVIDs:         2.60 cm   LV e' medial:    4.68 cm/s LV PW:         1.10 cm   LV E/e' medial:  31.6 LV IVS:        1.30 cm   LV e' lateral:   6.74 cm/s LVOT diam:     2.00 cm   LV E/e' lateral: 22.0 LV SV:         54 LV SV Index:   33 LVOT Area:     3.14 cm  RIGHT VENTRICLE RV Basal diam:  2.80 cm RV Mid diam:    2.70 cm LEFT ATRIUM             Index        RIGHT ATRIUM           Index LA diam:        4.40 cm 2.70 cm/m   RA Area:     15.30 cm LA Vol (A2C):   32.7 ml 20.04 ml/m  RA Volume:   40.60 ml  24.88 ml/m LA Vol (A4C):   39.2 ml 24.02 ml/m LA Biplane Vol: 36.2 ml 22.19 ml/m  AORTIC VALVE AV Area (Vmax):    1.61 cm AV Area (Vmean):   1.50 cm AV Area (VTI):     1.81 cm AV Vmax:           140.00 cm/s AV Vmean:          102.350 cm/s AV VTI:            0.298 m AV Peak Grad:      7.8 mmHg AV Mean Grad:      5.0 mmHg LVOT Vmax:         71.90 cm/s LVOT Vmean:        48.900 cm/s LVOT VTI:          0.171 m LVOT/AV VTI ratio: 0.57  AORTA Ao Root diam: 2.50 cm MITRAL VALVE                TRICUSPID VALVE MV Area (PHT): 2.36 cm     TR Peak grad:   58.7 mmHg MV Area VTI:   0.78 cm     TR Vmax:        383.00 cm/s MV Peak grad:  16.5 mmHg MV Mean grad:  6.0 mmHg     SHUNTS MV Vmax:       2.03 m/s     Systemic VTI:  0.17 m MV Vmean:      113.0 cm/s   Systemic Diam: 2.00 cm MV Decel Time: 322 msec MV E velocity: 148.00 cm/s MV A velocity: 121.00 cm/s MV E/A ratio:  1.22 Keller  Alluri  Electronically signed by Keller Paterson Signature Date/Time: 09/15/2023/1:31:34 PM    Final    DG Chest Portable 1 View Result Date: 09/12/2023 CLINICAL DATA:  Weakness, shortness of breath EXAM: PORTABLE CHEST 1 VIEW COMPARISON:  07/18/2023 FINDINGS: Heart and mediastinal contours within normal limits. Aortic atherosclerosis. Right lung clear. Patchy opacity in the left lower lung again noted, improved since prior study. No visible effusions or acute bony abnormality. Biapical scarring. IMPRESSION: Persistent but improving left basilar atelectasis or infiltrate. No visible effusions. Electronically Signed   By: Franky Crease M.D.   On: 09/12/2023 19:30    ECHO as above   05/19/2023 1. Left ventricular ejection fraction, by estimation, is 60 to 65%. The left ventricle has normal function. The left ventricle has no regional wall motion abnormalities. There is mild left ventricular hypertrophy. Left ventricular diastolic parameters are consistent with Grade II diastolic dysfunction (pseudonormalization).   2. Right ventricular systolic function is normal. The right ventricular size is normal. There is mildly elevated pulmonary artery systolic pressure.   3. Left atrial size was mildly dilated.   4. The mitral valve is normal in structure. No evidence of mitral valve regurgitation. Mild mitral stenosis. The mean mitral valve gradient is 4.0 mmHg. Severe mitral annular calcification.   5. Tricuspid valve regurgitation is mild to moderate.   6. The aortic valve is normal in structure. Aortic valve regurgitation is not visualized. Aortic valve sclerosis is present, with no evidence of aortic valve stenosis.   7. The inferior vena cava is normal in size with greater than 50% respiratory variability, suggesting right atrial pressure of 3 mmHg.    TELEMETRY reviewed by me 09/16/2023: complete heart block, rate 40-50s   EKG reviewed by me:  complete heart block, rate 50 bpm with nonspecific ST-T wave  changes  Data reviewed by me 09/16/2023: last 24h vitals tele labs imaging I/O hospitalist progress notes.  Principal Problem:   Complete heart block (HCC) Active Problems:   Psoriasis vulgaris   Primary hypertension   Bronchiectasis (HCC)   Chronic obstructive pulmonary disease (HCC)   Leukocytosis   (HFpEF) heart failure with preserved ejection fraction (HCC)   Psoriatic arthritis (HCC)   Myocardial injury   Atrial fibrillation, chronic (HCC)   Symptomatic bradycardia   Malnutrition of moderate degree   Anorexia   Nausea and vomiting    ASSESSMENT AND PLAN:  [name] is a 89 y.o. female  with a past medical history of history bradycardia, hypokalemia, paroxysmal atrial fibrillation (on Pradaxa ), chronic HFpEF, mild pulmonary hypertension, hyperlipidemia, pulmonary fibrosis, COPD who presented to the ED on 09/12/2023 for worsening generalized weakness and poor intake.  Of note patient had a recent fall that resulted in right hip fracture and 08/16/2023 and recently got out of rehab about a week ago.  Heart rates were found to be in the 40s.  Cardiology was consulted for further evaluation.  # Complete heart block # History of bradycardia # Paroxysmal atrial fibrillation Patient presents with worsening generalized weakness and poor intake for the last several weeks.  Per husband heart rates have been running in mid 40s for the last several weeks.  EKG in ED revealed high-grade AV block with rate 50.  Per telemetry rate remains in 40s.  TSH within normal limits.   -Echo pending. -Avoid all AVN blockers.  -Hold home metoprolol  and amiodarone . (Last taken on 07/08) -Monitor and replenish electrolytes for a goal K >4, Mag >2  -Continue heparin  infusion for  stroke risk reduction. (Home Pradaxa  held) -On 7/9 GDC, PA discussed with patient, patient's husband and daughter regarding slow heart rates. Family states patient has HR in mid 40s for past several weeks. Per tele patient HR remains  in 40s in complete heart block. Discussed holding home metoprolol  and amiodarone  and allowing medication to get out of system. Also discussed possibility of PPM if HR does not improve or worsen and that would require transfer to [facility] for PPM procedure. Family and patient agree with monitoring telemetry/HR for now to see if HR improves off of AVN blockers. On 07/09, 07/10, 07/11 patient states she doesn't want PPM.  -7/12 yesterday afternoon patient decided she was ok with having pacemaker placed. Discussed with Dr. Ammon who recommends Micra. Will proceed with placement Monday at 7:30 AM. -Recommend adequate hydration and p.o. intake.  Recommend PT for deconditioning.  # Chronic HFpEF Patient without shortness of breath or LEE. Chest x-ray with no evidence of congestion or pleural effusions.  BNP elevated at 770.  Echo this admission with EF 70-75%, no wall motion abnormalities, mild LVH, severely elevated PASP. -Continue to hold home PO lasix  40 mg daily as patient does not appear volume up and with poor PO intake. -Will plan to optimize GDMT as BP and renal function allow.   # Hypertension # Hyperlipidemia # Demand ischemia Patient without chest pain. Troponins minimally elevated and flat 21 > 20 > 16 > 22.  EKG with high grade AV block, rate 50 bpm with nonspecific ST-T wave changes.  -D/c home hydrochlorothiazide  due to electrolyte abnormalities, hypokalemia. -Continue losartan  25 mg daily.  -Hold metoprolol  as stated above.  This patient's plan of care was discussed and created with Dr. Florencio and he is in agreement.  Signed: Danita Bloch, PA-C  09/16/2023, 10:23 AM [facility]

## 2023-09-16 NOTE — Progress Notes (Addendum)
 PHARMACY - ANTICOAGULATION CONSULT NOTE  Pharmacy Consult for heparin  Indication: atrial fibrillation  Allergies  Allergen Reactions   Amoxicillin  Nausea Only   Eliquis  [Apixaban ] Nausea Only    Patient Measurements: Height: 5' 2 (157.5 cm) Weight: 62.5 kg (137 lb 12.6 oz) IBW/kg (Calculated) : 50.1 HEPARIN  DW (KG): 58.9  Vital Signs: Temp: 97.9 F (36.6 C) (07/12 0436) BP: 164/42 (07/12 0436) Pulse Rate: 48 (07/12 0436)  Labs: Recent Labs    09/14/23 0343 09/14/23 0541 09/15/23 0640 09/16/23 0428  HGB 11.6*  --  11.5* 11.6*  HCT 35.2*  --  33.9* 35.9*  PLT 229  --  231 251  HEPARINUNFRC >1.10* 0.42 0.44 0.45  CREATININE 0.47  --  0.57  --     Estimated Creatinine Clearance: 43.1 mL/min (by C-G formula based on SCr of 0.57 mg/dL).   Medical History: Past Medical History:  Diagnosis Date   Acquired thrombophilia (HCC)    Acute metabolic encephalopathy 06/19/2020   Allergy    Atherosclerosis of aorta (HCC)    Atrial fibrillation (HCC)    Bronchiectasis (HCC)    Cancer (HCC)    left breast cancer    Chronic nausea    COPD (chronic obstructive pulmonary disease) (HCC)    GERD (gastroesophageal reflux disease)    Grade II diastolic dysfunction    Hyperlipemia    Hypertension    Intractable nausea and vomiting 06/20/2018   Mild concentric left ventricular hypertrophy (LVH)    Mild mitral stenosis by prior echocardiogram    Mild pulmonary hypertension (HCC)    Psoriatic arthritis (HCC)    Stage 2 moderate COPD by GOLD classification (HCC)     Medications:  Scheduled:   budesonide -glycopyrrolate -formoterol   2 puff Inhalation BID   feeding supplement  237 mL Oral BID BM   folic acid   1 mg Oral Daily   LORazepam   0.5 mg Oral QHS   losartan   25 mg Oral Daily   metoCLOPramide   10 mg Oral TID AC   mirtazapine   15 mg Oral QHS   multivitamin with minerals  1 tablet Oral Daily   pantoprazole   40 mg Oral Daily   Infusions:   heparin  950 Units/hr (09/15/23  1512)   PRN: acetaminophen , albuterol , atropine , dextromethorphan-guaiFENesin , diphenhydrAMINE , docusate sodium , hydrALAZINE , HYDROcodone -acetaminophen , ondansetron  (ZOFRAN ) IV  Assessment: 89 year old female admitted for complete heart block receiving anticoagulation for atrial fibrillation. Anticoagulated with dabigatran  at home. Notable PMH femur fracture (08/2023), breast cancer, HFpEF, psoriatic arthritis, bronchiectasis. CHAD-VASC score of 5. CBC stable with Hemoglobin 11.5 g/dL and platelet 768 K/uL. Renal function good given her age with Scr 0.57 (CrCL 43.1 mL/min).   Heparin  level today (7/11) of 0.44 within goal range x 4. Patient continues to receive 950 units/hr infusion.   Goal of Therapy:  Heparin  level 0.3-0.7 units/ml Monitor platelets by anticoagulation protocol: Yes   Plan:  Heparin  level remains therapeutic x 5.  Continue heparin  infusion of 950 units/hr. Continue to monitor heparin  level, hemoglobin, and platelets daily.   Rankin CANDIE Dills, PharmD, [facility] 09/16/2023 5:32 AM

## 2023-09-16 NOTE — TOC Progression Note (Signed)
 Transition of Care [facility]) - Progression Note    Patient Details  Name: [name] MRN: [id number] Date of Birth: 1933-06-28  Transition of Care [facility] CM/SW Contact  [place] L Janice Bod[place], [place] Phone Number: 09/16/2023, 5:22 PM  Clinical Narrative:     CSW received a call from patient advising of SNF choice. Patient stated that she was discharged from [facility] but notes advise that she was discharged from [facility]. Patient was adamant that she discharged from [facility]. Patient stated that she would like to go back to [place]. She reported that she would also consider [facility]. [facility] and [facility].  *Patient has used up 17 of her Medicare days. Patient denies this. Patient appears to be a poor historian.   FL2 will be completed and sent to facilities.     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  Physical Activity: Sufficiently Active (02/17/2021)  Social Connections: Moderately Isolated (09/14/2023)  Stress: No Stress Concern Present (02/17/2021)  Tobacco Use: Low Risk  (09/14/2023)  Recent Concern: Tobacco Use - Medium Risk (08/10/2023)   Received from [facility]    Readmission Risk Interventions     No data to display

## 2023-09-16 NOTE — Progress Notes (Signed)
 PROGRESS NOTE    [name]  FMW:[id number] DOB: Aug 06, 1933 DOA: 09/12/2023 PCP: Cleotilde Oneil FALCON, MD    Brief Narrative:  89 y.o. female with medical history significant of HTN, HLD, HFpEF, A-fib on Pradaxa , COPD, bronchiectasis, GERD, breast cancer s/p of a left lumpectomy), Psoriatec arthritis, mild pulmonary hypertension, recent right hip fracture, who presents with weakness.   Patient was recently hospitalized from 6/11 - 6/16 due to mechanical fall and right hip fracture.  Patient was s/p of right hip surgery.  Patient states that her surgical site has been healing well.  In the past several days she has been feeling generalized weak, with poor appetite and decreased oral intake, cannot ambulate without assistance due to weakness.  No fall.  She has nausea, no vomiting, diarrhea or abdominal pain.  No chest pain, cough, SOB.  Denies symptoms of UTI.  No rectal bleeding or dark stool.  Per her husband, patient's heart rate was at 40s when he checked it for her at home.   Pt was found to have new complete heart block on EKG with heart rate in 40s in ED. Pt is taking amiodarone  200 mg daily and metoprolol  37.5 mg twice daily for A-fib currently.    Assessment & Plan:   Principal Problem:   Complete heart block (HCC) Active Problems:   Atrial fibrillation, chronic (HCC)   Myocardial injury   (HFpEF) heart failure with preserved ejection fraction (HCC)   Primary hypertension   Leukocytosis   Bronchiectasis (HCC)   Chronic obstructive pulmonary disease (HCC)   Psoriasis vulgaris   Psoriatic arthritis (HCC)   Symptomatic bradycardia   Malnutrition of moderate degree   Anorexia   Nausea and vomiting  High degree heart block Symptomatic bradycardia Currently hemodynamically stable.  No chest pain or shortness of breath.  Heart rate 40.  EDP consulted with cardiology who recommend holding metoprolol  and amiodarone .  Remains in heart block.  Was previously refusing PPM but now  agreeable upon repeat conversation. Plan: Continue telemetry Continue holding amiodarone  and metoprolol  Cardiology following Pacemaker plan Monday 7/14  Chronic A-fib: Currently bradycardic.   Amiodarone  and metoprolol  on hold Continue telemetry as above Continue continue heparin  GTT   Myocardial injury:  trop 21.  Patient does not have chest pain or SOB.   Likely demand ischemia. EKG telemetry  (HFpEF) heart failure with preserved ejection fraction (HCC):  2D echo on 05/19/2023 showed EF 60 to 65% with grade 2 diastolic dysfunction.  Patient does not have leg edema or JVD.  No SOB.  CHF seems to be compensated. - Continue to hold diuretic   Primary hypertension HCTZ on hold Increase losartan  to 50 mg daily IV hydralazine  as needed.     Leukocytosis:  Appears to be WBC 23.2, no source infection identified so far.   Appears to be chronic issue .  No indication of infection.  Continue to hold antibiotics.   Bronchiectasis (HCC) and Chronic obstructive pulmonary disease (HCC):  stable.  No wheezing, no SOB. - Continue bronchodilators and as needed Mucinex    Hx of psoriasis vulgaris and psoriatic arthritis (HCC): - Patient is on weekly methotrexate  (every Sunday).  Defer to outpatient  Dry heaving Poor p.o. intake Intractable nausea Much improved.  Appears to be medication related.  Possibly secondary to amiodarone .  Amiodarone  on hold and oral intake is much improved.  GI consult requested but have signed off.  No plans for inpatient endoscopy.  On p.o. PPI and p.o. Reglan .  Will continue.  DVT prophylaxis: IV heparin  Code Status: Full Family Communication: Husband at bedside 7/9, 7/10, 7/11, 7/12.  Daughter at bedside 7/10, 7/11 Disposition Plan: Status is: Inpatient Remains inpatient appropriate because: Multiple acute issues as above   Level of care: Telemetry Cardiac  Consultants:  Cardiology  Procedures:  None  Antimicrobials: None   Subjective: Seen  and examined.  Resting in chair.  Affect appears flattened today.  Objective: Vitals:   09/16/23 0436 09/16/23 0500 09/16/23 0726 09/16/23 1134  BP: (!) 164/42  (!) 183/54 (!) 185/56  Pulse: (!) 48  (!) 49 (!) 50  Resp: 18  19 15   Temp: 97.9 F (36.6 C)  97.8 F (36.6 C) 97.6 F (36.4 C)  TempSrc: Oral  Oral Oral  SpO2: 92%  92% 94%  Weight:  58.6 kg    Height:        Intake/Output Summary (Last 24 hours) at 09/16/2023 1350 Last data filed at 09/16/2023 0900 Gross per 24 hour  Intake 330.88 ml  Output --  Net 330.88 ml   Filed Weights   09/14/23 0500 09/15/23 0500 09/16/23 0500  Weight: 62.2 kg 62.5 kg 58.6 kg    Examination:  General exam: NAD.  Appears fatigued Respiratory system: Poor respiratory effort.  Normal work of breathing.  Room air Cardiovascular system: S 1 S2, bradycardic, irregular rhythm, no murmurs, no pedal edema Gastrointestinal system: Soft, NT/ND, normal bowel sounds Central nervous system: Alert and oriented. No focal neurological deficits. Extremities: Decreased power bilateral lower extremities Skin: No rashes, lesions or ulcers Psychiatry: Judgement and insight appear impaired. Mood & affect flattened.     Data Reviewed: I have personally reviewed following labs and imaging studies  CBC: Recent Labs  Lab 09/12/23 1745 09/13/23 0353 09/14/23 0343 09/15/23 0640 09/16/23 0428  WBC 23.2* 21.5* 17.4* 14.5* 13.8*  NEUTROABS 19.9*  --   --   --   --   HGB 14.1 12.2 11.6* 11.5* 11.6*  HCT 41.7 37.1 35.2* 33.9* 35.9*  MCV 87.8 89.4 88.9 88.3 90.0  PLT 273 230 229 231 251   Basic Metabolic Panel: Recent Labs  Lab 09/12/23 1745 09/13/23 0353 09/14/23 0343 09/15/23 0640  NA 135 136 137 137  K 3.5 2.8* 3.7 4.8  CL 98 99 102 105  CO2 24 23 23 23   GLUCOSE 120* 92 124* 110*  BUN 22 19 30* 24*  CREATININE 0.64 0.51 0.47 0.57  CALCIUM  8.9 7.9* 8.3* 8.5*  MG 1.9  --  1.8 2.4   GFR: Estimated Creatinine Clearance: 39.2 mL/min (by C-G  formula based on SCr of 0.57 mg/dL). Liver Function Tests: Recent Labs  Lab 09/12/23 1745  AST 20  ALT 12  ALKPHOS 87  BILITOT 1.0  PROT 7.2  ALBUMIN 3.3*   No results for input(s): LIPASE, AMYLASE in the last 168 hours. No results for input(s): AMMONIA in the last 168 hours. Coagulation Profile: Recent Labs  Lab 09/12/23 2322  INR 1.3*   Cardiac Enzymes: No results for input(s): CKTOTAL, CKMB, CKMBINDEX, TROPONINI in the last 168 hours. BNP (last 3 results) No results for input(s): PROBNP in the last 8760 hours. HbA1C: No results for input(s): HGBA1C in the last 72 hours. CBG: No results for input(s): GLUCAP in the last 168 hours. Lipid Profile: No results for input(s): CHOL, HDL, LDLCALC, TRIG, CHOLHDL, LDLDIRECT in the last 72 hours. Thyroid  Function Tests: No results for input(s): TSH, T4TOTAL, FREET4, T3FREE, THYROIDAB in the last 72 hours.  Anemia Panel: No results  for input(s): VITAMINB12, FOLATE, FERRITIN, TIBC, IRON , RETICCTPCT in the last 72 hours. Sepsis Labs: No results for input(s): PROCALCITON, LATICACIDVEN in the last 168 hours.  No results found for this or any previous visit (from the past 240 hours).       Radiology Studies: DG HIP PORT UNILAT WITH PELVIS 1V RIGHT Result Date: 09/15/2023 CLINICAL DATA:  Right hip pain, postop 1 month EXAM: DG HIP (WITH OR WITHOUT PELVIS) 1V PORT RIGHT COMPARISON:  Radiographs 08/16/2023 FINDINGS: Screw fixation of both femoral necks. No radiographic evidence of loosening. No acute fracture or dislocation. Degenerative changes pubic symphysis, both hips, SI joints and lower lumbar spine. IMPRESSION: Screw fixation of both femoral necks. No radiographic evidence of loosening. No acute fracture. Electronically Signed   By: Norman Gatlin M.D.   On: 09/15/2023 19:46   ECHOCARDIOGRAM COMPLETE Result Date: 09/15/2023    ECHOCARDIOGRAM REPORT   Patient Name:   [name] Date of Exam: 09/15/2023 Medical Rec #:  [id number]     Height:       62.0 in Accession #:    [id number]    Weight:       137.8 lb Date of Birth:  1933-07-02     BSA:          1.632 m Patient Age:    89 years      BP:           167/48 mmHg Patient Gender: F             HR:           42 bpm. Exam Location:  [facility] Procedure: 2D Echo, Cardiac Doppler and Color Doppler (Both Spectral and Color            Flow Doppler were utilized during procedure). Indications:     Abnormal ECG R94.31  History:         Patient has prior history of Echocardiogram examinations, most                  recent 05/19/2023. Arrythmias:Atrial Fibrillation; Risk                  Factors:Hypertension.  Sonographer:     Christopher Furnace Referring Phys:  [id number] GABRIELLA DECOSTE Diagnosing Phys: Keller Alluri IMPRESSIONS  1. Left ventricular ejection fraction, by estimation, is 70 to 75%. The left ventricle has hyperdynamic function. The left ventricle has no regional wall motion abnormalities. There is mild left ventricular hypertrophy. Left ventricular diastolic parameters are indeterminate.  2. Right ventricular systolic function is normal. The right ventricular size is normal. There is severely elevated pulmonary artery systolic pressure. The estimated right ventricular systolic pressure is 63.7 mmHg.  3. Trivial mitral valve regurgitation. Mild mitral stenosis. Severe mitral annular calcification.  4. The aortic valve is tricuspid. Aortic valve regurgitation is not visualized. FINDINGS  Left Ventricle: Left ventricular ejection fraction, by estimation, is 70 to 75%. The left ventricle has hyperdynamic function. The left ventricle has no regional wall motion abnormalities. The left ventricular internal cavity size was normal in size. There is mild left ventricular hypertrophy. Left ventricular diastolic parameters are indeterminate. Right Ventricle: The right ventricular size is normal. No increase in right ventricular wall thickness. Right  ventricular systolic function is normal. There is severely elevated pulmonary artery systolic pressure. The tricuspid regurgitant velocity is 3.83 m/s, and with an assumed right atrial pressure of 5 mmHg, the estimated right ventricular systolic pressure is 63.7 mmHg. Left Atrium:  Left atrial size was normal in size. Right Atrium: Right atrial size was normal in size. Pericardium: There is no evidence of pericardial effusion. Mitral Valve: There is mild thickening of the mitral valve leaflet(s). Severe mitral annular calcification. Trivial mitral valve regurgitation. Mild mitral valve stenosis. MV peak gradient, 16.5 mmHg. The mean mitral valve gradient is 6.0 mmHg. Tricuspid Valve: The tricuspid valve is normal in structure. Tricuspid valve regurgitation is mild. Aortic Valve: The aortic valve is tricuspid. Aortic valve regurgitation is not visualized. Aortic valve mean gradient measures 5.0 mmHg. Aortic valve peak gradient measures 7.8 mmHg. Aortic valve area, by VTI measures 1.81 cm. Pulmonic Valve: The pulmonic valve was not well visualized. Pulmonic valve regurgitation is trivial. Aorta: The aortic root and ascending aorta are structurally normal, with no evidence of dilitation. Venous: The inferior vena cava was not well visualized. IAS/Shunts: The atrial septum is grossly normal. Additional Comments: There is pleural effusion in the left lateral region.  LEFT VENTRICLE PLAX 2D LVIDd:         3.50 cm   Diastology LVIDs:         2.60 cm   LV e' medial:    4.68 cm/s LV PW:         1.10 cm   LV E/e' medial:  31.6 LV IVS:        1.30 cm   LV e' lateral:   6.74 cm/s LVOT diam:     2.00 cm   LV E/e' lateral: 22.0 LV SV:         54 LV SV Index:   33 LVOT Area:     3.14 cm  RIGHT VENTRICLE RV Basal diam:  2.80 cm RV Mid diam:    2.70 cm LEFT ATRIUM             Index        RIGHT ATRIUM           Index LA diam:        4.40 cm 2.70 cm/m   RA Area:     15.30 cm LA Vol (A2C):   32.7 ml 20.04 ml/m  RA Volume:   40.60  ml  24.88 ml/m LA Vol (A4C):   39.2 ml 24.02 ml/m LA Biplane Vol: 36.2 ml 22.19 ml/m  AORTIC VALVE AV Area (Vmax):    1.61 cm AV Area (Vmean):   1.50 cm AV Area (VTI):     1.81 cm AV Vmax:           140.00 cm/s AV Vmean:          102.350 cm/s AV VTI:            0.298 m AV Peak Grad:      7.8 mmHg AV Mean Grad:      5.0 mmHg LVOT Vmax:         71.90 cm/s LVOT Vmean:        48.900 cm/s LVOT VTI:          0.171 m LVOT/AV VTI ratio: 0.57  AORTA Ao Root diam: 2.50 cm MITRAL VALVE                TRICUSPID VALVE MV Area (PHT): 2.36 cm     TR Peak grad:   58.7 mmHg MV Area VTI:   0.78 cm     TR Vmax:        383.00 cm/s MV Peak grad:  16.5 mmHg MV Mean grad:  6.0 mmHg  SHUNTS MV Vmax:       2.03 m/s     Systemic VTI:  0.17 m MV Vmean:      113.0 cm/s   Systemic Diam: 2.00 cm MV Decel Time: 322 msec MV E velocity: 148.00 cm/s MV A velocity: 121.00 cm/s MV E/A ratio:  1.22 Keller Paterson Electronically signed by Keller Paterson Signature Date/Time: 09/15/2023/1:31:34 PM    Final         Scheduled Meds:  budesonide -glycopyrrolate -formoterol   2 puff Inhalation BID   feeding supplement  237 mL Oral BID BM   folic acid   1 mg Oral Daily   LORazepam   0.5 mg Oral QHS   losartan   25 mg Oral Daily   metoCLOPramide   10 mg Oral TID AC   mirtazapine   15 mg Oral QHS   multivitamin with minerals  1 tablet Oral Daily   pantoprazole   40 mg Oral Daily   Continuous Infusions:  heparin  950 Units/hr (09/15/23 1512)     LOS: 4 days      Calvin KATHEE Robson, MD [facility]   If 7PM-7AM, please contact night-coverage  09/16/2023, 1:50 PM

## 2023-09-17 DIAGNOSIS — I442 Atrioventricular block, complete: Secondary | ICD-10-CM | POA: Diagnosis not present

## 2023-09-17 LAB — BASIC METABOLIC PANEL WITH GFR
Anion gap: 9 (ref 5–15)
BUN: 18 mg/dL (ref 8–23)
CO2: 23 mmol/L (ref 22–32)
Calcium: 8.7 mg/dL — ABNORMAL LOW (ref 8.9–10.3)
Chloride: 104 mmol/L (ref 98–111)
Creatinine, Ser: 0.61 mg/dL (ref 0.44–1.00)
GFR, Estimated: >60 mL/min (ref >60)
Glucose, Bld: 103 mg/dL — ABNORMAL HIGH (ref 70–99)
Potassium: 4.6 mmol/L (ref 3.5–5.1)
Sodium: 136 mmol/L (ref 135–145)

## 2023-09-17 LAB — CBC
HCT: 35 % — ABNORMAL LOW (ref 36.0–46.0)
Hemoglobin: 11.4 g/dL — ABNORMAL LOW (ref 12.0–15.0)
MCH: 29 pg (ref 26.0–34.0)
MCHC: 32.6 g/dL (ref 30.0–36.0)
MCV: 89.1 fL (ref 80.0–100.0)
Platelets: 295 K/uL (ref 150–400)
RBC: 3.93 MIL/uL (ref 3.87–5.11)
RDW: 15.7 % — ABNORMAL HIGH (ref 11.5–15.5)
WBC: 15.3 K/uL — ABNORMAL HIGH (ref 4.0–10.5)
nRBC: 0 % (ref 0.0–0.2)

## 2023-09-17 LAB — SURGICAL PCR SCREEN
MRSA, PCR: NEGATIVE
Staphylococcus aureus: NEGATIVE

## 2023-09-17 LAB — HEPARIN LEVEL (UNFRACTIONATED): Heparin Unfractionated: 0.46 [IU]/mL (ref 0.30–0.70)

## 2023-09-17 MED ORDER — DEXAMETHASONE SODIUM PHOSPHATE 10 MG/ML IJ SOLN
4.0000 mg | INTRAMUSCULAR | Status: DC
  Administered 2023-09-17 – 2023-09-21 (×5): 4 mg via INTRAVENOUS
  Filled 2023-09-17 (×5): qty 1

## 2023-09-17 MED ORDER — SODIUM CHLORIDE 0.9 % IV SOLN: INTRAVENOUS | Status: DC

## 2023-09-17 NOTE — NC FL2 (Signed)
 [place]  MEDICAID FL2 LEVEL OF CARE FORM     IDENTIFICATION  Patient Name: [name] Birthdate: January 07, 1934 Sex: female Admission Date (Current Location): 09/12/2023  [facility] and [place] Number:  Chiropodist and Address:  [facility], [address], [place], [place] [address]      Provider Number: [id number]  Attending Physician Name and Address:  Jhonny Calvin NOVAK, MD  Relative Name and Phone Number:  Kally Cadden [phone]    Current Level of Care: Hospital Recommended Level of Care: Skilled Nursing Facility Prior Approval Number:    Date Approved/Denied:   PASRR Number: [id number]  Discharge Plan: SNF    Current Diagnoses: Patient Active Problem List   Diagnosis Date Noted   Malnutrition of moderate degree 09/14/2023   Anorexia 09/14/2023   Nausea and vomiting 09/14/2023   Symptomatic bradycardia 09/13/2023   Complete heart block (HCC) 09/12/2023   Myocardial injury 09/12/2023   Atrial fibrillation, chronic (HCC) 09/12/2023   Closed fracture of right femur, unspecified fracture morphology, sequela 08/17/2023   Hip pain, acute, right 08/16/2023   Acute on chronic heart failure with preserved ejection fraction (HFpEF) (HCC) 07/19/2023   Hypokalemia 07/19/2023   Atrial fibrillation (HCC) 07/18/2023   Palpitations 05/18/2023   Leukocytosis 05/18/2023   Demand ischemia (HCC) 05/18/2023   (HFpEF) heart failure with preserved ejection fraction (HCC) 05/18/2023   Psoriatic arthritis (HCC) 05/18/2023   Chronic respiratory failure with hypoxia (HCC) 06/29/2022   Chronic nausea 12/09/2020   Closed intracapsular fracture of left femur with routine healing 08/28/2020   Acquired thrombophilia (HCC) 07/15/2020   Paroxysmal atrial fibrillation with RVR (HCC) 06/19/2020   Atrial fibrillation with rapid ventricular response (HCC) 06/09/2020   Fracture of femoral neck, left, closed (HCC) 06/05/2020   Atherosclerosis of aorta (HCC)  01/09/2020   Chronic obstructive pulmonary disease (HCC) 01/09/2020   HSV (herpes simplex virus) infection 01/09/2020   BCC (basal cell carcinoma), face 10/12/2017   Environmental and seasonal allergies 10/30/2014   Psoriasis vulgaris 10/30/2014   Primary hypertension 10/30/2014   Gastro-esophageal reflux disease without esophagitis 10/30/2014   Hot flash, menopausal 10/30/2014   Hyperlipidemia, mixed 10/30/2014   History of cancer of left breast 10/30/2014   Bronchiectasis (HCC) 09/24/2013    Orientation RESPIRATION BLADDER Height & Weight     Self, Time, Situation, Place  Normal Incontinent Weight: 115 lb 9.6 oz (52.4 kg) (standing scale) Height:  5' 2 (157.5 cm)  BEHAVIORAL SYMPTOMS/MOOD NEUROLOGICAL BOWEL NUTRITION STATUS      Incontinent, Continent Diet (Diet 2 gram sodium Room service appropriate? No; Fluid consistency: Thin: Sodium Restricted starting at 07/12 0744)  AMBULATORY STATUS COMMUNICATION OF NEEDS Skin   Limited Assist (Two person assist) Verbally Skin abrasions, Bruising, Surgical wounds (Surgical Wound. Groin/Buttocks)                       Personal Care Assistance Level of Assistance  Bathing, Feeding, Dressing Bathing Assistance: Limited assistance Feeding assistance: Limited assistance Dressing Assistance: Limited assistance     Functional Limitations Info  Sight, Hearing, Speech Sight Info: Adequate Hearing Info: Adequate Speech Info: Adequate    SPECIAL CARE FACTORS FREQUENCY  PT (By licensed PT), OT (By licensed OT)     PT Frequency: 5x OT Frequency: 5x            Contractures      Additional Factors Info  Code Status, Allergies Code Status Info: FuLL Allergies Info: Amoxicillin ; Eliquis   Current Medications (09/17/2023):  This is the current hospital active medication list Current Facility-Administered Medications  Medication Dose Route Frequency Provider Last Rate Last Admin   acetaminophen  (TYLENOL ) tablet 650 mg   650 mg Oral Q6H PRN Niu, Xilin, MD       albuterol  (PROVENTIL ) (2.5 MG/3ML) 0.083% nebulizer solution 2.5 mg  2.5 mg Inhalation Q4H PRN Niu, Xilin, MD       atropine  1 MG/10ML injection 1 mg  1 mg Intravenous PRN Niu, Xilin, MD       budesonide -glycopyrrolate -formoterol  (BREZTRI ) 160-9-4.8 MCG/ACT inhaler 2 puff  2 puff Inhalation BID Niu, Xilin, MD   2 puff at 09/17/23 0818   dextromethorphan-guaiFENesin  (MUCINEX  DM) 30-600 MG per 12 hr tablet 1 tablet  1 tablet Oral BID PRN Niu, Xilin, MD       diphenhydrAMINE  (BENADRYL ) injection 12.5 mg  12.5 mg Intravenous Q8H PRN Niu, Xilin, MD       docusate sodium  (COLACE) capsule 100 mg  100 mg Oral BID PRN Niu, Xilin, MD       feeding supplement (ENSURE PLUS HIGH PROTEIN) liquid 237 mL  237 mL Oral BID BM Sreenath, Sudheer B, MD   237 mL at 09/15/23 1353   folic acid  (FOLVITE ) tablet 1 mg  1 mg Oral Daily Niu, Xilin, MD   1 mg at 09/17/23 0818   heparin  ADULT infusion 100 units/mL (25000 units/250mL)  950 Units/hr Intravenous Continuous Dail Rankin RAMAN, RPH 9.5 mL/hr at 09/15/23 1512 950 Units/hr at 09/15/23 1512   hydrALAZINE  (APRESOLINE ) injection 5 mg  5 mg Intravenous Q2H PRN Niu, Xilin, MD   5 mg at 09/17/23 0817   HYDROcodone -acetaminophen  (NORCO/VICODIN) 5-325 MG per tablet 1-2 tablet  1-2 tablet Oral Q4H PRN Niu, Xilin, MD       LORazepam  (ATIVAN ) tablet 0.5 mg  0.5 mg Oral QHS Niu, Xilin, MD   0.5 mg at 09/15/23 2200   losartan  (COZAAR ) tablet 50 mg  50 mg Oral Daily Sreenath, Sudheer B, MD   50 mg at 09/17/23 0818   metoCLOPramide  (REGLAN ) tablet 10 mg  10 mg Oral TID AC Sreenath, Sudheer B, MD   10 mg at 09/17/23 0818   mirtazapine  (REMERON ) tablet 15 mg  15 mg Oral QHS Sreenath, Sudheer B, MD   15 mg at 09/15/23 2200   multivitamin with minerals tablet 1 tablet  1 tablet Oral Daily Sreenath, Sudheer B, MD   1 tablet at 09/17/23 0818   ondansetron  (ZOFRAN ) injection 4 mg  4 mg Intravenous Q6H PRN Sreenath, Sudheer B, MD       pantoprazole   (PROTONIX ) EC tablet 40 mg  40 mg Oral Daily Sreenath, Sudheer B, MD   40 mg at 09/17/23 0818     Discharge Medications: Please see discharge summary for a list of discharge medications.  Relevant Imaging Results:  Relevant Lab Results:   Additional Information [id number]  [place] L Marquite Attwood, LCSW

## 2023-09-17 NOTE — Progress Notes (Signed)
 PROGRESS NOTE    [name]  FMW:[id number] DOB: 20-May-1933 DOA: 09/12/2023 PCP: Cleotilde Oneil FALCON, MD    Brief Narrative:  89 y.o. female with medical history significant of HTN, HLD, HFpEF, A-fib on Pradaxa , COPD, bronchiectasis, GERD, breast cancer s/p of a left lumpectomy), Psoriatec arthritis, mild pulmonary hypertension, recent right hip fracture, who presents with weakness.   Patient was recently hospitalized from 6/11 - 6/16 due to mechanical fall and right hip fracture.  Patient was s/p of right hip surgery.  Patient states that her surgical site has been healing well.  In the past several days she has been feeling generalized weak, with poor appetite and decreased oral intake, cannot ambulate without assistance due to weakness.  No fall.  She has nausea, no vomiting, diarrhea or abdominal pain.  No chest pain, cough, SOB.  Denies symptoms of UTI.  No rectal bleeding or dark stool.  Per her husband, patient's heart rate was at 40s when he checked it for her at home.   Pt was found to have new complete heart block on EKG with heart rate in 40s in ED. Pt is taking amiodarone  200 mg daily and metoprolol  37.5 mg twice daily for A-fib currently.    Assessment & Plan:   Principal Problem:   Complete heart block (HCC) Active Problems:   Atrial fibrillation, chronic (HCC)   Myocardial injury   (HFpEF) heart failure with preserved ejection fraction (HCC)   Primary hypertension   Leukocytosis   Bronchiectasis (HCC)   Chronic obstructive pulmonary disease (HCC)   Psoriasis vulgaris   Psoriatic arthritis (HCC)   Symptomatic bradycardia   Malnutrition of moderate degree   Anorexia   Nausea and vomiting  High degree heart block Symptomatic bradycardia Currently hemodynamically stable.  No chest pain or shortness of breath.  Heart rate 40.  EDP consulted with cardiology who recommend holding metoprolol  and amiodarone .  Remains in heart block.  Was previously refusing PPM but now  agreeable upon repeat conversation. Plan: Continue telemetry Continue holding amiodarone  and metoprolol  Cardiology following Pacemaker plan Monday 7/14 N.p.o. after midnight  Chronic A-fib: Currently in complete heart block.   Amiodarone  and metoprolol  on hold Continue telemetry as above Continue heparin  GTT   Myocardial injury:  trop 21.  Patient does not have chest pain or SOB.   Likely demand ischemia. EKG telemetry  (HFpEF) heart failure with preserved ejection fraction (HCC):  2D echo on 05/19/2023 showed EF 60 to 65% with grade 2 diastolic dysfunction.  Patient does not have leg edema or JVD.  No SOB.  CHF seems to be compensated. - Continue to hold diuretic   Primary hypertension HCTZ on hold Increase losartan  to 50 mg daily IV hydralazine  as needed.     Leukocytosis:  Appears to be WBC 23.2, no source infection identified so far.   Appears to be chronic issue .  No indication of infection.  Continue to hold antibiotics.   Bronchiectasis (HCC) and Chronic obstructive pulmonary disease (HCC):  stable.  No wheezing, no SOB. - Continue bronchodilators and as needed Mucinex    Hx of psoriasis vulgaris and psoriatic arthritis (HCC): - Patient is on weekly methotrexate  (every Sunday).  Defer to outpatient  Dry heaving Poor p.o. intake Intractable nausea Much improved.  Appears to be medication related.  Possibly secondary to amiodarone .  Amiodarone  on hold and oral intake is much improved.  GI consult requested but have signed off.  No plans for inpatient endoscopy.  On p.o. PPI and  p.o. Reglan .  Will continue.   DVT prophylaxis: IV heparin  Code Status: Full Family Communication: Husband at bedside 7/9, 7/10, 7/11, 7/12, 7/13.  Daughter at bedside 7/10, 7/11 Disposition Plan: Status is: Inpatient Remains inpatient appropriate because: Multiple acute issues as above   Level of care: Telemetry Cardiac  Consultants:  Cardiology  Procedures:   None  Antimicrobials: None   Subjective: Seen and examined.  Appears withdrawn today.  Objective: Vitals:   09/17/23 0330 09/17/23 0405 09/17/23 0807 09/17/23 0835  BP: (!) 196/53  (!) 217/63 (!) 166/44  Pulse: (!) 48  (!) 50   Resp: 20     Temp: 97.9 F (36.6 C)  (!) 97.5 F (36.4 C)   TempSrc:      SpO2: 92%  93%   Weight:  52.4 kg    Height:        Intake/Output Summary (Last 24 hours) at 09/17/2023 1154 Last data filed at 09/17/2023 0900 Gross per 24 hour  Intake 300 ml  Output 450 ml  Net -150 ml   Filed Weights   09/15/23 0500 09/16/23 0500 09/17/23 0405  Weight: 62.5 kg 58.6 kg 52.4 kg    Examination:  General exam: No acute distress.  Appears fatigued Respiratory system: Poor respiratory effort.  Normal work of breathing.  Room air Cardiovascular system: S1-S2, bradycardic, irregular rhythm, no murmurs, no pedal edema Gastrointestinal system: Soft, NT/ND, normal bowel sounds Central nervous system: Alert and oriented. No focal neurological deficits. Extremities: Decreased power bilateral lower extremities Skin: No rashes, lesions or ulcers Psychiatry: Judgement and insight appear impaired. Mood & affect flattened.     Data Reviewed: I have personally reviewed following labs and imaging studies  CBC: Recent Labs  Lab 09/12/23 1745 09/13/23 0353 09/14/23 0343 09/15/23 0640 09/16/23 0428 09/17/23 0714  WBC 23.2* 21.5* 17.4* 14.5* 13.8* 15.3*  NEUTROABS 19.9*  --   --   --   --   --   HGB 14.1 12.2 11.6* 11.5* 11.6* 11.4*  HCT 41.7 37.1 35.2* 33.9* 35.9* 35.0*  MCV 87.8 89.4 88.9 88.3 90.0 89.1  PLT 273 230 229 231 251 295   Basic Metabolic Panel: Recent Labs  Lab 09/12/23 1745 09/13/23 0353 09/14/23 0343 09/15/23 0640 09/17/23 0714  NA 135 136 137 137 136  K 3.5 2.8* 3.7 4.8 4.6  CL 98 99 102 105 104  CO2 24 23 23 23 23   GLUCOSE 120* 92 124* 110* 103*  BUN 22 19 30* 24* 18  CREATININE 0.64 0.51 0.47 0.57 0.61  CALCIUM  8.9 7.9*  8.3* 8.5* 8.7*  MG 1.9  --  1.8 2.4  --    GFR: Estimated Creatinine Clearance: 39.2 mL/min (by C-G formula based on SCr of 0.61 mg/dL). Liver Function Tests: Recent Labs  Lab 09/12/23 1745  AST 20  ALT 12  ALKPHOS 87  BILITOT 1.0  PROT 7.2  ALBUMIN 3.3*   No results for input(s): LIPASE, AMYLASE in the last 168 hours. No results for input(s): AMMONIA in the last 168 hours. Coagulation Profile: Recent Labs  Lab 09/12/23 2322  INR 1.3*   Cardiac Enzymes: No results for input(s): CKTOTAL, CKMB, CKMBINDEX, TROPONINI in the last 168 hours. BNP (last 3 results) No results for input(s): PROBNP in the last 8760 hours. HbA1C: No results for input(s): HGBA1C in the last 72 hours. CBG: No results for input(s): GLUCAP in the last 168 hours. Lipid Profile: No results for input(s): CHOL, HDL, LDLCALC, TRIG, CHOLHDL, LDLDIRECT in the last  72 hours. Thyroid  Function Tests: No results for input(s): TSH, T4TOTAL, FREET4, T3FREE, THYROIDAB in the last 72 hours.  Anemia Panel: No results for input(s): VITAMINB12, FOLATE, FERRITIN, TIBC, IRON , RETICCTPCT in the last 72 hours. Sepsis Labs: No results for input(s): PROCALCITON, LATICACIDVEN in the last 168 hours.  No results found for this or any previous visit (from the past 240 hours).       Radiology Studies: DG HIP PORT UNILAT WITH PELVIS 1V RIGHT Result Date: 09/15/2023 CLINICAL DATA:  Right hip pain, postop 1 month EXAM: DG HIP (WITH OR WITHOUT PELVIS) 1V PORT RIGHT COMPARISON:  Radiographs 08/16/2023 FINDINGS: Screw fixation of both femoral necks. No radiographic evidence of loosening. No acute fracture or dislocation. Degenerative changes pubic symphysis, both hips, SI joints and lower lumbar spine. IMPRESSION: Screw fixation of both femoral necks. No radiographic evidence of loosening. No acute fracture. Electronically Signed   By: Norman Gatlin M.D.   On: 09/15/2023  19:46        Scheduled Meds:  budesonide -glycopyrrolate -formoterol   2 puff Inhalation BID   dexamethasone  (DECADRON ) injection  4 mg Intravenous Q24H   feeding supplement  237 mL Oral BID BM   folic acid   1 mg Oral Daily   LORazepam   0.5 mg Oral QHS   losartan   50 mg Oral Daily   metoCLOPramide   10 mg Oral TID AC   mirtazapine   15 mg Oral QHS   multivitamin with minerals  1 tablet Oral Daily   pantoprazole   40 mg Oral Daily   Continuous Infusions:  heparin  950 Units/hr (09/15/23 1512)     LOS: 5 days      Olivia KATHEE Robson, MD [facility]   If 7PM-7AM, please contact night-coverage  09/17/2023, 11:54 AM

## 2023-09-17 NOTE — Progress Notes (Signed)
 [facility] CLINIC CARDIOLOGY PROGRESS NOTE       Patient ID: [name] MRN: [id number] DOB/AGE: 05-23-1933 89 y.o.  Admit date: 09/12/2023 Referring Physician Dr. Hilma Primary Physician Cleotilde Oneil FALCON, MD Primary Cardiologist Tinnie Maiden, NP; Dr. Dewane Reason for Consultation complete heart block  HPI: [name] is a 89 y.o. female  with a past medical history of history bradycardia, hypokalemia, paroxysmal atrial fibrillation (on Pradaxa ), chronic HFpEF, mild pulmonary hypertension, hypertension, hyperlipidemia, pulmonary fibrosis, COPD who presented to the ED on 09/12/2023 for worsening generalized weakness and poor intake. Of note patient had a recent fall that resulted in right hip fracture and 08/16/2023 and recently got out of rehab about a week ago.  Heart rates were found to be in the 40s.  Cardiology was consulted for further evaluation.   Interval History: -Patient seen and examined this AM, sitting upright in hospital bed.  Patient states she feels okay and denies chest pain, lightheadedness or dizziness.  - Continues to have poor p.o. intake but this is slightly improved from admission. - BP remains elevated, heart rate stable in the 40-50s. Patient amenable to proceeding with pacemaker placement. -Palliative following, appreciate their help with GOC conversations with patient and family.   Pertinent Cardiac History (Most recent) 3 day Holter monitor:08/01/2023 to 08/04/2023 Predominant rhythm is sinus rhythm. Maximum heart rate 141 bpm, minimum heart rate 57 bpm the average heart rate of 74 bpm Occasional less than 1% PACs and rare PVCs noted 8 occurrences of short lasting supraventricular tachycardia, longest episode 17 beats 0 patient triggers  Impression: Predominant rhythm is sinus rhythm. No significant arrhythmias.   Review of systems complete and found to be negative unless listed above    Past Medical History:  Diagnosis Date   Acquired thrombophilia  (HCC)    Acute metabolic encephalopathy 06/19/2020   Allergy    Atherosclerosis of aorta (HCC)    Atrial fibrillation (HCC)    Bronchiectasis (HCC)    Cancer (HCC)    left breast cancer    Chronic nausea    COPD (chronic obstructive pulmonary disease) (HCC)    GERD (gastroesophageal reflux disease)    Grade II diastolic dysfunction    Hyperlipemia    Hypertension    Intractable nausea and vomiting 06/20/2018   Mild concentric left ventricular hypertrophy (LVH)    Mild mitral stenosis by prior echocardiogram    Mild pulmonary hypertension (HCC)    Psoriatic arthritis (HCC)    Stage 2 moderate COPD by GOLD classification [facility])     Past Surgical History:  Procedure Laterality Date   BREAST LUMPECTOMY Left 2008   CATARACT EXTRACTION W/PHACO Left 02/13/2023   Procedure: CATARACT EXTRACTION PHACO AND INTRAOCULAR LENS PLACEMENT (IOC) LEFT  KDB GONIOTOMY;  Surgeon: Enola Feliciano Hugger, MD;  Location: [facility];  Service: Ophthalmology;  Laterality: Left;  10.53 0:55.   CATARACT EXTRACTION W/PHACO Right 02/27/2023   Procedure: CATARACT EXTRACTION PHACO AND INTRAOCULAR LENS PLACEMENT (IOC) RIGHT  [place], goniotomy 15.90 01:28.6;  Surgeon: Enola Feliciano Hugger, MD;  Location: [facility];  Service: Ophthalmology;  Laterality: Right;   CHOLECYSTECTOMY     HIP PINNING,CANNULATED Left 06/05/2020   Procedure: CANNULATED HIP PINNING;  Surgeon: Kathlynn Sharper, MD;  Location: [facility] OR[facility];  Service: Orthopedics;  Laterality: Left;   HIP PINNING,CANNULATED Right 08/16/2023   Procedure: FIXATION, FEMUR, NECK, PERCUTANEOUS, USING SCREW;  Surgeon: Edie Norleen PARAS, MD;  Location: [facility] OR[facility];  Service: Orthopedics;  Laterality: Right;   PARTIAL HYSTERECTOMY  TEE WITHOUT CARDIOVERSION N/A 06/10/2020   Procedure: TRANSESOPHAGEAL ECHOCARDIOGRAM (TEE);  Surgeon: Darliss Rogue, MD;  Location: [facility] OR[facility];  Service: Cardiovascular;  Laterality: N/A;    Medications Prior to Admission  Medication  Sig Dispense Refill Last Dose/Taking   amiodarone  (PACERONE ) 200 MG tablet Take 1 tablet (200 mg total) by mouth daily. 30 tablet 0 09/12/2023 Morning   dabigatran  (PRADAXA ) 150 MG CAPS capsule Take 1 capsule (150 mg total) by mouth every 12 (twelve) hours. 60 capsule 0 09/12/2023 at  8:00 AM   docusate sodium  (COLACE) 100 MG capsule Take 1 capsule (100 mg total) by mouth 2 (two) times daily. 10 capsule 0 Past Month   Fluticasone -Umeclidin-Vilant (TRELEGY ELLIPTA) 100-62.5-25 MCG/ACT AEPB Inhale 1 puff into the lungs daily.   09/12/2023 Morning   folic acid  (FOLVITE ) 1 MG tablet Take 1 mg by mouth daily. Pt takes this med the days she does not take methotrexate    09/12/2023 Morning   hydrochlorothiazide  (HYDRODIURIL ) 25 MG tablet Take 1 tablet by mouth daily.   09/12/2023 Morning   HYDROcodone -acetaminophen  (NORCO/VICODIN) 5-325 MG tablet Take 1-2 tablets by mouth every 4 (four) hours as needed for moderate pain (pain score 4-6). 30 tablet 0 09/12/2023   LORazepam  (ATIVAN ) 0.5 MG tablet Take 1 tablet (0.5 mg total) by mouth at bedtime. 10 tablet 0 09/12/2023   methotrexate  (RHEUMATREX) 2.5 MG tablet Take 5 mg by mouth once a week. Caution:Chemotherapy. Protect from light. Pt take 2 weekly.   09/10/2023   metoCLOPramide  (REGLAN ) 10 MG tablet Take 1 tablet (10 mg total) by mouth 4 (four) times daily -  before meals and at bedtime. 120 tablet 0 09/12/2023 Morning   metoprolol  tartrate (LOPRESSOR ) 25 MG tablet Take 1.5 tablets (37.5 mg total) by mouth 2 (two) times daily. 90 tablet 0 Past Week   omeprazole  (PRILOSEC) 40 MG capsule Take 40 mg by mouth daily.   09/12/2023 Morning   ondansetron  (ZOFRAN ) 4 MG tablet Take 4 mg by mouth every 8 (eight) hours as needed for nausea or vomiting.   Unknown   [Paused] furosemide  (LASIX ) 40 MG tablet Take 1 tablet (40 mg total) by mouth daily. 30 tablet 0    Social History   Socioeconomic History   Marital status: Married    Spouse name: Not on file   Number of children: 2    Years of education: Not on file   Highest education level: Not on file  Occupational History   Not on file  Tobacco Use   Smoking status: Never   Smokeless tobacco: Never  Vaping Use   Vaping status: Never Used  Substance and Sexual Activity   Alcohol use: No   Drug use: No   Sexual activity: Not on file  Other Topics Concern   Not on file  Social History Narrative   Not on file   Social Drivers of Health   Financial Resource Strain: Low Risk  (08/10/2023)   Received from [facility]   Overall Financial Resource Strain (CARDIA)    Difficulty of Paying Living Expenses: Not hard at all  Food Insecurity: No Food Insecurity (09/14/2023)   Hunger Vital Sign    Worried About Running Out of Food in the Last Year: Never true    Ran Out of Food in the Last Year: Never true  Transportation Needs: No Transportation Needs (09/14/2023)   PRAPARE - Administrator, Civil Service (Medical): No    Lack of Transportation (Non-Medical): No  Physical  Activity: Sufficiently Active (02/17/2021)   Exercise Vital Sign    Days of Exercise per Week: 7 days    Minutes of Exercise per Session: 30 min  Stress: No Stress Concern Present (02/17/2021)   [employer] of Occupational Health - Occupational Stress Questionnaire    Feeling of Stress : Not at all  Social Connections: Moderately Isolated (09/14/2023)   Social Connection and Isolation Panel    Frequency of Communication with Friends and Family: More than three times a week    Frequency of Social Gatherings with Friends and Family: Three times a week    Attends Religious Services: Never    Active Member of Clubs or Organizations: No    Attends Banker Meetings: Never    Marital Status: Married  Catering manager Violence: Not At Risk (09/14/2023)   Humiliation, Afraid, Rape, and Kick questionnaire    Fear of Current or Ex-Partner: No    Emotionally Abused: No    Physically Abused: No    Sexually  Abused: No    Family History  Problem Relation Age of Onset   Breast cancer Sister      Vitals:   09/16/23 1952 09/17/23 0039 09/17/23 0330 09/17/23 0405  BP: (!) 169/52 (!) 170/52 (!) 196/53   Pulse: (!) 49 (!) 46 (!) 48   Resp: 20 20 20    Temp: (!) 97.5 F (36.4 C) 97.7 F (36.5 C) 97.9 F (36.6 C)   TempSrc: Oral Oral    SpO2: 95% 95% 92%   Weight:    52.4 kg  Height:        PHYSICAL EXAM General: Chronically ill-appearing elderly female, well nourished, in no acute distress. HEENT: Normocephalic and atraumatic. Neck: No JVD.   Lungs: Normal respiratory effort on room air. Clear bilaterally to auscultation. No wheezes, crackles, rhonchi.  Heart: HRR, slow rates. Normal S1 and S2 without gallops or murmurs.  Abdomen: Non-distended appearing.  Msk: Normal strength and tone for age. Extremities: Warm and well perfused. No clubbing, cyanosis, edema.  Neuro: Alert and oriented X 3. Psych: Answers questions appropriately.   Labs: Basic Metabolic Panel: Recent Labs    09/15/23 0640 09/17/23 0714  NA 137 136  K 4.8 4.6  CL 105 104  CO2 23 23  GLUCOSE 110* 103*  BUN 24* 18  CREATININE 0.57 0.61  CALCIUM  8.5* 8.7*  MG 2.4  --    Liver Function Tests: No results for input(s): AST, ALT, ALKPHOS, BILITOT, PROT, ALBUMIN in the last 72 hours.  No results for input(s): LIPASE, AMYLASE in the last 72 hours. CBC: Recent Labs    09/16/23 0428 09/17/23 0714  WBC 13.8* 15.3*  HGB 11.6* 11.4*  HCT 35.9* 35.0*  MCV 90.0 89.1  PLT 251 295   Cardiac Enzymes: No results for input(s): CKTOTAL, CKMB, CKMBINDEX, TROPONINIHS in the last 72 hours.  BNP: No results for input(s): BNP in the last 72 hours.  D-Dimer: No results for input(s): DDIMER in the last 72 hours. Hemoglobin A1C: No results for input(s): HGBA1C in the last 72 hours. Fasting Lipid Panel: No results for input(s): CHOL, HDL, LDLCALC, TRIG, CHOLHDL, LDLDIRECT in  the last 72 hours. Thyroid  Function Tests: No results for input(s): TSH, T4TOTAL, T3FREE, THYROIDAB in the last 72 hours.  Invalid input(s): FREET3  Anemia Panel: No results for input(s): VITAMINB12, FOLATE, FERRITIN, TIBC, IRON , RETICCTPCT in the last 72 hours.   Radiology: DG HIP PORT UNILAT WITH PELVIS 1V RIGHT Result Date: 09/15/2023 CLINICAL DATA:  Right hip pain, postop 1 month EXAM: DG HIP (WITH OR WITHOUT PELVIS) 1V PORT RIGHT COMPARISON:  Radiographs 08/16/2023 FINDINGS: Screw fixation of both femoral necks. No radiographic evidence of loosening. No acute fracture or dislocation. Degenerative changes pubic symphysis, both hips, SI joints and lower lumbar spine. IMPRESSION: Screw fixation of both femoral necks. No radiographic evidence of loosening. No acute fracture. Electronically Signed   By: Norman Gatlin M.D.   On: 09/15/2023 19:46   ECHOCARDIOGRAM COMPLETE Result Date: 09/15/2023    ECHOCARDIOGRAM REPORT   Patient Name:   [name] Santa Date of Exam: 09/15/2023 Medical Rec #:  [id number]     Height:       62.0 in Accession #:    [id number]    Weight:       137.8 lb Date of Birth:  05/05/1933     BSA:          1.632 m Patient Age:    89 years      BP:           167/48 mmHg Patient Gender: F             HR:           42 bpm. Exam Location:  [facility] Procedure: 2D Echo, Cardiac Doppler and Color Doppler (Both Spectral and Color            Flow Doppler were utilized during procedure). Indications:     Abnormal ECG R94.31  History:         Patient has prior history of Echocardiogram examinations, most                  recent 05/19/2023. Arrythmias:Atrial Fibrillation; Risk                  Factors:Hypertension.  Sonographer:     Christopher Furnace Referring Phys:  [id number] GABRIELLA DECOSTE Diagnosing Phys: Keller Alluri IMPRESSIONS  1. Left ventricular ejection fraction, by estimation, is 70 to 75%. The left ventricle has hyperdynamic function. The left ventricle has no regional wall  motion abnormalities. There is mild left ventricular hypertrophy. Left ventricular diastolic parameters are indeterminate.  2. Right ventricular systolic function is normal. The right ventricular size is normal. There is severely elevated pulmonary artery systolic pressure. The estimated right ventricular systolic pressure is 63.7 mmHg.  3. Trivial mitral valve regurgitation. Mild mitral stenosis. Severe mitral annular calcification.  4. The aortic valve is tricuspid. Aortic valve regurgitation is not visualized. FINDINGS  Left Ventricle: Left ventricular ejection fraction, by estimation, is 70 to 75%. The left ventricle has hyperdynamic function. The left ventricle has no regional wall motion abnormalities. The left ventricular internal cavity size was normal in size. There is mild left ventricular hypertrophy. Left ventricular diastolic parameters are indeterminate. Right Ventricle: The right ventricular size is normal. No increase in right ventricular wall thickness. Right ventricular systolic function is normal. There is severely elevated pulmonary artery systolic pressure. The tricuspid regurgitant velocity is 3.83 m/s, and with an assumed right atrial pressure of 5 mmHg, the estimated right ventricular systolic pressure is 63.7 mmHg. Left Atrium: Left atrial size was normal in size. Right Atrium: Right atrial size was normal in size. Pericardium: There is no evidence of pericardial effusion. Mitral Valve: There is mild thickening of the mitral valve leaflet(s). Severe mitral annular calcification. Trivial mitral valve regurgitation. Mild mitral valve stenosis. MV peak gradient, 16.5 mmHg. The mean mitral valve gradient is 6.0 mmHg. Tricuspid Valve: The tricuspid  valve is normal in structure. Tricuspid valve regurgitation is mild. Aortic Valve: The aortic valve is tricuspid. Aortic valve regurgitation is not visualized. Aortic valve mean gradient measures 5.0 mmHg. Aortic valve peak gradient measures 7.8 mmHg.  Aortic valve area, by VTI measures 1.81 cm. Pulmonic Valve: The pulmonic valve was not well visualized. Pulmonic valve regurgitation is trivial. Aorta: The aortic root and ascending aorta are structurally normal, with no evidence of dilitation. Venous: The inferior vena cava was not well visualized. IAS/Shunts: The atrial septum is grossly normal. Additional Comments: There is pleural effusion in the left lateral region.  LEFT VENTRICLE PLAX 2D LVIDd:         3.50 cm   Diastology LVIDs:         2.60 cm   LV e' medial:    4.68 cm/s LV PW:         1.10 cm   LV E/e' medial:  31.6 LV IVS:        1.30 cm   LV e' lateral:   6.74 cm/s LVOT diam:     2.00 cm   LV E/e' lateral: 22.0 LV SV:         54 LV SV Index:   33 LVOT Area:     3.14 cm  RIGHT VENTRICLE RV Basal diam:  2.80 cm RV Mid diam:    2.70 cm LEFT ATRIUM             Index        RIGHT ATRIUM           Index LA diam:        4.40 cm 2.70 cm/m   RA Area:     15.30 cm LA Vol (A2C):   32.7 ml 20.04 ml/m  RA Volume:   40.60 ml  24.88 ml/m LA Vol (A4C):   39.2 ml 24.02 ml/m LA Biplane Vol: 36.2 ml 22.19 ml/m  AORTIC VALVE AV Area (Vmax):    1.61 cm AV Area (Vmean):   1.50 cm AV Area (VTI):     1.81 cm AV Vmax:           140.00 cm/s AV Vmean:          102.350 cm/s AV VTI:            0.298 m AV Peak Grad:      7.8 mmHg AV Mean Grad:      5.0 mmHg LVOT Vmax:         71.90 cm/s LVOT Vmean:        48.900 cm/s LVOT VTI:          0.171 m LVOT/AV VTI ratio: 0.57  AORTA Ao Root diam: 2.50 cm MITRAL VALVE                TRICUSPID VALVE MV Area (PHT): 2.36 cm     TR Peak grad:   58.7 mmHg MV Area VTI:   0.78 cm     TR Vmax:        383.00 cm/s MV Peak grad:  16.5 mmHg MV Mean grad:  6.0 mmHg     SHUNTS MV Vmax:       2.03 m/s     Systemic VTI:  0.17 m MV Vmean:      113.0 cm/s   Systemic Diam: 2.00 cm MV Decel Time: 322 msec MV E velocity: 148.00 cm/s MV A velocity: 121.00 cm/s MV E/A ratio:  1.22 Keller Paterson Electronically signed by Keller  Alluri Signature  Date/Time: 09/15/2023/1:31:34 PM    Final    DG Chest Portable 1 View Result Date: 09/12/2023 CLINICAL DATA:  Weakness, shortness of breath EXAM: PORTABLE CHEST 1 VIEW COMPARISON:  07/18/2023 FINDINGS: Heart and mediastinal contours within normal limits. Aortic atherosclerosis. Right lung clear. Patchy opacity in the left lower lung again noted, improved since prior study. No visible effusions or acute bony abnormality. Biapical scarring. IMPRESSION: Persistent but improving left basilar atelectasis or infiltrate. No visible effusions. Electronically Signed   By: Franky Crease M.D.   On: 09/12/2023 19:30    ECHO as above   05/19/2023 1. Left ventricular ejection fraction, by estimation, is 60 to 65%. The left ventricle has normal function. The left ventricle has no regional wall motion abnormalities. There is mild left ventricular hypertrophy. Left ventricular diastolic parameters are consistent with Grade II diastolic dysfunction (pseudonormalization).   2. Right ventricular systolic function is normal. The right ventricular size is normal. There is mildly elevated pulmonary artery systolic pressure.   3. Left atrial size was mildly dilated.   4. The mitral valve is normal in structure. No evidence of mitral valve regurgitation. Mild mitral stenosis. The mean mitral valve gradient is 4.0 mmHg. Severe mitral annular calcification.   5. Tricuspid valve regurgitation is mild to moderate.   6. The aortic valve is normal in structure. Aortic valve regurgitation is not visualized. Aortic valve sclerosis is present, with no evidence of aortic valve stenosis.   7. The inferior vena cava is normal in size with greater than 50% respiratory variability, suggesting right atrial pressure of 3 mmHg.    TELEMETRY reviewed by me 09/17/2023: complete heart block, rate 40-50s   EKG reviewed by me:  complete heart block, rate 50 bpm with nonspecific ST-T wave changes  Data reviewed by me 09/17/2023: last 24h vitals  tele labs imaging I/O hospitalist progress notes, palliative notes  Principal Problem:   Complete heart block (HCC) Active Problems:   Psoriasis vulgaris   Primary hypertension   Bronchiectasis (HCC)   Chronic obstructive pulmonary disease (HCC)   Leukocytosis   (HFpEF) heart failure with preserved ejection fraction (HCC)   Psoriatic arthritis (HCC)   Myocardial injury   Atrial fibrillation, chronic (HCC)   Symptomatic bradycardia   Malnutrition of moderate degree   Anorexia   Nausea and vomiting    ASSESSMENT AND PLAN:  [name] is a 89 y.o. female  with a past medical history of history bradycardia, hypokalemia, paroxysmal atrial fibrillation (on Pradaxa ), chronic HFpEF, mild pulmonary hypertension, hyperlipidemia, pulmonary fibrosis, COPD who presented to the ED on 09/12/2023 for worsening generalized weakness and poor intake.  Of note patient had a recent fall that resulted in right hip fracture and 08/16/2023 and recently got out of rehab about a week ago.  Heart rates were found to be in the 40s.  Cardiology was consulted for further evaluation.  # Complete heart block # History of bradycardia # Paroxysmal atrial fibrillation Patient presents with worsening generalized weakness and poor intake for the last several weeks.  Per husband heart rates have been running in mid 40s for the last several weeks.  EKG in ED revealed high-grade AV block with rate 50.  Per telemetry rate remains in 40s.  TSH within normal limits.  -Avoid all AVN blockers.  -Hold home metoprolol  and amiodarone . (Last taken on 07/08) -Monitor and replenish electrolytes for a goal K >4, Mag >2  -Continue heparin  infusion for stroke risk reduction. (Home Pradaxa  held) -  On 7/9 GDC, PA discussed with patient, patient's husband and daughter regarding slow heart rates. Family states patient has HR in mid 40s for past several weeks. Per tele patient HR remains in 40s in complete heart block. Discussed holding home  metoprolol  and amiodarone  and allowing medication to get out of system. Also discussed possibility of PPM if HR does not improve or worsen and that would require transfer to [facility] for PPM procedure. Family and patient agree with monitoring telemetry/HR for now to see if HR improves off of AVN blockers. On 07/09, 07/10, 07/11 patient states she doesn't want PPM.  -7/12 patient decided she was ok with having pacemaker placed. Discussed with Dr. Ammon who recommends Micra. Will proceed with placement Monday at 7:30 AM. -Recommend adequate hydration and p.o. intake.  Recommend PT for deconditioning.  # Chronic HFpEF Patient without shortness of breath or LEE. Chest x-ray with no evidence of congestion or pleural effusions.  BNP elevated at 770.  Echo this admission with EF 70-75%, no wall motion abnormalities, mild LVH, severely elevated PASP. -Continue to hold home PO lasix  40 mg daily as patient does not appear volume up and with poor PO intake. -Will plan to optimize GDMT as BP and renal function allow.   # Hypertension # Hyperlipidemia # Demand ischemia Patient without chest pain. Troponins minimally elevated and flat 21 > 20 > 16 > 22.  EKG with high grade AV block, rate 50 bpm with nonspecific ST-T wave changes.  -D/c home hydrochlorothiazide  due to electrolyte abnormalities, hypokalemia. -Increase losartan  to 50 mg daily.  -Hold metoprolol  as stated above.  This patient's plan of care was discussed and created with Dr. Florencio and he is in agreement.  Signed: Danita Bloch, PA-C  09/17/2023, 8:39 AM [facility] Cardiology

## 2023-09-18 ENCOUNTER — Encounter
Admission: EM | Disposition: A | Discharge status: Skilled Nursing Facility | Payer: Self-pay | Source: Home / Self Care | Attending: Internal Medicine

## 2023-09-18 DIAGNOSIS — I442 Atrioventricular block, complete: Secondary | ICD-10-CM | POA: Diagnosis not present

## 2023-09-18 HISTORY — PX: PACEMAKER LEADLESS INSERTION: EP1219

## 2023-09-18 LAB — CBC
HCT: 33.3 % — ABNORMAL LOW (ref 36.0–46.0)
Hemoglobin: 11.1 g/dL — ABNORMAL LOW (ref 12.0–15.0)
MCH: 29.6 pg (ref 26.0–34.0)
MCHC: 33.3 g/dL (ref 30.0–36.0)
MCV: 88.8 fL (ref 80.0–100.0)
Platelets: 300 K/uL (ref 150–400)
RBC: 3.75 MIL/uL — ABNORMAL LOW (ref 3.87–5.11)
RDW: 16 % — ABNORMAL HIGH (ref 11.5–15.5)
WBC: 16 K/uL — ABNORMAL HIGH (ref 4.0–10.5)
nRBC: 0 % (ref 0.0–0.2)

## 2023-09-18 LAB — BASIC METABOLIC PANEL WITH GFR
Anion gap: 7 (ref 5–15)
BUN: 21 mg/dL (ref 8–23)
CO2: 22 mmol/L (ref 22–32)
Calcium: 8.6 mg/dL — ABNORMAL LOW (ref 8.9–10.3)
Chloride: 111 mmol/L (ref 98–111)
Creatinine, Ser: 0.78 mg/dL (ref 0.44–1.00)
GFR, Estimated: >60 mL/min (ref >60)
Glucose, Bld: 112 mg/dL — ABNORMAL HIGH (ref 70–99)
Potassium: 4.7 mmol/L (ref 3.5–5.1)
Sodium: 140 mmol/L (ref 135–145)

## 2023-09-18 SURGERY — PACEMAKER LEADLESS INSERTION
Anesthesia: Moderate Sedation

## 2023-09-18 MED ORDER — SODIUM CHLORIDE 0.9 % IV SOLN: 250.0000 mL | INTRAVENOUS | Status: AC | PRN

## 2023-09-18 MED ORDER — METOPROLOL TARTRATE 25 MG PO TABS
37.5000 mg | ORAL_TABLET | Freq: Two times a day (BID) | ORAL | Status: DC
  Administered 2023-09-18 (×2): 37.5 mg via ORAL
  Filled 2023-09-18: qty 2
  Filled 2023-09-18: qty 1.5
  Filled 2023-09-18: qty 2

## 2023-09-18 MED ORDER — MIDAZOLAM HCL 2 MG/2ML IJ SOLN
INTRAMUSCULAR | Status: DC | PRN
  Administered 2023-09-18: .5 mg via INTRAVENOUS

## 2023-09-18 MED ORDER — SODIUM CHLORIDE 0.9% FLUSH
3.0000 mL | Freq: Two times a day (BID) | INTRAVENOUS | Status: DC
  Administered 2023-09-18 – 2023-09-21 (×7): 3 mL via INTRAVENOUS

## 2023-09-18 MED ORDER — SODIUM CHLORIDE 0.9% FLUSH: 3.0000 mL | INTRAVENOUS | Status: DC | PRN

## 2023-09-18 MED ORDER — HYDRALAZINE HCL 20 MG/ML IJ SOLN
INTRAMUSCULAR | Status: AC
  Filled 2023-09-18: qty 1

## 2023-09-18 MED ORDER — METOPROLOL TARTRATE 5 MG/5ML IV SOLN
INTRAVENOUS | Status: AC
  Filled 2023-09-18: qty 5

## 2023-09-18 MED ORDER — CHLORHEXIDINE GLUCONATE CLOTH 2 % EX PADS
6.0000 | MEDICATED_PAD | Freq: Every day | CUTANEOUS | Status: AC
  Administered 2023-09-18: 6 via TOPICAL

## 2023-09-18 MED ORDER — IOHEXOL 300 MG/ML  SOLN
INTRAMUSCULAR | Status: DC | PRN
  Administered 2023-09-18: 20 mL

## 2023-09-18 MED ORDER — METOPROLOL TARTRATE 5 MG/5ML IV SOLN
INTRAVENOUS | Status: AC
Start: 2023-09-18 — End: 2023-09-18
  Filled 2023-09-18: qty 5

## 2023-09-18 MED ORDER — HEPARIN SODIUM (PORCINE) 1000 UNIT/ML IJ SOLN
INTRAMUSCULAR | Status: DC | PRN
  Administered 2023-09-18: 3000 [IU] via INTRAVENOUS

## 2023-09-18 MED ORDER — HEPARIN SODIUM (PORCINE) 1000 UNIT/ML IJ SOLN
INTRAMUSCULAR | Status: AC
  Filled 2023-09-18: qty 10

## 2023-09-18 MED ORDER — LIDOCAINE HCL (PF) 1 % IJ SOLN
INTRAMUSCULAR | Status: DC | PRN
  Administered 2023-09-18: 15 mL

## 2023-09-18 MED ORDER — FENTANYL CITRATE (PF) 100 MCG/2ML IJ SOLN
INTRAMUSCULAR | Status: DC | PRN
  Administered 2023-09-18: 12.5 ug via INTRAVENOUS

## 2023-09-18 MED ORDER — ONDANSETRON HCL 4 MG/2ML IJ SOLN: 4.0000 mg | Freq: Four times a day (QID) | INTRAMUSCULAR | Status: DC | PRN

## 2023-09-18 MED ORDER — MIDAZOLAM HCL 2 MG/2ML IJ SOLN
INTRAMUSCULAR | Status: AC
  Filled 2023-09-18: qty 2

## 2023-09-18 MED ORDER — METOPROLOL TARTRATE 5 MG/5ML IV SOLN
INTRAVENOUS | Status: DC | PRN
  Administered 2023-09-18 (×2): 5 mg via INTRAVENOUS

## 2023-09-18 MED ORDER — FENTANYL CITRATE (PF) 100 MCG/2ML IJ SOLN
INTRAMUSCULAR | Status: AC
  Filled 2023-09-18: qty 2

## 2023-09-18 SURGICAL SUPPLY — 13 items
DILATOR VESSEL 38 20CM 12FR (INTRODUCER) IMPLANT
DILATOR VESSEL 38 20CM 14FR (INTRODUCER) IMPLANT
DILATOR VESSEL 38 20CM 18FR (INTRODUCER) IMPLANT
DILATOR VESSEL 38 20CM 8FR (INTRODUCER) IMPLANT
KIT SYRINGE INJ CVI SPIKEX1 (MISCELLANEOUS) IMPLANT
NDL PERC 18GX7CM (NEEDLE) IMPLANT
NEEDLE PERC 18GX7CM (NEEDLE) ×1 IMPLANT
PACEMAKER LEADLESS AV2 MICRA (Pacemaker) IMPLANT
PACK CARDIAC CATH (CUSTOM PROCEDURE TRAY) ×1 IMPLANT
PAD ELECT DEFIB RADIOL ZOLL (MISCELLANEOUS) IMPLANT
SHEATH AVANTI 7FRX11 (SHEATH) IMPLANT
SHEATH INTRODUCER MICRA (SHEATH) IMPLANT
WIRE AMPLATZ SS-J .035X180CM (WIRE) IMPLANT

## 2023-09-18 NOTE — Plan of Care (Signed)
 Problem: Education: Goal: Knowledge of General Education information will improve Description: Including pain rating scale, medication(s)/side effects and non-pharmacologic comfort measures Outcome: Progressing   Problem: Health Behavior/Discharge Planning: Goal: Ability to manage health-related needs will improve Outcome: Progressing   Problem: Clinical Measurements: Goal: Ability to maintain clinical measurements within normal limits will improve Outcome: Progressing Goal: Will remain free from infection Outcome: Progressing Goal: Diagnostic test results will improve Outcome: Progressing Goal: Respiratory complications will improve Outcome: Progressing Goal: Cardiovascular complication will be avoided Outcome: Progressing   Problem: Activity: Goal: Risk for activity intolerance will decrease Outcome: Progressing   Problem: Nutrition: Goal: Adequate nutrition will be maintained Outcome: Progressing   Problem: Coping: Goal: Level of anxiety will decrease Outcome: Progressing   Problem: Elimination: Goal: Will not experience complications related to bowel motility Outcome: Progressing Goal: Will not experience complications related to urinary retention Outcome: Progressing   Problem: Pain Managment: Goal: General experience of comfort will improve and/or be controlled Outcome: Progressing   Problem: Safety: Goal: Ability to remain free from injury will improve Outcome: Progressing   Problem: Skin Integrity: Goal: Risk for impaired skin integrity will decrease Outcome: Progressing   Problem: Education: Goal: Knowledge of cardiac device and self-care will improve Outcome: Progressing Goal: Ability to safely manage health related needs after discharge will improve Outcome: Progressing Goal: Individualized Educational Video(s) Outcome: Progressing   Problem: Cardiac: Goal: Ability to achieve and maintain adequate cardiopulmonary perfusion will  improve Outcome: Progressing   Problem: Education: Goal: Knowledge of General Education information will improve Description: Including pain rating scale, medication(s)/side effects and non-pharmacologic comfort measures Outcome: Progressing   Problem: Health Behavior/Discharge Planning: Goal: Ability to manage health-related needs will improve Outcome: Progressing   Problem: Clinical Measurements: Goal: Ability to maintain clinical measurements within normal limits will improve Outcome: Progressing Goal: Will remain free from infection Outcome: Progressing Goal: Diagnostic test results will improve Outcome: Progressing Goal: Respiratory complications will improve Outcome: Progressing Goal: Cardiovascular complication will be avoided Outcome: Progressing   Problem: Activity: Goal: Risk for activity intolerance will decrease Outcome: Progressing   Problem: Nutrition: Goal: Adequate nutrition will be maintained Outcome: Progressing   Problem: Coping: Goal: Level of anxiety will decrease Outcome: Progressing   Problem: Elimination: Goal: Will not experience complications related to bowel motility Outcome: Progressing Goal: Will not experience complications related to urinary retention Outcome: Progressing   Problem: Pain Managment: Goal: General experience of comfort will improve and/or be controlled Outcome: Progressing   Problem: Safety: Goal: Ability to remain free from injury will improve Outcome: Progressing   Problem: Skin Integrity: Goal: Risk for impaired skin integrity will decrease Outcome: Progressing   Problem: Education: Goal: Knowledge of cardiac device and self-care will improve Outcome: Progressing Goal: Ability to safely manage health related needs after discharge will improve Outcome: Progressing Goal: Individualized Educational Video(s) Outcome: Progressing   Problem: Cardiac: Goal: Ability to achieve and maintain adequate cardiopulmonary  perfusion will improve Outcome: Progressing   Problem: Education: Goal: Knowledge of General Education information will improve Description: Including pain rating scale, medication(s)/side effects and non-pharmacologic comfort measures Outcome: Progressing   Problem: Health Behavior/Discharge Planning: Goal: Ability to manage health-related needs will improve Outcome: Progressing   Problem: Clinical Measurements: Goal: Ability to maintain clinical measurements within normal limits will improve Outcome: Progressing Goal: Will remain free from infection Outcome: Progressing Goal: Diagnostic test results will improve Outcome: Progressing Goal: Respiratory complications will improve Outcome: Progressing Goal: Cardiovascular complication will be avoided Outcome: Progressing   Problem: Activity: Goal: Risk  for activity intolerance will decrease Outcome: Progressing   Problem: Nutrition: Goal: Adequate nutrition will be maintained Outcome: Progressing   Problem: Coping: Goal: Level of anxiety will decrease Outcome: Progressing   Problem: Elimination: Goal: Will not experience complications related to bowel motility Outcome: Progressing Goal: Will not experience complications related to urinary retention Outcome: Progressing   Problem: Pain Managment: Goal: General experience of comfort will improve and/or be controlled Outcome: Progressing   Problem: Safety: Goal: Ability to remain free from injury will improve Outcome: Progressing   Problem: Skin Integrity: Goal: Risk for impaired skin integrity will decrease Outcome: Progressing   Problem: Education: Goal: Knowledge of cardiac device and self-care will improve Outcome: Progressing Goal: Ability to safely manage health related needs after discharge will improve Outcome: Progressing Goal: Individualized Educational Video(s) Outcome: Progressing   Problem: Cardiac: Goal: Ability to achieve and maintain adequate  cardiopulmonary perfusion will improve Outcome: Progressing

## 2023-09-18 NOTE — Progress Notes (Signed)
 Occupational Therapy Treatment Patient Details Name: [name] MRN: [id number] DOB: October 01, 1933 Today's Date: 09/18/2023   History of present illness Pt is an 89 y.o. female  with past medical history of HTN, HLD, HFpEF, A-fib on Pradaxa , COPD, bronchiectasis, GERD, breast cancer s/p of a left lumpectomy), Psoriatec arthritis, mild pulmonary hypertension, recent right hip fracture  admitted on 09/12/2023 with weakness and poor PO intake, workup for complete heart block and symptomatic bradycardia, afib, COPD. now s/p pacemaker 09/18/2023.   OT comments  [name] seen for OT treatment on this date. Upon arrival to room pt in bed, agreeable to tx. Pt requires MIN A for bed mobility and STS; initially MIN A to t/f to [facility], however, required MIN A +2 physical assistance for positioning on surface due to sitting too early. Pt ambulated ~ 8 ft w/ RW before fatigue and leg buckling, requiring MIN A to return to chair; HR 100 w/ activity. Provided education on IS use and d/c recs. Pt making progress toward goals, will continue to follow POC. Discharge recommendation remains appropriate.        If plan is discharge home, recommend the following:  Assist for transportation;Help with stairs or ramp for entrance;Two people to help with walking and/or transfers;Two people to help with bathing/dressing/bathroom   Equipment Recommendations  Other (comment)    Recommendations for Other Services      Precautions / Restrictions Precautions Precautions: Fall Recall of Precautions/Restrictions: Intact Restrictions Weight Bearing Restrictions Per Provider Order: Yes RLE Weight Bearing Per Provider Order: Weight bearing as tolerated       Mobility Bed Mobility Overal bed mobility: Needs Assistance Bed Mobility: Supine to Sit     Supine to sit: Min assist          Transfers Overall transfer level: Needs assistance Equipment used: Rolling walker (2 wheels) Transfers: Sit to/from Stand, Bed to  chair/wheelchair/BSC Sit to Stand: Min assist, +2 physical assistance     Step pivot transfers: Min assist, +2 physical assistance     General transfer comment: minA to avoid sitting on edge of commode (sitting too early)     Balance Overall balance assessment: Needs assistance Sitting-balance support: Feet supported, No upper extremity supported Sitting balance-Leahy Scale: Fair     Standing balance support: Bilateral upper extremity supported, Reliant on assistive device for balance Standing balance-Leahy Scale: Poor                             ADL either performed or assessed with clinical judgement   ADL Overall ADL's : Needs assistance/impaired                                       General ADL Comments: MIN A for BSC t/f; SETUP for self-feeding    Extremity/Trunk Assessment Upper Extremity Assessment Upper Extremity Assessment: Generalized weakness   Lower Extremity Assessment Lower Extremity Assessment: Generalized weakness        Vision       Perception     Praxis     Communication Communication Communication: No apparent difficulties   Cognition Arousal: Alert Behavior During Therapy: WFL for tasks assessed/performed Cognition: No apparent impairments                               Following commands:  Intact        Cueing   Cueing Techniques: Verbal cues, Gestural cues  Exercises      Shoulder Instructions       General Comments HR 100 w/ activity    Pertinent Vitals/ Pain       Pain Assessment Pain Assessment: Faces Faces Pain Scale: Hurts little more Pain Location: RLE with bed mobility Pain Descriptors / Indicators: Grimacing, Guarding Pain Intervention(s): Limited activity within patient's tolerance, Monitored during session, Repositioned  Home Living                                          Prior Functioning/Environment              Frequency  Min 2X/week         Progress Toward Goals  OT Goals(current goals can now be found in the care plan section)  Progress towards OT goals: Progressing toward goals  Acute Rehab OT Goals Patient Stated Goal: to feel stronger OT Goal Formulation: With patient/family Time For Goal Achievement: 10/02/23 Potential to Achieve Goals: Good ADL Goals Pt Will Perform Grooming: sitting;standing;with set-up Pt Will Perform Lower Body Dressing: sitting/lateral leans;sit to/from stand;with supervision Pt Will Transfer to Toilet: with contact guard assist;stand pivot transfer;bedside commode  Plan      Co-evaluation    PT/OT/SLP Co-Evaluation/Treatment: Yes Reason for Co-Treatment: To address functional/ADL transfers;For patient/therapist safety PT goals addressed during session: Mobility/safety with mobility OT goals addressed during session: ADL's and self-care      AM-PAC OT 6 Clicks Daily Activity     Outcome Measure   Help from another person eating meals?: None Help from another person taking care of personal grooming?: None Help from another person toileting, which includes using toliet, bedpan, or urinal?: A Lot Help from another person bathing (including washing, rinsing, drying)?: A Lot Help from another person to put on and taking off regular upper body clothing?: A Little Help from another person to put on and taking off regular lower body clothing?: A Lot 6 Click Score: 17    End of Session Equipment Utilized During Treatment: Rolling walker (2 wheels)  OT Visit Diagnosis: Unsteadiness on feet (R26.81);Other abnormalities of gait and mobility (R26.89);Muscle weakness (generalized) (M62.81)   Activity Tolerance Patient tolerated treatment well;Patient limited by fatigue   Patient Left in chair;with call bell/phone within reach;with chair alarm set;with family/visitor present   Nurse Communication          Time: 8584-8565 OT Time Calculation (min): 19 min  Charges: OT  General Charges $OT Visit: 1 Visit OT Treatments $Self Care/Home Management : 8-22 mins  [name], Student OT   [employer] 09/18/2023, 4:09 PM

## 2023-09-18 NOTE — Progress Notes (Signed)
 Physical Therapy Treatment Patient Details Name: [name] MRN: [id number] DOB: Apr 25, 1933 Today's Date: 09/18/2023   History of Present Illness Pt is an 89 y.o. female  with past medical history of HTN, HLD, HFpEF, A-fib on Pradaxa , COPD, bronchiectasis, GERD, breast cancer s/p of a left lumpectomy), Psoriatec arthritis, mild pulmonary hypertension, recent right hip fracture  admitted on 09/12/2023 with weakness and poor PO intake, workup for complete heart block and symptomatic bradycardia, afib, COPD. no s/p pacemaker 09/18/2023.    PT Comments  Pt alert and oriented, reported fatigue. Able to address family at bedside. PT/OT co-treat to address potential mobility deficits and decreased activity tolerance/endurance. Able to transfer to EOB with minA, extra time and reliance on bed rails. Sit <> stand from EOB and from commode, minA as well with RW despite extra time to allow pt to maximize independence. She ambulated ~34ft in room but reported feeling like her R leg was going to give out, distance limited for safety. Pt in chair with needs in reach. The patient would benefit from further skilled PT intervention to continue to progress towards goals.    If plan is discharge home, recommend the following: Direct supervision/assist for medications management;Help with stairs or ramp for entrance;Assistance with cooking/housework;Assist for transportation;A lot of help with walking and/or transfers;A lot of help with bathing/dressing/bathroom   Can travel by private vehicle     No  Equipment Recommendations  Other (comment) (TBD)    Recommendations for Other Services       Precautions / Restrictions Precautions Precautions: Fall Recall of Precautions/Restrictions: Intact Restrictions Weight Bearing Restrictions Per Provider Order: Yes RLE Weight Bearing Per Provider Order: Weight bearing as tolerated RLE Partial Weight Bearing Percentage or Pounds: updated MD note says WBAT      Mobility  Bed Mobility Overal bed mobility: Needs Assistance Bed Mobility: Supine to Sit     Supine to sit: Min assist          Transfers Overall transfer level: Needs assistance Equipment used: Rolling walker (2 wheels) Transfers: Sit to/from Stand, Bed to chair/wheelchair/BSC Sit to Stand: Min assist, +2 physical assistance   Step pivot transfers: Min assist, +2 physical assistance       General transfer comment: minA to avoid sitting on edge of commode (sitting too early)    Ambulation/Gait Ambulation/Gait assistance: Contact guard assist Gait Distance (Feet): 10 Feet Assistive device: Rolling walker (2 wheels)   Gait velocity: decreased     General Gait Details: reported feeling like her R leg was going to give out, distance limited   Stairs             Wheelchair Mobility     Tilt Bed    Modified Rankin (Stroke Patients Only)       Balance Overall balance assessment: Needs assistance Sitting-balance support: Feet supported, No upper extremity supported Sitting balance-Leahy Scale: Fair Sitting balance - Comments: pericare in sitting   Standing balance support: Bilateral upper extremity supported, Reliant on assistive device for balance, During functional activity Standing balance-Leahy Scale: Poor                              Communication Communication Communication: No apparent difficulties  Cognition Arousal: Alert Behavior During Therapy: WFL for tasks assessed/performed                             Following commands:  Intact      Cueing Cueing Techniques: Verbal cues, Gestural cues  Exercises      General Comments        Pertinent Vitals/Pain Pain Assessment Pain Assessment: Faces Faces Pain Scale: Hurts little more Pain Location: RLE with bed mobility Pain Descriptors / Indicators: Grimacing, Guarding Pain Intervention(s): Limited activity within patient's tolerance, Monitored during  session, Repositioned    Home Living                          Prior Function            PT Goals (current goals can now be found in the care plan section) Progress towards PT goals: Progressing toward goals    Frequency    Min 2X/week      PT Plan      Co-evaluation PT/OT/SLP Co-Evaluation/Treatment: Yes Reason for Co-Treatment: To address functional/ADL transfers;For patient/therapist safety PT goals addressed during session: Mobility/safety with mobility OT goals addressed during session: ADL's and self-care      AM-PAC PT 6 Clicks Mobility   Outcome Measure  Help needed turning from your back to your side while in a flat bed without using bedrails?: A Little Help needed moving from lying on your back to sitting on the side of a flat bed without using bedrails?: A Lot Help needed moving to and from a bed to a chair (including a wheelchair)?: A Lot Help needed standing up from a chair using your arms (e.g., wheelchair or bedside chair)?: A Little Help needed to walk in hospital room?: A Little Help needed climbing 3-5 steps with a railing? : Total 6 Click Score: 14    End of Session   Activity Tolerance: Patient limited by fatigue Patient left: in chair;with call bell/phone within reach;with chair alarm set Nurse Communication: Mobility status PT Visit Diagnosis: Other abnormalities of gait and mobility (R26.89);Difficulty in walking, not elsewhere classified (R26.2);Muscle weakness (generalized) (M62.81)     Time: 8582-8568 PT Time Calculation (min) (ACUTE ONLY): 14 min  Charges:      PT General Charges $$ ACUTE PT VISIT: 1 Visit                     Doyal Shams PT, DPT 2:42 PM,09/18/23

## 2023-09-18 NOTE — Progress Notes (Signed)
 PROGRESS NOTE    [name]  FMW:[id number] DOB: March 04, 1934 DOA: 09/12/2023 PCP: Cleotilde Oneil FALCON, MD    Brief Narrative:  89 y.o. female with medical history significant of HTN, HLD, HFpEF, A-fib on Pradaxa , COPD, bronchiectasis, GERD, breast cancer s/p of a left lumpectomy), Psoriatec arthritis, mild pulmonary hypertension, recent right hip fracture, who presents with weakness.   Patient was recently hospitalized from 6/11 - 6/16 due to mechanical fall and right hip fracture.  Patient was s/p of right hip surgery.  Patient states that her surgical site has been healing well.  In the past several days she has been feeling generalized weak, with poor appetite and decreased oral intake, cannot ambulate without assistance due to weakness.  No fall.  She has nausea, no vomiting, diarrhea or abdominal pain.  No chest pain, cough, SOB.  Denies symptoms of UTI.  No rectal bleeding or dark stool.  Per her husband, patient's heart rate was at 40s when he checked it for her at home.   Pt was found to have new complete heart block on EKG with heart rate in 40s in ED. Pt is taking amiodarone  200 mg daily and metoprolol  37.5 mg twice daily for A-fib currently.  Status post pacemaker implantation 7/14.  Tolerated well.  He is in paced rhythm after procedure    Assessment & Plan:   Principal Problem:   Complete heart block (HCC) Active Problems:   Atrial fibrillation, chronic (HCC)   Myocardial injury   (HFpEF) heart failure with preserved ejection fraction (HCC)   Primary hypertension   Leukocytosis   Bronchiectasis (HCC)   Chronic obstructive pulmonary disease (HCC)   Psoriasis vulgaris   Psoriatic arthritis (HCC)   Symptomatic bradycardia   Malnutrition of moderate degree   Anorexia   Nausea and vomiting  High degree heart block Symptomatic bradycardia Currently hemodynamically stable.  No chest pain or shortness of breath.  Heart rate 40.  EDP consulted with cardiology who recommend  holding metoprolol  and amiodarone .  Remains in heart block.  Was previously refusing PPM but now agreeable upon repeat conversation. Now status post pacemaker on 7/14 Plan: Continue telemetry for now.  Hold amiodarone .  Metoprolol  resumed.  Cardiology continues to follow.  Anticipate discharge 7/15.  Anticipate restarting Pradaxa  7/15.  Chronic A-fib: Pradaxa  on hold.  Metoprolol  resumed.  In paced rhythm.  Continue telemetry.  Anticipate restarting Pradaxa  7/15.   Myocardial injury:  Troponin peaked at 21.  Suspect demand ischemia.  (HFpEF) heart failure with preserved ejection fraction (HCC):  2D echo on 05/19/2023 showed EF 60 to 65% with grade 2 diastolic dysfunction.  Patient does not have leg edema or JVD.  No SOB.  CHF seems to be compensated. - Diuretic on hold.     Primary hypertension HCTZ on hold.  Continue losartan .  Continue metoprolol .   Leukocytosis:  Appears to be WBC 23.2, no source infection identified so far.   Appears to be chronic issue .  No indication of infection.  Continue to hold antibiotics.   Bronchiectasis (HCC) and Chronic obstructive pulmonary disease (HCC):  stable.  No wheezing, no SOB. - Continue bronchodilators and as needed Mucinex    Hx of psoriasis vulgaris and psoriatic arthritis (HCC): - Patient is on weekly methotrexate  (every Sunday).  Defer to outpatient  Dry heaving Poor p.o. intake Intractable nausea Appears improved.  Appetite waxes and wanes.  No plans for endoscopy.  On p.o. PPI and p.o. Reglan .  Will continue.   DVT prophylaxis: IV  heparin  Code Status: Full Family Communication: Husband at bedside 7/9, 7/10, 7/11, 7/12, 7/13.  Daughter at bedside 7/10, 7/11, 7/14 Disposition Plan: Status is: Inpatient Remains inpatient appropriate because: Multiple acute issues as above   Level of care: Telemetry Cardiac  Consultants:  Cardiology  Procedures:  None  Antimicrobials: None   Subjective: Seen and examined.  No  complaints.  Energy level slightly improved today.  Objective: Vitals:   09/18/23 1030 09/18/23 1137 09/18/23 1400 09/18/23 1521  BP: (!) 151/88 (!) 150/90 124/67 122/62  Pulse:  98 80 98  Resp: 20     Temp: 97.8 F (36.6 C) 97.9 F (36.6 C)  98.2 F (36.8 C)  TempSrc: Oral     SpO2: 95% 93% 97% 95%  Weight:      Height:        Intake/Output Summary (Last 24 hours) at 09/18/2023 1731 Last data filed at 09/18/2023 0029 Gross per 24 hour  Intake 160 ml  Output 350 ml  Net -190 ml   Filed Weights   09/17/23 0405 09/18/23 0329 09/18/23 0502  Weight: 52.4 kg 51.3 kg 51.6 kg    Examination:  General exam: No acute distress.  Appears fatigued Respiratory system: Poor respiratory effort.  Normal work of breathing.  Room air Cardiovascular system: S1-S2, RRR no murmurs, no pedal edema Gastrointestinal system: Soft, NT/ND, normal bowel sounds Central nervous system: Alert and oriented. No focal neurological deficits. Extremities: Decreased power bilateral lower extremities Skin: No rashes, lesions or ulcers Psychiatry: Judgement and insight appear impaired. Mood & affect flattened.     Data Reviewed: I have personally reviewed following labs and imaging studies  CBC: Recent Labs  Lab 09/12/23 1745 09/13/23 0353 09/14/23 0343 09/15/23 0640 09/16/23 0428 09/17/23 0714 09/18/23 0705  WBC 23.2*   < > 17.4* 14.5* 13.8* 15.3* 16.0*  NEUTROABS 19.9*  --   --   --   --   --   --   HGB 14.1   < > 11.6* 11.5* 11.6* 11.4* 11.1*  HCT 41.7   < > 35.2* 33.9* 35.9* 35.0* 33.3*  MCV 87.8   < > 88.9 88.3 90.0 89.1 88.8  PLT 273   < > 229 231 251 295 300   < > = values in this interval not displayed.   Basic Metabolic Panel: Recent Labs  Lab 09/12/23 1745 09/13/23 0353 09/14/23 0343 09/15/23 0640 09/17/23 0714 09/18/23 0705  NA 135 136 137 137 136 140  K 3.5 2.8* 3.7 4.8 4.6 4.7  CL 98 99 102 105 104 111  CO2 24 23 23 23 23 22   GLUCOSE 120* 92 124* 110* 103* 112*  BUN  22 19 30* 24* 18 21  CREATININE 0.64 0.51 0.47 0.57 0.61 0.78  CALCIUM  8.9 7.9* 8.3* 8.5* 8.7* 8.6*  MG 1.9  --  1.8 2.4  --   --    GFR: Estimated Creatinine Clearance: 39.2 mL/min (by C-G formula based on SCr of 0.78 mg/dL). Liver Function Tests: Recent Labs  Lab 09/12/23 1745  AST 20  ALT 12  ALKPHOS 87  BILITOT 1.0  PROT 7.2  ALBUMIN 3.3*   No results for input(s): LIPASE, AMYLASE in the last 168 hours. No results for input(s): AMMONIA in the last 168 hours. Coagulation Profile: Recent Labs  Lab 09/12/23 2322  INR 1.3*   Cardiac Enzymes: No results for input(s): CKTOTAL, CKMB, CKMBINDEX, TROPONINI in the last 168 hours. BNP (last 3 results) No results for input(s): PROBNP in  the last 8760 hours. HbA1C: No results for input(s): HGBA1C in the last 72 hours. CBG: No results for input(s): GLUCAP in the last 168 hours. Lipid Profile: No results for input(s): CHOL, HDL, LDLCALC, TRIG, CHOLHDL, LDLDIRECT in the last 72 hours. Thyroid  Function Tests: No results for input(s): TSH, T4TOTAL, FREET4, T3FREE, THYROIDAB in the last 72 hours.  Anemia Panel: No results for input(s): VITAMINB12, FOLATE, FERRITIN, TIBC, IRON , RETICCTPCT in the last 72 hours. Sepsis Labs: No results for input(s): PROCALCITON, LATICACIDVEN in the last 168 hours.  Recent Results (from the past 240 hours)  Surgical PCR screen     Status: None   Collection Time: 09/17/23  8:00 PM   Specimen: Nasal Mucosa; Nasal Swab  Result Value Ref Range Status   MRSA, PCR NEGATIVE NEGATIVE Final   Staphylococcus aureus NEGATIVE NEGATIVE Final    Comment: (NOTE) The Xpert SA Assay (FDA approved for NASAL specimens in patients 70 years of age and older), is one component of a comprehensive surveillance program. It is not intended to diagnose infection nor to guide or monitor treatment. Performed at [facility], [address]  [place], [place] [address]          Radiology Studies: EP PPM/ICD IMPLANT Result Date: 09/18/2023 Successful Med[employer]onic Micra AV 2 implantation        Scheduled Meds:  budesonide -glycopyrrolate -formoterol   2 puff Inhalation BID   dexamethasone  (DECADRON ) injection  4 mg Intravenous Q24H   feeding supplement  237 mL Oral BID BM   folic acid   1 mg Oral Daily   LORazepam   0.5 mg Oral QHS   losartan   50 mg Oral Daily   metoCLOPramide   10 mg Oral TID AC   metoprolol  tartrate  37.5 mg Oral BID   mirtazapine   15 mg Oral QHS   multivitamin with minerals  1 tablet Oral Daily   pantoprazole   40 mg Oral Daily   sodium chloride  flush  3 mL Intravenous Q12H   Continuous Infusions:  sodium chloride        LOS: 6 days      Calvin KATHEE Robson, MD [facility]   If 7PM-7AM, please contact night-coverage  09/18/2023, 5:31 PM

## 2023-09-18 NOTE — Discharge Instructions (Addendum)
 Please do not shower until tomorrow and do not submerge in water  (no baths, swimming) for at least 1 week or until you follow up with Dr. Bob Burn. You can remove the clear bandage after you shower. Take all your medicines as prescribed. Please call Dr. Bob Burn 's office at [facility] [phone]) or if you have any questions or concerns.

## 2023-09-18 NOTE — Progress Notes (Signed)
 [facility] CLINIC CARDIOLOGY PROGRESS NOTE       Patient ID: [name] MRN: [id number] DOB/AGE: [birth year] 89 y.o.  Admit date: 09/12/2023 Referring Physician Dr. Hilma Primary Physician Cleotilde Oneil FALCON, MD Primary Cardiologist Tinnie Maiden, NP; Dr. Dewane Reason for Consultation complete heart block  HPI: [name] is a 89 y.o. female  with a past medical history of history bradycardia, hypokalemia, paroxysmal atrial fibrillation (on Pradaxa ), chronic HFpEF, mild pulmonary hypertension, hypertension, hyperlipidemia, pulmonary fibrosis, COPD who presented to the ED on 09/12/2023 for worsening generalized weakness and poor intake. Of note patient had a recent fall that resulted in right hip fracture and 08/16/2023 and recently got out of rehab about a week ago.  Heart rates were found to be in the 40s.  Cardiology was consulted for further evaluation.   Interval History: -Patient seen and examined this afternoon, sitting upright in hospital bed eating lunch.  Patient states she feels better and denies chest pain, lightheadedness or dizziness.  - BP remains elevated, HR 90s, V-paced per tele.  -Patient underwent leadless PPM (medtronic) on 07/14 with Dr. Ammon. Patient tolerated procedure well.    Pertinent Cardiac History (Most recent) 3 day Holter monitor:08/01/2023 to 08/04/2023 Predominant rhythm is sinus rhythm. Maximum heart rate 141 bpm, minimum heart rate 57 bpm the average heart rate of 74 bpm Occasional less than 1% PACs and rare PVCs noted 8 occurrences of short lasting supraventricular tachycardia, longest episode 17 beats 0 patient triggers  Impression: Predominant rhythm is sinus rhythm. No significant arrhythmias.   Review of systems complete and found to be negative unless listed above    Past Medical History:  Diagnosis Date   Acquired thrombophilia (HCC)    Acute metabolic encephalopathy 06/19/2020   Allergy    Atherosclerosis of aorta (HCC)    Atrial  fibrillation (HCC)    Bronchiectasis (HCC)    Cancer (HCC)    left breast cancer    Chronic nausea    COPD (chronic obstructive pulmonary disease) (HCC)    GERD (gastroesophageal reflux disease)    Grade II diastolic dysfunction    Hyperlipemia    Hypertension    Intractable nausea and vomiting 06/20/2018   Mild concentric left ventricular hypertrophy (LVH)    Mild mitral stenosis by prior echocardiogram    Mild pulmonary hypertension (HCC)    Psoriatic arthritis (HCC)    Stage 2 moderate COPD by GOLD classification [facility])     Past Surgical History:  Procedure Laterality Date   BREAST LUMPECTOMY Left 2008   CATARACT EXTRACTION W/PHACO Left 02/13/2023   Procedure: CATARACT EXTRACTION PHACO AND INTRAOCULAR LENS PLACEMENT (IOC) LEFT  KDB GONIOTOMY;  Surgeon: Enola Feliciano Hugger, MD;  Location: [facility];  Service: Ophthalmology;  Laterality: Left;  10.53 0:55.   CATARACT EXTRACTION W/PHACO Right 02/27/2023   Procedure: CATARACT EXTRACTION PHACO AND INTRAOCULAR LENS PLACEMENT (IOC) RIGHT  [place], goniotomy 15.90 01:28.6;  Surgeon: Enola Feliciano Hugger, MD;  Location: [facility];  Service: Ophthalmology;  Laterality: Right;   CHOLECYSTECTOMY     HIP PINNING,CANNULATED Left 06/05/2020   Procedure: CANNULATED HIP PINNING;  Surgeon: Kathlynn Sharper, MD;  Location: [facility] OR[facility];  Service: Orthopedics;  Laterality: Left;   HIP PINNING,CANNULATED Right 08/16/2023   Procedure: FIXATION, FEMUR, NECK, PERCUTANEOUS, USING SCREW;  Surgeon: Edie Norleen PARAS, MD;  Location: [facility] OR[facility];  Service: Orthopedics;  Laterality: Right;   PARTIAL HYSTERECTOMY     TEE WITHOUT CARDIOVERSION N/A 06/10/2020   Procedure: TRANSESOPHAGEAL ECHOCARDIOGRAM (TEE);  Surgeon: Darliss Rogue, MD;  Location: [facility] OR[facility];  Service: Cardiovascular;  Laterality: N/A;    Medications Prior to Admission  Medication Sig Dispense Refill Last Dose/Taking   amiodarone  (PACERONE ) 200 MG tablet Take 1 tablet (200 mg total) by  mouth daily. 30 tablet 0 09/12/2023 Morning   dabigatran  (PRADAXA ) 150 MG CAPS capsule Take 1 capsule (150 mg total) by mouth every 12 (twelve) hours. 60 capsule 0 09/12/2023 at  8:00 AM   docusate sodium  (COLACE) 100 MG capsule Take 1 capsule (100 mg total) by mouth 2 (two) times daily. 10 capsule 0 Past Month   Fluticasone -Umeclidin-Vilant (TRELEGY ELLIPTA) 100-62.5-25 MCG/ACT AEPB Inhale 1 puff into the lungs daily.   09/12/2023 Morning   folic acid  (FOLVITE ) 1 MG tablet Take 1 mg by mouth daily. Pt takes this med the days she does not take methotrexate    09/12/2023 Morning   hydrochlorothiazide  (HYDRODIURIL ) 25 MG tablet Take 1 tablet by mouth daily.   09/12/2023 Morning   HYDROcodone -acetaminophen  (NORCO/VICODIN) 5-325 MG tablet Take 1-2 tablets by mouth every 4 (four) hours as needed for moderate pain (pain score 4-6). 30 tablet 0 09/12/2023   LORazepam  (ATIVAN ) 0.5 MG tablet Take 1 tablet (0.5 mg total) by mouth at bedtime. 10 tablet 0 09/12/2023   methotrexate  (RHEUMATREX) 2.5 MG tablet Take 5 mg by mouth once a week. Caution:Chemotherapy. Protect from light. Pt take 2 weekly.   09/10/2023   metoCLOPramide  (REGLAN ) 10 MG tablet Take 1 tablet (10 mg total) by mouth 4 (four) times daily -  before meals and at bedtime. 120 tablet 0 09/12/2023 Morning   metoprolol  tartrate (LOPRESSOR ) 25 MG tablet Take 1.5 tablets (37.5 mg total) by mouth 2 (two) times daily. 90 tablet 0 Past Week   omeprazole  (PRILOSEC) 40 MG capsule Take 40 mg by mouth daily.   09/12/2023 Morning   ondansetron  (ZOFRAN ) 4 MG tablet Take 4 mg by mouth every 8 (eight) hours as needed for nausea or vomiting.   Unknown   [Paused] furosemide  (LASIX ) 40 MG tablet Take 1 tablet (40 mg total) by mouth daily. 30 tablet 0    Social History   Socioeconomic History   Marital status: Married    Spouse name: Not on file   Number of children: 2   Years of education: Not on file   Highest education level: Not on file  Occupational History   Not on file   Tobacco Use   Smoking status: Never   Smokeless tobacco: Never  Vaping Use   Vaping status: Never Used  Substance and Sexual Activity   Alcohol use: No   Drug use: No   Sexual activity: Not on file  Other Topics Concern   Not on file  Social History Narrative   Not on file   Social Drivers of Health   Financial Resource Strain: Low Risk  (08/10/2023)   Received from [facility]   Overall Financial Resource Strain (CARDIA)    Difficulty of Paying Living Expenses: Not hard at all  Food Insecurity: No Food Insecurity (09/14/2023)   Hunger Vital Sign    Worried About Running Out of Food in the Last Year: Never true    Ran Out of Food in the Last Year: Never true  Transportation Needs: No Transportation Needs (09/14/2023)   PRAPARE - Administrator, Civil Service (Medical): No    Lack of Transportation (Non-Medical): No  Physical Activity: Sufficiently Active (02/17/2021)   Exercise Vital Sign  Days of Exercise per Week: 7 days    Minutes of Exercise per Session: 30 min  Stress: No Stress Concern Present (02/17/2021)   [employer] of Occupational Health - Occupational Stress Questionnaire    Feeling of Stress : Not at all  Social Connections: Moderately Isolated (09/14/2023)   Social Connection and Isolation Panel    Frequency of Communication with Friends and Family: More than three times a week    Frequency of Social Gatherings with Friends and Family: Three times a week    Attends Religious Services: Never    Active Member of Clubs or Organizations: No    Attends Banker Meetings: Never    Marital Status: Married  Catering manager Violence: Not At Risk (09/14/2023)   Humiliation, Afraid, Rape, and Kick questionnaire    Fear of Current or Ex-Partner: No    Emotionally Abused: No    Physically Abused: No    Sexually Abused: No    Family History  Problem Relation Age of Onset   Breast cancer Sister      Vitals:    09/18/23 0930 09/18/23 0945 09/18/23 1030 09/18/23 1137  BP: 132/83 (!) 142/78 (!) 151/88 (!) 150/90  Pulse: 96 96  98  Resp: (!) 44 (!) 21 20   Temp:  98 F (36.7 C) 97.8 F (36.6 C) 97.9 F (36.6 C)  TempSrc:  Temporal Oral   SpO2: 90% 90% 95% 93%  Weight:      Height:        PHYSICAL EXAM General: Chronically ill-appearing elderly female, well nourished, in no acute distress. HEENT: Normocephalic and atraumatic. Neck: No JVD.   Lungs: Normal respiratory effort on room air. Clear bilaterally to auscultation. No wheezes, crackles, rhonchi.  Heart: HRRR. Normal S1 and S2 without gallops or murmurs.  Abdomen: Non-distended appearing.  Msk: Normal strength and tone for age. Extremities: Warm and well perfused. No clubbing, cyanosis, edema.  Neuro: Alert and oriented X 3. Psych: Answers questions appropriately.   Labs: Basic Metabolic Panel: Recent Labs    09/17/23 0714 09/18/23 0705  NA 136 140  K 4.6 4.7  CL 104 111  CO2 23 22  GLUCOSE 103* 112*  BUN 18 21  CREATININE 0.61 0.78  CALCIUM  8.7* 8.6*   Liver Function Tests: No results for input(s): AST, ALT, ALKPHOS, BILITOT, PROT, ALBUMIN in the last 72 hours.  No results for input(s): LIPASE, AMYLASE in the last 72 hours. CBC: Recent Labs    09/17/23 0714 09/18/23 0705  WBC 15.3* 16.0*  HGB 11.4* 11.1*  HCT 35.0* 33.3*  MCV 89.1 88.8  PLT 295 300   Cardiac Enzymes: No results for input(s): CKTOTAL, CKMB, CKMBINDEX, TROPONINIHS in the last 72 hours.  BNP: No results for input(s): BNP in the last 72 hours.  D-Dimer: No results for input(s): DDIMER in the last 72 hours. Hemoglobin A1C: No results for input(s): HGBA1C in the last 72 hours. Fasting Lipid Panel: No results for input(s): CHOL, HDL, LDLCALC, TRIG, CHOLHDL, LDLDIRECT in the last 72 hours. Thyroid  Function Tests: No results for input(s): TSH, T4TOTAL, T3FREE, THYROIDAB in the last 72  hours.  Invalid input(s): FREET3  Anemia Panel: No results for input(s): VITAMINB12, FOLATE, FERRITIN, TIBC, IRON , RETICCTPCT in the last 72 hours.   Radiology: EP PPM/ICD IMPLANT Result Date: 09/18/2023 Successful Medtronic Micra AV 2 implantation   DG HIP PORT UNILAT WITH PELVIS 1V RIGHT Result Date: 09/15/2023 CLINICAL DATA:  Right hip pain, postop 1 month EXAM: DG  HIP (WITH OR WITHOUT PELVIS) 1V PORT RIGHT COMPARISON:  Radiographs 08/16/2023 FINDINGS: Screw fixation of both femoral necks. No radiographic evidence of loosening. No acute fracture or dislocation. Degenerative changes pubic symphysis, both hips, SI joints and lower lumbar spine. IMPRESSION: Screw fixation of both femoral necks. No radiographic evidence of loosening. No acute fracture. Electronically Signed   By: Norman Gatlin M.D.   On: 09/15/2023 19:46   ECHOCARDIOGRAM COMPLETE Result Date: 09/15/2023    ECHOCARDIOGRAM REPORT   Patient Name:   [name] Greulich Date of Exam: 09/15/2023 Medical Rec #:  [id number]     Height:       62.0 in Accession #:    [id number]    Weight:       137.8 lb Date of Birth:  [birth year]     BSA:          1.632 m Patient Age:    89 years      BP:           167/48 mmHg Patient Gender: F             HR:           42 bpm. Exam Location:  [facility] Procedure: 2D Echo, Cardiac Doppler and Color Doppler (Both Spectral and Color            Flow Doppler were utilized during procedure). Indications:     Abnormal ECG R94.31  History:         Patient has prior history of Echocardiogram examinations, most                  recent 05/19/2023. Arrythmias:Atrial Fibrillation; Risk                  Factors:Hypertension.  Sonographer:     Christopher Furnace Referring Phys:  [id number] Earline Stiner Diagnosing Phys: Keller Alluri IMPRESSIONS  1. Left ventricular ejection fraction, by estimation, is 70 to 75%. The left ventricle has hyperdynamic function. The left ventricle has no regional wall motion abnormalities. There is  mild left ventricular hypertrophy. Left ventricular diastolic parameters are indeterminate.  2. Right ventricular systolic function is normal. The right ventricular size is normal. There is severely elevated pulmonary artery systolic pressure. The estimated right ventricular systolic pressure is 63.7 mmHg.  3. Trivial mitral valve regurgitation. Mild mitral stenosis. Severe mitral annular calcification.  4. The aortic valve is tricuspid. Aortic valve regurgitation is not visualized. FINDINGS  Left Ventricle: Left ventricular ejection fraction, by estimation, is 70 to 75%. The left ventricle has hyperdynamic function. The left ventricle has no regional wall motion abnormalities. The left ventricular internal cavity size was normal in size. There is mild left ventricular hypertrophy. Left ventricular diastolic parameters are indeterminate. Right Ventricle: The right ventricular size is normal. No increase in right ventricular wall thickness. Right ventricular systolic function is normal. There is severely elevated pulmonary artery systolic pressure. The tricuspid regurgitant velocity is 3.83 m/s, and with an assumed right atrial pressure of 5 mmHg, the estimated right ventricular systolic pressure is 63.7 mmHg. Left Atrium: Left atrial size was normal in size. Right Atrium: Right atrial size was normal in size. Pericardium: There is no evidence of pericardial effusion. Mitral Valve: There is mild thickening of the mitral valve leaflet(s). Severe mitral annular calcification. Trivial mitral valve regurgitation. Mild mitral valve stenosis. MV peak gradient, 16.5 mmHg. The mean mitral valve gradient is 6.0 mmHg. Tricuspid Valve: The tricuspid valve is normal in structure. Tricuspid valve regurgitation  is mild. Aortic Valve: The aortic valve is tricuspid. Aortic valve regurgitation is not visualized. Aortic valve mean gradient measures 5.0 mmHg. Aortic valve peak gradient measures 7.8 mmHg. Aortic valve area, by VTI  measures 1.81 cm. Pulmonic Valve: The pulmonic valve was not well visualized. Pulmonic valve regurgitation is trivial. Aorta: The aortic root and ascending aorta are structurally normal, with no evidence of dilitation. Venous: The inferior vena cava was not well visualized. IAS/Shunts: The atrial septum is grossly normal. Additional Comments: There is pleural effusion in the left lateral region.  LEFT VENTRICLE PLAX 2D LVIDd:         3.50 cm   Diastology LVIDs:         2.60 cm   LV e' medial:    4.68 cm/s LV PW:         1.10 cm   LV E/e' medial:  31.6 LV IVS:        1.30 cm   LV e' lateral:   6.74 cm/s LVOT diam:     2.00 cm   LV E/e' lateral: 22.0 LV SV:         54 LV SV Index:   33 LVOT Area:     3.14 cm  RIGHT VENTRICLE RV Basal diam:  2.80 cm RV Mid diam:    2.70 cm LEFT ATRIUM             Index        RIGHT ATRIUM           Index LA diam:        4.40 cm 2.70 cm/m   RA Area:     15.30 cm LA Vol (A2C):   32.7 ml 20.04 ml/m  RA Volume:   40.60 ml  24.88 ml/m LA Vol (A4C):   39.2 ml 24.02 ml/m LA Biplane Vol: 36.2 ml 22.19 ml/m  AORTIC VALVE AV Area (Vmax):    1.61 cm AV Area (Vmean):   1.50 cm AV Area (VTI):     1.81 cm AV Vmax:           140.00 cm/s AV Vmean:          102.350 cm/s AV VTI:            0.298 m AV Peak Grad:      7.8 mmHg AV Mean Grad:      5.0 mmHg LVOT Vmax:         71.90 cm/s LVOT Vmean:        48.900 cm/s LVOT VTI:          0.171 m LVOT/AV VTI ratio: 0.57  AORTA Ao Root diam: 2.50 cm MITRAL VALVE                TRICUSPID VALVE MV Area (PHT): 2.36 cm     TR Peak grad:   58.7 mmHg MV Area VTI:   0.78 cm     TR Vmax:        383.00 cm/s MV Peak grad:  16.5 mmHg MV Mean grad:  6.0 mmHg     SHUNTS MV Vmax:       2.03 m/s     Systemic VTI:  0.17 m MV Vmean:      113.0 cm/s   Systemic Diam: 2.00 cm MV Decel Time: 322 msec MV E velocity: 148.00 cm/s MV A velocity: 121.00 cm/s MV E/A ratio:  1.22 Keller Paterson Electronically signed by Keller Paterson Signature Date/Time: 09/15/2023/1:31:34 PM  Final    DG Chest Portable 1 View Result Date: 09/12/2023 CLINICAL DATA:  Weakness, shortness of breath EXAM: PORTABLE CHEST 1 VIEW COMPARISON:  07/18/2023 FINDINGS: Heart and mediastinal contours within normal limits. Aortic atherosclerosis. Right lung clear. Patchy opacity in the left lower lung again noted, improved since prior study. No visible effusions or acute bony abnormality. Biapical scarring. IMPRESSION: Persistent but improving left basilar atelectasis or infiltrate. No visible effusions. Electronically Signed   By: Franky Crease M.D.   On: 09/12/2023 19:30    ECHO as above   05/19/2023 1. Left ventricular ejection fraction, by estimation, is 60 to 65%. The left ventricle has normal function. The left ventricle has no regional wall motion abnormalities. There is mild left ventricular hypertrophy. Left ventricular diastolic parameters are consistent with Grade II diastolic dysfunction (pseudonormalization).   2. Right ventricular systolic function is normal. The right ventricular size is normal. There is mildly elevated pulmonary artery systolic pressure.   3. Left atrial size was mildly dilated.   4. The mitral valve is normal in structure. No evidence of mitral valve regurgitation. Mild mitral stenosis. The mean mitral valve gradient is 4.0 mmHg. Severe mitral annular calcification.   5. Tricuspid valve regurgitation is mild to moderate.   6. The aortic valve is normal in structure. Aortic valve regurgitation is not visualized. Aortic valve sclerosis is present, with no evidence of aortic valve stenosis.   7. The inferior vena cava is normal in size with greater than 50% respiratory variability, suggesting right atrial pressure of 3 mmHg.    TELEMETRY reviewed by me 09/18/2023: V-paced, rate 90s  EKG reviewed by me:  complete heart block, rate 50 bpm with nonspecific ST-T wave changes  Data reviewed by me 09/18/2023: last 24h vitals tele labs imaging I/O hospitalist progress  notes.  Principal Problem:   Complete heart block (HCC) Active Problems:   Psoriasis vulgaris   Primary hypertension   Bronchiectasis (HCC)   Chronic obstructive pulmonary disease (HCC)   Leukocytosis   (HFpEF) heart failure with preserved ejection fraction (HCC)   Psoriatic arthritis (HCC)   Myocardial injury   Atrial fibrillation, chronic (HCC)   Symptomatic bradycardia   Malnutrition of moderate degree   Anorexia   Nausea and vomiting    ASSESSMENT AND PLAN:  DAIYA TAMER is a 89 y.o. female  with a past medical history of history bradycardia, hypokalemia, paroxysmal atrial fibrillation (on Pradaxa ), chronic HFpEF, mild pulmonary hypertension, hyperlipidemia, pulmonary fibrosis, COPD who presented to the ED on 09/12/2023 for worsening generalized weakness and poor intake.  Of note patient had a recent fall that resulted in right hip fracture and 08/16/2023 and recently got out of rehab about a week ago.  Heart rates were found to be in the 40s.  Cardiology was consulted for further evaluation.  # Complete heart block s/p leadless PPM (Medtronic - 09/18/23) # History of bradycardia # Paroxysmal atrial fibrillation Patient presents with worsening generalized weakness and poor intake for the last several weeks.  Per husband heart rates have been running in mid 40s for the last several weeks.  EKG in ED revealed high-grade AV block with rate 50.  Per telemetry rate remains in 40s.  TSH within normal limits. Patient underwent leadless PPM (medtronic) on 07/14 with Dr. Ammon. Patient tolerated procedure well. Per tele V-paced 90s.  -Monitor and replenish electrolytes for a goal K >4, Mag >2  -Continue metoprolol  tartrate 37.5 mg BID. Uptitarte as BP allows if HR remains elevated. -  Will plan to resume home Pradaxa  tomorrow if Hgb and rigt groin incision site remains stable.  -Plan to remove right groin incision site sutures tomorrow morning.  -Recommend adequate hydration and p.o.  intake.  Recommend PT for deconditioning.  # Chronic HFpEF Patient without shortness of breath or LEE. Chest x-ray with no evidence of congestion or pleural effusions.  BNP elevated at 770.  Echo this admission with EF 70-75%, no wall motion abnormalities, mild LVH, severely elevated PASP. -Continue to hold home PO lasix  40 mg daily as patient does not appear volume up. -Will plan to optimize GDMT as BP and renal function allow, this can be done outpatient as well.  # Hypertension # Hyperlipidemia # Demand ischemia Patient without chest pain. Troponins minimally elevated and flat 21 > 20 > 16 > 22.  EKG with high grade AV block, rate 50 bpm with nonspecific ST-T wave changes.  -D/c home hydrochlorothiazide  due to electrolyte abnormalities, hypokalemia. -Continue losartan  to 50 mg daily.  -Continue metoprolol  as stated above.  This patient's plan of care was discussed and created with Dr. Florencio and he is in agreement.  Signed: Dorene Comfort, PA-C  09/18/2023, 3:13 PM [facility] Card[facility]

## 2023-09-19 ENCOUNTER — Encounter: Payer: Self-pay | Admitting: Cardiology

## 2023-09-19 DIAGNOSIS — J449 Chronic obstructive pulmonary disease, unspecified: Secondary | ICD-10-CM | POA: Diagnosis not present

## 2023-09-19 DIAGNOSIS — I442 Atrioventricular block, complete: Secondary | ICD-10-CM | POA: Diagnosis not present

## 2023-09-19 DIAGNOSIS — R531 Weakness: Secondary | ICD-10-CM | POA: Diagnosis not present

## 2023-09-19 DIAGNOSIS — I5030 Unspecified diastolic (congestive) heart failure: Secondary | ICD-10-CM | POA: Diagnosis not present

## 2023-09-19 LAB — CBC
HCT: 32.3 % — ABNORMAL LOW (ref 36.0–46.0)
Hemoglobin: 10.7 g/dL — ABNORMAL LOW (ref 12.0–15.0)
MCH: 29.6 pg (ref 26.0–34.0)
MCHC: 33.1 g/dL (ref 30.0–36.0)
MCV: 89.2 fL (ref 80.0–100.0)
Platelets: 275 K/uL (ref 150–400)
RBC: 3.62 MIL/uL — ABNORMAL LOW (ref 3.87–5.11)
RDW: 15.8 % — ABNORMAL HIGH (ref 11.5–15.5)
WBC: 15.8 K/uL — ABNORMAL HIGH (ref 4.0–10.5)
nRBC: 0 % (ref 0.0–0.2)

## 2023-09-19 LAB — BASIC METABOLIC PANEL WITH GFR
Anion gap: 6 (ref 5–15)
BUN: 27 mg/dL — ABNORMAL HIGH (ref 8–23)
CO2: 24 mmol/L (ref 22–32)
Calcium: 8.6 mg/dL — ABNORMAL LOW (ref 8.9–10.3)
Chloride: 111 mmol/L (ref 98–111)
Creatinine, Ser: 0.58 mg/dL (ref 0.44–1.00)
GFR, Estimated: >60 mL/min (ref >60)
Glucose, Bld: 134 mg/dL — ABNORMAL HIGH (ref 70–99)
Potassium: 4.4 mmol/L (ref 3.5–5.1)
Sodium: 141 mmol/L (ref 135–145)

## 2023-09-19 MED ORDER — DABIGATRAN ETEXILATE MESYLATE 150 MG PO CAPS
150.0000 mg | ORAL_CAPSULE | Freq: Two times a day (BID) | ORAL | Status: DC
  Administered 2023-09-19 – 2023-09-21 (×5): 150 mg via ORAL
  Filled 2023-09-19 (×5): qty 1

## 2023-09-19 MED ORDER — ORAL CARE MOUTH RINSE
15.0000 mL | OROMUCOSAL | Status: DC | PRN
Start: 2023-09-19 — End: 2023-09-21

## 2023-09-19 MED ORDER — POLYETHYLENE GLYCOL 3350 17 G PO PACK
17.0000 g | PACK | Freq: Every day | ORAL | Status: DC
  Administered 2023-09-19 – 2023-09-20 (×2): 17 g via ORAL
  Filled 2023-09-19 (×2): qty 1

## 2023-09-19 MED ORDER — METOPROLOL TARTRATE 50 MG PO TABS
50.0000 mg | ORAL_TABLET | Freq: Two times a day (BID) | ORAL | Status: DC
  Administered 2023-09-19 – 2023-09-21 (×5): 50 mg via ORAL
  Filled 2023-09-19 (×5): qty 1

## 2023-09-19 NOTE — Progress Notes (Signed)
 Palliative Care Progress Note, Assessment & Plan   Patient Name: [name]       Date: 09/19/2023 DOB: Sep 29, 1933  Age: 89 y.o. MRN#: [id number] Attending Physician: Jhonny Calvin NOVAK, MD Primary Care Physician: Cleotilde Oneil FALCON, MD Admit Date: 09/12/2023  Subjective: Patient is lying in bed, awake and alert.  She acknowledges my presence and is able to make her wishes known.  Her husband [name] and her daughter [name] are present at bedside during my visit.  HPI: 89 y.o. female  with past medical history of HTN, HLD, HFpEF, A-fib on Pradaxa , COPD, bronchiectasis, GERD, breast cancer s/p of a left lumpectomy), Psoriatec arthritis, mild pulmonary hypertension, recent right hip fracture  admitted on 09/12/2023 with weakness and poor PO intake.   Patient is being treated for complete heart block with symptomatic bradycardia, A-fib, bronchiectasis, and COPD.    7/14-PPM ([employer]) placed and patient tolerated procedure without complication.  PMT was consulted to support patient with goals of care discussions.  Summary of counseling/coordination of care: Extensive chart review completed prior to meeting patient including labs, vital signs, imaging, progress notes, orders, and available advanced directive documents from current and previous encounters.   After reviewing the patient's chart and assessing the patient at bedside, I spoke with patient in regards to symptom management and goals of care.   Symptoms assessed.  Patient endorses that her dry mouth has significantly.  She shares she continues to feel overall weak.  Discussed that plan remains for patient to attend rehab for additional PT/OT therapies.  I again discussed boundaries of medical care with patient and family.  Patient continues to share  she wants to discuss her options at a later date/time.  I highlighted importance of advance care planning before an emergency or urgent event occurs.  I again highlighted the importance of making patient's wishes known as far as life-sustaining measures such as ventilators and feeding tubes.  She shares appreciation for the first discussion to a later date/time.  MOST form introduced and copies left for patient to review.  I counseled with attending.  No change to plan of care at this time.  TOC following closely for SNF rehab placement.  Symptom burden is low.  Goals are clear.  PMT will step back from daily visits and monitor the patient peripherally. Family and patient aware that PMT will shadow her chart and progress.  Patient and family have PMT contact info and were encouraged to reach out should any acute palliative needs arise.  Please re-engage with PMT if goals change, at patient/family's request, or if patient's health deteriorates during hospitalization.     Physical Exam Vitals reviewed.  Constitutional:      General: She is not in acute distress.    Appearance: She is normal weight. She is not ill-appearing.  HENT:     Head: Normocephalic.     Mouth/Throat:     Mouth: Mucous membranes are moist.  Eyes:     Pupils: Pupils are equal, round, and reactive to light.  Cardiovascular:     Pulses: Normal pulses.  Pulmonary:     Effort: Pulmonary effort is normal.  Abdominal:     Palpations: Abdomen is soft.  Skin:    General: Skin is dry.     Comments: Generalized weakness  Neurological:     Mental Status: She is alert and oriented to person, place, and time.  Psychiatric:        Mood and Affect: Mood normal.        Behavior: Behavior normal.             Total Time 35 minutes   Time spent includes: Detailed review of medical records (labs, imaging, vital signs), medically appropriate exam (mental status, respiratory, cardiac, skin), discussed with treatment team,  counseling and educating patient, family and staff, documenting clinical information, medication management and coordination of care.  Lamarr L. Arvid, DNP, FNP-BC Palliative Medicine Team

## 2023-09-19 NOTE — Plan of Care (Signed)
  Problem: Education: Goal: Knowledge of General Education information will improve Description: Including pain rating scale, medication(s)/side effects and non-pharmacologic comfort measures Outcome: Progressing   Problem: Clinical Measurements: Goal: Will remain free from infection Outcome: Progressing Goal: Diagnostic test results will improve Outcome: Progressing   Problem: Activity: Goal: Risk for activity intolerance will decrease Outcome: Progressing   Problem: Nutrition: Goal: Adequate nutrition will be maintained Outcome: Progressing   Problem: Coping: Goal: Level of anxiety will decrease Outcome: Progressing   Problem: Pain Managment: Goal: General experience of comfort will improve and/or be controlled Outcome: Progressing   Problem: Safety: Goal: Ability to remain free from injury will improve Outcome: Progressing   Problem: Skin Integrity: Goal: Risk for impaired skin integrity will decrease Outcome: Progressing   Problem: Health Behavior/Discharge Planning: Goal: Ability to manage health-related needs will improve Outcome: Not Progressing

## 2023-09-19 NOTE — Progress Notes (Signed)
 Physical Therapy Treatment Patient Details Name: [name] MRN: [id number] DOB: 1933-12-16 Today's Date: 09/19/2023   History of Present Illness Pt is an 89 y.o. female  with past medical history of HTN, HLD, HFpEF, A-fib on Pradaxa , COPD, bronchiectasis, GERD, breast cancer s/p of a left lumpectomy), Psoriatec arthritis, mild pulmonary hypertension, recent right hip fracture  admitted on 09/12/2023 with weakness and poor PO intake, workup for complete heart block and symptomatic bradycardia, afib, COPD. now s/p pacemaker 09/18/2023.    PT Comments  Additional session this date. Pt still complaining of weakness and takes increased time for all bed mobility/transfers/ambulation this date. Limited endurance due to pain/fatigue. Unable to tolerate household distances at this time and requires close supervision. Discussed with family at bedside and pt regarding disposition. Updated to care team via secure chat. Will continue to progress as able.    If plan is discharge home, recommend the following: A little help with walking and/or transfers;A little help with bathing/dressing/bathroom;Assist for transportation;Help with stairs or ramp for entrance   Can travel by private vehicle     Yes  Equipment Recommendations  BSC/3in1    Recommendations for Other Services       Precautions / Restrictions Precautions Precautions: Fall Recall of Precautions/Restrictions: Intact Restrictions Weight Bearing Restrictions Per Provider Order: Yes RLE Weight Bearing Per Provider Order: Weight bearing as tolerated RLE Partial Weight Bearing Percentage or Pounds: updated MD note says WBAT     Mobility  Bed Mobility Overal bed mobility: Needs Assistance Bed Mobility: Supine to Sit     Supine to sit: Min assist     General bed mobility comments: able to initiate mobility, however eventually does need physical assistance to finish mobility in addition to bed functions and use of railings     Transfers Overall transfer level: Needs assistance Equipment used: Rolling walker (2 wheels) Transfers: Sit to/from Stand, Bed to chair/wheelchair/BSC Sit to Stand: Min assist           General transfer comment: needs cues to push from seated surface. Takes extended time and effort to stand.    Ambulation/Gait Ambulation/Gait assistance: Contact guard assist Gait Distance (Feet): 25 Feet Assistive device: Rolling walker (2 wheels) Gait Pattern/deviations: Step-to pattern       General Gait Details: slow and effortful gait pattern with step to gait pattern and use of RW. Distance limited due to R LE pain and felt it giving out. Min assist provided during turns   Comptroller Bed    Modified Rankin (Stroke Patients Only)       Balance Overall balance assessment: Needs assistance Sitting-balance support: Feet supported, No upper extremity supported Sitting balance-Leahy Scale: Fair     Standing balance support: Bilateral upper extremity supported, Reliant on assistive device for balance Standing balance-Leahy Scale: Fair                              Hotel manager: No apparent difficulties  Cognition Arousal: Alert Behavior During Therapy: WFL for tasks assessed/performed   PT - Cognitive impairments: No apparent impairments                       PT - Cognition Comments: pleasant and agreeable to session. Very soft spoken Following commands: Intact      Cueing Cueing Techniques: Verbal  cues, Gestural cues  Exercises Other Exercises Other Exercises: seated ther-ex performed on B LE including LAQ, SLRs, and AP. 10 reps performed with supervision.    General Comments        Pertinent Vitals/Pain Pain Assessment Pain Assessment: Faces Faces Pain Scale: Hurts a little bit Pain Location: RLE with bed mobility Pain Descriptors / Indicators: Grimacing,  Guarding Pain Intervention(s): Limited activity within patient's tolerance, Repositioned    Home Living                          Prior Function            PT Goals (current goals can now be found in the care plan section) Acute Rehab PT Goals Patient Stated Goal: to feel better PT Goal Formulation: With patient Time For Goal Achievement: 09/29/23 Potential to Achieve Goals: Good Progress towards PT goals: Progressing toward goals    Frequency    Min 2X/week      PT Plan      Co-evaluation              AM-PAC PT 6 Clicks Mobility   Outcome Measure  Help needed turning from your back to your side while in a flat bed without using bedrails?: A Little Help needed moving from lying on your back to sitting on the side of a flat bed without using bedrails?: A Little Help needed moving to and from a bed to a chair (including a wheelchair)?: A Little Help needed standing up from a chair using your arms (e.g., wheelchair or bedside chair)?: A Little Help needed to walk in hospital room?: A Lot Help needed climbing 3-5 steps with a railing? : Total 6 Click Score: 15    End of Session Equipment Utilized During Treatment: Gait belt Activity Tolerance: Patient limited by fatigue Patient left: in chair;with call bell/phone within reach;with chair alarm set Nurse Communication: Mobility status PT Visit Diagnosis: Other abnormalities of gait and mobility (R26.89);Difficulty in walking, not elsewhere classified (R26.2);Muscle weakness (generalized) (M62.81)     Time: 8884-8868 PT Time Calculation (min) (ACUTE ONLY): 16 min  Charges:    $Gait Training: 8-22 mins PT General Charges $$ ACUTE PT VISIT: 1 Visit                     [name], PT, DPT, GCS [phone]    Jakob Kimberlin 09/19/2023, 2:45 PM

## 2023-09-19 NOTE — TOC Progression Note (Signed)
 Transition of Care [facility]) - Progression Note    Patient Details  Name: [name] MRN: [id number] Date of Birth: 1934/01/06  [facility] CM/SW Contact  [name], RN Phone Number: 09/19/2023, 4:26 PM  Clinical Narrative:    Spoke with patient and her family at bedside to give bed offer. Patient daughter is not agreeable to bed offers given for [facility], [facility] and [facility]. She stated she asked for [facility]. Bed search extended to [facility]. Patient daughter advised insurance shara will be required.       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[place])                   DME Agency: Other - Comment (Unknown)                   Social Determinants of Health (SDOH) Interventions SDOH Screenings   Food Insecurity: No Food Insecurity (09/14/2023)  Housing: Low Risk  (09/14/2023)  Transportation Needs: No Transportation Needs (09/14/2023)  Utilities: Not At Risk (09/14/2023)  Alcohol Screen: Low Risk  (02/17/2021)  Depression (PHQ2-9): Medium Risk (11/05/2021)  Financial Resource Strain: Low Risk  (08/10/2023)   Received from [facility]  Physical Activity: Sufficiently Active (02/17/2021)  Social Connections: Moderately Isolated (09/14/2023)  Stress: No Stress Concern Present (02/17/2021)  Tobacco Use: Low Risk  (09/14/2023)  Recent Concern: Tobacco Use - Medium Risk (08/10/2023)   Received from [facility]    Readmission Risk Interventions     No data to display

## 2023-09-19 NOTE — Progress Notes (Signed)
 [facility] CLINIC CARDIOLOGY PROGRESS NOTE       Patient ID: [name] MRN: [id number] DOB/AGE: 1933-10-06 89 y.o.  Admit date: 09/12/2023 Referring Physician Dr. Hilma Primary Physician Cleotilde Oneil FALCON, MD Primary Cardiologist Tinnie Maiden, NP; Dr. Dewane Reason for Consultation complete heart block  HPI: [name] is a 89 y.o. female  with a past medical history of history bradycardia, hypokalemia, paroxysmal atrial fibrillation (on Pradaxa ), chronic HFpEF, mild pulmonary hypertension, hypertension, hyperlipidemia, pulmonary fibrosis, COPD who presented to the ED on 09/12/2023 for worsening generalized weakness and poor intake. Of note patient had a recent fall that resulted in right hip fracture and 08/16/2023 and recently got out of rehab about a week ago.  Heart rates were found to be in the 40s.  Cardiology was consulted for further evaluation.   Interval History: -Patient seen and examined this afternoon, sitting upright in hospital bed. Patient states she feels good and denies chest pain, lightheadedness or dizziness.  -Patient states she feels weak and is eager to do rehab. -BP remains elevated, HR 80s, V-paced per tele. Micra adequately pacing.  -Patient underwent leadless PPM (medtronic) on 07/14 with Dr. Ammon. Patient tolerated procedure well.    Pertinent Cardiac History (Most recent) 3 day Holter monitor:08/01/2023 to 08/04/2023 Predominant rhythm is sinus rhythm. Maximum heart rate 141 bpm, minimum heart rate 57 bpm the average heart rate of 74 bpm Occasional less than 1% PACs and rare PVCs noted 8 occurrences of short lasting supraventricular tachycardia, longest episode 17 beats 0 patient triggers  Impression: Predominant rhythm is sinus rhythm. No significant arrhythmias.   Review of systems complete and found to be negative unless listed above    Past Medical History:  Diagnosis Date   Acquired thrombophilia (HCC)    Acute metabolic encephalopathy  06/19/2020   Allergy    Atherosclerosis of aorta (HCC)    Atrial fibrillation (HCC)    Bronchiectasis (HCC)    Cancer (HCC)    left breast cancer    Chronic nausea    COPD (chronic obstructive pulmonary disease) (HCC)    GERD (gastroesophageal reflux disease)    Grade II diastolic dysfunction    Hyperlipemia    Hypertension    Intractable nausea and vomiting 06/20/2018   Mild concentric left ventricular hypertrophy (LVH)    Mild mitral stenosis by prior echocardiogram    Mild pulmonary hypertension (HCC)    Psoriatic arthritis (HCC)    Stage 2 moderate COPD by GOLD classification [facility])     Past Surgical History:  Procedure Laterality Date   BREAST LUMPECTOMY Left 2008   CATARACT EXTRACTION W/PHACO Left 02/13/2023   Procedure: CATARACT EXTRACTION PHACO AND INTRAOCULAR LENS PLACEMENT (IOC) LEFT  KDB GONIOTOMY;  Surgeon: Enola Feliciano Hugger, MD;  Location: [facility];  Service: Ophthalmology;  Laterality: Left;  10.53 0:55.   CATARACT EXTRACTION W/PHACO Right 02/27/2023   Procedure: CATARACT EXTRACTION PHACO AND INTRAOCULAR LENS PLACEMENT (IOC) RIGHT  [place], goniotomy 15.90 01:28.6;  Surgeon: Enola Feliciano Hugger, MD;  Location: [facility];  Service: Ophthalmology;  Laterality: Right;   CHOLECYSTECTOMY     HIP PINNING,CANNULATED Left 06/05/2020   Procedure: CANNULATED HIP PINNING;  Surgeon: Kathlynn Sharper, MD;  Location: [facility];  Service: Orthopedics;  Laterality: Left;   HIP PINNING,CANNULATED Right 08/16/2023   Procedure: FIXATION, FEMUR, NECK, PERCUTANEOUS, USING SCREW;  Surgeon: Edie Norleen PARAS, MD;  Location: [facility];  Service: Orthopedics;  Laterality: Right;   PARTIAL HYSTERECTOMY     TEE WITHOUT  CARDIOVERSION N/A 06/10/2020   Procedure: TRANSESOPHAGEAL ECHOCARDIOGRAM (TEE);  Surgeon: Darliss Rogue, MD;  Location: [facility];  Service: Cardiovascular;  Laterality: N/A;    Medications Prior to Admission  Medication Sig Dispense Refill Last Dose/Taking    amiodarone  (PACERONE ) 200 MG tablet Take 1 tablet (200 mg total) by mouth daily. 30 tablet 0 09/12/2023 Morning   dabigatran  (PRADAXA ) 150 MG CAPS capsule Take 1 capsule (150 mg total) by mouth every 12 (twelve) hours. 60 capsule 0 09/12/2023 at  8:00 AM   docusate sodium  (COLACE) 100 MG capsule Take 1 capsule (100 mg total) by mouth 2 (two) times daily. 10 capsule 0 Past Month   Fluticasone -Umeclidin-Vilant (TRELEGY ELLIPTA) 100-62.5-25 MCG/ACT AEPB Inhale 1 puff into the lungs daily.   09/12/2023 Morning   folic acid  (FOLVITE ) 1 MG tablet Take 1 mg by mouth daily. Pt takes this med the days she does not take methotrexate    09/12/2023 Morning   hydrochlorothiazide  (HYDRODIURIL ) 25 MG tablet Take 1 tablet by mouth daily.   09/12/2023 Morning   HYDROcodone -acetaminophen  (NORCO/VICODIN) 5-325 MG tablet Take 1-2 tablets by mouth every 4 (four) hours as needed for moderate pain (pain score 4-6). 30 tablet 0 09/12/2023   LORazepam  (ATIVAN ) 0.5 MG tablet Take 1 tablet (0.5 mg total) by mouth at bedtime. 10 tablet 0 09/12/2023   methotrexate  (RHEUMATREX) 2.5 MG tablet Take 5 mg by mouth once a week. Caution:Chemotherapy. Protect from light. Pt take 2 weekly.   09/10/2023   metoCLOPramide  (REGLAN ) 10 MG tablet Take 1 tablet (10 mg total) by mouth 4 (four) times daily -  before meals and at bedtime. 120 tablet 0 09/12/2023 Morning   metoprolol  tartrate (LOPRESSOR ) 25 MG tablet Take 1.5 tablets (37.5 mg total) by mouth 2 (two) times daily. 90 tablet 0 Past Week   omeprazole  (PRILOSEC) 40 MG capsule Take 40 mg by mouth daily.   09/12/2023 Morning   ondansetron  (ZOFRAN ) 4 MG tablet Take 4 mg by mouth every 8 (eight) hours as needed for nausea or vomiting.   Unknown   [Paused] furosemide  (LASIX ) 40 MG tablet Take 1 tablet (40 mg total) by mouth daily. 30 tablet 0    Social History   Socioeconomic History   Marital status: Married    Spouse name: Not on file   Number of children: 2   Years of education: Not on file   Highest  education level: Not on file  Occupational History   Not on file  Tobacco Use   Smoking status: Never   Smokeless tobacco: Never  Vaping Use   Vaping status: Never Used  Substance and Sexual Activity   Alcohol use: No   Drug use: No   Sexual activity: Not on file  Other Topics Concern   Not on file  Social History Narrative   Not on file   Social Drivers of Health   Financial Resource Strain: Low Risk  (08/10/2023)   Received from [facility]   Overall Financial Resource Strain (CARDIA)    Difficulty of Paying Living Expenses: Not hard at all  Food Insecurity: No Food Insecurity (09/14/2023)   Hunger Vital Sign    Worried About Running Out of Food in the Last Year: Never true    Ran Out of Food in the Last Year: Never true  Transportation Needs: No Transportation Needs (09/14/2023)   PRAPARE - Administrator, Civil Service (Medical): No    Lack of Transportation (Non-Medical): No  Physical Activity: Sufficiently  Active (02/17/2021)   Exercise Vital Sign    Days of Exercise per Week: 7 days    Minutes of Exercise per Session: 30 min  Stress: No Stress Concern Present (02/17/2021)   [employer] of Occupational Health - Occupational Stress Questionnaire    Feeling of Stress : Not at all  Social Connections: Moderately Isolated (09/14/2023)   Social Connection and Isolation Panel    Frequency of Communication with Friends and Family: More than three times a week    Frequency of Social Gatherings with Friends and Family: Three times a week    Attends Religious Services: Never    Active Member of Clubs or Organizations: No    Attends Banker Meetings: Never    Marital Status: Married  Catering manager Violence: Not At Risk (09/14/2023)   Humiliation, Afraid, Rape, and Kick questionnaire    Fear of Current or Ex-Partner: No    Emotionally Abused: No    Physically Abused: No    Sexually Abused: No    Family History  Problem  Relation Age of Onset   Breast cancer Sister      Vitals:   09/18/23 1521 09/18/23 1954 09/18/23 2301 09/19/23 0511  BP: 122/62 133/73 115/62   Pulse: 98 100 90   Resp:  20 (!) 22   Temp: 98.2 F (36.8 C) 97.6 F (36.4 C) 97.6 F (36.4 C)   TempSrc:      SpO2: 95% 93% 93%   Weight:    51.4 kg  Height:        PHYSICAL EXAM General: Chronically ill-appearing elderly female, well nourished, in no acute distress. HEENT: Normocephalic and atraumatic. Neck: No JVD.   Lungs: Normal respiratory effort on room air. Clear bilaterally to auscultation. No wheezes, crackles, rhonchi.  Heart: HRRR. Normal S1 and S2 without gallops or murmurs.  Abdomen: Non-distended appearing.  Msk: Normal strength and tone for age. Extremities: Warm and well perfused. No clubbing, cyanosis, edema.  Neuro: Alert and oriented X 3. Psych: Answers questions appropriately.   Labs: Basic Metabolic Panel: Recent Labs    09/18/23 0705 09/19/23 0302  NA 140 141  K 4.7 4.4  CL 111 111  CO2 22 24  GLUCOSE 112* 134*  BUN 21 27*  CREATININE 0.78 0.58  CALCIUM  8.6* 8.6*   Liver Function Tests: No results for input(s): AST, ALT, ALKPHOS, BILITOT, PROT, ALBUMIN in the last 72 hours.  No results for input(s): LIPASE, AMYLASE in the last 72 hours. CBC: Recent Labs    09/18/23 0705 09/19/23 0302  WBC 16.0* 15.8*  HGB 11.1* 10.7*  HCT 33.3* 32.3*  MCV 88.8 89.2  PLT 300 275   Cardiac Enzymes: No results for input(s): CKTOTAL, CKMB, CKMBINDEX, TROPONINIHS in the last 72 hours.  BNP: No results for input(s): BNP in the last 72 hours.  D-Dimer: No results for input(s): DDIMER in the last 72 hours. Hemoglobin A1C: No results for input(s): HGBA1C in the last 72 hours. Fasting Lipid Panel: No results for input(s): CHOL, HDL, LDLCALC, TRIG, CHOLHDL, LDLDIRECT in the last 72 hours. Thyroid  Function Tests: No results for input(s): TSH, T4TOTAL, T3FREE,  THYROIDAB in the last 72 hours.  Invalid input(s): FREET3  Anemia Panel: No results for input(s): VITAMINB12, FOLATE, FERRITIN, TIBC, IRON , RETICCTPCT in the last 72 hours.   Radiology: EP PPM/ICD IMPLANT Result Date: 09/18/2023 Successful Medtronic Micra AV 2 implantation   DG HIP PORT UNILAT WITH PELVIS 1V RIGHT Result Date: 09/15/2023 CLINICAL DATA:  Right  hip pain, postop 1 month EXAM: DG HIP (WITH OR WITHOUT PELVIS) 1V PORT RIGHT COMPARISON:  Radiographs 08/16/2023 FINDINGS: Screw fixation of both femoral necks. No radiographic evidence of loosening. No acute fracture or dislocation. Degenerative changes pubic symphysis, both hips, SI joints and lower lumbar spine. IMPRESSION: Screw fixation of both femoral necks. No radiographic evidence of loosening. No acute fracture. Electronically Signed   By: Norman Gatlin M.D.   On: 09/15/2023 19:46   ECHOCARDIOGRAM COMPLETE Result Date: 09/15/2023    ECHOCARDIOGRAM REPORT   Patient Name:   [name] Salomone Date of Exam: 09/15/2023 Medical Rec #:  [id number]     Height:       62.0 in Accession #:    [id number]    Weight:       137.8 lb Date of Birth:  January 07, 1934     BSA:          1.632 m Patient Age:    89 years      BP:           167/48 mmHg Patient Gender: F             HR:           42 bpm. Exam Location:  [facility] Procedure: 2D Echo, Cardiac Doppler and Color Doppler (Both Spectral and Color            Flow Doppler were utilized during procedure). Indications:     Abnormal ECG R94.31  History:         Patient has prior history of Echocardiogram examinations, most                  recent 05/19/2023. Arrythmias:Atrial Fibrillation; Risk                  Factors:Hypertension.  Sonographer:     Christopher Furnace Referring Phys:  [id number] Eron Staat Diagnosing Phys: Keller Alluri IMPRESSIONS  1. Left ventricular ejection fraction, by estimation, is 70 to 75%. The left ventricle has hyperdynamic function. The left ventricle has no regional wall motion  abnormalities. There is mild left ventricular hypertrophy. Left ventricular diastolic parameters are indeterminate.  2. Right ventricular systolic function is normal. The right ventricular size is normal. There is severely elevated pulmonary artery systolic pressure. The estimated right ventricular systolic pressure is 63.7 mmHg.  3. Trivial mitral valve regurgitation. Mild mitral stenosis. Severe mitral annular calcification.  4. The aortic valve is tricuspid. Aortic valve regurgitation is not visualized. FINDINGS  Left Ventricle: Left ventricular ejection fraction, by estimation, is 70 to 75%. The left ventricle has hyperdynamic function. The left ventricle has no regional wall motion abnormalities. The left ventricular internal cavity size was normal in size. There is mild left ventricular hypertrophy. Left ventricular diastolic parameters are indeterminate. Right Ventricle: The right ventricular size is normal. No increase in right ventricular wall thickness. Right ventricular systolic function is normal. There is severely elevated pulmonary artery systolic pressure. The tricuspid regurgitant velocity is 3.83 m/s, and with an assumed right atrial pressure of 5 mmHg, the estimated right ventricular systolic pressure is 63.7 mmHg. Left Atrium: Left atrial size was normal in size. Right Atrium: Right atrial size was normal in size. Pericardium: There is no evidence of pericardial effusion. Mitral Valve: There is mild thickening of the mitral valve leaflet(s). Severe mitral annular calcification. Trivial mitral valve regurgitation. Mild mitral valve stenosis. MV peak gradient, 16.5 mmHg. The mean mitral valve gradient is 6.0 mmHg. Tricuspid Valve: The tricuspid valve  is normal in structure. Tricuspid valve regurgitation is mild. Aortic Valve: The aortic valve is tricuspid. Aortic valve regurgitation is not visualized. Aortic valve mean gradient measures 5.0 mmHg. Aortic valve peak gradient measures 7.8 mmHg. Aortic  valve area, by VTI measures 1.81 cm. Pulmonic Valve: The pulmonic valve was not well visualized. Pulmonic valve regurgitation is trivial. Aorta: The aortic root and ascending aorta are structurally normal, with no evidence of dilitation. Venous: The inferior vena cava was not well visualized. IAS/Shunts: The atrial septum is grossly normal. Additional Comments: There is pleural effusion in the left lateral region.  LEFT VENTRICLE PLAX 2D LVIDd:         3.50 cm   Diastology LVIDs:         2.60 cm   LV e' medial:    4.68 cm/s LV PW:         1.10 cm   LV E/e' medial:  31.6 LV IVS:        1.30 cm   LV e' lateral:   6.74 cm/s LVOT diam:     2.00 cm   LV E/e' lateral: 22.0 LV SV:         54 LV SV Index:   33 LVOT Area:     3.14 cm  RIGHT VENTRICLE RV Basal diam:  2.80 cm RV Mid diam:    2.70 cm LEFT ATRIUM             Index        RIGHT ATRIUM           Index LA diam:        4.40 cm 2.70 cm/m   RA Area:     15.30 cm LA Vol (A2C):   32.7 ml 20.04 ml/m  RA Volume:   40.60 ml  24.88 ml/m LA Vol (A4C):   39.2 ml 24.02 ml/m LA Biplane Vol: 36.2 ml 22.19 ml/m  AORTIC VALVE AV Area (Vmax):    1.61 cm AV Area (Vmean):   1.50 cm AV Area (VTI):     1.81 cm AV Vmax:           140.00 cm/s AV Vmean:          102.350 cm/s AV VTI:            0.298 m AV Peak Grad:      7.8 mmHg AV Mean Grad:      5.0 mmHg LVOT Vmax:         71.90 cm/s LVOT Vmean:        48.900 cm/s LVOT VTI:          0.171 m LVOT/AV VTI ratio: 0.57  AORTA Ao Root diam: 2.50 cm MITRAL VALVE                TRICUSPID VALVE MV Area (PHT): 2.36 cm     TR Peak grad:   58.7 mmHg MV Area VTI:   0.78 cm     TR Vmax:        383.00 cm/s MV Peak grad:  16.5 mmHg MV Mean grad:  6.0 mmHg     SHUNTS MV Vmax:       2.03 m/s     Systemic VTI:  0.17 m MV Vmean:      113.0 cm/s   Systemic Diam: 2.00 cm MV Decel Time: 322 msec MV E velocity: 148.00 cm/s MV A velocity: 121.00 cm/s MV E/A ratio:  1.22 Keller Paterson Electronically signed by Keller Paterson  Signature Date/Time:  09/15/2023/1:31:34 PM    Final    DG Chest Portable 1 View Result Date: 09/12/2023 CLINICAL DATA:  Weakness, shortness of breath EXAM: PORTABLE CHEST 1 VIEW COMPARISON:  07/18/2023 FINDINGS: Heart and mediastinal contours within normal limits. Aortic atherosclerosis. Right lung clear. Patchy opacity in the left lower lung again noted, improved since prior study. No visible effusions or acute bony abnormality. Biapical scarring. IMPRESSION: Persistent but improving left basilar atelectasis or infiltrate. No visible effusions. Electronically Signed   By: Franky Crease M.D.   On: 09/12/2023 19:30    ECHO as above   05/19/2023 1. Left ventricular ejection fraction, by estimation, is 60 to 65%. The left ventricle has normal function. The left ventricle has no regional wall motion abnormalities. There is mild left ventricular hypertrophy. Left ventricular diastolic parameters are consistent with Grade II diastolic dysfunction (pseudonormalization).   2. Right ventricular systolic function is normal. The right ventricular size is normal. There is mildly elevated pulmonary artery systolic pressure.   3. Left atrial size was mildly dilated.   4. The mitral valve is normal in structure. No evidence of mitral valve regurgitation. Mild mitral stenosis. The mean mitral valve gradient is 4.0 mmHg. Severe mitral annular calcification.   5. Tricuspid valve regurgitation is mild to moderate.   6. The aortic valve is normal in structure. Aortic valve regurgitation is not visualized. Aortic valve sclerosis is present, with no evidence of aortic valve stenosis.   7. The inferior vena cava is normal in size with greater than 50% respiratory variability, suggesting right atrial pressure of 3 mmHg.    TELEMETRY reviewed by me 09/19/2023: V-paced, rate 80s  EKG reviewed by me:  complete heart block, rate 50 bpm with nonspecific ST-T wave changes  Data reviewed by me 09/19/2023: last 24h vitals tele labs imaging I/O  hospitalist progress notes.  Principal Problem:   Complete heart block (HCC) Active Problems:   Psoriasis vulgaris   Primary hypertension   Bronchiectasis (HCC)   Chronic obstructive pulmonary disease (HCC)   Leukocytosis   (HFpEF) heart failure with preserved ejection fraction (HCC)   Psoriatic arthritis (HCC)   Myocardial injury   Atrial fibrillation, chronic (HCC)   Symptomatic bradycardia   Malnutrition of moderate degree   Anorexia   Nausea and vomiting    ASSESSMENT AND PLAN:  KYLIE SIMMONDS is a 89 y.o. female  with a past medical history of history bradycardia, hypokalemia, paroxysmal atrial fibrillation (on Pradaxa ), chronic HFpEF, mild pulmonary hypertension, hyperlipidemia, pulmonary fibrosis, COPD who presented to the ED on 09/12/2023 for worsening generalized weakness and poor intake.  Of note patient had a recent fall that resulted in right hip fracture and 08/16/2023 and recently got out of rehab about a week ago.  Heart rates were found to be in the 40s.  Cardiology was consulted for further evaluation.  # Complete heart block s/p leadless PPM (Medtronic - 09/18/23) # History of bradycardia # Paroxysmal atrial fibrillation Patient presents with worsening generalized weakness and poor intake for the last several weeks.  Per husband heart rates have been running in mid 40s for the last several weeks.  EKG in ED revealed high-grade AV block with rate 50.  Per telemetry rate remains in 40s.  TSH within normal limits. Patient underwent leadless PPM (medtronic) on 07/14 with Dr. Ammon. Patient tolerated procedure well. Per tele V-paced 90s. Labs stable s/p micra.  -Monitor and replenish electrolytes for a goal K >4, Mag >2  -Increased metoprolol   tartrate 50 mg BID. Uptitarte as BP allows if HR remains elevated. -Resume home Pradaxa  tomorrow if Hgb and rigt groin incision site remains stable.   The patient was brought to the cardiac cath lab and underwent  micra leadless  pacemaker placement with Dr. Marsa Dooms on 07/14. The patient tolerated with procedure well without complications.  On right groin the incision site was examined and found to be without significant erythema, tenderness to palpation, or apparent aneurysm. Figure of eight suture was removed at the beside by me  and was covered with dry gauze and tegaderm dressing. The patient was given care instructions and warning symptoms to look out at the incision site and will follow up in office in 1 week, or sooner if needed.    # Chronic HFpEF Patient without shortness of breath or LEE. Chest x-ray with no evidence of congestion or pleural effusions.  BNP elevated at 770.  Echo this admission with EF 70-75%, no wall motion abnormalities, mild LVH, severely elevated PASP. -Continue to hold home PO lasix  40 mg daily as patient does not appear volume up. -Will plan to optimize GDMT as BP and renal function allow, this can be done outpatient as well.  # Hypertension # Hyperlipidemia # Demand ischemia Patient without chest pain. Troponins minimally elevated and flat 21 > 20 > 16 > 22.  EKG with high grade AV block, rate 50 bpm with nonspecific ST-T wave changes.  -D/c home hydrochlorothiazide  due to electrolyte abnormalities, hypokalemia. -Continue losartan  to 50 mg daily.  -Continue metoprolol  as stated above.  Ok for discharge today from a cardiac perspective. Plan for patient to go to rehab. Follow up in clinic with Dr. Wilburn on 07/28 at 8 AM. Will sign off.   This patient's plan of care was discussed and created with Dr. Florencio and he is in agreement.  Signed: Dorene Comfort, PA-C  09/19/2023, 7:34 AM [facility]

## 2023-09-19 NOTE — Progress Notes (Signed)
 PROGRESS NOTE    [name]  FMW:[id number] DOB: Jun 21, 1933 DOA: 09/12/2023 PCP: Cleotilde Oneil FALCON, MD    Brief Narrative:  89 y.o. female with medical history significant of HTN, HLD, HFpEF, A-fib on Pradaxa , COPD, bronchiectasis, GERD, breast cancer s/p of a left lumpectomy), Psoriatec arthritis, mild pulmonary hypertension, recent right hip fracture, who presents with weakness.   Patient was recently hospitalized from 6/11 - 6/16 due to mechanical fall and right hip fracture.  Patient was s/p of right hip surgery.  Patient states that her surgical site has been healing well.  In the past several days she has been feeling generalized weak, with poor appetite and decreased oral intake, cannot ambulate without assistance due to weakness.  No fall.  She has nausea, no vomiting, diarrhea or abdominal pain.  No chest pain, cough, SOB.  Denies symptoms of UTI.  No rectal bleeding or dark stool.  Per her husband, patient's heart rate was at 40s when he checked it for her at home.   Pt was found to have new complete heart block on EKG with heart rate in 40s in ED. Pt is taking amiodarone  200 mg daily and metoprolol  37.5 mg twice daily for A-fib currently.  Status post pacemaker implantation 7/14.  Tolerated well.  He is in paced rhythm after procedure    Assessment & Plan:   Principal Problem:   Complete heart block (HCC) Active Problems:   Atrial fibrillation, chronic (HCC)   Myocardial injury   (HFpEF) heart failure with preserved ejection fraction (HCC)   Primary hypertension   Leukocytosis   Bronchiectasis (HCC)   Chronic obstructive pulmonary disease (HCC)   Psoriasis vulgaris   Psoriatic arthritis (HCC)   Symptomatic bradycardia   Malnutrition of moderate degree   Anorexia   Nausea and vomiting  High degree heart block Symptomatic bradycardia Currently hemodynamically stable.  No chest pain or shortness of breath.  Heart rate 40.  EDP consulted with cardiology who recommend  holding metoprolol  and amiodarone .  Remains in heart block.  Was previously refusing PPM but now agreeable upon repeat conversation. Now status post pacemaker on 7/14 Plan: Continue holding amiodarone  indefinitely.  Metoprolol  resumed.  Resume DOAC Pradaxa  today.  Medically ready for discharge and pending skilled nursing facility.  Chronic A-fib: Pradaxa  resumed.  Metoprolol  resumed.  In paced rhythm.  Continue telemetry for now.  Amiodarone  held indefinitely.   Myocardial injury:  Troponin peaked at 21.  Suspect demand ischemia.  (HFpEF) heart failure with preserved ejection fraction (HCC):  2D echo on 05/19/2023 showed EF 60 to 65% with grade 2 diastolic dysfunction.  Patient does not have leg edema or JVD.  No SOB.  CHF seems to be compensated. - Diuretic on hold.  Consider holding on discharge   Primary hypertension HCTZ on hold.  Continue losartan .  Continue metoprolol .   Leukocytosis:  Appears to be WBC 23.2, no source infection identified so far.   Appears to be chronic issue .  No indication of infection.  Continue to hold antibiotics.   Bronchiectasis (HCC) and Chronic obstructive pulmonary disease (HCC):  stable.  No wheezing, no SOB. - Continue bronchodilators and as needed Mucinex    Hx of psoriasis vulgaris and psoriatic arthritis (HCC): - Patient is on weekly methotrexate  (every Sunday).  Defer to outpatient  Dry heaving Poor p.o. intake Intractable nausea Appears improved.  Appetite waxes and wanes.  No plans for endoscopy.  On p.o. PPI and p.o. Reglan .  Will continue.   DVT prophylaxis:  IV heparin  Code Status: Full Family Communication: Husband at bedside 7/9, 7/10, 7/11, 7/12, 7/13, 7/15.  Daughter at bedside 7/10, 7/11, 7/14, 7/15 Disposition Plan: Status is: Inpatient Remains inpatient appropriate because: Multiple acute issues as above   Level of care: Telemetry Cardiac  Consultants:  Cardiology  Procedures:   None  Antimicrobials: None   Subjective: Seen and examined.  Family at bedside.  Energy level slightly improved today. Objective: Vitals:   09/18/23 2301 09/19/23 0511 09/19/23 0819 09/19/23 1133  BP: 115/62  (!) 153/65 (!) 143/61  Pulse: 90  (!) 59 (!) 50  Resp: (!) 22  16 16   Temp: 97.6 F (36.4 C)  97.9 F (36.6 C) (!) 97.4 F (36.3 C)  TempSrc:      SpO2: 93%  93% 94%  Weight:  51.4 kg    Height:        Intake/Output Summary (Last 24 hours) at 09/19/2023 1502 Last data filed at 09/19/2023 0900 Gross per 24 hour  Intake 960 ml  Output --  Net 960 ml   Filed Weights   09/18/23 0329 09/18/23 0502 09/19/23 0511  Weight: 51.3 kg 51.6 kg 51.4 kg    Examination:  General exam: NAD.  Fatigued Respiratory system: Poor respiratory effort.  Normal work of breathing.  Room air Cardiovascular system: S1-S2, RRR no murmurs, no pedal edema Gastrointestinal system: Soft, NT/ND, normal bowel sounds Central nervous system: Alert and oriented. No focal neurological deficits. Extremities: Decreased power bilateral lower extremities Skin: No rashes, lesions or ulcers Psychiatry: Judgement and insight appear impaired. Mood & affect flattened.     Data Reviewed: I have personally reviewed following labs and imaging studies  CBC: Recent Labs  Lab 09/12/23 1745 09/13/23 0353 09/15/23 0640 09/16/23 0428 09/17/23 0714 09/18/23 0705 09/19/23 0302  WBC 23.2*   < > 14.5* 13.8* 15.3* 16.0* 15.8*  NEUTROABS 19.9*  --   --   --   --   --   --   HGB 14.1   < > 11.5* 11.6* 11.4* 11.1* 10.7*  HCT 41.7   < > 33.9* 35.9* 35.0* 33.3* 32.3*  MCV 87.8   < > 88.3 90.0 89.1 88.8 89.2  PLT 273   < > 231 251 295 300 275   < > = values in this interval not displayed.   Basic Metabolic Panel: Recent Labs  Lab 09/12/23 1745 09/13/23 0353 09/14/23 0343 09/15/23 0640 09/17/23 0714 09/18/23 0705 09/19/23 0302  NA 135   < > 137 137 136 140 141  K 3.5   < > 3.7 4.8 4.6 4.7 4.4  CL 98    < > 102 105 104 111 111  CO2 24   < > 23 23 23 22 24   GLUCOSE 120*   < > 124* 110* 103* 112* 134*  BUN 22   < > 30* 24* 18 21 27*  CREATININE 0.64   < > 0.47 0.57 0.61 0.78 0.58  CALCIUM  8.9   < > 8.3* 8.5* 8.7* 8.6* 8.6*  MG 1.9  --  1.8 2.4  --   --   --    < > = values in this interval not displayed.   GFR: Estimated Creatinine Clearance: 39.2 mL/min (by C-G formula based on SCr of 0.58 mg/dL). Liver Function Tests: Recent Labs  Lab 09/12/23 1745  AST 20  ALT 12  ALKPHOS 87  BILITOT 1.0  PROT 7.2  ALBUMIN 3.3*   No results for input(s): LIPASE, AMYLASE in  the last 168 hours. No results for input(s): AMMONIA in the last 168 hours. Coagulation Profile: Recent Labs  Lab 09/12/23 2322  INR 1.3*   Cardiac Enzymes: No results for input(s): CKTOTAL, CKMB, CKMBINDEX, TROPONINI in the last 168 hours. BNP (last 3 results) No results for input(s): PROBNP in the last 8760 hours. HbA1C: No results for input(s): HGBA1C in the last 72 hours. CBG: No results for input(s): GLUCAP in the last 168 hours. Lipid Profile: No results for input(s): CHOL, HDL, LDLCALC, TRIG, CHOLHDL, LDLDIRECT in the last 72 hours. Thyroid  Function Tests: No results for input(s): TSH, T4TOTAL, FREET4, T3FREE, THYROIDAB in the last 72 hours.  Anemia Panel: No results for input(s): VITAMINB12, FOLATE, FERRITIN, TIBC, IRON , RETICCTPCT in the last 72 hours. Sepsis Labs: No results for input(s): PROCALCITON, LATICACIDVEN in the last 168 hours.  Recent Results (from the past 240 hours)  Surgical PCR screen     Status: None   Collection Time: 09/17/23  8:00 PM   Specimen: Nasal Mucosa; Nasal Swab  Result Value Ref Range Status   MRSA, PCR NEGATIVE NEGATIVE Final   Staphylococcus aureus NEGATIVE NEGATIVE Final    Comment: (NOTE) The Xpert SA Assay (FDA approved for NASAL specimens in patients 45 years of age and older), is one component of a  comprehensive surveillance program. It is not intended to diagnose infection nor to guide or monitor treatment. Performed at [facility], [address] [place], [place] [address]          Radiology Studies: EP PPM/ICD IMPLANT Result Date: 09/18/2023 Successful Medtronic Micra AV 2 implantation        Scheduled Meds:  budesonide -glycopyrrolate -formoterol   2 puff Inhalation BID   dabigatran   150 mg Oral Q12H   dexamethasone  (DECADRON ) injection  4 mg Intravenous Q24H   feeding supplement  237 mL Oral BID BM   folic acid   1 mg Oral Daily   LORazepam   0.5 mg Oral QHS   losartan   50 mg Oral Daily   metoCLOPramide   10 mg Oral TID AC   metoprolol  tartrate  50 mg Oral BID   mirtazapine   15 mg Oral QHS   multivitamin with minerals  1 tablet Oral Daily   pantoprazole   40 mg Oral Daily   polyethylene glycol  17 g Oral Daily   sodium chloride  flush  3 mL Intravenous Q12H   Continuous Infusions:     LOS: 7 days      Olivia KATHEE Robson, MD [facility]   If 7PM-7AM, please contact night-coverage  09/19/2023, 3:02 PM

## 2023-09-20 DIAGNOSIS — I442 Atrioventricular block, complete: Secondary | ICD-10-CM | POA: Diagnosis not present

## 2023-09-20 LAB — CBC
HCT: 35.4 % — ABNORMAL LOW (ref 36.0–46.0)
Hemoglobin: 11.5 g/dL — ABNORMAL LOW (ref 12.0–15.0)
MCH: 29.3 pg (ref 26.0–34.0)
MCHC: 32.5 g/dL (ref 30.0–36.0)
MCV: 90.3 fL (ref 80.0–100.0)
Platelets: 271 K/uL (ref 150–400)
RBC: 3.92 MIL/uL (ref 3.87–5.11)
RDW: 15.9 % — ABNORMAL HIGH (ref 11.5–15.5)
WBC: 18.5 K/uL — ABNORMAL HIGH (ref 4.0–10.5)
nRBC: 0 % (ref 0.0–0.2)

## 2023-09-20 LAB — BASIC METABOLIC PANEL WITH GFR
Anion gap: 10 (ref 5–15)
BUN: 25 mg/dL — ABNORMAL HIGH (ref 8–23)
CO2: 23 mmol/L (ref 22–32)
Calcium: 8.9 mg/dL (ref 8.9–10.3)
Chloride: 107 mmol/L (ref 98–111)
Creatinine, Ser: 0.68 mg/dL (ref 0.44–1.00)
GFR, Estimated: >60 mL/min (ref >60)
Glucose, Bld: 113 mg/dL — ABNORMAL HIGH (ref 70–99)
Potassium: 4.4 mmol/L (ref 3.5–5.1)
Sodium: 140 mmol/L (ref 135–145)

## 2023-09-20 NOTE — Progress Notes (Signed)
 Progress Note    [name]  FMW:[id number] DOB: February 08, 1934  DOA: 09/12/2023 PCP: Cleotilde Oneil FALCON, MD      Brief Narrative:    Medical records reviewed and are as summarized below:  [name] is a 89 y.o. female  with medical history significant of HTN, HLD, HFpEF, A-fib on Pradaxa , COPD, bronchiectasis, GERD, breast cancer s/p of a left lumpectomy), Psoriatec arthritis, mild pulmonary hypertension, recent right hip fracture, who presented to the hospital with general weakness, poor oral intake and poor appetite.  According to her husband, her heart rate was in the 40s at home.  Patient was recently hospitalized from 6/11 - 6/16 due to mechanical fall and right hip fracture. Patient was s/p of right hip surgery. Patient states that her surgical site has been healing well.    Patient was found to have complete heart block on EKG with heart rate in the 40s in the emergency department.  She was on amiodarone  and metoprolol  for A-fib.  Status post permanent pacemaker implantation on 09/18/2023.  She tolerated this well.    Assessment/Plan:   Principal Problem:   Complete heart block (HCC) Active Problems:   Atrial fibrillation, chronic (HCC)   Myocardial injury   (HFpEF) heart failure with preserved ejection fraction (HCC)   Primary hypertension   Leukocytosis   Bronchiectasis (HCC)   Chronic obstructive pulmonary disease (HCC)   Psoriasis vulgaris   Psoriatic arthritis (HCC)   Symptomatic bradycardia   Malnutrition of moderate degree   Anorexia   Nausea and vomiting   Nutrition Problem: Moderate Malnutrition Etiology: chronic illness (CHF)  Signs/Symptoms: mild fat depletion, moderate fat depletion, mild muscle depletion, moderate muscle depletion   Body mass index is 20.25 kg/m.    High degree heart block Symptomatic bradycardia S/p pacemaker placement on 09/18/2023 Heart rate is stable. Continue metoprolol ..  Cardiology recommending holding  amiodarone  indefinitely. Continue Pradaxa .    Chronic A-fib: Continue metoprolol  and Pradaxa     Myocardial injury:  Troponin peaked at 21.  Suspect demand ischemia.    (HFpEF) heart failure with preserved ejection fraction (HCC):  Compensated 2D echo on 05/19/2023 showed EF 60 to 65% with grade 2 diastolic dysfunction.   - Diuretic on hold.  Consider holding on discharge    Primary hypertension Continue losartan  and metoprolol  HCTZ on hold     Leukocytosis:  No source infection identified so far.   Appears to be chronic issue . No indication for antibiotics at this time   Bronchiectasis [facility]) and Chronic obstructive pulmonary disease (HCC):  stable.   - Continue bronchodilators and as needed Mucinex     Hx of psoriasis vulgaris and psoriatic arthritis (HCC): - Patient is on weekly methotrexate  (every Sunday).  Defer to outpatient   Dry heaving Poor p.o. intake Intractable nausea Improved Continue Protonix  and Reglan  as needed          Diet Order             Diet Heart Room service appropriate? Yes; Fluid consistency: Thin  Diet effective now                            Consultants: Cardiologist  Procedures: Pacemaker placement on 09/18/2023    Medications:    budesonide -glycopyrrolate -formoterol   2 puff Inhalation BID   dabigatran   150 mg Oral Q12H   dexamethasone  (DECADRON ) injection  4 mg Intravenous Q24H   feeding supplement  237 mL Oral  BID BM   folic acid   1 mg Oral Daily   LORazepam   0.5 mg Oral QHS   losartan   50 mg Oral Daily   metoCLOPramide   10 mg Oral TID AC   metoprolol  tartrate  50 mg Oral BID   mirtazapine   15 mg Oral QHS   multivitamin with minerals  1 tablet Oral Daily   pantoprazole   40 mg Oral Daily   sodium chloride  flush  3 mL Intravenous Q12H   Continuous Infusions:   Anti-infectives (From admission, onward)    None              Family Communication/Anticipated D/C date and plan/Code Status    DVT prophylaxis:  dabigatran  (PRADAXA ) capsule 150 mg     Code Status: Full Code  Family Communication: Plan discussed with her husband and Arland (daughter) at the bedside Disposition Plan: Plan to discharge to SNF   Status is: Inpatient Remains inpatient appropriate because: Awaiting placement to SNF       Subjective:   Interval events noted.  She has no complaints.  She said I am full.  She just had breakfast.  Husband and daughter at the bedside.  Objective:    Vitals:   09/20/23 0953 09/20/23 0959 09/20/23 1152 09/20/23 1226  BP: (!) 180/65 (!) 180/65 (!) 143/59 (!) 123/57  Pulse:  70 64 68  Resp:   20   Temp:   97.6 F (36.4 C) (!) 97.5 F (36.4 C)  TempSrc:   Oral   SpO2:   96% 94%  Weight:      Height:       No data found.   Intake/Output Summary (Last 24 hours) at 09/20/2023 1445 Last data filed at 09/20/2023 1436 Gross per 24 hour  Intake 470 ml  Output 125 ml  Net 345 ml   Filed Weights   09/18/23 0502 09/19/23 0511 09/20/23 0501  Weight: 51.6 kg 51.4 kg 50.2 kg    Exam:  GEN: NAD SKIN: Warm and dry EYES: EOMI ENT: MMM CV: RRR PULM: CTA B ABD: soft, ND, NT, +BS CNS: AAO x 3, non focal EXT: No edema or tenderness        Data Reviewed:   I have personally reviewed following labs and imaging studies:  Labs: Labs show the following:   Basic Metabolic Panel: Recent Labs  Lab 09/14/23 0343 09/15/23 0640 09/17/23 0714 09/18/23 0705 09/19/23 0302 09/20/23 0352  NA 137 137 136 140 141 140  K 3.7 4.8 4.6 4.7 4.4 4.4  CL 102 105 104 111 111 107  CO2 23 23 23 22 24 23   GLUCOSE 124* 110* 103* 112* 134* 113*  BUN 30* 24* 18 21 27* 25*  CREATININE 0.47 0.57 0.61 0.78 0.58 0.68  CALCIUM  8.3* 8.5* 8.7* 8.6* 8.6* 8.9  MG 1.8 2.4  --   --   --   --    GFR Estimated Creatinine Clearance: 39.2 mL/min (by C-G formula based on SCr of 0.68 mg/dL). Liver Function Tests: No results for input(s): AST, ALT, ALKPHOS, BILITOT,  PROT, ALBUMIN in the last 168 hours. No results for input(s): LIPASE, AMYLASE in the last 168 hours. No results for input(s): AMMONIA in the last 168 hours. Coagulation profile No results for input(s): INR, PROTIME in the last 168 hours.  CBC: Recent Labs  Lab 09/16/23 0428 09/17/23 0714 09/18/23 0705 09/19/23 0302 09/20/23 0352  WBC 13.8* 15.3* 16.0* 15.8* 18.5*  HGB 11.6* 11.4* 11.1* 10.7* 11.5*  HCT  35.9* 35.0* 33.3* 32.3* 35.4*  MCV 90.0 89.1 88.8 89.2 90.3  PLT 251 295 300 275 271   Cardiac Enzymes: No results for input(s): CKTOTAL, CKMB, CKMBINDEX, TROPONINI in the last 168 hours. BNP (last 3 results) No results for input(s): PROBNP in the last 8760 hours. CBG: No results for input(s): GLUCAP in the last 168 hours. D-Dimer: No results for input(s): DDIMER in the last 72 hours. Hgb A1c: No results for input(s): HGBA1C in the last 72 hours. Lipid Profile: No results for input(s): CHOL, HDL, LDLCALC, TRIG, CHOLHDL, LDLDIRECT in the last 72 hours. Thyroid  function studies: No results for input(s): TSH, T4TOTAL, T3FREE, THYROIDAB in the last 72 hours.  Invalid input(s): FREET3 Anemia work up: No results for input(s): VITAMINB12, FOLATE, FERRITIN, TIBC, IRON , RETICCTPCT in the last 72 hours. Sepsis Labs: Recent Labs  Lab 09/17/23 0714 09/18/23 0705 09/19/23 0302 09/20/23 0352  WBC 15.3* 16.0* 15.8* 18.5*    Microbiology Recent Results (from the past 240 hours)  Surgical PCR screen     Status: None   Collection Time: 09/17/23  8:00 PM   Specimen: Nasal Mucosa; Nasal Swab  Result Value Ref Range Status   MRSA, PCR NEGATIVE NEGATIVE Final   Staphylococcus aureus NEGATIVE NEGATIVE Final    Comment: (NOTE) The Xpert SA Assay (FDA approved for NASAL specimens in patients 76 years of age and older), is one component of a comprehensive surveillance program. It is not intended to diagnose infection nor  to guide or monitor treatment. Performed at [facility], [address] [place], [place] [address]     Procedures and diagnostic studies:  No results found.             LOS: 8 days   Stefana Lodico  [facility]   Pager on [email or web address]. If 7PM-7AM, please contact night-coverage at [email or web address]     09/20/2023, 2:45 PM

## 2023-09-20 NOTE — Plan of Care (Signed)
  Problem: Education: Goal: Knowledge of General Education information will improve Description: Including pain rating scale, medication(s)/side effects and non-pharmacologic comfort measures 09/20/2023 1740 by Gwenn Ronal Niemann, RN Outcome: Progressing 09/20/2023 1739 by Gwenn Ronal Niemann, RN Outcome: Progressing   Problem: Health Behavior/Discharge Planning: Goal: Ability to manage health-related needs will improve 09/20/2023 1740 by Gwenn Ronal Niemann, RN Outcome: Progressing 09/20/2023 1739 by Gwenn Ronal Niemann, RN Outcome: Progressing   Problem: Clinical Measurements: Goal: Ability to maintain clinical measurements within normal limits will improve 09/20/2023 1740 by Gwenn Ronal Niemann, RN Outcome: Progressing 09/20/2023 1739 by Gwenn Ronal Niemann, RN Outcome: Progressing Goal: Will remain free from infection 09/20/2023 1740 by Gwenn Ronal Niemann, RN Outcome: Progressing 09/20/2023 1739 by Gwenn Ronal Niemann, RN Outcome: Progressing Goal: Diagnostic test results will improve 09/20/2023 1740 by Gwenn Ronal Niemann, RN Outcome: Progressing 09/20/2023 1739 by Gwenn Ronal Niemann, RN Outcome: Progressing Goal: Respiratory complications will improve 09/20/2023 1740 by Gwenn Ronal Niemann, RN Outcome: Progressing 09/20/2023 1739 by Gwenn Ronal Niemann, RN Outcome: Progressing Goal: Cardiovascular complication will be avoided 09/20/2023 1740 by Gwenn Ronal Niemann, RN Outcome: Progressing 09/20/2023 1739 by Gwenn Ronal Niemann, RN Outcome: Progressing   Problem: Activity: Goal: Risk for activity intolerance will decrease 09/20/2023 1740 by Gwenn Ronal Niemann, RN Outcome: Progressing 09/20/2023 1739 by Gwenn Ronal Niemann, RN Outcome: Progressing   Problem: Nutrition: Goal: Adequate nutrition will be maintained 09/20/2023 1740 by Gwenn Ronal Niemann, RN Outcome: Progressing 09/20/2023 1739 by Gwenn Ronal Niemann, RN Outcome:  Progressing   Problem: Coping: Goal: Level of anxiety will decrease 09/20/2023 1740 by Gwenn Ronal Niemann, RN Outcome: Progressing 09/20/2023 1739 by Gwenn Ronal Niemann, RN Outcome: Progressing   Problem: Elimination: Goal: Will not experience complications related to bowel motility 09/20/2023 1740 by Gwenn Ronal Niemann, RN Outcome: Progressing 09/20/2023 1739 by Gwenn Ronal Niemann, RN Outcome: Progressing Goal: Will not experience complications related to urinary retention 09/20/2023 1740 by Gwenn Ronal Niemann, RN Outcome: Progressing 09/20/2023 1739 by Gwenn Ronal Niemann, RN Outcome: Progressing   Problem: Pain Managment: Goal: General experience of comfort will improve and/or be controlled 09/20/2023 1740 by Gwenn Ronal Niemann, RN Outcome: Progressing 09/20/2023 1739 by Gwenn Ronal Niemann, RN Outcome: Progressing   Problem: Safety: Goal: Ability to remain free from injury will improve 09/20/2023 1740 by Gwenn Ronal Niemann, RN Outcome: Progressing 09/20/2023 1739 by Gwenn Ronal Niemann, RN Outcome: Progressing   Problem: Skin Integrity: Goal: Risk for impaired skin integrity will decrease 09/20/2023 1740 by Gwenn Ronal Niemann, RN Outcome: Progressing 09/20/2023 1739 by Gwenn Ronal Niemann, RN Outcome: Progressing   Problem: Education: Goal: Knowledge of cardiac device and self-care will improve 09/20/2023 1740 by Gwenn Ronal Niemann, RN Outcome: Progressing 09/20/2023 1739 by Gwenn Ronal Niemann, RN Outcome: Progressing Goal: Ability to safely manage health related needs after discharge will improve 09/20/2023 1740 by Gwenn Ronal Niemann, RN Outcome: Progressing 09/20/2023 1739 by Gwenn Ronal Niemann, RN Outcome: Progressing Goal: Individualized Educational Video(s) 09/20/2023 1740 by Gwenn Ronal Niemann, RN Outcome: Progressing 09/20/2023 1739 by Gwenn Ronal Niemann, RN Outcome: Progressing   Problem: Cardiac: Goal: Ability to  achieve and maintain adequate cardiopulmonary perfusion will improve 09/20/2023 1740 by Gwenn Ronal Niemann, RN Outcome: Progressing 09/20/2023 1739 by Gwenn Ronal Niemann, RN Outcome: Progressing

## 2023-09-20 NOTE — Plan of Care (Signed)
                                                     Palliative Care Progress Note   Patient Name: CIRIA BERNARDINI       Date: 09/20/2023 DOB: 03/12/1933  Age: 89 y.o. MRN#: [id number] Attending Physician: Jens Durand, MD Primary Care Physician: Cleotilde Oneil FALCON, MD Admit Date: 09/12/2023  Extensive chart review completed including labs, vital signs, imaging, progress notes, orders, and available advanced directive documents from current and previous encounters.   Plan remains for patient to transfer to SNF for rehab.  TOC following closely for discharge planning.  Symptom burden remains low.  No acute palliative needs at this time.  PMT will continue to monitor patient peripherally and reengage when appropriate.  Thank you for allowing the Palliative Medicine Team to assist in the care of [facility].  Lamarr L. Arvid, DNP, FNP-BC Palliative Medicine Team    No charge

## 2023-09-20 NOTE — TOC Progression Note (Addendum)
 Transition of Care [facility]) - Progression Note    Patient Details  Name: [name] MRN: [id number] Date of Birth: [birth year]  Transition of [facility]) CM/SW Contact  Lauraine JAYSON Carpen, LCSW Phone Number: 09/20/2023, 10:52 AM  Clinical Narrative:  Left voicemail for [facility] admissions coordinator to see if patient would have copays if she returned.   11:06 am: Received call back from [facility] admissions coordinator. He stated patient would come in on day 18 and would have a $50 per day copay starting on day 21. He discussed this with daughter yesterday and said she is agreeable.  12:32 pm: CSW met with patient, husband, and daughter. They have accepted the bed offer from [facility]. Currently, PT feels like patient can safely transport by private vehicle. Husband will transport if she is still safe to do so at discharge. CSW asked MD to notify when getting close to discharge so insurance authorization can be started.  3:31 pm: CSW started insurance authorization.    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[place])                   DME Agency: Other - Comment (Unknown)                   Social Determinants of Health (SDOH) Interventions SDOH Screenings   Food Insecurity: No Food Insecurity (09/14/2023)  Housing: Low Risk  (09/14/2023)  Transportation Needs: No Transportation Needs (09/14/2023)  Utilities: Not At Risk (09/14/2023)  Alcohol Screen: Low Risk  (02/17/2021)  Depression (PHQ2-9): Medium Risk (11/05/2021)  Financial Resource Strain: Low Risk  (08/10/2023)   Received from [facility]  Physical Activity: Sufficiently Active (02/17/2021)  Social Connections: Moderately Isolated (09/14/2023)  Stress: No Stress Concern Present (02/17/2021)  Tobacco Use: Low Risk   (09/14/2023)  Recent Concern: Tobacco Use - Medium Risk (08/10/2023)   Received from [facility]    Readmission Risk Interventions     No data to display

## 2023-09-20 NOTE — Progress Notes (Signed)
 Occupational Therapy Treatment Patient Details Name: [name] MRN: [id number] DOB: 1934/02/09 Today's Date: 09/20/2023   History of present illness Pt is an 89 y.o. female  with past medical history of HTN, HLD, HFpEF, A-fib on Pradaxa , COPD, bronchiectasis, GERD, breast cancer s/p of a left lumpectomy), Psoriatec arthritis, mild pulmonary hypertension, recent right hip fracture  admitted on 09/12/2023 with weakness and poor PO intake, workup for complete heart block and symptomatic bradycardia, afib, COPD. now s/p pacemaker 09/18/2023.   OT comments  Ms Lamontagne seen for OT treatment on this date. Upon arrival to room pt in bed, agreeable to tx. Pt requires MIN A for bed mobility; MIN A for STS, and CGA for step pivot to BSC. Pt ambulated ~30 ft requiring CGA + RW before fatiguing and declining further mobility. Pt making good progress toward goals, will continue to follow POC. Discharge recommendation remains appropriate.        If plan is discharge home, recommend the following:  Assist for transportation;Help with stairs or ramp for entrance;Two people to help with walking and/or transfers;Two people to help with bathing/dressing/bathroom   Equipment Recommendations  Other (comment)    Recommendations for Other Services      Precautions / Restrictions Precautions Precautions: Fall Recall of Precautions/Restrictions: Intact Restrictions Weight Bearing Restrictions Per Provider Order: Yes RLE Weight Bearing Per Provider Order: Weight bearing as tolerated       Mobility Bed Mobility Overal bed mobility: Needs Assistance Bed Mobility: Supine to Sit, Sit to Supine     Supine to sit: Min assist, HOB elevated Sit to supine: Min assist (for BLE management)        Transfers Overall transfer level: Needs assistance Equipment used: Rolling walker (2 wheels) Transfers: Sit to/from Stand, Bed to chair/wheelchair/BSC Sit to Stand: Min assist Stand pivot transfers: Contact guard  assist               Balance Overall balance assessment: Needs assistance Sitting-balance support: Feet supported, No upper extremity supported Sitting balance-Leahy Scale: Fair     Standing balance support: Bilateral upper extremity supported, Reliant on assistive device for balance Standing balance-Leahy Scale: Fair                             ADL either performed or assessed with clinical judgement   ADL Overall ADL's : Needs assistance/impaired                                       General ADL Comments: CGA for BSC t/f; MOD A for bedlevel gown change    Extremity/Trunk Assessment Upper Extremity Assessment Upper Extremity Assessment: Generalized weakness   Lower Extremity Assessment Lower Extremity Assessment: Generalized weakness        Vision       Perception     Praxis     Communication Communication Communication: No apparent difficulties   Cognition Arousal: Alert Behavior During Therapy: WFL for tasks assessed/performed Cognition: No apparent impairments                               Following commands: Intact        Cueing   Cueing Techniques: Verbal cues, Gestural cues  Exercises      Shoulder Instructions       General Comments  Pertinent Vitals/ Pain       Pain Assessment Pain Assessment: No/denies pain  Home Living                                          Prior Functioning/Environment              Frequency  Min 2X/week        Progress Toward Goals  OT Goals(current goals can now be found in the care plan section)  Progress towards OT goals: Progressing toward goals  Acute Rehab OT Goals Patient Stated Goal: to feel stronger OT Goal Formulation: With patient/family Time For Goal Achievement: 10/04/23 Potential to Achieve Goals: Good ADL Goals Pt Will Perform Grooming: sitting;standing;with set-up Pt Will Perform Lower Body Dressing:  sitting/lateral leans;sit to/from stand;with supervision Pt Will Transfer to Toilet: with contact guard assist;stand pivot transfer;bedside commode  Plan      Co-evaluation                 AM-PAC OT 6 Clicks Daily Activity     Outcome Measure   Help from another person eating meals?: None Help from another person taking care of personal grooming?: None Help from another person toileting, which includes using toliet, bedpan, or urinal?: A Little Help from another person bathing (including washing, rinsing, drying)?: A Lot Help from another person to put on and taking off regular upper body clothing?: A Little Help from another person to put on and taking off regular lower body clothing?: A Lot 6 Click Score: 18    End of Session Equipment Utilized During Treatment: Gait belt;Rolling walker (2 wheels)  OT Visit Diagnosis: Unsteadiness on feet (R26.81);Other abnormalities of gait and mobility (R26.89);Muscle weakness (generalized) (M62.81)   Activity Tolerance Patient tolerated treatment well   Patient Left in bed;with call bell/phone within reach;with bed alarm set;with family/visitor present   Nurse Communication          Time: 8496-8476 OT Time Calculation (min): 20 min  Charges: OT General Charges $OT Visit: 1 Visit OT Treatments $Self Care/Home Management : 8-22 mins  [place], Student OT   [employer] 09/20/2023, 3:49 PM

## 2023-09-20 NOTE — Progress Notes (Signed)
 Nutrition Follow-up  DOCUMENTATION CODES:   Non-severe (moderate) malnutrition in context of chronic illness  INTERVENTION:   -Liberalize diet to 2 gram sodium for wider variety of meal selections -Continue Ensure Plus High Protein po BID, each supplement provides 350 kcal and 20 grams of protein  -Continue MVI with minerals daily  NUTRITION DIAGNOSIS:   Moderate Malnutrition related to chronic illness (CHF) as evidenced by mild fat depletion, moderate fat depletion, mild muscle depletion, moderate muscle depletion.  Ongoing  GOAL:   Patient will meet greater than or equal to 90% of their needs  Progressing   MONITOR:   PO intake, Supplement acceptance  REASON FOR ASSESSMENT:   Consult Assessment of nutrition requirement/status  ASSESSMENT:   Pt with medical history significant of HTN, HLD, HFpEF, A-fib on Pradaxa , COPD, bronchiectasis, GERD, breast cancer s/p of a left lumpectomy), Psoriatec arthritis, mild pulmonary hypertension, recent right hip fracture, who presents with weakness.  7/14- s/p pacemaker implantation  Reviewed I/O's: +720 ml x 24 hours and +3.3 L since admission  Pt receiving nursing care at time of visit.   Pt with improved oral intake. Noted meal completions 30-100%. Pt drinking Ensure supplements.   Reviewed wt hx; pt has experienced a 20% wt loss since admission. RD questions accuracy of admission weights. RD will continue to follow weight trends.   Per TOC notes, plan to discharge to SNF once medically stable.   Medications reviewed and include decadron , folic acid , ativan , reglan , remeron , and protonix .   Labs reviewed.    Diet Order:   Diet Order             Diet Heart Room service appropriate? Yes; Fluid consistency: Thin  Diet effective now                   EDUCATION NEEDS:   No education needs have been identified at this time  Skin:  Skin Assessment: Skin Integrity Issues: Skin Integrity Issues::  Incisions Incisions: closed rt hip  Last BM:  09/20/23 (type 2)  Height:   Ht Readings from Last 1 Encounters:  09/12/23 5' 2 (1.575 m)    Weight:   Wt Readings from Last 1 Encounters:  09/20/23 50.2 kg    Ideal Body Weight:  50 kg  BMI:  Body mass index is 20.25 kg/m.  Estimated Nutritional Needs:   Kcal:  1500-1700  Protein:  75-90 grams  Fluid:  1.5-1.7 L    Margery ORN, RD, LDN, CDCES Registered Dietitian III Certified Diabetes Care and Education Specialist If unable to reach this RD, please use RD Inpatient group chat on secure chat between hours of 8am-4 pm daily

## 2023-09-21 DIAGNOSIS — I442 Atrioventricular block, complete: Secondary | ICD-10-CM | POA: Diagnosis not present

## 2023-09-21 MED ORDER — FUROSEMIDE 40 MG PO TABS: 40.0000 mg | ORAL_TABLET | Freq: Every day | ORAL | Status: AC

## 2023-09-21 MED ORDER — LOSARTAN POTASSIUM 50 MG PO TABS: 50.0000 mg | ORAL_TABLET | Freq: Every day | ORAL | Status: AC

## 2023-09-21 MED ORDER — ALBUTEROL SULFATE HFA 108 (90 BASE) MCG/ACT IN AERS: 2.0000 | INHALATION_SPRAY | Freq: Four times a day (QID) | RESPIRATORY_TRACT | Status: AC | PRN

## 2023-09-21 MED ORDER — HYDROCHLOROTHIAZIDE 25 MG PO TABS
25.0000 mg | ORAL_TABLET | Freq: Every day | ORAL | Status: DC
  Administered 2023-09-21: 25 mg via ORAL
  Filled 2023-09-21: qty 1

## 2023-09-21 MED ORDER — METOCLOPRAMIDE HCL 10 MG PO TABS
10.0000 mg | ORAL_TABLET | Freq: Three times a day (TID) | ORAL | Status: AC | PRN
Start: 2023-09-21 — End: ?

## 2023-09-21 MED ORDER — ALBUTEROL SULFATE (2.5 MG/3ML) 0.083% IN NEBU: 2.5000 mg | INHALATION_SOLUTION | Freq: Four times a day (QID) | RESPIRATORY_TRACT | Status: AC | PRN

## 2023-09-21 MED ORDER — METOPROLOL TARTRATE 50 MG PO TABS: 50.0000 mg | ORAL_TABLET | Freq: Two times a day (BID) | ORAL | Status: AC

## 2023-09-21 MED ORDER — LORAZEPAM 0.5 MG PO TABS: 0.5000 mg | ORAL_TABLET | Freq: Every evening | ORAL | 0 refills | Status: AC | PRN

## 2023-09-21 MED ORDER — LORAZEPAM 0.5 MG PO TABS: 0.5000 mg | ORAL_TABLET | Freq: Every evening | ORAL | 0 refills | Status: DC | PRN

## 2023-09-21 NOTE — Plan of Care (Signed)
  Problem: Education: Goal: Knowledge of General Education information will improve Description: Including pain rating scale, medication(s)/side effects and non-pharmacologic comfort measures Outcome: Adequate for Discharge   Problem: Health Behavior/Discharge Planning: Goal: Ability to manage health-related needs will improve Outcome: Adequate for Discharge   Problem: Clinical Measurements: Goal: Ability to maintain clinical measurements within normal limits will improve Outcome: Adequate for Discharge Goal: Will remain free from infection Outcome: Adequate for Discharge Goal: Diagnostic test results will improve Outcome: Adequate for Discharge Goal: Respiratory complications will improve Outcome: Adequate for Discharge Goal: Cardiovascular complication will be avoided Outcome: Adequate for Discharge   Problem: Activity: Goal: Risk for activity intolerance will decrease Outcome: Adequate for Discharge   Problem: Nutrition: Goal: Adequate nutrition will be maintained Outcome: Adequate for Discharge   Problem: Coping: Goal: Level of anxiety will decrease Outcome: Adequate for Discharge   Problem: Elimination: Goal: Will not experience complications related to bowel motility Outcome: Adequate for Discharge Goal: Will not experience complications related to urinary retention Outcome: Adequate for Discharge   Problem: Pain Managment: Goal: General experience of comfort will improve and/or be controlled Outcome: Adequate for Discharge   Problem: Safety: Goal: Ability to remain free from injury will improve Outcome: Adequate for Discharge   Problem: Skin Integrity: Goal: Risk for impaired skin integrity will decrease Outcome: Adequate for Discharge   Problem: Education: Goal: Knowledge of cardiac device and self-care will improve Outcome: Adequate for Discharge Goal: Ability to safely manage health related needs after discharge will improve Outcome: Adequate for  Discharge Goal: Individualized Educational Video(s) Outcome: Adequate for Discharge   Problem: Cardiac: Goal: Ability to achieve and maintain adequate cardiopulmonary perfusion will improve Outcome: Adequate for Discharge

## 2023-09-21 NOTE — Discharge Summary (Addendum)
 Physician Discharge Summary   Patient: [name] MRN: [id number] DOB: 1933-12-04  Admit date:     09/12/2023  Discharge date: 09/21/23  Discharge Physician: AIDA CHO   PCP: Cleotilde Oneil FALCON, MD   Recommendations at discharge:   Follow-up with your physician in the next within 3 days of discharge Follow-up with Dr. Wilburn, cardiologist, on 10/02/2023 as scheduled Repeat BMP and CBC within 1 week of discharge  Discharge Diagnoses: Principal Problem:   Complete heart block (HCC) Active Problems:   Atrial fibrillation, chronic (HCC)   Myocardial injury   (HFpEF) heart failure with preserved ejection fraction (HCC)   Primary hypertension   Leukocytosis   Bronchiectasis (HCC)   Chronic obstructive pulmonary disease (HCC)   Psoriasis vulgaris   Psoriatic arthritis (HCC)   Symptomatic bradycardia   Malnutrition of moderate degree   Anorexia   Nausea and vomiting  Resolved Problems:   * No resolved hospital problems. *  Hospital Course:  [name] is a 89 y.o. female  with medical history significant of HTN, HLD, HFpEF, A-fib on Pradaxa , COPD, bronchiectasis, GERD, breast cancer s/p of a left lumpectomy), Psoriatec arthritis, mild pulmonary hypertension, recent right hip fracture, who presented to the hospital with general weakness, poor oral intake and poor appetite.  According to her husband, her heart rate was in the 40s at home.   Patient was recently hospitalized from 6/11 - 6/16 due to mechanical fall and right hip fracture. Patient was s/p of right hip surgery. Patient states that her surgical site has been healing well.      Patient was found to have complete heart block on EKG with heart rate in the 40s in the emergency department.  She was on amiodarone  and metoprolol  for A-fib.   Status post permanent pacemaker implantation on 09/18/2023.  She tolerated this well.    Assessment and Plan:  High degree heart block Symptomatic bradycardia S/p pacemaker  placement on 09/18/2023 Heart rate is stable. Continue metoprolol ..  Cardiology recommending holding amiodarone  indefinitely. Continue Pradaxa .     Chronic A-fib: Continue metoprolol  and Pradaxa      Myocardial injury:  Troponin peaked at 21.  Suspect demand ischemia.     (HFpEF) heart failure with preserved ejection fraction (HCC):  Compensated 2D echo on 05/19/2023 showed EF 60 to 65% with grade 2 diastolic dysfunction.   She is starting to develop lateral leg swelling.  Will restart Lasix  because she is at risk for fluid overload/decompensated heart failure. Monitor electrolytes/BMP closely while on diuretics   Primary hypertension Continue losartan  and metoprolol  Cardiologist recommended holding HCTZ because of recent hypokalemia     Leukocytosis:  No source infection identified so far.   Appears to be chronic issue . She had also been on IV dexamethasone  which could be contributing to her leukocytosis.  Monitor CBC as an outpatient No indication for antibiotics at this time     Bronchiectasis [facility]) and Chronic obstructive pulmonary disease (HCC):  stable.   - Continue bronchodilators and as needed Mucinex      Hx of psoriasis vulgaris and psoriatic arthritis (HCC): - Patient is on weekly methotrexate  (every Sunday).  Defer to outpatient   Dry heaving Poor p.o. intake Intractable nausea Improved Continue Protonix  and Reglan  as needed      Her condition has improved and she is deemed stable for discharge to SNF today.  Discharge plan was discussed with her husband at the bedside.         Pain control -  [place]  Controlled Substance Reporting System database was reviewed. and patient was instructed, not to drive, operate heavy machinery, perform activities at heights, swimming or participation in water activities or provide baby-sitting services while on Pain, Sleep and Anxiety Medications; until their outpatient Physician has advised to do so again. Also  recommended to not to take more than prescribed Pain, Sleep and Anxiety Medications.  Consultants: Cardiologist Procedures performed: Pacemaker placement on 09/18/2023   Disposition: Skilled nursing facility Diet recommendation:  Discharge Diet Orders (From admission, onward)     Start     Ordered   09/21/23 0000  Diet - low sodium heart healthy        09/21/23 1216           Cardiac diet DISCHARGE MEDICATION: Allergies as of 09/21/2023       Reactions   Amoxicillin  Nausea Only   Eliquis  [apixaban ] Nausea Only        Medication List     STOP taking these medications    amiodarone  200 MG tablet Commonly known as: PACERONE    hydrochlorothiazide  25 MG tablet Commonly known as: HYDRODIURIL    HYDROcodone -acetaminophen  5-325 MG tablet Commonly known as: NORCO/VICODIN       TAKE these medications    albuterol  (2.5 MG/3ML) 0.083% nebulizer solution Commonly known as: PROVENTIL  Inhale 3 mLs (2.5 mg total) into the lungs every 6 (six) hours as needed for wheezing or shortness of breath.   albuterol  108 (90 Base) MCG/ACT inhaler Commonly known as: VENTOLIN  HFA Inhale 2 puffs into the lungs every 6 (six) hours as needed for shortness of breath or wheezing.   dabigatran  150 MG Caps capsule Commonly known as: PRADAXA  Take 1 capsule (150 mg total) by mouth every 12 (twelve) hours.   docusate sodium  100 MG capsule Commonly known as: COLACE Take 1 capsule (100 mg total) by mouth 2 (two) times daily.   folic acid  1 MG tablet Commonly known as: FOLVITE  Take 1 mg by mouth daily. Pt takes this med the days she does not take methotrexate    furosemide  40 MG tablet Commonly known as: LASIX  Take 1 tablet (40 mg total) by mouth daily.   LORazepam  0.5 MG tablet Commonly known as: ATIVAN  Take 1 tablet (0.5 mg total) by mouth at bedtime.   losartan  50 MG tablet Commonly known as: COZAAR  Take 1 tablet (50 mg total) by mouth daily. Start taking on: September 22, 2023    methotrexate  2.5 MG tablet Commonly known as: RHEUMATREX Take 5 mg by mouth once a week. Caution:Chemotherapy. Protect from light. Pt take 2 weekly.   metoCLOPramide  10 MG tablet Commonly known as: REGLAN  Take 1 tablet (10 mg total) by mouth every 8 (eight) hours as needed for nausea or vomiting. What changed:  when to take this reasons to take this   metoprolol  tartrate 50 MG tablet Commonly known as: LOPRESSOR  Take 1 tablet (50 mg total) by mouth 2 (two) times daily. What changed:  medication strength how much to take   omeprazole  40 MG capsule Commonly known as: PRILOSEC Take 40 mg by mouth daily.   ondansetron  4 MG tablet Commonly known as: ZOFRAN  Take 4 mg by mouth every 8 (eight) hours as needed for nausea or vomiting.   Trelegy Ellipta 100-62.5-25 MCG/ACT Aepb Generic drug: Fluticasone -Umeclidin-Vilant Inhale 1 puff into the lungs daily.        Contact information for follow-up providers     Alluri, Krishna C, MD. Go to.   Specialty: Cardiology Why: 07/28 at  8 AM Contact information: [address] [place] [address] [phone]              Contact information for after-discharge care     Destination     [facility] .   Service: Skilled Nursing Contact information: [address] [place]  [address] [phone]                    Discharge Exam: Filed Weights   09/19/23 0511 09/20/23 0501 09/21/23 0500  Weight: 51.4 kg 50.2 kg 51.7 kg   GEN: NAD SKIN: Warm and dry EYES: No pallor or icterus ENT: MMM CV: RRR PULM: CTA B ABD: soft, ND, NT, +BS CNS: AAO x 3, non focal EXT: Bilateral distal leg and pedal edema.  No erythema or tenderness   Condition at discharge: good  The results of significant diagnostics from this hospitalization (including imaging, microbiology, ancillary and laboratory) are listed below for reference.   Imaging Studies: EP PPM/ICD IMPLANT Result  Date: 09/18/2023 Successful Medtronic Micra AV 2 implantation   DG HIP PORT UNILAT WITH PELVIS 1V RIGHT Result Date: 09/15/2023 CLINICAL DATA:  Right hip pain, postop 1 month EXAM: DG HIP (WITH OR WITHOUT PELVIS) 1V PORT RIGHT COMPARISON:  Radiographs 08/16/2023 FINDINGS: Screw fixation of both femoral necks. No radiographic evidence of loosening. No acute fracture or dislocation. Degenerative changes pubic symphysis, both hips, SI joints and lower lumbar spine. IMPRESSION: Screw fixation of both femoral necks. No radiographic evidence of loosening. No acute fracture. Electronically Signed   By: Norman Gatlin M.D.   On: 09/15/2023 19:46   ECHOCARDIOGRAM COMPLETE Result Date: 09/15/2023    ECHOCARDIOGRAM REPORT   Patient Name:   [name] Date of Exam: 09/15/2023 Medical Rec #:  [id number]     Height:       62.0 in Accession #:    [id number]    Weight:       137.8 lb Date of Birth:  [birth year]     BSA:          1.632 m Patient Age:    89 years      BP:           167/48 mmHg Patient Gender: F             HR:           42 bpm. Exam Location:  [facility] Procedure: 2D Echo, Cardiac Doppler and Color Doppler (Both Spectral and Color            Flow Doppler were utilized during procedure). Indications:     Abnormal ECG R94.31  History:         Patient has prior history of Echocardiogram examinations, most                  recent 05/19/2023. Arrythmias:Atrial Fibrillation; Risk                  Factors:Hypertension.  Sonographer:     Christopher Furnace Referring Phys:  [id number] GABRIELLA DECOSTE Diagnosing Phys: Keller Alluri IMPRESSIONS  1. Left ventricular ejection fraction, by estimation, is 70 to 75%. The left ventricle has hyperdynamic function. The left ventricle has no regional wall motion abnormalities. There is mild left ventricular hypertrophy. Left ventricular diastolic parameters are indeterminate.  2. Right ventricular systolic function is normal. The right ventricular size is normal. There is severely elevated  pulmonary artery systolic pressure. The estimated right ventricular systolic  pressure is 63.7 mmHg.  3. Trivial mitral valve regurgitation. Mild mitral stenosis. Severe mitral annular calcification.  4. The aortic valve is tricuspid. Aortic valve regurgitation is not visualized. FINDINGS  Left Ventricle: Left ventricular ejection fraction, by estimation, is 70 to 75%. The left ventricle has hyperdynamic function. The left ventricle has no regional wall motion abnormalities. The left ventricular internal cavity size was normal in size. There is mild left ventricular hypertrophy. Left ventricular diastolic parameters are indeterminate. Right Ventricle: The right ventricular size is normal. No increase in right ventricular wall thickness. Right ventricular systolic function is normal. There is severely elevated pulmonary artery systolic pressure. The tricuspid regurgitant velocity is 3.83 m/s, and with an assumed right atrial pressure of 5 mmHg, the estimated right ventricular systolic pressure is 63.7 mmHg. Left Atrium: Left atrial size was normal in size. Right Atrium: Right atrial size was normal in size. Pericardium: There is no evidence of pericardial effusion. Mitral Valve: There is mild thickening of the mitral valve leaflet(s). Severe mitral annular calcification. Trivial mitral valve regurgitation. Mild mitral valve stenosis. MV peak gradient, 16.5 mmHg. The mean mitral valve gradient is 6.0 mmHg. Tricuspid Valve: The tricuspid valve is normal in structure. Tricuspid valve regurgitation is mild. Aortic Valve: The aortic valve is tricuspid. Aortic valve regurgitation is not visualized. Aortic valve mean gradient measures 5.0 mmHg. Aortic valve peak gradient measures 7.8 mmHg. Aortic valve area, by VTI measures 1.81 cm. Pulmonic Valve: The pulmonic valve was not well visualized. Pulmonic valve regurgitation is trivial. Aorta: The aortic root and ascending aorta are structurally normal, with no evidence of  dilitation. Venous: The inferior vena cava was not well visualized. IAS/Shunts: The atrial septum is grossly normal. Additional Comments: There is pleural effusion in the left lateral region.  LEFT VENTRICLE PLAX 2D LVIDd:         3.50 cm   Diastology LVIDs:         2.60 cm   LV e' medial:    4.68 cm/s LV PW:         1.10 cm   LV E/e' medial:  31.6 LV IVS:        1.30 cm   LV e' lateral:   6.74 cm/s LVOT diam:     2.00 cm   LV E/e' lateral: 22.0 LV SV:         54 LV SV Index:   33 LVOT Area:     3.14 cm  RIGHT VENTRICLE RV Basal diam:  2.80 cm RV Mid diam:    2.70 cm LEFT ATRIUM             Index        RIGHT ATRIUM           Index LA diam:        4.40 cm 2.70 cm/m   RA Area:     15.30 cm LA Vol (A2C):   32.7 ml 20.04 ml/m  RA Volume:   40.60 ml  24.88 ml/m LA Vol (A4C):   39.2 ml 24.02 ml/m LA Biplane Vol: 36.2 ml 22.19 ml/m  AORTIC VALVE AV Area (Vmax):    1.61 cm AV Area (Vmean):   1.50 cm AV Area (VTI):     1.81 cm AV Vmax:           140.00 cm/s AV Vmean:          102.350 cm/s AV VTI:            0.298  m AV Peak Grad:      7.8 mmHg AV Mean Grad:      5.0 mmHg LVOT Vmax:         71.90 cm/s LVOT Vmean:        48.900 cm/s LVOT VTI:          0.171 m LVOT/AV VTI ratio: 0.57  AORTA Ao Root diam: 2.50 cm MITRAL VALVE                TRICUSPID VALVE MV Area (PHT): 2.36 cm     TR Peak grad:   58.7 mmHg MV Area VTI:   0.78 cm     TR Vmax:        383.00 cm/s MV Peak grad:  16.5 mmHg MV Mean grad:  6.0 mmHg     SHUNTS MV Vmax:       2.03 m/s     Systemic VTI:  0.17 m MV Vmean:      113.0 cm/s   Systemic Diam: 2.00 cm MV Decel Time: 322 msec MV E velocity: 148.00 cm/s MV A velocity: 121.00 cm/s MV E/A ratio:  1.22 Keller Paterson Electronically signed by Keller Paterson Signature Date/Time: 09/15/2023/1:31:34 PM    Final    DG Chest Portable 1 View Result Date: 09/12/2023 CLINICAL DATA:  Weakness, shortness of breath EXAM: PORTABLE CHEST 1 VIEW COMPARISON:  07/18/2023 FINDINGS: Heart and mediastinal contours within  normal limits. Aortic atherosclerosis. Right lung clear. Patchy opacity in the left lower lung again noted, improved since prior study. No visible effusions or acute bony abnormality. Biapical scarring. IMPRESSION: Persistent but improving left basilar atelectasis or infiltrate. No visible effusions. Electronically Signed   By: Franky Crease M.D.   On: 09/12/2023 19:30    Microbiology: Results for orders placed or performed during the hospital encounter of 09/12/23  Surgical PCR screen     Status: None   Collection Time: 09/17/23  8:00 PM   Specimen: Nasal Mucosa; Nasal Swab  Result Value Ref Range Status   MRSA, PCR NEGATIVE NEGATIVE Final   Staphylococcus aureus NEGATIVE NEGATIVE Final    Comment: (NOTE) The Xpert SA Assay (FDA approved for NASAL specimens in patients 51 years of age and older), is one component of a comprehensive surveillance program. It is not intended to diagnose infection nor to guide or monitor treatment. Performed at [facility], [address] [place], [place] [address]     Labs: CBC: Recent Labs  Lab 09/16/23 [phone] 09/17/23 0714 09/18/23 0705 09/19/23 0302 09/20/23 0352  WBC 13.8* 15.3* 16.0* 15.8* 18.5*  HGB 11.6* 11.4* 11.1* 10.7* 11.5*  HCT 35.9* 35.0* 33.3* 32.3* 35.4*  MCV 90.0 89.1 88.8 89.2 90.3  PLT 251 295 300 275 271   Basic Metabolic Panel: Recent Labs  Lab 09/15/23 0640 09/17/23 0714 09/18/23 0705 09/19/23 0302 09/20/23 0352  NA 137 136 140 141 140  K 4.8 4.6 4.7 4.4 4.4  CL 105 104 111 111 107  CO2 23 23 22 24 23   GLUCOSE 110* 103* 112* 134* 113*  BUN 24* 18 21 27* 25*  CREATININE 0.57 0.61 0.78 0.58 0.68  CALCIUM  8.5* 8.7* 8.6* 8.6* 8.9  MG 2.4  --   --   --   --    Liver Function Tests: No results for input(s): AST, ALT, ALKPHOS, BILITOT, PROT, ALBUMIN in the last 168 hours. CBG: No results for input(s): GLUCAP in the last 168 hours.  Discharge time spent: greater than 30  minutes.  Signed: AIDA CHO, MD [facility] 09/21/2023

## 2023-09-21 NOTE — TOC Transition Note (Signed)
 Transition of Care [facility]) - Discharge Note   Patient Details  Name: [name] MRN: [id number] Date of Birth: 07/10/1933  [facility]) CM/SW Contact:  Lauraine JAYSON Carpen, LCSW Phone Number: 09/21/2023, 1:44 PM   Clinical Narrative:   Patient has orders to discharge to [facility] SNF today. RN will call report to [phone] (Room E-1). Husband will transport. No further concerns. CSW signing off.  Final next level of care: Skilled Nursing Facility Barriers to Discharge: Barriers Resolved   Patient Goals and CMS Choice     Choice offered to / list presented to : Patient, Spouse, Adult Children      Discharge Placement   Existing PASRR number confirmed : 09/17/23          Patient chooses bed at: Other - please specify in the comment section below: ([facility]) Patient to be transferred to facility by: Husband Name of family member notified: [name] and [name] Patient and family notified of of transfer: 09/21/23  Discharge Plan and Services Additional resources added to the After Visit Summary for   In-house Referral: Clinical Social Work                DME Agency: Other - Comment (Unknown)                  Social Drivers of Health (SDOH) Interventions SDOH Screenings   Food Insecurity: No Food Insecurity (09/14/2023)  Housing: Low Risk  (09/14/2023)  Transportation Needs: No Transportation Needs (09/14/2023)  Utilities: Not At Risk (09/14/2023)  Alcohol Screen: Low Risk  (02/17/2021)  Depression (PHQ2-9): Medium Risk (11/05/2021)  Financial Resource Strain: Low Risk  (08/10/2023)   Received from [facility]  Physical Activity: Sufficiently Active (02/17/2021)  Social Connections: Moderately Isolated (09/14/2023)  Stress: No Stress Concern Present (02/17/2021)  Tobacco Use: Low Risk  (09/14/2023)  Recent Concern: Tobacco Use - Medium Risk (08/10/2023)   Received from [facility]      Readmission Risk Interventions     No data to display

## 2023-09-21 NOTE — TOC Progression Note (Addendum)
 Transition of Care [facility]) - Progression Note    Patient Details  Name: [name] MRN: [id number] Date of Birth: [birth year]  [facility]) CM/SW Contact  Lauraine JAYSON Carpen, LCSW Phone Number: 09/21/2023, 8:49 AM  Clinical Narrative:  Shara approved: [id number]. Valid 7/17-7/21. Left message for [facility] admissions coordinator to notify and confirm they can take her today if stable.   10:24 am: [facility] can [facility] patient today if stable. Sent secure chat to MD to notify. Updated patient and husband.    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[place])                   DME Agency: Other - Comment (Unknown)                   Social Determinants of Health (SDOH) Interventions SDOH Screenings   Food Insecurity: No Food Insecurity (09/14/2023)  Housing: Low Risk  (09/14/2023)  Transportation Needs: No Transportation Needs (09/14/2023)  Utilities: Not At Risk (09/14/2023)  Alcohol Screen: Low Risk  (02/17/2021)  Depression (PHQ2-9): Medium Risk (11/05/2021)  Financial Resource Strain: Low Risk  (08/10/2023)   Received from [facility]  Physical Activity: Sufficiently Active (02/17/2021)  Social Connections: Moderately Isolated (09/14/2023)  Stress: No Stress Concern Present (02/17/2021)  Tobacco Use: Low Risk  (09/14/2023)  Recent Concern: Tobacco Use - Medium Risk (08/10/2023)   Received from [facility]    Readmission Risk Interventions     No data to display

## 2023-09-21 NOTE — Progress Notes (Signed)
 Pt/spouse given dc/rx instructions. Pt/spouse voice understanding.Pt assisted in getting dressed. No distress note with pt. Pt with no valuables, only has clothing dressed with.
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
